# Patient Record
Sex: Male | Born: 1960 | Race: White | Hispanic: No | Marital: Married | Smoking: Never smoker
Health system: Southern US, Community
[De-identification: ages and names within clinical notes are randomized; demographics above are authoritative.]

## PROBLEM LIST (undated history)

## (undated) DIAGNOSIS — E7800 Pure hypercholesterolemia, unspecified: Secondary | ICD-10-CM

## (undated) DIAGNOSIS — E119 Type 2 diabetes mellitus without complications: Secondary | ICD-10-CM

## (undated) DIAGNOSIS — S3282XA Multiple fractures of pelvis without disruption of pelvic ring, initial encounter for closed fracture: Secondary | ICD-10-CM

## (undated) HISTORY — PX: HERNIA REPAIR: SHX51

---

## 2017-02-07 ENCOUNTER — Encounter (HOSPITAL_COMMUNITY)
Admission: EM | Disposition: A | Discharge status: Nursing Home (Includes ICF) | Payer: Self-pay | Source: Home / Self Care

## 2017-02-07 ENCOUNTER — Emergency Department (HOSPITAL_COMMUNITY): Payer: BLUE CROSS/BLUE SHIELD

## 2017-02-07 ENCOUNTER — Inpatient Hospital Stay (HOSPITAL_COMMUNITY): Payer: BLUE CROSS/BLUE SHIELD

## 2017-02-07 ENCOUNTER — Inpatient Hospital Stay (HOSPITAL_COMMUNITY): Payer: BLUE CROSS/BLUE SHIELD | Admitting: Critical Care Medicine

## 2017-02-07 ENCOUNTER — Inpatient Hospital Stay (HOSPITAL_COMMUNITY)
Admission: EM | Admit: 2017-02-07 | Discharge: 2017-03-23 | DRG: 003 | Disposition: A | Discharge status: Other Acute Inpatient Hospital | Payer: BLUE CROSS/BLUE SHIELD | Attending: General Surgery | Admitting: General Surgery

## 2017-02-07 ENCOUNTER — Inpatient Hospital Stay (HOSPITAL_COMMUNITY): Payer: BLUE CROSS/BLUE SHIELD | Admitting: Certified Registered"

## 2017-02-07 ENCOUNTER — Encounter (HOSPITAL_COMMUNITY): Payer: Self-pay

## 2017-02-07 DIAGNOSIS — G92 Toxic encephalopathy: Secondary | ICD-10-CM | POA: Diagnosis not present

## 2017-02-07 DIAGNOSIS — E119 Type 2 diabetes mellitus without complications: Secondary | ICD-10-CM

## 2017-02-07 DIAGNOSIS — R000 Tachycardia, unspecified: Secondary | ICD-10-CM | POA: Diagnosis present

## 2017-02-07 DIAGNOSIS — A499 Bacterial infection, unspecified: Secondary | ICD-10-CM | POA: Diagnosis not present

## 2017-02-07 DIAGNOSIS — L8990 Pressure ulcer of unspecified site, unspecified stage: Secondary | ICD-10-CM | POA: Insufficient documentation

## 2017-02-07 DIAGNOSIS — N179 Acute kidney failure, unspecified: Secondary | ICD-10-CM | POA: Diagnosis not present

## 2017-02-07 DIAGNOSIS — S299XXD Unspecified injury of thorax, subsequent encounter: Secondary | ICD-10-CM | POA: Diagnosis not present

## 2017-02-07 DIAGNOSIS — S37031A Laceration of right kidney, unspecified degree, initial encounter: Secondary | ICD-10-CM

## 2017-02-07 DIAGNOSIS — J9690 Respiratory failure, unspecified, unspecified whether with hypoxia or hypercapnia: Secondary | ICD-10-CM

## 2017-02-07 DIAGNOSIS — D62 Acute posthemorrhagic anemia: Secondary | ICD-10-CM | POA: Diagnosis present

## 2017-02-07 DIAGNOSIS — S32810S Multiple fractures of pelvis with stable disruption of pelvic ring, sequela: Secondary | ICD-10-CM | POA: Diagnosis not present

## 2017-02-07 DIAGNOSIS — R338 Other retention of urine: Secondary | ICD-10-CM

## 2017-02-07 DIAGNOSIS — S36031A Moderate laceration of spleen, initial encounter: Secondary | ICD-10-CM | POA: Diagnosis present

## 2017-02-07 DIAGNOSIS — R339 Retention of urine, unspecified: Secondary | ICD-10-CM | POA: Diagnosis not present

## 2017-02-07 DIAGNOSIS — T794XXA Traumatic shock, initial encounter: Secondary | ICD-10-CM

## 2017-02-07 DIAGNOSIS — S32119A Unspecified Zone I fracture of sacrum, initial encounter for closed fracture: Secondary | ICD-10-CM | POA: Diagnosis present

## 2017-02-07 DIAGNOSIS — R509 Fever, unspecified: Secondary | ICD-10-CM

## 2017-02-07 DIAGNOSIS — G8918 Other acute postprocedural pain: Secondary | ICD-10-CM

## 2017-02-07 DIAGNOSIS — I10 Essential (primary) hypertension: Secondary | ICD-10-CM | POA: Diagnosis present

## 2017-02-07 DIAGNOSIS — A419 Sepsis, unspecified organism: Secondary | ICD-10-CM | POA: Diagnosis not present

## 2017-02-07 DIAGNOSIS — J9601 Acute respiratory failure with hypoxia: Secondary | ICD-10-CM | POA: Diagnosis present

## 2017-02-07 DIAGNOSIS — S32049A Unspecified fracture of fourth lumbar vertebra, initial encounter for closed fracture: Secondary | ICD-10-CM | POA: Diagnosis present

## 2017-02-07 DIAGNOSIS — Z931 Gastrostomy status: Secondary | ICD-10-CM | POA: Diagnosis not present

## 2017-02-07 DIAGNOSIS — S2249XA Multiple fractures of ribs, unspecified side, initial encounter for closed fracture: Secondary | ICD-10-CM | POA: Diagnosis not present

## 2017-02-07 DIAGNOSIS — Z7984 Long term (current) use of oral hypoglycemic drugs: Secondary | ICD-10-CM

## 2017-02-07 DIAGNOSIS — S27322A Contusion of lung, bilateral, initial encounter: Secondary | ICD-10-CM | POA: Diagnosis not present

## 2017-02-07 DIAGNOSIS — E870 Hyperosmolality and hypernatremia: Secondary | ICD-10-CM | POA: Diagnosis not present

## 2017-02-07 DIAGNOSIS — J9503 Malfunction of tracheostomy stoma: Secondary | ICD-10-CM | POA: Diagnosis not present

## 2017-02-07 DIAGNOSIS — G8220 Paraplegia, unspecified: Secondary | ICD-10-CM | POA: Diagnosis not present

## 2017-02-07 DIAGNOSIS — Z9289 Personal history of other medical treatment: Secondary | ICD-10-CM

## 2017-02-07 DIAGNOSIS — S22009A Unspecified fracture of unspecified thoracic vertebra, initial encounter for closed fracture: Secondary | ICD-10-CM | POA: Diagnosis not present

## 2017-02-07 DIAGNOSIS — N170 Acute kidney failure with tubular necrosis: Secondary | ICD-10-CM | POA: Diagnosis not present

## 2017-02-07 DIAGNOSIS — Z9911 Dependence on respirator [ventilator] status: Secondary | ICD-10-CM | POA: Diagnosis not present

## 2017-02-07 DIAGNOSIS — E1165 Type 2 diabetes mellitus with hyperglycemia: Secondary | ICD-10-CM | POA: Diagnosis not present

## 2017-02-07 DIAGNOSIS — S069X9A Unspecified intracranial injury with loss of consciousness of unspecified duration, initial encounter: Secondary | ICD-10-CM | POA: Diagnosis not present

## 2017-02-07 DIAGNOSIS — E785 Hyperlipidemia, unspecified: Secondary | ICD-10-CM

## 2017-02-07 DIAGNOSIS — I159 Secondary hypertension, unspecified: Secondary | ICD-10-CM

## 2017-02-07 DIAGNOSIS — Z930 Tracheostomy status: Secondary | ICD-10-CM

## 2017-02-07 DIAGNOSIS — S32810A Multiple fractures of pelvis with stable disruption of pelvic ring, initial encounter for closed fracture: Secondary | ICD-10-CM

## 2017-02-07 DIAGNOSIS — E7800 Pure hypercholesterolemia, unspecified: Secondary | ICD-10-CM | POA: Diagnosis not present

## 2017-02-07 DIAGNOSIS — R571 Hypovolemic shock: Secondary | ICD-10-CM | POA: Diagnosis present

## 2017-02-07 DIAGNOSIS — E875 Hyperkalemia: Secondary | ICD-10-CM

## 2017-02-07 DIAGNOSIS — E1121 Type 2 diabetes mellitus with diabetic nephropathy: Secondary | ICD-10-CM

## 2017-02-07 DIAGNOSIS — S2243XA Multiple fractures of ribs, bilateral, initial encounter for closed fracture: Secondary | ICD-10-CM | POA: Diagnosis present

## 2017-02-07 DIAGNOSIS — R001 Bradycardia, unspecified: Secondary | ICD-10-CM | POA: Diagnosis not present

## 2017-02-07 DIAGNOSIS — J942 Hemothorax: Secondary | ICD-10-CM

## 2017-02-07 DIAGNOSIS — IMO0002 Reserved for concepts with insufficient information to code with codable children: Secondary | ICD-10-CM

## 2017-02-07 DIAGNOSIS — S32059A Unspecified fracture of fifth lumbar vertebra, initial encounter for closed fracture: Secondary | ICD-10-CM | POA: Diagnosis present

## 2017-02-07 DIAGNOSIS — Z9689 Presence of other specified functional implants: Secondary | ICD-10-CM

## 2017-02-07 DIAGNOSIS — T1490XS Injury, unspecified, sequela: Secondary | ICD-10-CM | POA: Diagnosis not present

## 2017-02-07 DIAGNOSIS — R17 Unspecified jaundice: Secondary | ICD-10-CM | POA: Diagnosis not present

## 2017-02-07 DIAGNOSIS — E8889 Other specified metabolic disorders: Secondary | ICD-10-CM | POA: Diagnosis present

## 2017-02-07 DIAGNOSIS — T07XXXA Unspecified multiple injuries, initial encounter: Secondary | ICD-10-CM | POA: Diagnosis not present

## 2017-02-07 DIAGNOSIS — S32009A Unspecified fracture of unspecified lumbar vertebra, initial encounter for closed fracture: Secondary | ICD-10-CM

## 2017-02-07 DIAGNOSIS — S37041A Minor laceration of right kidney, initial encounter: Secondary | ICD-10-CM | POA: Diagnosis present

## 2017-02-07 DIAGNOSIS — S36039A Unspecified laceration of spleen, initial encounter: Secondary | ICD-10-CM

## 2017-02-07 DIAGNOSIS — H518 Other specified disorders of binocular movement: Secondary | ICD-10-CM | POA: Diagnosis not present

## 2017-02-07 DIAGNOSIS — S272XXA Traumatic hemopneumothorax, initial encounter: Secondary | ICD-10-CM | POA: Diagnosis present

## 2017-02-07 DIAGNOSIS — H5500 Unspecified nystagmus: Secondary | ICD-10-CM | POA: Diagnosis not present

## 2017-02-07 DIAGNOSIS — E872 Acidosis: Secondary | ICD-10-CM | POA: Diagnosis not present

## 2017-02-07 DIAGNOSIS — S32029A Unspecified fracture of second lumbar vertebra, initial encounter for closed fracture: Secondary | ICD-10-CM | POA: Diagnosis present

## 2017-02-07 DIAGNOSIS — S334XXA Traumatic rupture of symphysis pubis, initial encounter: Secondary | ICD-10-CM | POA: Diagnosis present

## 2017-02-07 DIAGNOSIS — B961 Klebsiella pneumoniae [K. pneumoniae] as the cause of diseases classified elsewhere: Secondary | ICD-10-CM | POA: Diagnosis not present

## 2017-02-07 DIAGNOSIS — S3730XA Unspecified injury of urethra, initial encounter: Secondary | ICD-10-CM | POA: Diagnosis not present

## 2017-02-07 DIAGNOSIS — R7881 Bacteremia: Secondary | ICD-10-CM | POA: Diagnosis not present

## 2017-02-07 DIAGNOSIS — N501 Vascular disorders of male genital organs: Secondary | ICD-10-CM

## 2017-02-07 DIAGNOSIS — G7281 Critical illness myopathy: Secondary | ICD-10-CM | POA: Diagnosis not present

## 2017-02-07 DIAGNOSIS — L89619 Pressure ulcer of right heel, unspecified stage: Secondary | ICD-10-CM | POA: Diagnosis not present

## 2017-02-07 DIAGNOSIS — L89899 Pressure ulcer of other site, unspecified stage: Secondary | ICD-10-CM | POA: Diagnosis not present

## 2017-02-07 DIAGNOSIS — T148XXA Other injury of unspecified body region, initial encounter: Secondary | ICD-10-CM

## 2017-02-07 DIAGNOSIS — Y9279 Other farm location as the place of occurrence of the external cause: Secondary | ICD-10-CM | POA: Diagnosis not present

## 2017-02-07 DIAGNOSIS — E2749 Other adrenocortical insufficiency: Secondary | ICD-10-CM | POA: Diagnosis present

## 2017-02-07 DIAGNOSIS — R6521 Severe sepsis with septic shock: Secondary | ICD-10-CM | POA: Diagnosis not present

## 2017-02-07 DIAGNOSIS — Z79899 Other long term (current) drug therapy: Secondary | ICD-10-CM

## 2017-02-07 DIAGNOSIS — N360 Urethral fistula: Secondary | ICD-10-CM | POA: Diagnosis not present

## 2017-02-07 DIAGNOSIS — S3282XD Multiple fractures of pelvis without disruption of pelvic ring, subsequent encounter for fracture with routine healing: Secondary | ICD-10-CM | POA: Diagnosis not present

## 2017-02-07 DIAGNOSIS — E559 Vitamin D deficiency, unspecified: Secondary | ICD-10-CM | POA: Diagnosis present

## 2017-02-07 DIAGNOSIS — S32039A Unspecified fracture of third lumbar vertebra, initial encounter for closed fracture: Secondary | ICD-10-CM | POA: Diagnosis present

## 2017-02-07 DIAGNOSIS — J982 Interstitial emphysema: Secondary | ICD-10-CM

## 2017-02-07 DIAGNOSIS — S069X3S Unspecified intracranial injury with loss of consciousness of 1 hour to 5 hours 59 minutes, sequela: Secondary | ICD-10-CM | POA: Diagnosis not present

## 2017-02-07 DIAGNOSIS — B952 Enterococcus as the cause of diseases classified elsewhere: Secondary | ICD-10-CM | POA: Diagnosis not present

## 2017-02-07 DIAGNOSIS — R569 Unspecified convulsions: Secondary | ICD-10-CM | POA: Diagnosis not present

## 2017-02-07 DIAGNOSIS — Z9359 Other cystostomy status: Secondary | ICD-10-CM | POA: Diagnosis not present

## 2017-02-07 DIAGNOSIS — R4182 Altered mental status, unspecified: Secondary | ICD-10-CM | POA: Diagnosis not present

## 2017-02-07 DIAGNOSIS — N5089 Other specified disorders of the male genital organs: Secondary | ICD-10-CM | POA: Diagnosis not present

## 2017-02-07 DIAGNOSIS — N390 Urinary tract infection, site not specified: Secondary | ICD-10-CM | POA: Diagnosis not present

## 2017-02-07 DIAGNOSIS — S299XXA Unspecified injury of thorax, initial encounter: Secondary | ICD-10-CM

## 2017-02-07 DIAGNOSIS — E1169 Type 2 diabetes mellitus with other specified complication: Secondary | ICD-10-CM | POA: Diagnosis present

## 2017-02-07 DIAGNOSIS — Z0189 Encounter for other specified special examinations: Secondary | ICD-10-CM

## 2017-02-07 DIAGNOSIS — S270XXS Traumatic pneumothorax, sequela: Secondary | ICD-10-CM | POA: Diagnosis not present

## 2017-02-07 DIAGNOSIS — S3282XA Multiple fractures of pelvis without disruption of pelvic ring, initial encounter for closed fracture: Secondary | ICD-10-CM | POA: Diagnosis present

## 2017-02-07 DIAGNOSIS — S37091A Other injury of right kidney, initial encounter: Secondary | ICD-10-CM

## 2017-02-07 DIAGNOSIS — Z419 Encounter for procedure for purposes other than remedying health state, unspecified: Secondary | ICD-10-CM

## 2017-02-07 DIAGNOSIS — S332XXA Dislocation of sacroiliac and sacrococcygeal joint, initial encounter: Secondary | ICD-10-CM | POA: Diagnosis present

## 2017-02-07 DIAGNOSIS — J939 Pneumothorax, unspecified: Secondary | ICD-10-CM

## 2017-02-07 DIAGNOSIS — S06303S Unspecified focal traumatic brain injury with loss of consciousness of 1 hour to 5 hours 59 minutes, sequela: Secondary | ICD-10-CM | POA: Diagnosis not present

## 2017-02-07 DIAGNOSIS — R069 Unspecified abnormalities of breathing: Secondary | ICD-10-CM

## 2017-02-07 DIAGNOSIS — S32810D Multiple fractures of pelvis with stable disruption of pelvic ring, subsequent encounter for fracture with routine healing: Secondary | ICD-10-CM | POA: Diagnosis not present

## 2017-02-07 DIAGNOSIS — R0602 Shortness of breath: Secondary | ICD-10-CM

## 2017-02-07 DIAGNOSIS — E876 Hypokalemia: Secondary | ICD-10-CM | POA: Diagnosis not present

## 2017-02-07 DIAGNOSIS — D6489 Other specified anemias: Secondary | ICD-10-CM | POA: Diagnosis not present

## 2017-02-07 DIAGNOSIS — Z23 Encounter for immunization: Secondary | ICD-10-CM | POA: Diagnosis not present

## 2017-02-07 DIAGNOSIS — S22069S Unspecified fracture of T7-T8 vertebra, sequela: Secondary | ICD-10-CM | POA: Diagnosis not present

## 2017-02-07 DIAGNOSIS — Z9889 Other specified postprocedural states: Secondary | ICD-10-CM | POA: Diagnosis not present

## 2017-02-07 DIAGNOSIS — D6959 Other secondary thrombocytopenia: Secondary | ICD-10-CM | POA: Diagnosis not present

## 2017-02-07 DIAGNOSIS — G6281 Critical illness polyneuropathy: Secondary | ICD-10-CM | POA: Diagnosis not present

## 2017-02-07 DIAGNOSIS — J9811 Atelectasis: Secondary | ICD-10-CM | POA: Diagnosis present

## 2017-02-07 DIAGNOSIS — R0902 Hypoxemia: Secondary | ICD-10-CM

## 2017-02-07 DIAGNOSIS — R0682 Tachypnea, not elsewhere classified: Secondary | ICD-10-CM

## 2017-02-07 DIAGNOSIS — Z794 Long term (current) use of insulin: Secondary | ICD-10-CM

## 2017-02-07 DIAGNOSIS — Z938 Other artificial opening status: Secondary | ICD-10-CM

## 2017-02-07 HISTORY — DX: Multiple fractures of pelvis without disruption of pelvic ring, initial encounter for closed fracture: S32.82XA

## 2017-02-07 HISTORY — PX: EXTERNAL FIXATION PELVIS: SHX1551

## 2017-02-07 HISTORY — PX: IR ANGIOGRAM SELECTIVE EACH ADDITIONAL VESSEL: IMG667

## 2017-02-07 HISTORY — PX: IR ANGIOGRAM PELVIS SELECTIVE OR SUPRASELECTIVE: IMG661

## 2017-02-07 HISTORY — DX: Type 2 diabetes mellitus without complications: E11.9

## 2017-02-07 HISTORY — DX: Pure hypercholesterolemia, unspecified: E78.00

## 2017-02-07 HISTORY — PX: RADIOLOGY WITH ANESTHESIA: SHX6223

## 2017-02-07 HISTORY — PX: IR ANGIOGRAM VISCERAL SELECTIVE: IMG657

## 2017-02-07 HISTORY — PX: IR EMBO ART  VEN HEMORR LYMPH EXTRAV  INC GUIDE ROADMAPPING: IMG5450

## 2017-02-07 HISTORY — PX: SACROILIAC JOINT FUSION: SHX6088

## 2017-02-07 HISTORY — PX: IR US GUIDE VASC ACCESS RIGHT: IMG2390

## 2017-02-07 LAB — GLUCOSE, CAPILLARY
GLUCOSE-CAPILLARY: 370 mg/dL — AB (ref 65–99)
GLUCOSE-CAPILLARY: 196 mg/dL — AB (ref 65–99)
GLUCOSE-CAPILLARY: 151 mg/dL — AB (ref 65–99)
GLUCOSE-CAPILLARY: 102 mg/dL — AB (ref 65–99)
Glucose-Capillary: 280 mg/dL — ABNORMAL HIGH (ref 65–99)
Glucose-Capillary: 271 mg/dL — ABNORMAL HIGH (ref 65–99)
Glucose-Capillary: 226 mg/dL — ABNORMAL HIGH (ref 65–99)
Glucose-Capillary: 130 mg/dL — ABNORMAL HIGH (ref 65–99)

## 2017-02-07 LAB — POCT I-STAT 7, (LYTES, BLD GAS, ICA,H+H)
ACID-BASE DEFICIT: 7 mmol/L — AB (ref 0.0–2.0)
Bicarbonate: 19.5 mmol/L — ABNORMAL LOW (ref 20.0–28.0)
CALCIUM ION: 0.97 mmol/L — AB (ref 1.15–1.40)
HCT: 32 % — ABNORMAL LOW (ref 39.0–52.0)
HEMOGLOBIN: 10.9 g/dL — AB (ref 13.0–17.0)
O2 SAT: 94 %
PH ART: 7.276 — AB (ref 7.350–7.450)
PO2 ART: 79 mmHg — AB (ref 83.0–108.0)
Potassium: 3.3 mmol/L — ABNORMAL LOW (ref 3.5–5.1)
SODIUM: 140 mmol/L (ref 135–145)
TCO2: 21 mmol/L (ref 0–100)
pCO2 arterial: 41.9 mmHg (ref 32.0–48.0)

## 2017-02-07 LAB — COMPREHENSIVE METABOLIC PANEL
ALK PHOS: 54 U/L (ref 38–126)
ALT: 277 U/L — ABNORMAL HIGH (ref 17–63)
ANION GAP: 10 (ref 5–15)
AST: 326 U/L — ABNORMAL HIGH (ref 15–41)
Albumin: 3 g/dL — ABNORMAL LOW (ref 3.5–5.0)
BUN: 19 mg/dL (ref 6–20)
CALCIUM: 7.7 mg/dL — AB (ref 8.9–10.3)
CHLORIDE: 103 mmol/L (ref 101–111)
CO2: 22 mmol/L (ref 22–32)
Creatinine, Ser: 1.54 mg/dL — ABNORMAL HIGH (ref 0.61–1.24)
GFR calc non Af Amer: 49 mL/min — ABNORMAL LOW (ref >60)
GFR, EST AFRICAN AMERICAN: 57 mL/min — AB (ref >60)
Glucose, Bld: 496 mg/dL — ABNORMAL HIGH (ref 65–99)
Potassium: 3.3 mmol/L — ABNORMAL LOW (ref 3.5–5.1)
SODIUM: 135 mmol/L (ref 135–145)
Total Bilirubin: 0.8 mg/dL (ref 0.3–1.2)
Total Protein: 4.9 g/dL — ABNORMAL LOW (ref 6.5–8.1)

## 2017-02-07 LAB — BPAM PLATELET PHERESIS
Blood Product Expiration Date: 201808212359
Unit Type and Rh: 6200

## 2017-02-07 LAB — I-STAT CHEM 8, ED
BUN: 24 mg/dL — ABNORMAL HIGH (ref 6–20)
Calcium, Ion: 1.05 mmol/L — ABNORMAL LOW (ref 1.15–1.40)
Chloride: 99 mmol/L — ABNORMAL LOW (ref 101–111)
Creatinine, Ser: 1.3 mg/dL — ABNORMAL HIGH (ref 0.61–1.24)
Glucose, Bld: 501 mg/dL (ref 65–99)
HCT: 33 % — ABNORMAL LOW (ref 39.0–52.0)
Hemoglobin: 11.2 g/dL — ABNORMAL LOW (ref 13.0–17.0)
Potassium: 3.5 mmol/L (ref 3.5–5.1)
Sodium: 138 mmol/L (ref 135–145)
TCO2: 25 mmol/L (ref 0–100)

## 2017-02-07 LAB — URINALYSIS, ROUTINE W REFLEX MICROSCOPIC
BILIRUBIN URINE: NEGATIVE
KETONES UR: NEGATIVE mg/dL
LEUKOCYTES UA: NEGATIVE
NITRITE: NEGATIVE
PH: 6 (ref 5.0–8.0)
Protein, ur: 100 mg/dL — AB
SQUAMOUS EPITHELIAL / LPF: NONE SEEN
Specific Gravity, Urine: 1.014 (ref 1.005–1.030)
WBC, UA: NONE SEEN WBC/hpf (ref 0–5)

## 2017-02-07 LAB — I-STAT ARTERIAL BLOOD GAS, ED
Acid-base deficit: 4 mmol/L — ABNORMAL HIGH (ref 0.0–2.0)
BICARBONATE: 21.7 mmol/L (ref 20.0–28.0)
O2 SAT: 99 %
TCO2: 23 mmol/L (ref 0–100)
pCO2 arterial: 39 mmHg (ref 32.0–48.0)
pH, Arterial: 7.347 — ABNORMAL LOW (ref 7.350–7.450)
pO2, Arterial: 153 mmHg — ABNORMAL HIGH (ref 83.0–108.0)

## 2017-02-07 LAB — BLOOD GAS, ARTERIAL
ACID-BASE DEFICIT: 3.3 mmol/L — AB (ref 0.0–2.0)
BICARBONATE: 22.3 mmol/L (ref 20.0–28.0)
FIO2: 100
LHR: 20 {breaths}/min
O2 SAT: 99.3 %
PATIENT TEMPERATURE: 98.6
PCO2 ART: 48.1 mmHg — AB (ref 32.0–48.0)
PEEP/CPAP: 8 cmH2O
VT: 580 mL
pH, Arterial: 7.287 — ABNORMAL LOW (ref 7.350–7.450)
pO2, Arterial: 191 mmHg — ABNORMAL HIGH (ref 83.0–108.0)

## 2017-02-07 LAB — CBC
HCT: 34.8 % — ABNORMAL LOW (ref 39.0–52.0)
HCT: 27.7 % — ABNORMAL LOW (ref 39.0–52.0)
HCT: 27.2 % — ABNORMAL LOW (ref 39.0–52.0)
HEMOGLOBIN: 9.4 g/dL — AB (ref 13.0–17.0)
Hemoglobin: 11.6 g/dL — ABNORMAL LOW (ref 13.0–17.0)
Hemoglobin: 9.4 g/dL — ABNORMAL LOW (ref 13.0–17.0)
MCH: 28.8 pg (ref 26.0–34.0)
MCH: 29.1 pg (ref 26.0–34.0)
MCH: 29.4 pg (ref 26.0–34.0)
MCHC: 33.3 g/dL (ref 30.0–36.0)
MCHC: 33.9 g/dL (ref 30.0–36.0)
MCHC: 34.6 g/dL (ref 30.0–36.0)
MCV: 86.4 fL (ref 78.0–100.0)
MCV: 85.8 fL (ref 78.0–100.0)
MCV: 85 fL (ref 78.0–100.0)
PLATELETS: 120 10*3/uL — AB (ref 150–400)
PLATELETS: 72 10*3/uL — AB (ref 150–400)
Platelets: 251 10*3/uL (ref 150–400)
RBC: 4.03 MIL/uL — ABNORMAL LOW (ref 4.22–5.81)
RBC: 3.23 MIL/uL — ABNORMAL LOW (ref 4.22–5.81)
RBC: 3.2 MIL/uL — AB (ref 4.22–5.81)
RDW: 12.8 % (ref 11.5–15.5)
RDW: 13.8 % (ref 11.5–15.5)
RDW: 13.9 % (ref 11.5–15.5)
WBC: 19 10*3/uL — ABNORMAL HIGH (ref 4.0–10.5)
WBC: 10.3 10*3/uL (ref 4.0–10.5)
WBC: 5.5 10*3/uL (ref 4.0–10.5)

## 2017-02-07 LAB — PROTIME-INR
INR: 1.08
Prothrombin Time: 14.1 seconds (ref 11.4–15.2)

## 2017-02-07 LAB — DIC (DISSEMINATED INTRAVASCULAR COAGULATION) PANEL
APTT: 33 s (ref 24–36)
FIBRINOGEN: 168 mg/dL — AB (ref 210–475)
INR: 1.3
PLATELETS: 124 10*3/uL — AB (ref 150–400)

## 2017-02-07 LAB — POCT I-STAT 4, (NA,K, GLUC, HGB,HCT)
GLUCOSE: 420 mg/dL — AB (ref 65–99)
Glucose, Bld: 495 mg/dL — ABNORMAL HIGH (ref 65–99)
HCT: 29 % — ABNORMAL LOW (ref 39.0–52.0)
HEMATOCRIT: 32 % — AB (ref 39.0–52.0)
HEMOGLOBIN: 9.9 g/dL — AB (ref 13.0–17.0)
Hemoglobin: 10.9 g/dL — ABNORMAL LOW (ref 13.0–17.0)
POTASSIUM: 3.3 mmol/L — AB (ref 3.5–5.1)
Potassium: 3.1 mmol/L — ABNORMAL LOW (ref 3.5–5.1)
SODIUM: 141 mmol/L (ref 135–145)
Sodium: 142 mmol/L (ref 135–145)

## 2017-02-07 LAB — PREPARE PLATELET PHERESIS: UNIT DIVISION: 0

## 2017-02-07 LAB — CDS SEROLOGY

## 2017-02-07 LAB — HEMOGLOBIN A1C
HEMOGLOBIN A1C: 8.6 % — AB (ref 4.8–5.6)
MEAN PLASMA GLUCOSE: 200.12 mg/dL

## 2017-02-07 LAB — PREPARE RBC (CROSSMATCH)

## 2017-02-07 LAB — TRIGLYCERIDES: Triglycerides: 74 mg/dL (ref <150)

## 2017-02-07 LAB — DIC (DISSEMINATED INTRAVASCULAR COAGULATION)PANEL
Prothrombin Time: 16.3 seconds — ABNORMAL HIGH (ref 11.4–15.2)
Smear Review: NONE SEEN

## 2017-02-07 LAB — I-STAT CG4 LACTIC ACID, ED: Lactic Acid, Venous: 3.65 mmol/L (ref 0.5–1.9)

## 2017-02-07 LAB — CBG MONITORING, ED: GLUCOSE-CAPILLARY: 461 mg/dL — AB (ref 65–99)

## 2017-02-07 LAB — FIBRINOGEN: FIBRINOGEN: 154 mg/dL — AB (ref 210–475)

## 2017-02-07 LAB — LACTIC ACID, PLASMA
Lactic Acid, Venous: 5 mmol/L (ref 0.5–1.9)
Lactic Acid, Venous: 4.4 mmol/L (ref 0.5–1.9)
Lactic Acid, Venous: 2.8 mmol/L (ref 0.5–1.9)

## 2017-02-07 LAB — ETHANOL: Alcohol, Ethyl (B): <5 mg/dL

## 2017-02-07 LAB — MASSIVE TRANSFUSION PROTOCOL ORDER (BLOOD BANK NOTIFICATION)

## 2017-02-07 SURGERY — RADIOLOGY WITH ANESTHESIA
Anesthesia: General

## 2017-02-07 SURGERY — EXTERNAL FIXATION, PELVIS
Anesthesia: General

## 2017-02-07 MED ORDER — SODIUM CHLORIDE 0.9 % IV SOLN
INTRAVENOUS | Status: DC
  Administered 2017-02-07: 14.8 [IU]/h via INTRAVENOUS
  Filled 2017-02-07: qty 1

## 2017-02-07 MED ORDER — SODIUM CHLORIDE 0.9% FLUSH
10.0000 mL | Freq: Two times a day (BID) | INTRAVENOUS | Status: DC
  Administered 2017-02-07 – 2017-02-08 (×3): 10 mL
  Administered 2017-02-09: 20 mL
  Administered 2017-02-09 – 2017-02-10 (×2): 10 mL
  Administered 2017-02-10: 20 mL
  Administered 2017-02-11 – 2017-03-04 (×35): 10 mL

## 2017-02-07 MED ORDER — GELATIN ABSORBABLE 12-7 MM EX MISC
CUTANEOUS | Status: AC
  Filled 2017-02-07: qty 1

## 2017-02-07 MED ORDER — SODIUM BICARBONATE 8.4 % IV SOLN
INTRAVENOUS | Status: DC | PRN
  Administered 2017-02-07: 50 meq via INTRAVENOUS

## 2017-02-07 MED ORDER — VECURONIUM BROMIDE 10 MG IV SOLR
INTRAVENOUS | Status: AC
  Filled 2017-02-07: qty 10

## 2017-02-07 MED ORDER — PANTOPRAZOLE SODIUM 40 MG IV SOLR
40.0000 mg | Freq: Every day | INTRAVENOUS | Status: DC
  Administered 2017-02-08 – 2017-02-11 (×4): 40 mg via INTRAVENOUS
  Filled 2017-02-07 (×4): qty 40

## 2017-02-07 MED ORDER — FENTANYL 2500MCG IN NS 250ML (10MCG/ML) PREMIX INFUSION
25.0000 ug/h | INTRAVENOUS | Status: DC
  Administered 2017-02-07: 200 ug/h via INTRAVENOUS
  Administered 2017-02-08: 400 ug/h via INTRAVENOUS
  Administered 2017-02-08: 350 ug/h via INTRAVENOUS
  Administered 2017-02-08: 200 ug/h via INTRAVENOUS
  Administered 2017-02-09 (×4): 400 ug/h via INTRAVENOUS
  Administered 2017-02-10: 250 ug/h via INTRAVENOUS
  Administered 2017-02-10 (×2): 400 ug/h via INTRAVENOUS
  Administered 2017-02-11: 100 ug/h via INTRAVENOUS
  Administered 2017-02-11: 250 ug/h via INTRAVENOUS
  Administered 2017-02-12 (×2): 175 ug/h via INTRAVENOUS
  Administered 2017-02-13 (×2): 200 ug/h via INTRAVENOUS
  Administered 2017-02-14: 225 ug/h via INTRAVENOUS
  Administered 2017-02-14: 300 ug/h via INTRAVENOUS
  Administered 2017-02-14: 325 ug/h via INTRAVENOUS
  Administered 2017-02-14: 300 ug/h via INTRAVENOUS
  Administered 2017-02-15: 150 ug/h via INTRAVENOUS
  Administered 2017-02-15 – 2017-02-16 (×5): 300 ug/h via INTRAVENOUS
  Administered 2017-02-17: 200 ug/h via INTRAVENOUS
  Administered 2017-02-17: 300 ug/h via INTRAVENOUS
  Administered 2017-02-18 – 2017-02-19 (×3): 250 ug/h via INTRAVENOUS
  Administered 2017-02-19 (×2): 300 ug/h via INTRAVENOUS
  Administered 2017-02-20: 200 ug/h via INTRAVENOUS
  Administered 2017-02-20: 300 ug/h via INTRAVENOUS
  Administered 2017-02-21 – 2017-02-23 (×4): 250 ug/h via INTRAVENOUS
  Administered 2017-02-23: 300 ug/h via INTRAVENOUS
  Administered 2017-02-24 (×2): 200 ug/h via INTRAVENOUS
  Administered 2017-02-25: 225 ug/h via INTRAVENOUS
  Administered 2017-02-26 – 2017-03-01 (×3): 50 ug/h via INTRAVENOUS
  Filled 2017-02-07 (×53): qty 250

## 2017-02-07 MED ORDER — PROPOFOL 1000 MG/100ML IV EMUL
INTRAVENOUS | Status: AC | PRN
  Administered 2017-02-07: 50 ug/kg/min via INTRAVENOUS

## 2017-02-07 MED ORDER — CHLORHEXIDINE GLUCONATE 0.12% ORAL RINSE (MEDLINE KIT)
15.0000 mL | Freq: Two times a day (BID) | OROMUCOSAL | Status: DC
  Administered 2017-02-07 – 2017-03-05 (×52): 15 mL via OROMUCOSAL

## 2017-02-07 MED ORDER — SODIUM CHLORIDE 0.9 % IV SOLN
INTRAVENOUS | Status: DC | PRN
  Administered 2017-02-07: 9.3 [IU]/h via INTRAVENOUS

## 2017-02-07 MED ORDER — ROCURONIUM BROMIDE 10 MG/ML (PF) SYRINGE
PREFILLED_SYRINGE | INTRAVENOUS | Status: AC
  Filled 2017-02-07: qty 5

## 2017-02-07 MED ORDER — CEFAZOLIN SODIUM-DEXTROSE 1-4 GM/50ML-% IV SOLN
1.0000 g | Freq: Three times a day (TID) | INTRAVENOUS | Status: AC
  Administered 2017-02-07 – 2017-02-08 (×3): 1 g via INTRAVENOUS
  Filled 2017-02-07 (×3): qty 50

## 2017-02-07 MED ORDER — SODIUM CHLORIDE 0.9 % IV SOLN
INTRAVENOUS | Status: DC
  Filled 2017-02-07: qty 1

## 2017-02-07 MED ORDER — IOPAMIDOL (ISOVUE-300) INJECTION 61%
INTRAVENOUS | Status: AC
  Administered 2017-02-07: 75 mL
  Filled 2017-02-07: qty 100

## 2017-02-07 MED ORDER — CEFAZOLIN SODIUM 1 G IJ SOLR
INTRAMUSCULAR | Status: AC
  Filled 2017-02-07: qty 20

## 2017-02-07 MED ORDER — SODIUM CHLORIDE 0.9 % IV SOLN
INTRAVENOUS | Status: DC | PRN
  Administered 2017-02-07: 8.7 [IU]/h via INTRAVENOUS

## 2017-02-07 MED ORDER — CEFAZOLIN SODIUM-DEXTROSE 2-3 GM-% IV SOLR
INTRAVENOUS | Status: DC | PRN
  Administered 2017-02-07: 2 g via INTRAVENOUS

## 2017-02-07 MED ORDER — ONDANSETRON 4 MG PO TBDP: 4.0000 mg | ORAL_TABLET | Freq: Four times a day (QID) | ORAL | Status: DC | PRN

## 2017-02-07 MED ORDER — ETOMIDATE 2 MG/ML IV SOLN
INTRAVENOUS | Status: AC | PRN
  Administered 2017-02-07: 20 mg via INTRAVENOUS

## 2017-02-07 MED ORDER — ALBUMIN HUMAN 5 % IV SOLN
INTRAVENOUS | Status: DC | PRN
  Administered 2017-02-07: 15:00:00 via INTRAVENOUS

## 2017-02-07 MED ORDER — PANTOPRAZOLE SODIUM 40 MG PO TBEC: 40.0000 mg | DELAYED_RELEASE_TABLET | Freq: Every day | ORAL | Status: DC

## 2017-02-07 MED ORDER — ALBUMIN HUMAN 5 % IV SOLN
INTRAVENOUS | Status: DC | PRN
  Administered 2017-02-07: 13:00:00 via INTRAVENOUS

## 2017-02-07 MED ORDER — 0.9 % SODIUM CHLORIDE (POUR BTL) OPTIME
TOPICAL | Status: DC | PRN
  Administered 2017-02-07: 1000 mL

## 2017-02-07 MED ORDER — PROPOFOL 10 MG/ML IV BOLUS
INTRAVENOUS | Status: DC | PRN
  Administered 2017-02-07: 30 mg via INTRAVENOUS

## 2017-02-07 MED ORDER — FENTANYL CITRATE (PF) 100 MCG/2ML IJ SOLN
INTRAMUSCULAR | Status: AC | PRN
  Administered 2017-02-07: 50 ug via INTRAVENOUS
  Administered 2017-02-07: 25 ug via INTRAVENOUS

## 2017-02-07 MED ORDER — ROCURONIUM BROMIDE 10 MG/ML (PF) SYRINGE
PREFILLED_SYRINGE | INTRAVENOUS | Status: DC | PRN
  Administered 2017-02-07: 40 mg via INTRAVENOUS

## 2017-02-07 MED ORDER — INSULIN ASPART 100 UNIT/ML ~~LOC~~ SOLN: 0.0000 [IU] | SUBCUTANEOUS | Status: DC

## 2017-02-07 MED ORDER — HYDRALAZINE HCL 20 MG/ML IJ SOLN
10.0000 mg | INTRAMUSCULAR | Status: DC | PRN
  Administered 2017-02-18 – 2017-03-08 (×2): 10 mg via INTRAVENOUS
  Filled 2017-02-07 (×2): qty 1

## 2017-02-07 MED ORDER — PHENYLEPHRINE 40 MCG/ML (10ML) SYRINGE FOR IV PUSH (FOR BLOOD PRESSURE SUPPORT)
PREFILLED_SYRINGE | INTRAVENOUS | Status: DC | PRN
  Administered 2017-02-07: 160 ug via INTRAVENOUS
  Administered 2017-02-07: 80 ug via INTRAVENOUS

## 2017-02-07 MED ORDER — SODIUM BICARBONATE 8.4 % IV SOLN
50.0000 meq | Freq: Once | INTRAVENOUS | Status: AC
  Administered 2017-02-07: 50 meq via INTRAVENOUS
  Filled 2017-02-07: qty 50

## 2017-02-07 MED ORDER — SODIUM CHLORIDE 0.9 % IV SOLN
Freq: Once | INTRAVENOUS | Status: AC
  Administered 2017-02-07: 18:00:00 via INTRAVENOUS

## 2017-02-07 MED ORDER — STERILE WATER FOR INJECTION IJ SOLN
INTRAMUSCULAR | Status: AC
  Filled 2017-02-07: qty 10

## 2017-02-07 MED ORDER — POTASSIUM CHLORIDE 10 MEQ/50ML IV SOLN
10.0000 meq | INTRAVENOUS | Status: AC
  Administered 2017-02-07 (×4): 10 meq via INTRAVENOUS
  Filled 2017-02-07 (×4): qty 50

## 2017-02-07 MED ORDER — POTASSIUM CHLORIDE 10 MEQ/50ML IV SOLN
10.0000 meq | Freq: Once | INTRAVENOUS | Status: AC
  Administered 2017-02-07: 10 meq via INTRAVENOUS
  Filled 2017-02-07: qty 50

## 2017-02-07 MED ORDER — INSULIN ASPART 100 UNIT/ML ~~LOC~~ SOLN
8.0000 [IU] | Freq: Once | SUBCUTANEOUS | Status: AC
  Administered 2017-02-07: 8 [IU] via INTRAVENOUS
  Filled 2017-02-07: qty 1

## 2017-02-07 MED ORDER — VECURONIUM BROMIDE 10 MG IV SOLR
INTRAVENOUS | Status: AC | PRN
  Administered 2017-02-07 (×2): 10 mg via INTRAVENOUS

## 2017-02-07 MED ORDER — ORAL CARE MOUTH RINSE: 15.0000 mL | Freq: Four times a day (QID) | OROMUCOSAL | Status: DC

## 2017-02-07 MED ORDER — CHLORHEXIDINE GLUCONATE CLOTH 2 % EX PADS
6.0000 | MEDICATED_PAD | Freq: Every day | CUTANEOUS | Status: DC
  Administered 2017-02-08 – 2017-03-02 (×23): 6 via TOPICAL

## 2017-02-07 MED ORDER — SUCCINYLCHOLINE CHLORIDE 20 MG/ML IJ SOLN
INTRAMUSCULAR | Status: AC | PRN
  Administered 2017-02-07: 100 mg via INTRAVENOUS

## 2017-02-07 MED ORDER — ONDANSETRON HCL 4 MG/2ML IJ SOLN: 4.0000 mg | Freq: Four times a day (QID) | INTRAMUSCULAR | Status: DC | PRN

## 2017-02-07 MED ORDER — PHENYLEPHRINE 40 MCG/ML (10ML) SYRINGE FOR IV PUSH (FOR BLOOD PRESSURE SUPPORT)
PREFILLED_SYRINGE | INTRAVENOUS | Status: AC
  Filled 2017-02-07: qty 10

## 2017-02-07 MED ORDER — SODIUM CHLORIDE 0.45 % IV SOLN: INTRAVENOUS | Status: DC

## 2017-02-07 MED ORDER — ALBUMIN HUMAN 5 % IV SOLN
25.0000 g | Freq: Once | INTRAVENOUS | Status: AC
  Administered 2017-02-07: 25 g via INTRAVENOUS
  Filled 2017-02-07: qty 500

## 2017-02-07 MED ORDER — ORAL CARE MOUTH RINSE
15.0000 mL | OROMUCOSAL | Status: DC
  Administered 2017-02-07 – 2017-03-07 (×289): 15 mL via OROMUCOSAL

## 2017-02-07 MED ORDER — LACTATED RINGERS IV SOLN
INTRAVENOUS | Status: DC | PRN
  Administered 2017-02-07: 11:00:00 via INTRAVENOUS

## 2017-02-07 MED ORDER — PROPOFOL 1000 MG/100ML IV EMUL
INTRAVENOUS | Status: AC
  Filled 2017-02-07: qty 100

## 2017-02-07 MED ORDER — TRANEXAMIC ACID 1000 MG/10ML IV SOLN
1000.0000 mg | Freq: Once | INTRAVENOUS | Status: AC
  Filled 2017-02-07: qty 10

## 2017-02-07 MED ORDER — NOREPINEPHRINE BITARTRATE 1 MG/ML IV SOLN
0.0000 ug/min | INTRAVENOUS | Status: AC
  Filled 2017-02-07: qty 4

## 2017-02-07 MED ORDER — SODIUM CHLORIDE 0.9 % IV SOLN
INTRAVENOUS | Status: DC
  Filled 2017-02-07 (×2): qty 1000

## 2017-02-07 MED ORDER — SODIUM CHLORIDE 0.9 % IV SOLN
1.0000 g | Freq: Once | INTRAVENOUS | Status: AC
Start: 2017-02-07 — End: 2017-02-07
  Administered 2017-02-07: 1 g via INTRAVENOUS
  Filled 2017-02-07: qty 10

## 2017-02-07 MED ORDER — MIDAZOLAM HCL 2 MG/2ML IJ SOLN: 2.0000 mg | INTRAMUSCULAR | Status: DC | PRN

## 2017-02-07 MED ORDER — SODIUM CHLORIDE 0.9 % IV SOLN
Freq: Once | INTRAVENOUS | Status: AC
  Administered 2017-02-07: 23:00:00 via INTRAVENOUS

## 2017-02-07 MED ORDER — IOPAMIDOL (ISOVUE-300) INJECTION 61%
INTRAVENOUS | Status: AC
  Administered 2017-02-07: 20 mL
  Filled 2017-02-07: qty 150

## 2017-02-07 MED ORDER — MIDAZOLAM HCL 2 MG/2ML IJ SOLN
INTRAMUSCULAR | Status: AC
  Filled 2017-02-07: qty 2

## 2017-02-07 MED ORDER — SODIUM CHLORIDE 0.9 % IV SOLN
Freq: Once | INTRAVENOUS | Status: AC
  Administered 2017-02-07: 14:00:00 via INTRAVENOUS

## 2017-02-07 MED ORDER — ROCURONIUM BROMIDE 10 MG/ML (PF) SYRINGE
PREFILLED_SYRINGE | INTRAVENOUS | Status: DC | PRN
  Administered 2017-02-07: 60 mg via INTRAVENOUS
  Administered 2017-02-07: 40 mg via INTRAVENOUS

## 2017-02-07 MED ORDER — FENTANYL BOLUS VIA INFUSION
50.0000 ug | INTRAVENOUS | Status: DC | PRN
  Administered 2017-02-09 – 2017-02-28 (×16): 50 ug via INTRAVENOUS
  Filled 2017-02-07: qty 50

## 2017-02-07 MED ORDER — PROPOFOL 10 MG/ML IV BOLUS
INTRAVENOUS | Status: AC
  Filled 2017-02-07: qty 20

## 2017-02-07 MED ORDER — PROPOFOL 1000 MG/100ML IV EMUL
0.0000 ug/kg/min | INTRAVENOUS | Status: DC
  Administered 2017-02-07 (×2): 20 ug/kg/min via INTRAVENOUS
  Administered 2017-02-07 – 2017-02-08 (×2): 40 ug/kg/min via INTRAVENOUS
  Administered 2017-02-08: 25 ug/kg/min via INTRAVENOUS
  Administered 2017-02-08 (×2): 50 ug/kg/min via INTRAVENOUS
  Administered 2017-02-09: 30 ug/kg/min via INTRAVENOUS
  Administered 2017-02-09: 35 ug/kg/min via INTRAVENOUS
  Administered 2017-02-09: 40 ug/kg/min via INTRAVENOUS
  Administered 2017-02-09 – 2017-02-10 (×3): 45 ug/kg/min via INTRAVENOUS
  Filled 2017-02-07 (×12): qty 100

## 2017-02-07 MED ORDER — IOPAMIDOL (ISOVUE-M 300) INJECTION 61%: 15.0000 mL | Freq: Once | INTRAMUSCULAR | Status: DC | PRN

## 2017-02-07 MED ORDER — MIDAZOLAM HCL 5 MG/5ML IJ SOLN
INTRAMUSCULAR | Status: DC | PRN
  Administered 2017-02-07 (×2): 2 mg via INTRAVENOUS

## 2017-02-07 MED ORDER — IOPAMIDOL (ISOVUE-300) INJECTION 61%
INTRAVENOUS | Status: AC
  Administered 2017-02-07: 25 mL
  Filled 2017-02-07: qty 100

## 2017-02-07 MED ORDER — MIDAZOLAM HCL 2 MG/2ML IJ SOLN
INTRAMUSCULAR | Status: DC | PRN
  Administered 2017-02-07: 2 mg via INTRAVENOUS

## 2017-02-07 MED ORDER — POTASSIUM CHLORIDE IN NACL 20-0.9 MEQ/L-% IV SOLN
INTRAVENOUS | Status: DC
  Administered 2017-02-07 – 2017-02-10 (×4): via INTRAVENOUS
  Filled 2017-02-07 (×6): qty 1000

## 2017-02-07 MED ORDER — SODIUM CHLORIDE 0.9% FLUSH: 10.0000 mL | INTRAVENOUS | Status: DC | PRN

## 2017-02-07 MED ORDER — FENTANYL CITRATE (PF) 250 MCG/5ML IJ SOLN
INTRAMUSCULAR | Status: DC | PRN
  Administered 2017-02-07: 50 ug via INTRAVENOUS
  Administered 2017-02-07: 25 ug via INTRAVENOUS

## 2017-02-07 MED ORDER — TRANEXAMIC ACID 1000 MG/10ML IV SOLN
1000.0000 mg | Freq: Once | INTRAVENOUS | Status: AC
  Administered 2017-02-07: 1000 mg via INTRAVENOUS
  Filled 2017-02-07: qty 10

## 2017-02-07 MED ORDER — SODIUM CHLORIDE 0.9 % IV SOLN
INTRAVENOUS | Status: DC | PRN
  Administered 2017-02-07: 12:00:00 via INTRAVENOUS

## 2017-02-07 MED ORDER — SODIUM CHLORIDE 0.9 % IV SOLN
Freq: Once | INTRAVENOUS | Status: AC
  Administered 2017-02-09: 10:00:00 via INTRAVENOUS

## 2017-02-07 MED ORDER — PHENYLEPHRINE HCL 10 MG/ML IJ SOLN
INTRAMUSCULAR | Status: DC | PRN
  Administered 2017-02-07: 100 ug/min via INTRAVENOUS

## 2017-02-07 MED ORDER — PHENYLEPHRINE HCL 10 MG/ML IJ SOLN
INTRAMUSCULAR | Status: DC | PRN
  Administered 2017-02-07: 50 ug/min via INTRAVENOUS

## 2017-02-07 MED ORDER — IOPAMIDOL (ISOVUE-300) INJECTION 61%
100.0000 mL | Freq: Once | INTRAVENOUS | Status: AC | PRN
  Administered 2017-02-07: 100 mL via INTRAVENOUS

## 2017-02-07 MED ORDER — FENTANYL CITRATE (PF) 250 MCG/5ML IJ SOLN
INTRAMUSCULAR | Status: AC
  Filled 2017-02-07: qty 5

## 2017-02-07 SURGICAL SUPPLY — 73 items
6mmx250mm apex pin ×4 IMPLANT
BIT DRILL 4.9 CANNULATED (BIT) ×1
BIT DRILL 5.6 (BIT) ×1 IMPLANT
BIT DRILL CANN QC 4.9 LRG (BIT) ×1 IMPLANT
BIT DRILL CNTRSNK 6.5/8 AO CAN (DRILL) ×1 IMPLANT
BLADE CLIPPER SURG (BLADE) ×3 IMPLANT
BLADE SURG 15 STRL LF DISP TIS (BLADE) ×1 IMPLANT
BLADE SURG 15 STRL SS (BLADE) ×2
BNDG GAUZE ELAST 4 BULKY (GAUZE/BANDAGES/DRESSINGS) ×3 IMPLANT
BRUSH SCRUB SURG 4.25 DISP (MISCELLANEOUS) ×6 IMPLANT
COVER SURGICAL LIGHT HANDLE (MISCELLANEOUS) ×6 IMPLANT
DRAIN CHANNEL 15F RND FF W/TCR (WOUND CARE) IMPLANT
DRAPE C-ARM 42X72 X-RAY (DRAPES) ×3 IMPLANT
DRAPE C-ARMOR (DRAPES) ×3 IMPLANT
DRAPE INCISE IOBAN 66X45 STRL (DRAPES) ×3 IMPLANT
DRAPE LAPAROTOMY TRNSV 102X78 (DRAPE) ×3 IMPLANT
DRAPE SURG 17X23 STRL (DRAPES) ×3 IMPLANT
DRAPE U-SHAPE 47X51 STRL (DRAPES) ×3 IMPLANT
DRILL 5.6 (BIT) ×3
DRILL BIT CANNULATED 4.9 (BIT) ×2
DRILL COUNTERSINK 6.5/8 AO CAN (DRILL) ×3
DRSG ADAPTIC 3X8 NADH LF (GAUZE/BANDAGES/DRESSINGS) ×3 IMPLANT
DRSG PAD ABDOMINAL 8X10 ST (GAUZE/BANDAGES/DRESSINGS) ×6 IMPLANT
ELECT REM PT RETURN 9FT ADLT (ELECTROSURGICAL) ×3
ELECTRODE REM PT RTRN 9FT ADLT (ELECTROSURGICAL) ×1 IMPLANT
EVACUATOR SILICONE 100CC (DRAIN) IMPLANT
GAUZE SPONGE 4X4 12PLY STRL (GAUZE/BANDAGES/DRESSINGS) ×3 IMPLANT
GAUZE XEROFORM 5X9 LF (GAUZE/BANDAGES/DRESSINGS) ×3 IMPLANT
GLOVE BIO SURGEON STRL SZ7.5 (GLOVE) ×3 IMPLANT
GLOVE BIO SURGEON STRL SZ8 (GLOVE) ×3 IMPLANT
GLOVE BIOGEL PI IND STRL 7.5 (GLOVE) ×1 IMPLANT
GLOVE BIOGEL PI IND STRL 8 (GLOVE) ×1 IMPLANT
GLOVE BIOGEL PI INDICATOR 7.5 (GLOVE) ×2
GLOVE BIOGEL PI INDICATOR 8 (GLOVE) ×2
GOWN STRL REUS W/ TWL LRG LVL3 (GOWN DISPOSABLE) ×2 IMPLANT
GOWN STRL REUS W/ TWL XL LVL3 (GOWN DISPOSABLE) ×1 IMPLANT
GOWN STRL REUS W/TWL LRG LVL3 (GOWN DISPOSABLE) ×4
GOWN STRL REUS W/TWL XL LVL3 (GOWN DISPOSABLE) ×2
GUIDEWIRE ASNIS 3.2 NONCAL (WIRE) ×9 IMPLANT
KIT BASIN OR (CUSTOM PROCEDURE TRAY) ×3 IMPLANT
KIT ROOM TURNOVER OR (KITS) ×3 IMPLANT
MANIFOLD NEPTUNE II (INSTRUMENTS) ×3 IMPLANT
NS IRRIG 1000ML POUR BTL (IV SOLUTION) ×6 IMPLANT
PACK GENERAL/GYN (CUSTOM PROCEDURE TRAY) ×3 IMPLANT
PACK TOTAL JOINT (CUSTOM PROCEDURE TRAY) ×3 IMPLANT
PAD ARMBOARD 7.5X6 YLW CONV (MISCELLANEOUS) ×6 IMPLANT
PILLOW ABDUCTION HIP (SOFTGOODS) IMPLANT
PIN HALF EXT FIX 6X250MM C (EXFIX) ×6 IMPLANT
PIN TO ROD COUPLING INVERT (EXFIX) ×6 IMPLANT
ROD CARBON (EXFIX) ×2
ROD CNCT 400X11XNS LF HFMN (EXFIX) ×1 IMPLANT
SCREW BONE CANN 6.5X90MM (Screw) ×3 IMPLANT
SCREW BONE CANN 8X130MM (Screw) ×3 IMPLANT
SCREW CANN 8.0X180MM (Screw) ×3 IMPLANT
SCREW CANNULATED 8.0X145MM (Screw) ×3 IMPLANT
SPONGE LAP 18X18 X RAY DECT (DISPOSABLE) IMPLANT
STAPLER VISISTAT 35W (STAPLE) ×3 IMPLANT
SUCTION FRAZIER HANDLE 10FR (MISCELLANEOUS) ×2
SUCTION TUBE FRAZIER 10FR DISP (MISCELLANEOUS) ×1 IMPLANT
SUT ETHILON 3 0 PS 1 (SUTURE) ×3 IMPLANT
SUT VIC AB 0 CT1 27 (SUTURE) ×4
SUT VIC AB 0 CT1 27XBRD ANBCTR (SUTURE) ×2 IMPLANT
SUT VIC AB 1 CT1 18XCR BRD 8 (SUTURE) ×2 IMPLANT
SUT VIC AB 1 CT1 8-18 (SUTURE) ×4
SUT VIC AB 2-0 CT1 27 (SUTURE) ×4
SUT VIC AB 2-0 CT1 TAPERPNT 27 (SUTURE) ×2 IMPLANT
SUT VIC AB 2-0 FS1 27 (SUTURE) ×3 IMPLANT
TOWEL OR 17X24 6PK STRL BLUE (TOWEL DISPOSABLE) ×3 IMPLANT
TOWEL OR 17X26 10 PK STRL BLUE (TOWEL DISPOSABLE) ×6 IMPLANT
TRAY FOLEY W/METER SILVER 16FR (SET/KITS/TRAYS/PACK) IMPLANT
UNDERPAD 30X30 (UNDERPADS AND DIAPERS) ×3 IMPLANT
WASHER SCREW MATTA SS 13.0X1.5 (Washer) ×6 IMPLANT
WATER STERILE IRR 1000ML POUR (IV SOLUTION) ×12 IMPLANT
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
## 2017-02-07 NOTE — ED Notes (Signed)
IR and Anesthesiology made aware to be ready for patient

## 2017-02-07 NOTE — Progress Notes (Signed)
Patient ID: Peter [name], male   DOB: 02-28-1961, 56 y.o.   MRN: [id number] Back from IR. I D/W Dr. Annamaria Boots. Replace K Cryo 1 additional unit Continue TXA as antifibrinolytic Albumin ABG in 60min  Georganna Skeans, MD, MPH, FACS Trauma: [phone] General Surgery: [phone]
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
## 2017-02-07 NOTE — ED Provider Notes (Signed)
  Physical Exam  BP (!) 62/54   Pulse (!) 129   Temp (!) 96.1 F (35.6 C) (Temporal)   Resp 20   Ht 5\' 10"  (1.778 m)   Wt 75 kg (165 lb 5.5 oz)   SpO2 100%   BMI 23.72 kg/m   Physical Exam  ED Course  Procedure Name: Intubation Date/Time: 02/07/2017 8:41 AM Performed by: Monico Blitz Pre-anesthesia Checklist: Patient identified Oxygen Delivery Method: Ambu bag Preoxygenation: Pre-oxygenation with 100% oxygen Induction Type: Rapid sequence Ventilation: Mask ventilation without difficulty Laryngoscope Size: Glidescope Tube size: 7.5 mm Number of attempts: 1 Airway Equipment and Method: Stylet Placement Confirmation: ETT inserted through vocal cords under direct vision,  Positive ETCO2 and CO2 detector Secured at: 25 cm Tube secured with: ETT holder              Monico Blitz, PA-C 02/07/17 [place]    Isla Pence, MD 02/07/17 1107

## 2017-02-07 NOTE — Progress Notes (Signed)
RT note-Patient transported to CT and back to ED remains on current settings.

## 2017-02-07 NOTE — ED Notes (Signed)
Ortho arrived at the bedside in CT
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
## 2017-02-07 NOTE — Progress Notes (Signed)
Pt returned from IR. Pt is intubated and sedated. VSS.
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
## 2017-02-07 NOTE — ED Provider Notes (Signed)
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 lobe and basal right lower lobe, likely representing pulmonary contusions. Small right pneumothorax. Musculoskeletal: There is subcutaneous emphysema in the left chest wall and paraspinous muscles, extending into the left supraclavicular space. There are  multiple bilateral rib fractures. On the left, there are mildly displaced fractures of the posterior 1st and 8th-11th ribs near the costovertebral junction. There is a mildly displaced fracture of the anterolateral 2nd rib. There are displaced fractures of the lateral 3rd-5th ribs. There are nondisplaced fractures of the lateral 6th-9th ribs. Nondisplaced fracture of the posterior 11th rib. Mildly displaced fracture of the posterior 12th rib. On the right, there is a mildly displaced fracture of the posterior 1st rib. There are nondisplaced fractures of the posterior 3rd and 7th ribs, and lateral 3rd-8th ribs. Minimal anterior superior endplate deformity of the T4 vertebral body.  Spinal alignment is maintained. There is a fracture of the right T7 transverse process. CT ABDOMEN PELVIS FINDINGS Hepatobiliary: No hepatic injury or perihepatic hematoma. Cholelithiasis. Pancreas: Unremarkable. No pancreatic ductal dilatation or surrounding inflammatory changes. Spleen: Splenic laceration with small focus of of hyperdensity, suggestive of active extravasation. No perisplenic hematoma. Adrenals/Urinary Tract: Heterogeneous appearance of the right adrenal gland likely reflects adrenal hemorrhage. There is a 2.3 cm incompletely characterized lesion in the left adrenal gland. There is subtle irregularity of the posterior wall of the right kidney, likely reflecting a laceration. There is a small amount of right perinephric hematoma. No evidence of left renal injury. No hydronephrosis. The bladder is displaced to the right due to large left extraperitoneal pelvic hematoma. Stomach/Bowel: Enteric tube with tip in the gastric antrum. Stomach is mildly distended. The appendix is normal. No bowel wall thickening, distention, or surrounding inflammatory change. No pneumoperitoneum. Vascular/Lymphatic: No evidence of abdominal aorta or branch vessel injury. The IVC is decompressed. There is a large left pelvic side wall hematoma with multiple areas of hyperdensity, consistent with active extravasation. The hematoma extends across the spaces of Retzius into the right pelvic sidewall. There is a small amount of retroperitoneal hematoma along the left psoas and iliacus muscles, as well as at the aortic bifurcation. No abdominal or pelvic lymphadenopathy. Reproductive: Prostate is unremarkable. Other: No abdominal wall hernia or abnormality. No abdominopelvic ascites. Musculoskeletal: There is a nondisplaced fracture of the left L1 transverse process. There are mildly distracted fractures of the left L2 through L5 transverse processes. There is diastasis of the bilateral sacroiliac joints with a comminuted  intra-articular fractures of the right posterior iliac bone and the superior aspect of the right sacral ala. There are minimally displaced fractures of the bilateral puboacetabular junctions. Comminuted, displaced fracture of the left inferior pubic ramus. Probable nondisplaced fracture of the right inferior pubic ramus. Diastasis of the pubic symphysis. No hip dislocation or proximal femur fracture. No vertebral body fracture. Spinal alignment is maintained. IMPRESSION: Traumatic findings: 1. Multiple bilateral rib fractures as described above, including completely displaced fractures of the left lateral third through fifth ribs. There are associated moderate left and small right pneumothoraces with extensive left greater than right pulmonary contusions. 2. Trace pneumomediastinum in the posterior mediastinum without definite tracheal or esophageal injury. 3. Age-indeterminate anterior superior endplate deformity of the T4 vertebral body. Correlate with point tenderness. Nondisplaced fracture of the right T7 transverse process. 4. Multiple pelvic fractures, as described above, including diastasis of the bilateral sacroiliac joints and pubic symphysis. There is an associated large left pelvic side wall hematoma with evidence of active extravasation. The hematoma extends across the space of Retzius into the right pelvic side wall and superiorly along the left iliacus and psoas muscles. 5. Splenic laceration with small area of active extravasation. 6. Small right renal laceration with small right perinephric hematoma. 7. Small  right adrenal gland hemorrhage. 8. Fractures of the left L1 through L5 transverse processes. Non-traumatic findings: 1. Incompletely characterized 2.2 cm nodule in the left adrenal gland. 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 Consider further evaluation with nonemergent adrenal protocol CT. These results were called by telephone at the time of interpretation on  02/07/2017 at 10:15 am to Dr. Georganna Skeans, who verbally acknowledged these results. Electronically Signed   By: Titus Dubin M.D.   On: 02/07/2017 10:17   Ir Angiogram Visceral Selective  Result Date: 02/08/2017 INDICATION: Trauma, splenic laceration, pelvic fractures with acute left internal iliac active bleeding by CT secondary to pelvic fractures. EXAM: Ultrasound guidance for vascular access Left internal iliac catheterization, angiogram and Gel-Foam embolization of the anterior division Selective catheterization and angiogram of the SMA Selective catheterization and angiogram of the celiac artery Advancement of a microcatheter into the splenic artery distal to the pancreatic branches for proximal splenic artery coil embolization MEDICATIONS: Ancef 2 g. The antibiotic was administered within 1 hour of the procedure ANESTHESIA/SEDATION: General anesthesia The patient's level of consciousness and vital signs were monitored continuously by radiology nursing and anesthesia throughout the procedure under my direct supervision. CONTRAST:  120 cc Isovue 370 FLUOROSCOPY TIME:  Fluoroscopy Time: 15 minutes 54 seconds (1,126 mGy). COMPLICATIONS: None immediate. PROCEDURE: Informed consent was obtained from the patient's family following explanation of the procedure, risks, benefits and alternatives. The patient understands, agrees and consents for the procedure. All questions were addressed. A time out was performed prior to the initiation of the procedure. Maximal barrier sterile technique utilized including caps, mask, sterile gowns, sterile gloves, large sterile drape, hand hygiene, and Betadine prep. Under sterile conditions and general anesthesia, right common femoral artery micropuncture access performed with ultrasound. Images obtained for documentation. Initially, a 5 [place] sheath inserted. C2 catheter was advanced into the contralateral left internal iliac artery anterior division. Selective left  internal iliac angiogram performed. Left internal iliac artery: There is diffuse irregularity of the left internal iliac anterior division and its branches in the left pelvic side wall compatible with traumatic vascular injury and scattered areas of dissection. This correlates with the CT finding. Left internal iliac artery anterior division Gel-Foam embolization: Through the 5 French catheter, Gel-Foam slurry embolization performed of the left internal iliac anterior division to near complete stasis. Post embolization angiogram confirms stasis within the left internal iliac anterior division. Posterior division remains patent. Catheter was retracted and flushed. Catheter was advanced into the aorta. Initially SMA was selected with the catheter. SMA angiogram performed. SMA: Main SMA trunk is patent. Jejunal branches are visualized and patent. The catheter was exchanged for a Mickelson catheter which was reformed over the bifurcation. Mickelson catheter was utilized to select celiac origin. Selective celiac angiogram performed. Celiac artery: Celiac origin is patent. The hepatic, gastroduodenal, left gastric, and splenic arteries are all patent. Over a Glidewire, Mickelson catheter was advanced to select the splenic artery. Selective splenic angiogram performed. Splenic artery: Splenic artery is patent. Pancreatic branches are demonstrated. Parenchymal defects within the spleen noted related to the laceration. No active bleeding angiographically. On the venous phase splenic vein is patent. Renegade STC microcatheter advanced more peripherally within the splenic artery distal to the pancreatic branches. Peripheral splenic arteriogram performed. Although no active bleeding was demonstrated within the spleen there are parenchymal defects compatible with the known intra splenic laceration. Proximal splenic artery coil embolization: Through the microcatheter, 018 interlock coils were utilized for coil embolization  ranging  in size from 5 - 8 mm. Total of 10 micro coils were deployed into the splenic artery for proximal splenic artery embolization. Following embolization, repeat angiogram confirms stasis within the splenic artery. Microcatheter and 5 French catheter removed. Hemostasis obtained at the right common femoral artery with the Proclose device. No immediate complication.  Patient tolerated the procedure well. IMPRESSION: Successful left internal iliac anterior division Gel-Foam embolization Successful proximal splenic artery micro coil embolization Electronically Signed   By: Jerilynn Mages.  Shick M.D.   On: 02/08/2017 08:14   Ir Angiogram Visceral Selective  Result Date: 02/08/2017 INDICATION: Trauma, splenic laceration, pelvic fractures with acute left internal iliac active bleeding by CT secondary to pelvic fractures. EXAM: Ultrasound guidance for vascular access Left internal iliac catheterization, angiogram and Gel-Foam embolization of the anterior division Selective catheterization and angiogram of the SMA Selective catheterization and angiogram of the celiac artery Advancement of a microcatheter into the splenic artery distal to the pancreatic branches for proximal splenic artery coil embolization MEDICATIONS: Ancef 2 g. The antibiotic was administered within 1 hour of the procedure ANESTHESIA/SEDATION: General anesthesia The patient's level of consciousness and vital signs were monitored continuously by radiology nursing and anesthesia throughout the procedure under my direct supervision. CONTRAST:  120 cc Isovue 370 FLUOROSCOPY TIME:  Fluoroscopy Time: 15 minutes 54 seconds (1,126 mGy). COMPLICATIONS: None immediate. PROCEDURE: Informed consent was obtained from the patient's family following explanation of the procedure, risks, benefits and alternatives. The patient understands, agrees and consents for the procedure. All questions were addressed. A time out was performed prior to the initiation of the procedure.  Maximal barrier sterile technique utilized including caps, mask, sterile gowns, sterile gloves, large sterile drape, hand hygiene, and Betadine prep. Under sterile conditions and general anesthesia, right common femoral artery micropuncture access performed with ultrasound. Images obtained for documentation. Initially, a 5 [place] sheath inserted. C2 catheter was advanced into the contralateral left internal iliac artery anterior division. Selective left internal iliac angiogram performed. Left internal iliac artery: There is diffuse irregularity of the left internal iliac anterior division and its branches in the left pelvic side wall compatible with traumatic vascular injury and scattered areas of dissection. This correlates with the CT finding. Left internal iliac artery anterior division Gel-Foam embolization: Through the 5 French catheter, Gel-Foam slurry embolization performed of the left internal iliac anterior division to near complete stasis. Post embolization angiogram confirms stasis within the left internal iliac anterior division. Posterior division remains patent. Catheter was retracted and flushed. Catheter was advanced into the aorta. Initially SMA was selected with the catheter. SMA angiogram performed. SMA: Main SMA trunk is patent. Jejunal branches are visualized and patent. The catheter was exchanged for a Mickelson catheter which was reformed over the bifurcation. Mickelson catheter was utilized to select celiac origin. Selective celiac angiogram performed. Celiac artery: Celiac origin is patent. The hepatic, gastroduodenal, left gastric, and splenic arteries are all patent. Over a Glidewire, Mickelson catheter was advanced to select the splenic artery. Selective splenic angiogram performed. Splenic artery: Splenic artery is patent. Pancreatic branches are demonstrated. Parenchymal defects within the spleen noted related to the laceration. No active bleeding angiographically. On the venous phase  splenic vein is patent. Renegade STC microcatheter advanced more peripherally within the splenic artery distal to the pancreatic branches. Peripheral splenic arteriogram performed. Although no active bleeding was demonstrated within the spleen there are parenchymal defects compatible with the known intra splenic laceration. Proximal splenic artery coil embolization: Through the microcatheter, 018 interlock coils were utilized  for coil embolization ranging in size from 5 - 8 mm. Total of 10 micro coils were deployed into the splenic artery for proximal splenic artery embolization. Following embolization, repeat angiogram confirms stasis within the splenic artery. Microcatheter and 5 French catheter removed. Hemostasis obtained at the right common femoral artery with the Proclose device. No immediate complication.  Patient tolerated the procedure well. IMPRESSION: Successful left internal iliac anterior division Gel-Foam embolization Successful proximal splenic artery micro coil embolization Electronically Signed   By: Jerilynn Mages.  Shick M.D.   On: 02/08/2017 08:14   Ir Angiogram Pelvis Selective Or Supraselective  Result Date: 02/08/2017 INDICATION: Trauma, splenic laceration, pelvic fractures with acute left internal iliac active bleeding by CT secondary to pelvic fractures. EXAM: Ultrasound guidance for vascular access Left internal iliac catheterization, angiogram and Gel-Foam embolization of the anterior division Selective catheterization and angiogram of the SMA Selective catheterization and angiogram of the celiac artery Advancement of a microcatheter into the splenic artery distal to the pancreatic branches for proximal splenic artery coil embolization MEDICATIONS: Ancef 2 g. The antibiotic was administered within 1 hour of the procedure ANESTHESIA/SEDATION: General anesthesia The patient's level of consciousness and vital signs were monitored continuously by radiology nursing and anesthesia throughout the  procedure under my direct supervision. CONTRAST:  120 cc Isovue 370 FLUOROSCOPY TIME:  Fluoroscopy Time: 15 minutes 54 seconds (1,126 mGy). COMPLICATIONS: None immediate. PROCEDURE: Informed consent was obtained from the patient's family following explanation of the procedure, risks, benefits and alternatives. The patient understands, agrees and consents for the procedure. All questions were addressed. A time out was performed prior to the initiation of the procedure. Maximal barrier sterile technique utilized including caps, mask, sterile gowns, sterile gloves, large sterile drape, hand hygiene, and Betadine prep. Under sterile conditions and general anesthesia, right common femoral artery micropuncture access performed with ultrasound. Images obtained for documentation. Initially, a 5 [place] sheath inserted. C2 catheter was advanced into the contralateral left internal iliac artery anterior division. Selective left internal iliac angiogram performed. Left internal iliac artery: There is diffuse irregularity of the left internal iliac anterior division and its branches in the left pelvic side wall compatible with traumatic vascular injury and scattered areas of dissection. This correlates with the CT finding. Left internal iliac artery anterior division Gel-Foam embolization: Through the 5 French catheter, Gel-Foam slurry embolization performed of the left internal iliac anterior division to near complete stasis. Post embolization angiogram confirms stasis within the left internal iliac anterior division. Posterior division remains patent. Catheter was retracted and flushed. Catheter was advanced into the aorta. Initially SMA was selected with the catheter. SMA angiogram performed. SMA: Main SMA trunk is patent. Jejunal branches are visualized and patent. The catheter was exchanged for a Mickelson catheter which was reformed over the bifurcation. Mickelson catheter was utilized to select celiac origin. Selective  celiac angiogram performed. Celiac artery: Celiac origin is patent. The hepatic, gastroduodenal, left gastric, and splenic arteries are all patent. Over a Glidewire, Mickelson catheter was advanced to select the splenic artery. Selective splenic angiogram performed. Splenic artery: Splenic artery is patent. Pancreatic branches are demonstrated. Parenchymal defects within the spleen noted related to the laceration. No active bleeding angiographically. On the venous phase splenic vein is patent. Renegade STC microcatheter advanced more peripherally within the splenic artery distal to the pancreatic branches. Peripheral splenic arteriogram performed. Although no active bleeding was demonstrated within the spleen there are parenchymal defects compatible with the known intra splenic laceration. Proximal splenic artery coil embolization: Through the  microcatheter, 018 interlock coils were utilized for coil embolization ranging in size from 5 - 8 mm. Total of 10 micro coils were deployed into the splenic artery for proximal splenic artery embolization. Following embolization, repeat angiogram confirms stasis within the splenic artery. Microcatheter and 5 French catheter removed. Hemostasis obtained at the right common femoral artery with the Proclose device. No immediate complication.  Patient tolerated the procedure well. IMPRESSION: Successful left internal iliac anterior division Gel-Foam embolization Successful proximal splenic artery micro coil embolization Electronically Signed   By: Jerilynn Mages.  Shick M.D.   On: 02/08/2017 08:14   Ir Angiogram Selective Each Additional Vessel  Result Date: 02/08/2017 INDICATION: Trauma, splenic laceration, pelvic fractures with acute left internal iliac active bleeding by CT secondary to pelvic fractures. EXAM: Ultrasound guidance for vascular access Left internal iliac catheterization, angiogram and Gel-Foam embolization of the anterior division Selective catheterization and angiogram  of the SMA Selective catheterization and angiogram of the celiac artery Advancement of a microcatheter into the splenic artery distal to the pancreatic branches for proximal splenic artery coil embolization MEDICATIONS: Ancef 2 g. The antibiotic was administered within 1 hour of the procedure ANESTHESIA/SEDATION: General anesthesia The patient's level of consciousness and vital signs were monitored continuously by radiology nursing and anesthesia throughout the procedure under my direct supervision. CONTRAST:  120 cc Isovue 370 FLUOROSCOPY TIME:  Fluoroscopy Time: 15 minutes 54 seconds (1,126 mGy). COMPLICATIONS: None immediate. PROCEDURE: Informed consent was obtained from the patient's family following explanation of the procedure, risks, benefits and alternatives. The patient understands, agrees and consents for the procedure. All questions were addressed. A time out was performed prior to the initiation of the procedure. Maximal barrier sterile technique utilized including caps, mask, sterile gowns, sterile gloves, large sterile drape, hand hygiene, and Betadine prep. Under sterile conditions and general anesthesia, right common femoral artery micropuncture access performed with ultrasound. Images obtained for documentation. Initially, a 5 [place] sheath inserted. C2 catheter was advanced into the contralateral left internal iliac artery anterior division. Selective left internal iliac angiogram performed. Left internal iliac artery: There is diffuse irregularity of the left internal iliac anterior division and its branches in the left pelvic side wall compatible with traumatic vascular injury and scattered areas of dissection. This correlates with the CT finding. Left internal iliac artery anterior division Gel-Foam embolization: Through the 5 French catheter, Gel-Foam slurry embolization performed of the left internal iliac anterior division to near complete stasis. Post embolization angiogram confirms stasis  within the left internal iliac anterior division. Posterior division remains patent. Catheter was retracted and flushed. Catheter was advanced into the aorta. Initially SMA was selected with the catheter. SMA angiogram performed. SMA: Main SMA trunk is patent. Jejunal branches are visualized and patent. The catheter was exchanged for a Mickelson catheter which was reformed over the bifurcation. Mickelson catheter was utilized to select celiac origin. Selective celiac angiogram performed. Celiac artery: Celiac origin is patent. The hepatic, gastroduodenal, left gastric, and splenic arteries are all patent. Over a Glidewire, Mickelson catheter was advanced to select the splenic artery. Selective splenic angiogram performed. Splenic artery: Splenic artery is patent. Pancreatic branches are demonstrated. Parenchymal defects within the spleen noted related to the laceration. No active bleeding angiographically. On the venous phase splenic vein is patent. Renegade STC microcatheter advanced more peripherally within the splenic artery distal to the pancreatic branches. Peripheral splenic arteriogram performed. Although no active bleeding was demonstrated within the spleen there are parenchymal defects compatible with the known intra splenic laceration. Proximal  splenic artery coil embolization: Through the microcatheter, 018 interlock coils were utilized for coil embolization ranging in size from 5 - 8 mm. Total of 10 micro coils were deployed into the splenic artery for proximal splenic artery embolization. Following embolization, repeat angiogram confirms stasis within the splenic artery. Microcatheter and 5 French catheter removed. Hemostasis obtained at the right common femoral artery with the Proclose device. No immediate complication.  Patient tolerated the procedure well. IMPRESSION: Successful left internal iliac anterior division Gel-Foam embolization Successful proximal splenic artery micro coil embolization  Electronically Signed   By: Jerilynn Mages.  Shick M.D.   On: 02/08/2017 08:14   Ir Angiogram Selective Each Additional Vessel  Result Date: 02/08/2017 INDICATION: Trauma, splenic laceration, pelvic fractures with acute left internal iliac active bleeding by CT secondary to pelvic fractures. EXAM: Ultrasound guidance for vascular access Left internal iliac catheterization, angiogram and Gel-Foam embolization of the anterior division Selective catheterization and angiogram of the SMA Selective catheterization and angiogram of the celiac artery Advancement of a microcatheter into the splenic artery distal to the pancreatic branches for proximal splenic artery coil embolization MEDICATIONS: Ancef 2 g. The antibiotic was administered within 1 hour of the procedure ANESTHESIA/SEDATION: General anesthesia The patient's level of consciousness and vital signs were monitored continuously by radiology nursing and anesthesia throughout the procedure under my direct supervision. CONTRAST:  120 cc Isovue 370 FLUOROSCOPY TIME:  Fluoroscopy Time: 15 minutes 54 seconds (1,126 mGy). COMPLICATIONS: None immediate. PROCEDURE: Informed consent was obtained from the patient's family following explanation of the procedure, risks, benefits and alternatives. The patient understands, agrees and consents for the procedure. All questions were addressed. A time out was performed prior to the initiation of the procedure. Maximal barrier sterile technique utilized including caps, mask, sterile gowns, sterile gloves, large sterile drape, hand hygiene, and Betadine prep. Under sterile conditions and general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

## 2017-02-07 NOTE — ED Notes (Signed)
MD at the bedside placing chest tube.

## 2017-02-07 NOTE — Progress Notes (Signed)
Procedure has begun and pt continues to be sedated under the care of the anesthesia team.

## 2017-02-07 NOTE — Anesthesia Procedure Notes (Signed)
Arterial Line Insertion Start/End8/21/2018 11:30 AM, 02/07/2017 11:35 AM Performed by: Lynda Rainwater, anesthesiologist  Patient location: Pre-op. Preanesthetic checklist: patient identified, IV checked, site marked, risks and benefits discussed, surgical consent, monitors and equipment checked, pre-op evaluation, timeout performed and anesthesia consent radial was placed Catheter size: 20 Fr Hand hygiene performed  and maximum sterile barriers used   Attempts: 1 Procedure performed without using ultrasound guided technique. Following insertion, dressing applied. Post procedure assessment: normal and unchanged

## 2017-02-07 NOTE — Transfer of Care (Signed)
Immediate Anesthesia Transfer of Care Note  Patient: Javonni Macke  Procedure(s) Performed: Procedure(s): RADIOLOGY WITH ANESTHESIA (N/A)  Patient Location: SICU  Anesthesia Type:General  Level of Consciousness: sedated and unresponsive  Airway & Oxygen Therapy: Patient remains intubated per anesthesia plan and Patient placed on Ventilator (see vital sign flow sheet for setting)  Post-op Assessment: Report given to RN  Post vital signs: Reviewed and stable  Last Vitals:  Vitals:   02/07/17 1725 02/07/17 1730  BP: (!) 168/103 (!) 168/103  Pulse: (!) 102   Resp: 20   Temp:    SpO2: 96% 97%    Last Pain:  Vitals:   02/07/17 1411  TempSrc: Oral  PainSc:          Complications: No apparent anesthesia complications.  Report to RN as well as Dr. Grandville Silos who is at bedside.  He is aware of last I-stat results and low potassium (1 run given).  RNs to recheck CBG now.  +BBS, diminished.  Chest tube with minimal bloody output.
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
## 2017-02-07 NOTE — Procedures (Signed)
FAST  Pre-procedure diagnosis:PHBC Post-procedure diagnosis:No significant hemoperitoneum, no pericardial effusion Procedure: FAST Surgeon: Georganna Skeans, MD Procedure in detail: The patient's abdomen was imaged in 4 regions with the ultrasound. First, the right upper quadrant was imaged. No free fluid was seen between the right kidney and the liver in Morison's pouch. Next, the epigastrium was imaged. No significant pericardial effusion was seen. Next, the left upper quadrant was imaged. No free fluid was seen between the left kidney and the spleen. Finally, the bladder was imaged. No free fluid was seen next to the bladder in the pelvis. Impression: Negative  Georganna Skeans, MD, MPH, FACS Trauma: [phone] General Surgery: [phone]

## 2017-02-07 NOTE — ED Notes (Signed)
Pt being transported upstairs at this point

## 2017-02-07 NOTE — Procedures (Signed)
Chest Tube Insertion Procedure Note  Indications:  Left hemopneumothorax  Pre-operative Diagnosis: L hemopneumothorax  Post-operative Diagnosis: L hemopneumothorqax  Surgeon: Georganna Skeans, MD  Assist: Margie Billet, [facility]  Procedure Details  emergency consent was obtained   After sterile skin prep, using standard technique, a 14 French tube was placed in the L anterior axillary line above hte nipple level using Seldinger technique. It was attached to a Pleur-evac. Sterile dressing was applied.  Findings: 100cc blood         Specimens:  none              Complications:  None; patient tolerated the procedure well.         Disposition: trauma bay         Condition: unstable  Georganna Skeans, MD, MPH, FACS Trauma: [phone] General Surgery: [phone]

## 2017-02-07 NOTE — Progress Notes (Signed)
   02/07/17 1000  Clinical Encounter Type  Visited With Family  Visit Type Initial;Spiritual support;Psychological support;Trauma;ED  Referral From Care management  Spiritual Encounters  Spiritual Needs Emotional  Stress Factors  Patient Stress Factors None identified  Family Stress Factors Loss of control;Lack of knowledge    Chaplain offered and provided emotional and spiritual support to the loved one of the Pt. Chaplain stayed with family while they were updated and provided prayer. Please page if family needs more emotional or spiritual support.

## 2017-02-07 NOTE — Progress Notes (Signed)
Report given at bedside to Burley Saver, RN and CRNA. Pt transported to IR on monitor. CRNA disconnected pt from ventilator and used BVM for transport. VSS.

## 2017-02-07 NOTE — ED Notes (Signed)
Xray at the bedside for portable

## 2017-02-07 NOTE — Addendum Note (Signed)
Addendum  created 02/07/17 1500 by Freddie Breech, CRNA   Anesthesia Event edited

## 2017-02-07 NOTE — Anesthesia Postprocedure Evaluation (Signed)
Anesthesia Post Note  Patient: Peter [name]  Procedure(s) Performed: Procedure(s) (LRB): RADIOLOGY WITH ANESTHESIA (N/A)     Patient location during evaluation: SICU Anesthesia Type: General Level of consciousness: sedated Pain management: pain level controlled Vital Signs Assessment: post-procedure vital signs reviewed and stable Respiratory status: patient remains intubated per anesthesia plan Cardiovascular status: stable Anesthetic complications: no    Last Vitals:  Vitals:   02/07/17 1730 02/07/17 1800  BP: (!) 168/103   Pulse:  (!) 105  Resp:  20  Temp:  (!) 35.5 C  SpO2: 97% 100%    Last Pain:  Vitals:   02/07/17 1411  TempSrc: Oral  PainSc:                  Mylik Pro,W. EDMOND
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
## 2017-02-07 NOTE — Transfer of Care (Signed)
Immediate Anesthesia Transfer of Care Note  Patient: Peter [name]  Procedure(s) Performed: Procedure(s): EXTERNAL FIXATION PELVIS (N/A) SACROILIAC JOINT FUSION (N/A)  Patient Location: PACU and ICU  Anesthesia Type:General  Level of Consciousness: Patient remains intubated per anesthesia plan  Airway & Oxygen Therapy: Patient remains intubated per anesthesia plan and Patient placed on Ventilator (see vital sign flow sheet for setting)  Post-op Assessment: Report given to RN and Post -op Vital signs reviewed and stable  Post vital signs: Reviewed and stable  Last Vitals:  Vitals:   02/07/17 1045 02/07/17 1338  BP: 130/89   Pulse: (!) 143   Resp: 20   Temp: (!) 35.8 C 36.4 C  SpO2: 100%     Last Pain:  Vitals:   02/07/17 1036  TempSrc:   PainSc: Asleep         Complications: No apparent anesthesia complications

## 2017-02-07 NOTE — Progress Notes (Signed)
Pt in the IR room and under sedation. Pt will be given sedation under the care of the anesthesia team. CRNAs present at bedside.  Staff setting up for procedure.

## 2017-02-07 NOTE — Procedures (Signed)
Central Venous Catheter Insertion Procedure Note Audrick Lamoureaux [id number] [birth year]  Procedure: Insertion of Central Venous Catheter Indications: Drug and/or fluid administration   Surgeon: Georganna Skeans, MD Assist: Margie Billet, [facility]  Procedure Details Consent: Unable to obtain consent because of emergent medical necessity. Time Out: Verified patient identification, verified procedure, site/side was marked, verified correct patient position, special equipment/implants available, medications/allergies/relevent history reviewed, required imaging and test results available.  Performed  Maximum sterile technique was used including antiseptics, cap, gloves, gown, hand hygiene, mask and sheet. Skin prep: Chlorhexidine; local anesthetic administered A antimicrobial bonded/coated triple lumen catheter was placed in the left subclavian vein using the Seldinger technique.  Evaluation Blood flow good Complications: No apparent complications Patient did tolerate procedure well. Chest X-ray ordered to verify placement.  CXR: good placement, small PTX present prior to line insertion.  Rudolfo Brandow E 02/07/2017, 10:25 AM

## 2017-02-07 NOTE — Brief Op Note (Signed)
02/07/2017  2:31 PM  PATIENT:  Peter [name]  56 y.o. male  PRE-OPERATIVE DIAGNOSIS:  APC3 PELVIC RING DISRUPTION  POST-OPERATIVE DIAGNOSIS:  APC3 PELVIC RING DISRUPTION  PROCEDURE:  Procedure(s): 1. SACROILIAC SCREW FIXATION, LEFT AND RIGHT, S1 AND S2 2. EXTERNAL FIXATION PELVIS (N/A) ANTERIOR PELVIC RING 3. CLOSED REDUCTION OF ANTERIOR RING  SURGEON:  Surgeon(s) and Role:    Altamese Independence, MD - Primary  PHYSICIAN ASSISTANT: Ainsley Spinner, PA-C  ANESTHESIA:   general  EBL:  Total I/O In: 1815 [I.V.:1000; Blood:565; IV Piggyback:250] Out: 3818 [Urine:1100; Blood:75; Chest Tube:58]  BLOOD ADMINISTERED:none  DRAINS: none   LOCAL MEDICATIONS USED:  NONE  SPECIMEN:  No Specimen  DISPOSITION OF SPECIMEN:  N/A  COUNTS:  YES  TOURNIQUET:  * No tourniquets in log *  DICTATION: written  PLAN OF CARE: Admit to inpatient   PATIENT DISPOSITION:  ICU - intubated and hemodynamically stable.   Delay start of Pharmacological VTE agent (>24hrs) due to surgical blood loss or risk of bleeding: per Trauma Service

## 2017-02-07 NOTE — ED Notes (Signed)
Fentanyl and Propofol stopped per OR staff. Wasted in Sink  Fentanyl with RN Rod Holler as witnessed.

## 2017-02-07 NOTE — Transfer of Care (Deleted)
Immediate Anesthesia Transfer of Care Note  Patient: Kaj Vasil  Procedure(s) Performed: Procedure(s): OPEN REDUCTION INTERNAL FIXATION (ORIF) PELVIC FRACTURE (N/A) EXTERNAL FIXATION PELVIS (N/A) SACROILIAC JOINT FUSION (N/A)  Patient Location: PACU  Anesthesia Type:General  Level of Consciousness: drowsy and patient cooperative  Airway & Oxygen Therapy: Patient Spontanous Breathing and Patient connected to face mask oxygen  Post-op Assessment: Report given to RN and Post -op Vital signs reviewed and stable  Post vital signs: Reviewed and stable  Last Vitals:  Vitals:   02/07/17 1015 02/07/17 1030  BP: (!) 148/93 128/89  Pulse: (!) 151 (!) 150  Resp: 20 20  Temp: (!) 35.7 C (!) 35.8 C  SpO2: 100% 100%    Last Pain:  Vitals:   02/07/17 1036  TempSrc:   PainSc: Asleep         Complications: No apparent anesthesia complications

## 2017-02-07 NOTE — Anesthesia Postprocedure Evaluation (Signed)
Anesthesia Post Note  Patient: Peter [name]  Procedure(s) Performed: Procedure(s) (LRB): EXTERNAL FIXATION PELVIS (N/A) SACROILIAC JOINT FUSION (N/A)     Patient location during evaluation: SICU Anesthesia Type: General Level of consciousness: sedated Pain management: pain level controlled Vital Signs Assessment: post-procedure vital signs reviewed and stable Respiratory status: patient remains intubated per anesthesia plan Cardiovascular status: stable Anesthetic complications: no    Last Vitals:  Vitals:   02/07/17 1356 02/07/17 1411  BP: 123/77 114/64  Pulse: (!) 133 (!) 117  Resp: 20 20  Temp:  36.5 C  SpO2:  100%    Last Pain:  Vitals:   02/07/17 1411  TempSrc: Oral  PainSc:                  Marteze Vecchio,W. EDMOND

## 2017-02-07 NOTE — ED Notes (Signed)
MD Grandville Silos placing [employer] at this time
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
## 2017-02-08 ENCOUNTER — Inpatient Hospital Stay (HOSPITAL_COMMUNITY): Payer: BLUE CROSS/BLUE SHIELD

## 2017-02-08 ENCOUNTER — Encounter (HOSPITAL_COMMUNITY): Payer: Self-pay | Admitting: Interventional Radiology

## 2017-02-08 DIAGNOSIS — S3282XA Multiple fractures of pelvis without disruption of pelvic ring, initial encounter for closed fracture: Secondary | ICD-10-CM

## 2017-02-08 HISTORY — DX: Multiple fractures of pelvis without disruption of pelvic ring, initial encounter for closed fracture: S32.82XA

## 2017-02-08 LAB — BPAM CRYOPRECIPITATE
BLOOD PRODUCT EXPIRATION DATE: 201808212315
Blood Product Expiration Date: 201808212208
ISSUE DATE / TIME: 201808211614
ISSUE DATE / TIME: 201808211758
Unit Type and Rh: 6200
Unit Type and Rh: 5100

## 2017-02-08 LAB — GLUCOSE, CAPILLARY
GLUCOSE-CAPILLARY: 105 mg/dL — AB (ref 65–99)
GLUCOSE-CAPILLARY: 119 mg/dL — AB (ref 65–99)
GLUCOSE-CAPILLARY: 124 mg/dL — AB (ref 65–99)
GLUCOSE-CAPILLARY: 130 mg/dL — AB (ref 65–99)
GLUCOSE-CAPILLARY: 140 mg/dL — AB (ref 65–99)
GLUCOSE-CAPILLARY: 140 mg/dL — AB (ref 65–99)
GLUCOSE-CAPILLARY: 159 mg/dL — AB (ref 65–99)
GLUCOSE-CAPILLARY: 66 mg/dL (ref 65–99)
GLUCOSE-CAPILLARY: 99 mg/dL (ref 65–99)
Glucose-Capillary: 133 mg/dL — ABNORMAL HIGH (ref 65–99)
Glucose-Capillary: 138 mg/dL — ABNORMAL HIGH (ref 65–99)
Glucose-Capillary: 138 mg/dL — ABNORMAL HIGH (ref 65–99)
Glucose-Capillary: 145 mg/dL — ABNORMAL HIGH (ref 65–99)
Glucose-Capillary: 146 mg/dL — ABNORMAL HIGH (ref 65–99)
Glucose-Capillary: 327 mg/dL — ABNORMAL HIGH (ref 65–99)
Glucose-Capillary: 335 mg/dL — ABNORMAL HIGH (ref 65–99)

## 2017-02-08 LAB — POCT I-STAT 7, (LYTES, BLD GAS, ICA,H+H)
ACID-BASE DEFICIT: 7 mmol/L — AB (ref 0.0–2.0)
Acid-base deficit: 10 mmol/L — ABNORMAL HIGH (ref 0.0–2.0)
BICARBONATE: 20.1 mmol/L (ref 20.0–28.0)
Bicarbonate: 17.3 mmol/L — ABNORMAL LOW (ref 20.0–28.0)
Calcium, Ion: 1 mmol/L — ABNORMAL LOW (ref 1.15–1.40)
Calcium, Ion: 0.96 mmol/L — ABNORMAL LOW (ref 1.15–1.40)
HCT: 27 % — ABNORMAL LOW (ref 39.0–52.0)
HEMATOCRIT: 29 % — AB (ref 39.0–52.0)
HEMOGLOBIN: 9.2 g/dL — AB (ref 13.0–17.0)
HEMOGLOBIN: 9.9 g/dL — AB (ref 13.0–17.0)
O2 SAT: 97 %
O2 SAT: 94 %
PCO2 ART: 44.7 mmHg (ref 32.0–48.0)
PCO2 ART: 43 mmHg (ref 32.0–48.0)
PH ART: 7.262 — AB (ref 7.350–7.450)
PH ART: 7.214 — AB (ref 7.350–7.450)
PO2 ART: 110 mmHg — AB (ref 83.0–108.0)
POTASSIUM: 2.5 mmol/L — AB (ref 3.5–5.1)
Potassium: 2.9 mmol/L — ABNORMAL LOW (ref 3.5–5.1)
SODIUM: 143 mmol/L (ref 135–145)
Sodium: 144 mmol/L (ref 135–145)
TCO2: 21 mmol/L (ref 0–100)
TCO2: 19 mmol/L (ref 0–100)
pO2, Arterial: 86 mmHg (ref 83.0–108.0)

## 2017-02-08 LAB — PREPARE PLATELET PHERESIS
Unit division: 0
Unit division: 0

## 2017-02-08 LAB — COMPREHENSIVE METABOLIC PANEL
ALT: 79 U/L — AB (ref 17–63)
ANION GAP: 6 (ref 5–15)
AST: 131 U/L — AB (ref 15–41)
Albumin: 3.2 g/dL — ABNORMAL LOW (ref 3.5–5.0)
Alkaline Phosphatase: 38 U/L (ref 38–126)
BUN: 15 mg/dL (ref 6–20)
CO2: 25 mmol/L (ref 22–32)
Calcium: 7 mg/dL — ABNORMAL LOW (ref 8.9–10.3)
Chloride: 114 mmol/L — ABNORMAL HIGH (ref 101–111)
Creatinine, Ser: 1.07 mg/dL (ref 0.61–1.24)
GLUCOSE: 141 mg/dL — AB (ref 65–99)
POTASSIUM: 4.2 mmol/L (ref 3.5–5.1)
SODIUM: 145 mmol/L (ref 135–145)
TOTAL PROTEIN: 5 g/dL — AB (ref 6.5–8.1)
Total Bilirubin: 1.5 mg/dL — ABNORMAL HIGH (ref 0.3–1.2)

## 2017-02-08 LAB — POCT I-STAT 3, ART BLOOD GAS (G3+)
ACID-BASE EXCESS: 3 mmol/L — AB (ref 0.0–2.0)
BICARBONATE: 27.4 mmol/L (ref 20.0–28.0)
O2 Saturation: 98 %
PH ART: 7.418 (ref 7.350–7.450)
PO2 ART: 116 mmHg — AB (ref 83.0–108.0)
Patient temperature: 100.8
TCO2: 29 mmol/L (ref 0–100)
pCO2 arterial: 43 mmHg (ref 32.0–48.0)

## 2017-02-08 LAB — BPAM PLATELET PHERESIS
BLOOD PRODUCT EXPIRATION DATE: 201808232359
BLOOD PRODUCT EXPIRATION DATE: 201808232359
ISSUE DATE / TIME: 201808211815
ISSUE DATE / TIME: 201808211322
UNIT TYPE AND RH: 6200
Unit Type and Rh: 7300

## 2017-02-08 LAB — CBC
HCT: 30.5 % — ABNORMAL LOW (ref 39.0–52.0)
Hemoglobin: 10.4 g/dL — ABNORMAL LOW (ref 13.0–17.0)
MCH: 28.6 pg (ref 26.0–34.0)
MCHC: 34.1 g/dL (ref 30.0–36.0)
MCV: 83.8 fL (ref 78.0–100.0)
PLATELETS: 93 10*3/uL — AB (ref 150–400)
RBC: 3.64 MIL/uL — AB (ref 4.22–5.81)
RDW: 14.3 % (ref 11.5–15.5)
WBC: 8.6 10*3/uL (ref 4.0–10.5)

## 2017-02-08 LAB — PREPARE CRYOPRECIPITATE
Unit division: 0
Unit division: 0

## 2017-02-08 LAB — BLOOD PRODUCT ORDER (VERBAL) VERIFICATION

## 2017-02-08 LAB — ABO/RH: ABO/RH(D): AB POS

## 2017-02-08 LAB — LACTIC ACID, PLASMA: LACTIC ACID, VENOUS: 1.3 mmol/L (ref 0.5–1.9)

## 2017-02-08 LAB — MRSA PCR SCREENING: MRSA by PCR: NEGATIVE

## 2017-02-08 LAB — HIV ANTIBODY (ROUTINE TESTING W REFLEX): HIV SCREEN 4TH GENERATION: NONREACTIVE

## 2017-02-08 MED ORDER — INSULIN ASPART 100 UNIT/ML ~~LOC~~ SOLN
0.0000 [IU] | SUBCUTANEOUS | Status: DC
  Administered 2017-02-08: 3 [IU] via SUBCUTANEOUS
  Administered 2017-02-08: 4 [IU] via SUBCUTANEOUS
  Administered 2017-02-08: 3 [IU] via SUBCUTANEOUS
  Administered 2017-02-09: 4 [IU] via SUBCUTANEOUS
  Administered 2017-02-09: 7 [IU] via SUBCUTANEOUS
  Administered 2017-02-09 (×2): 4 [IU] via SUBCUTANEOUS
  Administered 2017-02-09: 3 [IU] via SUBCUTANEOUS
  Administered 2017-02-10 – 2017-02-11 (×5): 4 [IU] via SUBCUTANEOUS
  Administered 2017-02-11: 7 [IU] via SUBCUTANEOUS
  Administered 2017-02-11 – 2017-02-12 (×5): 4 [IU] via SUBCUTANEOUS
  Administered 2017-02-13: 3 [IU] via SUBCUTANEOUS
  Administered 2017-02-13: 4 [IU] via SUBCUTANEOUS
  Administered 2017-02-14: 3 [IU] via SUBCUTANEOUS
  Administered 2017-02-14 (×4): 4 [IU] via SUBCUTANEOUS
  Administered 2017-02-15: 7 [IU] via SUBCUTANEOUS
  Administered 2017-02-15 (×2): 4 [IU] via SUBCUTANEOUS
  Administered 2017-02-15 (×3): 7 [IU] via SUBCUTANEOUS
  Administered 2017-02-16 (×2): 4 [IU] via SUBCUTANEOUS
  Administered 2017-02-16: 7 [IU] via SUBCUTANEOUS
  Administered 2017-02-16: 4 [IU] via SUBCUTANEOUS
  Administered 2017-02-16: 7 [IU] via SUBCUTANEOUS
  Administered 2017-02-16 – 2017-02-17 (×3): 4 [IU] via SUBCUTANEOUS
  Administered 2017-02-17: 7 [IU] via SUBCUTANEOUS
  Administered 2017-02-17: 4 [IU] via SUBCUTANEOUS
  Administered 2017-02-17: 7 [IU] via SUBCUTANEOUS
  Administered 2017-02-17 – 2017-02-18 (×2): 4 [IU] via SUBCUTANEOUS
  Administered 2017-02-18: 7 [IU] via SUBCUTANEOUS
  Administered 2017-02-18: 4 [IU] via SUBCUTANEOUS
  Administered 2017-02-18 (×3): 7 [IU] via SUBCUTANEOUS
  Administered 2017-02-19 (×3): 4 [IU] via SUBCUTANEOUS
  Administered 2017-02-19: 7 [IU] via SUBCUTANEOUS
  Administered 2017-02-19: 3 [IU] via SUBCUTANEOUS
  Administered 2017-02-19 – 2017-02-20 (×5): 4 [IU] via SUBCUTANEOUS
  Administered 2017-02-20: 7 [IU] via SUBCUTANEOUS
  Administered 2017-02-20: 4 [IU] via SUBCUTANEOUS
  Administered 2017-02-21 (×3): 7 [IU] via SUBCUTANEOUS
  Administered 2017-02-21: 3 [IU] via SUBCUTANEOUS
  Administered 2017-02-21 – 2017-02-22 (×4): 4 [IU] via SUBCUTANEOUS
  Administered 2017-02-22 (×2): 7 [IU] via SUBCUTANEOUS
  Administered 2017-02-22: 4 [IU] via SUBCUTANEOUS
  Administered 2017-02-22 – 2017-02-23 (×5): 7 [IU] via SUBCUTANEOUS
  Administered 2017-02-23: 4 [IU] via SUBCUTANEOUS
  Administered 2017-02-23: 7 [IU] via SUBCUTANEOUS
  Administered 2017-02-24: 4 [IU] via SUBCUTANEOUS
  Administered 2017-02-24: 7 [IU] via SUBCUTANEOUS
  Administered 2017-02-24: 4 [IU] via SUBCUTANEOUS
  Administered 2017-02-24: 3 [IU] via SUBCUTANEOUS
  Administered 2017-02-24 – 2017-02-25 (×4): 4 [IU] via SUBCUTANEOUS
  Administered 2017-02-25: 3 [IU] via SUBCUTANEOUS
  Administered 2017-02-26: 4 [IU] via SUBCUTANEOUS
  Administered 2017-02-26: 3 [IU] via SUBCUTANEOUS
  Administered 2017-02-26 (×2): 4 [IU] via SUBCUTANEOUS
  Administered 2017-02-26: 3 [IU] via SUBCUTANEOUS
  Administered 2017-02-27: 4 [IU] via SUBCUTANEOUS
  Administered 2017-02-27 – 2017-02-28 (×5): 3 [IU] via SUBCUTANEOUS
  Administered 2017-02-28 (×2): 4 [IU] via SUBCUTANEOUS
  Administered 2017-02-28 (×3): 3 [IU] via SUBCUTANEOUS
  Administered 2017-03-01: 1 [IU] via SUBCUTANEOUS
  Administered 2017-03-01 (×2): 4 [IU] via SUBCUTANEOUS
  Administered 2017-03-02 (×3): 3 [IU] via SUBCUTANEOUS
  Administered 2017-03-03: 4 [IU] via SUBCUTANEOUS
  Administered 2017-03-03: 3 [IU] via SUBCUTANEOUS
  Administered 2017-03-03 (×3): 4 [IU] via SUBCUTANEOUS
  Administered 2017-03-04 (×4): 3 [IU] via SUBCUTANEOUS
  Administered 2017-03-04 – 2017-03-05 (×3): 4 [IU] via SUBCUTANEOUS
  Administered 2017-03-05: 3 [IU] via SUBCUTANEOUS
  Administered 2017-03-05 (×2): 4 [IU] via SUBCUTANEOUS
  Administered 2017-03-05 – 2017-03-06 (×2): 3 [IU] via SUBCUTANEOUS
  Administered 2017-03-06: 4 [IU] via SUBCUTANEOUS
  Administered 2017-03-06: 3 [IU] via SUBCUTANEOUS
  Administered 2017-03-06: 4 [IU] via SUBCUTANEOUS
  Administered 2017-03-06 – 2017-03-07 (×4): 3 [IU] via SUBCUTANEOUS
  Administered 2017-03-07 (×2): 4 [IU] via SUBCUTANEOUS
  Administered 2017-03-08: 3 [IU] via SUBCUTANEOUS
  Administered 2017-03-08: 4 [IU] via SUBCUTANEOUS
  Administered 2017-03-08 – 2017-03-09 (×7): 3 [IU] via SUBCUTANEOUS
  Administered 2017-03-10: 4 [IU] via SUBCUTANEOUS
  Administered 2017-03-10: 3 [IU] via SUBCUTANEOUS
  Administered 2017-03-10: 4 [IU] via SUBCUTANEOUS
  Administered 2017-03-10 – 2017-03-11 (×2): 3 [IU] via SUBCUTANEOUS
  Administered 2017-03-11 (×2): 4 [IU] via SUBCUTANEOUS
  Administered 2017-03-11: 3 [IU] via SUBCUTANEOUS
  Administered 2017-03-11: 4 [IU] via SUBCUTANEOUS
  Administered 2017-03-12 (×2): 3 [IU] via SUBCUTANEOUS
  Administered 2017-03-12: 4 [IU] via SUBCUTANEOUS
  Administered 2017-03-13: 3 [IU] via SUBCUTANEOUS
  Administered 2017-03-13: 4 [IU] via SUBCUTANEOUS
  Administered 2017-03-13 – 2017-03-14 (×6): 3 [IU] via SUBCUTANEOUS
  Administered 2017-03-15 (×5): 4 [IU] via SUBCUTANEOUS
  Administered 2017-03-16: 3 [IU] via SUBCUTANEOUS
  Administered 2017-03-16 (×2): 4 [IU] via SUBCUTANEOUS
  Administered 2017-03-16: 3 [IU] via SUBCUTANEOUS
  Administered 2017-03-16: 4 [IU] via SUBCUTANEOUS
  Administered 2017-03-16: 3 [IU] via SUBCUTANEOUS
  Administered 2017-03-17 (×2): 4 [IU] via SUBCUTANEOUS
  Administered 2017-03-17: 3 [IU] via SUBCUTANEOUS
  Administered 2017-03-17 (×2): 4 [IU] via SUBCUTANEOUS
  Administered 2017-03-17 – 2017-03-18 (×2): 3 [IU] via SUBCUTANEOUS
  Administered 2017-03-18: 4 [IU] via SUBCUTANEOUS
  Administered 2017-03-18 – 2017-03-19 (×3): 3 [IU] via SUBCUTANEOUS
  Administered 2017-03-19: 4 [IU] via SUBCUTANEOUS
  Administered 2017-03-19 (×2): 3 [IU] via SUBCUTANEOUS
  Administered 2017-03-19: 4 [IU] via SUBCUTANEOUS
  Administered 2017-03-19 – 2017-03-20 (×3): 3 [IU] via SUBCUTANEOUS
  Administered 2017-03-20 (×2): 4 [IU] via SUBCUTANEOUS
  Administered 2017-03-20: 3 [IU] via SUBCUTANEOUS
  Administered 2017-03-20 (×2): 4 [IU] via SUBCUTANEOUS
  Administered 2017-03-21 (×4): 3 [IU] via SUBCUTANEOUS
  Administered 2017-03-21: 4 [IU] via SUBCUTANEOUS
  Administered 2017-03-22 (×3): 3 [IU] via SUBCUTANEOUS
  Administered 2017-03-22: 4 [IU] via SUBCUTANEOUS
  Administered 2017-03-22: 3 [IU] via SUBCUTANEOUS
  Administered 2017-03-23: 4 [IU] via SUBCUTANEOUS

## 2017-02-08 MED ORDER — ACETAMINOPHEN 160 MG/5ML PO SOLN
650.0000 mg | Freq: Four times a day (QID) | ORAL | Status: DC | PRN
  Administered 2017-02-08 – 2017-03-21 (×16): 650 mg
  Filled 2017-02-08 (×18): qty 20.3

## 2017-02-08 MED ORDER — DEXTROSE 50 % IV SOLN
14.0000 mL | Freq: Once | INTRAVENOUS | Status: AC
  Administered 2017-02-08: 14 mL via INTRAVENOUS

## 2017-02-08 MED ORDER — INSULIN GLARGINE 100 UNIT/ML ~~LOC~~ SOLN
10.0000 [IU] | Freq: Every day | SUBCUTANEOUS | Status: DC
  Administered 2017-02-08 – 2017-03-22 (×42): 10 [IU] via SUBCUTANEOUS
  Filled 2017-02-08 (×44): qty 0.1

## 2017-02-08 MED ORDER — DEXTROSE 50 % IV SOLN
INTRAVENOUS | Status: AC
  Filled 2017-02-08: qty 50

## 2017-02-08 MED ORDER — INSULIN ASPART 100 UNIT/ML ~~LOC~~ SOLN
3.0000 [IU] | SUBCUTANEOUS | Status: DC
  Administered 2017-02-08: 3 [IU] via SUBCUTANEOUS

## 2017-02-08 MED ORDER — PIVOT 1.5 CAL PO LIQD
1000.0000 mL | ORAL | Status: DC
  Administered 2017-02-08 – 2017-02-09 (×2): 1000 mL
  Filled 2017-02-08 (×2): qty 1000
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
## 2017-02-08 NOTE — Progress Notes (Signed)
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 sterile drape, hand hygiene, and Betadine prep. Under sterile conditions and general anesthesia, right common femoral artery micropuncture access performed with ultrasound. Images obtained for documentation. Initially, a 5 [place] sheath inserted. C2 catheter was advanced into the contralateral left internal iliac artery anterior division. Selective left internal iliac angiogram performed. Left internal iliac artery: There is diffuse irregularity of the left internal iliac anterior division and its branches in the left pelvic side wall compatible with traumatic vascular injury and scattered areas of dissection. This correlates with the CT finding. Left internal iliac artery anterior division Gel-Foam embolization: Through the 5  French catheter, Gel-Foam slurry embolization performed of the left internal iliac anterior division to near complete stasis. Post embolization angiogram confirms stasis within the left internal iliac anterior division. Posterior division remains patent. Catheter was retracted and flushed. Catheter was advanced into the aorta. Initially SMA was selected with the catheter. SMA angiogram performed. SMA: Main SMA trunk is patent. Jejunal branches are visualized and patent. The catheter was exchanged for a Mickelson  catheter which was reformed over the bifurcation. Mickelson catheter was utilized to select celiac origin. Selective celiac angiogram performed. Celiac artery: Celiac origin is patent. The hepatic, gastroduodenal, left gastric, and splenic arteries are all patent. Over a Glidewire, Mickelson catheter was advanced to select the splenic artery. Selective splenic angiogram performed. Splenic artery: Splenic artery is patent. Pancreatic branches are demonstrated. Parenchymal defects within the spleen noted related to the laceration. No active bleeding angiographically. On the venous phase splenic vein is patent. Renegade STC microcatheter advanced more peripherally within the splenic artery distal to the pancreatic branches. Peripheral splenic arteriogram performed. Although no active bleeding was demonstrated within the spleen there are parenchymal defects compatible with the known intra splenic laceration. Proximal splenic artery coil embolization: Through the microcatheter, 018 interlock coils were utilized for coil embolization ranging in size from 5 - 8 mm. Total of 10 micro coils were deployed into the splenic artery for proximal splenic artery embolization. Following embolization, repeat angiogram confirms stasis within the splenic artery. Microcatheter and 5 French catheter removed. Hemostasis obtained at the right common femoral artery with the Proclose device. No immediate complication.  Patient tolerated the procedure well. IMPRESSION: Successful left internal iliac anterior division Gel-Foam embolization Successful proximal splenic artery micro coil embolization Electronically Signed   By: Jerilynn Mages.  Shick M.D.   On: 02/08/2017 08:14   Ir Angiogram Pelvis Selective Or Supraselective  Result Date: 02/08/2017 INDICATION: Trauma, splenic laceration, pelvic fractures with acute left internal iliac active bleeding by CT secondary to pelvic fractures. EXAM: Ultrasound guidance for vascular access Left internal iliac  catheterization, angiogram and Gel-Foam embolization of the anterior division Selective catheterization and angiogram of the SMA Selective catheterization and angiogram of the celiac artery Advancement of a microcatheter into the splenic artery distal to the pancreatic branches for proximal splenic artery coil embolization MEDICATIONS: Ancef 2 g. The antibiotic was administered within 1 hour of the procedure ANESTHESIA/SEDATION: General anesthesia The patient's level of consciousness and vital signs were monitored continuously by radiology nursing and anesthesia throughout the procedure under my direct supervision. CONTRAST:  120 cc Isovue 370 FLUOROSCOPY TIME:  Fluoroscopy Time: 15 minutes 54 seconds (1,126 mGy). COMPLICATIONS: None immediate. PROCEDURE: Informed consent was obtained from the patient's family following explanation of the procedure, risks, benefits and alternatives. The patient understands, agrees and consents for the procedure. All questions were addressed. A time out was performed prior to the initiation of the procedure. Maximal barrier sterile technique utilized including caps, mask, sterile gowns, sterile gloves, large sterile drape, hand hygiene, and Betadine prep. Under sterile conditions and general anesthesia, right common femoral artery micropuncture access performed with ultrasound. Images obtained for documentation. Initially, a 5 [place] sheath inserted. C2 catheter was advanced into the contralateral left internal iliac artery anterior division. Selective left internal iliac angiogram performed. Left internal iliac artery: There is diffuse irregularity of the left internal iliac anterior division and its branches in the left pelvic side wall compatible with traumatic vascular injury and scattered areas of dissection. This correlates with the CT finding. Left internal iliac artery anterior  division Gel-Foam embolization: Through the 5 French catheter, Gel-Foam slurry embolization  performed of the left internal iliac anterior division to near complete stasis. Post embolization angiogram confirms stasis within the left internal iliac anterior division. Posterior division remains patent. Catheter was retracted and flushed. Catheter was advanced into the aorta. Initially SMA was selected with the catheter. SMA angiogram performed. SMA: Main SMA trunk is patent. Jejunal branches are visualized and patent. The catheter was exchanged for a Mickelson catheter which was reformed over the bifurcation. Mickelson catheter was utilized to select celiac origin. Selective celiac angiogram performed. Celiac artery: Celiac origin is patent. The hepatic, gastroduodenal, left gastric, and splenic arteries are all patent. Over a Glidewire, Mickelson catheter was advanced to select the splenic artery. Selective splenic angiogram performed. Splenic artery: Splenic artery is patent. Pancreatic branches are demonstrated. Parenchymal defects within the spleen noted related to the laceration. No active bleeding angiographically. On the venous phase splenic vein is patent. Renegade STC microcatheter advanced more peripherally within the splenic artery distal to the pancreatic branches. Peripheral splenic arteriogram performed. Although no active bleeding was demonstrated within the spleen there are parenchymal defects compatible with the known intra splenic laceration. Proximal splenic artery coil embolization: Through the microcatheter, 018 interlock coils were utilized for coil embolization ranging in size from 5 - 8 mm. Total of 10 micro coils were deployed into the splenic artery for proximal splenic artery embolization. Following embolization, repeat angiogram confirms stasis within the splenic artery. Microcatheter and 5 French catheter removed. Hemostasis obtained at the right common femoral artery with the Proclose device. No immediate complication.  Patient tolerated the procedure well. IMPRESSION: Successful  left internal iliac anterior division Gel-Foam embolization Successful proximal splenic artery micro coil embolization Electronically Signed   By: Jerilynn Mages.  Shick M.D.   On: 02/08/2017 08:14   Ir Angiogram Selective Each Additional Vessel  Result Date: 02/08/2017 INDICATION: Trauma, splenic laceration, pelvic fractures with acute left internal iliac active bleeding by CT secondary to pelvic fractures. EXAM: Ultrasound guidance for vascular access Left internal iliac catheterization, angiogram and Gel-Foam embolization of the anterior division Selective catheterization and angiogram of the SMA Selective catheterization and angiogram of the celiac artery Advancement of a microcatheter into the splenic artery distal to the pancreatic branches for proximal splenic artery coil embolization MEDICATIONS: Ancef 2 g. The antibiotic was administered within 1 hour of the procedure ANESTHESIA/SEDATION: General anesthesia The patient's level of consciousness and vital signs were monitored continuously by radiology nursing and anesthesia throughout the procedure under my direct supervision. CONTRAST:  120 cc Isovue 370 FLUOROSCOPY TIME:  Fluoroscopy Time: 15 minutes 54 seconds (1,126 mGy). COMPLICATIONS: None immediate. PROCEDURE: Informed consent was obtained from the patient's family following explanation of the procedure, risks, benefits and alternatives. The patient understands, agrees and consents for the procedure. All questions were addressed. A time out was performed prior to the initiation of the procedure. Maximal barrier sterile technique utilized including caps, mask, sterile gowns, sterile gloves, large sterile drape, hand hygiene, and Betadine prep. Under sterile conditions and general anesthesia, right common femoral artery micropuncture access performed with ultrasound. Images obtained for documentation. Initially, a 5 [place] sheath inserted. C2 catheter was advanced into the contralateral left internal iliac artery  anterior division. Selective left internal iliac angiogram performed. Left internal iliac artery: There is diffuse irregularity of the left internal iliac anterior division and its branches in the left pelvic side wall compatible with traumatic vascular injury and scattered areas of dissection. This correlates with the  CT finding. Left internal iliac artery anterior division Gel-Foam embolization: Through the 5 French catheter, Gel-Foam slurry embolization performed of the left internal iliac anterior division to near complete stasis. Post embolization angiogram confirms stasis within the left internal iliac anterior division. Posterior division remains patent. Catheter was retracted and flushed. Catheter was advanced into the aorta. Initially SMA was selected with the catheter. SMA angiogram performed. SMA: Main SMA trunk is patent. Jejunal branches are visualized and patent. The catheter was exchanged for a Mickelson catheter which was reformed over the bifurcation. Mickelson catheter was utilized to select celiac origin. Selective celiac angiogram performed. Celiac artery: Celiac origin is patent. The hepatic, gastroduodenal, left gastric, and splenic arteries are all patent. Over a Glidewire, Mickelson catheter was advanced to select the splenic artery. Selective splenic angiogram performed. Splenic artery: Splenic artery is patent. Pancreatic branches are demonstrated. Parenchymal defects within the spleen noted related to the laceration. No active bleeding angiographically. On the venous phase splenic vein is patent. Renegade STC microcatheter advanced more peripherally within the splenic artery distal to the pancreatic branches. Peripheral splenic arteriogram performed. Although no active bleeding was demonstrated within the spleen there are parenchymal defects compatible with the known intra splenic laceration. Proximal splenic artery coil embolization: Through the microcatheter, 018 interlock coils were  utilized for coil embolization ranging in size from 5 - 8 mm. Total of 10 micro coils were deployed into the splenic artery for proximal splenic artery embolization. Following embolization, repeat angiogram confirms stasis within the splenic artery. Microcatheter and 5 French catheter removed. Hemostasis obtained at the right common femoral artery with the Proclose device. No immediate complication.  Patient tolerated the procedure well. IMPRESSION: Successful left internal iliac anterior division Gel-Foam embolization Successful proximal splenic artery micro coil embolization Electronically Signed   By: Jerilynn Mages.  Shick M.D.   On: 02/08/2017 08:14   Ir Angiogram Selective Each Additional Vessel  Result Date: 02/08/2017 INDICATION: Trauma, splenic laceration, pelvic fractures with acute left internal iliac active bleeding by CT secondary to pelvic fractures. EXAM: Ultrasound guidance for vascular access Left internal iliac catheterization, angiogram and Gel-Foam embolization of the anterior division Selective catheterization and angiogram of the SMA Selective catheterization and angiogram of the celiac artery Advancement of a microcatheter into the splenic artery distal to the pancreatic branches for proximal splenic artery coil embolization MEDICATIONS: Ancef 2 g. The antibiotic was administered within 1 hour of the procedure ANESTHESIA/SEDATION: General anesthesia The patient's level of consciousness and vital signs were monitored continuously by radiology nursing and anesthesia throughout the procedure under my direct supervision. CONTRAST:  120 cc Isovue 370 FLUOROSCOPY TIME:  Fluoroscopy Time: 15 minutes 54 seconds (1,126 mGy). COMPLICATIONS: None immediate. PROCEDURE: Informed consent was obtained from the patient's family following explanation of the procedure, risks, benefits and alternatives. The patient understands, agrees and consents for the procedure. All questions were addressed. A time out was performed  prior to the initiation of the procedure. Maximal barrier sterile technique utilized including caps, mask, sterile gowns, sterile gloves, large sterile drape, hand hygiene, and Betadine prep. Under sterile conditions and general anesthesia, right common femoral artery micropuncture access performed with ultrasound. Images obtained for documentation. Initially, a 5 [place] sheath inserted. C2 catheter was advanced into the contralateral left internal iliac artery anterior division. Selective left internal iliac angiogram performed. Left internal iliac artery: There is diffuse irregularity of the left internal iliac anterior division and its branches in the left pelvic side wall compatible with traumatic vascular injury and scattered areas  of dissection. This correlates with the CT finding. Left internal iliac artery anterior division Gel-Foam embolization: Through the 5 French catheter, Gel-Foam slurry embolization performed of the left internal iliac anterior division to near complete stasis. Post embolization angiogram confirms stasis within the left internal iliac anterior division. Posterior division remains patent. Catheter was retracted and flushed. Catheter was advanced into the aorta. Initially SMA was selected with the catheter. SMA angiogram performed. SMA: Main SMA trunk is patent. Jejunal branches are visualized and patent. The catheter was exchanged for a Mickelson catheter which was reformed over the bifurcation. Mickelson catheter was utilized to select celiac origin. Selective celiac angiogram performed. Celiac artery: Celiac origin is patent. The hepatic, gastroduodenal, left gastric, and splenic arteries are all patent. Over a Glidewire, Mickelson catheter was advanced to select the splenic artery. Selective splenic angiogram performed. Splenic artery: Splenic artery is patent. Pancreatic branches are demonstrated. Parenchymal defects within the spleen noted related to the laceration. No active  bleeding angiographically. On the venous phase splenic vein is patent. Renegade STC microcatheter advanced more peripherally within the splenic artery distal to the pancreatic branches. Peripheral splenic arteriogram performed. Although no active bleeding was demonstrated within the spleen there are parenchymal defects compatible with the known intra splenic laceration. Proximal splenic artery coil embolization: Through the microcatheter, 018 interlock coils were utilized for coil embolization ranging in size from 5 - 8 mm. Total of 10 micro coils were deployed into the splenic artery for proximal splenic artery embolization. Following embolization, repeat angiogram confirms stasis within the splenic artery. Microcatheter and 5 French catheter removed. Hemostasis obtained at the right common femoral artery with the Proclose device. No immediate complication.  Patient tolerated the procedure well. IMPRESSION: Successful left internal iliac anterior division Gel-Foam embolization Successful proximal splenic artery micro coil embolization Electronically Signed   By: Jerilynn Mages.  Shick M.D.   On: 02/08/2017 08:14   Dg Pelvis Portable  Result Date: 02/07/2017 CLINICAL DATA:  Trauma.  Initial encounter. EXAM: PORTABLE PELVIS 1-2 VIEWS COMPARISON:  None. FINDINGS: There is a comminuted fracture of the left inferior pubic ramus with 13 mm medial displacement of a fragment. There is also a mildly displaced fracture of the right acetabulum with disruption of the iliopectineal line suggesting anterior column involvement. There is widening of the pubic symphysis and left SI joint. A nondisplaced right inferior pubic ramus fracture is not excluded. The femoral heads are approximated with the acetabula on this single AP projection. IMPRESSION: 1. Fractures of the right acetabulum and left inferior pubic ramus. 2. Diastasis of the pubic symphysis and left SI joint. Electronically Signed   By: Logan Bores M.D.   On: 02/07/2017 08:39    Dg Pelvis Comp Min 3v  Result Date: 02/07/2017 CLINICAL DATA:  Pelvic ring fractures. EXAM: JUDET PELVIS - 3+ VIEW COMPARISON:  CT earlier this day. FINDINGS: Two screws traverses sacroiliac joints with slight decrease in sacroiliac joint widening. External fixator pins in the iliac wings. Reduced pubic symphysis diastasis. Fracture of left inferior pubic ramus is again seen. Fracture of bilateral puboacetabular junctions, visualized on the right, not as well seen on the left compared to prior CT. There is a Foley catheter in place. Excreted intravenous contrast in the distal left ureter. IMPRESSION: Post bilateral sacroiliac joint fixation with slight decrease widening from prior exam. External fixator placement with resolved pubic symphysis diastasis. Left inferior pubic ramus fracture unchanged alignment. Fractures of the superior puboacetabular junctions, unchanged alignment on the right, not as well visualized on  the left compared to prior CT. Electronically Signed   By: Jeb Levering M.D.   On: 02/07/2017 23:53   Ir US Guide Vasc Access Right  Result Date: 02/08/2017 INDICATION: Trauma, splenic laceration, pelvic fractures with acute left internal iliac active bleeding by CT secondary to pelvic fractures. EXAM: Ultrasound guidance for vascular access Left internal iliac catheterization, angiogram and Gel-Foam embolization of the anterior division Selective catheterization and angiogram of the SMA Selective catheterization and angiogram of the celiac artery Advancement of a microcatheter into the splenic artery distal to the pancreatic branches for proximal splenic artery coil embolization MEDICATIONS: Ancef 2 g. The antibiotic was administered within 1 hour of the procedure ANESTHESIA/SEDATION: General anesthesia The patient's level of consciousness and vital signs were monitored continuously by radiology nursing and anesthesia throughout the procedure under my direct supervision. CONTRAST:  120  cc Isovue 370 FLUOROSCOPY TIME:  Fluoroscopy Time: 15 minutes 54 seconds (1,126 mGy). COMPLICATIONS: None immediate. PROCEDURE: Informed consent was obtained from the patient's family following explanation of the procedure, risks, benefits and alternatives. The patient understands, agrees and consents for the procedure. All questions were addressed. A time out was performed prior to the initiation of the procedure. Maximal barrier sterile technique utilized including caps, mask, sterile gowns, sterile gloves, large sterile drape, hand hygiene, and Betadine prep. Under sterile conditions and general anesthesia, right common femoral artery micropuncture access performed with ultrasound. Images obtained for documentation. Initially, a 5 [place] sheath inserted. C2 catheter was advanced into the contralateral left internal iliac artery anterior division. Selective left internal iliac angiogram performed. Left internal iliac artery: There is diffuse irregularity of the left internal iliac anterior division and its branches in the left pelvic side wall compatible with traumatic vascular injury and scattered areas of dissection. This correlates with the CT finding. Left internal iliac artery anterior division Gel-Foam embolization: Through the 5 French catheter, Gel-Foam slurry embolization performed of the left internal iliac anterior division to near complete stasis. Post embolization angiogram confirms stasis within the left internal iliac anterior division. Posterior division remains patent. Catheter was retracted and flushed. Catheter was advanced into the aorta. Initially SMA was selected with the catheter. SMA angiogram performed. SMA: Main SMA trunk is patent. Jejunal branches are visualized and patent. The catheter was exchanged for a Mickelson catheter which was reformed over the bifurcation. Mickelson catheter was utilized to select celiac origin. Selective celiac angiogram performed. Celiac artery: Celiac origin  is patent. The hepatic, gastroduodenal, left gastric, and splenic arteries are all patent. Over a Glidewire, Mickelson catheter was advanced to select the splenic artery. Selective splenic angiogram performed. Splenic artery: Splenic artery is patent. Pancreatic branches are demonstrated. Parenchymal defects within the spleen noted related to the laceration. No active bleeding angiographically. On the venous phase splenic vein is patent. Renegade STC microcatheter advanced more peripherally within the splenic artery distal to the pancreatic branches. Peripheral splenic arteriogram performed. Although no active bleeding was demonstrated within the spleen there are parenchymal defects compatible with the known intra splenic laceration. Proximal splenic artery coil embolization: Through the microcatheter, 018 interlock coils were utilized for coil embolization ranging in size from 5 - 8 mm. Total of 10 micro coils were deployed into the splenic artery for proximal splenic artery embolization. Following embolization, repeat angiogram confirms stasis within the splenic artery. Microcatheter and 5 French catheter removed. Hemostasis obtained at the right common femoral artery with the Proclose device. No immediate complication.  Patient tolerated the procedure well. IMPRESSION: Successful left internal  iliac anterior division Gel-Foam embolization Successful proximal splenic artery micro coil embolization Electronically Signed   By: Jerilynn Mages.  Shick M.D.   On: 02/08/2017 08:14   Dg Chest Port 1 View  Result Date: 02/08/2017 CLINICAL DATA:  Status post chest trauma with multiple rib fractures treated with chest tubes. EXAM: PORTABLE CHEST 1 VIEW COMPARISON:  Chest x-ray of February 07, 2017 FINDINGS: The lungs are reasonably well inflated. No definite residual left-sided pneumothorax is observed. The pigtail catheter terminates over the left suprahilar region slightly more inferior than on yesterday's study. The interstitial  markings remain coarse in the upper lobes and in the left lower lobe. There is no significant pleural effusion. The heart is normal in size. There is a left internal jugular venous catheter whose tip projects over the proximal SVC. The endotracheal tube tip lies 2.5 cm above the carina. The esophagogastric tube tip projects below the inferior margin of the image. There remains subcutaneous emphysema in the left axillary region and base of the neck on the left. Known multiple left lateral rib fractures are not well demonstrated. IMPRESSION: No residual pneumothorax visible today. The small caliber left chest tube is slightly more inferiorly position today. Interval improvement in interstitial prominence in the upper lobes may reflect resolving edema or contusion. Persistent density at the left lung base is consistent with atelectasis. The support tubes are in reasonable position. Electronically Signed   By: David  Martinique M.D.   On: 02/08/2017 07:31   Dg Chest Portable 1 View  Result Date: 02/07/2017 CLINICAL DATA:  Status post chest tube insertion.  Trauma. EXAM: PORTABLE CHEST 1 VIEW COMPARISON:  Earlier today at 0800 hours. FINDINGS: 1008 hours. Endotracheal tube is felt to terminate 3.1 cm above the carina. An EKG lead/wire artifact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

## 2017-02-08 NOTE — Progress Notes (Signed)
Stiil need fentanyl. Pharmacy called. They will refill this ASAP

## 2017-02-08 NOTE — Progress Notes (Signed)
Inpatient Diabetes Program Recommendations  AACE/ADA: New Consensus Statement on Inpatient Glycemic Control (2015)  Target Ranges:  Prepandial:   less than 140 mg/dL      Peak postprandial:   less than 180 mg/dL (1-2 hours)      Critically ill patients:  140 - 180 mg/dL   Lab Results  Component Value Date   GLUCAP 140 (H) 02/08/2017   HGBA1C 8.6 (H) 02/07/2017    Review of Glycemic Control  Diabetes history: DM2 Outpatient Diabetes medications: metformin 1000 mg bid, amaryl 4 mg QD Current orders for Inpatient glycemic control: Lantus 10 units QHS, Novolog 0-20 units Q4 + 3 units Q4H  HgbA1C - 8.6% - sub-optimal control   Inpatient Diabetes Program Recommendations:    ICU Glycemic control Protocol  Follow.  Thank you. Lorenda Peck, RD, LDN, CDE Inpatient Diabetes Coordinator [phone]
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
## 2017-02-08 NOTE — Progress Notes (Signed)
No fentanyl gtts in pyxis. Notified pharmacy as high priority.
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
## 2017-02-09 LAB — PREPARE PLATELET PHERESIS: Unit division: 0

## 2017-02-09 LAB — GLUCOSE, CAPILLARY
GLUCOSE-CAPILLARY: 218 mg/dL — AB (ref 65–99)
GLUCOSE-CAPILLARY: 184 mg/dL — AB (ref 65–99)
GLUCOSE-CAPILLARY: 181 mg/dL — AB (ref 65–99)
GLUCOSE-CAPILLARY: 179 mg/dL — AB (ref 65–99)
Glucose-Capillary: 149 mg/dL — ABNORMAL HIGH (ref 65–99)
Glucose-Capillary: 151 mg/dL — ABNORMAL HIGH (ref 65–99)

## 2017-02-09 LAB — PREPARE FRESH FROZEN PLASMA
UNIT DIVISION: 0
UNIT DIVISION: 0
UNIT DIVISION: 0
UNIT DIVISION: 0
UNIT DIVISION: 0
UNIT DIVISION: 0
Unit division: 0
Unit division: 0
Unit division: 0
Unit division: 0
Unit division: 0
Unit division: 0
Unit division: 0
Unit division: 0

## 2017-02-09 LAB — BPAM FFP
BLOOD PRODUCT EXPIRATION DATE: 201808232359
BLOOD PRODUCT EXPIRATION DATE: 201808252359
BLOOD PRODUCT EXPIRATION DATE: 201808252359
BLOOD PRODUCT EXPIRATION DATE: 201808262359
BLOOD PRODUCT EXPIRATION DATE: 201808262359
BLOOD PRODUCT EXPIRATION DATE: 201808262359
BLOOD PRODUCT EXPIRATION DATE: 201808262359
Blood Product Expiration Date: 201808232359
Blood Product Expiration Date: 201808232359
Blood Product Expiration Date: 201808252359
Blood Product Expiration Date: 201808262359
Blood Product Expiration Date: 201808262359
Blood Product Expiration Date: 201808262359
Blood Product Expiration Date: 201808262359
ISSUE DATE / TIME: 201808210747
ISSUE DATE / TIME: 201808210843
ISSUE DATE / TIME: 201808211607
ISSUE DATE / TIME: 201808211940
ISSUE DATE / TIME: 201808220401
ISSUE DATE / TIME: 201808221143
ISSUE DATE / TIME: 201808221331
ISSUE DATE / TIME: 201808210747
ISSUE DATE / TIME: 201808210843
ISSUE DATE / TIME: 201808210849
ISSUE DATE / TIME: 201808211607
ISSUE DATE / TIME: 201808211940
ISSUE DATE / TIME: 201808230604
ISSUE DATE / TIME: 201808230604
UNIT TYPE AND RH: 6200
UNIT TYPE AND RH: 6200
UNIT TYPE AND RH: 6200
UNIT TYPE AND RH: 8400
UNIT TYPE AND RH: 8400
UNIT TYPE AND RH: 8400
UNIT TYPE AND RH: 8400
UNIT TYPE AND RH: 8400
UNIT TYPE AND RH: 8400
Unit Type and Rh: 6200
Unit Type and Rh: 6200
Unit Type and Rh: 6200
Unit Type and Rh: 8400
Unit Type and Rh: 8400

## 2017-02-09 LAB — BASIC METABOLIC PANEL WITH GFR
Anion gap: 7 (ref 5–15)
BUN: 19 mg/dL (ref 6–20)
CO2: 22 mmol/L (ref 22–32)
Calcium: 6.7 mg/dL — ABNORMAL LOW (ref 8.9–10.3)
Chloride: 111 mmol/L (ref 101–111)
Creatinine, Ser: 1.21 mg/dL (ref 0.61–1.24)
GFR calc Af Amer: >60 mL/min
GFR calc non Af Amer: >60 mL/min
Glucose, Bld: 222 mg/dL — ABNORMAL HIGH (ref 65–99)
Potassium: 4.4 mmol/L (ref 3.5–5.1)
Sodium: 140 mmol/L (ref 135–145)

## 2017-02-09 LAB — CBC
HEMATOCRIT: 28.5 % — AB (ref 39.0–52.0)
HEMOGLOBIN: 9.5 g/dL — AB (ref 13.0–17.0)
MCH: 28.7 pg (ref 26.0–34.0)
MCHC: 33.3 g/dL (ref 30.0–36.0)
MCV: 86.1 fL (ref 78.0–100.0)
Platelets: 97 10*3/uL — ABNORMAL LOW (ref 150–400)
RBC: 3.31 MIL/uL — ABNORMAL LOW (ref 4.22–5.81)
RDW: 15.3 % (ref 11.5–15.5)
WBC: 8.7 10*3/uL (ref 4.0–10.5)

## 2017-02-09 LAB — BLOOD GAS, ARTERIAL
ACID-BASE DEFICIT: 2.9 mmol/L — AB (ref 0.0–2.0)
Bicarbonate: 21.5 mmol/L (ref 20.0–28.0)
DRAWN BY: 418751
FIO2: 40
O2 Saturation: 94.8 %
PCO2 ART: 37.7 mmHg (ref 32.0–48.0)
PEEP: 8 cmH2O
PO2 ART: 72.9 mmHg — AB (ref 83.0–108.0)
Patient temperature: 98.6
VT: 580 mL
pH, Arterial: 7.374 (ref 7.350–7.450)

## 2017-02-09 LAB — BPAM PLATELET PHERESIS
BLOOD PRODUCT EXPIRATION DATE: 201808242359
ISSUE DATE / TIME: 201808220044
UNIT TYPE AND RH: 7300

## 2017-02-09 MED ORDER — FUROSEMIDE 10 MG/ML IJ SOLN
40.0000 mg | Freq: Once | INTRAMUSCULAR | Status: AC
  Administered 2017-02-09: 40 mg via INTRAVENOUS
  Filled 2017-02-09: qty 4

## 2017-02-09 MED ORDER — CALCIUM GLUCONATE 10 % IV SOLN
2.0000 g | Freq: Once | INTRAVENOUS | Status: AC
  Administered 2017-02-09: 2 g via INTRAVENOUS
  Filled 2017-02-09: qty 20
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
## 2017-02-10 ENCOUNTER — Inpatient Hospital Stay (HOSPITAL_COMMUNITY): Payer: BLUE CROSS/BLUE SHIELD

## 2017-02-10 DIAGNOSIS — S270XXS Traumatic pneumothorax, sequela: Secondary | ICD-10-CM

## 2017-02-10 DIAGNOSIS — J9601 Acute respiratory failure with hypoxia: Secondary | ICD-10-CM

## 2017-02-10 DIAGNOSIS — N179 Acute kidney failure, unspecified: Secondary | ICD-10-CM

## 2017-02-10 DIAGNOSIS — R509 Fever, unspecified: Secondary | ICD-10-CM

## 2017-02-10 LAB — TYPE AND SCREEN
ABO/RH(D): AB POS
Antibody Screen: NEGATIVE
UNIT DIVISION: 0
UNIT DIVISION: 0
UNIT DIVISION: 0
UNIT DIVISION: 0
UNIT DIVISION: 0
UNIT DIVISION: 0
UNIT DIVISION: 0
Unit division: 0
Unit division: 0
Unit division: 0
Unit division: 0
Unit division: 0
Unit division: 0
Unit division: 0
Unit division: 0

## 2017-02-10 LAB — BPAM RBC
BLOOD PRODUCT EXPIRATION DATE: 201809042359
BLOOD PRODUCT EXPIRATION DATE: 201809062359
BLOOD PRODUCT EXPIRATION DATE: 201809132359
BLOOD PRODUCT EXPIRATION DATE: 201809172359
BLOOD PRODUCT EXPIRATION DATE: 201809172359
BLOOD PRODUCT EXPIRATION DATE: 201809172359
BLOOD PRODUCT EXPIRATION DATE: 201809172359
BLOOD PRODUCT EXPIRATION DATE: 201809172359
Blood Product Expiration Date: 201809062359
Blood Product Expiration Date: 201809162359
Blood Product Expiration Date: 201809172359
Blood Product Expiration Date: 201809172359
Blood Product Expiration Date: 201809172359
Blood Product Expiration Date: 201809172359
Blood Product Expiration Date: 201809172359
ISSUE DATE / TIME: 201808210746
ISSUE DATE / TIME: 201808210746
ISSUE DATE / TIME: 201808210837
ISSUE DATE / TIME: 201808210837
ISSUE DATE / TIME: 201808210837
ISSUE DATE / TIME: 201808211445
ISSUE DATE / TIME: 201808211445
ISSUE DATE / TIME: 201808211445
ISSUE DATE / TIME: 201808211445
ISSUE DATE / TIME: 201808211635
ISSUE DATE / TIME: 201808212242
ISSUE DATE / TIME: 201808220533
ISSUE DATE / TIME: 201808231632
ISSUE DATE / TIME: 201808240149
UNIT TYPE AND RH: 6200
UNIT TYPE AND RH: 6200
UNIT TYPE AND RH: 6200
UNIT TYPE AND RH: 6200
UNIT TYPE AND RH: 6200
UNIT TYPE AND RH: 9500
Unit Type and Rh: 6200
Unit Type and Rh: 6200
Unit Type and Rh: 6200
Unit Type and Rh: 6200
Unit Type and Rh: 6200
Unit Type and Rh: 6200
Unit Type and Rh: 6200
Unit Type and Rh: 6200
Unit Type and Rh: 9500

## 2017-02-10 LAB — CBC
HEMATOCRIT: 29.1 % — AB (ref 39.0–52.0)
HEMOGLOBIN: 9.3 g/dL — AB (ref 13.0–17.0)
MCH: 28 pg (ref 26.0–34.0)
MCHC: 32 g/dL (ref 30.0–36.0)
MCV: 87.7 fL (ref 78.0–100.0)
Platelets: 97 10*3/uL — ABNORMAL LOW (ref 150–400)
RBC: 3.32 MIL/uL — ABNORMAL LOW (ref 4.22–5.81)
RDW: 15 % (ref 11.5–15.5)
WBC: 5.4 10*3/uL (ref 4.0–10.5)

## 2017-02-10 LAB — POCT I-STAT 3, ART BLOOD GAS (G3+)
Acid-base deficit: 1 mmol/L (ref 0.0–2.0)
BICARBONATE: 23.1 mmol/L (ref 20.0–28.0)
O2 Saturation: 97 %
PO2 ART: 98 mmHg (ref 83.0–108.0)
TCO2: 24 mmol/L (ref 22–32)
pCO2 arterial: 35.6 mmHg (ref 32.0–48.0)
pH, Arterial: 7.425 (ref 7.350–7.450)

## 2017-02-10 LAB — BASIC METABOLIC PANEL
Anion gap: 9 (ref 5–15)
BUN: 22 mg/dL — ABNORMAL HIGH (ref 6–20)
CHLORIDE: 114 mmol/L — AB (ref 101–111)
CO2: 21 mmol/L — AB (ref 22–32)
CREATININE: 1.31 mg/dL — AB (ref 0.61–1.24)
Calcium: 7.5 mg/dL — ABNORMAL LOW (ref 8.9–10.3)
GFR calc non Af Amer: 59 mL/min — ABNORMAL LOW (ref >60)
Glucose, Bld: 92 mg/dL (ref 65–99)
POTASSIUM: 4.5 mmol/L (ref 3.5–5.1)
Sodium: 144 mmol/L (ref 135–145)

## 2017-02-10 LAB — GLUCOSE, CAPILLARY
GLUCOSE-CAPILLARY: 113 mg/dL — AB (ref 65–99)
GLUCOSE-CAPILLARY: 117 mg/dL — AB (ref 65–99)
GLUCOSE-CAPILLARY: 172 mg/dL — AB (ref 65–99)
Glucose-Capillary: 192 mg/dL — ABNORMAL HIGH (ref 65–99)
Glucose-Capillary: 191 mg/dL — ABNORMAL HIGH (ref 65–99)
Glucose-Capillary: 165 mg/dL — ABNORMAL HIGH (ref 65–99)

## 2017-02-10 LAB — TRIGLYCERIDES: Triglycerides: 341 mg/dL — ABNORMAL HIGH (ref <150)

## 2017-02-10 LAB — PROCALCITONIN: PROCALCITONIN: 9 ng/mL

## 2017-02-10 MED ORDER — DOCUSATE SODIUM 50 MG/5ML PO LIQD
100.0000 mg | Freq: Two times a day (BID) | ORAL | Status: DC
  Administered 2017-02-10 – 2017-03-23 (×57): 100 mg
  Filled 2017-02-10 (×61): qty 10

## 2017-02-10 MED ORDER — SODIUM CHLORIDE 0.9 % IV SOLN
INTRAVENOUS | Status: DC
  Administered 2017-02-10 – 2017-02-11 (×2): via INTRAVENOUS

## 2017-02-10 MED ORDER — METOPROLOL TARTRATE 5 MG/5ML IV SOLN
5.0000 mg | Freq: Once | INTRAVENOUS | Status: AC
  Administered 2017-02-10: 5 mg via INTRAVENOUS

## 2017-02-10 MED ORDER — DEXMEDETOMIDINE HCL 200 MCG/2ML IV SOLN
0.0000 ug/kg/h | INTRAVENOUS | Status: DC
  Administered 2017-02-10: 0.8 ug/kg/h via INTRAVENOUS
  Administered 2017-02-10: 0.4 ug/kg/h via INTRAVENOUS
  Administered 2017-02-10: 0.8 ug/kg/h via INTRAVENOUS
  Filled 2017-02-10 (×5): qty 2

## 2017-02-10 MED ORDER — METOPROLOL TARTRATE 5 MG/5ML IV SOLN
INTRAVENOUS | Status: AC
  Filled 2017-02-10: qty 5

## 2017-02-10 MED ORDER — METOPROLOL TARTRATE 25 MG/10 ML ORAL SUSPENSION
25.0000 mg | Freq: Two times a day (BID) | ORAL | Status: DC
  Administered 2017-02-10 – 2017-02-11 (×3): 25 mg
  Filled 2017-02-10 (×3): qty 10

## 2017-02-10 MED ORDER — PIVOT 1.5 CAL PO LIQD
1000.0000 mL | ORAL | Status: DC
  Administered 2017-02-11: 1000 mL
  Filled 2017-02-10 (×2): qty 1000

## 2017-02-10 MED ORDER — METOPROLOL TARTRATE 5 MG/5ML IV SOLN: 5.0000 mg | Freq: Once | INTRAVENOUS | Status: DC

## 2017-02-10 MED ORDER — DEXMEDETOMIDINE HCL IN NACL 400 MCG/100ML IV SOLN
0.0000 ug/kg/h | INTRAVENOUS | Status: DC
  Administered 2017-02-10: 0.8 ug/kg/h via INTRAVENOUS
  Administered 2017-02-11: 0.7 ug/kg/h via INTRAVENOUS
  Administered 2017-02-11: 0.6 ug/kg/h via INTRAVENOUS
  Administered 2017-02-12: 0.4 ug/kg/h via INTRAVENOUS
  Administered 2017-02-12 – 2017-02-13 (×3): 0.8 ug/kg/h via INTRAVENOUS
  Filled 2017-02-10 (×9): qty 100

## 2017-02-10 MED ORDER — MIDAZOLAM HCL 2 MG/2ML IJ SOLN
2.0000 mg | INTRAMUSCULAR | Status: DC | PRN
  Administered 2017-02-13 – 2017-03-01 (×10): 2 mg via INTRAVENOUS
  Filled 2017-02-10 (×10): qty 2

## 2017-02-10 MED ORDER — MIDAZOLAM HCL 2 MG/2ML IJ SOLN
2.0000 mg | INTRAMUSCULAR | Status: DC | PRN
  Administered 2017-02-10: 2 mg via INTRAVENOUS

## 2017-02-10 NOTE — Plan of Care (Signed)
Problem: Nutrition: Goal: Adequate nutrition will be maintained Outcome: Progressing TF at goal and tolerated

## 2017-02-10 NOTE — Progress Notes (Signed)
Follow up - Trauma and Critical Care  Patient Details:    Peter [name] is an 56 y.o. male.  Lines/tubes : Airway 8 mm (Active)  Secured at (cm) 24 cm 02/10/2017  8:00 AM  Measured From Lips 02/10/2017  8:00 AM  Secured Location Right 02/10/2017  8:00 AM  Secured By [employer] 02/10/2017  8:00 AM  Tube Holder Repositioned Yes 02/10/2017  7:54 AM  Cuff Pressure (cm H2O) 25 cm H2O 02/09/2017 11:33 PM  Site Condition Dry 02/10/2017  8:00 AM     CVC Triple Lumen 02/07/17 Left External jugular 16 cm 0 cm (Active)  Indication for Insertion or Continuance of Line Vasoactive infusions 02/10/2017  8:00 AM  Exposed Catheter (cm) 0 cm 02/07/2017  8:50 AM  Site Assessment Clean;Dry;Intact 02/10/2017  8:00 AM  Proximal Lumen Status Infusing 02/10/2017  8:00 AM  Medial Lumen Status Infusing 02/10/2017  8:00 AM  Distal Lumen Status Infusing 02/10/2017  8:00 AM  Dressing Type Transparent;Occlusive 02/10/2017  8:00 AM  Dressing Status Clean;Dry;Intact;Antimicrobial disc in place 02/10/2017  8:00 AM  Line Care Connections checked and tightened 02/10/2017  8:00 AM  Dressing Change Due 02/14/17 02/10/2017  8:00 AM     Arterial Line 02/07/17 Right Radial (Active)  Site Assessment Clean;Dry;Intact 02/10/2017  8:00 AM  Line Status Pulsatile blood flow 02/10/2017  8:00 AM  Art Line Waveform Appropriate 02/10/2017  8:00 AM  Art Line Interventions Zeroed and calibrated 02/10/2017  8:00 AM  Color/Movement/Sensation Capillary refill less than 3 sec 02/10/2017  8:00 AM  Dressing Type Transparent;Occlusive 02/10/2017  8:00 AM  Dressing Status Clean;Dry;Intact 02/10/2017  8:00 AM     Chest Tube 1 Left Pleural 14 Fr. (Active)  Suction -40 cm H2O 02/10/2017  8:00 AM  Chest Tube Air Leak None 02/10/2017  8:00 AM  Patency Intervention Tip/tilt 02/08/2017  8:00 PM  Drainage Description Bright red 02/10/2017  8:00 AM  Dressing Status Clean;Dry;Intact 02/10/2017  8:00 AM  Dressing Intervention Dressing changed 02/09/2017   8:00 AM  Site Assessment Clean;Dry;Intact 02/10/2017  8:00 AM  Surrounding Skin Intact 02/10/2017  8:00 AM  Output (mL) 50 mL 02/10/2017  6:00 AM     NG/OG Tube Orogastric 16 Fr. Right mouth Xray (Active)  Site Assessment Clean;Dry;Intact 02/10/2017  8:00 AM  Ongoing Placement Verification No change in cm markings or external length of tube from initial placement;No change in respiratory status;No acute changes, not attributed to clinical condition 02/10/2017  8:00 AM  Status Infusing tube feed 02/10/2017  8:00 AM  Drainage Appearance Green 02/08/2017 10:00 AM  Intake (mL) 60 mL 02/08/2017  6:38 PM     Urethral Catheter [name], EMT Double-lumen 16 Fr. (Active)  Indication for Insertion or Continuance of Catheter Peri-operative use for selective surgical procedure 02/10/2017  8:00 AM  Site Assessment Clean;Intact 02/10/2017  8:00 AM  Catheter Maintenance Bag below level of bladder;Catheter secured;Drainage bag/tubing not touching floor;Insertion date on drainage bag;No dependent loops;Seal intact 02/10/2017  8:00 AM  Collection Container Standard drainage bag 02/10/2017  8:00 AM  Securement Method Securing device (Describe) 02/10/2017  8:00 AM  Urinary Catheter Interventions Unclamped 02/10/2017  8:00 AM  Output (mL) 125 mL 02/10/2017  8:00 AM    Microbiology/Sepsis markers: Results for orders placed or performed during the hospital encounter of 02/07/17  MRSA PCR Screening     Status: None   Collection Time: 02/08/17  4:08 AM  Result Value Ref Range Status   MRSA by PCR NEGATIVE NEGATIVE Final  Comment:        The GeneXpert MRSA Assay (FDA approved for NASAL specimens only), is one component of a comprehensive MRSA colonization surveillance program. It is not intended to diagnose MRSA infection nor to guide or monitor treatment for MRSA infections.     Anti-infectives:  Anti-infectives    Start     Dose/Rate Route Frequency Ordered Stop   02/07/17 2000  ceFAZolin (ANCEF) IVPB 1 g/50 mL  premix     1 g 100 mL/hr over 30 Minutes Intravenous Every 8 hours 02/07/17 1416 02/08/17 1543      Best Practice/Protocols:  VTE Prophylaxis: Mechanical GI Prophylaxis: Proton Pump Inhibitor Continous Sedation On propofol with high triglyceride level.  Consults: Treatment Team:  Altamese [place], MD    Events:  Subjective:    Overnight Issues: No new issues.  Will probably go to surgery for his pelvis early next week.  Objective:  Vital signs for last 24 hours: Temp:  [100.4 F (38 C)-102.4 F (39.1 C)] 100.8 F (38.2 C) (08/24 0900) Pulse Rate:  [57-146] 135 (08/24 0900) Resp:  [7-23] 21 (08/24 0900) BP: (99-161)/(58-90) 127/82 (08/24 0900) SpO2:  [95 %-100 %] 96 % (08/24 0900) Arterial Line BP: (84-155)/(53-90) 119/70 (08/24 0900) FiO2 (%):  [40 %] 40 % (08/24 0900)  Hemodynamic parameters for last 24 hours:    Intake/Output from previous day: 08/23 0701 - 08/24 0700 In: 3038.8 [I.V.:2578.8; NG/GT:460] Out: 2530 [Urine:2370; Chest Tube:160]  Intake/Output this shift: Total I/O In: 390.2 [I.V.:330.2; NG/GT:60] Out: 125 [Urine:125]  Vent settings for last 24 hours: Vent Mode: CPAP;PSV FiO2 (%):  [40 %] 40 % Set Rate:  [20 bmp] 20 bmp Vt Set:  [580 mL] 580 mL PEEP:  [8 cmH20] 8 cmH20 Pressure Support:  [15 cmH20] 15 cmH20 Plateau Pressure:  [23 [id number] cmH20] 26 cmH20  Physical Exam:  General: no respiratory distress and will awaken, but still seems delirious Neuro: nonfocal exam, RASS 0 and RASS -1 Resp: clear to auscultation bilaterally and CXR okay.  No PTX CVS: Sinus tachycardia. GI: soft, nontender, BS WNL, no r/g and Tolerating tube feedings. Extremities: edema 2+  Results for orders placed or performed during the hospital encounter of 02/07/17 (from the past 24 hour(s))  Glucose, capillary     Status: Abnormal   Collection Time: 02/09/17 11:56 AM  Result Value Ref Range   Glucose-Capillary 181 (H) 65 - 99 mg/dL  Glucose, capillary      Status: Abnormal   Collection Time: 02/09/17  3:45 PM  Result Value Ref Range   Glucose-Capillary 179 (H) 65 - 99 mg/dL  Glucose, capillary     Status: Abnormal   Collection Time: 02/09/17  8:04 PM  Result Value Ref Range   Glucose-Capillary 149 (H) 65 - 99 mg/dL  Glucose, capillary     Status: Abnormal   Collection Time: 02/09/17 11:55 PM  Result Value Ref Range   Glucose-Capillary 151 (H) 65 - 99 mg/dL   Comment 1 Notify RN    Comment 2 Document in Chart   Glucose, capillary     Status: Abnormal   Collection Time: 02/10/17  3:42 AM  Result Value Ref Range   Glucose-Capillary 113 (H) 65 - 99 mg/dL   Comment 1 Notify RN    Comment 2 Document in Chart   CBC     Status: Abnormal   Collection Time: 02/10/17  4:41 AM  Result Value Ref Range   WBC 5.4 4.0 - 10.5 K/uL   RBC  3.32 (L) 4.22 - 5.81 MIL/uL   Hemoglobin 9.3 (L) 13.0 - 17.0 g/dL   HCT 29.1 (L) 39.0 - 52.0 %   MCV 87.7 78.0 - 100.0 fL   MCH 28.0 26.0 - 34.0 pg   MCHC 32.0 30.0 - 36.0 g/dL   RDW 15.0 11.5 - 15.5 %   Platelets 97 (L) 150 - 400 K/uL  Basic metabolic panel     Status: Abnormal   Collection Time: 02/10/17  4:41 AM  Result Value Ref Range   Sodium 144 135 - 145 mmol/L   Potassium 4.5 3.5 - 5.1 mmol/L   Chloride 114 (H) 101 - 111 mmol/L   CO2 21 (L) 22 - 32 mmol/L   Glucose, Bld 92 65 - 99 mg/dL   BUN 22 (H) 6 - 20 mg/dL   Creatinine, Ser 1.31 (H) 0.61 - 1.24 mg/dL   Calcium 7.5 (L) 8.9 - 10.3 mg/dL   GFR calc non Af Amer 59 (L) >60 mL/min   GFR calc Af Amer >60 >60 mL/min   Anion gap 9 5 - 15  Glucose, capillary     Status: Abnormal   Collection Time: 02/10/17  7:25 AM  Result Value Ref Range   Glucose-Capillary 117 (H) 65 - 99 mg/dL  Triglycerides     Status: Abnormal   Collection Time: 02/10/17  8:18 AM  Result Value Ref Range   Triglycerides 341 (H) <150 mg/dL     Assessment/Plan:   NEURO  Altered Mental Status:  agitation, delirium and sedation   Plan: Adjust sedation appropriately and  stop the Propofol  PULM  Pneumothorax (traumatic)   Plan: CT in place, no air leak.  CARDIO  Sinus Tachycardia   Plan: Hypertensive and tachycardic.  May benefit from increase in beta-blocker  RENAL  Urine output and renal function are fine, but creatinine is 1.21, will need to watch this.   Plan: CPM  GI  No specific issues   Plan: CPM  ID  No known infectious source.   Plan: CPM  HEME  Anemia acute blood loss anemia)   Plan: No blood for now  ENDO No issues   Plan: CPM  Global Issues  Patient doing okay, but probably will not be able to extubate over the weekend.  To have surgery next week on pelvis.  Will increase tube feedings and start Precedex since his triglycerides are elevated.    LOS: 3 days   Additional comments:I reviewed the patient's new clinical lab test results. cbc/bmet/abg and I reviewed the patients new imaging test results. cxr  Critical Care Total Time*: 30 Minutes  Also spoke with the patient's wife.  Mackena Plummer 02/10/2017  *Care during the described time interval was provided by me and/or other providers on the critical care team.  I have reviewed this patient's available data, including medical history, events of note, physical examination and test results as part of my evaluation.
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
## 2017-02-10 NOTE — Progress Notes (Signed)
Pt HR steadily increased to mid 140s throughout shift after being in 110s previously.  Dr. Grandville Silos notified, one time order 5mg  metoprolol given.  Will continue to monitor.

## 2017-02-10 NOTE — Progress Notes (Signed)
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 the procedure. All questions were addressed. A time out was performed prior to the initiation of the procedure. Maximal barrier sterile technique utilized including caps, mask, sterile gowns, sterile gloves, large sterile drape, hand hygiene, and Betadine prep. Under sterile conditions and general anesthesia, right common femoral artery micropuncture access performed with ultrasound. Images obtained for documentation. Initially, a 5 [place] sheath inserted. C2 catheter was advanced into the contralateral left internal iliac artery anterior division. Selective left internal iliac angiogram performed. Left internal iliac artery: There is diffuse irregularity of the left internal iliac anterior division and its branches in the left pelvic side wall  compatible with traumatic vascular injury and scattered areas of dissection. This correlates with the CT finding. Left internal iliac artery anterior division Gel-Foam embolization: Through the 5 French catheter, Gel-Foam slurry embolization performed of the left internal iliac anterior division to near complete stasis. Post embolization angiogram confirms stasis within the left internal iliac anterior division. Posterior division remains patent.  Catheter was retracted and flushed. Catheter was advanced into the aorta. Initially SMA was selected with the catheter. SMA angiogram performed. SMA: Main SMA trunk is patent. Jejunal branches are visualized and patent. The catheter was exchanged for a Mickelson catheter which was reformed over the bifurcation. Mickelson catheter was utilized to select celiac origin. Selective celiac angiogram performed. Celiac artery: Celiac origin is patent. The hepatic, gastroduodenal, left gastric, and splenic arteries are all patent. Over a Glidewire, Mickelson catheter was advanced to select the splenic artery. Selective splenic angiogram performed. Splenic artery: Splenic artery is patent. Pancreatic branches are demonstrated. Parenchymal defects within the spleen noted related to the laceration. No active bleeding angiographically. On the venous phase splenic vein is patent. Renegade STC microcatheter advanced more peripherally within the splenic artery distal to the pancreatic branches. Peripheral splenic arteriogram performed. Although no active bleeding was demonstrated within the spleen there are parenchymal defects compatible with the known intra splenic laceration. Proximal splenic artery coil embolization: Through the microcatheter, 018 interlock coils were utilized for coil embolization ranging in size from 5 - 8 mm. Total of 10 micro coils were deployed into the splenic artery for proximal splenic artery embolization. Following embolization, repeat angiogram confirms stasis within the splenic artery. Microcatheter and 5 French catheter removed. Hemostasis obtained at the right common femoral artery with the Proclose device. No immediate complication.  Patient tolerated the procedure well. IMPRESSION: Successful left internal iliac anterior division Gel-Foam embolization Successful proximal splenic artery micro coil embolization Electronically Signed   By: Jerilynn Mages.  Shick M.D.   On: 02/08/2017 08:14   Ir Angiogram Pelvis  Selective Or Supraselective  Result Date: 02/08/2017 INDICATION: Trauma, splenic laceration, pelvic fractures with acute left internal iliac active bleeding by CT secondary to pelvic fractures. EXAM: Ultrasound guidance for vascular access Left internal iliac catheterization, angiogram and Gel-Foam embolization of the anterior division Selective catheterization and angiogram of the SMA Selective catheterization and angiogram of the celiac artery Advancement of a microcatheter into the splenic artery distal to the pancreatic branches for proximal splenic artery coil embolization MEDICATIONS: Ancef 2 g. The antibiotic was administered within 1 hour of the procedure ANESTHESIA/SEDATION: General anesthesia The patient's level of consciousness and vital signs were monitored continuously by radiology nursing and anesthesia throughout the procedure under my direct supervision. CONTRAST:  120 cc Isovue 370 FLUOROSCOPY TIME:  Fluoroscopy Time: 15 minutes 54 seconds (1,126 mGy). COMPLICATIONS: None immediate. PROCEDURE: Informed consent was obtained from the patient's family following explanation of the procedure, risks, benefits and alternatives. The patient understands, agrees and consents for the procedure. All questions were addressed. A time out was performed prior to the initiation of the procedure. Maximal barrier sterile technique utilized including caps, mask, sterile gowns, sterile gloves, large sterile drape, hand hygiene, and Betadine prep. Under sterile conditions and general anesthesia, right common femoral artery micropuncture access performed with ultrasound. Images obtained for documentation. Initially, a 5 [place] sheath inserted. C2 catheter was advanced into the contralateral left internal iliac artery anterior division. Selective left internal iliac angiogram performed. Left internal iliac artery: There is diffuse irregularity of the left internal iliac anterior division and its branches  in the left  pelvic side wall compatible with traumatic vascular injury and scattered areas of dissection. This correlates with the CT finding. Left internal iliac artery anterior division Gel-Foam embolization: Through the 5 French catheter, Gel-Foam slurry embolization performed of the left internal iliac anterior division to near complete stasis. Post embolization angiogram confirms stasis within the left internal iliac anterior division. Posterior division remains patent. Catheter was retracted and flushed. Catheter was advanced into the aorta. Initially SMA was selected with the catheter. SMA angiogram performed. SMA: Main SMA trunk is patent. Jejunal branches are visualized and patent. The catheter was exchanged for a Mickelson catheter which was reformed over the bifurcation. Mickelson catheter was utilized to select celiac origin. Selective celiac angiogram performed. Celiac artery: Celiac origin is patent. The hepatic, gastroduodenal, left gastric, and splenic arteries are all patent. Over a Glidewire, Mickelson catheter was advanced to select the splenic artery. Selective splenic angiogram performed. Splenic artery: Splenic artery is patent. Pancreatic branches are demonstrated. Parenchymal defects within the spleen noted related to the laceration. No active bleeding angiographically. On the venous phase splenic vein is patent. Renegade STC microcatheter advanced more peripherally within the splenic artery distal to the pancreatic branches. Peripheral splenic arteriogram performed. Although no active bleeding was demonstrated within the spleen there are parenchymal defects compatible with the known intra splenic laceration. Proximal splenic artery coil embolization: Through the microcatheter, 018 interlock coils were utilized for coil embolization ranging in size from 5 - 8 mm. Total of 10 micro coils were deployed into the splenic artery for proximal splenic artery embolization. Following embolization, repeat  angiogram confirms stasis within the splenic artery. Microcatheter and 5 French catheter removed. Hemostasis obtained at the right common femoral artery with the Proclose device. No immediate complication.  Patient tolerated the procedure well. IMPRESSION: Successful left internal iliac anterior division Gel-Foam embolization Successful proximal splenic artery micro coil embolization Electronically Signed   By: Jerilynn Mages.  Shick M.D.   On: 02/08/2017 08:14   Ir Angiogram Selective Each Additional Vessel  Result Date: 02/08/2017 INDICATION: Trauma, splenic laceration, pelvic fractures with acute left internal iliac active bleeding by CT secondary to pelvic fractures. EXAM: Ultrasound guidance for vascular access Left internal iliac catheterization, angiogram and Gel-Foam embolization of the anterior division Selective catheterization and angiogram of the SMA Selective catheterization and angiogram of the celiac artery Advancement of a microcatheter into the splenic artery distal to the pancreatic branches for proximal splenic artery coil embolization MEDICATIONS: Ancef 2 g. The antibiotic was administered within 1 hour of the procedure ANESTHESIA/SEDATION: General anesthesia The patient's level of consciousness and vital signs were monitored continuously by radiology nursing and anesthesia throughout the procedure under my direct supervision. CONTRAST:  120 cc Isovue 370 FLUOROSCOPY TIME:  Fluoroscopy Time: 15 minutes 54 seconds (1,126 mGy). COMPLICATIONS: None immediate. PROCEDURE: Informed consent was obtained from the patient's family following explanation of the procedure, risks, benefits and alternatives. The patient understands, agrees and consents for the procedure. All questions were addressed. A time out was performed prior to the initiation of the procedure. Maximal barrier sterile technique utilized including caps, mask, sterile gowns, sterile gloves, large sterile drape, hand hygiene, and Betadine prep. Under  sterile conditions and general anesthesia, right common femoral artery micropuncture access performed with ultrasound. Images obtained for documentation. Initially, a 5 [place] sheath inserted. C2 catheter was advanced into the contralateral left internal iliac artery anterior division. Selective left internal iliac angiogram performed. Left internal iliac artery: There is diffuse irregularity of the left  internal iliac anterior division and its branches in the left pelvic side wall compatible with traumatic vascular injury and scattered areas of dissection. This correlates with the CT finding. Left internal iliac artery anterior division Gel-Foam embolization: Through the 5 French catheter, Gel-Foam slurry embolization performed of the left internal iliac anterior division to near complete stasis. Post embolization angiogram confirms stasis within the left internal iliac anterior division. Posterior division remains patent. Catheter was retracted and flushed. Catheter was advanced into the aorta. Initially SMA was selected with the catheter. SMA angiogram performed. SMA: Main SMA trunk is patent. Jejunal branches are visualized and patent. The catheter was exchanged for a Mickelson catheter which was reformed over the bifurcation. Mickelson catheter was utilized to select celiac origin. Selective celiac angiogram performed. Celiac artery: Celiac origin is patent. The hepatic, gastroduodenal, left gastric, and splenic arteries are all patent. Over a Glidewire, Mickelson catheter was advanced to select the splenic artery. Selective splenic angiogram performed. Splenic artery: Splenic artery is patent. Pancreatic branches are demonstrated. Parenchymal defects within the spleen noted related to the laceration. No active bleeding angiographically. On the venous phase splenic vein is patent. Renegade STC microcatheter advanced more peripherally within the splenic artery distal to the pancreatic branches. Peripheral  splenic arteriogram performed. Although no active bleeding was demonstrated within the spleen there are parenchymal defects compatible with the known intra splenic laceration. Proximal splenic artery coil embolization: Through the microcatheter, 018 interlock coils were utilized for coil embolization ranging in size from 5 - 8 mm. Total of 10 micro coils were deployed into the splenic artery for proximal splenic artery embolization. Following embolization, repeat angiogram confirms stasis within the splenic artery. Microcatheter and 5 French catheter removed. Hemostasis obtained at the right common femoral artery with the Proclose device. No immediate complication.  Patient tolerated the procedure well. IMPRESSION: Successful left internal iliac anterior division Gel-Foam embolization Successful proximal splenic artery micro coil embolization Electronically Signed   By: Jerilynn Mages.  Shick M.D.   On: 02/08/2017 08:14   Ir Angiogram Selective Each Additional Vessel  Result Date: 02/08/2017 INDICATION: Trauma, splenic laceration, pelvic fractures with acute left internal iliac active bleeding by CT secondary to pelvic fractures. EXAM: Ultrasound guidance for vascular access Left internal iliac catheterization, angiogram and Gel-Foam embolization of the anterior division Selective catheterization and angiogram of the SMA Selective catheterization and angiogram of the celiac artery Advancement of a microcatheter into the splenic artery distal to the pancreatic branches for proximal splenic artery coil embolization MEDICATIONS: Ancef 2 g. The antibiotic was administered within 1 hour of the procedure ANESTHESIA/SEDATION: General anesthesia The patient's level of consciousness and vital signs were monitored continuously by radiology nursing and anesthesia throughout the procedure under my direct supervision. CONTRAST:  120 cc Isovue 370 FLUOROSCOPY TIME:  Fluoroscopy Time: 15 minutes 54 seconds (1,126 mGy). COMPLICATIONS: None  immediate. PROCEDURE: Informed consent was obtained from the patient's family following explanation of the procedure, risks, benefits and alternatives. The patient understands, agrees and consents for the procedure. All questions were addressed. A time out was performed prior to the initiation of the procedure. Maximal barrier sterile technique utilized including caps, mask, sterile gowns, sterile gloves, large sterile drape, hand hygiene, and Betadine prep. Under sterile conditions and general anesthesia, right common femoral artery micropuncture access performed with ultrasound. Images obtained for documentation. Initially, a 5 [place] sheath inserted. C2 catheter was advanced into the contralateral left internal iliac artery anterior division. Selective left internal iliac angiogram performed. Left internal iliac artery: There  is diffuse irregularity of the left internal iliac anterior division and its branches in the left pelvic side wall compatible with traumatic vascular injury and scattered areas of dissection. This correlates with the CT finding. Left internal iliac artery anterior division Gel-Foam embolization: Through the 5 French catheter, Gel-Foam slurry embolization performed of the left internal iliac anterior division to near complete stasis. Post embolization angiogram confirms stasis within the left internal iliac anterior division. Posterior division remains patent. Catheter was retracted and flushed. Catheter was advanced into the aorta. Initially SMA was selected with the catheter. SMA angiogram performed. SMA: Main SMA trunk is patent. Jejunal branches are visualized and patent. The catheter was exchanged for a Mickelson catheter which was reformed over the bifurcation. Mickelson catheter was utilized to select celiac origin. Selective celiac angiogram performed. Celiac artery: Celiac origin is patent. The hepatic, gastroduodenal, left gastric, and splenic arteries are all patent. Over a  Glidewire, Mickelson catheter was advanced to select the splenic artery. Selective splenic angiogram performed. Splenic artery: Splenic artery is patent. Pancreatic branches are demonstrated. Parenchymal defects within the spleen noted related to the laceration. No active bleeding angiographically. On the venous phase splenic vein is patent. Renegade STC microcatheter advanced more peripherally within the splenic artery distal to the pancreatic branches. Peripheral splenic arteriogram performed. Although no active bleeding was demonstrated within the spleen there are parenchymal defects compatible with the known intra splenic laceration. Proximal splenic artery coil embolization: Through the microcatheter, 018 interlock coils were utilized for coil embolization ranging in size from 5 - 8 mm. Total of 10 micro coils were deployed into the splenic artery for proximal splenic artery embolization. Following embolization, repeat angiogram confirms stasis within the splenic artery. Microcatheter and 5 French catheter removed. Hemostasis obtained at the right common femoral artery with the Proclose device. No immediate complication.  Patient tolerated the procedure well. IMPRESSION: Successful left internal iliac anterior division Gel-Foam embolization Successful proximal splenic artery micro coil embolization Electronically Signed   By: Jerilynn Mages.  Shick M.D.   On: 02/08/2017 08:14   Dg Pelvis Portable  Result Date: 02/07/2017 CLINICAL DATA:  Trauma.  Initial encounter. EXAM: PORTABLE PELVIS 1-2 VIEWS COMPARISON:  None. FINDINGS: There is a comminuted fracture of the left inferior pubic ramus with 13 mm medial displacement of a fragment. There is also a mildly displaced fracture of the right acetabulum with disruption of the iliopectineal line suggesting anterior column involvement. There is widening of the pubic symphysis and left SI joint. A nondisplaced right inferior pubic ramus fracture is not excluded. The femoral heads  are approximated with the acetabula on this single AP projection. IMPRESSION: 1. Fractures of the right acetabulum and left inferior pubic ramus. 2. Diastasis of the pubic symphysis and left SI joint. Electronically Signed   By: Logan Bores M.D.   On: 02/07/2017 08:39   Dg Pelvis Comp Min 3v  Result Date: 02/07/2017 CLINICAL DATA:  Pelvic ring fractures. EXAM: JUDET PELVIS - 3+ VIEW COMPARISON:  CT earlier this day. FINDINGS: Two screws traverses sacroiliac joints with slight decrease in sacroiliac joint widening. External fixator pins in the iliac wings. Reduced pubic symphysis diastasis. Fracture of left inferior pubic ramus is again seen. Fracture of bilateral puboacetabular junctions, visualized on the right, not as well seen on the left compared to prior CT. There is a Foley catheter in place. Excreted intravenous contrast in the distal left ureter. IMPRESSION: Post bilateral sacroiliac joint fixation with slight decrease widening from prior exam. External fixator placement with  resolved pubic symphysis diastasis. Left inferior pubic ramus fracture unchanged alignment. Fractures of the superior puboacetabular junctions, unchanged alignment on the right, not as well visualized on the left compared to prior CT. Electronically Signed   By: Jeb Levering M.D.   On: 02/07/2017 23:53   Ir US Guide Vasc Access Right  Result Date: 02/08/2017 INDICATION: Trauma, splenic laceration, pelvic fractures with acute left internal iliac active bleeding by CT secondary to pelvic fractures. EXAM: Ultrasound guidance for vascular access Left internal iliac catheterization, angiogram and Gel-Foam embolization of the anterior division Selective catheterization and angiogram of the SMA Selective catheterization and angiogram of the celiac artery Advancement of a microcatheter into the splenic artery distal to the pancreatic branches for proximal splenic artery coil embolization MEDICATIONS: Ancef 2 g. The antibiotic was  administered within 1 hour of the procedure ANESTHESIA/SEDATION: General anesthesia The patient's level of consciousness and vital signs were monitored continuously by radiology nursing and anesthesia throughout the procedure under my direct supervision. CONTRAST:  120 cc Isovue 370 FLUOROSCOPY TIME:  Fluoroscopy Time: 15 minutes 54 seconds (1,126 mGy). COMPLICATIONS: None immediate. PROCEDURE: Informed consent was obtained from the patient's family following explanation of the procedure, risks, benefits and alternatives. The patient understands, agrees and consents for the procedure. All questions were addressed. A time out was performed prior to the initiation of the procedure. Maximal barrier sterile technique utilized including caps, mask, sterile gowns, sterile gloves, large sterile drape, hand hygiene, and Betadine prep. Under sterile conditions and general anesthesia, right common femoral artery micropuncture access performed with ultrasound. Images obtained for documentation. Initially, a 5 [place] sheath inserted. C2 catheter was advanced into the contralateral left internal iliac artery anterior division. Selective left internal iliac angiogram performed. Left internal iliac artery: There is diffuse irregularity of the left internal iliac anterior division and its branches in the left pelvic side wall compatible with traumatic vascular injury and scattered areas of dissection. This correlates with the CT finding. Left internal iliac artery anterior division Gel-Foam embolization: Through the 5 French catheter, Gel-Foam slurry embolization performed of the left internal iliac anterior division to near complete stasis. Post embolization angiogram confirms stasis within the left internal iliac anterior division. Posterior division remains patent. Catheter was retracted and flushed. Catheter was advanced into the aorta. Initially SMA was selected with the catheter. SMA angiogram performed. SMA: Main SMA trunk  is patent. Jejunal branches are visualized and patent. The catheter was exchanged for a Mickelson catheter which was reformed over the bifurcation. Mickelson catheter was utilized to select celiac origin. Selective celiac angiogram performed. Celiac artery: Celiac origin is patent. The hepatic, gastroduodenal, left gastric, and splenic arteries are all patent. Over a Glidewire, Mickelson catheter was advanced to select the splenic artery. Selective splenic angiogram performed. Splenic artery: Splenic artery is patent. Pancreatic branches are demonstrated. Parenchymal defects within the spleen noted related to the laceration. No active bleeding angiographically. On the venous phase splenic vein is patent. Renegade STC microcatheter advanced more peripherally within the splenic artery distal to the pancreatic branches. Peripheral splenic arteriogram performed. Although no active bleeding was demonstrated within the spleen there are parenchymal defects compatible with the known intra splenic laceration. Proximal splenic artery coil embolization: Through the microcatheter, 018 interlock coils were utilized for coil embolization ranging in size from 5 - 8 mm. Total of 10 micro coils were deployed into the splenic artery for proximal splenic artery embolization. Following embolization, repeat angiogram confirms stasis within the splenic artery. Microcatheter and 5 [place]  catheter removed. Hemostasis obtained at the right common femoral artery with the Proclose device. No immediate complication.  Patient tolerated the procedure well. IMPRESSION: Successful left internal iliac anterior division Gel-Foam embolization Successful proximal splenic artery micro coil embolization Electronically Signed   By: Jerilynn Mages.  Shick M.D.   On: 02/08/2017 08:14   Dg Chest Port 1 View  Result Date: 02/10/2017 CLINICAL DATA:  Hypoxia EXAM: PORTABLE CHEST 1 VIEW COMPARISON:  February 08, 2017 FINDINGS: Endotracheal tube tip is 6.5 cm above the  carina. Central catheter tip is in the superior vena cava. Nasogastric tube tip and side port below the diaphragm. Chest catheter is noted on the left, unchanged in position. No evident pneumothorax. There is subcutaneous air on the left. There is a small left pleural effusion with patchy airspace opacity in the left lower lobe. Right lung is clear. Heart size and pulmonary vascularity are within normal limits. No adenopathy. Prior rib fractures on the left. IMPRESSION: No evident pneumothorax. Subcutaneous air is again noted on the left side, somewhat less than on recent prior study. There is a small left pleural effusion with atelectatic change in the left base. There may be superimposed contusion and/ or pneumonia in this area. Right lung is clear. Stable cardiac silhouette. Electronically Signed   By: Lowella Grip III M.D.   On: 02/10/2017 07:11   Dg Chest Port 1 View  Result Date: 02/08/2017 CLINICAL DATA:  Status post chest trauma with multiple rib fractures treated with chest tubes. EXAM: PORTABLE CHEST 1 VIEW COMPARISON:  Chest x-ray of February 07, 2017 FINDINGS: The lungs are reasonably well inflated. No definite residual left-sided pneumothorax is observed. The pigtail catheter terminates over the left suprahilar region slightly more inferior than on yesterday's study. The interstitial

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
## 2017-02-11 ENCOUNTER — Encounter (HOSPITAL_COMMUNITY): Payer: Self-pay | Admitting: *Deleted

## 2017-02-11 ENCOUNTER — Inpatient Hospital Stay (HOSPITAL_COMMUNITY): Payer: BLUE CROSS/BLUE SHIELD

## 2017-02-11 DIAGNOSIS — S27322A Contusion of lung, bilateral, initial encounter: Secondary | ICD-10-CM

## 2017-02-11 DIAGNOSIS — R4182 Altered mental status, unspecified: Secondary | ICD-10-CM

## 2017-02-11 DIAGNOSIS — S32009A Unspecified fracture of unspecified lumbar vertebra, initial encounter for closed fracture: Secondary | ICD-10-CM

## 2017-02-11 DIAGNOSIS — S22009A Unspecified fracture of unspecified thoracic vertebra, initial encounter for closed fracture: Secondary | ICD-10-CM

## 2017-02-11 LAB — CBC WITH DIFFERENTIAL/PLATELET
Basophils Absolute: 0 10*3/uL (ref 0.0–0.1)
Basophils Relative: 0 %
Eosinophils Absolute: 0 10*3/uL (ref 0.0–0.7)
Eosinophils Relative: 1 %
HEMATOCRIT: 25.7 % — AB (ref 39.0–52.0)
HEMOGLOBIN: 8.4 g/dL — AB (ref 13.0–17.0)
LYMPHS ABS: 0.3 10*3/uL — AB (ref 0.7–4.0)
LYMPHS PCT: 5 %
MCH: 28.9 pg (ref 26.0–34.0)
MCHC: 32.7 g/dL (ref 30.0–36.0)
MCV: 88.3 fL (ref 78.0–100.0)
MONOS PCT: 5 %
Monocytes Absolute: 0.2 10*3/uL (ref 0.1–1.0)
NEUTROS ABS: 4.6 10*3/uL (ref 1.7–7.7)
NEUTROS PCT: 90 %
Platelets: 82 10*3/uL — ABNORMAL LOW (ref 150–400)
RBC: 2.91 MIL/uL — AB (ref 4.22–5.81)
RDW: 15.1 % (ref 11.5–15.5)
WBC: 5.1 10*3/uL (ref 4.0–10.5)

## 2017-02-11 LAB — URINALYSIS, ROUTINE W REFLEX MICROSCOPIC
Glucose, UA: 50 mg/dL — AB
KETONES UR: NEGATIVE mg/dL
Leukocytes, UA: NEGATIVE
Nitrite: NEGATIVE
PROTEIN: 100 mg/dL — AB
Specific Gravity, Urine: >1.046 — ABNORMAL HIGH (ref 1.005–1.030)
pH: 5 (ref 5.0–8.0)

## 2017-02-11 LAB — COMPREHENSIVE METABOLIC PANEL
ALT: 45 U/L (ref 17–63)
ANION GAP: 7 (ref 5–15)
AST: 61 U/L — ABNORMAL HIGH (ref 15–41)
Albumin: 2.1 g/dL — ABNORMAL LOW (ref 3.5–5.0)
Alkaline Phosphatase: 190 U/L — ABNORMAL HIGH (ref 38–126)
BUN: 37 mg/dL — ABNORMAL HIGH (ref 6–20)
CHLORIDE: 118 mmol/L — AB (ref 101–111)
CO2: 19 mmol/L — AB (ref 22–32)
CREATININE: 1.45 mg/dL — AB (ref 0.61–1.24)
Calcium: 7.3 mg/dL — ABNORMAL LOW (ref 8.9–10.3)
GFR, EST NON AFRICAN AMERICAN: 52 mL/min — AB (ref >60)
Glucose, Bld: 201 mg/dL — ABNORMAL HIGH (ref 65–99)
POTASSIUM: 4.7 mmol/L (ref 3.5–5.1)
SODIUM: 144 mmol/L (ref 135–145)
Total Bilirubin: 3.8 mg/dL — ABNORMAL HIGH (ref 0.3–1.2)
Total Protein: 4.9 g/dL — ABNORMAL LOW (ref 6.5–8.1)

## 2017-02-11 LAB — BLOOD GAS, ARTERIAL
ACID-BASE DEFICIT: 6.6 mmol/L — AB (ref 0.0–2.0)
Bicarbonate: 18.4 mmol/L — ABNORMAL LOW (ref 20.0–28.0)
Drawn by: 437071
FIO2: 40
O2 SAT: 96.4 %
PEEP: 8 cmH2O
PH ART: 7.324 — AB (ref 7.350–7.450)
Patient temperature: 98.4
RATE: 20 resp/min
VT: 580 mL
pCO2 arterial: 36.4 mmHg (ref 32.0–48.0)
pO2, Arterial: 87.9 mmHg (ref 83.0–108.0)

## 2017-02-11 LAB — GLUCOSE, CAPILLARY
GLUCOSE-CAPILLARY: 56 mg/dL — AB (ref 65–99)
GLUCOSE-CAPILLARY: 99 mg/dL (ref 65–99)
Glucose-Capillary: 166 mg/dL — ABNORMAL HIGH (ref 65–99)
Glucose-Capillary: 182 mg/dL — ABNORMAL HIGH (ref 65–99)
Glucose-Capillary: 97 mg/dL (ref 65–99)

## 2017-02-11 LAB — PROCALCITONIN: PROCALCITONIN: 13.25 ng/mL

## 2017-02-11 LAB — FIBRINOGEN: Fibrinogen: 797 mg/dL — ABNORMAL HIGH (ref 210–475)

## 2017-02-11 LAB — PROTIME-INR
INR: 1.3
PROTHROMBIN TIME: 16.3 s — AB (ref 11.4–15.2)

## 2017-02-11 LAB — LACTIC ACID, PLASMA
LACTIC ACID, VENOUS: 2.5 mmol/L — AB (ref 0.5–1.9)
Lactic Acid, Venous: 1.9 mmol/L (ref 0.5–1.9)

## 2017-02-11 LAB — MAGNESIUM: MAGNESIUM: 1.6 mg/dL — AB (ref 1.7–2.4)

## 2017-02-11 LAB — TRIGLYCERIDES: TRIGLYCERIDES: 267 mg/dL — AB (ref <150)

## 2017-02-11 LAB — PHOSPHORUS: PHOSPHORUS: 2.4 mg/dL — AB (ref 2.5–4.6)

## 2017-02-11 LAB — PREPARE RBC (CROSSMATCH)

## 2017-02-11 LAB — HEMOGLOBIN AND HEMATOCRIT, BLOOD
HEMATOCRIT: 27.8 % — AB (ref 39.0–52.0)
HEMOGLOBIN: 9.2 g/dL — AB (ref 13.0–17.0)

## 2017-02-11 LAB — AMMONIA: AMMONIA: 40 umol/L — AB (ref 9–35)

## 2017-02-11 LAB — APTT: APTT: 25 s (ref 24–36)

## 2017-02-11 MED ORDER — MAGNESIUM SULFATE 2 GM/50ML IV SOLN
2.0000 g | Freq: Once | INTRAVENOUS | Status: AC
  Administered 2017-02-11: 2 g via INTRAVENOUS
  Filled 2017-02-11: qty 50

## 2017-02-11 MED ORDER — DEXTROSE 50 % IV SOLN
50.0000 mL | Freq: Once | INTRAVENOUS | Status: AC
  Administered 2017-02-11: 25 mL via INTRAVENOUS

## 2017-02-11 MED ORDER — SODIUM CHLORIDE 0.9 % IV SOLN
0.0000 ug/min | INTRAVENOUS | Status: DC
  Administered 2017-02-11: 110 ug/min via INTRAVENOUS
  Administered 2017-02-11: 20 ug/min via INTRAVENOUS
  Administered 2017-02-12: 30 ug/min via INTRAVENOUS
  Administered 2017-02-12 (×2): 90 ug/min via INTRAVENOUS
  Administered 2017-02-12: 110 ug/min via INTRAVENOUS
  Administered 2017-02-12: 20 ug/min via INTRAVENOUS
  Administered 2017-02-12: 110 ug/min via INTRAVENOUS
  Administered 2017-02-13: 15 ug/min via INTRAVENOUS
  Administered 2017-02-13: 30 ug/min via INTRAVENOUS
  Administered 2017-02-13: 25 ug/min via INTRAVENOUS
  Filled 2017-02-11 (×12): qty 1

## 2017-02-11 MED ORDER — NALOXONE HCL 0.4 MG/ML IJ SOLN
0.4000 mg | INTRAMUSCULAR | Status: DC | PRN
  Administered 2017-02-11: 0.4 mg via INTRAVENOUS

## 2017-02-11 MED ORDER — LORAZEPAM 2 MG/ML IJ SOLN
2.0000 mg | Freq: Once | INTRAMUSCULAR | Status: AC
  Administered 2017-02-11: 2 mg via INTRAVENOUS

## 2017-02-11 MED ORDER — PIPERACILLIN-TAZOBACTAM 3.375 G IVPB
3.3750 g | Freq: Three times a day (TID) | INTRAVENOUS | Status: DC
  Administered 2017-02-11 – 2017-02-14 (×9): 3.375 g via INTRAVENOUS
  Filled 2017-02-11 (×11): qty 50

## 2017-02-11 MED ORDER — LORAZEPAM 2 MG/ML IJ SOLN
INTRAMUSCULAR | Status: AC
  Filled 2017-02-11: qty 1

## 2017-02-11 MED ORDER — LACTATED RINGERS IV SOLN
INTRAVENOUS | Status: DC
  Administered 2017-02-11 – 2017-02-17 (×7): via INTRAVENOUS

## 2017-02-11 MED ORDER — LORAZEPAM 2 MG/ML IJ SOLN: 2.0000 mg | Freq: Once | INTRAMUSCULAR | Status: AC

## 2017-02-11 MED ORDER — SODIUM CHLORIDE 0.9 % IV SOLN
Freq: Once | INTRAVENOUS | Status: AC
  Administered 2017-02-11: 14:00:00 via INTRAVENOUS

## 2017-02-11 MED ORDER — DEXTROSE 50 % IV SOLN
INTRAVENOUS | Status: AC
  Filled 2017-02-11: qty 50

## 2017-02-11 MED ORDER — NALOXONE HCL 0.4 MG/ML IJ SOLN
INTRAMUSCULAR | Status: AC
  Filled 2017-02-11: qty 1

## 2017-02-11 MED ORDER — IOPAMIDOL (ISOVUE-370) INJECTION 76%
INTRAVENOUS | Status: AC
  Administered 2017-02-11: 50 mL
  Filled 2017-02-11: qty 50

## 2017-02-11 MED ORDER — PANTOPRAZOLE SODIUM 40 MG PO PACK
40.0000 mg | PACK | ORAL | Status: DC
  Administered 2017-02-12 – 2017-03-22 (×38): 40 mg
  Filled 2017-02-11 (×38): qty 20

## 2017-02-11 MED ORDER — SODIUM CHLORIDE 0.9 % IV SOLN
1000.0000 mg | Freq: Once | INTRAVENOUS | Status: AC
  Administered 2017-02-11: 1000 mg via INTRAVENOUS
  Filled 2017-02-11: qty 10

## 2017-02-11 MED ORDER — SODIUM CHLORIDE 0.9 % IV SOLN
500.0000 mg | Freq: Two times a day (BID) | INTRAVENOUS | Status: DC
  Administered 2017-02-11 – 2017-03-23 (×80): 500 mg via INTRAVENOUS
  Filled 2017-02-11 (×82): qty 5

## 2017-02-11 MED ORDER — SODIUM CHLORIDE 0.9 % IV BOLUS (SEPSIS)
1000.0000 mL | Freq: Once | INTRAVENOUS | Status: AC
  Administered 2017-02-11: 1000 mL via INTRAVENOUS

## 2017-02-11 MED ORDER — LORAZEPAM 2 MG/ML IJ SOLN
INTRAMUSCULAR | Status: AC
  Administered 2017-02-11: 2 mg
  Filled 2017-02-11: qty 1

## 2017-02-11 MED ORDER — VANCOMYCIN HCL IN DEXTROSE 750-5 MG/150ML-% IV SOLN
750.0000 mg | Freq: Two times a day (BID) | INTRAVENOUS | Status: DC
  Administered 2017-02-11 – 2017-02-13 (×6): 750 mg via INTRAVENOUS
  Filled 2017-02-11 (×7): qty 150

## 2017-02-11 NOTE — Progress Notes (Signed)
Alerted to pt HR in 40s.  Arrived in pt room to find him lethargic, low BP 50s.  Pt no longer following commands, not responsive to painful stimuli.  Upon assessment pt exhibiting nystagmus, with fixed gaze.  Dr. Brantley Stage trauma, and Dr. Cheral Marker on the unit notified.  Dr. Cheral Marker bedside to assess.  Code stoke initiated, fluid bolus given and ativan before pt transported to STAT CT.  CBG 239 prior to transport.

## 2017-02-11 NOTE — Progress Notes (Signed)
Dr. Redmond Pulling notified of pt's low bp. Pt returned from CT SBP 50s. Saline bolus started, fent and precedex off, and MD notified. SBP up to 70s. Neo and unit of blood ordered. Family at bedside updated. MD on way to see pt.

## 2017-02-11 NOTE — Progress Notes (Signed)
Bedside EEG completed, results pending. 

## 2017-02-11 NOTE — Progress Notes (Addendum)
Patient ID: Peter [name], male   DOB: [birth year], 56 y.o.   MRN: [id number] Follow up - Trauma Critical Care  Patient Details:    Peter [name] is an 56 y.o. male.  Lines/tubes : Airway 8 mm (Active)  Secured at (cm) 24 cm 02/11/2017  7:52 AM  Measured From Lips 02/11/2017  7:52 AM  Secured Location Right 02/11/2017  7:52 AM  Secured By [employer] 02/11/2017  7:52 AM  Tube Holder Repositioned Yes 02/11/2017  7:52 AM  Cuff Pressure (cm H2O) 25 cm H2O 02/11/2017  4:32 AM  Site Condition Dry 02/11/2017  7:52 AM     CVC Triple Lumen 02/07/17 Left External jugular 16 cm 0 cm (Active)  Indication for Insertion or Continuance of Line Vasoactive infusions 02/10/2017  8:00 PM  Exposed Catheter (cm) 0 cm 02/07/2017  8:50 AM  Site Assessment Clean;Dry;Intact 02/10/2017  8:00 PM  Proximal Lumen Status Infusing 02/10/2017  8:00 PM  Medial Lumen Status Infusing 02/10/2017  8:00 PM  Distal Lumen Status Infusing 02/10/2017  8:00 PM  Dressing Type Transparent;Occlusive 02/10/2017  8:00 PM  Dressing Status Clean;Dry;Intact;Antimicrobial disc in place 02/10/2017  8:00 PM  Line Care Connections checked and tightened 02/10/2017  8:00 PM  Dressing Intervention Dressing changed;Antimicrobial disc changed 02/11/2017  5:00 AM  Dressing Change Due 02/18/17 02/11/2017  5:00 AM     Arterial Line 02/07/17 Right Radial (Active)  Site Assessment Clean;Dry;Intact 02/10/2017  8:00 PM  Line Status Pulsatile blood flow 02/10/2017  8:00 PM  Art Line Waveform Appropriate 02/10/2017  8:00 PM  Art Line Interventions Zeroed and calibrated 02/10/2017  8:00 PM  Color/Movement/Sensation Capillary refill less than 3 sec 02/10/2017  8:00 PM  Dressing Type Transparent;Occlusive 02/10/2017  8:00 PM  Dressing Status Clean;Dry;Intact 02/10/2017  8:00 PM     Chest Tube 1 Left Pleural 14 Fr. (Active)  Suction -20 cm H2O 02/10/2017  8:00 PM  Chest Tube Air Leak None 02/10/2017  8:00 PM  Patency Intervention Tip/tilt 02/08/2017  8:00  PM  Drainage Description Bright red 02/10/2017  8:00 PM  Dressing Status Clean;Dry;Intact 02/10/2017  8:00 PM  Dressing Intervention Dressing changed 02/09/2017  8:00 AM  Site Assessment Clean;Dry;Intact 02/10/2017  8:00 PM  Surrounding Skin Intact 02/10/2017  8:00 PM  Output (mL) 150 mL 02/11/2017  4:00 AM     NG/OG Tube Orogastric 16 Fr. Right mouth Xray (Active)  Site Assessment Clean;Dry;Intact 02/10/2017  8:00 PM  Ongoing Placement Verification No change in cm markings or external length of tube from initial placement;No change in respiratory status;No acute changes, not attributed to clinical condition 02/10/2017  8:00 PM  Status Infusing tube feed 02/10/2017  8:00 PM  Drainage Appearance Green 02/08/2017 10:00 AM  Intake (mL) 60 mL 02/08/2017  6:38 PM     Urethral Catheter [name], EMT Double-lumen 16 Fr. (Active)  Indication for Insertion or Continuance of Catheter Unstable spinal/crush injuries 02/10/2017  8:00 PM  Site Assessment Clean;Intact 02/10/2017  8:00 PM  Catheter Maintenance Bag below level of bladder;Catheter secured;Drainage bag/tubing not touching floor;Insertion date on drainage bag;No dependent loops;Seal intact 02/10/2017  8:00 PM  Collection Container Standard drainage bag 02/10/2017  8:00 PM  Securement Method Securing device (Describe) 02/10/2017  8:00 PM  Urinary Catheter Interventions Unclamped 02/10/2017  8:00 PM  Output (mL) 225 mL 02/11/2017  4:00 AM    Microbiology/Sepsis markers: Results for orders placed or performed during the hospital encounter of 02/07/17  MRSA PCR Screening  Status: None   Collection Time: 02/08/17  4:08 AM  Result Value Ref Range Status   MRSA by PCR NEGATIVE NEGATIVE Final    Comment:        The GeneXpert MRSA Assay (FDA approved for NASAL specimens only), is one component of a comprehensive MRSA colonization surveillance program. It is not intended to diagnose MRSA infection nor to guide or monitor treatment for MRSA infections.    Culture, respiratory (NON-Expectorated)     Status: None (Preliminary result)   Collection Time: 02/10/17 10:59 AM  Result Value Ref Range Status   Specimen Description TRACHEAL ASPIRATE  Final   Special Requests Normal  Final   Gram Stain   Final    ABUNDANT WBC PRESENT, PREDOMINANTLY PMN RARE SQUAMOUS EPITHELIAL CELLS PRESENT ABUNDANT GRAM NEGATIVE COCCI IN PAIRS ABUNDANT GRAM NEGATIVE RODS MODERATE GRAM POSITIVE COCCI IN CHAINS    Culture PENDING  Incomplete   Report Status PENDING  Incomplete    Anti-infectives:  Anti-infectives    Start     Dose/Rate Route Frequency Ordered Stop   02/07/17 2000  ceFAZolin (ANCEF) IVPB 1 g/50 mL premix     1 g 100 mL/hr over 30 Minutes Intravenous Every 8 hours 02/07/17 1416 02/08/17 1543      Best Practice/Protocols:  VTE Prophylaxis: Mechanical Sepsis (date>>>8/25) Intermittent Sedation  Consults: Treatment Team:  Altamese [place], MD    Studies:    Events: Pt had AMS last evening; stopped following commands, "eyes rolled back", some nystagmus. Code stroke initiated. CT head negative. CT Head angio unremarkable. Code stroke cancelled this am; febrile last evening Subjective:    Overnight Issues:  Around shift change pt became acute hypotensive. Systolic 20U. Sedation stopped and bolus initiated but pt remained hypotensive.  Objective:  Vital signs for last 24 hours: Temp:  [98.1 F (36.7 C)-103.1 F (39.5 C)] 98.4 F (36.9 C) (08/25 0815) Pulse Rate:  [41-135] 70 (08/25 0815) Resp:  [0-30] 0 (08/25 0815) BP: (55-149)/(44-103) 62/49 (08/25 0815) SpO2:  [92 %-100 %] 98 % (08/25 0815) Arterial Line BP: (56-152)/(38-81) 64/43 (08/25 0815) FiO2 (%):  [40 %] 40 % (08/25 0752)  Hemodynamic parameters for last 24 hours:    Intake/Output from previous day: 08/24 0701 - 08/25 0700 In: 2858.5 [I.V.:2246; NG/GT:612.5] Out: 1330 [Urine:1020; Chest Tube:310]  Intake/Output this shift: No intake/output data recorded.  Vent  settings for last 24 hours: Vent Mode: PRVC FiO2 (%):  [40 %] 40 % Set Rate:  [20 bmp] 20 bmp Vt Set:  [580 mL] 580 mL PEEP:  [8 cmH20] 8 cmH20 Plateau Pressure:  [14 [id number] cmH20] 28 cmH20  Physical Exam:  Intubated, no sedated, no follow commands PERRL Coarse BS b/l; symmetric chest rise. L CT - suction, no air leak Reg Some distension, a little firm, +BS, not rigid Scrotal edema Pelvic ext fix +SCDs, cool LE, no LE edema  Results for orders placed or performed during the hospital encounter of 02/07/17 (from the past 24 hour(s))  I-STAT 3, arterial blood gas (G3+)     Status: None   Collection Time: 02/10/17 10:09 AM  Result Value Ref Range   pH, Arterial 7.425 7.350 - 7.450   pCO2 arterial 35.6 32.0 - 48.0 mmHg   pO2, Arterial 98.0 83.0 - 108.0 mmHg   Bicarbonate 23.1 20.0 - 28.0 mmol/L   TCO2 24 22 - 32 mmol/L   O2 Saturation 97.0 %   Acid-base deficit 1.0 0.0 - 2.0 mmol/L   Patient temperature 100.9 F  Collection site RADIAL, ALLEN'S TEST ACCEPTABLE    Drawn by Operator    Sample type ARTERIAL   Culture, respiratory (NON-Expectorated)     Status: None (Preliminary result)   Collection Time: 02/10/17 10:59 AM  Result Value Ref Range   Specimen Description TRACHEAL ASPIRATE    Special Requests Normal    Gram Stain      ABUNDANT WBC PRESENT, PREDOMINANTLY PMN RARE SQUAMOUS EPITHELIAL CELLS PRESENT ABUNDANT GRAM NEGATIVE COCCI IN PAIRS ABUNDANT GRAM NEGATIVE RODS MODERATE GRAM POSITIVE COCCI IN CHAINS    Culture PENDING    Report Status PENDING   Glucose, capillary     Status: Abnormal   Collection Time: 02/10/17 11:44 AM  Result Value Ref Range   Glucose-Capillary 192 (H) 65 - 99 mg/dL  Glucose, capillary     Status: Abnormal   Collection Time: 02/10/17  3:46 PM  Result Value Ref Range   Glucose-Capillary 172 (H) 65 - 99 mg/dL  Procalcitonin - Baseline     Status: None   Collection Time: 02/10/17  6:20 PM  Result Value Ref Range   Procalcitonin 9.00  ng/mL  Glucose, capillary     Status: Abnormal   Collection Time: 02/10/17  8:08 PM  Result Value Ref Range   Glucose-Capillary 191 (H) 65 - 99 mg/dL  Glucose, capillary     Status: Abnormal   Collection Time: 02/10/17 11:26 PM  Result Value Ref Range   Glucose-Capillary 165 (H) 65 - 99 mg/dL  Glucose, capillary     Status: Abnormal   Collection Time: 02/11/17  3:28 AM  Result Value Ref Range   Glucose-Capillary 166 (H) 65 - 99 mg/dL  Blood gas, arterial     Status: Abnormal   Collection Time: 02/11/17  4:30 AM  Result Value Ref Range   FIO2 40.00    Delivery systems VENTILATOR    Mode PRESSURE REGULATED VOLUME CONTROL    VT 580 mL   LHR 20 resp/min   Peep/cpap 8.0 cm H20   pH, Arterial 7.324 (L) 7.350 - 7.450   pCO2 arterial 36.4 32.0 - 48.0 mmHg   pO2, Arterial 87.9 83.0 - 108.0 mmHg   Bicarbonate 18.4 (L) 20.0 - 28.0 mmol/L   Acid-base deficit 6.6 (H) 0.0 - 2.0 mmol/L   O2 Saturation 96.4 %   Patient temperature 98.4    Collection site ARTERIAL LINE    Drawn by [id number]    Sample type ARTERIAL    Allens test (pass/fail) PASS PASS  Triglycerides     Status: Abnormal   Collection Time: 02/11/17  5:20 AM  Result Value Ref Range   Triglycerides 267 (H) <150 mg/dL  Comprehensive metabolic panel     Status: Abnormal   Collection Time: 02/11/17  5:20 AM  Result Value Ref Range   Sodium 144 135 - 145 mmol/L   Potassium 4.7 3.5 - 5.1 mmol/L   Chloride 118 (H) 101 - 111 mmol/L   CO2 19 (L) 22 - 32 mmol/L   Glucose, Bld 201 (H) 65 - 99 mg/dL   BUN 37 (H) 6 - 20 mg/dL   Creatinine, Ser 1.45 (H) 0.61 - 1.24 mg/dL   Calcium 7.3 (L) 8.9 - 10.3 mg/dL   Total Protein 4.9 (L) 6.5 - 8.1 g/dL   Albumin 2.1 (L) 3.5 - 5.0 g/dL   AST 61 (H) 15 - 41 U/L   ALT 45 17 - 63 U/L   Alkaline Phosphatase 190 (H) 38 - 126 U/L   Total Bilirubin  3.8 (H) 0.3 - 1.2 mg/dL   GFR calc non Af Amer 52 (L) >60 mL/min   GFR calc Af Amer >60 >60 mL/min   Anion gap 7 5 - 15  CBC with  Differential/Platelet     Status: Abnormal   Collection Time: 02/11/17  5:20 AM  Result Value Ref Range   WBC 5.1 4.0 - 10.5 K/uL   RBC 2.91 (L) 4.22 - 5.81 MIL/uL   Hemoglobin 8.4 (L) 13.0 - 17.0 g/dL   HCT 25.7 (L) 39.0 - 52.0 %   MCV 88.3 78.0 - 100.0 fL   MCH 28.9 26.0 - 34.0 pg   MCHC 32.7 30.0 - 36.0 g/dL   RDW 15.1 11.5 - 15.5 %   Platelets 82 (L) 150 - 400 K/uL   Neutrophils Relative % 90 %   Neutro Abs 4.6 1.7 - 7.7 K/uL   Lymphocytes Relative 5 %   Lymphs Abs 0.3 (L) 0.7 - 4.0 K/uL   Monocytes Relative 5 %   Monocytes Absolute 0.2 0.1 - 1.0 K/uL   Eosinophils Relative 1 %   Eosinophils Absolute 0.0 0.0 - 0.7 K/uL   Basophils Relative 0 %   Basophils Absolute 0.0 0.0 - 0.1 K/uL  Procalcitonin     Status: None   Collection Time: 02/11/17  5:20 AM  Result Value Ref Range   Procalcitonin 13.25 ng/mL  Magnesium     Status: Abnormal   Collection Time: 02/11/17  5:20 AM  Result Value Ref Range   Magnesium 1.6 (L) 1.7 - 2.4 mg/dL  Phosphorus     Status: Abnormal   Collection Time: 02/11/17  5:20 AM  Result Value Ref Range   Phosphorus 2.4 (L) 2.5 - 4.6 mg/dL    Assessment & Plan: Present on Admission: . Multiple fractures of ribs, bilateral, initial encounter for closed fracture . Multiple closed anterior-posterior compression fractures of pelvis (HCC)  PHB chicken semi L 2-9 and R 3-8 rib FX with occult R PTX and L HPTX - L CT decreased to 20 suction. No sign of PTX on cxr this am to explain hypoTN.  Cont suction today. Repeat CXR in am Grade 3 spleen lac with small extrav - S/P Angioembolization by Dr. Annamaria Boots 8/21 shock - recurrent 8/25 am,  S/P L internal iliac artery angioembolization by Dr. Annamaria Boots 8/21 and splenic angioembolization 8/21; hemorrhagic vs sepsis? procalcitonin elevated and febrile overnight. Will start empiric antibx. plt count down and hgb down slightly. Give 1 unit blood emergently; repeat type and screen since bld sample expired. Check stat coags and  fibrinogen since haven't been checked in a few days and pt has gotten product. Pt has gotten 2L bolus of NS this am with no improvement in hypotension, will start Neo gtt.  ABL anemia - transfuse 1u emergently now for acute hypotension; repeat coags/fibrinogen levels.  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 Marcelino Scot, for symphysis plating by Dr. Marcelino Scot in near future Vent dependent resp failure - cont vent support, prob not ready for wean given shock; defer to CCM FEN - hold TF until etiology of shock identified. Hypomagnesia - replace Mag, cont mIVF IDDM - lantus 10u QHS, ICU glycemic control protocol ID- febrile, hypotension, elevated procalcitonin, sepsis?; start empiric vanc/zosyn, check ua, blood cultures, lactate. Monitor renal function.  Renal- AKI Cr 1.45 today, uop ok. Follow daily. May  need to renal dose abx at some point - defer to pharm VTE - PAS, no lovenox until Hb stable Elevated LFTs - tbili is increasing. 3.8 today, AP slightly elevated as well. Shock? Follow trend for now.  Dispo - ICU, I spoke with his daughters and wife at the bedside Critical Care Total Time*: 45 Minutes  LOS: 4 days   Additional comments:I reviewed the patient's new clinical lab test results. I reviewed the patients new imaging test results.  and I discussed the patient's change with Dr. Nelda Marseille and stroke team.    Leighton Ruff. Redmond Pulling, MD, FACS General, Bariatric, & Minimally Invasive Surgery [facility], [place]   02/11/2017  *Care during the described time interval was provided by me. I have reviewed this patient's available data, including medical history, events of note, physical examination and test results as part of my evaluation.

## 2017-02-11 NOTE — Consult Note (Signed)
Referring Physician: Dr. Grandville Silos    Chief Complaint: Acute AMS with leftward gaze deviation and upbeating nystagmus  HPI: Peter [name] is an 56 y.o. male who initially presented as a level 1 trauma with multiple traumatic injuries including bilateral rib fractures, left HPTX, spleen laceration and complex open book pelvic fracture with associated hemorrhagic shock. He was stable until 7:30 AM this morning when he suddenly exhibited decreased responsiveness with leftward gaze deviation. Code Stroke was called.   LSN: 7:30 AM tPA Given: No: Recent major trauma including spleen laceration  Past Medical History:  Diagnosis Date  . Diabetes mellitus without complication ([place])   . High cholesterol   . Multiple closed anterior-posterior compression fractures of pelvis ([place]) 02/08/2017    Past Surgical History:  Procedure Laterality Date  . EXTERNAL FIXATION PELVIS N/A 02/07/2017   Procedure: EXTERNAL FIXATION PELVIS;  Surgeon: Altamese Brazos, MD;  Location: [facility];  Service: Orthopedics;  Laterality: N/A;  . HERNIA REPAIR    . IR ANGIOGRAM PELVIS SELECTIVE OR SUPRASELECTIVE  02/07/2017  . IR ANGIOGRAM SELECTIVE EACH ADDITIONAL VESSEL  02/07/2017  . IR ANGIOGRAM SELECTIVE EACH ADDITIONAL VESSEL  02/07/2017  . IR ANGIOGRAM VISCERAL SELECTIVE  02/07/2017  . IR ANGIOGRAM VISCERAL SELECTIVE  02/07/2017  . IR EMBO ART  VEN HEMORR LYMPH EXTRAV  INC GUIDE ROADMAPPING  02/07/2017  . IR EMBO ART  VEN HEMORR LYMPH EXTRAV  INC GUIDE ROADMAPPING  02/07/2017  . IR US GUIDE VASC ACCESS RIGHT  02/07/2017  . RADIOLOGY WITH ANESTHESIA N/A 02/07/2017   Procedure: RADIOLOGY WITH ANESTHESIA;  Surgeon: Greggory Keen, MD;  Location: [facility];  Service: Radiology;  Laterality: N/A;  . SACROILIAC JOINT FUSION N/A 02/07/2017   Procedure: SACROILIAC JOINT FUSION;  Surgeon: Altamese Petros, MD;  Location: [facility];  Service: Orthopedics;  Laterality: N/A;    History reviewed. No pertinent family history. Social History:  reports  that he has never smoked. He does not have any smokeless tobacco history on file. He reports that he does not drink alcohol or use drugs.  Allergies: No Known Allergies  Medications:  Scheduled: . chlorhexidine gluconate (MEDLINE KIT)  15 mL Mouth Rinse BID  . Chlorhexidine Gluconate Cloth  6 each Topical Daily  . docusate  100 mg Per Tube BID  . insulin aspart  0-20 Units Subcutaneous Q4H  . insulin glargine  10 Units Subcutaneous QHS  . iopamidol      . mouth rinse  15 mL Mouth Rinse Q2H  . metoprolol tartrate  25 mg Per Tube BID  . pantoprazole  40 mg Oral Daily   Or  . pantoprazole (PROTONIX) IV  40 mg Intravenous Daily  . sodium chloride flush  10-40 mL Intracatheter Q12H   Continuous: . sodium chloride 50 mL/hr at 02/11/17 0400  . 0.9 % NaCl with KCl 20 mEq / L 40 mL/hr at 02/11/17 0400  . dexmedetomidine (PRECEDEX) IV infusion 0.7 mcg/kg/hr (02/11/17 0423)  . feeding supplement (PIVOT 1.5 CAL) 1,000 mL (02/11/17 0400)  . fentaNYL infusion INTRAVENOUS 250 mcg/hr (02/11/17 0400)  . insulin (NOVOLIN-R) infusion Stopped (02/08/17 0025)  . levETIRAcetam    . levETIRAcetam    . propofol (DIPRIVAN) infusion Stopped (02/10/17 1202)    ROS: Unable to obtain due to AMS and intubation/sedation  Physical Examination: Blood pressure (!) 68/57, pulse 76, temperature 98.2 F (36.8 C), resp. rate 20, height _0  (1.778 m), weight 75 kg (165 lb 5.5 oz), SpO2 100 %.  HEENT: [place]/AT Lungs: Intubated  Ext: Diffuse edema of upper and lower extremities  Neurologic Examination: Mental Status: Confused with decreased reactivity to external stimuli. Opens eyes spontaneously and stares straight forward but does not appear to be fixating. Overbreathing the vent. Mentation improved slightly during exam with patient eventually able to twitch fingers of right hand and grimace to command Cranial Nerves:  II:  Does not blink to threat on left or right. Pupils equal and sluggishly reactive    III,IV, VI: Leftward eye deviation. Unable to cross midline to right to command or with noxious. Upbeating nystagmus is intermittently seen bilaterally V,VII: Weak grimace is symmetric. Decreased responsiveness to tactile stimulation on left and right. VIII: hearing intact to voice IX,X: Intubated XI: Unable to assess XII: Intubated Motor: RUE: Briefly lifts antigravity. Briefly weakly flexes digits of right hand to command.  LUE: No movement to noxious RLE and LLE: Flexion/extension of both feet intermittently and spontaneously, appearing most consistent with agitation. Does not flex at knee or at hip to command or noxious Sensory: No response to noxious stimuli LUE. No withdrawal of lower extremities to plantar stimulation Deep Tendon Reflexes:  Hypoactive reflexes all 4 extremities. Toes mute bilaterally Cerebellar: Does not lift arms for testing.   Gait: Unable to assess  Results for orders placed or performed during the hospital encounter of 02/07/17 (from the past 48 hour(s))  Glucose, capillary     Status: Abnormal   Collection Time: 02/09/17  9:15 AM  Result Value Ref Range   Glucose-Capillary 184 (H) 65 - 99 mg/dL  Glucose, capillary     Status: Abnormal   Collection Time: 02/09/17 11:56 AM  Result Value Ref Range   Glucose-Capillary 181 (H) 65 - 99 mg/dL  Glucose, capillary     Status: Abnormal   Collection Time: 02/09/17  3:45 PM  Result Value Ref Range   Glucose-Capillary 179 (H) 65 - 99 mg/dL  Glucose, capillary     Status: Abnormal   Collection Time: 02/09/17  8:04 PM  Result Value Ref Range   Glucose-Capillary 149 (H) 65 - 99 mg/dL  Glucose, capillary     Status: Abnormal   Collection Time: 02/09/17 11:55 PM  Result Value Ref Range   Glucose-Capillary 151 (H) 65 - 99 mg/dL   Comment 1 Notify RN    Comment 2 Document in Chart   Glucose, capillary     Status: Abnormal   Collection Time: 02/10/17  3:42 AM  Result Value Ref Range   Glucose-Capillary 113 (H) 65 -  99 mg/dL   Comment 1 Notify RN    Comment 2 Document in Chart   CBC     Status: Abnormal   Collection Time: 02/10/17  4:41 AM  Result Value Ref Range   WBC 5.4 4.0 - 10.5 K/uL   RBC 3.32 (L) 4.22 - 5.81 MIL/uL   Hemoglobin 9.3 (L) 13.0 - 17.0 g/dL   HCT 29.1 (L) 39.0 - 52.0 %   MCV 87.7 78.0 - 100.0 fL   MCH 28.0 26.0 - 34.0 pg   MCHC 32.0 30.0 - 36.0 g/dL   RDW 15.0 11.5 - 15.5 %   Platelets 97 (L) 150 - 400 K/uL    Comment: CONSISTENT WITH PREVIOUS RESULT  Basic metabolic panel     Status: Abnormal   Collection Time: 02/10/17  4:41 AM  Result Value Ref Range   Sodium 144 135 - 145 mmol/L   Potassium 4.5 3.5 - 5.1 mmol/L   Chloride 114 (H) 101 - 111 mmol/L  CO2 21 (L) 22 - 32 mmol/L   Glucose, Bld 92 65 - 99 mg/dL   BUN 22 (H) 6 - 20 mg/dL   Creatinine, Ser 1.31 (H) 0.61 - 1.24 mg/dL   Calcium 7.5 (L) 8.9 - 10.3 mg/dL   GFR calc non Af Amer 59 (L) >60 mL/min   GFR calc Af Amer >60 >60 mL/min    Comment: (NOTE) The eGFR has been calculated using the CKD EPI equation. This calculation has not been validated in all clinical situations. eGFR's persistently <60 mL/min signify possible Chronic Kidney Disease.    Anion gap 9 5 - 15  Glucose, capillary     Status: Abnormal   Collection Time: 02/10/17  7:25 AM  Result Value Ref Range   Glucose-Capillary 117 (H) 65 - 99 mg/dL  Triglycerides     Status: Abnormal   Collection Time: 02/10/17  8:18 AM  Result Value Ref Range   Triglycerides 341 (H) <150 mg/dL  I-STAT 3, arterial blood gas (G3+)     Status: None   Collection Time: 02/10/17 10:09 AM  Result Value Ref Range   pH, Arterial 7.425 7.350 - 7.450   pCO2 arterial 35.6 32.0 - 48.0 mmHg   pO2, Arterial 98.0 83.0 - 108.0 mmHg   Bicarbonate 23.1 20.0 - 28.0 mmol/L   TCO2 24 22 - 32 mmol/L   O2 Saturation 97.0 %   Acid-base deficit 1.0 0.0 - 2.0 mmol/L   Patient temperature 100.9 F    Collection site RADIAL, ALLEN'S TEST ACCEPTABLE    Drawn by Operator    Sample type  ARTERIAL   Culture, respiratory (NON-Expectorated)     Status: None (Preliminary result)   Collection Time: 02/10/17 10:59 AM  Result Value Ref Range   Specimen Description TRACHEAL ASPIRATE    Special Requests Normal    Gram Stain      ABUNDANT WBC PRESENT, PREDOMINANTLY PMN RARE SQUAMOUS EPITHELIAL CELLS PRESENT ABUNDANT GRAM NEGATIVE COCCI IN PAIRS ABUNDANT GRAM NEGATIVE RODS MODERATE GRAM POSITIVE COCCI IN CHAINS    Culture PENDING    Report Status PENDING   Glucose, capillary     Status: Abnormal   Collection Time: 02/10/17 11:44 AM  Result Value Ref Range   Glucose-Capillary 192 (H) 65 - 99 mg/dL  Glucose, capillary     Status: Abnormal   Collection Time: 02/10/17  3:46 PM  Result Value Ref Range   Glucose-Capillary 172 (H) 65 - 99 mg/dL  Procalcitonin - Baseline     Status: None   Collection Time: 02/10/17  6:20 PM  Result Value Ref Range   Procalcitonin 9.00 ng/mL    Comment:        Interpretation: PCT > 2 ng/mL: Systemic infection (sepsis) is likely, unless other causes are known. (NOTE)         ICU PCT Algorithm               Non ICU PCT Algorithm    ----------------------------     ------------------------------         PCT < 0.25 ng/mL                 PCT < 0.1 ng/mL     Stopping of antibiotics            Stopping of antibiotics       strongly encouraged.               strongly encouraged.    ----------------------------     ------------------------------  PCT level decrease by               PCT < 0.25 ng/mL       >= 80% from peak PCT       OR PCT 0.25 - 0.5 ng/mL          Stopping of antibiotics                                             encouraged.     Stopping of antibiotics           encouraged.    ----------------------------     ------------------------------       PCT level decrease by              PCT >= 0.25 ng/mL       < 80% from peak PCT        AND PCT >= 0.5 ng/mL            Continuing antibiotics                                                encouraged.       Continuing antibiotics            encouraged.    ----------------------------     ------------------------------     PCT level increase compared          PCT > 0.5 ng/mL         with peak PCT AND          PCT >= 0.5 ng/mL             Escalation of antibiotics                                          strongly encouraged.      Escalation of antibiotics        strongly encouraged.   Glucose, capillary     Status: Abnormal   Collection Time: 02/10/17  8:08 PM  Result Value Ref Range   Glucose-Capillary 191 (H) 65 - 99 mg/dL  Glucose, capillary     Status: Abnormal   Collection Time: 02/10/17 11:26 PM  Result Value Ref Range   Glucose-Capillary 165 (H) 65 - 99 mg/dL  Glucose, capillary     Status: Abnormal   Collection Time: 02/11/17  3:28 AM  Result Value Ref Range   Glucose-Capillary 166 (H) 65 - 99 mg/dL  Blood gas, arterial     Status: Abnormal   Collection Time: 02/11/17  4:30 AM  Result Value Ref Range   FIO2 40.00    Delivery systems VENTILATOR    Mode PRESSURE REGULATED VOLUME CONTROL    VT 580 mL   LHR 20 resp/min   Peep/cpap 8.0 cm H20   pH, Arterial 7.324 (L) 7.350 - 7.450   pCO2 arterial 36.4 32.0 - 48.0 mmHg   pO2, Arterial 87.9 83.0 - 108.0 mmHg   Bicarbonate 18.4 (L) 20.0 - 28.0 mmol/L   Acid-base deficit 6.6 (H) 0.0 - 2.0 mmol/L   O2 Saturation 96.4 %   Patient temperature 98.4  Collection site ARTERIAL LINE    Drawn by [phone]    Sample type ARTERIAL    Allens test (pass/fail) PASS PASS  Triglycerides     Status: Abnormal   Collection Time: 02/11/17  5:20 AM  Result Value Ref Range   Triglycerides 267 (H) <150 mg/dL  Comprehensive metabolic panel     Status: Abnormal   Collection Time: 02/11/17  5:20 AM  Result Value Ref Range   Sodium 144 135 - 145 mmol/L   Potassium 4.7 3.5 - 5.1 mmol/L   Chloride 118 (H) 101 - 111 mmol/L   CO2 19 (L) 22 - 32 mmol/L   Glucose, Bld 201 (H) 65 - 99 mg/dL   BUN 37 (H) 6 - 20 mg/dL    Creatinine, Ser 1.45 (H) 0.61 - 1.24 mg/dL   Calcium 7.3 (L) 8.9 - 10.3 mg/dL   Total Protein 4.9 (L) 6.5 - 8.1 g/dL   Albumin 2.1 (L) 3.5 - 5.0 g/dL   AST 61 (H) 15 - 41 U/L   ALT 45 17 - 63 U/L   Alkaline Phosphatase 190 (H) 38 - 126 U/L   Total Bilirubin 3.8 (H) 0.3 - 1.2 mg/dL   GFR calc non Af Amer 52 (L) >60 mL/min   GFR calc Af Amer >60 >60 mL/min    Comment: (NOTE) The eGFR has been calculated using the CKD EPI equation. This calculation has not been validated in all clinical situations. eGFR's persistently <60 mL/min signify possible Chronic Kidney Disease.    Anion gap 7 5 - 15  CBC with Differential/Platelet     Status: Abnormal   Collection Time: 02/11/17  5:20 AM  Result Value Ref Range   WBC 5.1 4.0 - 10.5 K/uL   RBC 2.91 (L) 4.22 - 5.81 MIL/uL   Hemoglobin 8.4 (L) 13.0 - 17.0 g/dL   HCT 25.7 (L) 39.0 - 52.0 %   MCV 88.3 78.0 - 100.0 fL   MCH 28.9 26.0 - 34.0 pg   MCHC 32.7 30.0 - 36.0 g/dL   RDW 15.1 11.5 - 15.5 %   Platelets 82 (L) 150 - 400 K/uL    Comment: CONSISTENT WITH PREVIOUS RESULT   Neutrophils Relative % 90 %   Neutro Abs 4.6 1.7 - 7.7 K/uL   Lymphocytes Relative 5 %   Lymphs Abs 0.3 (L) 0.7 - 4.0 K/uL   Monocytes Relative 5 %   Monocytes Absolute 0.2 0.1 - 1.0 K/uL   Eosinophils Relative 1 %   Eosinophils Absolute 0.0 0.0 - 0.7 K/uL   Basophils Relative 0 %   Basophils Absolute 0.0 0.0 - 0.1 K/uL  Magnesium     Status: Abnormal   Collection Time: 02/11/17  5:20 AM  Result Value Ref Range   Magnesium 1.6 (L) 1.7 - 2.4 mg/dL  Phosphorus     Status: Abnormal   Collection Time: 02/11/17  5:20 AM  Result Value Ref Range   Phosphorus 2.4 (L) 2.5 - 4.6 mg/dL   Dg Chest Port 1 View  Result Date: 02/10/2017 CLINICAL DATA:  Chest tube . EXAM: PORTABLE CHEST 1 VIEW COMPARISON:  02/10/2017 . FINDINGS: Endotracheal tube, NG tube, left subclavian line, left chest tube in stable position. No pneumothorax. Stable left chest wall subcutaneous emphysema.  Left rib fractures are again noted. Mild basilar atelectasis. Heart size stable . IMPRESSION: 1. Lines and tubes including left chest tube in stable position. No pneumothorax. Left chest wall subcutaneous emphysema again noted. Multiple left rib fractures again noted .  2. Basilar atelectasis.  Chest is unchanged from prior exam. Electronically Signed   By: Marcello Moores  Register   On: 02/10/2017 13:02   Dg Chest Port 1 View  Result Date: 02/10/2017 CLINICAL DATA:  Hypoxia EXAM: PORTABLE CHEST 1 VIEW COMPARISON:  February 08, 2017 FINDINGS: Endotracheal tube tip is 6.5 cm above the carina. Central catheter tip is in the superior vena cava. Nasogastric tube tip and side port below the diaphragm. Chest catheter is noted on the left, unchanged in position. No evident pneumothorax. There is subcutaneous air on the left. There is a small left pleural effusion with patchy airspace opacity in the left lower lobe. Right lung is clear. Heart size and pulmonary vascularity are within normal limits. No adenopathy. Prior rib fractures on the left. IMPRESSION: No evident pneumothorax. Subcutaneous air is again noted on the left side, somewhat less than on recent prior study. There is a small left pleural effusion with atelectatic change in the left base. There may be superimposed contusion and/ or pneumonia in this area. Right lung is clear. Stable cardiac silhouette. Electronically Signed   By: Lowella Grip III M.D.   On: 02/10/2017 07:11    Assessment: 56 y.o. male initially presenting as a level 1 trauma, now with acute onset of AMS with leftward eye deviation, upbeating nystagmus and decreased movement of all 4 extremties 1. DDx for acute presentation includes seizure, acute delirium, acute ischemic stroke and acute ICH 2. Multiple traumatic injuries including bilateral rib fractures, left HPTX, spleen laceration and complex open book pelvic fracture with associated hemorrhagic shock.  3. Stroke Risk Factors - DM,  hypercholesterolemia and recent major trauma with multiple fractures (hypercoagulability and risk for fat embolism) 4. On SCDs only for DVT prophylaxis  Plan: 1. Code Stroke has been called 2. STAT CT head 3. STAT CTA of head and neck if no hemorrhage on CT head 4. IV Keppra 1000 mg x 1 followed by scheduled dosing at 500 mg BID 5. STAT EEG 6. Frequent neuro checks 7. IV Ativan 2 mg x 1 8. Obtain ionized Ca level and correct as indicated 9. Correct hypomagnesemia and hypophosphatemia  75 minutes spent in the emergent Neurological evaluation and management of this critically ill patient  _0  signed: Dr. Kerney Elbe 02/11/2017, 6:51 AM
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
## 2017-02-11 NOTE — Progress Notes (Signed)
Pt experiencing low BP with cuff and A-line correlating well. Dr. Brantley Stage notified, order for NS Bolus received.

## 2017-02-11 NOTE — Progress Notes (Signed)
Pharmacy Antibiotic Note  Peter [name] is a 56 y.o. male admitted on 02/07/2017 with sepsis.   Plan: Zosyn 3.375 q8h Vanc 750 mg q12h Monitor renal fx, cx vt prn  Height: 5\' 10"  (177.8 cm) Weight: 165 lb 5.5 oz (75 kg) IBW/kg (Calculated) : 73  Temp (24hrs), Avg:99.7 F (37.6 C), Min:98.1 F (36.7 C), Max:103.1 F (39.5 C)   Recent Labs Lab 02/07/17 0834 02/07/17 0838 02/07/17 1019 02/07/17 1405  02/07/17 2030 02/08/17 0300 02/08/17 0302 02/09/17 0355 02/10/17 0441 02/11/17 0520  WBC  --   --   --   --   < > 5.5  --  8.6 8.7 5.4 5.1  CREATININE  --  1.30*  --   --   --   --   --  1.07 1.21 1.31* 1.45*  LATICACIDVEN 3.65*  --  5.0* 4.4*  --  2.8* 1.3  --   --   --   --   < > = values in this interval not displayed.  Estimated Creatinine Clearance: 58.7 mL/min (A) (by C-G formula based on SCr of 1.45 mg/dL (H)).    No Known Allergies  Levester Fresh, PharmD, BCPS, BCCCP Clinical Pharmacist Clinical phone for 02/11/2017 from 7a-3:30p: [phone] If after 3:30p, please call main pharmacy at: [phone] 02/11/2017 8:59 AM

## 2017-02-11 NOTE — Progress Notes (Signed)
Stat EEG held at this time per RN request due to pt condition, low BP's.
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
## 2017-02-12 ENCOUNTER — Inpatient Hospital Stay (HOSPITAL_COMMUNITY): Payer: BLUE CROSS/BLUE SHIELD

## 2017-02-12 DIAGNOSIS — R6521 Severe sepsis with septic shock: Secondary | ICD-10-CM

## 2017-02-12 DIAGNOSIS — A419 Sepsis, unspecified organism: Secondary | ICD-10-CM

## 2017-02-12 DIAGNOSIS — N170 Acute kidney failure with tubular necrosis: Secondary | ICD-10-CM

## 2017-02-12 LAB — PROCALCITONIN: Procalcitonin: 45.27 ng/mL

## 2017-02-12 LAB — PHOSPHORUS: PHOSPHORUS: 3.5 mg/dL (ref 2.5–4.6)

## 2017-02-12 LAB — CBC
HCT: 27.9 % — ABNORMAL LOW (ref 39.0–52.0)
Hemoglobin: 9.3 g/dL — ABNORMAL LOW (ref 13.0–17.0)
MCH: 29.1 pg (ref 26.0–34.0)
MCHC: 33.3 g/dL (ref 30.0–36.0)
MCV: 87.2 fL (ref 78.0–100.0)
Platelets: 91 10*3/uL — ABNORMAL LOW (ref 150–400)
RBC: 3.2 MIL/uL — ABNORMAL LOW (ref 4.22–5.81)
RDW: 15.4 % (ref 11.5–15.5)
WBC: 14.3 10*3/uL — ABNORMAL HIGH (ref 4.0–10.5)

## 2017-02-12 LAB — CULTURE, RESPIRATORY: CULTURE: NORMAL

## 2017-02-12 LAB — COMPREHENSIVE METABOLIC PANEL
ALT: 210 U/L — ABNORMAL HIGH (ref 17–63)
AST: 403 U/L — ABNORMAL HIGH (ref 15–41)
Albumin: 1.8 g/dL — ABNORMAL LOW (ref 3.5–5.0)
Alkaline Phosphatase: 226 U/L — ABNORMAL HIGH (ref 38–126)
Anion gap: 9 (ref 5–15)
BUN: 50 mg/dL — ABNORMAL HIGH (ref 6–20)
CO2: 17 mmol/L — ABNORMAL LOW (ref 22–32)
Calcium: 7.4 mg/dL — ABNORMAL LOW (ref 8.9–10.3)
Chloride: 120 mmol/L — ABNORMAL HIGH (ref 101–111)
Creatinine, Ser: 2.15 mg/dL — ABNORMAL HIGH (ref 0.61–1.24)
GFR calc Af Amer: 38 mL/min — ABNORMAL LOW (ref >60)
GFR calc non Af Amer: 33 mL/min — ABNORMAL LOW (ref >60)
Glucose, Bld: 126 mg/dL — ABNORMAL HIGH (ref 65–99)
Potassium: 4.9 mmol/L (ref 3.5–5.1)
Sodium: 146 mmol/L — ABNORMAL HIGH (ref 135–145)
Total Bilirubin: 10.6 mg/dL — ABNORMAL HIGH (ref 0.3–1.2)
Total Protein: 4.8 g/dL — ABNORMAL LOW (ref 6.5–8.1)

## 2017-02-12 LAB — GLUCOSE, CAPILLARY
GLUCOSE-CAPILLARY: 162 mg/dL — AB (ref 65–99)
GLUCOSE-CAPILLARY: 175 mg/dL — AB (ref 65–99)
GLUCOSE-CAPILLARY: 166 mg/dL — AB (ref 65–99)
Glucose-Capillary: 113 mg/dL — ABNORMAL HIGH (ref 65–99)
Glucose-Capillary: 116 mg/dL — ABNORMAL HIGH (ref 65–99)
Glucose-Capillary: 97 mg/dL (ref 65–99)

## 2017-02-12 LAB — CULTURE, RESPIRATORY W GRAM STAIN: Special Requests: NORMAL

## 2017-02-12 LAB — CREATININE, URINE, RANDOM: CREATININE, URINE: 118.47 mg/dL

## 2017-02-12 LAB — SODIUM, URINE, RANDOM: Sodium, Ur: 11 mmol/L

## 2017-02-12 LAB — MAGNESIUM: Magnesium: 1.8 mg/dL (ref 1.7–2.4)

## 2017-02-12 MED ORDER — PIVOT 1.5 CAL PO LIQD
1000.0000 mL | ORAL | Status: DC
  Filled 2017-02-12 (×3): qty 1000

## 2017-02-12 MED ORDER — SODIUM CHLORIDE 0.9 % IV BOLUS (SEPSIS)
500.0000 mL | Freq: Once | INTRAVENOUS | Status: AC
  Administered 2017-02-12: 500 mL via INTRAVENOUS

## 2017-02-12 MED ORDER — PIVOT 1.5 CAL PO LIQD
1000.0000 mL | ORAL | Status: DC
  Administered 2017-02-12: 1000 mL
  Filled 2017-02-12: qty 1000
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
## 2017-02-12 NOTE — Progress Notes (Signed)
This note also relates to the following rows which could not be included: SpO2 - Cannot attach notes to unvalidated device data  Pt placed back on full vent support due to RR in the 40's.
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
## 2017-02-13 ENCOUNTER — Inpatient Hospital Stay (HOSPITAL_COMMUNITY): Payer: BLUE CROSS/BLUE SHIELD

## 2017-02-13 ENCOUNTER — Encounter (HOSPITAL_COMMUNITY)
Admission: EM | Disposition: A | Discharge status: Nursing Home (Includes ICF) | Payer: Self-pay | Source: Home / Self Care

## 2017-02-13 ENCOUNTER — Encounter (HOSPITAL_COMMUNITY): Payer: Self-pay | Admitting: Certified Registered Nurse Anesthetist

## 2017-02-13 ENCOUNTER — Inpatient Hospital Stay (HOSPITAL_COMMUNITY): Payer: BLUE CROSS/BLUE SHIELD | Admitting: Certified Registered Nurse Anesthetist

## 2017-02-13 HISTORY — PX: ORIF PELVIC FRACTURE: SHX2128

## 2017-02-13 LAB — SURGICAL PCR SCREEN
MRSA, PCR: NEGATIVE
Staphylococcus aureus: NEGATIVE

## 2017-02-13 LAB — POCT I-STAT 4, (NA,K, GLUC, HGB,HCT)
Glucose, Bld: 102 mg/dL — ABNORMAL HIGH (ref 65–99)
Glucose, Bld: 54 mg/dL — ABNORMAL LOW (ref 65–99)
HCT: 20 % — ABNORMAL LOW (ref 39.0–52.0)
HCT: 24 % — ABNORMAL LOW (ref 39.0–52.0)
Hemoglobin: 6.8 g/dL — CL (ref 13.0–17.0)
Hemoglobin: 8.2 g/dL — ABNORMAL LOW (ref 13.0–17.0)
Potassium: 3.6 mmol/L (ref 3.5–5.1)
Potassium: 4.3 mmol/L (ref 3.5–5.1)
Sodium: 151 mmol/L — ABNORMAL HIGH (ref 135–145)
Sodium: 151 mmol/L — ABNORMAL HIGH (ref 135–145)

## 2017-02-13 LAB — BLOOD GAS, ARTERIAL
ACID-BASE DEFICIT: 7.1 mmol/L — AB (ref 0.0–2.0)
ACID-BASE EXCESS: 8.6 mmol/L — AB (ref 0.0–2.0)
BICARBONATE: 17.3 mmol/L — AB (ref 20.0–28.0)
Bicarbonate: 15.7 mmol/L — ABNORMAL LOW (ref 20.0–28.0)
Drawn by: 437071
FIO2: 0.4
FIO2: 40
LHR: 20 {breaths}/min
MECHVT: 580 mL
O2 SAT: 95.6 %
O2 SAT: 98.1 %
PATIENT TEMPERATURE: 100.9
PCO2 ART: 29.6 mmHg — AB (ref 32.0–48.0)
PEEP/CPAP: 8 cmH2O
PEEP/CPAP: 5 cmH2O
PH ART: 7.35 (ref 7.350–7.450)
PH ART: 7.364 (ref 7.350–7.450)
PO2 ART: 122 mmHg — AB (ref 83.0–108.0)
Patient temperature: 98.6
RATE: 20 resp/min
VT: 580 mL
pCO2 arterial: 31.1 mmHg — ABNORMAL LOW (ref 32.0–48.0)
pO2, Arterial: 85.7 mmHg (ref 83.0–108.0)

## 2017-02-13 LAB — CBC
HEMATOCRIT: 27.4 % — AB (ref 39.0–52.0)
HEMATOCRIT: 26.5 % — AB (ref 39.0–52.0)
HEMOGLOBIN: 8.8 g/dL — AB (ref 13.0–17.0)
Hemoglobin: 9.2 g/dL — ABNORMAL LOW (ref 13.0–17.0)
MCH: 29.5 pg (ref 26.0–34.0)
MCH: 29.2 pg (ref 26.0–34.0)
MCHC: 33.6 g/dL (ref 30.0–36.0)
MCHC: 33.2 g/dL (ref 30.0–36.0)
MCV: 87.8 fL (ref 78.0–100.0)
MCV: 88 fL (ref 78.0–100.0)
Platelets: 109 10*3/uL — ABNORMAL LOW (ref 150–400)
Platelets: 99 10*3/uL — ABNORMAL LOW (ref 150–400)
RBC: 3.12 MIL/uL — AB (ref 4.22–5.81)
RBC: 3.01 MIL/uL — AB (ref 4.22–5.81)
RDW: 16.1 % — AB (ref 11.5–15.5)
RDW: 16.1 % — ABNORMAL HIGH (ref 11.5–15.5)
WBC: 10.1 10*3/uL (ref 4.0–10.5)
WBC: 9 10*3/uL (ref 4.0–10.5)

## 2017-02-13 LAB — PREPARE RBC (CROSSMATCH)

## 2017-02-13 LAB — POCT I-STAT 7, (LYTES, BLD GAS, ICA,H+H)
Acid-base deficit: 8 mmol/L — ABNORMAL HIGH (ref 0.0–2.0)
Bicarbonate: 18.8 mmol/L — ABNORMAL LOW (ref 20.0–28.0)
Calcium, Ion: 1.17 mmol/L (ref 1.15–1.40)
HCT: 25 % — ABNORMAL LOW (ref 39.0–52.0)
Hemoglobin: 8.5 g/dL — ABNORMAL LOW (ref 13.0–17.0)
O2 Saturation: 93 %
Potassium: 4.2 mmol/L (ref 3.5–5.1)
Sodium: 151 mmol/L — ABNORMAL HIGH (ref 135–145)
TCO2: 20 mmol/L — ABNORMAL LOW (ref 22–32)
pCO2 arterial: 42.6 mmHg (ref 32.0–48.0)
pH, Arterial: 7.252 — ABNORMAL LOW (ref 7.350–7.450)
pO2, Arterial: 78 mmHg — ABNORMAL LOW (ref 83.0–108.0)

## 2017-02-13 LAB — BASIC METABOLIC PANEL
ANION GAP: 8 (ref 5–15)
BUN: 58 mg/dL — AB (ref 6–20)
CO2: 20 mmol/L — ABNORMAL LOW (ref 22–32)
Calcium: 7.8 mg/dL — ABNORMAL LOW (ref 8.9–10.3)
Chloride: 121 mmol/L — ABNORMAL HIGH (ref 101–111)
Creatinine, Ser: 1.29 mg/dL — ABNORMAL HIGH (ref 0.61–1.24)
GFR calc Af Amer: >60 mL/min (ref >60)
Glucose, Bld: 128 mg/dL — ABNORMAL HIGH (ref 65–99)
POTASSIUM: 4 mmol/L (ref 3.5–5.1)
Sodium: 149 mmol/L — ABNORMAL HIGH (ref 135–145)

## 2017-02-13 LAB — GLUCOSE, CAPILLARY
GLUCOSE-CAPILLARY: 231 mg/dL — AB (ref 65–99)
GLUCOSE-CAPILLARY: 86 mg/dL (ref 65–99)
GLUCOSE-CAPILLARY: 71 mg/dL (ref 65–99)
GLUCOSE-CAPILLARY: 72 mg/dL (ref 65–99)
Glucose-Capillary: 105 mg/dL — ABNORMAL HIGH (ref 65–99)
Glucose-Capillary: 132 mg/dL — ABNORMAL HIGH (ref 65–99)
Glucose-Capillary: 151 mg/dL — ABNORMAL HIGH (ref 65–99)
Glucose-Capillary: 177 mg/dL — ABNORMAL HIGH (ref 65–99)
Glucose-Capillary: 61 mg/dL — ABNORMAL LOW (ref 65–99)
Glucose-Capillary: 70 mg/dL (ref 65–99)

## 2017-02-13 LAB — VITAMIN D 25 HYDROXY (VIT D DEFICIENCY, FRACTURES): VIT D 25 HYDROXY: 17.5 ng/mL — AB (ref 30.0–100.0)

## 2017-02-13 LAB — PROTIME-INR
INR: 1.34
PROTHROMBIN TIME: 16.7 s — AB (ref 11.4–15.2)

## 2017-02-13 LAB — APTT: aPTT: 31 seconds (ref 24–36)

## 2017-02-13 SURGERY — OPEN REDUCTION INTERNAL FIXATION (ORIF) PELVIC FRACTURE
Anesthesia: General

## 2017-02-13 MED ORDER — SODIUM CHLORIDE 0.9 % IV SOLN
0.0000 ug/kg/h | INTRAVENOUS | Status: DC
  Filled 2017-02-13: qty 2

## 2017-02-13 MED ORDER — FENTANYL CITRATE (PF) 100 MCG/2ML IJ SOLN
INTRAMUSCULAR | Status: DC | PRN
  Administered 2017-02-13 (×2): 50 ug via INTRAVENOUS

## 2017-02-13 MED ORDER — SODIUM CHLORIDE 0.9 % IV SOLN: Freq: Once | INTRAVENOUS | Status: DC

## 2017-02-13 MED ORDER — ALBUMIN HUMAN 5 % IV SOLN
12.5000 g | Freq: Once | INTRAVENOUS | Status: AC
  Administered 2017-02-13: 12.5 g via INTRAVENOUS

## 2017-02-13 MED ORDER — PHENYLEPHRINE HCL 10 MG/ML IJ SOLN
INTRAVENOUS | Status: DC | PRN
  Administered 2017-02-13: 20 ug/min via INTRAVENOUS

## 2017-02-13 MED ORDER — SODIUM CHLORIDE 0.9 % IV SOLN
Freq: Once | INTRAVENOUS | Status: AC
  Administered 2017-02-13: 15:00:00 via INTRAVENOUS

## 2017-02-13 MED ORDER — MIDAZOLAM HCL 2 MG/2ML IJ SOLN
INTRAMUSCULAR | Status: AC
  Filled 2017-02-13: qty 2

## 2017-02-13 MED ORDER — DEXMEDETOMIDINE HCL 200 MCG/2ML IV SOLN
0.0000 ug/kg/h | INTRAVENOUS | Status: DC
  Administered 2017-02-13: 0.8 ug/kg/h via INTRAVENOUS
  Administered 2017-02-14: 1 ug/kg/h via INTRAVENOUS
  Administered 2017-02-14: 0.9 ug/kg/h via INTRAVENOUS
  Filled 2017-02-13 (×3): qty 2

## 2017-02-13 MED ORDER — VITAL HIGH PROTEIN PO LIQD: 1000.0000 mL | ORAL | Status: DC

## 2017-02-13 MED ORDER — LACTATED RINGERS IV SOLN
INTRAVENOUS | Status: DC | PRN
Start: 2017-02-13 — End: 2017-02-13
  Administered 2017-02-13: 13:00:00 via INTRAVENOUS

## 2017-02-13 MED ORDER — MIDAZOLAM HCL 5 MG/5ML IJ SOLN
INTRAMUSCULAR | Status: DC | PRN
  Administered 2017-02-13: 2 mg via INTRAVENOUS

## 2017-02-13 MED ORDER — ARTIFICIAL TEARS OPHTHALMIC OINT
TOPICAL_OINTMENT | OPHTHALMIC | Status: DC | PRN
  Administered 2017-02-13: 1 via OPHTHALMIC

## 2017-02-13 MED ORDER — FREE WATER
200.0000 mL | Freq: Three times a day (TID) | Status: DC
  Administered 2017-02-13 – 2017-02-15 (×5): 200 mL

## 2017-02-13 MED ORDER — ALBUMIN HUMAN 5 % IV SOLN
INTRAVENOUS | Status: AC
  Filled 2017-02-13: qty 250

## 2017-02-13 MED ORDER — PIVOT 1.5 CAL PO LIQD
1000.0000 mL | ORAL | Status: DC
  Administered 2017-02-13 – 2017-02-22 (×11): 1000 mL
  Filled 2017-02-13 (×16): qty 1000

## 2017-02-13 MED ORDER — ALBUMIN HUMAN 5 % IV SOLN
INTRAVENOUS | Status: DC | PRN
  Administered 2017-02-13: 15:00:00 via INTRAVENOUS

## 2017-02-13 MED ORDER — 0.9 % SODIUM CHLORIDE (POUR BTL) OPTIME
TOPICAL | Status: DC | PRN
  Administered 2017-02-13: 1000 mL

## 2017-02-13 MED ORDER — DEXTROSE 50 % IV SOLN
INTRAVENOUS | Status: DC | PRN
  Administered 2017-02-13: 25 mL via INTRAVENOUS

## 2017-02-13 MED ORDER — ROCURONIUM BROMIDE 10 MG/ML (PF) SYRINGE
PREFILLED_SYRINGE | INTRAVENOUS | Status: DC | PRN
  Administered 2017-02-13: 10 mg via INTRAVENOUS
  Administered 2017-02-13: 50 mg via INTRAVENOUS
  Administered 2017-02-13: 20 mg via INTRAVENOUS

## 2017-02-13 MED ORDER — FENTANYL CITRATE (PF) 250 MCG/5ML IJ SOLN
INTRAMUSCULAR | Status: AC
  Filled 2017-02-13: qty 5

## 2017-02-13 MED ORDER — ARTIFICIAL TEARS OPHTHALMIC OINT
TOPICAL_OINTMENT | OPHTHALMIC | Status: DC | PRN
  Administered 2017-02-14: 1 via OPHTHALMIC
  Administered 2017-02-17: 22:00:00 via OPHTHALMIC
  Filled 2017-02-13 (×2): qty 3.5

## 2017-02-13 MED ORDER — DEXTROSE 50 % IV SOLN
INTRAVENOUS | Status: AC
  Administered 2017-02-13: 25 mL
  Filled 2017-02-13: qty 50

## 2017-02-13 SURGICAL SUPPLY — 55 items
BLADE CLIPPER SURG (BLADE) IMPLANT
BRUSH SCRUB SURG 4.25 DISP (MISCELLANEOUS) ×6 IMPLANT
DRAIN CHANNEL 15F RND FF W/TCR (WOUND CARE) IMPLANT
DRAPE C-ARM 42X72 X-RAY (DRAPES) ×3 IMPLANT
DRAPE C-ARMOR (DRAPES) ×3 IMPLANT
DRAPE LAPAROTOMY TRNSV 102X78 (DRAPE) ×3 IMPLANT
DRAPE SURG 17X23 STRL (DRAPES) ×3 IMPLANT
DRAPE U-SHAPE 47X51 STRL (DRAPES) ×3 IMPLANT
DRSG VAC ATS MED SENSATRAC (GAUZE/BANDAGES/DRESSINGS) ×3 IMPLANT
ELECT REM PT RETURN 9FT ADLT (ELECTROSURGICAL) ×3
ELECTRODE REM PT RTRN 9FT ADLT (ELECTROSURGICAL) ×1 IMPLANT
EVACUATOR SILICONE 100CC (DRAIN) IMPLANT
GLOVE BIO SURGEON STRL SZ7.5 (GLOVE) ×3 IMPLANT
GLOVE BIO SURGEON STRL SZ8 (GLOVE) ×3 IMPLANT
GLOVE BIOGEL PI IND STRL 7.5 (GLOVE) ×1 IMPLANT
GLOVE BIOGEL PI IND STRL 8 (GLOVE) ×1 IMPLANT
GLOVE BIOGEL PI INDICATOR 7.5 (GLOVE) ×2
GLOVE BIOGEL PI INDICATOR 8 (GLOVE) ×2
GOWN STRL REUS W/ TWL LRG LVL3 (GOWN DISPOSABLE) ×2 IMPLANT
GOWN STRL REUS W/ TWL XL LVL3 (GOWN DISPOSABLE) ×1 IMPLANT
GOWN STRL REUS W/TWL LRG LVL3 (GOWN DISPOSABLE) ×4
GOWN STRL REUS W/TWL XL LVL3 (GOWN DISPOSABLE) ×2
KIT BASIN OR (CUSTOM PROCEDURE TRAY) ×3 IMPLANT
KIT DRSG PREVENA PLUS 7DAY 125 (MISCELLANEOUS) ×3 IMPLANT
KIT PREVENA INCISION MGT 13 (CANNISTER) ×3 IMPLANT
KIT ROOM TURNOVER OR (KITS) ×3 IMPLANT
MANIFOLD NEPTUNE II (INSTRUMENTS) ×3 IMPLANT
NS IRRIG 1000ML POUR BTL (IV SOLUTION) ×6 IMPLANT
PACK TOTAL JOINT (CUSTOM PROCEDURE TRAY) ×3 IMPLANT
PAD ARMBOARD 7.5X6 YLW CONV (MISCELLANEOUS) ×6 IMPLANT
PLATE PROXIMAL ACULOC 2 WD RT (Plate) ×3 IMPLANT
PLATE SYMPHYSIS 92.5M 6H (Plate) ×3 IMPLANT
SCREW CORT 2.5XFT 44X3.5XST (Screw) ×1 IMPLANT
SCREW CORTEX ST MATTA 3.5X22MM (Screw) ×3 IMPLANT
SCREW CORTEX ST MATTA 3.5X24 (Screw) ×3 IMPLANT
SCREW CORTEX ST MATTA 3.5X32MM (Screw) ×6 IMPLANT
SCREW CORTEX ST MATTA 3.5X50MM (Screw) ×6 IMPLANT
SCREW CORTEX ST MATTA 3.5X60MM (Screw) ×3 IMPLANT
SCREW CORTICAL 3.5X44MM (Screw) ×2 IMPLANT
SPONGE LAP 18X18 X RAY DECT (DISPOSABLE) IMPLANT
STAPLER VISISTAT 35W (STAPLE) ×3 IMPLANT
SUCTION FRAZIER HANDLE 10FR (MISCELLANEOUS) ×2
SUCTION TUBE FRAZIER 10FR DISP (MISCELLANEOUS) ×1 IMPLANT
SUT ETHILON 2 0 FS 18 (SUTURE) ×6 IMPLANT
SUT VIC AB 0 CT1 27 (SUTURE) ×4
SUT VIC AB 0 CT1 27XBRD ANBCTR (SUTURE) ×2 IMPLANT
SUT VIC AB 1 CT1 18XCR BRD 8 (SUTURE) ×2 IMPLANT
SUT VIC AB 1 CT1 8-18 (SUTURE) ×4
SUT VIC AB 2-0 CT1 27 (SUTURE) ×4
SUT VIC AB 2-0 CT1 TAPERPNT 27 (SUTURE) ×2 IMPLANT
TOWEL OR 17X24 6PK STRL BLUE (TOWEL DISPOSABLE) ×3 IMPLANT
TOWEL OR 17X26 10 PK STRL BLUE (TOWEL DISPOSABLE) ×6 IMPLANT
TRAY FOLEY W/METER SILVER 16FR (SET/KITS/TRAYS/PACK) IMPLANT
WATER STERILE IRR 1000ML POUR (IV SOLUTION) ×12 IMPLANT
WND VAC CANISTER 500ML (MISCELLANEOUS) ×3 IMPLANT
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
## 2017-02-13 NOTE — Anesthesia Procedure Notes (Addendum)
Arterial Line Insertion Start/End8/27/2018 1:50 PM, 02/13/2017 1:56 PM Performed by: Honor Junes, anesthesiologist  Patient location: OR. Preanesthetic checklist: patient identified, IV checked, site marked, risks and benefits discussed, surgical consent, monitors and equipment checked, pre-op evaluation, timeout performed and anesthesia consent Left, radial was placed Catheter size: 20 G Hand hygiene performed  and maximum sterile barriers used   Attempts: 1 Procedure performed without using ultrasound guided technique. Following insertion, dressing applied and Biopatch. Post procedure assessment: normal and unchanged  Patient tolerated the procedure well with no immediate complications.

## 2017-02-13 NOTE — Transfer of Care (Signed)
Immediate Anesthesia Transfer of Care Note  Patient: Peter [name]  Procedure(s) Performed: Procedure(s): OPEN REDUCTION INTERNAL FIXATION (ORIF) PELVIC FRACTURE (N/A)  Patient Location: ICU  Anesthesia Type:General  Level of Consciousness: sedated, unresponsive and Patient remains intubated per anesthesia plan  Airway & Oxygen Therapy: Patient remains intubated per anesthesia plan and Patient placed on Ventilator (see vital sign flow sheet for setting)  Post-op Assessment: Report given to RN and Post -op Vital signs reviewed and stable  Post vital signs: Reviewed and stable  Last Vitals:  Vitals:   02/13/17 1300 02/13/17 1601  BP: 132/88   Pulse: 87 81  Resp: 20 20  Temp: 37.6 C   SpO2: 100% 98%    Last Pain:  Vitals:   02/13/17 0730  TempSrc: Core (Comment)  PainSc:          Complications: No apparent anesthesia complications

## 2017-02-13 NOTE — Progress Notes (Signed)
Report/handoff given to [place] for pt to transport to surgery for ORIF. Pt transported on the bed with OR personnel.
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
## 2017-02-13 NOTE — Progress Notes (Signed)
Pt returned from OR. Blood bank ID is missing. MD notified. Another Type and Screen sent down stat. Orders to transfuse received.

## 2017-02-13 NOTE — Progress Notes (Signed)
Inpatient Diabetes Program Recommendations  AACE/ADA: New Consensus Statement on Inpatient Glycemic Control (2015)  Target Ranges:  Prepandial:   less than 140 mg/dL      Peak postprandial:   less than 180 mg/dL (1-2 hours)      Critically ill patients:  140 - 180 mg/dL   Lab Results  Component Value Date   GLUCAP 86 02/13/2017   HGBA1C 8.6 (H) 02/07/2017    Review of Glycemic Control  Had hypo this am. Blood sugars have been running < 100 mg/dL. May need to adjust correction insulin. To OR this afternoon.  Inpatient Diabetes Program Recommendations:   Decrease Novolog to 0-15 units Q4H.  Continue to follow.  Thank you. Lorenda Peck, RD, LDN, CDE Inpatient Diabetes Coordinator [phone]

## 2017-02-13 NOTE — Progress Notes (Signed)
Subjective: Patient is sedated with Precedex and about to go to surgery  Exam: Vitals:   02/13/17 1200 02/13/17 1300  BP: 92/63 132/88  Pulse: 77 87  Resp: (!) 0 20  Temp: 99.7 F (37.6 C) 99.7 F (37.6 C)  SpO2: 100% 100%   Gen: In bed, Intubated Resp: Ventilated Abd: soft, nt He appears jaundiced  He is on Precedex Neuro: MS: Awakens to noxious stimuli, follows commands to stick out tongue, wiggle toes CN: Pupils are reactive bilaterally, blinks to eyelid stimulation bilaterally, does not fixate or track Motor: He does not move his arms, but does wiggle toes bilaterally to command Sensory: Does not clearly grimace to noxious stimulation  Pertinent Labs: INR 1.3  Impression: 56 year old male with acute mental status change with left-sided gaze. Concern for possible seizure. He appears to be improving. EEG performed 8/25 with a dense ongoing seizure. He is going for surgery for his fracture today, once done he will be able to get an MRI.  Recommendations: 1) Keppra 500 mg twice a day 2) MRI once surgery is complete. 3) neurology will continue to follow   Roland Rack, MD [facility] [phone]  If 7pm- 7am, please page neurology on call as listed in [place].

## 2017-02-14 ENCOUNTER — Inpatient Hospital Stay (HOSPITAL_COMMUNITY): Payer: BLUE CROSS/BLUE SHIELD

## 2017-02-14 ENCOUNTER — Encounter (HOSPITAL_COMMUNITY): Payer: Self-pay | Admitting: Orthopedic Surgery

## 2017-02-14 DIAGNOSIS — R569 Unspecified convulsions: Secondary | ICD-10-CM

## 2017-02-14 LAB — CBC
HCT: 30.1 % — ABNORMAL LOW (ref 39.0–52.0)
HEMATOCRIT: 29.9 % — AB (ref 39.0–52.0)
Hemoglobin: 10.1 g/dL — ABNORMAL LOW (ref 13.0–17.0)
Hemoglobin: 10.2 g/dL — ABNORMAL LOW (ref 13.0–17.0)
MCH: 29.4 pg (ref 26.0–34.0)
MCH: 29.7 pg (ref 26.0–34.0)
MCHC: 33.8 g/dL (ref 30.0–36.0)
MCHC: 33.9 g/dL (ref 30.0–36.0)
MCV: 86.9 fL (ref 78.0–100.0)
MCV: 87.5 fL (ref 78.0–100.0)
PLATELETS: 92 10*3/uL — AB (ref 150–400)
Platelets: 90 10*3/uL — ABNORMAL LOW (ref 150–400)
RBC: 3.44 MIL/uL — AB (ref 4.22–5.81)
RBC: 3.44 MIL/uL — ABNORMAL LOW (ref 4.22–5.81)
RDW: 15.9 % — AB (ref 11.5–15.5)
RDW: 15.9 % — ABNORMAL HIGH (ref 11.5–15.5)
WBC: 10.1 10*3/uL (ref 4.0–10.5)
WBC: 9.8 10*3/uL (ref 4.0–10.5)

## 2017-02-14 LAB — BASIC METABOLIC PANEL
ANION GAP: 8 (ref 5–15)
BUN: 58 mg/dL — AB (ref 6–20)
CHLORIDE: 121 mmol/L — AB (ref 101–111)
CO2: 18 mmol/L — AB (ref 22–32)
Calcium: 7.7 mg/dL — ABNORMAL LOW (ref 8.9–10.3)
Creatinine, Ser: 1.52 mg/dL — ABNORMAL HIGH (ref 0.61–1.24)
GFR calc Af Amer: 57 mL/min — ABNORMAL LOW (ref >60)
GFR, EST NON AFRICAN AMERICAN: 50 mL/min — AB (ref >60)
GLUCOSE: 207 mg/dL — AB (ref 65–99)
POTASSIUM: 4.1 mmol/L (ref 3.5–5.1)
Sodium: 147 mmol/L — ABNORMAL HIGH (ref 135–145)

## 2017-02-14 LAB — TYPE AND SCREEN
ABO/RH(D): AB POS
Antibody Screen: NEGATIVE
UNIT DIVISION: 0
UNIT DIVISION: 0
UNIT DIVISION: 0
Unit division: 0
Unit division: 0

## 2017-02-14 LAB — BPAM RBC
BLOOD PRODUCT EXPIRATION DATE: 201809102359
BLOOD PRODUCT EXPIRATION DATE: 201809122359
Blood Product Expiration Date: 201809102359
Blood Product Expiration Date: 201809102359
Blood Product Expiration Date: 201809202359
ISSUE DATE / TIME: 201808251114
ISSUE DATE / TIME: 201808271355
ISSUE DATE / TIME: 201808271554
ISSUE DATE / TIME: 201808271731
UNIT TYPE AND RH: 8400
Unit Type and Rh: 8400
Unit Type and Rh: 8400
Unit Type and Rh: 8400
Unit Type and Rh: 8400

## 2017-02-14 LAB — GLUCOSE, CAPILLARY
GLUCOSE-CAPILLARY: 189 mg/dL — AB (ref 65–99)
Glucose-Capillary: 175 mg/dL — ABNORMAL HIGH (ref 65–99)
Glucose-Capillary: 189 mg/dL — ABNORMAL HIGH (ref 65–99)
Glucose-Capillary: 134 mg/dL — ABNORMAL HIGH (ref 65–99)

## 2017-02-14 MED ORDER — SODIUM CHLORIDE 0.9 % IV SOLN
1.0000 g | Freq: Once | INTRAVENOUS | Status: AC
  Administered 2017-02-14: 1 g via INTRAVENOUS
  Filled 2017-02-14: qty 10

## 2017-02-14 MED ORDER — QUETIAPINE FUMARATE 25 MG PO TABS
50.0000 mg | ORAL_TABLET | Freq: Two times a day (BID) | ORAL | Status: DC
  Administered 2017-02-14 – 2017-02-16 (×5): 50 mg
  Filled 2017-02-14 (×5): qty 2

## 2017-02-14 MED ORDER — CLONAZEPAM 0.1 MG/ML ORAL SUSPENSION
0.5000 mg | Freq: Two times a day (BID) | ORAL | Status: DC
Start: 2017-02-14 — End: 2017-02-14

## 2017-02-14 MED ORDER — DEXMEDETOMIDINE HCL IN NACL 400 MCG/100ML IV SOLN
0.0000 ug/kg/h | INTRAVENOUS | Status: DC
  Administered 2017-02-14 – 2017-02-15 (×9): 1.2 ug/kg/h via INTRAVENOUS
  Administered 2017-02-16: 1 ug/kg/h via INTRAVENOUS
  Administered 2017-02-16: 1.2 ug/kg/h via INTRAVENOUS
  Administered 2017-02-16: 1 ug/kg/h via INTRAVENOUS
  Administered 2017-02-16 (×2): 1.2 ug/kg/h via INTRAVENOUS
  Administered 2017-02-17: 0.4 ug/kg/h via INTRAVENOUS
  Administered 2017-02-17 (×2): 1 ug/kg/h via INTRAVENOUS
  Administered 2017-02-18 (×2): 0.5 ug/kg/h via INTRAVENOUS
  Administered 2017-02-19: 0.501 ug/kg/h via INTRAVENOUS
  Administered 2017-02-19 – 2017-02-20 (×4): 0.5 ug/kg/h via INTRAVENOUS
  Filled 2017-02-14 (×26): qty 100

## 2017-02-14 MED ORDER — ENOXAPARIN SODIUM 40 MG/0.4ML ~~LOC~~ SOLN
40.0000 mg | SUBCUTANEOUS | Status: DC
  Administered 2017-02-14 – 2017-02-27 (×14): 40 mg via SUBCUTANEOUS
  Filled 2017-02-14 (×14): qty 0.4

## 2017-02-14 MED ORDER — PIPERACILLIN-TAZOBACTAM 3.375 G IVPB
3.3750 g | Freq: Three times a day (TID) | INTRAVENOUS | Status: DC
  Administered 2017-02-14 – 2017-02-17 (×9): 3.375 g via INTRAVENOUS
  Filled 2017-02-14 (×10): qty 50

## 2017-02-14 MED ORDER — CLONAZEPAM 0.5 MG PO TABS
0.5000 mg | ORAL_TABLET | Freq: Two times a day (BID) | ORAL | Status: DC
  Administered 2017-02-14 – 2017-02-16 (×5): 0.5 mg
  Filled 2017-02-14 (×5): qty 1

## 2017-02-14 NOTE — Progress Notes (Signed)
Patient transported from room 4N 29 to MRI and back to room 4N 29 with no complications. Vital signs are srable. Sats are 100%. RT will continue to monitor.
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
## 2017-02-14 NOTE — Progress Notes (Signed)
Subjective: Patient is improved somewhat overnight  Exam: Vitals:   02/14/17 0900 02/14/17 1000  BP: 111/68 136/85  Pulse: 82 95  Resp: (!) 21 (!) 29  Temp: 99.9 F (37.7 C) 99.9 F (37.7 C)  SpO2: 98% 97%   Gen: In bed, Intubated Resp: Ventilated Abd: soft, nt He appears jaundiced  He is on Precedex Neuro: MS: Awakens to noxious stimuli, follows commands to stick out tongue, wiggle toes CN: Pupils are reactive bilaterally, blinks to eyelid stimulation bilaterally, does not fixate or track Motor: He does not move his arms, but does wiggle toes bilaterally to command Sensory: Does not clearly grimace to noxious stimulation  Impression: 56 year old male with acute mental status change with left-sided gaze. Concern for possible seizure. He appears to be improving. EEG performed 8/25 with No evidence of ongoing seizure. MRI is pending  Given that he is not moving his arms as well as his legs, I would favor getting an MRI of his cervical spine as well.  Recommendations: 1) Keppra 500 mg twice a day 2) MRI brain and cervical spine. 3) neurology will continue to follow   Roland Rack, MD [facility] [phone]  If 7pm- 7am, please page neurology on call as listed in [place].

## 2017-02-14 NOTE — Progress Notes (Signed)
Follow up - Trauma Critical Care  Patient Details:    Peter [name] is an 56 y.o. male.  Lines/tubes : Airway 8 mm (Active)  Secured at (cm) 24 cm 02/14/2017  7:34 AM  Measured From Lips 02/14/2017  7:34 AM  Secured Location Left 02/14/2017  7:34 AM  Secured By [employer] 02/14/2017  7:34 AM  Tube Holder Repositioned Yes 02/14/2017  7:34 AM  Cuff Pressure (cm H2O) 26 cm H2O 02/14/2017  7:34 AM  Site Condition Dry 02/14/2017  7:34 AM     Arterial Line 02/13/17 Radial (Active)  Site Assessment Clean;Dry;Intact 02/13/2017  8:00 PM  Line Status Pulsatile blood flow 02/13/2017  8:00 PM  Art Line Waveform Appropriate 02/13/2017  8:00 PM  Art Line Interventions Zeroed and calibrated;Leveled;Connections checked and tightened;Flushed per protocol 02/13/2017  8:00 PM  Color/Movement/Sensation Capillary refill less than 3 sec 02/13/2017  8:00 PM  Dressing Type Transparent;Occlusive 02/13/2017  8:00 PM  Dressing Status Clean;Dry;Intact;Antimicrobial disc in place 02/13/2017  8:00 PM     Chest Tube 1 Left Pleural 14 Fr. (Active)  Suction -20 cm H2O 02/13/2017  8:00 PM  Chest Tube Air Leak None 02/13/2017  8:00 PM  Patency Intervention Tip/tilt 02/13/2017  8:00 PM  Drainage Description Serosanguineous 02/13/2017  8:00 PM  Dressing Status Clean;Dry;Intact 02/13/2017  8:00 PM  Dressing Intervention Dressing changed 02/09/2017  8:00 AM  Site Assessment Clean;Dry;Intact 02/13/2017  8:00 PM  Surrounding Skin Unable to view 02/13/2017  8:00 PM  Output (mL) 110 mL 02/14/2017  6:00 AM     Negative Pressure Wound Therapy Abdomen Anterior (Active)  Site / Wound Assessment Clean;Dry 02/13/2017  8:00 PM  Peri-wound Assessment Intact 02/13/2017  8:00 PM  Wound filler - Black foam 1 02/13/2017  8:00 PM  Cycle Continuous;On 02/13/2017  8:00 PM  Target Pressure (mmHg) 125 02/13/2017  8:00 PM  Dressing Status Intact 02/13/2017  8:00 PM  Output (mL) 0 mL 02/13/2017  6:00 PM     NG/OG Tube Orogastric 16 Fr. Right  mouth Xray (Active)  Site Assessment Clean;Dry;Intact 02/13/2017  8:00 PM  Ongoing Placement Verification No change in cm markings or external length of tube from initial placement;No change in respiratory status;No acute changes, not attributed to clinical condition 02/13/2017  8:00 PM  Status Infusing tube feed 02/13/2017  8:00 PM  Drainage Appearance Green 02/08/2017 10:00 AM  Intake (mL) 30 mL 02/13/2017 10:00 PM  Output (mL) 150 mL 02/13/2017 10:00 PM     Urethral Catheter [name], EMT Double-lumen 16 Fr. (Active)  Indication for Insertion or Continuance of Catheter Peri-operative use for selective surgical procedure;Unstable spinal/crush injuries 02/13/2017  8:00 PM  Site Assessment Clean;Intact 02/13/2017  8:00 PM  Catheter Maintenance Bag below level of bladder;Catheter secured;Drainage bag/tubing not touching floor;No dependent loops;Seal intact 02/13/2017  8:00 PM  Collection Container Standard drainage bag 02/13/2017  8:00 PM  Securement Method Securing device (Describe) 02/13/2017  8:00 PM  Urinary Catheter Interventions Unclamped 02/13/2017  8:00 PM  Output (mL) 200 mL 02/14/2017  4:00 AM    Microbiology/Sepsis markers: Results for orders placed or performed during the hospital encounter of 02/07/17  MRSA PCR Screening     Status: None   Collection Time: 02/08/17  4:08 AM  Result Value Ref Range Status   MRSA by PCR NEGATIVE NEGATIVE Final    Comment:        The GeneXpert MRSA Assay (FDA approved for NASAL specimens only), is one component of a comprehensive MRSA  colonization surveillance program. It is not intended to diagnose MRSA infection nor to guide or monitor treatment for MRSA infections.   Culture, respiratory (NON-Expectorated)     Status: None   Collection Time: 02/10/17 10:59 AM  Result Value Ref Range Status   Specimen Description TRACHEAL ASPIRATE  Final   Special Requests Normal  Final   Gram Stain   Final    ABUNDANT WBC PRESENT, PREDOMINANTLY PMN RARE  SQUAMOUS EPITHELIAL CELLS PRESENT ABUNDANT GRAM NEGATIVE COCCI IN PAIRS ABUNDANT GRAM NEGATIVE RODS MODERATE GRAM POSITIVE COCCI IN CHAINS    Culture ABUNDANT Consistent with normal respiratory flora.  Final   Report Status 02/12/2017 FINAL  Final  Culture, blood (Routine X 2) w Reflex to ID Panel     Status: None (Preliminary result)   Collection Time: 02/11/17  3:25 PM  Result Value Ref Range Status   Specimen Description BLOOD RIGHT HAND  Final   Special Requests   Final    BOTTLES DRAWN AEROBIC ONLY Blood Culture adequate volume   Culture NO GROWTH 2 DAYS  Final   Report Status PENDING  Incomplete  Culture, blood (Routine X 2) w Reflex to ID Panel     Status: None (Preliminary result)   Collection Time: 02/11/17  3:25 PM  Result Value Ref Range Status   Specimen Description BLOOD RIGHT HAND  Final   Special Requests   Final    BOTTLES DRAWN AEROBIC ONLY Blood Culture adequate volume   Culture NO GROWTH 2 DAYS  Final   Report Status PENDING  Incomplete  Surgical PCR screen     Status: None   Collection Time: 02/13/17  6:30 AM  Result Value Ref Range Status   MRSA, PCR NEGATIVE NEGATIVE Final   Staphylococcus aureus NEGATIVE NEGATIVE Final    Comment:        The Xpert SA Assay (FDA approved for NASAL specimens in patients over 34 years of age), is one component of a comprehensive surveillance program.  Test performance has been validated by [facility] for patients greater than or equal to 21 year old. It is not intended to diagnose infection nor to guide or monitor treatment.     Anti-infectives:  Anti-infectives    Start     Dose/Rate Route Frequency Ordered Stop   02/11/17 1000  piperacillin-tazobactam (ZOSYN) IVPB 3.375 g     3.375 g 12.5 mL/hr over 240 Minutes Intravenous Every 8 hours 02/11/17 0858     02/11/17 1000  vancomycin (VANCOCIN) IVPB 750 mg/150 ml premix     750 mg 150 mL/hr over 60 Minutes Intravenous Every 12 hours 02/11/17 0858     02/07/17  2000  ceFAZolin (ANCEF) IVPB 1 g/50 mL premix     1 g 100 mL/hr over 30 Minutes Intravenous Every 8 hours 02/07/17 1416 02/08/17 1543      Best Practice/Protocols:  VTE Prophylaxis: Lovenox (prophylaxtic dose) Continous Sedation  Consults: Treatment Team:  Altamese Uvalda, MD    Subjective:    Overnight Issues:   Objective:  Vital signs for last 24 hours: Temp:  [97.9 F (36.6 C)-100.4 F (38 C)] 99.7 F (37.6 C) (08/28 0800) Pulse Rate:  [36-108] 52 (08/28 0800) Resp:  [0-31] 22 (08/28 0800) BP: (92-182)/(63-91) 147/75 (08/28 0800) SpO2:  [89 %-100 %] 98 % (08/28 0800) Arterial Line BP: (100-195)/(57-90) 134/71 (08/28 0700) FiO2 (%):  [40 %] 40 % (08/28 0734) Weight:  [104.3 kg (229 lb 15 oz)] 104.3 kg (229 lb 15 oz) (08/28  0600)  Hemodynamic parameters for last 24 hours:    Intake/Output from previous day: 08/27 0701 - 08/28 0700 In: 5410 [I.V.:3320; Blood:645; NG/GT:790; IV Piggyback:655] Out: 1965 [JJOAC:1660; Emesis/NG output:150; Blood:150; Chest Tube:280]  Intake/Output this shift: No intake/output data recorded.  Vent settings for last 24 hours: Vent Mode: PRVC FiO2 (%):  [40 %] 40 % Set Rate:  [20 bmp] 20 bmp Vt Set:  [580 mL] 580 mL PEEP:  [5 cmH20] 5 cmH20 Plateau Pressure:  [16 cmH20-29 cmH20] 18 cmH20  Physical Exam:  General: on vent Neuro: arouses and F/C at times HEENT/Neck: ETT and collar Resp: clear to auscultation bilaterally CVS: regular rate and rhythm, S1, S2 normal, no murmur, click, rub or gallop GI: soft, SP fullness, Prevena VAC Extremities: edema 3+  Results for orders placed or performed during the hospital encounter of 02/07/17 (from the past 24 hour(s))  Glucose, capillary     Status: None   Collection Time: 02/13/17  8:42 AM  Result Value Ref Range   Glucose-Capillary 70 65 - 99 mg/dL   Comment 1 Notify RN    Comment 2 Document in Chart   CBC     Status: Abnormal   Collection Time: 02/13/17 10:23 AM  Result Value Ref  Range   WBC 9.0 4.0 - 10.5 K/uL   RBC 3.01 (L) 4.22 - 5.81 MIL/uL   Hemoglobin 8.8 (L) 13.0 - 17.0 g/dL   HCT 26.5 (L) 39.0 - 52.0 %   MCV 88.0 78.0 - 100.0 fL   MCH 29.2 26.0 - 34.0 pg   MCHC 33.2 30.0 - 36.0 g/dL   RDW 16.1 (H) 11.5 - 15.5 %   Platelets 99 (L) 150 - 400 K/uL  Protime-INR     Status: Abnormal   Collection Time: 02/13/17 10:23 AM  Result Value Ref Range   Prothrombin Time 16.7 (H) 11.4 - 15.2 seconds   INR 1.34   APTT     Status: None   Collection Time: 02/13/17 10:23 AM  Result Value Ref Range   aPTT 31 24 - 36 seconds  Prepare RBC     Status: None   Collection Time: 02/13/17 10:30 AM  Result Value Ref Range   Order Confirmation ORDER PROCESSED BY BLOOD BANK   Glucose, capillary     Status: Abnormal   Collection Time: 02/13/17 12:07 PM  Result Value Ref Range   Glucose-Capillary 61 (L) 65 - 99 mg/dL   Comment 1 Notify RN    Comment 2 Document in Chart   Glucose, capillary     Status: None   Collection Time: 02/13/17 12:49 PM  Result Value Ref Range   Glucose-Capillary 86 65 - 99 mg/dL  Prepare RBC (crossmatch)     Status: None   Collection Time: 02/13/17  1:56 PM  Result Value Ref Range   Order Confirmation ORDER PROCESSED BY BLOOD BANK   I-STAT 7, (LYTES, BLD GAS, ICA, H+H)     Status: Abnormal   Collection Time: 02/13/17  2:35 PM  Result Value Ref Range   pH, Arterial 7.252 (L) 7.350 - 7.450   pCO2 arterial 42.6 32.0 - 48.0 mmHg   pO2, Arterial 78.0 (L) 83.0 - 108.0 mmHg   Bicarbonate 18.8 (L) 20.0 - 28.0 mmol/L   TCO2 20 (L) 22 - 32 mmol/L   O2 Saturation 93.0 %   Acid-base deficit 8.0 (H) 0.0 - 2.0 mmol/L   Sodium 151 (H) 135 - 145 mmol/L   Potassium 4.2 3.5 - 5.1  mmol/L   Calcium, Ion 1.17 1.15 - 1.40 mmol/L   HCT 25.0 (L) 39.0 - 52.0 %   Hemoglobin 8.5 (L) 13.0 - 17.0 g/dL   Patient temperature HIDE    Sample type ARTERIAL   I-STAT 4, (NA,K, GLUC, HGB,HCT)     Status: Abnormal   Collection Time: 02/13/17  2:57 PM  Result Value Ref Range    Sodium 151 (H) 135 - 145 mmol/L   Potassium 3.6 3.5 - 5.1 mmol/L   Glucose, Bld 54 (L) 65 - 99 mg/dL   HCT 20.0 (L) 39.0 - 52.0 %   Hemoglobin 6.8 (LL) 13.0 - 17.0 g/dL   Comment NOTIFIED PHYSICIAN   I-STAT 4, (NA,K, GLUC, HGB,HCT)     Status: Abnormal   Collection Time: 02/13/17  3:25 PM  Result Value Ref Range   Sodium 151 (H) 135 - 145 mmol/L   Potassium 4.3 3.5 - 5.1 mmol/L   Glucose, Bld 102 (H) 65 - 99 mg/dL   HCT 24.0 (L) 39.0 - 52.0 %   Hemoglobin 8.2 (L) 13.0 - 17.0 g/dL  Prepare RBC     Status: None   Collection Time: 02/13/17  3:53 PM  Result Value Ref Range   Order Confirmation ORDER PROCESSED BY BLOOD BANK   Type and screen [place]     Status: None (Preliminary result)   Collection Time: 02/13/17  4:30 PM  Result Value Ref Range   ABO/RH(D) AB POS    Antibody Screen NEG    Sample Expiration 02/16/2017    Unit Number [id number]    Blood Component Type RED CELLS,LR    Unit division 00    Status of Unit ISSUED,FINAL    Transfusion Status OK TO TRANSFUSE    Crossmatch Result Compatible    Unit Number [id number]    Blood Component Type RED CELLS,LR    Unit division 00    Status of Unit ALLOCATED    Transfusion Status OK TO TRANSFUSE    Crossmatch Result Compatible   Glucose, capillary     Status: None   Collection Time: 02/13/17  4:57 PM  Result Value Ref Range   Glucose-Capillary 71 65 - 99 mg/dL  Glucose, capillary     Status: None   Collection Time: 02/13/17  6:37 PM  Result Value Ref Range   Glucose-Capillary 72 65 - 99 mg/dL  Glucose, capillary     Status: Abnormal   Collection Time: 02/13/17  8:16 PM  Result Value Ref Range   Glucose-Capillary 105 (H) 65 - 99 mg/dL  Glucose, capillary     Status: Abnormal   Collection Time: 02/13/17 11:41 PM  Result Value Ref Range   Glucose-Capillary 151 (H) 65 - 99 mg/dL  CBC     Status: Abnormal   Collection Time: 02/14/17 12:04 AM  Result Value Ref Range   WBC 9.8 4.0 - 10.5 K/uL    RBC 3.44 (L) 4.22 - 5.81 MIL/uL   Hemoglobin 10.2 (L) 13.0 - 17.0 g/dL   HCT 30.1 (L) 39.0 - 52.0 %   MCV 87.5 78.0 - 100.0 fL   MCH 29.7 26.0 - 34.0 pg   MCHC 33.9 30.0 - 36.0 g/dL   RDW 15.9 (H) 11.5 - 15.5 %   Platelets 90 (L) 150 - 400 K/uL  Glucose, capillary     Status: Abnormal   Collection Time: 02/14/17  4:01 AM  Result Value Ref Range   Glucose-Capillary 189 (H) 65 - 99 mg/dL  CBC  Status: Abnormal   Collection Time: 02/14/17  4:52 AM  Result Value Ref Range   WBC 10.1 4.0 - 10.5 K/uL   RBC 3.44 (L) 4.22 - 5.81 MIL/uL   Hemoglobin 10.1 (L) 13.0 - 17.0 g/dL   HCT 29.9 (L) 39.0 - 52.0 %   MCV 86.9 78.0 - 100.0 fL   MCH 29.4 26.0 - 34.0 pg   MCHC 33.8 30.0 - 36.0 g/dL   RDW 15.9 (H) 11.5 - 15.5 %   Platelets 92 (L) 150 - 400 K/uL  Basic metabolic panel     Status: Abnormal   Collection Time: 02/14/17  4:52 AM  Result Value Ref Range   Sodium 147 (H) 135 - 145 mmol/L   Potassium 4.1 3.5 - 5.1 mmol/L   Chloride 121 (H) 101 - 111 mmol/L   CO2 18 (L) 22 - 32 mmol/L   Glucose, Bld 207 (H) 65 - 99 mg/dL   BUN 58 (H) 6 - 20 mg/dL   Creatinine, Ser 1.52 (H) 0.61 - 1.24 mg/dL   Calcium 7.7 (L) 8.9 - 10.3 mg/dL   GFR calc non Af Amer 50 (L) >60 mL/min   GFR calc Af Amer 57 (L) >60 mL/min   Anion gap 8 5 - 15  Hepatic function panel     Status: Abnormal   Collection Time: 02/14/17  4:52 AM  Result Value Ref Range   Total Protein 4.6 (L) 6.5 - 8.1 g/dL   Albumin 1.9 (L) 3.5 - 5.0 g/dL   AST 469 (H) 15 - 41 U/L   ALT 432 (H) 17 - 63 U/L   Alkaline Phosphatase 186 (H) 38 - 126 U/L   Total Bilirubin 17.1 (H) 0.3 - 1.2 mg/dL   Bilirubin, Direct 18.3 (H) 0.1 - 0.5 mg/dL   Indirect Bilirubin NEG 1.2 0.3 - 0.9 mg/dL    Assessment & Plan: Present on Admission: . Multiple fractures of ribs, bilateral, initial encounter for closed fracture . Multiple closed anterior-posterior compression fractures of pelvis (HCC)    LOS: 7 days   Additional comments:I reviewed the  patient's new clinical lab test results. and CXR  PHB chicken semi L 2-9 and R 3-8 rib FX with occult R PTX and L HPTX- L CT to H2O seal today Grade 3 spleen lac with small extrav- S/P Angioembolization by Dr. Annamaria Boots 8/21 shock- recurrent 8/25 am,  S/P L internal iliac artery angioembolization by Dr. Annamaria Boots 8/21 and splenic angioembolization 8/21; L gaze/SZ and neuro W/U - for MR head and C spine today, Appreciate Dr. 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 Marcelino Scot 8/21 , S/P symphysis plating by Dr. Marcelino Scot 8/27 Vent dependent acute hypoxic resp failure- cont vent support, PEEP 5, wean as tolerated FEN- free water for hypernatremia, replete hypocalcemia IDDM- lantus 10u QHS, ICU glycemic control protocol ID- see below, vanc/zosyn for puml sepsis, F/U CX, may be able to D/C Vanc as resp cx so far nl flora Renal - AKI improving VTE- PAS, Lovenox Elevated LFTs - direct hyperbilirubinemia from resorbtion of hematomas, follow up ammonia tomorrow and LFTs later this week Septic shock- likely pulmonary, improving with vanc/zosyn, follow CXs, off pressors Dispo- ICU, I spoke with family at the bedside.  Critical Care Total Time*: 45 Minutes  Georganna Skeans, MD, MPH, FACS Trauma: [phone] General Surgery: [phone]  02/14/2017  *Care during the described time interval was provided by me. I have reviewed this patient's available data, including medical history, events of note, physical examination and test results as part of my evaluation.  Patient ID: Peter [name], male   DOB: [birth year], 56 y.o.   MRN: [id number] Critical Care Total Time*: 40 Minutes  Georganna Skeans, MD, MPH, [facility] Trauma: [phone] General Surgery: [phone]  02/14/2017  *Care during the described time interval was provided by me. I have reviewed  this patient's available data, including medical history, events of note, physical examination and test results as part of my evaluation.  Patient ID: Peter [name], male   DOB: February 11, 1961, 56 y.o.   MRN: [id number]

## 2017-02-14 NOTE — Progress Notes (Signed)
Return to floor from MRI

## 2017-02-14 NOTE — Progress Notes (Signed)
MRI report called to me. I called Dr Leonel Ramsay and told him results were called to me, to please check MRI report in EPIC. No orders received.

## 2017-02-14 NOTE — Progress Notes (Signed)
Pharmacy Antibiotic Note  Peter [name] is a 56 y.o. male admitted on 02/07/2017 with sepsis. Vancomycin was discontinued today but he continues on zosyn. All cultures are negative to date. Tmax is 100.4 and WBC is WNL. Scr up slightly today at 1.52 but has been fluctuating throughout his hospitalization.   Plan: Continue zosyn 3.375gm IV Q8H (4 hr inf) F/u renal fxn, C&S, clinical status De-escalate as able *Pharmacy will sign off and only follow peripherally as no dose adjustments are anticipated. Thank you for the consult!  Height: 5\' 10"  (177.8 cm) Weight: 229 lb 15 oz (104.3 kg) IBW/kg (Calculated) : 73  Temp (24hrs), Avg:99.5 F (37.5 C), Min:97.9 F (36.6 C), Max:100.4 F (38 C)   Recent Labs Lab 02/07/17 1405  02/07/17 2030 02/08/17 0300  02/10/17 0441 02/11/17 0520 02/11/17 0916 02/11/17 1350 02/12/17 0348 02/13/17 0444 02/13/17 1023 02/14/17 0004 02/14/17 0452  WBC  --   < > 5.5  --   < > 5.4 5.1  --   --  14.3* 10.1 9.0 9.8 10.1  CREATININE  --   --   --   --   < > 1.31* 1.45*  --   --  2.15* 1.29*  --   --  1.52*  LATICACIDVEN 4.4*  --  2.8* 1.3  --   --   --  2.5* 1.9  --   --   --   --   --   < > = values in this interval not displayed.  Estimated Creatinine Clearance: 65.6 mL/min (A) (by C-G formula based on SCr of 1.52 mg/dL (H)).    No Known Allergies  Salome Arnt, PharmD, BCPS 02/14/2017 8:50 AM

## 2017-02-14 NOTE — Progress Notes (Signed)
Pt transported to MRI with RN and respiratory.

## 2017-02-14 NOTE — Progress Notes (Signed)
Went to check on patient this AM to do morning vent check.  After moving patient tube from right to left patient began coughing and heart rate began to elevate along with blood pressure.  Patient also biting down on ETT.  Placed bite block and RN gave medication to help ease patient.  Will continue to monitor.
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
## 2017-02-14 NOTE — Progress Notes (Signed)
Orthopedic Tech Progress Note Patient Details:  Peter [name] [birth year] [id number]  Ortho Devices Type of Ortho Device: Abduction pillow Ortho Device/Splint Location: applied hip abduction pillow to pt with the help of floor nurse.  pt tolerated application very well.  Floor nurse will handle car of device at this time.  Ortho Device/Splint Interventions: Application, Adjustment   Kristopher Oppenheim 02/14/2017, 5:49 PM

## 2017-02-15 ENCOUNTER — Encounter (HOSPITAL_COMMUNITY): Payer: Self-pay

## 2017-02-15 ENCOUNTER — Inpatient Hospital Stay (HOSPITAL_COMMUNITY): Payer: BLUE CROSS/BLUE SHIELD

## 2017-02-15 DIAGNOSIS — S069X9A Unspecified intracranial injury with loss of consciousness of unspecified duration, initial encounter: Secondary | ICD-10-CM

## 2017-02-15 LAB — GLUCOSE, CAPILLARY
GLUCOSE-CAPILLARY: 170 mg/dL — AB (ref 65–99)
GLUCOSE-CAPILLARY: 205 mg/dL — AB (ref 65–99)
GLUCOSE-CAPILLARY: 192 mg/dL — AB (ref 65–99)
GLUCOSE-CAPILLARY: 212 mg/dL — AB (ref 65–99)
Glucose-Capillary: 175 mg/dL — ABNORMAL HIGH (ref 65–99)
Glucose-Capillary: 182 mg/dL — ABNORMAL HIGH (ref 65–99)
Glucose-Capillary: 211 mg/dL — ABNORMAL HIGH (ref 65–99)
Glucose-Capillary: 222 mg/dL — ABNORMAL HIGH (ref 65–99)

## 2017-02-15 LAB — BLOOD CULTURE ID PANEL (REFLEXED)
ACINETOBACTER BAUMANNII: NOT DETECTED
CANDIDA ALBICANS: NOT DETECTED
CANDIDA GLABRATA: NOT DETECTED
CANDIDA KRUSEI: NOT DETECTED
CANDIDA PARAPSILOSIS: NOT DETECTED
CANDIDA TROPICALIS: NOT DETECTED
Carbapenem resistance: NOT DETECTED
ENTEROBACTER CLOACAE COMPLEX: NOT DETECTED
ESCHERICHIA COLI: NOT DETECTED
Enterobacteriaceae species: DETECTED — AB
Enterococcus species: NOT DETECTED
Haemophilus influenzae: NOT DETECTED
KLEBSIELLA OXYTOCA: DETECTED — AB
KLEBSIELLA PNEUMONIAE: NOT DETECTED
Listeria monocytogenes: NOT DETECTED
Neisseria meningitidis: NOT DETECTED
PROTEUS SPECIES: NOT DETECTED
Pseudomonas aeruginosa: NOT DETECTED
SERRATIA MARCESCENS: NOT DETECTED
STAPHYLOCOCCUS SPECIES: NOT DETECTED
STREPTOCOCCUS AGALACTIAE: NOT DETECTED
Staphylococcus aureus (BCID): NOT DETECTED
Streptococcus pneumoniae: NOT DETECTED
Streptococcus pyogenes: NOT DETECTED
Streptococcus species: NOT DETECTED

## 2017-02-15 LAB — BASIC METABOLIC PANEL
Anion gap: 6 (ref 5–15)
BUN: 62 mg/dL — AB (ref 6–20)
CALCIUM: 7.8 mg/dL — AB (ref 8.9–10.3)
CHLORIDE: 123 mmol/L — AB (ref 101–111)
CO2: 19 mmol/L — AB (ref 22–32)
CREATININE: 1.4 mg/dL — AB (ref 0.61–1.24)
GFR calc non Af Amer: 55 mL/min — ABNORMAL LOW (ref >60)
Glucose, Bld: 222 mg/dL — ABNORMAL HIGH (ref 65–99)
Potassium: 4 mmol/L (ref 3.5–5.1)
SODIUM: 148 mmol/L — AB (ref 135–145)

## 2017-02-15 LAB — CBC
HEMATOCRIT: 29.7 % — AB (ref 39.0–52.0)
HEMOGLOBIN: 10 g/dL — AB (ref 13.0–17.0)
MCH: 29.7 pg (ref 26.0–34.0)
MCHC: 33.7 g/dL (ref 30.0–36.0)
MCV: 88.1 fL (ref 78.0–100.0)
Platelets: 114 10*3/uL — ABNORMAL LOW (ref 150–400)
RBC: 3.37 MIL/uL — ABNORMAL LOW (ref 4.22–5.81)
RDW: 16.3 % — AB (ref 11.5–15.5)
WBC: 9.8 10*3/uL (ref 4.0–10.5)

## 2017-02-15 LAB — AMMONIA: Ammonia: 50 umol/L — ABNORMAL HIGH (ref 9–35)

## 2017-02-15 MED ORDER — FUROSEMIDE 10 MG/ML IJ SOLN
40.0000 mg | Freq: Once | INTRAMUSCULAR | Status: AC
  Administered 2017-02-15: 40 mg via INTRAVENOUS
  Filled 2017-02-15: qty 4

## 2017-02-15 MED ORDER — LACTULOSE 10 GM/15ML PO SOLN
20.0000 g | Freq: Once | ORAL | Status: AC
  Administered 2017-02-15: 20 g
  Filled 2017-02-15: qty 30

## 2017-02-15 MED ORDER — FREE WATER
200.0000 mL | Freq: Four times a day (QID) | Status: DC
  Administered 2017-02-15 – 2017-02-16 (×4): 200 mL

## 2017-02-15 NOTE — Progress Notes (Signed)
PHARMACY - PHYSICIAN COMMUNICATION CRITICAL VALUE ALERT - BLOOD CULTURE IDENTIFICATION (BCID)  Results for orders placed or performed during the hospital encounter of 02/07/17  Blood Culture ID Panel (Reflexed) (Collected: 02/11/2017  3:25 PM)  Result Value Ref Range   Enterococcus species NOT DETECTED NOT DETECTED   Listeria monocytogenes NOT DETECTED NOT DETECTED   Staphylococcus species NOT DETECTED NOT DETECTED   Staphylococcus aureus NOT DETECTED NOT DETECTED   Streptococcus species NOT DETECTED NOT DETECTED   Streptococcus agalactiae NOT DETECTED NOT DETECTED   Streptococcus pneumoniae NOT DETECTED NOT DETECTED   Streptococcus pyogenes NOT DETECTED NOT DETECTED   Acinetobacter baumannii NOT DETECTED NOT DETECTED   Enterobacteriaceae species DETECTED (A) NOT DETECTED   Enterobacter cloacae complex NOT DETECTED NOT DETECTED   Escherichia coli NOT DETECTED NOT DETECTED   Klebsiella oxytoca DETECTED (A) NOT DETECTED   Klebsiella pneumoniae NOT DETECTED NOT DETECTED   Proteus species NOT DETECTED NOT DETECTED   Serratia marcescens NOT DETECTED NOT DETECTED   Carbapenem resistance NOT DETECTED NOT DETECTED   Haemophilus influenzae NOT DETECTED NOT DETECTED   Neisseria meningitidis NOT DETECTED NOT DETECTED   Pseudomonas aeruginosa NOT DETECTED NOT DETECTED   Candida albicans NOT DETECTED NOT DETECTED   Candida glabrata NOT DETECTED NOT DETECTED   Candida krusei NOT DETECTED NOT DETECTED   Candida parapsilosis NOT DETECTED NOT DETECTED   Candida tropicalis NOT DETECTED NOT DETECTED    Name of physician (or Provider) Contacted: Dr. Rosendo Gros (Trauma)  Changes to prescribed antibiotics required: Cont Zosyn for now  Narda Bonds 02/15/2017  6:40 AM

## 2017-02-15 NOTE — Progress Notes (Signed)
Subjective: No significant changes  Exam: Vitals:   02/15/17 1500 02/15/17 1548  BP: (!) 152/62 (!) 144/72  Pulse: (!) 57 (!) 112  Resp: (!) 28 (!) 29  Temp: 99 F (37.2 C)   SpO2: 94% 92%   Gen: In bed, Intubated Resp: Ventilated Abd: soft, nt He appears jaundiced  He is on Precedex Neuro: MS: Awakens to noxious stimuli, follows commands to stick out tongue, wiggle toes CN: Pupils are reactive bilaterally, blinks to eyelid stimulation bilaterally, does not fixate or track Motor: He does grip his left hand to command today, does not move his right hand, but does wiggle toes bilaterally to command Sensory: He does seem to respond to noxious stimulation  Impression: 56 year old male with acute mental status change with left-sided gaze. Concern for possible seizure. He appears to be improving. EEG performed 8/25 with No evidence of ongoing seizure.   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 I would favor continued antiepileptic therapy given the episode that he had. It may not need to be lifelong therapy, but at least for the intermediate term until he is an outpatient I would favor continuing it.  Recommendations: 1) Keppra 500 mg twice a day 2) no further recommendations at this time, please call with further questions or concerns.   Roland Rack, MD [facility] [phone]  If 7pm- 7am, please page neurology on call as listed in [place].
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
## 2017-02-15 NOTE — Progress Notes (Addendum)
Patient ID: Peter [name], male   DOB: June 20, 1961, 56 y.o.   MRN: [id number] Follow up - Trauma Critical Care  Patient Details:    Peter [name] is an 56 y.o. male.  Lines/tubes : Airway 8 mm (Active)  Secured at (cm) 24 cm 02/15/2017  3:20 AM  Measured From Lips 02/15/2017  3:20 AM  Secured Location Center 02/15/2017  3:20 AM  Secured By [employer] 02/15/2017  3:20 AM  Tube Holder Repositioned Yes 02/15/2017  3:20 AM  Cuff Pressure (cm H2O) 26 cm H2O 02/14/2017  7:39 PM  Site Condition Dry 02/15/2017  3:20 AM     Arterial Line 02/13/17 Radial (Active)  Site Assessment Clean;Dry;Intact 02/14/2017  8:00 PM  Line Status Pulsatile blood flow 02/14/2017  8:00 PM  Art Line Waveform Appropriate 02/14/2017  8:00 PM  Art Line Interventions Zeroed and calibrated 02/14/2017  8:00 PM  Color/Movement/Sensation Capillary refill less than 3 sec 02/14/2017  8:00 PM  Dressing Type Transparent;Occlusive 02/14/2017  8:00 PM  Dressing Status Clean;Dry;Intact;Antimicrobial disc in place 02/14/2017  8:00 PM  Dressing Change Due 02/21/17 02/14/2017  8:00 PM     Chest Tube 1 Left Pleural 14 Fr. (Active)  Suction To water seal 02/15/2017  4:00 AM  Chest Tube Air Leak None 02/15/2017  4:00 AM  Patency Intervention Tip/tilt 02/14/2017  8:00 PM  Drainage Description Serosanguineous 02/15/2017  4:00 AM  Dressing Status Clean;Dry;Intact 02/15/2017  4:00 AM  Dressing Intervention Other (Comment) 02/14/2017  8:00 PM  Site Assessment Clean;Dry;Intact 02/15/2017  4:00 AM  Surrounding Skin Unable to view 02/15/2017  4:00 AM  Output (mL) 80 mL 02/15/2017  6:25 AM     Negative Pressure Wound Therapy Abdomen Anterior (Active)  Site / Wound Assessment Clean;Dry 02/15/2017  4:00 AM  Peri-wound Assessment Intact 02/15/2017  4:00 AM  Wound filler - Black foam 1 02/14/2017  1:00 PM  Cycle Continuous;On 02/15/2017  4:00 AM  Target Pressure (mmHg) 125 02/15/2017  4:00 AM  Dressing Status Intact 02/15/2017  4:00 AM  Output (mL)  0 mL 02/15/2017  6:25 AM     NG/OG Tube Orogastric 16 Fr. Right mouth Xray (Active)  Site Assessment Clean;Dry;Intact 02/15/2017  4:00 AM  Ongoing Placement Verification No change in cm markings or external length of tube from initial placement;No change in respiratory status;No acute changes, not attributed to clinical condition 02/15/2017  4:00 AM  Status Infusing tube feed 02/15/2017  4:00 AM  Drainage Appearance Green 02/08/2017 10:00 AM  Intake (mL) 30 mL 02/13/2017 10:00 PM  Output (mL) 150 mL 02/13/2017 10:00 PM     Urethral Catheter [name], EMT Double-lumen 16 Fr. (Active)  Indication for Insertion or Continuance of Catheter Unstable spinal/crush injuries 02/15/2017  4:00 AM  Site Assessment Clean;Intact 02/15/2017  4:00 AM  Catheter Maintenance Bag below level of bladder;Catheter secured;Drainage bag/tubing not touching floor;Insertion date on drainage bag;No dependent loops;Seal intact;Bag emptied prior to transport 02/15/2017  4:00 AM  Collection Container Standard drainage bag 02/15/2017  4:00 AM  Securement Method Securing device (Describe) 02/15/2017  4:00 AM  Urinary Catheter Interventions Unclamped 02/15/2017  4:00 AM  Output (mL) 100 mL 02/15/2017  6:25 AM    Microbiology/Sepsis markers: Results for orders placed or performed during the hospital encounter of 02/07/17  MRSA PCR Screening     Status: None   Collection Time: 02/08/17  4:08 AM  Result Value Ref Range Status   MRSA by PCR NEGATIVE NEGATIVE Final    Comment:  The GeneXpert MRSA Assay (FDA approved for NASAL specimens only), is one component of a comprehensive MRSA colonization surveillance program. It is not intended to diagnose MRSA infection nor to guide or monitor treatment for MRSA infections.   Culture, respiratory (NON-Expectorated)     Status: None   Collection Time: 02/10/17 10:59 AM  Result Value Ref Range Status   Specimen Description TRACHEAL ASPIRATE  Final   Special Requests Normal  Final    Gram Stain   Final    ABUNDANT WBC PRESENT, PREDOMINANTLY PMN RARE SQUAMOUS EPITHELIAL CELLS PRESENT ABUNDANT GRAM NEGATIVE COCCI IN PAIRS ABUNDANT GRAM NEGATIVE RODS MODERATE GRAM POSITIVE COCCI IN CHAINS    Culture ABUNDANT Consistent with normal respiratory flora.  Final   Report Status 02/12/2017 FINAL  Final  Culture, blood (Routine X 2) w Reflex to ID Panel     Status: None (Preliminary result)   Collection Time: 02/11/17  3:25 PM  Result Value Ref Range Status   Specimen Description BLOOD RIGHT HAND  Final   Special Requests   Final    BOTTLES DRAWN AEROBIC ONLY Blood Culture adequate volume   Culture  Setup Time   Final    GRAM NEGATIVE RODS AEROBIC BOTTLE ONLY CRITICAL VALUE NOTED.  VALUE IS CONSISTENT WITH PREVIOUSLY REPORTED AND CALLED VALUE.    Culture GRAM NEGATIVE RODS  Final   Report Status PENDING  Incomplete  Culture, blood (Routine X 2) w Reflex to ID Panel     Status: None (Preliminary result)   Collection Time: 02/11/17  3:25 PM  Result Value Ref Range Status   Specimen Description BLOOD RIGHT HAND  Final   Special Requests   Final    BOTTLES DRAWN AEROBIC ONLY Blood Culture adequate volume   Culture  Setup Time   Final    GRAM NEGATIVE RODS AEROBIC BOTTLE ONLY CRITICAL RESULT CALLED TO, READ BACK BY AND VERIFIED WITH: J.LEDFORD, PHARMD 02/15/17 0632 L.CHAMPION    Culture GRAM NEGATIVE RODS  Final   Report Status PENDING  Incomplete  Blood Culture ID Panel (Reflexed)     Status: Abnormal   Collection Time: 02/11/17  3:25 PM  Result Value Ref Range Status   Enterococcus species NOT DETECTED NOT DETECTED Final   Listeria monocytogenes NOT DETECTED NOT DETECTED Final   Staphylococcus species NOT DETECTED NOT DETECTED Final   Staphylococcus aureus NOT DETECTED NOT DETECTED Final   Streptococcus species NOT DETECTED NOT DETECTED Final   Streptococcus agalactiae NOT DETECTED NOT DETECTED Final   Streptococcus pneumoniae NOT DETECTED NOT DETECTED Final    Streptococcus pyogenes NOT DETECTED NOT DETECTED Final   Acinetobacter baumannii NOT DETECTED NOT DETECTED Final   Enterobacteriaceae species DETECTED (A) NOT DETECTED Final    Comment: Enterobacteriaceae represent a large family of gram-negative bacteria, not a single organism. CRITICAL RESULT CALLED TO, READ BACK BY AND VERIFIED WITH: J.LEDFORD, PHARMD 02/15/17 0632 L.CHAMPION    Enterobacter cloacae complex NOT DETECTED NOT DETECTED Final   Escherichia coli NOT DETECTED NOT DETECTED Final   Klebsiella oxytoca DETECTED (A) NOT DETECTED Final    Comment: CRITICAL RESULT CALLED TO, READ BACK BY AND VERIFIED WITH: J.LEDFORD, PHARMD 02/15/17 0632 L.CHAMPION    Klebsiella pneumoniae NOT DETECTED NOT DETECTED Final   Proteus species NOT DETECTED NOT DETECTED Final   Serratia marcescens NOT DETECTED NOT DETECTED Final   Carbapenem resistance NOT DETECTED NOT DETECTED Final   Haemophilus influenzae NOT DETECTED NOT DETECTED Final   Neisseria meningitidis NOT DETECTED NOT DETECTED Final  Pseudomonas aeruginosa NOT DETECTED NOT DETECTED Final   Candida albicans NOT DETECTED NOT DETECTED Final   Candida glabrata NOT DETECTED NOT DETECTED Final   Candida krusei NOT DETECTED NOT DETECTED Final   Candida parapsilosis NOT DETECTED NOT DETECTED Final   Candida tropicalis NOT DETECTED NOT DETECTED Final  Surgical PCR screen     Status: None   Collection Time: 02/13/17  6:30 AM  Result Value Ref Range Status   MRSA, PCR NEGATIVE NEGATIVE Final   Staphylococcus aureus NEGATIVE NEGATIVE Final    Comment:        The Xpert SA Assay (FDA approved for NASAL specimens in patients over 17 years of age), is one component of a comprehensive surveillance program.  Test performance has been validated by [facility] for patients greater than or equal to 82 year old. It is not intended to diagnose infection nor to guide or monitor treatment.     Anti-infectives:  Anti-infectives    Start      Dose/Rate Route Frequency Ordered Stop   02/14/17 1500  piperacillin-tazobactam (ZOSYN) IVPB 3.375 g     3.375 g 12.5 mL/hr over 240 Minutes Intravenous Every 8 hours 02/14/17 1351     02/11/17 1000  piperacillin-tazobactam (ZOSYN) IVPB 3.375 g  Status:  Discontinued     3.375 g 12.5 mL/hr over 240 Minutes Intravenous Every 8 hours 02/11/17 0858 02/14/17 1351   02/11/17 1000  vancomycin (VANCOCIN) IVPB 750 mg/150 ml premix  Status:  Discontinued     750 mg 150 mL/hr over 60 Minutes Intravenous Every 12 hours 02/11/17 0858 02/14/17 0844   02/07/17 2000  ceFAZolin (ANCEF) IVPB 1 g/50 mL premix     1 g 100 mL/hr over 30 Minutes Intravenous Every 8 hours 02/07/17 1416 02/08/17 1543      Best Practice/Protocols:  VTE Prophylaxis: Lovenox (prophylaxtic dose) Continous Sedation  Consults: Treatment Team:  Altamese Russell, MD    Studies:    Events:  Subjective:    Overnight Issues:   Objective:  Vital signs for last 24 hours: Temp:  [97.7 F (36.5 C)-99.9 F (37.7 C)] 99.7 F (37.6 C) (08/29 0700) Pulse Rate:  [42-114] 72 (08/29 0700) Resp:  [20-29] 20 (08/29 0700) BP: (91-164)/(55-90) 101/58 (08/29 0700) SpO2:  [94 %-100 %] 96 % (08/29 0700) Arterial Line BP: (82-132)/(53-110) 108/55 (08/29 0700) FiO2 (%):  [40 %-100 %] 40 % (08/29 0320) Weight:  [104.7 kg (230 lb 13.2 oz)] 104.7 kg (230 lb 13.2 oz) (08/29 0433)  Hemodynamic parameters for last 24 hours:    Intake/Output from previous day: 08/28 0701 - 08/29 0700 In: 4376.4 [I.V.:2866.4; NG/GT:1460; IV Piggyback:50] Out: 7035 [Urine:1635; Chest Tube:210]  Intake/Output this shift: No intake/output data recorded.  Vent settings for last 24 hours: Vent Mode: PRVC FiO2 (%):  [40 %-100 %] 40 % Set Rate:  [20 bmp] 20 bmp Vt Set:  [580 mL] 580 mL PEEP:  [5 cmH20] 5 cmH20 Plateau Pressure:  [18 [id number] cmH20] 25 cmH20  Physical Exam:  General: on vent Neuro: arouses and follows some commands HEENT/Neck: ETT and  collar Resp: clear to auscultation bilaterally CVS: RRR occ ectopy GI: soft, nontender, BS WNL, no r/g Skin: no rash Extremities: edema 3+ Skin - jaundice Results for orders placed or performed during the hospital encounter of 02/07/17 (from the past 24 hour(s))  Glucose, capillary     Status: Abnormal   Collection Time: 02/14/17  8:53 AM  Result Value Ref Range   Glucose-Capillary 175 (H)  65 - 99 mg/dL   Comment 1 Notify RN    Comment 2 Document in Chart   Glucose, capillary     Status: Abnormal   Collection Time: 02/14/17 11:40 AM  Result Value Ref Range   Glucose-Capillary 189 (H) 65 - 99 mg/dL  Glucose, capillary     Status: Abnormal   Collection Time: 02/14/17  3:43 PM  Result Value Ref Range   Glucose-Capillary 134 (H) 65 - 99 mg/dL   Comment 1 Notify RN    Comment 2 Document in Chart   Glucose, capillary     Status: Abnormal   Collection Time: 02/14/17  8:11 PM  Result Value Ref Range   Glucose-Capillary 170 (H) 65 - 99 mg/dL  Glucose, capillary     Status: Abnormal   Collection Time: 02/15/17 12:13 AM  Result Value Ref Range   Glucose-Capillary 182 (H) 65 - 99 mg/dL  Glucose, capillary     Status: Abnormal   Collection Time: 02/15/17  4:01 AM  Result Value Ref Range   Glucose-Capillary 205 (H) 65 - 99 mg/dL  CBC     Status: Abnormal   Collection Time: 02/15/17  4:30 AM  Result Value Ref Range   WBC 9.8 4.0 - 10.5 K/uL   RBC 3.37 (L) 4.22 - 5.81 MIL/uL   Hemoglobin 10.0 (L) 13.0 - 17.0 g/dL   HCT 29.7 (L) 39.0 - 52.0 %   MCV 88.1 78.0 - 100.0 fL   MCH 29.7 26.0 - 34.0 pg   MCHC 33.7 30.0 - 36.0 g/dL   RDW 16.3 (H) 11.5 - 15.5 %   Platelets 114 (L) 150 - 400 K/uL  Basic metabolic panel     Status: Abnormal   Collection Time: 02/15/17  4:30 AM  Result Value Ref Range   Sodium 148 (H) 135 - 145 mmol/L   Potassium 4.0 3.5 - 5.1 mmol/L   Chloride 123 (H) 101 - 111 mmol/L   CO2 19 (L) 22 - 32 mmol/L   Glucose, Bld 222 (H) 65 - 99 mg/dL   BUN 62 (H) 6 - 20  mg/dL   Creatinine, Ser 1.40 (H) 0.61 - 1.24 mg/dL   Calcium 7.8 (L) 8.9 - 10.3 mg/dL   GFR calc non Af Amer 55 (L) >60 mL/min   GFR calc Af Amer >60 >60 mL/min   Anion gap 6 5 - 15  Ammonia     Status: Abnormal   Collection Time: 02/15/17  4:30 AM  Result Value Ref Range   Ammonia 50 (H) 9 - 35 umol/L    Assessment & Plan: Present on Admission: . Multiple fractures of ribs, bilateral, initial encounter for closed fracture . Multiple closed anterior-posterior compression fractures of pelvis (HCC)    LOS: 8 days   Additional comments:I reviewed the patient's new clinical lab test results. Marland Kitchen PHB chicken semi L 2-9 and R 3-8 rib FX with occult R PTX and L HPTX- L CT to H2O seal, continue until output < 100cc/24h Grade 3 spleen lac with small extrav- S/P Angioembolization by Dr. Annamaria Boots 8/21 shock- recurrent 8/25 am,  S/P L internal iliac artery angioembolization by Dr. Annamaria Boots 8/21 and splenic angioembolization 8/21; L gaze/SZ and neuro W/U - for MR head and C spine today, Appreciate Dr. 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  Handy 8/21 , S/P symphysis plating by Dr. Marcelino Scot 8/27 Vent dependent acute hypoxic resp failure- cont vent support, PEEP 5, weaning as tolerated FEN- increase free water for hypernatremia, replete hypocalcemia, lasix X 1 IDDM- lantus 10u QHS, ICU glycemic control protocol ID- Zosyn, blood CX Klebsiella with sens P Renal - AKI improving VTE- PAS, Lovenox Elevated LFTs - direct hyperbilirubinemia from resorbtion of hematomas, follow up NH4 50, lactulose X 1 and labs tomorrow Septic shock- likely pulmonary, improving with vanc/zosyn, follow CXs, off pressors Dispo- ICU, I spoke with family at the bedside.  Critical Care Total Time*: 39 Minutes  Georganna Skeans, MD, MPH, FACS Trauma:  [phone] General Surgery: [phone]  02/15/2017  *Care during the described time interval was provided by me. I have reviewed this patient's available data, including medical history, events of note, physical examination and test results as part of my evaluation.

## 2017-02-16 ENCOUNTER — Encounter (HOSPITAL_COMMUNITY): Payer: Self-pay | Admitting: Orthopedic Surgery

## 2017-02-16 LAB — HEPATIC FUNCTION PANEL
ALK PHOS: 186 U/L — AB (ref 38–126)
ALT: 432 U/L — AB (ref 17–63)
ALT: 243 U/L — AB (ref 17–63)
AST: 469 U/L — AB (ref 15–41)
AST: 76 U/L — ABNORMAL HIGH (ref 15–41)
Albumin: 1.9 g/dL — ABNORMAL LOW (ref 3.5–5.0)
Albumin: 1.5 g/dL — ABNORMAL LOW (ref 3.5–5.0)
Alkaline Phosphatase: 201 U/L — ABNORMAL HIGH (ref 38–126)
BILIRUBIN DIRECT: 18.3 mg/dL — AB (ref 0.1–0.5)
BILIRUBIN DIRECT: 12.5 mg/dL — AB (ref 0.1–0.5)
BILIRUBIN INDIRECT: 4.7 mg/dL — AB (ref 0.3–0.9)
BILIRUBIN TOTAL: 17.1 mg/dL — AB (ref 0.3–1.2)
Total Bilirubin: 17.2 mg/dL — ABNORMAL HIGH (ref 0.3–1.2)
Total Protein: 4.6 g/dL — ABNORMAL LOW (ref 6.5–8.1)
Total Protein: 4.3 g/dL — ABNORMAL LOW (ref 6.5–8.1)

## 2017-02-16 LAB — CBC
HCT: 28.2 % — ABNORMAL LOW (ref 39.0–52.0)
HEMOGLOBIN: 9.6 g/dL — AB (ref 13.0–17.0)
MCH: 30.6 pg (ref 26.0–34.0)
MCHC: 34 g/dL (ref 30.0–36.0)
MCV: 89.8 fL (ref 78.0–100.0)
Platelets: 172 10*3/uL (ref 150–400)
RBC: 3.14 MIL/uL — AB (ref 4.22–5.81)
RDW: 16.1 % — ABNORMAL HIGH (ref 11.5–15.5)
WBC: 9.8 10*3/uL (ref 4.0–10.5)

## 2017-02-16 LAB — BASIC METABOLIC PANEL
Anion gap: 6 (ref 5–15)
BUN: 66 mg/dL — AB (ref 6–20)
CALCIUM: 7.7 mg/dL — AB (ref 8.9–10.3)
CO2: 21 mmol/L — ABNORMAL LOW (ref 22–32)
CREATININE: 1.44 mg/dL — AB (ref 0.61–1.24)
Chloride: 124 mmol/L — ABNORMAL HIGH (ref 101–111)
GFR, EST NON AFRICAN AMERICAN: 53 mL/min — AB (ref >60)
GLUCOSE: 218 mg/dL — AB (ref 65–99)
Potassium: 3.8 mmol/L (ref 3.5–5.1)
SODIUM: 151 mmol/L — AB (ref 135–145)

## 2017-02-16 LAB — GLUCOSE, CAPILLARY
GLUCOSE-CAPILLARY: 178 mg/dL — AB (ref 65–99)
GLUCOSE-CAPILLARY: 205 mg/dL — AB (ref 65–99)
GLUCOSE-CAPILLARY: 210 mg/dL — AB (ref 65–99)
Glucose-Capillary: 180 mg/dL — ABNORMAL HIGH (ref 65–99)
Glucose-Capillary: 204 mg/dL — ABNORMAL HIGH (ref 65–99)
Glucose-Capillary: 213 mg/dL — ABNORMAL HIGH (ref 65–99)

## 2017-02-16 LAB — AMMONIA: Ammonia: 48 umol/L — ABNORMAL HIGH (ref 9–35)

## 2017-02-16 MED ORDER — CLONAZEPAM 1 MG PO TABS
1.0000 mg | ORAL_TABLET | Freq: Two times a day (BID) | ORAL | Status: DC
  Administered 2017-02-16 – 2017-02-20 (×8): 1 mg
  Filled 2017-02-16 (×8): qty 1

## 2017-02-16 MED ORDER — FREE WATER
300.0000 mL | Freq: Four times a day (QID) | Status: DC
  Administered 2017-02-16 – 2017-02-24 (×32): 300 mL

## 2017-02-16 MED ORDER — QUETIAPINE FUMARATE 100 MG PO TABS
100.0000 mg | ORAL_TABLET | Freq: Two times a day (BID) | ORAL | Status: DC
  Administered 2017-02-16 – 2017-02-20 (×8): 100 mg
  Filled 2017-02-16 (×8): qty 1

## 2017-02-16 MED ORDER — LACTULOSE 10 GM/15ML PO SOLN
20.0000 g | Freq: Once | ORAL | Status: AC
  Administered 2017-02-16: 20 g
  Filled 2017-02-16: qty 30

## 2017-02-16 NOTE — Anesthesia Postprocedure Evaluation (Signed)
Anesthesia Post Note  Patient: Peter [name]  Procedure(s) Performed: Procedure(s) (LRB): OPEN REDUCTION INTERNAL FIXATION (ORIF) PELVIC FRACTURE (N/A)     Patient location during evaluation: SICU Anesthesia Type: General Level of consciousness: sedated Pain management: pain level controlled Vital Signs Assessment: post-procedure vital signs reviewed and stable Respiratory status: patient remains intubated per anesthesia plan Cardiovascular status: stable Anesthetic complications: no    Last Vitals:  Vitals:   02/16/17 0600 02/16/17 0700  BP: (!) 99/56 (!) 100/58  Pulse: 78 77  Resp: 20 20  Temp: 37.2 C 37.1 C  SpO2: 98% 97%    Last Pain:  Vitals:   02/16/17 0400  TempSrc: Core (Comment)  PainSc:                  [place]
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
## 2017-02-17 ENCOUNTER — Inpatient Hospital Stay (HOSPITAL_COMMUNITY): Payer: BLUE CROSS/BLUE SHIELD

## 2017-02-17 LAB — CBC
HCT: 29.8 % — ABNORMAL LOW (ref 39.0–52.0)
Hemoglobin: 9.8 g/dL — ABNORMAL LOW (ref 13.0–17.0)
MCH: 29.6 pg (ref 26.0–34.0)
MCHC: 32.9 g/dL (ref 30.0–36.0)
MCV: 90 fL (ref 78.0–100.0)
Platelets: 244 10*3/uL (ref 150–400)
RBC: 3.31 MIL/uL — ABNORMAL LOW (ref 4.22–5.81)
RDW: 16.3 % — ABNORMAL HIGH (ref 11.5–15.5)
WBC: 7.4 10*3/uL (ref 4.0–10.5)

## 2017-02-17 LAB — GLUCOSE, CAPILLARY
GLUCOSE-CAPILLARY: 197 mg/dL — AB (ref 65–99)
Glucose-Capillary: 175 mg/dL — ABNORMAL HIGH (ref 65–99)
Glucose-Capillary: 183 mg/dL — ABNORMAL HIGH (ref 65–99)
Glucose-Capillary: 174 mg/dL — ABNORMAL HIGH (ref 65–99)
Glucose-Capillary: 212 mg/dL — ABNORMAL HIGH (ref 65–99)

## 2017-02-17 LAB — CULTURE, BLOOD (ROUTINE X 2)
SPECIAL REQUESTS: ADEQUATE
SPECIAL REQUESTS: ADEQUATE

## 2017-02-17 LAB — BPAM RBC
Blood Product Expiration Date: 201809102359
Blood Product Expiration Date: 201809122359
ISSUE DATE / TIME: 201808271554
ISSUE DATE / TIME: 201808271731
UNIT TYPE AND RH: 8400
Unit Type and Rh: 8400

## 2017-02-17 LAB — COMPREHENSIVE METABOLIC PANEL WITH GFR
ALT: 162 U/L — ABNORMAL HIGH (ref 17–63)
AST: 36 U/L (ref 15–41)
Albumin: 1.4 g/dL — ABNORMAL LOW (ref 3.5–5.0)
Alkaline Phosphatase: 198 U/L — ABNORMAL HIGH (ref 38–126)
Anion gap: 6 (ref 5–15)
BUN: 54 mg/dL — ABNORMAL HIGH (ref 6–20)
CO2: 23 mmol/L (ref 22–32)
Calcium: 7.9 mg/dL — ABNORMAL LOW (ref 8.9–10.3)
Chloride: 123 mmol/L — ABNORMAL HIGH (ref 101–111)
Creatinine, Ser: 1.44 mg/dL — ABNORMAL HIGH (ref 0.61–1.24)
GFR calc Af Amer: >60 mL/min
GFR calc non Af Amer: 53 mL/min — ABNORMAL LOW
Glucose, Bld: 201 mg/dL — ABNORMAL HIGH (ref 65–99)
Potassium: 3.8 mmol/L (ref 3.5–5.1)
Sodium: 152 mmol/L — ABNORMAL HIGH (ref 135–145)
Total Bilirubin: 17.3 mg/dL — ABNORMAL HIGH (ref 0.3–1.2)
Total Protein: 4.6 g/dL — ABNORMAL LOW (ref 6.5–8.1)

## 2017-02-17 LAB — TYPE AND SCREEN
ABO/RH(D): AB POS
ANTIBODY SCREEN: NEGATIVE
UNIT DIVISION: 0
Unit division: 0

## 2017-02-17 MED ORDER — CHLORHEXIDINE GLUCONATE 0.12 % MT SOLN
OROMUCOSAL | Status: AC
  Filled 2017-02-17: qty 15

## 2017-02-17 MED ORDER — GUAIFENESIN 100 MG/5ML PO SOLN
15.0000 mL | Freq: Four times a day (QID) | ORAL | Status: DC
  Administered 2017-02-17 – 2017-03-23 (×136): 300 mg
  Filled 2017-02-17: qty 20
  Filled 2017-02-17: qty 10
  Filled 2017-02-17 (×3): qty 20
  Filled 2017-02-17 (×2): qty 10
  Filled 2017-02-17 (×4): qty 20
  Filled 2017-02-17: qty 30
  Filled 2017-02-17 (×2): qty 20
  Filled 2017-02-17: qty 30
  Filled 2017-02-17: qty 10
  Filled 2017-02-17: qty 20
  Filled 2017-02-17: qty 15
  Filled 2017-02-17: qty 10
  Filled 2017-02-17: qty 20
  Filled 2017-02-17: qty 15
  Filled 2017-02-17 (×4): qty 20
  Filled 2017-02-17: qty 10
  Filled 2017-02-17: qty 20
  Filled 2017-02-17: qty 30
  Filled 2017-02-17 (×3): qty 20
  Filled 2017-02-17: qty 10
  Filled 2017-02-17 (×2): qty 20
  Filled 2017-02-17: qty 30
  Filled 2017-02-17: qty 20
  Filled 2017-02-17: qty 10
  Filled 2017-02-17: qty 30
  Filled 2017-02-17: qty 15
  Filled 2017-02-17: qty 10
  Filled 2017-02-17 (×6): qty 20
  Filled 2017-02-17: qty 10
  Filled 2017-02-17 (×3): qty 20
  Filled 2017-02-17: qty 10
  Filled 2017-02-17 (×3): qty 20
  Filled 2017-02-17: qty 30
  Filled 2017-02-17: qty 15
  Filled 2017-02-17: qty 30
  Filled 2017-02-17 (×16): qty 20
  Filled 2017-02-17: qty 10
  Filled 2017-02-17: qty 30
  Filled 2017-02-17 (×9): qty 20
  Filled 2017-02-17: qty 30
  Filled 2017-02-17 (×2): qty 20
  Filled 2017-02-17: qty 30
  Filled 2017-02-17: qty 10
  Filled 2017-02-17 (×3): qty 20
  Filled 2017-02-17: qty 15
  Filled 2017-02-17: qty 5
  Filled 2017-02-17 (×4): qty 20
  Filled 2017-02-17: qty 15
  Filled 2017-02-17 (×2): qty 20
  Filled 2017-02-17: qty 30
  Filled 2017-02-17 (×8): qty 20
  Filled 2017-02-17: qty 10
  Filled 2017-02-17: qty 20
  Filled 2017-02-17: qty 10
  Filled 2017-02-17: qty 20
  Filled 2017-02-17: qty 5
  Filled 2017-02-17 (×2): qty 10
  Filled 2017-02-17 (×3): qty 20
  Filled 2017-02-17: qty 10
  Filled 2017-02-17 (×8): qty 20
  Filled 2017-02-17: qty 30
  Filled 2017-02-17 (×2): qty 20
  Filled 2017-02-17 (×2): qty 10
  Filled 2017-02-17 (×3): qty 30
  Filled 2017-02-17: qty 20

## 2017-02-17 MED ORDER — SODIUM CHLORIDE 0.45 % IV SOLN
INTRAVENOUS | Status: DC
  Administered 2017-02-17 – 2017-02-22 (×7): via INTRAVENOUS
  Filled 2017-02-17 (×11): qty 1000

## 2017-02-17 MED ORDER — DEXTROSE 5 % IV SOLN
2.0000 g | INTRAVENOUS | Status: DC
  Administered 2017-02-17 – 2017-02-18 (×2): 2 g via INTRAVENOUS
  Filled 2017-02-17 (×3): qty 2
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
## 2017-02-17 NOTE — Progress Notes (Signed)
Inpatient Diabetes Program Recommendations  AACE/ADA: New Consensus Statement on Inpatient Glycemic Control (2015)  Target Ranges:  Prepandial:   less than 140 mg/dL      Peak postprandial:   less than 180 mg/dL (1-2 hours)      Critically ill patients:  140 - 180 mg/dL   Results for Peter [name], Peter [name] (MRN [id number]) as of 02/17/2017 12:00  Ref. Range 02/15/2017 23:49 02/16/2017 04:06 02/16/2017 09:11 02/16/2017 11:40 02/16/2017 16:01 02/16/2017 20:08  Glucose-Capillary Latest Ref Range: 65 - 99 mg/dL 175 (H) 204 (H) 180 (H) 178 (H) 205 (H) 210 (H)   Results for Peter [name], Peter [name] (MRN [id number]) as of 02/17/2017 12:00  Ref. Range 02/16/2017 23:53 02/17/2017 04:15 02/17/2017 08:05 02/17/2017 11:37  Glucose-Capillary Latest Ref Range: 65 - 99 mg/dL 213 (H) 175 (H) 183 (H) 174 (H)    Home DM Meds: Metformin 1000 mg BID       Amaryl 4 mg daily  Current Insulin Orders: Lantus 10 units QHS      Novolog Resistant Correction Scale/ SSI (0-20 units) Q4 hours      MD- Please consider the following in-hospital insulin adjustments:  1. Increase Lantus slightly to 15 units QHS  2. Start low dose Novolog Tube Feed Coverage: Novolog 3 units Q4 hours (hold if tube feeds held for any reason)     --Will follow patient during hospitalization--  Wyn Quaker RN, MSN, CDE Diabetes Coordinator Inpatient Glycemic Control Team Team Pager: [phone] (8a-5p)
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
## 2017-02-17 NOTE — Progress Notes (Signed)
Orthopedic Tech Progress Note Patient Details:  Peter [name] 1960/09/19 [id number]  Ortho Devices Type of Ortho Device: Abduction pillow Ortho Device/Splint Location: heel boot Ortho Device/Splint Interventions: Application   [name] 02/17/2017, 4:49 PM

## 2017-02-18 ENCOUNTER — Inpatient Hospital Stay (HOSPITAL_COMMUNITY): Payer: BLUE CROSS/BLUE SHIELD

## 2017-02-18 LAB — BASIC METABOLIC PANEL
Anion gap: 8 (ref 5–15)
BUN: 52 mg/dL — AB (ref 6–20)
CHLORIDE: 122 mmol/L — AB (ref 101–111)
CO2: 22 mmol/L (ref 22–32)
CREATININE: 1.42 mg/dL — AB (ref 0.61–1.24)
Calcium: 8.1 mg/dL — ABNORMAL LOW (ref 8.9–10.3)
GFR calc Af Amer: >60 mL/min (ref >60)
GFR calc non Af Amer: 54 mL/min — ABNORMAL LOW (ref >60)
GLUCOSE: 252 mg/dL — AB (ref 65–99)
Potassium: 4.3 mmol/L (ref 3.5–5.1)
SODIUM: 152 mmol/L — AB (ref 135–145)

## 2017-02-18 LAB — CBC
HEMATOCRIT: 29.3 % — AB (ref 39.0–52.0)
HEMOGLOBIN: 9.6 g/dL — AB (ref 13.0–17.0)
MCH: 29.2 pg (ref 26.0–34.0)
MCHC: 32.8 g/dL (ref 30.0–36.0)
MCV: 89.1 fL (ref 78.0–100.0)
Platelets: 346 10*3/uL (ref 150–400)
RBC: 3.29 MIL/uL — AB (ref 4.22–5.81)
RDW: 16.6 % — ABNORMAL HIGH (ref 11.5–15.5)
WBC: 9.4 10*3/uL (ref 4.0–10.5)

## 2017-02-18 LAB — GLUCOSE, CAPILLARY
GLUCOSE-CAPILLARY: 235 mg/dL — AB (ref 65–99)
GLUCOSE-CAPILLARY: 213 mg/dL — AB (ref 65–99)
GLUCOSE-CAPILLARY: 178 mg/dL — AB (ref 65–99)
GLUCOSE-CAPILLARY: 224 mg/dL — AB (ref 65–99)
Glucose-Capillary: 193 mg/dL — ABNORMAL HIGH (ref 65–99)
Glucose-Capillary: 205 mg/dL — ABNORMAL HIGH (ref 65–99)

## 2017-02-18 LAB — AMMONIA: Ammonia: 37 umol/L — ABNORMAL HIGH (ref 9–35)

## 2017-02-18 MED ORDER — METOPROLOL TARTRATE 25 MG/10 ML ORAL SUSPENSION
25.0000 mg | Freq: Two times a day (BID) | ORAL | Status: DC
  Administered 2017-02-18 – 2017-02-24 (×13): 25 mg
  Filled 2017-02-18 (×14): qty 10
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
## 2017-02-18 NOTE — Progress Notes (Signed)
Follow up - Trauma Critical Care  Patient Details:    Peter [name] is an 56 y.o. male.  Lines/tubes : Airway 8 mm (Active)  Secured at (cm) 24 cm 02/18/2017 11:56 AM  Measured From Lips 02/18/2017 11:56 AM  Secured Location Right 02/18/2017 11:56 AM  Secured By [employer] 02/18/2017 11:56 AM  Tube Holder Repositioned Yes 02/18/2017 11:56 AM  Cuff Pressure (cm H2O) 28 cm H2O 02/18/2017  7:54 AM  Site Condition Dry 02/18/2017 11:56 AM     Chest Tube 1 Left Pleural 14 Fr. (Active)  Suction To water seal 02/18/2017  8:00 AM  Chest Tube Air Leak None 02/18/2017  8:00 AM  Patency Intervention Milked;Tip/tilt 02/16/2017  8:00 PM  Drainage Description Serous;Yellow 02/18/2017  8:00 AM  Dressing Status Clean;Dry;Intact 02/18/2017  8:00 AM  Dressing Intervention New dressing 02/17/2017  4:00 AM  Site Assessment Clean;Dry;Intact 02/18/2017  5:00 AM  Surrounding Skin Unable to view 02/18/2017  5:00 AM  Output (mL) 100 mL 02/18/2017  6:00 AM     NG/OG Tube Orogastric 16 Fr. Right mouth Xray (Active)  Cm Marking at Nare/Corner of Mouth (if applicable) 78 cm 02/23/7590  9:25 AM  Site Assessment Clean;Dry;Intact 02/18/2017  8:00 AM  Ongoing Placement Verification No change in respiratory status;Other (Comment) 02/18/2017  9:25 AM  Status Retaped 02/18/2017  9:25 AM  Drainage Appearance Green 02/18/2017  5:00 AM  Intake (mL) 60 mL 02/18/2017  6:00 AM  Output (mL) 150 mL 02/13/2017 10:00 PM     Urethral Catheter [name], EMT Double-lumen 16 Fr. (Active)  Indication for Insertion or Continuance of Catheter Acute urinary retention 02/18/2017  8:00 AM  Site Assessment Clean;Intact 02/18/2017  8:00 AM  Catheter Maintenance Bag below level of bladder;Catheter secured;Drainage bag/tubing not touching floor;Insertion date on drainage bag;No dependent loops;Seal intact;Bag emptied prior to transport 02/18/2017  8:00 AM  Collection Container Standard drainage bag 02/18/2017  8:00 AM  Securement Method Securing device (Describe) 02/18/2017   8:00 AM  Urinary Catheter Interventions Unclamped 02/18/2017  8:00 AM  Output (mL) 220 mL 02/18/2017 11:00 AM    Microbiology/Sepsis markers: Results for orders placed or performed during the hospital encounter of 02/07/17  MRSA PCR Screening     Status: None   Collection Time: 02/08/17  4:08 AM  Result Value Ref Range Status   MRSA by PCR NEGATIVE NEGATIVE Final    Comment:        The GeneXpert MRSA Assay (FDA approved for NASAL specimens only), is one component of a comprehensive MRSA colonization surveillance program. It is not intended to diagnose MRSA infection nor to guide or monitor treatment for MRSA infections.   Culture, respiratory (NON-Expectorated)     Status: None   Collection Time: 02/10/17 10:59 AM  Result Value Ref Range Status   Specimen Description TRACHEAL ASPIRATE  Final   Special Requests Normal  Final   Gram Stain   Final    ABUNDANT WBC PRESENT, PREDOMINANTLY PMN RARE SQUAMOUS EPITHELIAL CELLS PRESENT ABUNDANT GRAM NEGATIVE COCCI IN PAIRS ABUNDANT GRAM NEGATIVE RODS MODERATE GRAM POSITIVE COCCI IN CHAINS    Culture ABUNDANT Consistent with normal respiratory flora.  Final   Report Status 02/12/2017 FINAL  Final  Culture, blood (Routine X 2) w Reflex to ID Panel     Status: Abnormal   Collection Time: 02/11/17  3:25 PM  Result Value Ref Range Status   Specimen Description BLOOD RIGHT HAND  Final   Special Requests   Final  BOTTLES DRAWN AEROBIC ONLY Blood Culture adequate volume   Culture  Setup Time   Final    GRAM NEGATIVE RODS AEROBIC BOTTLE ONLY CRITICAL VALUE NOTED.  VALUE IS CONSISTENT WITH PREVIOUSLY REPORTED AND CALLED VALUE.    Culture (A)  Final    KLEBSIELLA OXYTOCA SUSCEPTIBILITIES PERFORMED ON PREVIOUS CULTURE WITHIN THE LAST 5 DAYS.    Report Status 02/17/2017 FINAL  Final  Culture, blood (Routine X 2) w Reflex to ID Panel     Status: Abnormal   Collection Time: 02/11/17  3:25 PM  Result Value Ref Range Status   Specimen  Description BLOOD RIGHT HAND  Final   Special Requests   Final    BOTTLES DRAWN AEROBIC ONLY Blood Culture adequate volume   Culture  Setup Time   Final    GRAM NEGATIVE RODS AEROBIC BOTTLE ONLY CRITICAL RESULT CALLED TO, READ BACK BY AND VERIFIED WITH: J.LEDFORD, PHARMD 02/15/17 0632 L.CHAMPION    Culture KLEBSIELLA OXYTOCA (A)  Final   Report Status 02/17/2017 FINAL  Final   Organism ID, Bacteria KLEBSIELLA OXYTOCA  Final      Susceptibility   Klebsiella oxytoca - MIC*    AMPICILLIN >=32 RESISTANT Resistant     CEFAZOLIN >=64 RESISTANT Resistant     CEFEPIME <=1 SENSITIVE Sensitive     CEFTAZIDIME <=1 SENSITIVE Sensitive     CEFTRIAXONE <=1 SENSITIVE Sensitive     CIPROFLOXACIN <=0.25 SENSITIVE Sensitive     GENTAMICIN <=1 SENSITIVE Sensitive     IMIPENEM <=0.25 SENSITIVE Sensitive     TRIMETH/SULFA <=20 SENSITIVE Sensitive     AMPICILLIN/SULBACTAM 8 SENSITIVE Sensitive     PIP/TAZO <=4 SENSITIVE Sensitive     Extended ESBL NEGATIVE Sensitive     * KLEBSIELLA OXYTOCA  Blood Culture ID Panel (Reflexed)     Status: Abnormal   Collection Time: 02/11/17  3:25 PM  Result Value Ref Range Status   Enterococcus species NOT DETECTED NOT DETECTED Final   Listeria monocytogenes NOT DETECTED NOT DETECTED Final   Staphylococcus species NOT DETECTED NOT DETECTED Final   Staphylococcus aureus NOT DETECTED NOT DETECTED Final   Streptococcus species NOT DETECTED NOT DETECTED Final   Streptococcus agalactiae NOT DETECTED NOT DETECTED Final   Streptococcus pneumoniae NOT DETECTED NOT DETECTED Final   Streptococcus pyogenes NOT DETECTED NOT DETECTED Final   Acinetobacter baumannii NOT DETECTED NOT DETECTED Final   Enterobacteriaceae species DETECTED (A) NOT DETECTED Final    Comment: Enterobacteriaceae represent a large family of gram-negative bacteria, not a single organism. CRITICAL RESULT CALLED TO, READ BACK BY AND VERIFIED WITH: J.LEDFORD, PHARMD 02/15/17 0632 L.CHAMPION    Enterobacter  cloacae complex NOT DETECTED NOT DETECTED Final   Escherichia coli NOT DETECTED NOT DETECTED Final   Klebsiella oxytoca DETECTED (A) NOT DETECTED Final    Comment: CRITICAL RESULT CALLED TO, READ BACK BY AND VERIFIED WITH: J.LEDFORD, PHARMD 02/15/17 0632 L.CHAMPION    Klebsiella pneumoniae NOT DETECTED NOT DETECTED Final   Proteus species NOT DETECTED NOT DETECTED Final   Serratia marcescens NOT DETECTED NOT DETECTED Final   Carbapenem resistance NOT DETECTED NOT DETECTED Final   Haemophilus influenzae NOT DETECTED NOT DETECTED Final   Neisseria meningitidis NOT DETECTED NOT DETECTED Final   Pseudomonas aeruginosa NOT DETECTED NOT DETECTED Final   Candida albicans NOT DETECTED NOT DETECTED Final   Candida glabrata NOT DETECTED NOT DETECTED Final   Candida krusei NOT DETECTED NOT DETECTED Final   Candida parapsilosis NOT DETECTED NOT DETECTED Final  Candida tropicalis NOT DETECTED NOT DETECTED Final  Surgical PCR screen     Status: None   Collection Time: 02/13/17  6:30 AM  Result Value Ref Range Status   MRSA, PCR NEGATIVE NEGATIVE Final   Staphylococcus aureus NEGATIVE NEGATIVE Final    Comment:        The Xpert SA Assay (FDA approved for NASAL specimens in patients over 81 years of age), is one component of a comprehensive surveillance program.  Test performance has been validated by [facility] for patients greater than or equal to 56 year old. It is not intended to diagnose infection nor to guide or monitor treatment.     Anti-infectives:  Anti-infectives    Start     Dose/Rate Route Frequency Ordered Stop   02/17/17 1100  cefTRIAXone (ROCEPHIN) 2 g in dextrose 5 % 50 mL IVPB     2 g 100 mL/hr over 30 Minutes Intravenous Every 24 hours 02/17/17 1041     02/14/17 1500  piperacillin-tazobactam (ZOSYN) IVPB 3.375 g  Status:  Discontinued     3.375 g 12.5 mL/hr over 240 Minutes Intravenous Every 8 hours 02/14/17 1351 02/17/17 1041   02/11/17 1000   piperacillin-tazobactam (ZOSYN) IVPB 3.375 g  Status:  Discontinued     3.375 g 12.5 mL/hr over 240 Minutes Intravenous Every 8 hours 02/11/17 0858 02/14/17 1351   02/11/17 1000  vancomycin (VANCOCIN) IVPB 750 mg/150 ml premix  Status:  Discontinued     750 mg 150 mL/hr over 60 Minutes Intravenous Every 12 hours 02/11/17 0858 02/14/17 0844   02/07/17 2000  ceFAZolin (ANCEF) IVPB 1 g/50 mL premix     1 g 100 mL/hr over 30 Minutes Intravenous Every 8 hours 02/07/17 1416 02/08/17 1543      Best Practice/Protocols:  VTE Prophylaxis: Lovenox (prophylaxtic dose) Continous Sedation  Consults: Treatment Team:  Altamese [place], MD    Studies:    Events:  Subjective:    Overnight Issues:   Objective:  Vital signs for last 24 hours: Temp:  [98.2 F (36.8 C)-100.8 F (38.2 C)] 100.4 F (38 C) (09/01 1213) Pulse Rate:  [51-126] 112 (09/01 1220) Resp:  [20-33] 32 (09/01 1213) BP: (141-183)/(63-113) 155/76 (09/01 1213) SpO2:  [95 %-100 %] 97 % (09/01 1213) FiO2 (%):  [40 %] 40 % (09/01 1213) Weight:  [102 kg (224 lb 13.9 oz)] 102 kg (224 lb 13.9 oz) (09/01 0500)  Hemodynamic parameters for last 24 hours:    Intake/Output from previous day: 08/31 0701 - 09/01 0700 In: 4856.9 [I.V.:1631.9; NG/GT:2860; IV Piggyback:365] Out: 3215 [Urine:3075; Chest Tube:140]  Intake/Output this shift: Total I/O In: 533.5 [I.V.:390.5; NG/GT:143] Out: 610 [Urine:610]  Vent settings for last 24 hours: Vent Mode: PRVC FiO2 (%):  [40 %] 40 % Set Rate:  [20 bmp] 20 bmp Vt Set:  [580 mL] 580 mL PEEP:  [5 cmH20] 5 cmH20 Pressure Support:  [10 cmH20-12 cmH20] 12 cmH20 Plateau Pressure:  [20 cmH20-30 cmH20] 30 cmH20  Physical Exam:  General: on vent Neuro: arouses HEENT/Neck: ETT Resp: clear to auscultation bilaterally CVS: frequent ectopy GI: soft, nontender, BS WNL, no r/g Extremities: edema 3+  Results for orders placed or performed during the hospital encounter of 02/07/17 (from the  past 24 hour(s))  Glucose, capillary     Status: Abnormal   Collection Time: 02/17/17  3:35 PM  Result Value Ref Range   Glucose-Capillary 197 (H) 65 - 99 mg/dL  Glucose, capillary     Status: Abnormal  Collection Time: 02/17/17  8:37 PM  Result Value Ref Range   Glucose-Capillary 212 (H) 65 - 99 mg/dL  Glucose, capillary     Status: Abnormal   Collection Time: 02/18/17 12:21 AM  Result Value Ref Range   Glucose-Capillary 205 (H) 65 - 99 mg/dL  Ammonia     Status: Abnormal   Collection Time: 02/18/17  4:48 AM  Result Value Ref Range   Ammonia 37 (H) 9 - 35 umol/L  CBC     Status: Abnormal   Collection Time: 02/18/17  4:49 AM  Result Value Ref Range   WBC 9.4 4.0 - 10.5 K/uL   RBC 3.29 (L) 4.22 - 5.81 MIL/uL   Hemoglobin 9.6 (L) 13.0 - 17.0 g/dL   HCT 29.3 (L) 39.0 - 52.0 %   MCV 89.1 78.0 - 100.0 fL   MCH 29.2 26.0 - 34.0 pg   MCHC 32.8 30.0 - 36.0 g/dL   RDW 16.6 (H) 11.5 - 15.5 %   Platelets 346 150 - 400 K/uL  Basic metabolic panel     Status: Abnormal   Collection Time: 02/18/17  4:49 AM  Result Value Ref Range   Sodium 152 (H) 135 - 145 mmol/L   Potassium 4.3 3.5 - 5.1 mmol/L   Chloride 122 (H) 101 - 111 mmol/L   CO2 22 22 - 32 mmol/L   Glucose, Bld 252 (H) 65 - 99 mg/dL   BUN 52 (H) 6 - 20 mg/dL   Creatinine, Ser 1.42 (H) 0.61 - 1.24 mg/dL   Calcium 8.1 (L) 8.9 - 10.3 mg/dL   GFR calc non Af Amer 54 (L) >60 mL/min   GFR calc Af Amer >60 >60 mL/min   Anion gap 8 5 - 15  Glucose, capillary     Status: Abnormal   Collection Time: 02/18/17  4:49 AM  Result Value Ref Range   Glucose-Capillary 235 (H) 65 - 99 mg/dL  Glucose, capillary     Status: Abnormal   Collection Time: 02/18/17  7:30 AM  Result Value Ref Range   Glucose-Capillary 213 (H) 65 - 99 mg/dL  Glucose, capillary     Status: Abnormal   Collection Time: 02/18/17 11:39 AM  Result Value Ref Range   Glucose-Capillary 178 (H) 65 - 99 mg/dL    Assessment & Plan: Present on Admission: . Multiple  fractures of ribs, bilateral, initial encounter for closed fracture . Multiple closed anterior-posterior compression fractures of pelvis (HCC)    LOS: 11 days   Additional comments:I reviewed the patient's new clinical lab test results. Marland Kitchen PHB chicken semi L 2-9 and R 3-8 rib FX with occult R PTX and L HPTX- L CT to H2O seal, continue until output < 100cc/24h Grade 3 spleen lac with small extrav- S/P Angioembolization by Dr. Annamaria Boots 8/21 shock- recurrent 8/25 am, S/P L internal iliac artery angioembolization by Dr. 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 Marcelino Scot 8/21 , S/P symphysis plating by Dr. Marcelino Scot 8/27,  - NWB BLE for 8 weeks, no ROM restrictions  Vent dependent acute hypoxic resp failure- cont vent support,  PEEP 5, weaning as tolerated FEN- add 0.45NS for worsening hypernatremia despite free water increase IDDM- lantus 10u QHS, ICU glycemic control protocol ID- change to ceftriaxone for Klebsiella bactermia, change TLC to PICC once blood cleared Renal - AKI improving VTE- PAS, Lovenox Elevated LFTs- direct hyperbilirubinemia from resorbtion of hematomas improving, follow up NH4 37 so no more lactulose Dispo- ICU, I spoke with family (daughter and sister) at the bedside Critical Care Total Time*: 35 Minutes  Georganna Skeans, MD, MPH, FACS Trauma: [phone] General Surgery: [phone]  02/18/2017  *Care during the described time interval was provided by me. I have reviewed this patient's available data, including medical history, events of note, physical examination and test results as part of my  evaluation.  Patient ID: Peter [name], male   DOB: [birth year], 56 y.o.   MRN: [id number]

## 2017-02-18 NOTE — Progress Notes (Signed)
Rehab Admissions Coordinator Note:  Patient was screened by Retta Diones for appropriateness for an Inpatient Acute Rehab Consult.  Once patient is off the vent and is more participatory, recommend inpatient rehab consult.  Jodell Cipro M 02/18/2017, 9:05 AM  I can be reached at [phone].

## 2017-02-19 ENCOUNTER — Inpatient Hospital Stay (HOSPITAL_COMMUNITY): Payer: BLUE CROSS/BLUE SHIELD

## 2017-02-19 DIAGNOSIS — E119 Type 2 diabetes mellitus without complications: Secondary | ICD-10-CM

## 2017-02-19 DIAGNOSIS — E7800 Pure hypercholesterolemia, unspecified: Secondary | ICD-10-CM

## 2017-02-19 DIAGNOSIS — B952 Enterococcus as the cause of diseases classified elsewhere: Secondary | ICD-10-CM

## 2017-02-19 DIAGNOSIS — R7881 Bacteremia: Secondary | ICD-10-CM

## 2017-02-19 LAB — BLOOD CULTURE ID PANEL (REFLEXED)
Acinetobacter baumannii: NOT DETECTED
CANDIDA PARAPSILOSIS: NOT DETECTED
CANDIDA TROPICALIS: NOT DETECTED
Candida albicans: NOT DETECTED
Candida glabrata: NOT DETECTED
Candida krusei: NOT DETECTED
Enterobacter cloacae complex: NOT DETECTED
Enterobacteriaceae species: NOT DETECTED
Enterococcus species: DETECTED — AB
Escherichia coli: NOT DETECTED
HAEMOPHILUS INFLUENZAE: NOT DETECTED
KLEBSIELLA OXYTOCA: NOT DETECTED
KLEBSIELLA PNEUMONIAE: NOT DETECTED
Listeria monocytogenes: NOT DETECTED
Neisseria meningitidis: NOT DETECTED
PROTEUS SPECIES: NOT DETECTED
Pseudomonas aeruginosa: NOT DETECTED
SERRATIA MARCESCENS: NOT DETECTED
STAPHYLOCOCCUS AUREUS BCID: NOT DETECTED
STAPHYLOCOCCUS SPECIES: NOT DETECTED
STREPTOCOCCUS PYOGENES: NOT DETECTED
Streptococcus agalactiae: NOT DETECTED
Streptococcus pneumoniae: NOT DETECTED
Streptococcus species: NOT DETECTED
Vancomycin resistance: NOT DETECTED

## 2017-02-19 LAB — CBC
HCT: 27 % — ABNORMAL LOW (ref 39.0–52.0)
HEMOGLOBIN: 9 g/dL — AB (ref 13.0–17.0)
MCH: 29.5 pg (ref 26.0–34.0)
MCHC: 33.3 g/dL (ref 30.0–36.0)
MCV: 88.5 fL (ref 78.0–100.0)
Platelets: 414 10*3/uL — ABNORMAL HIGH (ref 150–400)
RBC: 3.05 MIL/uL — AB (ref 4.22–5.81)
RDW: 16.5 % — ABNORMAL HIGH (ref 11.5–15.5)
WBC: 8.7 10*3/uL (ref 4.0–10.5)

## 2017-02-19 LAB — BASIC METABOLIC PANEL
ANION GAP: 6 (ref 5–15)
BUN: 51 mg/dL — AB (ref 6–20)
CHLORIDE: 122 mmol/L — AB (ref 101–111)
CO2: 23 mmol/L (ref 22–32)
Calcium: 8.1 mg/dL — ABNORMAL LOW (ref 8.9–10.3)
Creatinine, Ser: 1.39 mg/dL — ABNORMAL HIGH (ref 0.61–1.24)
GFR calc Af Amer: >60 mL/min (ref >60)
GFR calc non Af Amer: 55 mL/min — ABNORMAL LOW (ref >60)
Glucose, Bld: 180 mg/dL — ABNORMAL HIGH (ref 65–99)
POTASSIUM: 4.2 mmol/L (ref 3.5–5.1)
SODIUM: 151 mmol/L — AB (ref 135–145)

## 2017-02-19 LAB — GLUCOSE, CAPILLARY
GLUCOSE-CAPILLARY: 149 mg/dL — AB (ref 65–99)
GLUCOSE-CAPILLARY: 196 mg/dL — AB (ref 65–99)
GLUCOSE-CAPILLARY: 199 mg/dL — AB (ref 65–99)
GLUCOSE-CAPILLARY: 219 mg/dL — AB (ref 65–99)
Glucose-Capillary: 152 mg/dL — ABNORMAL HIGH (ref 65–99)
Glucose-Capillary: 179 mg/dL — ABNORMAL HIGH (ref 65–99)
Glucose-Capillary: 207 mg/dL — ABNORMAL HIGH (ref 65–99)

## 2017-02-19 MED ORDER — POLYETHYLENE GLYCOL 3350 17 G PO PACK
17.0000 g | PACK | Freq: Two times a day (BID) | ORAL | Status: DC
  Administered 2017-02-19 – 2017-03-22 (×33): 17 g
  Filled 2017-02-19 (×39): qty 1

## 2017-02-19 MED ORDER — VANCOMYCIN HCL IN DEXTROSE 750-5 MG/150ML-% IV SOLN
750.0000 mg | Freq: Two times a day (BID) | INTRAVENOUS | Status: DC
  Administered 2017-02-19 – 2017-02-20 (×2): 750 mg via INTRAVENOUS
  Filled 2017-02-19 (×2): qty 150

## 2017-02-19 MED ORDER — WHITE PETROLATUM GEL
Status: AC
  Administered 2017-02-19: 10:00:00
  Filled 2017-02-19: qty 1

## 2017-02-19 MED ORDER — VANCOMYCIN HCL 10 G IV SOLR
2000.0000 mg | Freq: Once | INTRAVENOUS | Status: AC
  Administered 2017-02-19: 2000 mg via INTRAVENOUS
  Filled 2017-02-19: qty 2000
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
## 2017-02-19 NOTE — Progress Notes (Signed)
Pharmacy Antibiotic Note  Peter [name] is a 56 y.o. male admitted on 02/07/2017 with sepsis.   Patient has been on ceftriaxone for klebsiella bacteremia since 8/25. Now with new cultures positive for GPR in blood. Will change to vancomycin until sensitivities result.  Plan: Vancomycin 2g IV now then 750mg  IV q12 hours Check trough at steady state  Height: 5\' 10"  (177.8 cm) Weight: 227 lb 11.8 oz (103.3 kg) IBW/kg (Calculated) : 73  Temp (24hrs), Avg:99.9 F (37.7 C), Min:98.4 F (36.9 C), Max:101.7 F (38.7 C)   Recent Labs Lab 02/15/17 0430 02/16/17 0410 02/17/17 0426 02/18/17 0449 02/19/17 0535  WBC 9.8 9.8 7.4 9.4 8.7  CREATININE 1.40* 1.44* 1.44* 1.42* 1.39*    Estimated Creatinine Clearance: 71.4 mL/min (A) (by C-G formula based on SCr of 1.39 mg/dL (H)).    No Known Allergies  Erin Hearing PharmD., BCPS Clinical Pharmacist Pager [phone] 02/19/2017 8:06 AM

## 2017-02-19 NOTE — Progress Notes (Signed)
EET holder changed with the help of another RT, without events.

## 2017-02-19 NOTE — Progress Notes (Signed)
Follow up - Trauma Critical Care  Patient Details:    Peter [name] is an 56 y.o. male.  Lines/tubes : Airway 8 mm (Active)  Secured at (cm) 24 cm 02/19/2017  8:00 AM  Measured From Lips 02/19/2017  8:00 AM  Secured Location Right 02/19/2017  7:50 AM  Secured By [employer] 02/19/2017  7:50 AM  Tube Holder Repositioned Yes 02/19/2017  7:50 AM  Cuff Pressure (cm H2O) 26 cm H2O 02/19/2017  7:50 AM  Site Condition Dry 02/19/2017  8:00 AM     Chest Tube 1 Left Pleural 14 Fr. (Active)  Suction To water seal 02/19/2017  8:00 AM  Chest Tube Air Leak None 02/19/2017  8:00 AM  Patency Intervention Milked 02/18/2017  8:00 PM  Drainage Description Serous;Yellow 02/19/2017  8:00 AM  Dressing Status Clean;Dry;Intact 02/19/2017  8:00 AM  Dressing Intervention New dressing 02/17/2017  4:00 AM  Site Assessment Clean;Dry;Intact 02/18/2017  8:00 PM  Surrounding Skin Unable to view 02/18/2017  8:00 PM  Output (mL) 30 mL 02/19/2017  6:00 AM     NG/OG Tube Orogastric 16 Fr. Right mouth Xray (Active)  Cm Marking at Nare/Corner of Mouth (if applicable) 60 cm 10/19/8839  8:00 AM  Site Assessment Clean;Dry;Intact 02/19/2017  8:00 AM  Ongoing Placement Verification No change in cm markings or external length of tube from initial placement;No change in respiratory status 02/19/2017  8:00 AM  Status Infusing tube feed 02/19/2017  8:00 AM  Drainage Appearance Green 02/18/2017  5:00 AM  Intake (mL) 60 mL 02/18/2017  6:00 AM  Output (mL) 150 mL 02/13/2017 10:00 PM     Urethral Catheter [name], EMT Double-lumen 16 Fr. (Active)  Indication for Insertion or Continuance of Catheter Bladder outlet obstruction / other urologic reason 02/19/2017  8:00 AM  Site Assessment Edema;Swelling 02/19/2017  8:00 AM  Catheter Maintenance Bag below level of bladder;Catheter secured;Drainage bag/tubing not touching floor;Insertion date on drainage bag;No dependent loops;Seal intact 02/18/2017  8:00 PM  Collection Container Standard drainage bag 02/18/2017  8:00  AM  Securement Method Leg strap 02/18/2017  8:00 PM  Urinary Catheter Interventions Unclamped 02/18/2017  8:00 PM  Output (mL) 165 mL 02/19/2017 10:00 AM    Microbiology/Sepsis markers: Results for orders placed or performed during the hospital encounter of 02/07/17  MRSA PCR Screening     Status: None   Collection Time: 02/08/17  4:08 AM  Result Value Ref Range Status   MRSA by PCR NEGATIVE NEGATIVE Final    Comment:        The GeneXpert MRSA Assay (FDA approved for NASAL specimens only), is one component of a comprehensive MRSA colonization surveillance program. It is not intended to diagnose MRSA infection nor to guide or monitor treatment for MRSA infections.   Culture, respiratory (NON-Expectorated)     Status: None   Collection Time: 02/10/17 10:59 AM  Result Value Ref Range Status   Specimen Description TRACHEAL ASPIRATE  Final   Special Requests Normal  Final   Gram Stain   Final    ABUNDANT WBC PRESENT, PREDOMINANTLY PMN RARE SQUAMOUS EPITHELIAL CELLS PRESENT ABUNDANT GRAM NEGATIVE COCCI IN PAIRS ABUNDANT GRAM NEGATIVE RODS MODERATE GRAM POSITIVE COCCI IN CHAINS    Culture ABUNDANT Consistent with normal respiratory flora.  Final   Report Status 02/12/2017 FINAL  Final  Culture, blood (Routine X 2) w Reflex to ID Panel     Status: Abnormal   Collection Time: 02/11/17  3:25 PM  Result Value Ref Range  Status   Specimen Description BLOOD RIGHT HAND  Final   Special Requests   Final    BOTTLES DRAWN AEROBIC ONLY Blood Culture adequate volume   Culture  Setup Time   Final    GRAM NEGATIVE RODS AEROBIC BOTTLE ONLY CRITICAL VALUE NOTED.  VALUE IS CONSISTENT WITH PREVIOUSLY REPORTED AND CALLED VALUE.    Culture (A)  Final    KLEBSIELLA OXYTOCA SUSCEPTIBILITIES PERFORMED ON PREVIOUS CULTURE WITHIN THE LAST 5 DAYS.    Report Status 02/17/2017 FINAL  Final  Culture, blood (Routine X 2) w Reflex to ID Panel     Status: Abnormal   Collection Time: 02/11/17  3:25 PM   Result Value Ref Range Status   Specimen Description BLOOD RIGHT HAND  Final   Special Requests   Final    BOTTLES DRAWN AEROBIC ONLY Blood Culture adequate volume   Culture  Setup Time   Final    GRAM NEGATIVE RODS AEROBIC BOTTLE ONLY CRITICAL RESULT CALLED TO, READ BACK BY AND VERIFIED WITH: J.LEDFORD, PHARMD 02/15/17 0632 L.CHAMPION    Culture KLEBSIELLA OXYTOCA (A)  Final   Report Status 02/17/2017 FINAL  Final   Organism ID, Bacteria KLEBSIELLA OXYTOCA  Final      Susceptibility   Klebsiella oxytoca - MIC*    AMPICILLIN >=32 RESISTANT Resistant     CEFAZOLIN >=64 RESISTANT Resistant     CEFEPIME <=1 SENSITIVE Sensitive     CEFTAZIDIME <=1 SENSITIVE Sensitive     CEFTRIAXONE <=1 SENSITIVE Sensitive     CIPROFLOXACIN <=0.25 SENSITIVE Sensitive     GENTAMICIN <=1 SENSITIVE Sensitive     IMIPENEM <=0.25 SENSITIVE Sensitive     TRIMETH/SULFA <=20 SENSITIVE Sensitive     AMPICILLIN/SULBACTAM 8 SENSITIVE Sensitive     PIP/TAZO <=4 SENSITIVE Sensitive     Extended ESBL NEGATIVE Sensitive     * KLEBSIELLA OXYTOCA  Blood Culture ID Panel (Reflexed)     Status: Abnormal   Collection Time: 02/11/17  3:25 PM  Result Value Ref Range Status   Enterococcus species NOT DETECTED NOT DETECTED Final   Listeria monocytogenes NOT DETECTED NOT DETECTED Final   Staphylococcus species NOT DETECTED NOT DETECTED Final   Staphylococcus aureus NOT DETECTED NOT DETECTED Final   Streptococcus species NOT DETECTED NOT DETECTED Final   Streptococcus agalactiae NOT DETECTED NOT DETECTED Final   Streptococcus pneumoniae NOT DETECTED NOT DETECTED Final   Streptococcus pyogenes NOT DETECTED NOT DETECTED Final   Acinetobacter baumannii NOT DETECTED NOT DETECTED Final   Enterobacteriaceae species DETECTED (A) NOT DETECTED Final    Comment: Enterobacteriaceae represent a large family of gram-negative bacteria, not a single organism. CRITICAL RESULT CALLED TO, READ BACK BY AND VERIFIED WITH: J.LEDFORD, PHARMD  02/15/17 0632 L.CHAMPION    Enterobacter cloacae complex NOT DETECTED NOT DETECTED Final   Escherichia coli NOT DETECTED NOT DETECTED Final   Klebsiella oxytoca DETECTED (A) NOT DETECTED Final    Comment: CRITICAL RESULT CALLED TO, READ BACK BY AND VERIFIED WITH: J.LEDFORD, PHARMD 02/15/17 0632 L.CHAMPION    Klebsiella pneumoniae NOT DETECTED NOT DETECTED Final   Proteus species NOT DETECTED NOT DETECTED Final   Serratia marcescens NOT DETECTED NOT DETECTED Final   Carbapenem resistance NOT DETECTED NOT DETECTED Final   Haemophilus influenzae NOT DETECTED NOT DETECTED Final   Neisseria meningitidis NOT DETECTED NOT DETECTED Final   Pseudomonas aeruginosa NOT DETECTED NOT DETECTED Final   Candida albicans NOT DETECTED NOT DETECTED Final   Candida glabrata NOT DETECTED NOT DETECTED Final  Candida krusei NOT DETECTED NOT DETECTED Final   Candida parapsilosis NOT DETECTED NOT DETECTED Final   Candida tropicalis NOT DETECTED NOT DETECTED Final  Surgical PCR screen     Status: None   Collection Time: 02/13/17  6:30 AM  Result Value Ref Range Status   MRSA, PCR NEGATIVE NEGATIVE Final   Staphylococcus aureus NEGATIVE NEGATIVE Final    Comment:        The Xpert SA Assay (FDA approved for NASAL specimens in patients over 29 years of age), is one component of a comprehensive surveillance program.  Test performance has been validated by [facility] for patients greater than or equal to 71 year old. It is not intended to diagnose infection nor to guide or monitor treatment.   Culture, blood (Routine X 2) w Reflex to ID Panel     Status: None (Preliminary result)   Collection Time: 02/18/17  2:55 PM  Result Value Ref Range Status   Specimen Description BLOOD RIGHT WRIST  Final   Special Requests IN PEDIATRIC BOTTLE Blood Culture adequate volume  Final   Culture  Setup Time   Final    IN PEDIATRIC BOTTLE GRAM POSITIVE RODS CRITICAL RESULT CALLED TO, READ BACK BY AND VERIFIED WITH: L  FOLTANSKI,PHARMD AT 0730 02/19/17 BY L BENFIELD    Culture GRAM POSITIVE RODS  Final   Report Status PENDING  Incomplete  Blood Culture ID Panel (Reflexed)     Status: Abnormal   Collection Time: 02/18/17  2:55 PM  Result Value Ref Range Status   Enterococcus species DETECTED (A) NOT DETECTED Final    Comment: CRITICAL RESULT CALLED TO, READ BACK BY AND VERIFIED WITH: L FOLTANSKI,PHARMD AT 0730 02/19/17 BY L BENFIELD    Vancomycin resistance NOT DETECTED NOT DETECTED Final   Listeria monocytogenes NOT DETECTED NOT DETECTED Final   Staphylococcus species NOT DETECTED NOT DETECTED Final   Staphylococcus aureus NOT DETECTED NOT DETECTED Final   Streptococcus species NOT DETECTED NOT DETECTED Final   Streptococcus agalactiae NOT DETECTED NOT DETECTED Final   Streptococcus pneumoniae NOT DETECTED NOT DETECTED Final   Streptococcus pyogenes NOT DETECTED NOT DETECTED Final   Acinetobacter baumannii NOT DETECTED NOT DETECTED Final   Enterobacteriaceae species NOT DETECTED NOT DETECTED Final   Enterobacter cloacae complex NOT DETECTED NOT DETECTED Final   Escherichia coli NOT DETECTED NOT DETECTED Final   Klebsiella oxytoca NOT DETECTED NOT DETECTED Final   Klebsiella pneumoniae NOT DETECTED NOT DETECTED Final   Proteus species NOT DETECTED NOT DETECTED Final   Serratia marcescens NOT DETECTED NOT DETECTED Final   Haemophilus influenzae NOT DETECTED NOT DETECTED Final   Neisseria meningitidis NOT DETECTED NOT DETECTED Final   Pseudomonas aeruginosa NOT DETECTED NOT DETECTED Final   Candida albicans NOT DETECTED NOT DETECTED Final   Candida glabrata NOT DETECTED NOT DETECTED Final   Candida krusei NOT DETECTED NOT DETECTED Final   Candida parapsilosis NOT DETECTED NOT DETECTED Final   Candida tropicalis NOT DETECTED NOT DETECTED Final    Anti-infectives:  Anti-infectives    Start     Dose/Rate Route Frequency Ordered Stop   02/19/17 2200  vancomycin (VANCOCIN) IVPB 750 mg/150 ml premix      750 mg 150 mL/hr over 60 Minutes Intravenous Every 12 hours 02/19/17 0807     02/19/17 0900  vancomycin (VANCOCIN) 2,000 mg in sodium chloride 0.9 % 500 mL IVPB     2,000 mg 250 mL/hr over 120 Minutes Intravenous  Once 02/19/17 0807  02/17/17 1100  cefTRIAXone (ROCEPHIN) 2 g in dextrose 5 % 50 mL IVPB  Status:  Discontinued     2 g 100 mL/hr over 30 Minutes Intravenous Every 24 hours 02/17/17 1041 02/19/17 0801   02/14/17 1500  piperacillin-tazobactam (ZOSYN) IVPB 3.375 g  Status:  Discontinued     3.375 g 12.5 mL/hr over 240 Minutes Intravenous Every 8 hours 02/14/17 1351 02/17/17 1041   02/11/17 1000  piperacillin-tazobactam (ZOSYN) IVPB 3.375 g  Status:  Discontinued     3.375 g 12.5 mL/hr over 240 Minutes Intravenous Every 8 hours 02/11/17 0858 02/14/17 1351   02/11/17 1000  vancomycin (VANCOCIN) IVPB 750 mg/150 ml premix  Status:  Discontinued     750 mg 150 mL/hr over 60 Minutes Intravenous Every 12 hours 02/11/17 0858 02/14/17 0844   02/07/17 2000  ceFAZolin (ANCEF) IVPB 1 g/50 mL premix     1 g 100 mL/hr over 30 Minutes Intravenous Every 8 hours 02/07/17 1416 02/08/17 1543      Consults: Treatment Team:  Altamese Heathrow, MD    Studies:    Events:   Subjective:    Overnight Issues:  No major events  Objective:  Vital signs for last 24 hours: Temp:  [98.1 F (36.7 C)-101.7 F (38.7 C)] 98.1 F (36.7 C) (09/02 1000) Pulse Rate:  [33-121] 43 (09/02 1000) Resp:  [20-33] 20 (09/02 1000) BP: (112-182)/(58-113) 133/61 (09/02 1000) SpO2:  [93 %-100 %] 100 % (09/02 1000) FiO2 (%):  [40 %] 40 % (09/02 1000) Weight:  [103.3 kg (227 lb 11.8 oz)] 103.3 kg (227 lb 11.8 oz) (09/02 0500)  Hemodynamic parameters for last 24 hours:    Intake/Output from previous day: 09/01 0701 - 09/02 0700 In: 4116.9 [I.V.:1943.9; NG/GT:1913; IV Piggyback:260] Out: 6606 [Urine:1740; Chest Tube:50]  Intake/Output this shift: Total I/O In: 474.2 [I.V.:294.2; NG/GT:180] Out: 325  [Urine:325]  Vent settings for last 24 hours: Vent Mode: PRVC FiO2 (%):  [40 %] 40 % Set Rate:  [20 bmp] 20 bmp Vt Set:  [580 mL] 580 mL PEEP:  [5 cmH20] 5 cmH20 Pressure Support:  [12 cmH20] 12 cmH20 Plateau Pressure:  [23 cmH20-30 cmH20] 24 cmH20  Physical Exam:  Gen: intubated, sedated, jaundiced Neuro: GCS 3t HEENT: ETT in place, jaundiced Resp: coarse b/l Card: RRR Abd: soft, NT, ND Ext: +edema all extremities  Results for orders placed or performed during the hospital encounter of 02/07/17 (from the past 24 hour(s))  Glucose, capillary     Status: Abnormal   Collection Time: 02/18/17 11:39 AM  Result Value Ref Range   Glucose-Capillary 178 (H) 65 - 99 mg/dL  Culture, blood (Routine X 2) w Reflex to ID Panel     Status: None (Preliminary result)   Collection Time: 02/18/17  2:55 PM  Result Value Ref Range   Specimen Description BLOOD RIGHT WRIST    Special Requests IN PEDIATRIC BOTTLE Blood Culture adequate volume    Culture  Setup Time      IN PEDIATRIC BOTTLE GRAM POSITIVE RODS CRITICAL RESULT CALLED TO, READ BACK BY AND VERIFIED WITH: L FOLTANSKI,PHARMD AT 0730 02/19/17 BY L BENFIELD    Culture GRAM POSITIVE RODS    Report Status PENDING   Blood Culture ID Panel (Reflexed)     Status: Abnormal   Collection Time: 02/18/17  2:55 PM  Result Value Ref Range   Enterococcus species DETECTED (A) NOT DETECTED   Vancomycin resistance NOT DETECTED NOT DETECTED   Listeria monocytogenes NOT DETECTED NOT DETECTED  Staphylococcus species NOT DETECTED NOT DETECTED   Staphylococcus aureus NOT DETECTED NOT DETECTED   Streptococcus species NOT DETECTED NOT DETECTED   Streptococcus agalactiae NOT DETECTED NOT DETECTED   Streptococcus pneumoniae NOT DETECTED NOT DETECTED   Streptococcus pyogenes NOT DETECTED NOT DETECTED   Acinetobacter baumannii NOT DETECTED NOT DETECTED   Enterobacteriaceae species NOT DETECTED NOT DETECTED   Enterobacter cloacae complex NOT DETECTED NOT  DETECTED   Escherichia coli NOT DETECTED NOT DETECTED   Klebsiella oxytoca NOT DETECTED NOT DETECTED   Klebsiella pneumoniae NOT DETECTED NOT DETECTED   Proteus species NOT DETECTED NOT DETECTED   Serratia marcescens NOT DETECTED NOT DETECTED   Haemophilus influenzae NOT DETECTED NOT DETECTED   Neisseria meningitidis NOT DETECTED NOT DETECTED   Pseudomonas aeruginosa NOT DETECTED NOT DETECTED   Candida albicans NOT DETECTED NOT DETECTED   Candida glabrata NOT DETECTED NOT DETECTED   Candida krusei NOT DETECTED NOT DETECTED   Candida parapsilosis NOT DETECTED NOT DETECTED   Candida tropicalis NOT DETECTED NOT DETECTED  Glucose, capillary     Status: Abnormal   Collection Time: 02/18/17  3:53 PM  Result Value Ref Range   Glucose-Capillary 193 (H) 65 - 99 mg/dL  Glucose, capillary     Status: Abnormal   Collection Time: 02/18/17  8:54 PM  Result Value Ref Range   Glucose-Capillary 224 (H) 65 - 99 mg/dL  Glucose, capillary     Status: Abnormal   Collection Time: 02/19/17 12:32 AM  Result Value Ref Range   Glucose-Capillary 219 (H) 65 - 99 mg/dL  Glucose, capillary     Status: Abnormal   Collection Time: 02/19/17  4:21 AM  Result Value Ref Range   Glucose-Capillary 199 (H) 65 - 99 mg/dL  CBC     Status: Abnormal   Collection Time: 02/19/17  5:35 AM  Result Value Ref Range   WBC 8.7 4.0 - 10.5 K/uL   RBC 3.05 (L) 4.22 - 5.81 MIL/uL   Hemoglobin 9.0 (L) 13.0 - 17.0 g/dL   HCT 27.0 (L) 39.0 - 52.0 %   MCV 88.5 78.0 - 100.0 fL   MCH 29.5 26.0 - 34.0 pg   MCHC 33.3 30.0 - 36.0 g/dL   RDW 16.5 (H) 11.5 - 15.5 %   Platelets 414 (H) 150 - 400 K/uL  Basic metabolic panel     Status: Abnormal   Collection Time: 02/19/17  5:35 AM  Result Value Ref Range   Sodium 151 (H) 135 - 145 mmol/L   Potassium 4.2 3.5 - 5.1 mmol/L   Chloride 122 (H) 101 - 111 mmol/L   CO2 23 22 - 32 mmol/L   Glucose, Bld 180 (H) 65 - 99 mg/dL   BUN 51 (H) 6 - 20 mg/dL   Creatinine, Ser 1.39 (H) 0.61 - 1.24  mg/dL   Calcium 8.1 (L) 8.9 - 10.3 mg/dL   GFR calc non Af Amer 55 (L) >60 mL/min   GFR calc Af Amer >60 >60 mL/min   Anion gap 6 5 - 15  Glucose, capillary     Status: Abnormal   Collection Time: 02/19/17  7:55 AM  Result Value Ref Range   Glucose-Capillary 152 (H) 65 - 99 mg/dL    Assessment & Plan: Present on Admission: . Multiple fractures of ribs, bilateral, initial encounter for closed fracture . Multiple closed anterior-posterior compression fractures of pelvis (HCC)    LOS: 12 days  PHB chicken semi   Neuro: Sedation weaning for vent dependent respiratory  failure  Cardiac: shock- recurrent 8/25 am, S/P L internal iliac artery angioembolization by Dr. Annamaria Boots 8/21 and splenic angioembolization 8/21; resolved CV - freq PACs, continue lopressor  Resp: L 2-9 and R 3-8 rib FX with occult R PTX and L HPTX- L CT to H2O seal, continue until output < 100cc/24h Vent dependent acute hypoxic resp failure- cont vent support, PEEP 5, weaningas tolerated, may require trach this week  GI: Elevated LFTs- direct hyperbilirubinemia from resorbtion of hematomas improving, follow up NH4 37 so no more lactulose  ID: ceftriaxone for Klebsiella bactermia, change TLC to PICC once blood cleared Enterococcus bactermia- start vanc, ID consult  Renal/FEN: R adrenal hemorrhage Grade 2 R renal lac add 0.45NS for worsening hypernatremia despite free water increase  Other Injuries: 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 Marcelino Scot 8/21 , S/P symphysis plating by Dr. Marcelino Scot 8/27,  - NWB BLE for 8 weeks, no ROM restrictions   Hematology: Grade 3 spleen lac with small extrav- S/P Angioembolization by Dr. Annamaria Boots 8/21 ABL anemia -stable VTE- PAS, Lovenox  Endocrine: IDDM- lantus 10u QHS, ICU glycemic control protocol  Global Issues: Dispo- ICU, I spoke with family (daughter and sister) at the bedside  L gaze/SZ and  neuro W/U- for MR head and C spine 08/28 showed foci of reduced diffusion within subcortial white matter, may represent an acute shear injury, Keppra BID  Critical Care Total Time*: 15 Minutes  Gurney Maxin, M.D. [facility] Pg: [phone]  02/19/2017  *Care during the described time interval was provided by me. I have reviewed this patient's available data, including medical history, events of note, physical examination and test results as part of my evaluation.

## 2017-02-19 NOTE — Progress Notes (Signed)
PHARMACY - PHYSICIAN COMMUNICATION CRITICAL VALUE ALERT - BLOOD CULTURE IDENTIFICATION (BCID)  Blood cultures 9/1: 1/2 gram positive rods (no gram positive cocci observed on gram stain)  Results for orders placed or performed during the hospital encounter of 02/07/17  Blood Culture ID Panel (Reflexed) (Collected: 02/18/2017  2:55 PM)  Result Value Ref Range   Enterococcus species DETECTED (A) NOT DETECTED   Vancomycin resistance NOT DETECTED NOT DETECTED   Listeria monocytogenes NOT DETECTED NOT DETECTED   Staphylococcus species NOT DETECTED NOT DETECTED   Staphylococcus aureus NOT DETECTED NOT DETECTED   Streptococcus species NOT DETECTED NOT DETECTED   Streptococcus agalactiae NOT DETECTED NOT DETECTED   Streptococcus pneumoniae NOT DETECTED NOT DETECTED   Streptococcus pyogenes NOT DETECTED NOT DETECTED   Acinetobacter baumannii NOT DETECTED NOT DETECTED   Enterobacteriaceae species NOT DETECTED NOT DETECTED   Enterobacter cloacae complex NOT DETECTED NOT DETECTED   Escherichia coli NOT DETECTED NOT DETECTED   Klebsiella oxytoca NOT DETECTED NOT DETECTED   Klebsiella pneumoniae NOT DETECTED NOT DETECTED   Proteus species NOT DETECTED NOT DETECTED   Serratia marcescens NOT DETECTED NOT DETECTED   Haemophilus influenzae NOT DETECTED NOT DETECTED   Neisseria meningitidis NOT DETECTED NOT DETECTED   Pseudomonas aeruginosa NOT DETECTED NOT DETECTED   Candida albicans NOT DETECTED NOT DETECTED   Candida glabrata NOT DETECTED NOT DETECTED   Candida krusei NOT DETECTED NOT DETECTED   Candida parapsilosis NOT DETECTED NOT DETECTED   Candida tropicalis NOT DETECTED NOT DETECTED    Name of physician (or Provider) Contacted: Georganna Skeans  Changes to prescribed antibiotics required: change ceftriaxone to vancomycin. Pharmacy to dose vancomycin  Charlene Brooke, PharmD PGY1 [facility] Resident Pager: [phone] After 3:00PM please call [place] [phone] 02/19/2017  7:58 AM

## 2017-02-20 ENCOUNTER — Inpatient Hospital Stay (HOSPITAL_COMMUNITY): Payer: BLUE CROSS/BLUE SHIELD

## 2017-02-20 LAB — GLUCOSE, CAPILLARY
Glucose-Capillary: 176 mg/dL — ABNORMAL HIGH (ref 65–99)
Glucose-Capillary: 188 mg/dL — ABNORMAL HIGH (ref 65–99)
Glucose-Capillary: 189 mg/dL — ABNORMAL HIGH (ref 65–99)
Glucose-Capillary: 191 mg/dL — ABNORMAL HIGH (ref 65–99)
Glucose-Capillary: 199 mg/dL — ABNORMAL HIGH (ref 65–99)
Glucose-Capillary: 221 mg/dL — ABNORMAL HIGH (ref 65–99)

## 2017-02-20 MED ORDER — QUETIAPINE FUMARATE 100 MG PO TABS
100.0000 mg | ORAL_TABLET | Freq: Three times a day (TID) | ORAL | Status: DC
  Administered 2017-02-20 – 2017-03-06 (×41): 100 mg
  Filled 2017-02-20 (×42): qty 1

## 2017-02-20 MED ORDER — CLONAZEPAM 1 MG PO TABS
1.0000 mg | ORAL_TABLET | Freq: Three times a day (TID) | ORAL | Status: DC
  Administered 2017-02-20 – 2017-02-25 (×15): 1 mg
  Filled 2017-02-20 (×15): qty 1

## 2017-02-20 MED ORDER — BISACODYL 10 MG RE SUPP
10.0000 mg | Freq: Every day | RECTAL | Status: DC | PRN
  Administered 2017-02-22: 10 mg via RECTAL
  Filled 2017-02-20: qty 1
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
## 2017-02-20 NOTE — Progress Notes (Addendum)
[facility]   Reason for visit: Follow up on positive blood culture  Interval History: blood culture grew Bacillus rather than Enterococcus from [facility]; no fever, wbc wnl.  Remains intubate  Physical Exam: Constitutional:  Vitals:   02/20/17 0900 02/20/17 1058  BP: 127/67 137/74  Pulse: 87 95  Resp: 20 (!) 21  Temp: 99.9 F (37.7 C)   SpO2: 97% 99%   patient appears in NAD, sedated Respiratory: intubated Cardiovascular: RRR GI: soft, nt, nd  Review of Systems: Unable to be assessed due to mental status  Lab Results  Component Value Date   WBC 8.7 02/19/2017   HGB 9.0 (L) 02/19/2017   HCT 27.0 (L) 02/19/2017   MCV 88.5 02/19/2017   PLT 414 (H) 02/19/2017    Lab Results  Component Value Date   CREATININE 1.39 (H) 02/19/2017   BUN 51 (H) 02/19/2017   NA 151 (H) 02/19/2017   K 4.2 02/19/2017   CL 122 (H) 02/19/2017   CO2 23 02/19/2017    Lab Results  Component Value Date   ALT 162 (H) 02/17/2017   AST 36 02/17/2017   ALKPHOS 198 (H) 02/17/2017     Microbiology: Recent Results (from the past 240 hour(s))  Culture, blood (Routine X 2) w Reflex to ID Panel     Status: Abnormal   Collection Time: 02/11/17  3:25 PM  Result Value Ref Range Status   Specimen Description BLOOD RIGHT HAND  Final   Special Requests   Final    BOTTLES DRAWN AEROBIC ONLY Blood Culture adequate volume   Culture  Setup Time   Final    GRAM NEGATIVE RODS AEROBIC BOTTLE ONLY CRITICAL VALUE NOTED.  VALUE IS CONSISTENT WITH PREVIOUSLY REPORTED AND CALLED VALUE.    Culture (A)  Final    KLEBSIELLA OXYTOCA SUSCEPTIBILITIES PERFORMED ON PREVIOUS CULTURE WITHIN THE LAST 5 DAYS.    Report Status 02/17/2017 FINAL  Final  Culture, blood (Routine X 2) w Reflex to ID Panel     Status: Abnormal   Collection Time: 02/11/17  3:25 PM  Result Value Ref Range Status   Specimen Description BLOOD RIGHT HAND  Final   Special Requests   Final    BOTTLES DRAWN AEROBIC ONLY  Blood Culture adequate volume   Culture  Setup Time   Final    GRAM NEGATIVE RODS AEROBIC BOTTLE ONLY CRITICAL RESULT CALLED TO, READ BACK BY AND VERIFIED WITH: J.LEDFORD, PHARMD 02/15/17 0632 L.CHAMPION    Culture KLEBSIELLA OXYTOCA (A)  Final   Report Status 02/17/2017 FINAL  Final   Organism ID, Bacteria KLEBSIELLA OXYTOCA  Final      Susceptibility   Klebsiella oxytoca - MIC*    AMPICILLIN >=32 RESISTANT Resistant     CEFAZOLIN >=64 RESISTANT Resistant     CEFEPIME <=1 SENSITIVE Sensitive     CEFTAZIDIME <=1 SENSITIVE Sensitive     CEFTRIAXONE <=1 SENSITIVE Sensitive     CIPROFLOXACIN <=0.25 SENSITIVE Sensitive     GENTAMICIN <=1 SENSITIVE Sensitive     IMIPENEM <=0.25 SENSITIVE Sensitive     TRIMETH/SULFA <=20 SENSITIVE Sensitive     AMPICILLIN/SULBACTAM 8 SENSITIVE Sensitive     PIP/TAZO <=4 SENSITIVE Sensitive     Extended ESBL NEGATIVE Sensitive     * KLEBSIELLA OXYTOCA  Blood Culture ID Panel (Reflexed)     Status: Abnormal   Collection Time: 02/11/17  3:25 PM  Result Value Ref Range Status   Enterococcus species NOT DETECTED  NOT DETECTED Final   Listeria monocytogenes NOT DETECTED NOT DETECTED Final   Staphylococcus species NOT DETECTED NOT DETECTED Final   Staphylococcus aureus NOT DETECTED NOT DETECTED Final   Streptococcus species NOT DETECTED NOT DETECTED Final   Streptococcus agalactiae NOT DETECTED NOT DETECTED Final   Streptococcus pneumoniae NOT DETECTED NOT DETECTED Final   Streptococcus pyogenes NOT DETECTED NOT DETECTED Final   Acinetobacter baumannii NOT DETECTED NOT DETECTED Final   Enterobacteriaceae species DETECTED (A) NOT DETECTED Final    Comment: Enterobacteriaceae represent a large family of gram-negative bacteria, not a single organism. CRITICAL RESULT CALLED TO, READ BACK BY AND VERIFIED WITH: J.LEDFORD, PHARMD 02/15/17 0632 L.CHAMPION    Enterobacter cloacae complex NOT DETECTED NOT DETECTED Final   Escherichia coli NOT DETECTED NOT DETECTED  Final   Klebsiella oxytoca DETECTED (A) NOT DETECTED Final    Comment: CRITICAL RESULT CALLED TO, READ BACK BY AND VERIFIED WITH: J.LEDFORD, PHARMD 02/15/17 0632 L.CHAMPION    Klebsiella pneumoniae NOT DETECTED NOT DETECTED Final   Proteus species NOT DETECTED NOT DETECTED Final   Serratia marcescens NOT DETECTED NOT DETECTED Final   Carbapenem resistance NOT DETECTED NOT DETECTED Final   Haemophilus influenzae NOT DETECTED NOT DETECTED Final   Neisseria meningitidis NOT DETECTED NOT DETECTED Final   Pseudomonas aeruginosa NOT DETECTED NOT DETECTED Final   Candida albicans NOT DETECTED NOT DETECTED Final   Candida glabrata NOT DETECTED NOT DETECTED Final   Candida krusei NOT DETECTED NOT DETECTED Final   Candida parapsilosis NOT DETECTED NOT DETECTED Final   Candida tropicalis NOT DETECTED NOT DETECTED Final  Surgical PCR screen     Status: None   Collection Time: 02/13/17  6:30 AM  Result Value Ref Range Status   MRSA, PCR NEGATIVE NEGATIVE Final   Staphylococcus aureus NEGATIVE NEGATIVE Final    Comment:        The Xpert SA Assay (FDA approved for NASAL specimens in patients over 75 years of age), is one component of a comprehensive surveillance program.  Test performance has been validated by [facility] for patients greater than or equal to 70 year old. It is not intended to diagnose infection nor to guide or monitor treatment.   Culture, blood (Routine X 2) w Reflex to ID Panel     Status: None (Preliminary result)   Collection Time: 02/18/17  2:52 PM  Result Value Ref Range Status   Specimen Description BLOOD RIGHT HAND  Final   Special Requests IN PEDIATRIC BOTTLE Blood Culture adequate volume  Final   Culture NO GROWTH 2 DAYS  Final   Report Status PENDING  Incomplete  Culture, blood (Routine X 2) w Reflex to ID Panel     Status: Abnormal (Preliminary result)   Collection Time: 02/18/17  2:55 PM  Result Value Ref Range Status   Specimen Description BLOOD RIGHT  WRIST  Final   Special Requests IN PEDIATRIC BOTTLE Blood Culture adequate volume  Final   Culture  Setup Time   Final    IN PEDIATRIC BOTTLE GRAM POSITIVE RODS CRITICAL RESULT CALLED TO, READ BACK BY AND VERIFIED WITH: L FOLTANSKI,PHARMD AT 0730 02/19/17 BY L BENFIELD    Culture (A)  Final    BACILLUS SPECIES Standardized susceptibility testing for this organism is not available. CULTURE REINCUBATED FOR BETTER GROWTH    Report Status PENDING  Incomplete  Blood Culture ID Panel (Reflexed)     Status: Abnormal   Collection Time: 02/18/17  2:55 PM  Result Value Ref  Range Status   Enterococcus species DETECTED (A) NOT DETECTED Final    Comment: CRITICAL RESULT CALLED TO, READ BACK BY AND VERIFIED WITH: L FOLTANSKI,PHARMD AT 0730 02/19/17 BY L BENFIELD    Vancomycin resistance NOT DETECTED NOT DETECTED Final   Listeria monocytogenes NOT DETECTED NOT DETECTED Final   Staphylococcus species NOT DETECTED NOT DETECTED Final   Staphylococcus aureus NOT DETECTED NOT DETECTED Final   Streptococcus species NOT DETECTED NOT DETECTED Final   Streptococcus agalactiae NOT DETECTED NOT DETECTED Final   Streptococcus pneumoniae NOT DETECTED NOT DETECTED Final   Streptococcus pyogenes NOT DETECTED NOT DETECTED Final   Acinetobacter baumannii NOT DETECTED NOT DETECTED Final   Enterobacteriaceae species NOT DETECTED NOT DETECTED Final   Enterobacter cloacae complex NOT DETECTED NOT DETECTED Final   Escherichia coli NOT DETECTED NOT DETECTED Final   Klebsiella oxytoca NOT DETECTED NOT DETECTED Final   Klebsiella pneumoniae NOT DETECTED NOT DETECTED Final   Proteus species NOT DETECTED NOT DETECTED Final   Serratia marcescens NOT DETECTED NOT DETECTED Final   Haemophilus influenzae NOT DETECTED NOT DETECTED Final   Neisseria meningitidis NOT DETECTED NOT DETECTED Final   Pseudomonas aeruginosa NOT DETECTED NOT DETECTED Final   Candida albicans NOT DETECTED NOT DETECTED Final   Candida glabrata NOT  DETECTED NOT DETECTED Final   Candida krusei NOT DETECTED NOT DETECTED Final   Candida parapsilosis NOT DETECTED NOT DETECTED Final   Candida tropicalis NOT DETECTED NOT DETECTED Final    Impression/Plan:  1. Bacillus blood culture - 1/2, normal wbc, no fever.  C/w a contaminate rather than true pathogen.  Enterococcus in BCID seems to be some cross-reactivity.   I will d/c vancomycin  2.access - repeat blood cultures sent and if it remains negative, ok for picc line.    I will sign off, thanks

## 2017-02-20 NOTE — Progress Notes (Addendum)
Follow up - Trauma Critical Care  Patient Details:    Peter [name] is an 56 y.o. male.  Lines/tubes : Airway 8 mm (Active)  Secured at (cm) 24 cm 02/20/2017  8:00 AM  Measured From Lips 02/20/2017  8:00 AM  Secured Location Right 02/20/2017  7:46 AM  Secured By [employer] 02/20/2017  7:46 AM  Tube Holder Repositioned Yes 02/20/2017  7:46 AM  Cuff Pressure (cm H2O) 26 cm H2O 02/20/2017  7:46 AM  Site Condition Dry 02/20/2017  8:00 AM     NG/OG Tube Orogastric 16 Fr. Right mouth Xray (Active)  Cm Marking at Nare/Corner of Mouth (if applicable) 61 cm 10/19/7780  8:00 AM  Site Assessment Clean;Dry;Intact 02/20/2017  8:00 AM  Ongoing Placement Verification No change in cm markings or external length of tube from initial placement;No change in respiratory status 02/20/2017  8:00 AM  Status Infusing tube feed 02/20/2017  8:00 AM  Drainage Appearance Green 02/18/2017  5:00 AM  Intake (mL) 60 mL 02/18/2017  6:00 AM  Output (mL) 150 mL 02/13/2017 10:00 PM     Urethral Catheter [name], EMT Double-lumen 16 Fr. (Active)  Indication for Insertion or Continuance of Catheter Bladder outlet obstruction / other urologic reason 02/20/2017  8:00 AM  Site Assessment Edema;Swelling 02/20/2017  8:00 AM  Catheter Maintenance Bag below level of bladder;Catheter secured;Drainage bag/tubing not touching floor;No dependent loops;Seal intact 02/20/2017  8:00 AM  Collection Container Standard drainage bag 02/20/2017  8:00 AM  Securement Method Leg strap 02/20/2017  8:00 AM  Urinary Catheter Interventions Unclamped 02/20/2017  8:00 AM  Output (mL) 140 mL 02/20/2017  8:00 AM    Microbiology/Sepsis markers: Results for orders placed or performed during the hospital encounter of 02/07/17  MRSA PCR Screening     Status: None   Collection Time: 02/08/17  4:08 AM  Result Value Ref Range Status   MRSA by PCR NEGATIVE NEGATIVE Final    Comment:        The GeneXpert MRSA Assay (FDA approved for NASAL specimens only), is one component  of a comprehensive MRSA colonization surveillance program. It is not intended to diagnose MRSA infection nor to guide or monitor treatment for MRSA infections.   Culture, respiratory (NON-Expectorated)     Status: None   Collection Time: 02/10/17 10:59 AM  Result Value Ref Range Status   Specimen Description TRACHEAL ASPIRATE  Final   Special Requests Normal  Final   Gram Stain   Final    ABUNDANT WBC PRESENT, PREDOMINANTLY PMN RARE SQUAMOUS EPITHELIAL CELLS PRESENT ABUNDANT GRAM NEGATIVE COCCI IN PAIRS ABUNDANT GRAM NEGATIVE RODS MODERATE GRAM POSITIVE COCCI IN CHAINS    Culture ABUNDANT Consistent with normal respiratory flora.  Final   Report Status 02/12/2017 FINAL  Final  Culture, blood (Routine X 2) w Reflex to ID Panel     Status: Abnormal   Collection Time: 02/11/17  3:25 PM  Result Value Ref Range Status   Specimen Description BLOOD RIGHT HAND  Final   Special Requests   Final    BOTTLES DRAWN AEROBIC ONLY Blood Culture adequate volume   Culture  Setup Time   Final    GRAM NEGATIVE RODS AEROBIC BOTTLE ONLY CRITICAL VALUE NOTED.  VALUE IS CONSISTENT WITH PREVIOUSLY REPORTED AND CALLED VALUE.    Culture (A)  Final    KLEBSIELLA OXYTOCA SUSCEPTIBILITIES PERFORMED ON PREVIOUS CULTURE WITHIN THE LAST 5 DAYS.    Report Status 02/17/2017 FINAL  Final  Culture, blood (Routine  X 2) w Reflex to ID Panel     Status: Abnormal   Collection Time: 02/11/17  3:25 PM  Result Value Ref Range Status   Specimen Description BLOOD RIGHT HAND  Final   Special Requests   Final    BOTTLES DRAWN AEROBIC ONLY Blood Culture adequate volume   Culture  Setup Time   Final    GRAM NEGATIVE RODS AEROBIC BOTTLE ONLY CRITICAL RESULT CALLED TO, READ BACK BY AND VERIFIED WITH: J.LEDFORD, PHARMD 02/15/17 0632 L.CHAMPION    Culture KLEBSIELLA OXYTOCA (A)  Final   Report Status 02/17/2017 FINAL  Final   Organism ID, Bacteria KLEBSIELLA OXYTOCA  Final      Susceptibility   Klebsiella oxytoca -  MIC*    AMPICILLIN >=32 RESISTANT Resistant     CEFAZOLIN >=64 RESISTANT Resistant     CEFEPIME <=1 SENSITIVE Sensitive     CEFTAZIDIME <=1 SENSITIVE Sensitive     CEFTRIAXONE <=1 SENSITIVE Sensitive     CIPROFLOXACIN <=0.25 SENSITIVE Sensitive     GENTAMICIN <=1 SENSITIVE Sensitive     IMIPENEM <=0.25 SENSITIVE Sensitive     TRIMETH/SULFA <=20 SENSITIVE Sensitive     AMPICILLIN/SULBACTAM 8 SENSITIVE Sensitive     PIP/TAZO <=4 SENSITIVE Sensitive     Extended ESBL NEGATIVE Sensitive     * KLEBSIELLA OXYTOCA  Blood Culture ID Panel (Reflexed)     Status: Abnormal   Collection Time: 02/11/17  3:25 PM  Result Value Ref Range Status   Enterococcus species NOT DETECTED NOT DETECTED Final   Listeria monocytogenes NOT DETECTED NOT DETECTED Final   Staphylococcus species NOT DETECTED NOT DETECTED Final   Staphylococcus aureus NOT DETECTED NOT DETECTED Final   Streptococcus species NOT DETECTED NOT DETECTED Final   Streptococcus agalactiae NOT DETECTED NOT DETECTED Final   Streptococcus pneumoniae NOT DETECTED NOT DETECTED Final   Streptococcus pyogenes NOT DETECTED NOT DETECTED Final   Acinetobacter baumannii NOT DETECTED NOT DETECTED Final   Enterobacteriaceae species DETECTED (A) NOT DETECTED Final    Comment: Enterobacteriaceae represent a large family of gram-negative bacteria, not a single organism. CRITICAL RESULT CALLED TO, READ BACK BY AND VERIFIED WITH: J.LEDFORD, PHARMD 02/15/17 0632 L.CHAMPION    Enterobacter cloacae complex NOT DETECTED NOT DETECTED Final   Escherichia coli NOT DETECTED NOT DETECTED Final   Klebsiella oxytoca DETECTED (A) NOT DETECTED Final    Comment: CRITICAL RESULT CALLED TO, READ BACK BY AND VERIFIED WITH: J.LEDFORD, PHARMD 02/15/17 0632 L.CHAMPION    Klebsiella pneumoniae NOT DETECTED NOT DETECTED Final   Proteus species NOT DETECTED NOT DETECTED Final   Serratia marcescens NOT DETECTED NOT DETECTED Final   Carbapenem resistance NOT DETECTED NOT  DETECTED Final   Haemophilus influenzae NOT DETECTED NOT DETECTED Final   Neisseria meningitidis NOT DETECTED NOT DETECTED Final   Pseudomonas aeruginosa NOT DETECTED NOT DETECTED Final   Candida albicans NOT DETECTED NOT DETECTED Final   Candida glabrata NOT DETECTED NOT DETECTED Final   Candida krusei NOT DETECTED NOT DETECTED Final   Candida parapsilosis NOT DETECTED NOT DETECTED Final   Candida tropicalis NOT DETECTED NOT DETECTED Final  Surgical PCR screen     Status: None   Collection Time: 02/13/17  6:30 AM  Result Value Ref Range Status   MRSA, PCR NEGATIVE NEGATIVE Final   Staphylococcus aureus NEGATIVE NEGATIVE Final    Comment:        The Xpert SA Assay (FDA approved for NASAL specimens in patients over 63 years of age), is one  component of a comprehensive surveillance program.  Test performance has been validated by [facility] for patients greater than or equal to 68 year old. It is not intended to diagnose infection nor to guide or monitor treatment.   Culture, blood (Routine X 2) w Reflex to ID Panel     Status: None (Preliminary result)   Collection Time: 02/18/17  2:52 PM  Result Value Ref Range Status   Specimen Description BLOOD RIGHT HAND  Final   Special Requests IN PEDIATRIC BOTTLE Blood Culture adequate volume  Final   Culture NO GROWTH < 24 HOURS  Final   Report Status PENDING  Incomplete  Culture, blood (Routine X 2) w Reflex to ID Panel     Status: None (Preliminary result)   Collection Time: 02/18/17  2:55 PM  Result Value Ref Range Status   Specimen Description BLOOD RIGHT WRIST  Final   Special Requests IN PEDIATRIC BOTTLE Blood Culture adequate volume  Final   Culture  Setup Time   Final    IN PEDIATRIC BOTTLE GRAM POSITIVE RODS CRITICAL RESULT CALLED TO, READ BACK BY AND VERIFIED WITH: L FOLTANSKI,PHARMD AT 0730 02/19/17 BY L BENFIELD    Culture GRAM POSITIVE RODS  Final   Report Status PENDING  Incomplete  Blood Culture ID Panel (Reflexed)      Status: Abnormal   Collection Time: 02/18/17  2:55 PM  Result Value Ref Range Status   Enterococcus species DETECTED (A) NOT DETECTED Final    Comment: CRITICAL RESULT CALLED TO, READ BACK BY AND VERIFIED WITH: L FOLTANSKI,PHARMD AT 0730 02/19/17 BY L BENFIELD    Vancomycin resistance NOT DETECTED NOT DETECTED Final   Listeria monocytogenes NOT DETECTED NOT DETECTED Final   Staphylococcus species NOT DETECTED NOT DETECTED Final   Staphylococcus aureus NOT DETECTED NOT DETECTED Final   Streptococcus species NOT DETECTED NOT DETECTED Final   Streptococcus agalactiae NOT DETECTED NOT DETECTED Final   Streptococcus pneumoniae NOT DETECTED NOT DETECTED Final   Streptococcus pyogenes NOT DETECTED NOT DETECTED Final   Acinetobacter baumannii NOT DETECTED NOT DETECTED Final   Enterobacteriaceae species NOT DETECTED NOT DETECTED Final   Enterobacter cloacae complex NOT DETECTED NOT DETECTED Final   Escherichia coli NOT DETECTED NOT DETECTED Final   Klebsiella oxytoca NOT DETECTED NOT DETECTED Final   Klebsiella pneumoniae NOT DETECTED NOT DETECTED Final   Proteus species NOT DETECTED NOT DETECTED Final   Serratia marcescens NOT DETECTED NOT DETECTED Final   Haemophilus influenzae NOT DETECTED NOT DETECTED Final   Neisseria meningitidis NOT DETECTED NOT DETECTED Final   Pseudomonas aeruginosa NOT DETECTED NOT DETECTED Final   Candida albicans NOT DETECTED NOT DETECTED Final   Candida glabrata NOT DETECTED NOT DETECTED Final   Candida krusei NOT DETECTED NOT DETECTED Final   Candida parapsilosis NOT DETECTED NOT DETECTED Final   Candida tropicalis NOT DETECTED NOT DETECTED Final    Anti-infectives:  Anti-infectives    Start     Dose/Rate Route Frequency Ordered Stop   02/19/17 2200  vancomycin (VANCOCIN) IVPB 750 mg/150 ml premix     750 mg 150 mL/hr over 60 Minutes Intravenous Every 12 hours 02/19/17 0807     02/19/17 0900  vancomycin (VANCOCIN) 2,000 mg in sodium chloride 0.9 % 500 mL  IVPB     2,000 mg 250 mL/hr over 120 Minutes Intravenous  Once 02/19/17 0807 02/19/17 1151   02/17/17 1100  cefTRIAXone (ROCEPHIN) 2 g in dextrose 5 % 50 mL IVPB  Status:  Discontinued  2 g 100 mL/hr over 30 Minutes Intravenous Every 24 hours 02/17/17 1041 02/19/17 0801   02/14/17 1500  piperacillin-tazobactam (ZOSYN) IVPB 3.375 g  Status:  Discontinued     3.375 g 12.5 mL/hr over 240 Minutes Intravenous Every 8 hours 02/14/17 1351 02/17/17 1041   02/11/17 1000  piperacillin-tazobactam (ZOSYN) IVPB 3.375 g  Status:  Discontinued     3.375 g 12.5 mL/hr over 240 Minutes Intravenous Every 8 hours 02/11/17 0858 02/14/17 1351   02/11/17 1000  vancomycin (VANCOCIN) IVPB 750 mg/150 ml premix  Status:  Discontinued     750 mg 150 mL/hr over 60 Minutes Intravenous Every 12 hours 02/11/17 0858 02/14/17 0844   02/07/17 2000  ceFAZolin (ANCEF) IVPB 1 g/50 mL premix     1 g 100 mL/hr over 30 Minutes Intravenous Every 8 hours 02/07/17 1416 02/08/17 1543      Best Practice/Protocols:  VTE Prophylaxis: Lovenox (prophylaxtic dose) Continous Sedation  Consults: Treatment Team:  Altamese Arthur, MD    Studies:    Events:  Subjective:    Overnight Issues:   Objective:  Vital signs for last 24 hours: Temp:  [97.5 F (36.4 C)-99.9 F (37.7 C)] 99.9 F (37.7 C) (09/03 0900) Pulse Rate:  [37-100] 87 (09/03 0900) Resp:  [20-27] 20 (09/03 0900) BP: (109-188)/(55-78) 127/67 (09/03 0900) SpO2:  [96 %-100 %] 97 % (09/03 0900) FiO2 (%):  [30 %-40 %] 30 % (09/03 0900) Weight:  [106.3 kg (234 lb 5.6 oz)] 106.3 kg (234 lb 5.6 oz) (09/03 0307)  Hemodynamic parameters for last 24 hours:    Intake/Output from previous day: 09/02 0701 - 09/03 0700 In: 5134.8 [I.V.:2099.8; NG/GT:2280; IV Piggyback:755] Out: 1610 [Urine:1625; Chest Tube:50]  Intake/Output this shift: Total I/O In: 283.8 [I.V.:163.8; NG/GT:120] Out: 140 [Urine:140]  Vent settings for last 24 hours: Vent Mode: PRVC FiO2  (%):  [30 %-40 %] 30 % Set Rate:  [20 bmp] 20 bmp Vt Set:  [580 mL] 580 mL PEEP:  [5 cmH20] 5 cmH20 Pressure Support:  [12 cmH20] 12 cmH20 Plateau Pressure:  [21 cmH20-25 cmH20] 25 cmH20  Physical Exam:  General: on vent Neuro: now sedated, RN reports F/C this AM HEENT/Neck: ETT and collar Resp: clear to auscultation bilaterally CVS: regular rate and rhythm, S1, S2 normal, no murmur, click, rub or gallop GI: soft, nontender, BS WNL, no r/g Extremities: edema 3+  Results for orders placed or performed during the hospital encounter of 02/07/17 (from the past 24 hour(s))  Glucose, capillary     Status: Abnormal   Collection Time: 02/19/17 12:15 PM  Result Value Ref Range   Glucose-Capillary 149 (H) 65 - 99 mg/dL  Glucose, capillary     Status: Abnormal   Collection Time: 02/19/17  4:20 PM  Result Value Ref Range   Glucose-Capillary 179 (H) 65 - 99 mg/dL  Glucose, capillary     Status: Abnormal   Collection Time: 02/19/17  8:08 PM  Result Value Ref Range   Glucose-Capillary 196 (H) 65 - 99 mg/dL  Glucose, capillary     Status: Abnormal   Collection Time: 02/20/17 12:01 AM  Result Value Ref Range   Glucose-Capillary 207 (H) 65 - 99 mg/dL  Glucose, capillary     Status: Abnormal   Collection Time: 02/20/17  3:24 AM  Result Value Ref Range   Glucose-Capillary 199 (H) 65 - 99 mg/dL  Glucose, capillary     Status: Abnormal   Collection Time: 02/20/17  8:33 AM  Result Value Ref  Range   Glucose-Capillary 176 (H) 65 - 99 mg/dL    Assessment & Plan: Present on Admission: . Multiple fractures of ribs, bilateral, initial encounter for closed fracture . Multiple closed anterior-posterior compression fractures of pelvis (HCC)    LOS: 13 days   Additional comments:I reviewed the patient's new clinical lab test results. 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 Marcelino Scot 8/21 , S/P symphysis plating by Dr. Marcelino Scot 8/27,  - NWB BLE for 8 weeks, no ROM restrictions  Vent dependent acute hypoxic resp failure- cont vent support, PEEP 5, weaning as tolerated, increase Klon/Sero to assist FEN- 0.45NS and free water for hypernatremia, Miralax, add dulc supp IDDM- lantus 10u QHS, ICU glycemic control protocol ID- ceftriaxone for Klebsiella bactermia, change TLC to PICC once blood Cx cleared Renal - AKI improving VTE- PAS, Lovenox Elevated LFTs- direct hyperbilirubinemia from resorbtion of hematomas improving Dispo- ICU, I spoke with family at the bedside Critical Care Total Time*: 33 Minutes  Georganna Skeans, MD, MPH, FACS Trauma: [phone] General Surgery: [phone]  02/20/2017  *Care during the described time interval was provided by me. I have reviewed this patient's available data, including medical history, events of note, physical examination and test results as part of my evaluation.  Patient ID: Peter [name], male   DOB: [birth year], 56 y.o.   MRN: [id number]
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
## 2017-02-21 ENCOUNTER — Inpatient Hospital Stay (HOSPITAL_COMMUNITY): Payer: BLUE CROSS/BLUE SHIELD

## 2017-02-21 LAB — CBC
HCT: 25.4 % — ABNORMAL LOW (ref 39.0–52.0)
Hemoglobin: 8.3 g/dL — ABNORMAL LOW (ref 13.0–17.0)
MCH: 29.1 pg (ref 26.0–34.0)
MCHC: 32.7 g/dL (ref 30.0–36.0)
MCV: 89.1 fL (ref 78.0–100.0)
PLATELETS: 514 10*3/uL — AB (ref 150–400)
RBC: 2.85 MIL/uL — AB (ref 4.22–5.81)
RDW: 15.9 % — ABNORMAL HIGH (ref 11.5–15.5)
WBC: 11.8 10*3/uL — AB (ref 4.0–10.5)

## 2017-02-21 LAB — BASIC METABOLIC PANEL
ANION GAP: 4 — AB (ref 5–15)
BUN: 56 mg/dL — AB (ref 6–20)
CO2: 23 mmol/L (ref 22–32)
Calcium: 8.2 mg/dL — ABNORMAL LOW (ref 8.9–10.3)
Chloride: 119 mmol/L — ABNORMAL HIGH (ref 101–111)
Creatinine, Ser: 1.46 mg/dL — ABNORMAL HIGH (ref 0.61–1.24)
GFR calc Af Amer: >60 mL/min (ref >60)
GFR, EST NON AFRICAN AMERICAN: 52 mL/min — AB (ref >60)
Glucose, Bld: 213 mg/dL — ABNORMAL HIGH (ref 65–99)
POTASSIUM: 5 mmol/L (ref 3.5–5.1)
SODIUM: 146 mmol/L — AB (ref 135–145)

## 2017-02-21 LAB — GLUCOSE, CAPILLARY
GLUCOSE-CAPILLARY: 232 mg/dL — AB (ref 65–99)
Glucose-Capillary: 124 mg/dL — ABNORMAL HIGH (ref 65–99)
Glucose-Capillary: 162 mg/dL — ABNORMAL HIGH (ref 65–99)
Glucose-Capillary: 168 mg/dL — ABNORMAL HIGH (ref 65–99)
Glucose-Capillary: 190 mg/dL — ABNORMAL HIGH (ref 65–99)
Glucose-Capillary: 216 mg/dL — ABNORMAL HIGH (ref 65–99)

## 2017-02-21 LAB — HEPATIC FUNCTION PANEL
ALT: 52 U/L (ref 17–63)
AST: 35 U/L (ref 15–41)
Albumin: 1.5 g/dL — ABNORMAL LOW (ref 3.5–5.0)
Alkaline Phosphatase: 394 U/L — ABNORMAL HIGH (ref 38–126)
BILIRUBIN DIRECT: 5.3 mg/dL — AB (ref 0.1–0.5)
BILIRUBIN INDIRECT: 3.3 mg/dL — AB (ref 0.3–0.9)
BILIRUBIN TOTAL: 8.6 mg/dL — AB (ref 0.3–1.2)
Total Protein: 5.2 g/dL — ABNORMAL LOW (ref 6.5–8.1)

## 2017-02-21 LAB — AMMONIA: Ammonia: 41 umol/L — ABNORMAL HIGH (ref 9–35)

## 2017-02-21 LAB — CULTURE, BLOOD (ROUTINE X 2): Special Requests: ADEQUATE

## 2017-02-21 MED ORDER — FUROSEMIDE 10 MG/ML IJ SOLN
20.0000 mg | Freq: Once | INTRAMUSCULAR | Status: AC
  Administered 2017-02-21: 20 mg via INTRAVENOUS
  Filled 2017-02-21: qty 2

## 2017-02-21 NOTE — Progress Notes (Signed)
Inpatient Diabetes Program Recommendations  AACE/ADA: New Consensus Statement on Inpatient Glycemic Control (2015)  Target Ranges:  Prepandial:   less than 140 mg/dL      Peak postprandial:   less than 180 mg/dL (1-2 hours)      Critically ill patients:  140 - 180 mg/dL   Results for Peter [name], Peter [name] (MRN [id number]) as of 02/21/2017 10:15  Ref. Range 02/20/2017 08:33 02/20/2017 12:20 02/20/2017 17:01 02/20/2017 20:23 02/20/2017 23:44 02/21/2017 03:58 02/21/2017 09:15  Glucose-Capillary Latest Ref Range: 65 - 99 mg/dL 176 (H) 188 (H) 191 (H) 189 (H) 221 (H) 232 (H) 216 (H)   Inpatient Diabetes Program Recommendations:    Glucose trends increased into the 200's. Consider Novolog 3 units Q4 hours tube feed coverage.  Thanks,  Tama Headings RN, MSN, [facility] Inpatient Diabetes Coordinator Team Pager [phone] (8a-5p)
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
## 2017-02-21 NOTE — Progress Notes (Signed)
Follow up - Trauma Critical Care  Patient Details:    Peter [name] is an 56 y.o. male.  Lines/tubes : Airway 8 mm (Active)  Secured at (cm) 24 cm 02/20/2017  8:00 AM  Measured From Lips 02/20/2017  8:00 AM  Secured Location Right 02/20/2017  7:46 AM  Secured By [employer] 02/20/2017  7:46 AM  Tube Holder Repositioned Yes 02/20/2017  7:46 AM  Cuff Pressure (cm H2O) 26 cm H2O 02/20/2017  7:46 AM  Site Condition Dry 02/20/2017  8:00 AM     NG/OG Tube Orogastric 16 Fr. Right mouth Xray (Active)  Cm Marking at Nare/Corner of Mouth (if applicable) 61 cm 12/23/6431  8:00 AM  Site Assessment Clean;Dry;Intact 02/20/2017  8:00 AM  Ongoing Placement Verification No change in cm markings or external length of tube from initial placement;No change in respiratory status 02/20/2017  8:00 AM  Status Infusing tube feed 02/20/2017  8:00 AM  Drainage Appearance Green 02/18/2017  5:00 AM  Intake (mL) 60 mL 02/18/2017  6:00 AM  Output (mL) 150 mL 02/13/2017 10:00 PM     Urethral Catheter [name], EMT Double-lumen 16 Fr. (Active)  Indication for Insertion or Continuance of Catheter Bladder outlet obstruction / other urologic reason 02/20/2017  8:00 AM  Site Assessment Edema;Swelling 02/20/2017  8:00 AM  Catheter Maintenance Bag below level of bladder;Catheter secured;Drainage bag/tubing not touching floor;No dependent loops;Seal intact 02/20/2017  8:00 AM  Collection Container Standard drainage bag 02/20/2017  8:00 AM  Securement Method Leg strap 02/20/2017  8:00 AM  Urinary Catheter Interventions Unclamped 02/20/2017  8:00 AM  Output (mL) 140 mL 02/20/2017  8:00 AM    Microbiology/Sepsis markers: Results for orders placed or performed during the hospital encounter of 02/07/17  MRSA PCR Screening     Status: None   Collection Time: 02/08/17  4:08 AM  Result Value Ref Range Status   MRSA by PCR NEGATIVE NEGATIVE Final    Comment:        The GeneXpert MRSA Assay (FDA approved for NASAL specimens only), is one component  of a comprehensive MRSA colonization surveillance program. It is not intended to diagnose MRSA infection nor to guide or monitor treatment for MRSA infections.   Culture, respiratory (NON-Expectorated)     Status: None   Collection Time: 02/10/17 10:59 AM  Result Value Ref Range Status   Specimen Description TRACHEAL ASPIRATE  Final   Special Requests Normal  Final   Gram Stain   Final    ABUNDANT WBC PRESENT, PREDOMINANTLY PMN RARE SQUAMOUS EPITHELIAL CELLS PRESENT ABUNDANT GRAM NEGATIVE COCCI IN PAIRS ABUNDANT GRAM NEGATIVE RODS MODERATE GRAM POSITIVE COCCI IN CHAINS    Culture ABUNDANT Consistent with normal respiratory flora.  Final   Report Status 02/12/2017 FINAL  Final  Culture, blood (Routine X 2) w Reflex to ID Panel     Status: Abnormal   Collection Time: 02/11/17  3:25 PM  Result Value Ref Range Status   Specimen Description BLOOD RIGHT HAND  Final   Special Requests   Final    BOTTLES DRAWN AEROBIC ONLY Blood Culture adequate volume   Culture  Setup Time   Final    GRAM NEGATIVE RODS AEROBIC BOTTLE ONLY CRITICAL VALUE NOTED.  VALUE IS CONSISTENT WITH PREVIOUSLY REPORTED AND CALLED VALUE.    Culture (A)  Final    KLEBSIELLA OXYTOCA SUSCEPTIBILITIES PERFORMED ON PREVIOUS CULTURE WITHIN THE LAST 5 DAYS.    Report Status 02/17/2017 FINAL  Final  Culture, blood (Routine  X 2) w Reflex to ID Panel     Status: Abnormal   Collection Time: 02/11/17  3:25 PM  Result Value Ref Range Status   Specimen Description BLOOD RIGHT HAND  Final   Special Requests   Final    BOTTLES DRAWN AEROBIC ONLY Blood Culture adequate volume   Culture  Setup Time   Final    GRAM NEGATIVE RODS AEROBIC BOTTLE ONLY CRITICAL RESULT CALLED TO, READ BACK BY AND VERIFIED WITH: J.LEDFORD, PHARMD 02/15/17 0632 L.CHAMPION    Culture KLEBSIELLA OXYTOCA (A)  Final   Report Status 02/17/2017 FINAL  Final   Organism ID, Bacteria KLEBSIELLA OXYTOCA  Final      Susceptibility   Klebsiella oxytoca -  MIC*    AMPICILLIN >=32 RESISTANT Resistant     CEFAZOLIN >=64 RESISTANT Resistant     CEFEPIME <=1 SENSITIVE Sensitive     CEFTAZIDIME <=1 SENSITIVE Sensitive     CEFTRIAXONE <=1 SENSITIVE Sensitive     CIPROFLOXACIN <=0.25 SENSITIVE Sensitive     GENTAMICIN <=1 SENSITIVE Sensitive     IMIPENEM <=0.25 SENSITIVE Sensitive     TRIMETH/SULFA <=20 SENSITIVE Sensitive     AMPICILLIN/SULBACTAM 8 SENSITIVE Sensitive     PIP/TAZO <=4 SENSITIVE Sensitive     Extended ESBL NEGATIVE Sensitive     * KLEBSIELLA OXYTOCA  Blood Culture ID Panel (Reflexed)     Status: Abnormal   Collection Time: 02/11/17  3:25 PM  Result Value Ref Range Status   Enterococcus species NOT DETECTED NOT DETECTED Final   Listeria monocytogenes NOT DETECTED NOT DETECTED Final   Staphylococcus species NOT DETECTED NOT DETECTED Final   Staphylococcus aureus NOT DETECTED NOT DETECTED Final   Streptococcus species NOT DETECTED NOT DETECTED Final   Streptococcus agalactiae NOT DETECTED NOT DETECTED Final   Streptococcus pneumoniae NOT DETECTED NOT DETECTED Final   Streptococcus pyogenes NOT DETECTED NOT DETECTED Final   Acinetobacter baumannii NOT DETECTED NOT DETECTED Final   Enterobacteriaceae species DETECTED (A) NOT DETECTED Final    Comment: Enterobacteriaceae represent a large family of gram-negative bacteria, not a single organism. CRITICAL RESULT CALLED TO, READ BACK BY AND VERIFIED WITH: J.LEDFORD, PHARMD 02/15/17 0632 L.CHAMPION    Enterobacter cloacae complex NOT DETECTED NOT DETECTED Final   Escherichia coli NOT DETECTED NOT DETECTED Final   Klebsiella oxytoca DETECTED (A) NOT DETECTED Final    Comment: CRITICAL RESULT CALLED TO, READ BACK BY AND VERIFIED WITH: J.LEDFORD, PHARMD 02/15/17 0632 L.CHAMPION    Klebsiella pneumoniae NOT DETECTED NOT DETECTED Final   Proteus species NOT DETECTED NOT DETECTED Final   Serratia marcescens NOT DETECTED NOT DETECTED Final   Carbapenem resistance NOT DETECTED NOT  DETECTED Final   Haemophilus influenzae NOT DETECTED NOT DETECTED Final   Neisseria meningitidis NOT DETECTED NOT DETECTED Final   Pseudomonas aeruginosa NOT DETECTED NOT DETECTED Final   Candida albicans NOT DETECTED NOT DETECTED Final   Candida glabrata NOT DETECTED NOT DETECTED Final   Candida krusei NOT DETECTED NOT DETECTED Final   Candida parapsilosis NOT DETECTED NOT DETECTED Final   Candida tropicalis NOT DETECTED NOT DETECTED Final  Surgical PCR screen     Status: None   Collection Time: 02/13/17  6:30 AM  Result Value Ref Range Status   MRSA, PCR NEGATIVE NEGATIVE Final   Staphylococcus aureus NEGATIVE NEGATIVE Final    Comment:        The Xpert SA Assay (FDA approved for NASAL specimens in patients over 87 years of age), is one  component of a comprehensive surveillance program.  Test performance has been validated by [facility] for patients greater than or equal to 67 year old. It is not intended to diagnose infection nor to guide or monitor treatment.   Culture, blood (Routine X 2) w Reflex to ID Panel     Status: None (Preliminary result)   Collection Time: 02/18/17  2:52 PM  Result Value Ref Range Status   Specimen Description BLOOD RIGHT HAND  Final   Special Requests IN PEDIATRIC BOTTLE Blood Culture adequate volume  Final   Culture NO GROWTH 2 DAYS  Final   Report Status PENDING  Incomplete  Culture, blood (Routine X 2) w Reflex to ID Panel     Status: Abnormal   Collection Time: 02/18/17  2:55 PM  Result Value Ref Range Status   Specimen Description BLOOD RIGHT WRIST  Final   Special Requests IN PEDIATRIC BOTTLE Blood Culture adequate volume  Final   Culture  Setup Time   Final    IN PEDIATRIC BOTTLE GRAM POSITIVE RODS CRITICAL RESULT CALLED TO, READ BACK BY AND VERIFIED WITH: L FOLTANSKI,PHARMD AT 0730 02/19/17 BY L BENFIELD    Culture (A)  Final    BACILLUS SPECIES Standardized susceptibility testing for this organism is not available. ENTEROCOCCUS  DETECTED ON BCID, NOT RECOVERED IN CULTURE    Report Status 02/21/2017 FINAL  Final  Blood Culture ID Panel (Reflexed)     Status: Abnormal   Collection Time: 02/18/17  2:55 PM  Result Value Ref Range Status   Enterococcus species DETECTED (A) NOT DETECTED Final    Comment: CRITICAL RESULT CALLED TO, READ BACK BY AND VERIFIED WITH: L FOLTANSKI,PHARMD AT 0730 02/19/17 BY L BENFIELD    Vancomycin resistance NOT DETECTED NOT DETECTED Final   Listeria monocytogenes NOT DETECTED NOT DETECTED Final   Staphylococcus species NOT DETECTED NOT DETECTED Final   Staphylococcus aureus NOT DETECTED NOT DETECTED Final   Streptococcus species NOT DETECTED NOT DETECTED Final   Streptococcus agalactiae NOT DETECTED NOT DETECTED Final   Streptococcus pneumoniae NOT DETECTED NOT DETECTED Final   Streptococcus pyogenes NOT DETECTED NOT DETECTED Final   Acinetobacter baumannii NOT DETECTED NOT DETECTED Final   Enterobacteriaceae species NOT DETECTED NOT DETECTED Final   Enterobacter cloacae complex NOT DETECTED NOT DETECTED Final   Escherichia coli NOT DETECTED NOT DETECTED Final   Klebsiella oxytoca NOT DETECTED NOT DETECTED Final   Klebsiella pneumoniae NOT DETECTED NOT DETECTED Final   Proteus species NOT DETECTED NOT DETECTED Final   Serratia marcescens NOT DETECTED NOT DETECTED Final   Haemophilus influenzae NOT DETECTED NOT DETECTED Final   Neisseria meningitidis NOT DETECTED NOT DETECTED Final   Pseudomonas aeruginosa NOT DETECTED NOT DETECTED Final   Candida albicans NOT DETECTED NOT DETECTED Final   Candida glabrata NOT DETECTED NOT DETECTED Final   Candida krusei NOT DETECTED NOT DETECTED Final   Candida parapsilosis NOT DETECTED NOT DETECTED Final   Candida tropicalis NOT DETECTED NOT DETECTED Final    Anti-infectives:  Anti-infectives    Start     Dose/Rate Route Frequency Ordered Stop   02/19/17 2200  vancomycin (VANCOCIN) IVPB 750 mg/150 ml premix  Status:  Discontinued     750 mg 150  mL/hr over 60 Minutes Intravenous Every 12 hours 02/19/17 0807 02/20/17 1150   02/19/17 0900  vancomycin (VANCOCIN) 2,000 mg in sodium chloride 0.9 % 500 mL IVPB     2,000 mg 250 mL/hr over 120 Minutes Intravenous  Once 02/19/17  6967 02/19/17 1151   02/17/17 1100  cefTRIAXone (ROCEPHIN) 2 g in dextrose 5 % 50 mL IVPB  Status:  Discontinued     2 g 100 mL/hr over 30 Minutes Intravenous Every 24 hours 02/17/17 1041 02/19/17 0801   02/14/17 1500  piperacillin-tazobactam (ZOSYN) IVPB 3.375 g  Status:  Discontinued     3.375 g 12.5 mL/hr over 240 Minutes Intravenous Every 8 hours 02/14/17 1351 02/17/17 1041   02/11/17 1000  piperacillin-tazobactam (ZOSYN) IVPB 3.375 g  Status:  Discontinued     3.375 g 12.5 mL/hr over 240 Minutes Intravenous Every 8 hours 02/11/17 0858 02/14/17 1351   02/11/17 1000  vancomycin (VANCOCIN) IVPB 750 mg/150 ml premix  Status:  Discontinued     750 mg 150 mL/hr over 60 Minutes Intravenous Every 12 hours 02/11/17 0858 02/14/17 0844   02/07/17 2000  ceFAZolin (ANCEF) IVPB 1 g/50 mL premix     1 g 100 mL/hr over 30 Minutes Intravenous Every 8 hours 02/07/17 1416 02/08/17 1543      Best Practice/Protocols:  VTE Prophylaxis: Lovenox (prophylaxtic dose) Continous Sedation  Consults: Treatment Team:  Altamese [place], MD    Studies:    Events:  Subjective:    Overnight Issues:   Objective:  Vital signs for last 24 hours: Temp:  [98.2 F (36.8 C)-100.8 F (38.2 C)] 99 F (37.2 C) (09/04 1200) Pulse Rate:  [43-111] 97 (09/04 1244) Resp:  [16-21] 16 (09/04 1244) BP: (100-155)/(51-107) 135/107 (09/04 1244) SpO2:  [96 %-100 %] 96 % (09/04 1244) FiO2 (%):  [30 %] 30 % (09/04 1244) Weight:  [104.9 kg (231 lb 4.2 oz)] 104.9 kg (231 lb 4.2 oz) (09/04 0500)  Hemodynamic parameters for last 24 hours:    Intake/Output from previous day: 09/03 0701 - 09/04 0700 In: 5021.3 [I.V.:1991.3; NG/GT:2670; IV Piggyback:360] Out: 2920 [Urine:2570; Emesis/NG  output:350]  Intake/Output this shift: Total I/O In: 103.2 [I.V.:103.2] Out: -   Vent settings for last 24 hours: Vent Mode: CPAP;PSV FiO2 (%):  [30 %] 30 % Set Rate:  [20 bmp] 20 bmp Vt Set:  [580 mL-690 mL] 690 mL PEEP:  [5 cmH20] 5 cmH20 Pressure Support:  [14 cmH20] 14 cmH20 Plateau Pressure:  [17 cmH20-25 cmH20] 24 cmH20  Physical Exam:  General: on vent Neuro: now sedated, RN reports F/C this AM HEENT/Neck: ETT and collar Resp: clear to auscultation bilaterally CVS: regular rate and rhythm, S1, S2 normal, no murmur, click, rub or gallop GI: soft, nontender, BS WNL, no r/g Extremities: edema 3+  Results for orders placed or performed during the hospital encounter of 02/07/17 (from the past 24 hour(s))  Glucose, capillary     Status: Abnormal   Collection Time: 02/20/17  5:01 PM  Result Value Ref Range   Glucose-Capillary 191 (H) 65 - 99 mg/dL  Glucose, capillary     Status: Abnormal   Collection Time: 02/20/17  8:23 PM  Result Value Ref Range   Glucose-Capillary 189 (H) 65 - 99 mg/dL  Glucose, capillary     Status: Abnormal   Collection Time: 02/20/17 11:44 PM  Result Value Ref Range   Glucose-Capillary 221 (H) 65 - 99 mg/dL  Glucose, capillary     Status: Abnormal   Collection Time: 02/21/17  3:58 AM  Result Value Ref Range   Glucose-Capillary 232 (H) 65 - 99 mg/dL  CBC     Status: Abnormal   Collection Time: 02/21/17  5:18 AM  Result Value Ref Range   WBC 11.8 (H)  4.0 - 10.5 K/uL   RBC 2.85 (L) 4.22 - 5.81 MIL/uL   Hemoglobin 8.3 (L) 13.0 - 17.0 g/dL   HCT 25.4 (L) 39.0 - 52.0 %   MCV 89.1 78.0 - 100.0 fL   MCH 29.1 26.0 - 34.0 pg   MCHC 32.7 30.0 - 36.0 g/dL   RDW 15.9 (H) 11.5 - 15.5 %   Platelets 514 (H) 150 - 400 K/uL  Basic metabolic panel     Status: Abnormal   Collection Time: 02/21/17  5:18 AM  Result Value Ref Range   Sodium 146 (H) 135 - 145 mmol/L   Potassium 5.0 3.5 - 5.1 mmol/L   Chloride 119 (H) 101 - 111 mmol/L   CO2 23 22 - 32 mmol/L    Glucose, Bld 213 (H) 65 - 99 mg/dL   BUN 56 (H) 6 - 20 mg/dL   Creatinine, Ser 1.46 (H) 0.61 - 1.24 mg/dL   Calcium 8.2 (L) 8.9 - 10.3 mg/dL   GFR calc non Af Amer 52 (L) >60 mL/min   GFR calc Af Amer >60 >60 mL/min   Anion gap 4 (L) 5 - 15  Glucose, capillary     Status: Abnormal   Collection Time: 02/21/17  9:15 AM  Result Value Ref Range   Glucose-Capillary 216 (H) 65 - 99 mg/dL   Comment 1 Notify RN    Comment 2 Document in Chart   Glucose, capillary     Status: Abnormal   Collection Time: 02/21/17 12:01 PM  Result Value Ref Range   Glucose-Capillary 168 (H) 65 - 99 mg/dL   Comment 1 Notify RN    Comment 2 Document in Chart     Assessment & Plan: Present on Admission: . Multiple fractures of ribs, bilateral, initial encounter for closed fracture . Multiple closed anterior-posterior compression fractures of pelvis (HCC)    LOS: 14 days   Additional comments:I reviewed the patient's new clinical lab test results. 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 Marcelino Scot 8/21 , S/P symphysis plating by Dr. Marcelino Scot 8/27,  - NWB BLE for 8 weeks, no ROM restrictions  Vent dependent acute hypoxic resp failure- PS this morning, PEEP 5, weaning as tolerated, continue Klon/Sero FEN- 0.45NS and free water for hypernatremia, Miralax, dulc supp; 20mg  IV lasix IDDM- lantus 10u QHS, ICU glycemic control protocol ID- ceftriaxone for Klebsiella bactermia, change TLC to PICC once blood Cx cleared Renal - AKI improving VTE- PAS, Lovenox Elevated LFTs- direct hyperbilirubinemia from resorbtion of hematomas - repeat LFTs today Dispo- ICU, I spoke with family at the bedside Critical Care Total Time*: [place] M. Dema Severin, M.D. [facility]  02/21/2017  *Care during the described time interval was provided by me. I have reviewed this patient's available data, including medical history, events of note, physical examination and test results as part of my evaluation.  Patient ID: Peter [name], male   DOB: Mar 27, 1961, 56 y.o.   MRN: [id number]

## 2017-02-21 NOTE — Progress Notes (Signed)
Orthopedic Tech Progress Note Patient Details:  Peter [name] [birth year] [id number]  Patient ID: Peter [name], male   DOB: Mar 24, 1961, 56 y.o.   MRN: [id number]   Peter [name] 02/21/2017, 2:33 PMCalled [employer] for Bilateral resting hand splint.

## 2017-02-22 LAB — CBC
HCT: 24.4 % — ABNORMAL LOW (ref 39.0–52.0)
HEMOGLOBIN: 8 g/dL — AB (ref 13.0–17.0)
MCH: 29.3 pg (ref 26.0–34.0)
MCHC: 32.8 g/dL (ref 30.0–36.0)
MCV: 89.4 fL (ref 78.0–100.0)
Platelets: 543 10*3/uL — ABNORMAL HIGH (ref 150–400)
RBC: 2.73 MIL/uL — AB (ref 4.22–5.81)
RDW: 15.8 % — ABNORMAL HIGH (ref 11.5–15.5)
WBC: 12.4 10*3/uL — AB (ref 4.0–10.5)

## 2017-02-22 LAB — BASIC METABOLIC PANEL
ANION GAP: 4 — AB (ref 5–15)
BUN: 52 mg/dL — AB (ref 6–20)
CHLORIDE: 117 mmol/L — AB (ref 101–111)
CO2: 23 mmol/L (ref 22–32)
Calcium: 8.1 mg/dL — ABNORMAL LOW (ref 8.9–10.3)
Creatinine, Ser: 1.39 mg/dL — ABNORMAL HIGH (ref 0.61–1.24)
GFR calc Af Amer: >60 mL/min (ref >60)
GFR calc non Af Amer: 55 mL/min — ABNORMAL LOW (ref >60)
Glucose, Bld: 205 mg/dL — ABNORMAL HIGH (ref 65–99)
POTASSIUM: 5 mmol/L (ref 3.5–5.1)
SODIUM: 144 mmol/L (ref 135–145)

## 2017-02-22 LAB — POCT I-STAT 3, ART BLOOD GAS (G3+)
ACID-BASE DEFICIT: 1 mmol/L (ref 0.0–2.0)
Bicarbonate: 23.8 mmol/L (ref 20.0–28.0)
O2 Saturation: 97 %
PH ART: 7.421 (ref 7.350–7.450)
PO2 ART: 87 mmHg (ref 83.0–108.0)
Patient temperature: 36.2
TCO2: 25 mmol/L (ref 22–32)
pCO2 arterial: 36.3 mmHg (ref 32.0–48.0)

## 2017-02-22 LAB — GLUCOSE, CAPILLARY
GLUCOSE-CAPILLARY: 199 mg/dL — AB (ref 65–99)
GLUCOSE-CAPILLARY: 212 mg/dL — AB (ref 65–99)
GLUCOSE-CAPILLARY: 214 mg/dL — AB (ref 65–99)
GLUCOSE-CAPILLARY: 253 mg/dL — AB (ref 65–99)
Glucose-Capillary: 184 mg/dL — ABNORMAL HIGH (ref 65–99)
Glucose-Capillary: 214 mg/dL — ABNORMAL HIGH (ref 65–99)
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
## 2017-02-22 NOTE — Progress Notes (Signed)
Follow up - Trauma and Critical Care  Patient Details:    Peter [name] is an 56 y.o. male.  Lines/tubes : Airway 8 mm (Active)  Secured at (cm) 24 cm 02/22/2017 11:32 AM  Measured From Lips 02/22/2017 11:32 AM  [place] 02/22/2017 11:32 AM  Secured By [employer] 02/22/2017 11:32 AM  Tube Holder Repositioned Yes 02/22/2017 11:32 AM  Cuff Pressure (cm H2O) 22 cm H2O 02/22/2017  8:05 AM  Site Condition Dry 02/22/2017 11:32 AM     NG/OG Tube Orogastric 16 Fr. Right mouth Xray (Active)  Cm Marking at Nare/Corner of Mouth (if applicable) 61 cm 12/19/5364  8:00 AM  Site Assessment Clean;Dry;Intact 02/22/2017  8:00 AM  Ongoing Placement Verification No change in cm markings or external length of tube from initial placement;No change in respiratory status;No acute changes, not attributed to clinical condition 02/22/2017  8:00 AM  Status Infusing tube feed 02/22/2017  8:00 AM  Drainage Appearance Green 02/18/2017  5:00 AM  Intake (mL) 30 mL 02/22/2017  4:00 AM  Output (mL) 350 mL 02/20/2017 10:00 AM     Urethral Catheter [name], EMT Double-lumen 16 Fr. (Active)  Indication for Insertion or Continuance of Catheter Bladder outlet obstruction / other urologic reason 02/22/2017  8:00 AM  Site Assessment Edema;Swelling 02/22/2017  8:00 AM  Catheter Maintenance Bag below level of bladder;Catheter secured;Drainage bag/tubing not touching floor;Insertion date on drainage bag;No dependent loops;Seal intact 02/22/2017  8:00 AM  Collection Container Standard drainage bag 02/22/2017  8:00 AM  Securement Method Leg strap 02/22/2017  8:00 AM  Urinary Catheter Interventions Unclamped 02/22/2017  8:00 AM  Input (mL) 600 mL 02/21/2017  6:00 PM  Output (mL) 450 mL 02/22/2017 12:00 PM    Microbiology/Sepsis markers: Results for orders placed or performed during the hospital encounter of 02/07/17  MRSA PCR Screening     Status: None   Collection Time: 02/08/17  4:08 AM  Result Value Ref Range Status   MRSA by PCR  NEGATIVE NEGATIVE Final    Comment:        The GeneXpert MRSA Assay (FDA approved for NASAL specimens only), is one component of a comprehensive MRSA colonization surveillance program. It is not intended to diagnose MRSA infection nor to guide or monitor treatment for MRSA infections.   Culture, respiratory (NON-Expectorated)     Status: None   Collection Time: 02/10/17 10:59 AM  Result Value Ref Range Status   Specimen Description TRACHEAL ASPIRATE  Final   Special Requests Normal  Final   Gram Stain   Final    ABUNDANT WBC PRESENT, PREDOMINANTLY PMN RARE SQUAMOUS EPITHELIAL CELLS PRESENT ABUNDANT GRAM NEGATIVE COCCI IN PAIRS ABUNDANT GRAM NEGATIVE RODS MODERATE GRAM POSITIVE COCCI IN CHAINS    Culture ABUNDANT Consistent with normal respiratory flora.  Final   Report Status 02/12/2017 FINAL  Final  Culture, blood (Routine X 2) w Reflex to ID Panel     Status: Abnormal   Collection Time: 02/11/17  3:25 PM  Result Value Ref Range Status   Specimen Description BLOOD RIGHT HAND  Final   Special Requests   Final    BOTTLES DRAWN AEROBIC ONLY Blood Culture adequate volume   Culture  Setup Time   Final    GRAM NEGATIVE RODS AEROBIC BOTTLE ONLY CRITICAL VALUE NOTED.  VALUE IS CONSISTENT WITH PREVIOUSLY REPORTED AND CALLED VALUE.    Culture (A)  Final    KLEBSIELLA OXYTOCA SUSCEPTIBILITIES PERFORMED ON PREVIOUS CULTURE WITHIN THE LAST 5  DAYS.    Report Status 02/17/2017 FINAL  Final  Culture, blood (Routine X 2) w Reflex to ID Panel     Status: Abnormal   Collection Time: 02/11/17  3:25 PM  Result Value Ref Range Status   Specimen Description BLOOD RIGHT HAND  Final   Special Requests   Final    BOTTLES DRAWN AEROBIC ONLY Blood Culture adequate volume   Culture  Setup Time   Final    GRAM NEGATIVE RODS AEROBIC BOTTLE ONLY CRITICAL RESULT CALLED TO, READ BACK BY AND VERIFIED WITH: J.LEDFORD, PHARMD 02/15/17 0632 L.CHAMPION    Culture KLEBSIELLA OXYTOCA (A)  Final   Report  Status 02/17/2017 FINAL  Final   Organism ID, Bacteria KLEBSIELLA OXYTOCA  Final      Susceptibility   Klebsiella oxytoca - MIC*    AMPICILLIN >=32 RESISTANT Resistant     CEFAZOLIN >=64 RESISTANT Resistant     CEFEPIME <=1 SENSITIVE Sensitive     CEFTAZIDIME <=1 SENSITIVE Sensitive     CEFTRIAXONE <=1 SENSITIVE Sensitive     CIPROFLOXACIN <=0.25 SENSITIVE Sensitive     GENTAMICIN <=1 SENSITIVE Sensitive     IMIPENEM <=0.25 SENSITIVE Sensitive     TRIMETH/SULFA <=20 SENSITIVE Sensitive     AMPICILLIN/SULBACTAM 8 SENSITIVE Sensitive     PIP/TAZO <=4 SENSITIVE Sensitive     Extended ESBL NEGATIVE Sensitive     * KLEBSIELLA OXYTOCA  Blood Culture ID Panel (Reflexed)     Status: Abnormal   Collection Time: 02/11/17  3:25 PM  Result Value Ref Range Status   Enterococcus species NOT DETECTED NOT DETECTED Final   Listeria monocytogenes NOT DETECTED NOT DETECTED Final   Staphylococcus species NOT DETECTED NOT DETECTED Final   Staphylococcus aureus NOT DETECTED NOT DETECTED Final   Streptococcus species NOT DETECTED NOT DETECTED Final   Streptococcus agalactiae NOT DETECTED NOT DETECTED Final   Streptococcus pneumoniae NOT DETECTED NOT DETECTED Final   Streptococcus pyogenes NOT DETECTED NOT DETECTED Final   Acinetobacter baumannii NOT DETECTED NOT DETECTED Final   Enterobacteriaceae species DETECTED (A) NOT DETECTED Final    Comment: Enterobacteriaceae represent a large family of gram-negative bacteria, not a single organism. CRITICAL RESULT CALLED TO, READ BACK BY AND VERIFIED WITH: J.LEDFORD, PHARMD 02/15/17 0632 L.CHAMPION    Enterobacter cloacae complex NOT DETECTED NOT DETECTED Final   Escherichia coli NOT DETECTED NOT DETECTED Final   Klebsiella oxytoca DETECTED (A) NOT DETECTED Final    Comment: CRITICAL RESULT CALLED TO, READ BACK BY AND VERIFIED WITH: J.LEDFORD, PHARMD 02/15/17 0632 L.CHAMPION    Klebsiella pneumoniae NOT DETECTED NOT DETECTED Final   Proteus species NOT  DETECTED NOT DETECTED Final   Serratia marcescens NOT DETECTED NOT DETECTED Final   Carbapenem resistance NOT DETECTED NOT DETECTED Final   Haemophilus influenzae NOT DETECTED NOT DETECTED Final   Neisseria meningitidis NOT DETECTED NOT DETECTED Final   Pseudomonas aeruginosa NOT DETECTED NOT DETECTED Final   Candida albicans NOT DETECTED NOT DETECTED Final   Candida glabrata NOT DETECTED NOT DETECTED Final   Candida krusei NOT DETECTED NOT DETECTED Final   Candida parapsilosis NOT DETECTED NOT DETECTED Final   Candida tropicalis NOT DETECTED NOT DETECTED Final  Surgical PCR screen     Status: None   Collection Time: 02/13/17  6:30 AM  Result Value Ref Range Status   MRSA, PCR NEGATIVE NEGATIVE Final   Staphylococcus aureus NEGATIVE NEGATIVE Final    Comment:        The Xpert SA Assay (  FDA approved for NASAL specimens in patients over 77 years of age), is one component of a comprehensive surveillance program.  Test performance has been validated by [facility] for patients greater than or equal to 37 year old. It is not intended to diagnose infection nor to guide or monitor treatment.   Culture, blood (Routine X 2) w Reflex to ID Panel     Status: None (Preliminary result)   Collection Time: 02/18/17  2:52 PM  Result Value Ref Range Status   Specimen Description BLOOD RIGHT HAND  Final   Special Requests IN PEDIATRIC BOTTLE Blood Culture adequate volume  Final   Culture NO GROWTH 4 DAYS  Final   Report Status PENDING  Incomplete  Culture, blood (Routine X 2) w Reflex to ID Panel     Status: Abnormal   Collection Time: 02/18/17  2:55 PM  Result Value Ref Range Status   Specimen Description BLOOD RIGHT WRIST  Final   Special Requests IN PEDIATRIC BOTTLE Blood Culture adequate volume  Final   Culture  Setup Time   Final    IN PEDIATRIC BOTTLE GRAM POSITIVE RODS CRITICAL RESULT CALLED TO, READ BACK BY AND VERIFIED WITH: L FOLTANSKI,PHARMD AT 0730 02/19/17 BY L BENFIELD     Culture (A)  Final    BACILLUS SPECIES Standardized susceptibility testing for this organism is not available. ENTEROCOCCUS DETECTED ON BCID, NOT RECOVERED IN CULTURE    Report Status 02/21/2017 FINAL  Final  Blood Culture ID Panel (Reflexed)     Status: Abnormal   Collection Time: 02/18/17  2:55 PM  Result Value Ref Range Status   Enterococcus species DETECTED (A) NOT DETECTED Final    Comment: CRITICAL RESULT CALLED TO, READ BACK BY AND VERIFIED WITH: L FOLTANSKI,PHARMD AT 0730 02/19/17 BY L BENFIELD    Vancomycin resistance NOT DETECTED NOT DETECTED Final   Listeria monocytogenes NOT DETECTED NOT DETECTED Final   Staphylococcus species NOT DETECTED NOT DETECTED Final   Staphylococcus aureus NOT DETECTED NOT DETECTED Final   Streptococcus species NOT DETECTED NOT DETECTED Final   Streptococcus agalactiae NOT DETECTED NOT DETECTED Final   Streptococcus pneumoniae NOT DETECTED NOT DETECTED Final   Streptococcus pyogenes NOT DETECTED NOT DETECTED Final   Acinetobacter baumannii NOT DETECTED NOT DETECTED Final   Enterobacteriaceae species NOT DETECTED NOT DETECTED Final   Enterobacter cloacae complex NOT DETECTED NOT DETECTED Final   Escherichia coli NOT DETECTED NOT DETECTED Final   Klebsiella oxytoca NOT DETECTED NOT DETECTED Final   Klebsiella pneumoniae NOT DETECTED NOT DETECTED Final   Proteus species NOT DETECTED NOT DETECTED Final   Serratia marcescens NOT DETECTED NOT DETECTED Final   Haemophilus influenzae NOT DETECTED NOT DETECTED Final   Neisseria meningitidis NOT DETECTED NOT DETECTED Final   Pseudomonas aeruginosa NOT DETECTED NOT DETECTED Final   Candida albicans NOT DETECTED NOT DETECTED Final   Candida glabrata NOT DETECTED NOT DETECTED Final   Candida krusei NOT DETECTED NOT DETECTED Final   Candida parapsilosis NOT DETECTED NOT DETECTED Final   Candida tropicalis NOT DETECTED NOT DETECTED Final  Culture, blood (routine x 2)     Status: None (Preliminary result)    Collection Time: 02/20/17  4:35 AM  Result Value Ref Range Status   Specimen Description BLOOD RIGHT HAND  Final   Special Requests   Final    BOTTLES DRAWN AEROBIC AND ANAEROBIC Blood Culture adequate volume   Culture NO GROWTH 2 DAYS  Final   Report Status PENDING  Incomplete  Culture, blood (routine x 2)     Status: None (Preliminary result)   Collection Time: 02/20/17  4:35 AM  Result Value Ref Range Status   Specimen Description BLOOD RIGHT HAND  Final   Special Requests   Final    BOTTLES DRAWN AEROBIC AND ANAEROBIC Blood Culture adequate volume   Culture NO GROWTH 2 DAYS  Final   Report Status PENDING  Incomplete    Anti-infectives:  Anti-infectives    Start     Dose/Rate Route Frequency Ordered Stop   02/19/17 2200  vancomycin (VANCOCIN) IVPB 750 mg/150 ml premix  Status:  Discontinued     750 mg 150 mL/hr over 60 Minutes Intravenous Every 12 hours 02/19/17 0807 02/20/17 1150   02/19/17 0900  vancomycin (VANCOCIN) 2,000 mg in sodium chloride 0.9 % 500 mL IVPB     2,000 mg 250 mL/hr over 120 Minutes Intravenous  Once 02/19/17 0807 02/19/17 1151   02/17/17 1100  cefTRIAXone (ROCEPHIN) 2 g in dextrose 5 % 50 mL IVPB  Status:  Discontinued     2 g 100 mL/hr over 30 Minutes Intravenous Every 24 hours 02/17/17 1041 02/19/17 0801   02/14/17 1500  piperacillin-tazobactam (ZOSYN) IVPB 3.375 g  Status:  Discontinued     3.375 g 12.5 mL/hr over 240 Minutes Intravenous Every 8 hours 02/14/17 1351 02/17/17 1041   02/11/17 1000  piperacillin-tazobactam (ZOSYN) IVPB 3.375 g  Status:  Discontinued     3.375 g 12.5 mL/hr over 240 Minutes Intravenous Every 8 hours 02/11/17 0858 02/14/17 1351   02/11/17 1000  vancomycin (VANCOCIN) IVPB 750 mg/150 ml premix  Status:  Discontinued     750 mg 150 mL/hr over 60 Minutes Intravenous Every 12 hours 02/11/17 0858 02/14/17 0844   02/07/17 2000  ceFAZolin (ANCEF) IVPB 1 g/50 mL premix     1 g 100 mL/hr over 30 Minutes Intravenous Every 8 hours  02/07/17 1416 02/08/17 1543      Best Practice/Protocols:  VTE Prophylaxis: Lovenox (prophylaxtic dose) and Mechanical GI Prophylaxis: Proton Pump Inhibitor Continous Sedation  Consults: Treatment Team:  [place], MD    Events:  Subjective:    Overnight Issues: No big issues overnight.  Objective:  Vital signs for last 24 hours: Temp:  [97.3 F (36.3 C)-98.8 F (37.1 C)] 97.3 F (36.3 C) (09/05 1132) Pulse Rate:  [39-105] 102 (09/05 1132) Resp:  [20-27] 27 (09/05 1132) BP: (102-159)/(55-92) 102/83 (09/05 1132) SpO2:  [95 %-99 %] 95 % (09/05 1132) FiO2 (%):  [30 %] 30 % (09/05 1132) Weight:  [105 kg (231 lb 7.7 oz)] 105 kg (231 lb 7.7 oz) (09/05 0500)  Hemodynamic parameters for last 24 hours:    Intake/Output from previous day: 09/04 0701 - 09/05 0700 In: 6705.3 [I.V.:1770.3; NG/GT:1830; IV Piggyback:105] Out: 2400 [Urine:2400]  Intake/Output this shift: Total I/O In: 20 [I.V.:20] Out: 725 [Urine:725]  Vent settings for last 24 hours: Vent Mode: PRVC FiO2 (%):  [30 %] 30 % Set Rate:  [20 bmp] 20 bmp Vt Set:  [580 mL] 580 mL PEEP:  [5 cmH20] 5 cmH20 Pressure Support:  [10 cmH20] 10 cmH20 Plateau Pressure:  [23 cmH20-25 cmH20] 25 cmH20  Physical Exam:  General: no respiratory distress and would not respond to me this AM Neuro: nonfocal exam and RASS -2 Resp: clear to auscultation bilaterally and CXR from yesterday is clear CVS: regular rate and rhythm, S1, S2 normal, no murmur, click, rub or gallop GI: soft, nontender, BS WNL, no r/g and  tolerating tube feedings.  Patient is jaundiced with elevated ammonia level. Extremities: edema 3+, edema 4+ and edematous all over  Results for orders placed or performed during the hospital encounter of 02/07/17 (from the past 24 hour(s))  Glucose, capillary     Status: Abnormal   Collection Time: 02/21/17  4:17 PM  Result Value Ref Range   Glucose-Capillary 124 (H) 65 - 99 mg/dL   Comment 1 Notify RN     Comment 2 Document in Chart   Glucose, capillary     Status: Abnormal   Collection Time: 02/21/17  8:06 PM  Result Value Ref Range   Glucose-Capillary 162 (H) 65 - 99 mg/dL  Glucose, capillary     Status: Abnormal   Collection Time: 02/21/17 11:51 PM  Result Value Ref Range   Glucose-Capillary 190 (H) 65 - 99 mg/dL  Glucose, capillary     Status: Abnormal   Collection Time: 02/22/17  4:18 AM  Result Value Ref Range   Glucose-Capillary 184 (H) 65 - 99 mg/dL  CBC     Status: Abnormal   Collection Time: 02/22/17  5:12 AM  Result Value Ref Range   WBC 12.4 (H) 4.0 - 10.5 K/uL   RBC 2.73 (L) 4.22 - 5.81 MIL/uL   Hemoglobin 8.0 (L) 13.0 - 17.0 g/dL   HCT 24.4 (L) 39.0 - 52.0 %   MCV 89.4 78.0 - 100.0 fL   MCH 29.3 26.0 - 34.0 pg   MCHC 32.8 30.0 - 36.0 g/dL   RDW 15.8 (H) 11.5 - 15.5 %   Platelets 543 (H) 150 - 400 K/uL  Basic metabolic panel     Status: Abnormal   Collection Time: 02/22/17  5:12 AM  Result Value Ref Range   Sodium 144 135 - 145 mmol/L   Potassium 5.0 3.5 - 5.1 mmol/L   Chloride 117 (H) 101 - 111 mmol/L   CO2 23 22 - 32 mmol/L   Glucose, Bld 205 (H) 65 - 99 mg/dL   BUN 52 (H) 6 - 20 mg/dL   Creatinine, Ser 1.39 (H) 0.61 - 1.24 mg/dL   Calcium 8.1 (L) 8.9 - 10.3 mg/dL   GFR calc non Af Amer 55 (L) >60 mL/min   GFR calc Af Amer >60 >60 mL/min   Anion gap 4 (L) 5 - 15  Glucose, capillary     Status: Abnormal   Collection Time: 02/22/17 11:59 AM  Result Value Ref Range   Glucose-Capillary 214 (H) 65 - 99 mg/dL     Assessment/Plan:   NEURO  Altered Mental Status:  obtundation and sedation   Plan: Not awake enough to consider weaning.  PULM  NO specific problems, but just not able to wean for extubation.   Plan: CPM.  May need trach.  Did not wean long today.  CARDIO  No specific issues   Plan: CPM  RENAL  Actue Renal Failure (etiology unknown) and Chronic Renal Insufficiency Hypervolemia and positive close to 40 liters since admission   Plan: CPM.   Current urine output is good.BUN>50  GI  Hyperbilirubinemia   Plan: Thhis alone should not promote liver failure  ID  Klebsiella bacteremia   Plan: CUrrently no on antibiotics  HEME  Anemia acute blood loss anemia and anemia of critical illness)   Plan: No blood for now.  ENDO No specific issues   Plan: CPM  Global Issues  Patient has been intubated since admission and it does not seems likely that he will be able  to be safely extubated.  He also has a left subclavian central line that has been in place for > 10 days.  Needs PICC and possible trach/PEG    LOS: 15 days   Additional comments:I reviewed the patient's new clinical lab test results. cbc/bmet and I reviewed the patients new imaging test results. cxt  Critical Care Total Time*: 30 Minutes  Maclain Cohron 02/22/2017  *Care during the described time interval was provided by me and/or other providers on the critical care team.  I have reviewed this patient's available data, including medical history, events of note, physical examination and test results as part of my evaluation.

## 2017-02-23 LAB — COMPREHENSIVE METABOLIC PANEL
ALK PHOS: 485 U/L — AB (ref 38–126)
ALT: 44 U/L (ref 17–63)
ANION GAP: 4 — AB (ref 5–15)
AST: 27 U/L (ref 15–41)
Albumin: 1.5 g/dL — ABNORMAL LOW (ref 3.5–5.0)
BILIRUBIN TOTAL: 7.3 mg/dL — AB (ref 0.3–1.2)
BUN: 52 mg/dL — ABNORMAL HIGH (ref 6–20)
CALCIUM: 8.2 mg/dL — AB (ref 8.9–10.3)
CO2: 24 mmol/L (ref 22–32)
Chloride: 116 mmol/L — ABNORMAL HIGH (ref 101–111)
Creatinine, Ser: 1.3 mg/dL — ABNORMAL HIGH (ref 0.61–1.24)
GFR, EST NON AFRICAN AMERICAN: 60 mL/min — AB (ref >60)
Glucose, Bld: 265 mg/dL — ABNORMAL HIGH (ref 65–99)
Potassium: 5.1 mmol/L (ref 3.5–5.1)
SODIUM: 144 mmol/L (ref 135–145)
TOTAL PROTEIN: 5.4 g/dL — AB (ref 6.5–8.1)

## 2017-02-23 LAB — CBC WITH DIFFERENTIAL/PLATELET
Basophils Absolute: 0 10*3/uL (ref 0.0–0.1)
Basophils Relative: 0 %
EOS ABS: 0.3 10*3/uL (ref 0.0–0.7)
EOS PCT: 2 %
HCT: 24.2 % — ABNORMAL LOW (ref 39.0–52.0)
HEMOGLOBIN: 7.8 g/dL — AB (ref 13.0–17.0)
LYMPHS ABS: 0.8 10*3/uL (ref 0.7–4.0)
Lymphocytes Relative: 7 %
MCH: 28.7 pg (ref 26.0–34.0)
MCHC: 32.2 g/dL (ref 30.0–36.0)
MCV: 89 fL (ref 78.0–100.0)
MONOS PCT: 12 %
Monocytes Absolute: 1.3 10*3/uL — ABNORMAL HIGH (ref 0.1–1.0)
NEUTROS PCT: 79 %
Neutro Abs: 8.8 10*3/uL — ABNORMAL HIGH (ref 1.7–7.7)
Platelets: 540 10*3/uL — ABNORMAL HIGH (ref 150–400)
RBC: 2.72 MIL/uL — ABNORMAL LOW (ref 4.22–5.81)
RDW: 15.6 % — ABNORMAL HIGH (ref 11.5–15.5)
WBC: 11.2 10*3/uL — ABNORMAL HIGH (ref 4.0–10.5)

## 2017-02-23 LAB — GLUCOSE, CAPILLARY
GLUCOSE-CAPILLARY: 210 mg/dL — AB (ref 65–99)
GLUCOSE-CAPILLARY: 243 mg/dL — AB (ref 65–99)
GLUCOSE-CAPILLARY: 247 mg/dL — AB (ref 65–99)
Glucose-Capillary: 253 mg/dL — ABNORMAL HIGH (ref 65–99)
Glucose-Capillary: 207 mg/dL — ABNORMAL HIGH (ref 65–99)
Glucose-Capillary: 196 mg/dL — ABNORMAL HIGH (ref 65–99)

## 2017-02-23 LAB — CULTURE, BLOOD (ROUTINE X 2)
CULTURE: NO GROWTH
Special Requests: ADEQUATE

## 2017-02-23 LAB — AMMONIA: AMMONIA: 29 umol/L (ref 9–35)

## 2017-02-23 MED ORDER — FUROSEMIDE 10 MG/ML IJ SOLN
40.0000 mg | Freq: Once | INTRAMUSCULAR | Status: AC
  Administered 2017-02-23: 40 mg via INTRAVENOUS
  Filled 2017-02-23: qty 4

## 2017-02-23 MED ORDER — SODIUM CHLORIDE 0.45 % IV SOLN
INTRAVENOUS | Status: DC
  Administered 2017-02-23 – 2017-03-05 (×7): via INTRAVENOUS
  Administered 2017-03-11 – 2017-03-13 (×3): 1000 mL via INTRAVENOUS
  Administered 2017-03-16 – 2017-03-20 (×2): via INTRAVENOUS

## 2017-02-23 MED ORDER — GLUCERNA 1.2 CAL PO LIQD
1000.0000 mL | ORAL | Status: DC
  Administered 2017-02-23 – 2017-03-23 (×36): 1000 mL
  Filled 2017-02-23 (×61): qty 1000

## 2017-02-23 MED ORDER — SODIUM CHLORIDE 0.9% FLUSH: 10.0000 mL | INTRAVENOUS | Status: DC | PRN

## 2017-02-23 MED ORDER — PRO-STAT SUGAR FREE PO LIQD
30.0000 mL | Freq: Two times a day (BID) | ORAL | Status: DC
  Administered 2017-02-23 – 2017-03-23 (×56): 30 mL
  Filled 2017-02-23 (×56): qty 30

## 2017-02-23 MED ORDER — SODIUM CHLORIDE 0.9% FLUSH
10.0000 mL | Freq: Two times a day (BID) | INTRAVENOUS | Status: DC
Start: 2017-02-23 — End: 2017-03-23
  Administered 2017-02-23 – 2017-03-02 (×10): 10 mL
  Administered 2017-03-03: 20 mL
  Administered 2017-03-03: 10 mL
  Administered 2017-03-04: 20 mL
  Administered 2017-03-04 – 2017-03-13 (×16): 10 mL
  Administered 2017-03-13 – 2017-03-15 (×4): 20 mL
  Administered 2017-03-15 – 2017-03-17 (×4): 10 mL
  Administered 2017-03-17: 20 mL
  Administered 2017-03-18: 10 mL
  Administered 2017-03-18: 20 mL
  Administered 2017-03-19 – 2017-03-21 (×5): 10 mL
  Administered 2017-03-22: 20 mL
  Administered 2017-03-22 – 2017-03-23 (×2): 10 mL

## 2017-02-23 MED ORDER — SODIUM CHLORIDE 0.45 % IV SOLN
INTRAVENOUS | Status: DC
  Filled 2017-02-23: qty 1000

## 2017-02-23 MED ORDER — CHLORHEXIDINE GLUCONATE CLOTH 2 % EX PADS
6.0000 | MEDICATED_PAD | Freq: Every day | CUTANEOUS | Status: DC
  Administered 2017-02-27 – 2017-03-23 (×24): 6 via TOPICAL
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
## 2017-02-23 NOTE — Plan of Care (Signed)
Problem: Skin Integrity: Goal: Risk for impaired skin integrity will decrease Outcome: Not Progressing Blisters on right arm, scrotum, chest, thigh, abdomen, heel.

## 2017-02-23 NOTE — Progress Notes (Signed)
Follow up - Trauma Critical Care  Patient Details:    Peter [name] is an 56 y.o. male.  Lines/tubes : Airway 8 mm (Active)  Secured at (cm) 24 cm 02/23/2017  8:08 AM  Measured From Lips 02/23/2017  8:08 AM  Secured Location Right 02/23/2017  8:08 AM  Secured By [employer] 02/23/2017  8:08 AM  Tube Holder Repositioned Yes 02/23/2017  8:08 AM  Cuff Pressure (cm H2O) 26 cm H2O 02/23/2017  8:08 AM  Site Condition Dry 02/23/2017  8:08 AM     NG/OG Tube Orogastric 16 Fr. Right mouth Xray (Active)  Cm Marking at Nare/Corner of Mouth (if applicable) 61 cm 01/22/2777  5:00 AM  Site Assessment Clean;Dry;Intact 02/22/2017  8:00 PM  Ongoing Placement Verification No change in cm markings or external length of tube from initial placement;No change in respiratory status;No acute changes, not attributed to clinical condition 02/22/2017  8:00 PM  Status Infusing tube feed 02/22/2017  8:00 PM  Drainage Appearance Green 02/18/2017  5:00 AM  Intake (mL) 30 mL 02/22/2017  4:00 AM  Output (mL) 350 mL 02/20/2017 10:00 AM     Urethral Catheter [name], EMT Double-lumen 16 Fr. (Active)  Indication for Insertion or Continuance of Catheter Bladder outlet obstruction / other urologic reason 02/22/2017  8:00 PM  Site Assessment Edema;Swelling 02/22/2017  8:00 PM  Catheter Maintenance Bag below level of bladder;Catheter secured;Drainage bag/tubing not touching floor;Insertion date on drainage bag;No dependent loops;Seal intact 02/22/2017  8:00 PM  Collection Container Standard drainage bag 02/22/2017  8:00 PM  Securement Method Leg strap 02/22/2017  8:00 PM  Urinary Catheter Interventions Unclamped 02/22/2017  8:00 PM  Input (mL) 600 mL 02/21/2017  6:00 PM  Output (mL) 100 mL 02/23/2017  5:42 AM    Microbiology/Sepsis markers: Results for orders placed or performed during the hospital encounter of 02/07/17  MRSA PCR Screening     Status: None   Collection Time: 02/08/17  4:08 AM  Result Value Ref Range Status   MRSA by PCR NEGATIVE  NEGATIVE Final    Comment:        The GeneXpert MRSA Assay (FDA approved for NASAL specimens only), is one component of a comprehensive MRSA colonization surveillance program. It is not intended to diagnose MRSA infection nor to guide or monitor treatment for MRSA infections.   Culture, respiratory (NON-Expectorated)     Status: None   Collection Time: 02/10/17 10:59 AM  Result Value Ref Range Status   Specimen Description TRACHEAL ASPIRATE  Final   Special Requests Normal  Final   Gram Stain   Final    ABUNDANT WBC PRESENT, PREDOMINANTLY PMN RARE SQUAMOUS EPITHELIAL CELLS PRESENT ABUNDANT GRAM NEGATIVE COCCI IN PAIRS ABUNDANT GRAM NEGATIVE RODS MODERATE GRAM POSITIVE COCCI IN CHAINS    Culture ABUNDANT Consistent with normal respiratory flora.  Final   Report Status 02/12/2017 FINAL  Final  Culture, blood (Routine X 2) w Reflex to ID Panel     Status: Abnormal   Collection Time: 02/11/17  3:25 PM  Result Value Ref Range Status   Specimen Description BLOOD RIGHT HAND  Final   Special Requests   Final    BOTTLES DRAWN AEROBIC ONLY Blood Culture adequate volume   Culture  Setup Time   Final    GRAM NEGATIVE RODS AEROBIC BOTTLE ONLY CRITICAL VALUE NOTED.  VALUE IS CONSISTENT WITH PREVIOUSLY REPORTED AND CALLED VALUE.    Culture (A)  Final    KLEBSIELLA OXYTOCA SUSCEPTIBILITIES PERFORMED ON  PREVIOUS CULTURE WITHIN THE LAST 5 DAYS.    Report Status 02/17/2017 FINAL  Final  Culture, blood (Routine X 2) w Reflex to ID Panel     Status: Abnormal   Collection Time: 02/11/17  3:25 PM  Result Value Ref Range Status   Specimen Description BLOOD RIGHT HAND  Final   Special Requests   Final    BOTTLES DRAWN AEROBIC ONLY Blood Culture adequate volume   Culture  Setup Time   Final    GRAM NEGATIVE RODS AEROBIC BOTTLE ONLY CRITICAL RESULT CALLED TO, READ BACK BY AND VERIFIED WITH: J.LEDFORD, PHARMD 02/15/17 0632 L.CHAMPION    Culture KLEBSIELLA OXYTOCA (A)  Final   Report Status  02/17/2017 FINAL  Final   Organism ID, Bacteria KLEBSIELLA OXYTOCA  Final      Susceptibility   Klebsiella oxytoca - MIC*    AMPICILLIN >=32 RESISTANT Resistant     CEFAZOLIN >=64 RESISTANT Resistant     CEFEPIME <=1 SENSITIVE Sensitive     CEFTAZIDIME <=1 SENSITIVE Sensitive     CEFTRIAXONE <=1 SENSITIVE Sensitive     CIPROFLOXACIN <=0.25 SENSITIVE Sensitive     GENTAMICIN <=1 SENSITIVE Sensitive     IMIPENEM <=0.25 SENSITIVE Sensitive     TRIMETH/SULFA <=20 SENSITIVE Sensitive     AMPICILLIN/SULBACTAM 8 SENSITIVE Sensitive     PIP/TAZO <=4 SENSITIVE Sensitive     Extended ESBL NEGATIVE Sensitive     * KLEBSIELLA OXYTOCA  Blood Culture ID Panel (Reflexed)     Status: Abnormal   Collection Time: 02/11/17  3:25 PM  Result Value Ref Range Status   Enterococcus species NOT DETECTED NOT DETECTED Final   Listeria monocytogenes NOT DETECTED NOT DETECTED Final   Staphylococcus species NOT DETECTED NOT DETECTED Final   Staphylococcus aureus NOT DETECTED NOT DETECTED Final   Streptococcus species NOT DETECTED NOT DETECTED Final   Streptococcus agalactiae NOT DETECTED NOT DETECTED Final   Streptococcus pneumoniae NOT DETECTED NOT DETECTED Final   Streptococcus pyogenes NOT DETECTED NOT DETECTED Final   Acinetobacter baumannii NOT DETECTED NOT DETECTED Final   Enterobacteriaceae species DETECTED (A) NOT DETECTED Final    Comment: Enterobacteriaceae represent a large family of gram-negative bacteria, not a single organism. CRITICAL RESULT CALLED TO, READ BACK BY AND VERIFIED WITH: J.LEDFORD, PHARMD 02/15/17 0632 L.CHAMPION    Enterobacter cloacae complex NOT DETECTED NOT DETECTED Final   Escherichia coli NOT DETECTED NOT DETECTED Final   Klebsiella oxytoca DETECTED (A) NOT DETECTED Final    Comment: CRITICAL RESULT CALLED TO, READ BACK BY AND VERIFIED WITH: J.LEDFORD, PHARMD 02/15/17 0632 L.CHAMPION    Klebsiella pneumoniae NOT DETECTED NOT DETECTED Final   Proteus species NOT DETECTED NOT  DETECTED Final   Serratia marcescens NOT DETECTED NOT DETECTED Final   Carbapenem resistance NOT DETECTED NOT DETECTED Final   Haemophilus influenzae NOT DETECTED NOT DETECTED Final   Neisseria meningitidis NOT DETECTED NOT DETECTED Final   Pseudomonas aeruginosa NOT DETECTED NOT DETECTED Final   Candida albicans NOT DETECTED NOT DETECTED Final   Candida glabrata NOT DETECTED NOT DETECTED Final   Candida krusei NOT DETECTED NOT DETECTED Final   Candida parapsilosis NOT DETECTED NOT DETECTED Final   Candida tropicalis NOT DETECTED NOT DETECTED Final  Surgical PCR screen     Status: None   Collection Time: 02/13/17  6:30 AM  Result Value Ref Range Status   MRSA, PCR NEGATIVE NEGATIVE Final   Staphylococcus aureus NEGATIVE NEGATIVE Final    Comment:  The Xpert SA Assay (FDA approved for NASAL specimens in patients over 46 years of age), is one component of a comprehensive surveillance program.  Test performance has been validated by [facility] for patients greater than or equal to 53 year old. It is not intended to diagnose infection nor to guide or monitor treatment.   Culture, blood (Routine X 2) w Reflex to ID Panel     Status: None   Collection Time: 02/18/17  2:52 PM  Result Value Ref Range Status   Specimen Description BLOOD RIGHT HAND  Final   Special Requests IN PEDIATRIC BOTTLE Blood Culture adequate volume  Final   Culture NO GROWTH 5 DAYS  Final   Report Status 02/23/2017 FINAL  Final  Culture, blood (Routine X 2) w Reflex to ID Panel     Status: Abnormal   Collection Time: 02/18/17  2:55 PM  Result Value Ref Range Status   Specimen Description BLOOD RIGHT WRIST  Final   Special Requests IN PEDIATRIC BOTTLE Blood Culture adequate volume  Final   Culture  Setup Time   Final    IN PEDIATRIC BOTTLE GRAM POSITIVE RODS CRITICAL RESULT CALLED TO, READ BACK BY AND VERIFIED WITH: L FOLTANSKI,PHARMD AT 0730 02/19/17 BY L BENFIELD    Culture (A)  Final    BACILLUS  SPECIES Standardized susceptibility testing for this organism is not available. ENTEROCOCCUS DETECTED ON BCID, NOT RECOVERED IN CULTURE    Report Status 02/21/2017 FINAL  Final  Blood Culture ID Panel (Reflexed)     Status: Abnormal   Collection Time: 02/18/17  2:55 PM  Result Value Ref Range Status   Enterococcus species DETECTED (A) NOT DETECTED Final    Comment: CRITICAL RESULT CALLED TO, READ BACK BY AND VERIFIED WITH: L FOLTANSKI,PHARMD AT 0730 02/19/17 BY L BENFIELD    Vancomycin resistance NOT DETECTED NOT DETECTED Final   Listeria monocytogenes NOT DETECTED NOT DETECTED Final   Staphylococcus species NOT DETECTED NOT DETECTED Final   Staphylococcus aureus NOT DETECTED NOT DETECTED Final   Streptococcus species NOT DETECTED NOT DETECTED Final   Streptococcus agalactiae NOT DETECTED NOT DETECTED Final   Streptococcus pneumoniae NOT DETECTED NOT DETECTED Final   Streptococcus pyogenes NOT DETECTED NOT DETECTED Final   Acinetobacter baumannii NOT DETECTED NOT DETECTED Final   Enterobacteriaceae species NOT DETECTED NOT DETECTED Final   Enterobacter cloacae complex NOT DETECTED NOT DETECTED Final   Escherichia coli NOT DETECTED NOT DETECTED Final   Klebsiella oxytoca NOT DETECTED NOT DETECTED Final   Klebsiella pneumoniae NOT DETECTED NOT DETECTED Final   Proteus species NOT DETECTED NOT DETECTED Final   Serratia marcescens NOT DETECTED NOT DETECTED Final   Haemophilus influenzae NOT DETECTED NOT DETECTED Final   Neisseria meningitidis NOT DETECTED NOT DETECTED Final   Pseudomonas aeruginosa NOT DETECTED NOT DETECTED Final   Candida albicans NOT DETECTED NOT DETECTED Final   Candida glabrata NOT DETECTED NOT DETECTED Final   Candida krusei NOT DETECTED NOT DETECTED Final   Candida parapsilosis NOT DETECTED NOT DETECTED Final   Candida tropicalis NOT DETECTED NOT DETECTED Final  Culture, blood (routine x 2)     Status: None (Preliminary result)   Collection Time: 02/20/17  4:35  AM  Result Value Ref Range Status   Specimen Description BLOOD RIGHT HAND  Final   Special Requests   Final    BOTTLES DRAWN AEROBIC AND ANAEROBIC Blood Culture adequate volume   Culture NO GROWTH 3 DAYS  Final   Report Status  PENDING  Incomplete  Culture, blood (routine x 2)     Status: None (Preliminary result)   Collection Time: 02/20/17  4:35 AM  Result Value Ref Range Status   Specimen Description BLOOD RIGHT HAND  Final   Special Requests   Final    BOTTLES DRAWN AEROBIC AND ANAEROBIC Blood Culture adequate volume   Culture NO GROWTH 3 DAYS  Final   Report Status PENDING  Incomplete    Anti-infectives:  Anti-infectives    Start     Dose/Rate Route Frequency Ordered Stop   02/19/17 2200  vancomycin (VANCOCIN) IVPB 750 mg/150 ml premix  Status:  Discontinued     750 mg 150 mL/hr over 60 Minutes Intravenous Every 12 hours 02/19/17 0807 02/20/17 1150   02/19/17 0900  vancomycin (VANCOCIN) 2,000 mg in sodium chloride 0.9 % 500 mL IVPB     2,000 mg 250 mL/hr over 120 Minutes Intravenous  Once 02/19/17 0807 02/19/17 1151   02/17/17 1100  cefTRIAXone (ROCEPHIN) 2 g in dextrose 5 % 50 mL IVPB  Status:  Discontinued     2 g 100 mL/hr over 30 Minutes Intravenous Every 24 hours 02/17/17 1041 02/19/17 0801   02/14/17 1500  piperacillin-tazobactam (ZOSYN) IVPB 3.375 g  Status:  Discontinued     3.375 g 12.5 mL/hr over 240 Minutes Intravenous Every 8 hours 02/14/17 1351 02/17/17 1041   02/11/17 1000  piperacillin-tazobactam (ZOSYN) IVPB 3.375 g  Status:  Discontinued     3.375 g 12.5 mL/hr over 240 Minutes Intravenous Every 8 hours 02/11/17 0858 02/14/17 1351   02/11/17 1000  vancomycin (VANCOCIN) IVPB 750 mg/150 ml premix  Status:  Discontinued     750 mg 150 mL/hr over 60 Minutes Intravenous Every 12 hours 02/11/17 0858 02/14/17 0844   02/07/17 2000  ceFAZolin (ANCEF) IVPB 1 g/50 mL premix     1 g 100 mL/hr over 30 Minutes Intravenous Every 8 hours 02/07/17 1416 02/08/17 1543       Best Practice/Protocols:  VTE Prophylaxis: Lovenox (prophylaxtic dose) Continous Sedation  Consults: Treatment Team:  Altamese Stockton, MD    Studies:    Events:  Subjective:    Overnight Issues:   Objective:  Vital signs for last 24 hours: Temp:  [96.8 F (36 C)-97.7 F (36.5 C)] 96.8 F (36 C) (09/06 0600) Pulse Rate:  [43-102] 97 (09/06 0808) Resp:  [20-27] 20 (09/06 0808) BP: (102-142)/(56-85) 135/76 (09/06 0808) SpO2:  [95 %-100 %] 97 % (09/06 0808) FiO2 (%):  [30 %] 30 % (09/06 0808) Weight:  [106.6 kg (235 lb 0.2 oz)] 106.6 kg (235 lb 0.2 oz) (09/06 0500)  Hemodynamic parameters for last 24 hours:    Intake/Output from previous day: 09/05 0701 - 09/06 0700 In: 4027 [I.V.:1732; NG/GT:1980; IV Piggyback:315] Out: 2925 [Urine:2925]  Intake/Output this shift: No intake/output data recorded.  Vent settings for last 24 hours: Vent Mode: PRVC FiO2 (%):  [30 %] 30 % Set Rate:  [20 bmp] 20 bmp Vt Set:  [580 mL] 580 mL PEEP:  [5 cmH20] 5 cmH20 Plateau Pressure:  [23 [id number] cmH20] 29 cmH20  Physical Exam:  General: on vent Neuro: F/C with LUE HEENT/Neck: ETT Resp: clear to auscultation bilaterally CVS: RRR with occ ectopy GI: soft, nontender, BS WNL, no r/g Extremities: edema 3+  Results for orders placed or performed during the hospital encounter of 02/07/17 (from the past 24 hour(s))  Glucose, capillary     Status: Abnormal   Collection Time: 02/22/17 11:59 AM  Result Value Ref Range   Glucose-Capillary 214 (H) 65 - 99 mg/dL  I-STAT 3, arterial blood gas (G3+)     Status: None   Collection Time: 02/22/17  2:17 PM  Result Value Ref Range   pH, Arterial 7.421 7.350 - 7.450   pCO2 arterial 36.3 32.0 - 48.0 mmHg   pO2, Arterial 87.0 83.0 - 108.0 mmHg   Bicarbonate 23.8 20.0 - 28.0 mmol/L   TCO2 25 22 - 32 mmol/L   O2 Saturation 97.0 %   Acid-base deficit 1.0 0.0 - 2.0 mmol/L   Patient temperature 36.2 C    Collection site RADIAL, ALLEN'S TEST  ACCEPTABLE    Drawn by RT    Sample type ARTERIAL   Glucose, capillary     Status: Abnormal   Collection Time: 02/22/17  4:37 PM  Result Value Ref Range   Glucose-Capillary 214 (H) 65 - 99 mg/dL  Glucose, capillary     Status: Abnormal   Collection Time: 02/22/17  8:19 PM  Result Value Ref Range   Glucose-Capillary 199 (H) 65 - 99 mg/dL  Glucose, capillary     Status: Abnormal   Collection Time: 02/22/17 11:46 PM  Result Value Ref Range   Glucose-Capillary 253 (H) 65 - 99 mg/dL  Glucose, capillary     Status: Abnormal   Collection Time: 02/23/17  4:00 AM  Result Value Ref Range   Glucose-Capillary 253 (H) 65 - 99 mg/dL  CBC with Differential/Platelet     Status: Abnormal   Collection Time: 02/23/17  4:14 AM  Result Value Ref Range   WBC 11.2 (H) 4.0 - 10.5 K/uL   RBC 2.72 (L) 4.22 - 5.81 MIL/uL   Hemoglobin 7.8 (L) 13.0 - 17.0 g/dL   HCT 24.2 (L) 39.0 - 52.0 %   MCV 89.0 78.0 - 100.0 fL   MCH 28.7 26.0 - 34.0 pg   MCHC 32.2 30.0 - 36.0 g/dL   RDW 15.6 (H) 11.5 - 15.5 %   Platelets 540 (H) 150 - 400 K/uL   Neutrophils Relative % 79 %   Neutro Abs 8.8 (H) 1.7 - 7.7 K/uL   Lymphocytes Relative 7 %   Lymphs Abs 0.8 0.7 - 4.0 K/uL   Monocytes Relative 12 %   Monocytes Absolute 1.3 (H) 0.1 - 1.0 K/uL   Eosinophils Relative 2 %   Eosinophils Absolute 0.3 0.0 - 0.7 K/uL   Basophils Relative 0 %   Basophils Absolute 0.0 0.0 - 0.1 K/uL  Comprehensive metabolic panel     Status: Abnormal   Collection Time: 02/23/17  4:14 AM  Result Value Ref Range   Sodium 144 135 - 145 mmol/L   Potassium 5.1 3.5 - 5.1 mmol/L   Chloride 116 (H) 101 - 111 mmol/L   CO2 24 22 - 32 mmol/L   Glucose, Bld 265 (H) 65 - 99 mg/dL   BUN 52 (H) 6 - 20 mg/dL   Creatinine, Ser 1.30 (H) 0.61 - 1.24 mg/dL   Calcium 8.2 (L) 8.9 - 10.3 mg/dL   Total Protein 5.4 (L) 6.5 - 8.1 g/dL   Albumin 1.5 (L) 3.5 - 5.0 g/dL   AST 27 15 - 41 U/L   ALT 44 17 - 63 U/L   Alkaline Phosphatase 485 (H) 38 - 126 U/L   Total  Bilirubin 7.3 (H) 0.3 - 1.2 mg/dL   GFR calc non Af Amer 60 (L) >60 mL/min   GFR calc Af Amer >60 >60 mL/min   Anion  gap 4 (L) 5 - 15  Ammonia     Status: None   Collection Time: 02/23/17  4:16 AM  Result Value Ref Range   Ammonia 29 9 - 35 umol/L  Glucose, capillary     Status: Abnormal   Collection Time: 02/23/17  8:11 AM  Result Value Ref Range   Glucose-Capillary 207 (H) 65 - 99 mg/dL    Assessment & Plan: Present on Admission: . Multiple fractures of ribs, bilateral, initial encounter for closed fracture . Multiple closed anterior-posterior compression fractures of pelvis (HCC)    LOS: 16 days   Additional comments:I reviewed the patients new imaging test results. 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 Marcelino Scot 8/21 , S/P symphysis plating by Dr. Marcelino Scot 8/27,  - NWB BLE for 8 weeks, no ROM restrictions  Vent dependent acute hypoxic resp failure- PS this morning, PEEP 5, weaning as tolerated, continue Klon/Sero FEN- remove K from IVF, lasix X 1 IDDM- lantus 10u QHS, ICU glycemic control protocol ID- completed ABX for Klebsiella bactermia, blood CX neg, afeb. PICC placed , D/C TLC Renal - AKI improving VTE- PAS, Lovenox Elevated LFTs- direct hyperbilirubinemia from resorbtion of hematomas - ammonia now WNL, bili 7.3 Dispo- ICU, likely trach next week Critical Care Total Time*: 30 Minutes  Georganna Skeans, MD, MPH, FACS Trauma: [phone] General Surgery: [phone]  02/23/2017  *Care during the described time interval was provided by me. I have reviewed this patient's available data, including medical history, events of note, physical examination and test results as part of my evaluation.  Patient ID: Peter [name], male   DOB:  Feb 20, 1961, 56 y.o.   MRN: [id number]

## 2017-02-23 NOTE — Progress Notes (Signed)
Inpatient Diabetes Program Recommendations  AACE/ADA: New Consensus Statement on Inpatient Glycemic Control (2015)  Target Ranges:  Prepandial:   less than 140 mg/dL      Peak postprandial:   less than 180 mg/dL (1-2 hours)      Critically ill patients:  140 - 180 mg/dL   Lab Results  Component Value Date   GLUCAP 243 (H) 02/23/2017   HGBA1C 8.6 (H) 02/07/2017    Review of Glycemic Control Results for Peter [name], Peter [name] (MRN [id number]) as of 02/23/2017 14:48  Ref. Range 02/22/2017 20:19 02/22/2017 23:46 02/23/2017 04:00 02/23/2017 08:11 02/23/2017 11:25  Glucose-Capillary Latest Ref Range: 65 - 99 mg/dL 199 (H) 253 (H) 253 (H) 207 (H) 243 (H)   Inpatient Diabetes Program Recommendations:   Glucose trends increased into the 200's. Consider Novolog 3 units Q4 hours tube feed coverage.  Thank you, Nani Gasser. Nesta Kimple, RN, MSN, CDE  Diabetes Coordinator Inpatient Glycemic Control Team Team Pager [phone] (8am-5pm) 02/23/2017 2:48 PM
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
## 2017-02-23 NOTE — Progress Notes (Signed)
Peripherally Inserted Central Catheter/Midline Placement  The IV Nurse has discussed with the patient and/or persons authorized to consent for the patient, the purpose of this procedure and the potential benefits and risks involved with this procedure.  The benefits include less needle sticks, lab draws from the catheter, and the patient may be discharged home with the catheter. Risks include, but not limited to, infection, bleeding, blood clot (thrombus formation), and puncture of an artery; nerve damage and irregular heartbeat and possibility to perform a PICC exchange if needed/ordered by physician.  Alternatives to this procedure were also discussed.  Bard Power PICC patient education guide, fact sheet on infection prevention and patient information card has been provided to patient /or left at bedside.    PICC/Midline Placement Documentation     Consent signed by wife at bedside   Synthia Innocent 02/23/2017, 8:59 AM

## 2017-02-24 LAB — BASIC METABOLIC PANEL
Anion gap: 8 (ref 5–15)
BUN: 60 mg/dL — ABNORMAL HIGH (ref 6–20)
CHLORIDE: 113 mmol/L — AB (ref 101–111)
CO2: 23 mmol/L (ref 22–32)
Calcium: 8 mg/dL — ABNORMAL LOW (ref 8.9–10.3)
Creatinine, Ser: 1.5 mg/dL — ABNORMAL HIGH (ref 0.61–1.24)
GFR calc Af Amer: 58 mL/min — ABNORMAL LOW (ref >60)
GFR calc non Af Amer: 50 mL/min — ABNORMAL LOW (ref >60)
Glucose, Bld: 165 mg/dL — ABNORMAL HIGH (ref 65–99)
POTASSIUM: 5.9 mmol/L — AB (ref 3.5–5.1)
Sodium: 144 mmol/L (ref 135–145)

## 2017-02-24 LAB — GLUCOSE, CAPILLARY
GLUCOSE-CAPILLARY: 185 mg/dL — AB (ref 65–99)
GLUCOSE-CAPILLARY: 154 mg/dL — AB (ref 65–99)
GLUCOSE-CAPILLARY: 185 mg/dL — AB (ref 65–99)
GLUCOSE-CAPILLARY: 142 mg/dL — AB (ref 65–99)
Glucose-Capillary: 110 mg/dL — ABNORMAL HIGH (ref 65–99)

## 2017-02-24 MED ORDER — FREE WATER
200.0000 mL | Freq: Four times a day (QID) | Status: DC
  Administered 2017-02-24 – 2017-03-23 (×104): 200 mL

## 2017-02-24 MED ORDER — FUROSEMIDE 10 MG/ML IJ SOLN
40.0000 mg | Freq: Once | INTRAMUSCULAR | Status: AC
Start: 2017-02-24 — End: 2017-02-24
  Administered 2017-02-24: 40 mg via INTRAVENOUS
  Filled 2017-02-24: qty 4

## 2017-02-24 MED ORDER — SODIUM CHLORIDE 0.9 % IV SOLN
1.0000 g | Freq: Once | INTRAVENOUS | Status: AC
  Administered 2017-02-24: 1 g via INTRAVENOUS
  Filled 2017-02-24: qty 10

## 2017-02-24 MED ORDER — METOPROLOL TARTRATE 5 MG/5ML IV SOLN
5.0000 mg | Freq: Three times a day (TID) | INTRAVENOUS | Status: DC
  Administered 2017-02-24 – 2017-03-23 (×79): 5 mg via INTRAVENOUS
  Filled 2017-02-24 (×81): qty 5

## 2017-02-24 MED ORDER — SODIUM POLYSTYRENE SULFONATE 15 GM/60ML PO SUSP
30.0000 g | Freq: Once | ORAL | Status: AC
  Administered 2017-02-24: 30 g
  Filled 2017-02-24: qty 120

## 2017-02-24 NOTE — Progress Notes (Signed)
RN called RT regarding ETT being out 2 cm. RT advanced tube to 24cm. RT will continue to monitor.
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
## 2017-02-24 NOTE — Progress Notes (Signed)
Follow up - Trauma Critical Care  Patient Details:    Peter [name] is an 56 y.o. male.  Lines/tubes : Airway 8 mm (Active)  Secured at (cm) 24 cm 02/24/2017  7:54 AM  Measured From Lips 02/24/2017  7:54 AM  Secured Location Right 02/24/2017  7:54 AM  Secured By [employer] 02/24/2017  7:54 AM  Tube Holder Repositioned Yes 02/24/2017  7:54 AM  Cuff Pressure (cm H2O) 28 cm H2O 02/24/2017  7:54 AM  Site Condition Dry 02/24/2017  7:54 AM     PICC Double Lumen 02/23/17 PICC Right Cephalic 42 cm 0 cm (Active)  Indication for Insertion or Continuance of Line Prolonged intravenous therapies 02/23/2017  8:00 PM  Exposed Catheter (cm) 0 cm 02/23/2017 11:00 AM  Site Assessment Clean;Dry;Intact;Edematous 02/23/2017  8:00 PM  Lumen #1 Status In-line blood sampling system in place 02/23/2017  8:00 PM  Lumen #2 Status Infusing 02/23/2017  8:00 PM  Dressing Type Transparent;Occlusive;Securing device 02/23/2017  8:00 PM  Dressing Status Clean;Dry;Intact;Antimicrobial disc in place 02/23/2017  8:00 PM  Line Care Connections checked and tightened;Zeroed and calibrated 02/23/2017  8:00 PM  Dressing Intervention Other (Comment) 02/23/2017  8:00 PM  Dressing Change Due 03/02/17 02/23/2017  8:00 PM     NG/OG Tube Orogastric 16 Fr. Right mouth Xray (Active)  Cm Marking at Nare/Corner of Mouth (if applicable) 61 cm 08/24/6281  8:00 PM  Site Assessment Clean;Dry;Intact 02/24/2017  8:00 AM  Ongoing Placement Verification No change in cm markings or external length of tube from initial placement;No change in respiratory status;No acute changes, not attributed to clinical condition 02/24/2017  8:00 AM  Status Infusing tube feed 02/24/2017  8:00 AM  Drainage Appearance Green 02/18/2017  5:00 AM  Intake (mL) 30 mL 02/22/2017  4:00 AM  Output (mL) 350 mL 02/20/2017 10:00 AM     Urethral Catheter [name], EMT Double-lumen 16 Fr. (Active)  Indication for Insertion or Continuance of Catheter Bladder outlet obstruction / other urologic reason  02/24/2017  8:00 AM  Site Assessment Edema;Swelling 02/24/2017  8:00 AM  Catheter Maintenance Bag below level of bladder;Catheter secured;Drainage bag/tubing not touching floor;Insertion date on drainage bag;No dependent loops;Seal intact 02/24/2017  8:00 AM  Collection Container Standard drainage bag 02/24/2017  8:00 AM  Securement Method Leg strap 02/23/2017  8:00 PM  Urinary Catheter Interventions Unclamped 02/24/2017  8:00 AM  Input (mL) 600 mL 02/21/2017  6:00 PM  Output (mL) 350 mL 02/24/2017  6:00 AM    Microbiology/Sepsis markers: Results for orders placed or performed during the hospital encounter of 02/07/17  MRSA PCR Screening     Status: None   Collection Time: 02/08/17  4:08 AM  Result Value Ref Range Status   MRSA by PCR NEGATIVE NEGATIVE Final    Comment:        The GeneXpert MRSA Assay (FDA approved for NASAL specimens only), is one component of a comprehensive MRSA colonization surveillance program. It is not intended to diagnose MRSA infection nor to guide or monitor treatment for MRSA infections.   Culture, respiratory (NON-Expectorated)     Status: None   Collection Time: 02/10/17 10:59 AM  Result Value Ref Range Status   Specimen Description TRACHEAL ASPIRATE  Final   Special Requests Normal  Final   Gram Stain   Final    ABUNDANT WBC PRESENT, PREDOMINANTLY PMN RARE SQUAMOUS EPITHELIAL CELLS PRESENT ABUNDANT GRAM NEGATIVE COCCI IN PAIRS ABUNDANT GRAM NEGATIVE RODS MODERATE GRAM POSITIVE COCCI IN CHAINS  Culture ABUNDANT Consistent with normal respiratory flora.  Final   Report Status 02/12/2017 FINAL  Final  Culture, blood (Routine X 2) w Reflex to ID Panel     Status: Abnormal   Collection Time: 02/11/17  3:25 PM  Result Value Ref Range Status   Specimen Description BLOOD RIGHT HAND  Final   Special Requests   Final    BOTTLES DRAWN AEROBIC ONLY Blood Culture adequate volume   Culture  Setup Time   Final    GRAM NEGATIVE RODS AEROBIC BOTTLE ONLY CRITICAL  VALUE NOTED.  VALUE IS CONSISTENT WITH PREVIOUSLY REPORTED AND CALLED VALUE.    Culture (A)  Final    KLEBSIELLA OXYTOCA SUSCEPTIBILITIES PERFORMED ON PREVIOUS CULTURE WITHIN THE LAST 5 DAYS.    Report Status 02/17/2017 FINAL  Final  Culture, blood (Routine X 2) w Reflex to ID Panel     Status: Abnormal   Collection Time: 02/11/17  3:25 PM  Result Value Ref Range Status   Specimen Description BLOOD RIGHT HAND  Final   Special Requests   Final    BOTTLES DRAWN AEROBIC ONLY Blood Culture adequate volume   Culture  Setup Time   Final    GRAM NEGATIVE RODS AEROBIC BOTTLE ONLY CRITICAL RESULT CALLED TO, READ BACK BY AND VERIFIED WITH: J.LEDFORD, PHARMD 02/15/17 0632 L.CHAMPION    Culture KLEBSIELLA OXYTOCA (A)  Final   Report Status 02/17/2017 FINAL  Final   Organism ID, Bacteria KLEBSIELLA OXYTOCA  Final      Susceptibility   Klebsiella oxytoca - MIC*    AMPICILLIN >=32 RESISTANT Resistant     CEFAZOLIN >=64 RESISTANT Resistant     CEFEPIME <=1 SENSITIVE Sensitive     CEFTAZIDIME <=1 SENSITIVE Sensitive     CEFTRIAXONE <=1 SENSITIVE Sensitive     CIPROFLOXACIN <=0.25 SENSITIVE Sensitive     GENTAMICIN <=1 SENSITIVE Sensitive     IMIPENEM <=0.25 SENSITIVE Sensitive     TRIMETH/SULFA <=20 SENSITIVE Sensitive     AMPICILLIN/SULBACTAM 8 SENSITIVE Sensitive     PIP/TAZO <=4 SENSITIVE Sensitive     Extended ESBL NEGATIVE Sensitive     * KLEBSIELLA OXYTOCA  Blood Culture ID Panel (Reflexed)     Status: Abnormal   Collection Time: 02/11/17  3:25 PM  Result Value Ref Range Status   Enterococcus species NOT DETECTED NOT DETECTED Final   Listeria monocytogenes NOT DETECTED NOT DETECTED Final   Staphylococcus species NOT DETECTED NOT DETECTED Final   Staphylococcus aureus NOT DETECTED NOT DETECTED Final   Streptococcus species NOT DETECTED NOT DETECTED Final   Streptococcus agalactiae NOT DETECTED NOT DETECTED Final   Streptococcus pneumoniae NOT DETECTED NOT DETECTED Final    Streptococcus pyogenes NOT DETECTED NOT DETECTED Final   Acinetobacter baumannii NOT DETECTED NOT DETECTED Final   Enterobacteriaceae species DETECTED (A) NOT DETECTED Final    Comment: Enterobacteriaceae represent a large family of gram-negative bacteria, not a single organism. CRITICAL RESULT CALLED TO, READ BACK BY AND VERIFIED WITH: J.LEDFORD, PHARMD 02/15/17 0632 L.CHAMPION    Enterobacter cloacae complex NOT DETECTED NOT DETECTED Final   Escherichia coli NOT DETECTED NOT DETECTED Final   Klebsiella oxytoca DETECTED (A) NOT DETECTED Final    Comment: CRITICAL RESULT CALLED TO, READ BACK BY AND VERIFIED WITH: J.LEDFORD, PHARMD 02/15/17 0632 L.CHAMPION    Klebsiella pneumoniae NOT DETECTED NOT DETECTED Final   Proteus species NOT DETECTED NOT DETECTED Final   Serratia marcescens NOT DETECTED NOT DETECTED Final   Carbapenem resistance NOT DETECTED NOT  DETECTED Final   Haemophilus influenzae NOT DETECTED NOT DETECTED Final   Neisseria meningitidis NOT DETECTED NOT DETECTED Final   Pseudomonas aeruginosa NOT DETECTED NOT DETECTED Final   Candida albicans NOT DETECTED NOT DETECTED Final   Candida glabrata NOT DETECTED NOT DETECTED Final   Candida krusei NOT DETECTED NOT DETECTED Final   Candida parapsilosis NOT DETECTED NOT DETECTED Final   Candida tropicalis NOT DETECTED NOT DETECTED Final  Surgical PCR screen     Status: None   Collection Time: 02/13/17  6:30 AM  Result Value Ref Range Status   MRSA, PCR NEGATIVE NEGATIVE Final   Staphylococcus aureus NEGATIVE NEGATIVE Final    Comment:        The Xpert SA Assay (FDA approved for NASAL specimens in patients over 69 years of age), is one component of a comprehensive surveillance program.  Test performance has been validated by [facility] for patients greater than or equal to 66 year old. It is not intended to diagnose infection nor to guide or monitor treatment.   Culture, blood (Routine X 2) w Reflex to ID Panel     Status:  None   Collection Time: 02/18/17  2:52 PM  Result Value Ref Range Status   Specimen Description BLOOD RIGHT HAND  Final   Special Requests IN PEDIATRIC BOTTLE Blood Culture adequate volume  Final   Culture NO GROWTH 5 DAYS  Final   Report Status 02/23/2017 FINAL  Final  Culture, blood (Routine X 2) w Reflex to ID Panel     Status: Abnormal   Collection Time: 02/18/17  2:55 PM  Result Value Ref Range Status   Specimen Description BLOOD RIGHT WRIST  Final   Special Requests IN PEDIATRIC BOTTLE Blood Culture adequate volume  Final   Culture  Setup Time   Final    IN PEDIATRIC BOTTLE GRAM POSITIVE RODS CRITICAL RESULT CALLED TO, READ BACK BY AND VERIFIED WITH: L FOLTANSKI,PHARMD AT 0730 02/19/17 BY L BENFIELD    Culture (A)  Final    BACILLUS SPECIES Standardized susceptibility testing for this organism is not available. ENTEROCOCCUS DETECTED ON BCID, NOT RECOVERED IN CULTURE    Report Status 02/21/2017 FINAL  Final  Blood Culture ID Panel (Reflexed)     Status: Abnormal   Collection Time: 02/18/17  2:55 PM  Result Value Ref Range Status   Enterococcus species DETECTED (A) NOT DETECTED Final    Comment: CRITICAL RESULT CALLED TO, READ BACK BY AND VERIFIED WITH: L FOLTANSKI,PHARMD AT 0730 02/19/17 BY L BENFIELD    Vancomycin resistance NOT DETECTED NOT DETECTED Final   Listeria monocytogenes NOT DETECTED NOT DETECTED Final   Staphylococcus species NOT DETECTED NOT DETECTED Final   Staphylococcus aureus NOT DETECTED NOT DETECTED Final   Streptococcus species NOT DETECTED NOT DETECTED Final   Streptococcus agalactiae NOT DETECTED NOT DETECTED Final   Streptococcus pneumoniae NOT DETECTED NOT DETECTED Final   Streptococcus pyogenes NOT DETECTED NOT DETECTED Final   Acinetobacter baumannii NOT DETECTED NOT DETECTED Final   Enterobacteriaceae species NOT DETECTED NOT DETECTED Final   Enterobacter cloacae complex NOT DETECTED NOT DETECTED Final   Escherichia coli NOT DETECTED NOT DETECTED  Final   Klebsiella oxytoca NOT DETECTED NOT DETECTED Final   Klebsiella pneumoniae NOT DETECTED NOT DETECTED Final   Proteus species NOT DETECTED NOT DETECTED Final   Serratia marcescens NOT DETECTED NOT DETECTED Final   Haemophilus influenzae NOT DETECTED NOT DETECTED Final   Neisseria meningitidis NOT DETECTED NOT DETECTED Final  Pseudomonas aeruginosa NOT DETECTED NOT DETECTED Final   Candida albicans NOT DETECTED NOT DETECTED Final   Candida glabrata NOT DETECTED NOT DETECTED Final   Candida krusei NOT DETECTED NOT DETECTED Final   Candida parapsilosis NOT DETECTED NOT DETECTED Final   Candida tropicalis NOT DETECTED NOT DETECTED Final  Culture, blood (routine x 2)     Status: None (Preliminary result)   Collection Time: 02/20/17  4:35 AM  Result Value Ref Range Status   Specimen Description BLOOD RIGHT HAND  Final   Special Requests   Final    BOTTLES DRAWN AEROBIC AND ANAEROBIC Blood Culture adequate volume   Culture NO GROWTH 3 DAYS  Final   Report Status PENDING  Incomplete  Culture, blood (routine x 2)     Status: None (Preliminary result)   Collection Time: 02/20/17  4:35 AM  Result Value Ref Range Status   Specimen Description BLOOD RIGHT HAND  Final   Special Requests   Final    BOTTLES DRAWN AEROBIC AND ANAEROBIC Blood Culture adequate volume   Culture NO GROWTH 3 DAYS  Final   Report Status PENDING  Incomplete    Anti-infectives:  Anti-infectives    Start     Dose/Rate Route Frequency Ordered Stop   02/19/17 2200  vancomycin (VANCOCIN) IVPB 750 mg/150 ml premix  Status:  Discontinued     750 mg 150 mL/hr over 60 Minutes Intravenous Every 12 hours 02/19/17 0807 02/20/17 1150   02/19/17 0900  vancomycin (VANCOCIN) 2,000 mg in sodium chloride 0.9 % 500 mL IVPB     2,000 mg 250 mL/hr over 120 Minutes Intravenous  Once 02/19/17 0807 02/19/17 1151   02/17/17 1100  cefTRIAXone (ROCEPHIN) 2 g in dextrose 5 % 50 mL IVPB  Status:  Discontinued     2 g 100 mL/hr over 30  Minutes Intravenous Every 24 hours 02/17/17 1041 02/19/17 0801   02/14/17 1500  piperacillin-tazobactam (ZOSYN) IVPB 3.375 g  Status:  Discontinued     3.375 g 12.5 mL/hr over 240 Minutes Intravenous Every 8 hours 02/14/17 1351 02/17/17 1041   02/11/17 1000  piperacillin-tazobactam (ZOSYN) IVPB 3.375 g  Status:  Discontinued     3.375 g 12.5 mL/hr over 240 Minutes Intravenous Every 8 hours 02/11/17 0858 02/14/17 1351   02/11/17 1000  vancomycin (VANCOCIN) IVPB 750 mg/150 ml premix  Status:  Discontinued     750 mg 150 mL/hr over 60 Minutes Intravenous Every 12 hours 02/11/17 0858 02/14/17 0844   02/07/17 2000  ceFAZolin (ANCEF) IVPB 1 g/50 mL premix     1 g 100 mL/hr over 30 Minutes Intravenous Every 8 hours 02/07/17 1416 02/08/17 1543      Best Practice/Protocols:  VTE Prophylaxis: Lovenox (prophylaxtic dose) Continous Sedation  Consults: Treatment Team:  Altamese Avon, MD    Studies:    Events:  Subjective:    Overnight Issues:   Objective:  Vital signs for last 24 hours: Temp:  [97.2 F (36.2 C)-100.9 F (38.3 C)] 100.4 F (38 C) (09/07 0800) Pulse Rate:  [46-112] 111 (09/07 0800) Resp:  [19-29] 28 (09/07 0800) BP: (109-163)/(56-84) 163/64 (09/07 0800) SpO2:  [93 %-100 %] 94 % (09/07 0800) FiO2 (%):  [30 %] 30 % (09/07 0754) Weight:  [104.3 kg (229 lb 15 oz)] 104.3 kg (229 lb 15 oz) (09/07 0400)  Hemodynamic parameters for last 24 hours:    Intake/Output from previous day: 09/06 0701 - 09/07 0700 In: 3758.4 [I.V.:1550.1; NG/GT:2103.3; IV Piggyback:105] Out: 4600 [Urine:4600]  Intake/Output this shift: Total I/O In: 105 [IV Piggyback:105] Out: -   Vent settings for last 24 hours: Vent Mode: CPAP;PSV FiO2 (%):  [30 %] 30 % Set Rate:  [20 bmp] 20 bmp Vt Set:  [580 mL] 580 mL PEEP:  [5 cmH20] 5 cmH20 Pressure Support:  [5 cmH20-8 cmH20] 8 cmH20 Plateau Pressure:  [9 cmH20-25 cmH20] 21 cmH20  Physical Exam:  General: on vent Neuro: arouses and  F/C HEENT/Neck: ETT Resp: clear to auscultation bilaterally CVS: RRR GI: soft, NT +BS Extremities: edema 3+  Results for orders placed or performed during the hospital encounter of 02/07/17 (from the past 24 hour(s))  Glucose, capillary     Status: Abnormal   Collection Time: 02/23/17 11:25 AM  Result Value Ref Range   Glucose-Capillary 243 (H) 65 - 99 mg/dL   Comment 1 Notify RN    Comment 2 Document in Chart   Glucose, capillary     Status: Abnormal   Collection Time: 02/23/17  3:55 PM  Result Value Ref Range   Glucose-Capillary 247 (H) 65 - 99 mg/dL  Glucose, capillary     Status: Abnormal   Collection Time: 02/23/17  7:56 PM  Result Value Ref Range   Glucose-Capillary 196 (H) 65 - 99 mg/dL   Comment 1 Notify RN    Comment 2 Document in Chart   Glucose, capillary     Status: Abnormal   Collection Time: 02/24/17 12:02 AM  Result Value Ref Range   Glucose-Capillary 210 (H) 65 - 99 mg/dL   Comment 1 Notify RN    Comment 2 Document in Chart   Glucose, capillary     Status: Abnormal   Collection Time: 02/24/17  4:29 AM  Result Value Ref Range   Glucose-Capillary 185 (H) 65 - 99 mg/dL   Comment 1 Notify RN    Comment 2 Document in Chart   Basic metabolic panel     Status: Abnormal   Collection Time: 02/24/17  6:01 AM  Result Value Ref Range   Sodium 144 135 - 145 mmol/L   Potassium 5.9 (H) 3.5 - 5.1 mmol/L   Chloride 113 (H) 101 - 111 mmol/L   CO2 23 22 - 32 mmol/L   Glucose, Bld 165 (H) 65 - 99 mg/dL   BUN 60 (H) 6 - 20 mg/dL   Creatinine, Ser 1.50 (H) 0.61 - 1.24 mg/dL   Calcium 8.0 (L) 8.9 - 10.3 mg/dL   GFR calc non Af Amer 50 (L) >60 mL/min   GFR calc Af Amer 58 (L) >60 mL/min   Anion gap 8 5 - 15  Glucose, capillary     Status: Abnormal   Collection Time: 02/24/17  7:57 AM  Result Value Ref Range   Glucose-Capillary 154 (H) 65 - 99 mg/dL    Assessment & Plan: Present on Admission: . Multiple fractures of ribs, bilateral, initial encounter for closed  fracture . Multiple closed anterior-posterior compression fractures of pelvis (HCC)    LOS: 17 days   Additional comments:I reviewed the patient's new clinical lab test results. 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 Marcelino Scot 8/21 , S/P symphysis plating by Dr. Marcelino Scot 8/27,  - NWB BLE for 8  weeks, no ROM restrictions  Vent dependent acute hypoxic resp failure- PS this morning, PEEP 5, weaning as tolerated, continue Klon/Sero. Trial of extubation soon vs Trach next week FEN- hyperkalemia - kayexalate/lasix/Ca gluc. Decrease free water. IDDM- lantus and SSI ID- observe off ABX Renal - AKI improving VTE- PAS, Lovenox Elevated LFTs- direct hyperbilirubinemia from resorbtion of hematomas - ammonia now WNL, bili 7.3 Dispo- ICU Critical Care Total Time*: 31 Minutes  Georganna Skeans, MD, MPH, FACS Trauma: [phone] General Surgery: [phone]  02/24/2017  *Care during the described time interval was provided by me. I have reviewed this patient's available data, including medical history, events of note, physical examination and test results as part of my evaluation.  Patient ID: Mikle Sternberg, male   DOB: 1960/07/31, 56 y.o.   MRN: [id number]

## 2017-02-24 NOTE — Progress Notes (Signed)
Patient ID: Peter [name], male   DOB: December 25, 1960, 56 y.o.   MRN: [id number] I spoke with his daughter at the bedside. Georganna Skeans, MD, MPH, FACS Trauma: [phone] General Surgery: [phone]

## 2017-02-25 ENCOUNTER — Encounter (HOSPITAL_COMMUNITY): Payer: Self-pay

## 2017-02-25 ENCOUNTER — Inpatient Hospital Stay (HOSPITAL_COMMUNITY): Payer: BLUE CROSS/BLUE SHIELD

## 2017-02-25 LAB — CULTURE, BLOOD (ROUTINE X 2)
CULTURE: NO GROWTH
CULTURE: NO GROWTH
Special Requests: ADEQUATE
Special Requests: ADEQUATE

## 2017-02-25 LAB — GLUCOSE, CAPILLARY
GLUCOSE-CAPILLARY: 110 mg/dL — AB (ref 65–99)
GLUCOSE-CAPILLARY: 123 mg/dL — AB (ref 65–99)
GLUCOSE-CAPILLARY: 164 mg/dL — AB (ref 65–99)
GLUCOSE-CAPILLARY: 168 mg/dL — AB (ref 65–99)
GLUCOSE-CAPILLARY: 175 mg/dL — AB (ref 65–99)
GLUCOSE-CAPILLARY: 95 mg/dL (ref 65–99)

## 2017-02-25 LAB — CBC
HCT: 21.4 % — ABNORMAL LOW (ref 39.0–52.0)
Hemoglobin: 7 g/dL — ABNORMAL LOW (ref 13.0–17.0)
MCH: 29.3 pg (ref 26.0–34.0)
MCHC: 32.7 g/dL (ref 30.0–36.0)
MCV: 89.5 fL (ref 78.0–100.0)
Platelets: 454 K/uL — ABNORMAL HIGH (ref 150–400)
RBC: 2.39 MIL/uL — ABNORMAL LOW (ref 4.22–5.81)
RDW: 15.1 % (ref 11.5–15.5)
WBC: 10.1 K/uL (ref 4.0–10.5)

## 2017-02-25 LAB — BASIC METABOLIC PANEL
Anion gap: 6 (ref 5–15)
BUN: 58 mg/dL — ABNORMAL HIGH (ref 6–20)
CO2: 26 mmol/L (ref 22–32)
CREATININE: 1.51 mg/dL — AB (ref 0.61–1.24)
Calcium: 7.7 mg/dL — ABNORMAL LOW (ref 8.9–10.3)
Chloride: 113 mmol/L — ABNORMAL HIGH (ref 101–111)
GFR, EST AFRICAN AMERICAN: 58 mL/min — AB (ref >60)
GFR, EST NON AFRICAN AMERICAN: 50 mL/min — AB (ref >60)
Glucose, Bld: 112 mg/dL — ABNORMAL HIGH (ref 65–99)
Potassium: 5.1 mmol/L (ref 3.5–5.1)
SODIUM: 145 mmol/L (ref 135–145)

## 2017-02-25 MED ORDER — CLONAZEPAM 1 MG PO TABS
1.0000 mg | ORAL_TABLET | Freq: Two times a day (BID) | ORAL | Status: DC
  Administered 2017-02-25 – 2017-03-06 (×18): 1 mg
  Filled 2017-02-25 (×18): qty 1

## 2017-02-25 NOTE — Progress Notes (Signed)
Follow up - Trauma Critical Care  Patient Details:    Peter [name] is an 56 y.o. male.  Lines/tubes : Airway 8 mm (Active)  Secured at (cm) 24 cm 02/24/2017  7:54 AM  Measured From Lips 02/24/2017  7:54 AM  Secured Location Right 02/24/2017  7:54 AM  Secured By [employer] 02/24/2017  7:54 AM  Tube Holder Repositioned Yes 02/24/2017  7:54 AM  Cuff Pressure (cm H2O) 28 cm H2O 02/24/2017  7:54 AM  Site Condition Dry 02/24/2017  7:54 AM     PICC Double Lumen 02/23/17 PICC Right Cephalic 42 cm 0 cm (Active)  Indication for Insertion or Continuance of Line Prolonged intravenous therapies 02/23/2017  8:00 PM  Exposed Catheter (cm) 0 cm 02/23/2017 11:00 AM  Site Assessment Clean;Dry;Intact;Edematous 02/23/2017  8:00 PM  Lumen #1 Status In-line blood sampling system in place 02/23/2017  8:00 PM  Lumen #2 Status Infusing 02/23/2017  8:00 PM  Dressing Type Transparent;Occlusive;Securing device 02/23/2017  8:00 PM  Dressing Status Clean;Dry;Intact;Antimicrobial disc in place 02/23/2017  8:00 PM  Line Care Connections checked and tightened;Zeroed and calibrated 02/23/2017  8:00 PM  Dressing Intervention Other (Comment) 02/23/2017  8:00 PM  Dressing Change Due 03/02/17 02/23/2017  8:00 PM     NG/OG Tube Orogastric 16 Fr. Right mouth Xray (Active)  Cm Marking at Nare/Corner of Mouth (if applicable) 61 cm 0/11/2374  8:00 PM  Site Assessment Clean;Dry;Intact 02/24/2017  8:00 AM  Ongoing Placement Verification No change in cm markings or external length of tube from initial placement;No change in respiratory status;No acute changes, not attributed to clinical condition 02/24/2017  8:00 AM  Status Infusing tube feed 02/24/2017  8:00 AM  Drainage Appearance Green 02/18/2017  5:00 AM  Intake (mL) 30 mL 02/22/2017  4:00 AM  Output (mL) 350 mL 02/20/2017 10:00 AM     Urethral Catheter [name], EMT Double-lumen 16 Fr. (Active)  Indication for Insertion or Continuance of Catheter Bladder outlet obstruction / other urologic reason  02/24/2017  8:00 AM  Site Assessment Edema;Swelling 02/24/2017  8:00 AM  Catheter Maintenance Bag below level of bladder;Catheter secured;Drainage bag/tubing not touching floor;Insertion date on drainage bag;No dependent loops;Seal intact 02/24/2017  8:00 AM  Collection Container Standard drainage bag 02/24/2017  8:00 AM  Securement Method Leg strap 02/23/2017  8:00 PM  Urinary Catheter Interventions Unclamped 02/24/2017  8:00 AM  Input (mL) 600 mL 02/21/2017  6:00 PM  Output (mL) 350 mL 02/24/2017  6:00 AM    Microbiology/Sepsis markers: Results for orders placed or performed during the hospital encounter of 02/07/17  MRSA PCR Screening     Status: None   Collection Time: 02/08/17  4:08 AM  Result Value Ref Range Status   MRSA by PCR NEGATIVE NEGATIVE Final    Comment:        The GeneXpert MRSA Assay (FDA approved for NASAL specimens only), is one component of a comprehensive MRSA colonization surveillance program. It is not intended to diagnose MRSA infection nor to guide or monitor treatment for MRSA infections.   Culture, respiratory (NON-Expectorated)     Status: None   Collection Time: 02/10/17 10:59 AM  Result Value Ref Range Status   Specimen Description TRACHEAL ASPIRATE  Final   Special Requests Normal  Final   Gram Stain   Final    ABUNDANT WBC PRESENT, PREDOMINANTLY PMN RARE SQUAMOUS EPITHELIAL CELLS PRESENT ABUNDANT GRAM NEGATIVE COCCI IN PAIRS ABUNDANT GRAM NEGATIVE RODS MODERATE GRAM POSITIVE COCCI IN CHAINS  Culture ABUNDANT Consistent with normal respiratory flora.  Final   Report Status 02/12/2017 FINAL  Final  Culture, blood (Routine X 2) w Reflex to ID Panel     Status: Abnormal   Collection Time: 02/11/17  3:25 PM  Result Value Ref Range Status   Specimen Description BLOOD RIGHT HAND  Final   Special Requests   Final    BOTTLES DRAWN AEROBIC ONLY Blood Culture adequate volume   Culture  Setup Time   Final    GRAM NEGATIVE RODS AEROBIC BOTTLE ONLY CRITICAL  VALUE NOTED.  VALUE IS CONSISTENT WITH PREVIOUSLY REPORTED AND CALLED VALUE.    Culture (A)  Final    KLEBSIELLA OXYTOCA SUSCEPTIBILITIES PERFORMED ON PREVIOUS CULTURE WITHIN THE LAST 5 DAYS.    Report Status 02/17/2017 FINAL  Final  Culture, blood (Routine X 2) w Reflex to ID Panel     Status: Abnormal   Collection Time: 02/11/17  3:25 PM  Result Value Ref Range Status   Specimen Description BLOOD RIGHT HAND  Final   Special Requests   Final    BOTTLES DRAWN AEROBIC ONLY Blood Culture adequate volume   Culture  Setup Time   Final    GRAM NEGATIVE RODS AEROBIC BOTTLE ONLY CRITICAL RESULT CALLED TO, READ BACK BY AND VERIFIED WITH: J.LEDFORD, PHARMD 02/15/17 0632 L.CHAMPION    Culture KLEBSIELLA OXYTOCA (A)  Final   Report Status 02/17/2017 FINAL  Final   Organism ID, Bacteria KLEBSIELLA OXYTOCA  Final      Susceptibility   Klebsiella oxytoca - MIC*    AMPICILLIN >=32 RESISTANT Resistant     CEFAZOLIN >=64 RESISTANT Resistant     CEFEPIME <=1 SENSITIVE Sensitive     CEFTAZIDIME <=1 SENSITIVE Sensitive     CEFTRIAXONE <=1 SENSITIVE Sensitive     CIPROFLOXACIN <=0.25 SENSITIVE Sensitive     GENTAMICIN <=1 SENSITIVE Sensitive     IMIPENEM <=0.25 SENSITIVE Sensitive     TRIMETH/SULFA <=20 SENSITIVE Sensitive     AMPICILLIN/SULBACTAM 8 SENSITIVE Sensitive     PIP/TAZO <=4 SENSITIVE Sensitive     Extended ESBL NEGATIVE Sensitive     * KLEBSIELLA OXYTOCA  Blood Culture ID Panel (Reflexed)     Status: Abnormal   Collection Time: 02/11/17  3:25 PM  Result Value Ref Range Status   Enterococcus species NOT DETECTED NOT DETECTED Final   Listeria monocytogenes NOT DETECTED NOT DETECTED Final   Staphylococcus species NOT DETECTED NOT DETECTED Final   Staphylococcus aureus NOT DETECTED NOT DETECTED Final   Streptococcus species NOT DETECTED NOT DETECTED Final   Streptococcus agalactiae NOT DETECTED NOT DETECTED Final   Streptococcus pneumoniae NOT DETECTED NOT DETECTED Final    Streptococcus pyogenes NOT DETECTED NOT DETECTED Final   Acinetobacter baumannii NOT DETECTED NOT DETECTED Final   Enterobacteriaceae species DETECTED (A) NOT DETECTED Final    Comment: Enterobacteriaceae represent a large family of gram-negative bacteria, not a single organism. CRITICAL RESULT CALLED TO, READ BACK BY AND VERIFIED WITH: J.LEDFORD, PHARMD 02/15/17 0632 L.CHAMPION    Enterobacter cloacae complex NOT DETECTED NOT DETECTED Final   Escherichia coli NOT DETECTED NOT DETECTED Final   Klebsiella oxytoca DETECTED (A) NOT DETECTED Final    Comment: CRITICAL RESULT CALLED TO, READ BACK BY AND VERIFIED WITH: J.LEDFORD, PHARMD 02/15/17 0632 L.CHAMPION    Klebsiella pneumoniae NOT DETECTED NOT DETECTED Final   Proteus species NOT DETECTED NOT DETECTED Final   Serratia marcescens NOT DETECTED NOT DETECTED Final   Carbapenem resistance NOT DETECTED NOT  DETECTED Final   Haemophilus influenzae NOT DETECTED NOT DETECTED Final   Neisseria meningitidis NOT DETECTED NOT DETECTED Final   Pseudomonas aeruginosa NOT DETECTED NOT DETECTED Final   Candida albicans NOT DETECTED NOT DETECTED Final   Candida glabrata NOT DETECTED NOT DETECTED Final   Candida krusei NOT DETECTED NOT DETECTED Final   Candida parapsilosis NOT DETECTED NOT DETECTED Final   Candida tropicalis NOT DETECTED NOT DETECTED Final  Surgical PCR screen     Status: None   Collection Time: 02/13/17  6:30 AM  Result Value Ref Range Status   MRSA, PCR NEGATIVE NEGATIVE Final   Staphylococcus aureus NEGATIVE NEGATIVE Final    Comment:        The Xpert SA Assay (FDA approved for NASAL specimens in patients over 50 years of age), is one component of a comprehensive surveillance program.  Test performance has been validated by [facility] for patients greater than or equal to 79 year old. It is not intended to diagnose infection nor to guide or monitor treatment.   Culture, blood (Routine X 2) w Reflex to ID Panel     Status:  None   Collection Time: 02/18/17  2:52 PM  Result Value Ref Range Status   Specimen Description BLOOD RIGHT HAND  Final   Special Requests IN PEDIATRIC BOTTLE Blood Culture adequate volume  Final   Culture NO GROWTH 5 DAYS  Final   Report Status 02/23/2017 FINAL  Final  Culture, blood (Routine X 2) w Reflex to ID Panel     Status: Abnormal   Collection Time: 02/18/17  2:55 PM  Result Value Ref Range Status   Specimen Description BLOOD RIGHT WRIST  Final   Special Requests IN PEDIATRIC BOTTLE Blood Culture adequate volume  Final   Culture  Setup Time   Final    IN PEDIATRIC BOTTLE GRAM POSITIVE RODS CRITICAL RESULT CALLED TO, READ BACK BY AND VERIFIED WITH: L FOLTANSKI,PHARMD AT 0730 02/19/17 BY L BENFIELD    Culture (A)  Final    BACILLUS SPECIES Standardized susceptibility testing for this organism is not available. ENTEROCOCCUS DETECTED ON BCID, NOT RECOVERED IN CULTURE    Report Status 02/21/2017 FINAL  Final  Blood Culture ID Panel (Reflexed)     Status: Abnormal   Collection Time: 02/18/17  2:55 PM  Result Value Ref Range Status   Enterococcus species DETECTED (A) NOT DETECTED Final    Comment: CRITICAL RESULT CALLED TO, READ BACK BY AND VERIFIED WITH: L FOLTANSKI,PHARMD AT 0730 02/19/17 BY L BENFIELD    Vancomycin resistance NOT DETECTED NOT DETECTED Final   Listeria monocytogenes NOT DETECTED NOT DETECTED Final   Staphylococcus species NOT DETECTED NOT DETECTED Final   Staphylococcus aureus NOT DETECTED NOT DETECTED Final   Streptococcus species NOT DETECTED NOT DETECTED Final   Streptococcus agalactiae NOT DETECTED NOT DETECTED Final   Streptococcus pneumoniae NOT DETECTED NOT DETECTED Final   Streptococcus pyogenes NOT DETECTED NOT DETECTED Final   Acinetobacter baumannii NOT DETECTED NOT DETECTED Final   Enterobacteriaceae species NOT DETECTED NOT DETECTED Final   Enterobacter cloacae complex NOT DETECTED NOT DETECTED Final   Escherichia coli NOT DETECTED NOT DETECTED  Final   Klebsiella oxytoca NOT DETECTED NOT DETECTED Final   Klebsiella pneumoniae NOT DETECTED NOT DETECTED Final   Proteus species NOT DETECTED NOT DETECTED Final   Serratia marcescens NOT DETECTED NOT DETECTED Final   Haemophilus influenzae NOT DETECTED NOT DETECTED Final   Neisseria meningitidis NOT DETECTED NOT DETECTED Final  Pseudomonas aeruginosa NOT DETECTED NOT DETECTED Final   Candida albicans NOT DETECTED NOT DETECTED Final   Candida glabrata NOT DETECTED NOT DETECTED Final   Candida krusei NOT DETECTED NOT DETECTED Final   Candida parapsilosis NOT DETECTED NOT DETECTED Final   Candida tropicalis NOT DETECTED NOT DETECTED Final  Culture, blood (routine x 2)     Status: None   Collection Time: 02/20/17  4:35 AM  Result Value Ref Range Status   Specimen Description BLOOD RIGHT HAND  Final   Special Requests   Final    BOTTLES DRAWN AEROBIC AND ANAEROBIC Blood Culture adequate volume   Culture NO GROWTH 5 DAYS  Final   Report Status 02/25/2017 FINAL  Final  Culture, blood (routine x 2)     Status: None   Collection Time: 02/20/17  4:35 AM  Result Value Ref Range Status   Specimen Description BLOOD RIGHT HAND  Final   Special Requests   Final    BOTTLES DRAWN AEROBIC AND ANAEROBIC Blood Culture adequate volume   Culture NO GROWTH 5 DAYS  Final   Report Status 02/25/2017 FINAL  Final    Anti-infectives:  Anti-infectives    Start     Dose/Rate Route Frequency Ordered Stop   02/19/17 2200  vancomycin (VANCOCIN) IVPB 750 mg/150 ml premix  Status:  Discontinued     750 mg 150 mL/hr over 60 Minutes Intravenous Every 12 hours 02/19/17 0807 02/20/17 1150   02/19/17 0900  vancomycin (VANCOCIN) 2,000 mg in sodium chloride 0.9 % 500 mL IVPB     2,000 mg 250 mL/hr over 120 Minutes Intravenous  Once 02/19/17 0807 02/19/17 1151   02/17/17 1100  cefTRIAXone (ROCEPHIN) 2 g in dextrose 5 % 50 mL IVPB  Status:  Discontinued     2 g 100 mL/hr over 30 Minutes Intravenous Every 24 hours  02/17/17 1041 02/19/17 0801   02/14/17 1500  piperacillin-tazobactam (ZOSYN) IVPB 3.375 g  Status:  Discontinued     3.375 g 12.5 mL/hr over 240 Minutes Intravenous Every 8 hours 02/14/17 1351 02/17/17 1041   02/11/17 1000  piperacillin-tazobactam (ZOSYN) IVPB 3.375 g  Status:  Discontinued     3.375 g 12.5 mL/hr over 240 Minutes Intravenous Every 8 hours 02/11/17 0858 02/14/17 1351   02/11/17 1000  vancomycin (VANCOCIN) IVPB 750 mg/150 ml premix  Status:  Discontinued     750 mg 150 mL/hr over 60 Minutes Intravenous Every 12 hours 02/11/17 0858 02/14/17 0844   02/07/17 2000  ceFAZolin (ANCEF) IVPB 1 g/50 mL premix     1 g 100 mL/hr over 30 Minutes Intravenous Every 8 hours 02/07/17 1416 02/08/17 1543      Best Practice/Protocols:  VTE Prophylaxis: Lovenox (prophylaxtic dose) Continous Sedation  Consults: Treatment Team:  Altamese Naches, MD    Studies:    Events:  Subjective:    Overnight Issues:  No change  Objective:  Vital signs for last 24 hours: Temp:  [97.7 F (36.5 C)-99.9 F (37.7 C)] 99 F (37.2 C) (09/08 1000) Pulse Rate:  [42-112] 47 (09/08 1000) Resp:  [16-24] 20 (09/08 1000) BP: (97-142)/(43-91) 120/62 (09/08 1000) SpO2:  [95 %-100 %] 99 % (09/08 1000) FiO2 (%):  [30 %] 30 % (09/08 0800) Weight:  [96.4 kg (212 lb 8.4 oz)] 96.4 kg (212 lb 8.4 oz) (09/08 0500)  Hemodynamic parameters for last 24 hours:    Intake/Output from previous day: 09/07 0701 - 09/08 0700 In: 3016.1 [I.V.:1628.2; NG/GT:1072.9; IV Piggyback:315] Out: 2505 [LZJQB:3419;  Emesis/NG output:100; Stool:4]  Intake/Output this shift: Total I/O In: -  Out: 350 [Urine:350]  Vent settings for last 24 hours: Vent Mode: PRVC FiO2 (%):  [30 %] 30 % Set Rate:  [20 bmp] 20 bmp Vt Set:  [580 mL] 580 mL PEEP:  [5 cmH20] 5 cmH20 Plateau Pressure:  [18 ONG29-52 cmH20] 18 cmH20  Physical Exam:  General: on vent Neuro: arouses and F/C HEENT/Neck: ETT Resp: clear to auscultation  bilaterally CVS: RRR GI: soft, NT +BS Extremities: edema 3+  Results for orders placed or performed during the hospital encounter of 02/07/17 (from the past 24 hour(s))  Glucose, capillary     Status: Abnormal   Collection Time: 02/24/17 12:11 PM  Result Value Ref Range   Glucose-Capillary 185 (H) 65 - 99 mg/dL  Glucose, capillary     Status: Abnormal   Collection Time: 02/24/17  3:51 PM  Result Value Ref Range   Glucose-Capillary 142 (H) 65 - 99 mg/dL  Glucose, capillary     Status: Abnormal   Collection Time: 02/24/17  8:20 PM  Result Value Ref Range   Glucose-Capillary 110 (H) 65 - 99 mg/dL   Comment 1 Notify RN    Comment 2 Document in Chart   Glucose, capillary     Status: Abnormal   Collection Time: 02/25/17 12:10 AM  Result Value Ref Range   Glucose-Capillary 123 (H) 65 - 99 mg/dL   Comment 1 Notify RN    Comment 2 Document in Chart   Glucose, capillary     Status: None   Collection Time: 02/25/17  3:34 AM  Result Value Ref Range   Glucose-Capillary 95 65 - 99 mg/dL   Comment 1 Notify RN    Comment 2 Document in Chart   CBC     Status: Abnormal   Collection Time: 02/25/17  5:08 AM  Result Value Ref Range   WBC 10.1 4.0 - 10.5 K/uL   RBC 2.39 (L) 4.22 - 5.81 MIL/uL   Hemoglobin 7.0 (L) 13.0 - 17.0 g/dL   HCT 21.4 (L) 39.0 - 52.0 %   MCV 89.5 78.0 - 100.0 fL   MCH 29.3 26.0 - 34.0 pg   MCHC 32.7 30.0 - 36.0 g/dL   RDW 15.1 11.5 - 15.5 %   Platelets 454 (H) 150 - 400 K/uL  Basic metabolic panel     Status: Abnormal   Collection Time: 02/25/17  5:08 AM  Result Value Ref Range   Sodium 145 135 - 145 mmol/L   Potassium 5.1 3.5 - 5.1 mmol/L   Chloride 113 (H) 101 - 111 mmol/L   CO2 26 22 - 32 mmol/L   Glucose, Bld 112 (H) 65 - 99 mg/dL   BUN 58 (H) 6 - 20 mg/dL   Creatinine, Ser 1.51 (H) 0.61 - 1.24 mg/dL   Calcium 7.7 (L) 8.9 - 10.3 mg/dL   GFR calc non Af Amer 50 (L) >60 mL/min   GFR calc Af Amer 58 (L) >60 mL/min   Anion gap 6 5 - 15  Glucose, capillary      Status: Abnormal   Collection Time: 02/25/17  7:58 AM  Result Value Ref Range   Glucose-Capillary 110 (H) 65 - 99 mg/dL    Assessment & Plan: Present on Admission: . Multiple fractures of ribs, bilateral, initial encounter for closed fracture . Multiple closed anterior-posterior compression fractures of pelvis (HCC)    LOS: 18 days   Additional comments:I reviewed the patient's new clinical lab  test results. 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 Marcelino Scot 8/21 , S/P symphysis plating by Dr. Marcelino Scot 8/27,  - NWB BLE for 8 weeks, no ROM restrictions  Vent dependent acute hypoxic resp failure- PS this morning, PEEP 5, weaning as tolerated, continue Klon/Sero. Trial of extubation soon vs Trach next week FEN- hyperkalemia - improved/stable s/p kayexalate/lasix/Ca gluc. Decrease free water. IDDM- lantus and SSI ID- observe off ABX Renal - AKI improving VTE- PAS, Lovenox Elevated LFTs- direct hyperbilirubinemia from resorbtion of hematomas - ammonia now WNL, bili 7.3, recheck tomorrow Dispo- ICU Critical Care Total Time*: [facility]  02/25/2017  *Care during the described time interval was provided by me. I have reviewed this patient's available data, including medical history, events of note, physical examination and test results as part of my evaluation.  Patient ID: Peter [name], male   DOB: Sep 16, 1960, 56 y.o.   MRN: [id number]

## 2017-02-26 ENCOUNTER — Encounter (HOSPITAL_COMMUNITY): Payer: Self-pay | Admitting: *Deleted

## 2017-02-26 LAB — GLUCOSE, CAPILLARY
GLUCOSE-CAPILLARY: 137 mg/dL — AB (ref 65–99)
GLUCOSE-CAPILLARY: 147 mg/dL — AB (ref 65–99)
GLUCOSE-CAPILLARY: 164 mg/dL — AB (ref 65–99)
GLUCOSE-CAPILLARY: 188 mg/dL — AB (ref 65–99)
Glucose-Capillary: 144 mg/dL — ABNORMAL HIGH (ref 65–99)
Glucose-Capillary: 158 mg/dL — ABNORMAL HIGH (ref 65–99)

## 2017-02-26 LAB — BASIC METABOLIC PANEL
Anion gap: 9 (ref 5–15)
Anion gap: 6 (ref 5–15)
BUN: 55 mg/dL — ABNORMAL HIGH (ref 6–20)
BUN: 53 mg/dL — AB (ref 6–20)
CHLORIDE: 114 mmol/L — AB (ref 101–111)
CHLORIDE: 112 mmol/L — AB (ref 101–111)
CO2: 22 mmol/L (ref 22–32)
CO2: 24 mmol/L (ref 22–32)
CREATININE: 1.45 mg/dL — AB (ref 0.61–1.24)
Calcium: 7.6 mg/dL — ABNORMAL LOW (ref 8.9–10.3)
Calcium: 7.6 mg/dL — ABNORMAL LOW (ref 8.9–10.3)
Creatinine, Ser: 1.55 mg/dL — ABNORMAL HIGH (ref 0.61–1.24)
GFR calc Af Amer: 56 mL/min — ABNORMAL LOW (ref >60)
GFR calc Af Amer: >60 mL/min (ref >60)
GFR calc non Af Amer: 48 mL/min — ABNORMAL LOW (ref >60)
GFR calc non Af Amer: 52 mL/min — ABNORMAL LOW (ref >60)
GLUCOSE: 156 mg/dL — AB (ref 65–99)
Glucose, Bld: 183 mg/dL — ABNORMAL HIGH (ref 65–99)
POTASSIUM: 5.4 mmol/L — AB (ref 3.5–5.1)
Potassium: 5.8 mmol/L — ABNORMAL HIGH (ref 3.5–5.1)
SODIUM: 142 mmol/L (ref 135–145)
Sodium: 145 mmol/L (ref 135–145)

## 2017-02-26 LAB — HEPATIC FUNCTION PANEL
ALBUMIN: 1.4 g/dL — AB (ref 3.5–5.0)
ALK PHOS: 755 U/L — AB (ref 38–126)
ALT: 50 U/L (ref 17–63)
AST: 52 U/L — AB (ref 15–41)
BILIRUBIN TOTAL: 6.2 mg/dL — AB (ref 0.3–1.2)
Bilirubin, Direct: 3.8 mg/dL — ABNORMAL HIGH (ref 0.1–0.5)
Indirect Bilirubin: 2.4 mg/dL — ABNORMAL HIGH (ref 0.3–0.9)
Total Protein: 5.5 g/dL — ABNORMAL LOW (ref 6.5–8.1)

## 2017-02-26 LAB — CBC
HCT: 20.1 % — ABNORMAL LOW (ref 39.0–52.0)
Hemoglobin: 6.6 g/dL — CL (ref 13.0–17.0)
MCH: 29.6 pg (ref 26.0–34.0)
MCHC: 32.8 g/dL (ref 30.0–36.0)
MCV: 90.1 fL (ref 78.0–100.0)
PLATELETS: 383 10*3/uL (ref 150–400)
RBC: 2.23 MIL/uL — ABNORMAL LOW (ref 4.22–5.81)
RDW: 15 % (ref 11.5–15.5)
WBC: 7.8 10*3/uL (ref 4.0–10.5)

## 2017-02-26 LAB — HEMOGLOBIN AND HEMATOCRIT, BLOOD
HCT: 24.3 % — ABNORMAL LOW (ref 39.0–52.0)
Hemoglobin: 7.8 g/dL — ABNORMAL LOW (ref 13.0–17.0)

## 2017-02-26 LAB — MAGNESIUM: MAGNESIUM: 2.7 mg/dL — AB (ref 1.7–2.4)

## 2017-02-26 LAB — PREPARE RBC (CROSSMATCH)

## 2017-02-26 MED ORDER — SODIUM CHLORIDE 0.9 % IV SOLN
1.0000 g | Freq: Once | INTRAVENOUS | Status: AC
  Administered 2017-02-26: 1 g via INTRAVENOUS
  Filled 2017-02-26: qty 10

## 2017-02-26 MED ORDER — PATIROMER SORBITEX CALCIUM 8.4 G PO PACK
8.4000 g | PACK | Freq: Once | ORAL | Status: AC
  Administered 2017-02-26: 8.4 g via ORAL
  Filled 2017-02-26: qty 4

## 2017-02-26 MED ORDER — INSULIN ASPART 100 UNIT/ML IV SOLN
10.0000 [IU] | Freq: Once | INTRAVENOUS | Status: AC
  Administered 2017-02-26: 10 [IU] via INTRAVENOUS

## 2017-02-26 MED ORDER — SODIUM CHLORIDE 0.9 % IV SOLN: Freq: Once | INTRAVENOUS | Status: DC

## 2017-02-26 MED ORDER — INSULIN REGULAR BOLUS VIA INFUSION
10.0000 [IU] | Freq: Once | INTRAVENOUS | Status: DC
  Filled 2017-02-26: qty 10

## 2017-02-26 MED ORDER — SODIUM POLYSTYRENE SULFONATE 15 GM/60ML PO SUSP
15.0000 g | Freq: Once | ORAL | Status: AC
  Administered 2017-02-26: 15 g
  Filled 2017-02-26: qty 60

## 2017-02-26 MED ORDER — BACITRACIN ZINC 500 UNIT/GM EX OINT
TOPICAL_OINTMENT | Freq: Two times a day (BID) | CUTANEOUS | Status: DC
  Administered 2017-02-26: 31.5556 via TOPICAL
  Administered 2017-02-26: 1 via TOPICAL
  Administered 2017-02-27 – 2017-03-02 (×8): 31.5556 via TOPICAL
  Administered 2017-03-03: 1 via TOPICAL
  Administered 2017-03-03: 31.5556 via TOPICAL
  Administered 2017-03-04: 23:00:00 via TOPICAL
  Administered 2017-03-04 – 2017-03-10 (×12): 31.5556 via TOPICAL
  Administered 2017-03-10: 1 via TOPICAL
  Administered 2017-03-11: 31.5556 via TOPICAL
  Administered 2017-03-11: 10:00:00 via TOPICAL
  Administered 2017-03-12 – 2017-03-13 (×3): 31.5556 via TOPICAL
  Administered 2017-03-14: 1 via TOPICAL
  Administered 2017-03-14 – 2017-03-17 (×7): 31.5556 via TOPICAL
  Administered 2017-03-17: 10:00:00 via TOPICAL
  Administered 2017-03-18 – 2017-03-21 (×7): 31.5556 via TOPICAL
  Administered 2017-03-21: 1 via TOPICAL
  Administered 2017-03-22 (×2): 31.5556 via TOPICAL
  Administered 2017-03-23: 1 via TOPICAL
  Filled 2017-02-26: qty 28.35

## 2017-02-26 MED ORDER — SODIUM POLYSTYRENE SULFONATE 15 GM/60ML PO SUSP: 15.0000 g | Freq: Once | ORAL | Status: DC

## 2017-02-26 MED ORDER — DEXTROSE 10 % IV BOLUS
50.0000 mL | Freq: Once | INTRAVENOUS | Status: AC
  Administered 2017-02-26: 50 mL via INTRAVENOUS
  Filled 2017-02-26: qty 500

## 2017-02-26 NOTE — Progress Notes (Signed)
13 Days Post-Op   Subjective/Chief Complaint: Opens eyes on vent   Objective: Vital signs in last 24 hours: Temp:  [97 F (36.1 C)-100.9 F (38.3 C)] 97 F (36.1 C) (09/09 1100) Pulse Rate:  [45-112] 45 (09/09 1100) Resp:  [18-35] 23 (09/09 1100) BP: (98-144)/(49-75) 137/75 (09/09 1100) SpO2:  [97 %-100 %] 99 % (09/09 1100) FiO2 (%):  [30 %] 30 % (09/09 1100) Weight:  [95 kg (209 lb 7 oz)] 95 kg (209 lb 7 oz) (09/09 0422) Last BM Date: 02/26/17  Intake/Output from previous day: 09/08 0701 - 09/09 0700 In: 3702.5 [I.V.:1467.5; NG/GT:2025; IV Piggyback:210] Out: 3400 [Urine:3400] Intake/Output this shift: Total I/O In: 392.5 [I.V.:167.5; Blood:30; NG/GT:195] Out: 465 [Urine:465]  General: on vent Neuro: arouses and follow commans HEENT/Neck: ETT Resp: coarse bilateral breath sounds CVS: RRR GI: soft, NT +BS Extremities: edema 3+  Lab Results:   Recent Labs  02/25/17 0508 02/26/17 0500  WBC 10.1 7.8  HGB 7.0* 6.6*  HCT 21.4* 20.1*  PLT 454* 383   BMET  Recent Labs  02/25/17 0508 02/26/17 0500  NA 145 145  K 5.1 5.4*  CL 113* 114*  CO2 26 22  GLUCOSE 112* 183*  BUN 58* 55*  CREATININE 1.51* 1.55*  CALCIUM 7.7* 7.6*   PT/INR No results for input(s): LABPROT, INR in the last 72 hours. ABG No results for input(s): PHART, HCO3 in the last 72 hours.  Invalid input(s): PCO2, PO2  Studies/Results: Dg Chest Port 1 View  Result Date: 02/25/2017 CLINICAL DATA:  Respiratory failure EXAM: PORTABLE CHEST 1 VIEW COMPARISON:  02/21/2017 FINDINGS: Endotracheal tube in good position. NG tube enters the stomach. Interval placement of right-sided central venous catheter with the tip in the mid right atrium. No pneumothorax Improved lung volume.  Improvement in bibasilar airspace disease. IMPRESSION: Interval placement of central venous catheter with the tip in the mid right atrium Improved lung volume with improvement in bibasilar airspace disease. Electronically  Signed   By: Franchot Gallo M.D.   On: 02/25/2017 07:20    Anti-infectives: Anti-infectives    Start     Dose/Rate Route Frequency Ordered Stop   02/19/17 2200  vancomycin (VANCOCIN) IVPB 750 mg/150 ml premix  Status:  Discontinued     750 mg 150 mL/hr over 60 Minutes Intravenous Every 12 hours 02/19/17 0807 02/20/17 1150   02/19/17 0900  vancomycin (VANCOCIN) 2,000 mg in sodium chloride 0.9 % 500 mL IVPB     2,000 mg 250 mL/hr over 120 Minutes Intravenous  Once 02/19/17 0807 02/19/17 1151   02/17/17 1100  cefTRIAXone (ROCEPHIN) 2 g in dextrose 5 % 50 mL IVPB  Status:  Discontinued     2 g 100 mL/hr over 30 Minutes Intravenous Every 24 hours 02/17/17 1041 02/19/17 0801   02/14/17 1500  piperacillin-tazobactam (ZOSYN) IVPB 3.375 g  Status:  Discontinued     3.375 g 12.5 mL/hr over 240 Minutes Intravenous Every 8 hours 02/14/17 1351 02/17/17 1041   02/11/17 1000  piperacillin-tazobactam (ZOSYN) IVPB 3.375 g  Status:  Discontinued     3.375 g 12.5 mL/hr over 240 Minutes Intravenous Every 8 hours 02/11/17 0858 02/14/17 1351   02/11/17 1000  vancomycin (VANCOCIN) IVPB 750 mg/150 ml premix  Status:  Discontinued     750 mg 150 mL/hr over 60 Minutes Intravenous Every 12 hours 02/11/17 0858 02/14/17 0844   02/07/17 2000  ceFAZolin (ANCEF) IVPB 1 g/50 mL premix     1 g 100 mL/hr over 30  Minutes Intravenous Every 8 hours 02/07/17 1416 02/08/17 1543      Assessment/Plan: [employer] ABL anemia -lower today, receiving one unit prbc R adrenal hemorrhage CV - freq PACs, continue lopressor Grade 2 R renal lac B rib FX with L HPTX - CT out 9/2, CXR OK 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 Marcelino Scot 8/21 , S/P symphysis plating by Dr. Marcelino Scot 8/27,  - NWB BLE for 8 weeks, no ROM restrictions  Vent dependent acute hypoxic resp failure-weaningas tolerated, continue Klon/Sero. Trial of extubation soon vs Trach next  week FEN- hyperkalemia, will recheck in am IDDM- lantus and SSI ID- observe off ABX Renal - AKI stable VTE- PAS, Lovenox Elevated LFTs- direct hyperbilirubinemia from resorbtion of hematomas - ammonia now WNL, bili 6.2 now Dispo- ICU  [place] 02/26/2017

## 2017-02-26 NOTE — Progress Notes (Signed)
CRITICAL VALUE ALERT  Critical Value:  6.6 Hgb  Date & Time Notied: 02/26/2017 0613H  Provider Notified: Elodia Florence Trauma MD  Orders Received/Actions taken: RN given orders to transfuse 1 unit blood.   Will continue to monitor.  Naomie Dean, RN

## 2017-02-27 LAB — GLUCOSE, CAPILLARY
GLUCOSE-CAPILLARY: 118 mg/dL — AB (ref 65–99)
GLUCOSE-CAPILLARY: 126 mg/dL — AB (ref 65–99)
GLUCOSE-CAPILLARY: 134 mg/dL — AB (ref 65–99)
GLUCOSE-CAPILLARY: 164 mg/dL — AB (ref 65–99)
GLUCOSE-CAPILLARY: 97 mg/dL (ref 65–99)
Glucose-Capillary: 151 mg/dL — ABNORMAL HIGH (ref 65–99)
Glucose-Capillary: 138 mg/dL — ABNORMAL HIGH (ref 65–99)

## 2017-02-27 LAB — BPAM RBC
Blood Product Expiration Date: 201810032359
ISSUE DATE / TIME: 201809091010
UNIT TYPE AND RH: 8400

## 2017-02-27 LAB — BASIC METABOLIC PANEL
ANION GAP: 6 (ref 5–15)
BUN: 50 mg/dL — ABNORMAL HIGH (ref 6–20)
CALCIUM: 7.9 mg/dL — AB (ref 8.9–10.3)
CO2: 25 mmol/L (ref 22–32)
Chloride: 113 mmol/L — ABNORMAL HIGH (ref 101–111)
Creatinine, Ser: 1.46 mg/dL — ABNORMAL HIGH (ref 0.61–1.24)
GFR calc non Af Amer: 52 mL/min — ABNORMAL LOW (ref >60)
Glucose, Bld: 137 mg/dL — ABNORMAL HIGH (ref 65–99)
POTASSIUM: 5 mmol/L (ref 3.5–5.1)
Sodium: 144 mmol/L (ref 135–145)

## 2017-02-27 LAB — CBC
HCT: 23.2 % — ABNORMAL LOW (ref 39.0–52.0)
HEMOGLOBIN: 7.6 g/dL — AB (ref 13.0–17.0)
MCH: 29.1 pg (ref 26.0–34.0)
MCHC: 32.8 g/dL (ref 30.0–36.0)
MCV: 88.9 fL (ref 78.0–100.0)
Platelets: 364 10*3/uL (ref 150–400)
RBC: 2.61 MIL/uL — AB (ref 4.22–5.81)
RDW: 15.5 % (ref 11.5–15.5)
WBC: 9.1 10*3/uL (ref 4.0–10.5)

## 2017-02-27 LAB — TYPE AND SCREEN
ABO/RH(D): AB POS
Antibody Screen: NEGATIVE
Unit division: 0

## 2017-02-27 NOTE — Plan of Care (Signed)
Problem: Respiratory: Goal: Ability to maintain a clear airway and adequate ventilation will improve Outcome: Progressing Weaned all day, possible extubation 9/11 vs trach 9/12.
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
## 2017-02-27 NOTE — Progress Notes (Signed)
Follow up - Trauma Critical Care  Patient Details:    Peter [name] is an 56 y.o. male.  Lines/tubes : Airway 8 mm (Active)  Secured at (cm) 23 cm 02/27/2017  8:11 AM  Measured From Lips 02/27/2017  8:11 AM  [place] 02/27/2017  8:11 AM  Secured By [employer] 02/27/2017  8:11 AM  Tube Holder Repositioned Yes 02/27/2017  8:11 AM  Cuff Pressure (cm H2O) 28 cm H2O 02/27/2017  8:11 AM  Site Condition Dry 02/27/2017  8:11 AM     PICC Double Lumen 02/23/17 PICC Right Cephalic 42 cm 0 cm (Active)  Indication for Insertion or Continuance of Line Prolonged intravenous therapies 02/26/2017  7:39 PM  Exposed Catheter (cm) 0 cm 02/23/2017 11:00 AM  Site Assessment Clean;Intact;Dry 02/26/2017  7:39 PM  Lumen #1 Status In-line blood sampling system in place 02/26/2017  7:39 PM  Lumen #2 Status Infusing 02/26/2017  7:39 PM  Dressing Type Transparent;Occlusive;Securing device 02/26/2017  7:39 PM  Dressing Status Clean;Dry;Intact;Antimicrobial disc in place 02/26/2017  7:39 PM  Line Care Connections checked and tightened 02/26/2017  7:39 PM  Dressing Intervention Other (Comment) 02/26/2017  7:39 PM  Dressing Change Due 03/02/17 02/26/2017  7:39 PM     NG/OG Tube Orogastric 16 Fr. Right mouth Xray (Active)  Cm Marking at Nare/Corner of Mouth (if applicable) 61 cm 09/21/100  8:00 PM  External Length of Tube (cm) - (if applicable) 65 cm 12/19/5364  8:00 PM  Site Assessment Clean;Dry;Intact 02/26/2017  8:00 PM  Ongoing Placement Verification No change in cm markings or external length of tube from initial placement;No change in respiratory status;No acute changes, not attributed to clinical condition 02/26/2017  8:00 PM  Status Infusing tube feed 02/26/2017  8:00 PM  Drainage Appearance Yellow 02/24/2017  8:30 PM  Intake (mL) 120 mL 02/25/2017  1:00 AM  Output (mL) 100 mL 02/24/2017  6:00 PM     Rectal Tube/Pouch (Active)     Urethral Catheter [name], EMT Double-lumen 16 Fr. (Active)  Indication for  Insertion or Continuance of Catheter Bladder outlet obstruction / other urologic reason 02/26/2017  8:00 PM  Site Assessment Edema;Swelling;Clean;Intact 02/26/2017  8:00 PM  Catheter Maintenance Bag below level of bladder;Catheter secured;Drainage bag/tubing not touching floor;Insertion date on drainage bag;No dependent loops;Seal intact 02/26/2017  8:00 PM  Collection Container Standard drainage bag 02/26/2017  8:00 PM  Securement Method Securing device (Describe) 02/26/2017  8:00 PM  Urinary Catheter Interventions Unclamped 02/26/2017  8:00 PM  Input (mL) 600 mL 02/21/2017  6:00 PM  Output (mL) 150 mL 02/27/2017  6:00 AM    Microbiology/Sepsis markers: Results for orders placed or performed during the hospital encounter of 02/07/17  MRSA PCR Screening     Status: None   Collection Time: 02/08/17  4:08 AM  Result Value Ref Range Status   MRSA by PCR NEGATIVE NEGATIVE Final    Comment:        The GeneXpert MRSA Assay (FDA approved for NASAL specimens only), is one component of a comprehensive MRSA colonization surveillance program. It is not intended to diagnose MRSA infection nor to guide or monitor treatment for MRSA infections.   Culture, respiratory (NON-Expectorated)     Status: None   Collection Time: 02/10/17 10:59 AM  Result Value Ref Range Status   Specimen Description TRACHEAL ASPIRATE  Final   Special Requests Normal  Final   Gram Stain   Final    ABUNDANT WBC PRESENT, PREDOMINANTLY PMN RARE  SQUAMOUS EPITHELIAL CELLS PRESENT ABUNDANT GRAM NEGATIVE COCCI IN PAIRS ABUNDANT GRAM NEGATIVE RODS MODERATE GRAM POSITIVE COCCI IN CHAINS    Culture ABUNDANT Consistent with normal respiratory flora.  Final   Report Status 02/12/2017 FINAL  Final  Culture, blood (Routine X 2) w Reflex to ID Panel     Status: Abnormal   Collection Time: 02/11/17  3:25 PM  Result Value Ref Range Status   Specimen Description BLOOD RIGHT HAND  Final   Special Requests   Final    BOTTLES DRAWN AEROBIC ONLY  Blood Culture adequate volume   Culture  Setup Time   Final    GRAM NEGATIVE RODS AEROBIC BOTTLE ONLY CRITICAL VALUE NOTED.  VALUE IS CONSISTENT WITH PREVIOUSLY REPORTED AND CALLED VALUE.    Culture (A)  Final    KLEBSIELLA OXYTOCA SUSCEPTIBILITIES PERFORMED ON PREVIOUS CULTURE WITHIN THE LAST 5 DAYS.    Report Status 02/17/2017 FINAL  Final  Culture, blood (Routine X 2) w Reflex to ID Panel     Status: Abnormal   Collection Time: 02/11/17  3:25 PM  Result Value Ref Range Status   Specimen Description BLOOD RIGHT HAND  Final   Special Requests   Final    BOTTLES DRAWN AEROBIC ONLY Blood Culture adequate volume   Culture  Setup Time   Final    GRAM NEGATIVE RODS AEROBIC BOTTLE ONLY CRITICAL RESULT CALLED TO, READ BACK BY AND VERIFIED WITH: J.LEDFORD, PHARMD 02/15/17 0632 L.CHAMPION    Culture KLEBSIELLA OXYTOCA (A)  Final   Report Status 02/17/2017 FINAL  Final   Organism ID, Bacteria KLEBSIELLA OXYTOCA  Final      Susceptibility   Klebsiella oxytoca - MIC*    AMPICILLIN >=32 RESISTANT Resistant     CEFAZOLIN >=64 RESISTANT Resistant     CEFEPIME <=1 SENSITIVE Sensitive     CEFTAZIDIME <=1 SENSITIVE Sensitive     CEFTRIAXONE <=1 SENSITIVE Sensitive     CIPROFLOXACIN <=0.25 SENSITIVE Sensitive     GENTAMICIN <=1 SENSITIVE Sensitive     IMIPENEM <=0.25 SENSITIVE Sensitive     TRIMETH/SULFA <=20 SENSITIVE Sensitive     AMPICILLIN/SULBACTAM 8 SENSITIVE Sensitive     PIP/TAZO <=4 SENSITIVE Sensitive     Extended ESBL NEGATIVE Sensitive     * KLEBSIELLA OXYTOCA  Blood Culture ID Panel (Reflexed)     Status: Abnormal   Collection Time: 02/11/17  3:25 PM  Result Value Ref Range Status   Enterococcus species NOT DETECTED NOT DETECTED Final   Listeria monocytogenes NOT DETECTED NOT DETECTED Final   Staphylococcus species NOT DETECTED NOT DETECTED Final   Staphylococcus aureus NOT DETECTED NOT DETECTED Final   Streptococcus species NOT DETECTED NOT DETECTED Final   Streptococcus  agalactiae NOT DETECTED NOT DETECTED Final   Streptococcus pneumoniae NOT DETECTED NOT DETECTED Final   Streptococcus pyogenes NOT DETECTED NOT DETECTED Final   Acinetobacter baumannii NOT DETECTED NOT DETECTED Final   Enterobacteriaceae species DETECTED (A) NOT DETECTED Final    Comment: Enterobacteriaceae represent a large family of gram-negative bacteria, not a single organism. CRITICAL RESULT CALLED TO, READ BACK BY AND VERIFIED WITH: J.LEDFORD, PHARMD 02/15/17 0632 L.CHAMPION    Enterobacter cloacae complex NOT DETECTED NOT DETECTED Final   Escherichia coli NOT DETECTED NOT DETECTED Final   Klebsiella oxytoca DETECTED (A) NOT DETECTED Final    Comment: CRITICAL RESULT CALLED TO, READ BACK BY AND VERIFIED WITH: J.LEDFORD, PHARMD 02/15/17 0632 L.CHAMPION    Klebsiella pneumoniae NOT DETECTED NOT DETECTED Final  Proteus species NOT DETECTED NOT DETECTED Final   Serratia marcescens NOT DETECTED NOT DETECTED Final   Carbapenem resistance NOT DETECTED NOT DETECTED Final   Haemophilus influenzae NOT DETECTED NOT DETECTED Final   Neisseria meningitidis NOT DETECTED NOT DETECTED Final   Pseudomonas aeruginosa NOT DETECTED NOT DETECTED Final   Candida albicans NOT DETECTED NOT DETECTED Final   Candida glabrata NOT DETECTED NOT DETECTED Final   Candida krusei NOT DETECTED NOT DETECTED Final   Candida parapsilosis NOT DETECTED NOT DETECTED Final   Candida tropicalis NOT DETECTED NOT DETECTED Final  Surgical PCR screen     Status: None   Collection Time: 02/13/17  6:30 AM  Result Value Ref Range Status   MRSA, PCR NEGATIVE NEGATIVE Final   Staphylococcus aureus NEGATIVE NEGATIVE Final    Comment:        The Xpert SA Assay (FDA approved for NASAL specimens in patients over 64 years of age), is one component of a comprehensive surveillance program.  Test performance has been validated by [facility] for patients greater than or equal to 38 year old. It is not intended to diagnose  infection nor to guide or monitor treatment.   Culture, blood (Routine X 2) w Reflex to ID Panel     Status: None   Collection Time: 02/18/17  2:52 PM  Result Value Ref Range Status   Specimen Description BLOOD RIGHT HAND  Final   Special Requests IN PEDIATRIC BOTTLE Blood Culture adequate volume  Final   Culture NO GROWTH 5 DAYS  Final   Report Status 02/23/2017 FINAL  Final  Culture, blood (Routine X 2) w Reflex to ID Panel     Status: Abnormal   Collection Time: 02/18/17  2:55 PM  Result Value Ref Range Status   Specimen Description BLOOD RIGHT WRIST  Final   Special Requests IN PEDIATRIC BOTTLE Blood Culture adequate volume  Final   Culture  Setup Time   Final    IN PEDIATRIC BOTTLE GRAM POSITIVE RODS CRITICAL RESULT CALLED TO, READ BACK BY AND VERIFIED WITH: L FOLTANSKI,PHARMD AT 0730 02/19/17 BY L BENFIELD    Culture (A)  Final    BACILLUS SPECIES Standardized susceptibility testing for this organism is not available. ENTEROCOCCUS DETECTED ON BCID, NOT RECOVERED IN CULTURE    Report Status 02/21/2017 FINAL  Final  Blood Culture ID Panel (Reflexed)     Status: Abnormal   Collection Time: 02/18/17  2:55 PM  Result Value Ref Range Status   Enterococcus species DETECTED (A) NOT DETECTED Final    Comment: CRITICAL RESULT CALLED TO, READ BACK BY AND VERIFIED WITH: L FOLTANSKI,PHARMD AT 0730 02/19/17 BY L BENFIELD    Vancomycin resistance NOT DETECTED NOT DETECTED Final   Listeria monocytogenes NOT DETECTED NOT DETECTED Final   Staphylococcus species NOT DETECTED NOT DETECTED Final   Staphylococcus aureus NOT DETECTED NOT DETECTED Final   Streptococcus species NOT DETECTED NOT DETECTED Final   Streptococcus agalactiae NOT DETECTED NOT DETECTED Final   Streptococcus pneumoniae NOT DETECTED NOT DETECTED Final   Streptococcus pyogenes NOT DETECTED NOT DETECTED Final   Acinetobacter baumannii NOT DETECTED NOT DETECTED Final   Enterobacteriaceae species NOT DETECTED NOT DETECTED Final    Enterobacter cloacae complex NOT DETECTED NOT DETECTED Final   Escherichia coli NOT DETECTED NOT DETECTED Final   Klebsiella oxytoca NOT DETECTED NOT DETECTED Final   Klebsiella pneumoniae NOT DETECTED NOT DETECTED Final   Proteus species NOT DETECTED NOT DETECTED Final   Serratia marcescens  NOT DETECTED NOT DETECTED Final   Haemophilus influenzae NOT DETECTED NOT DETECTED Final   Neisseria meningitidis NOT DETECTED NOT DETECTED Final   Pseudomonas aeruginosa NOT DETECTED NOT DETECTED Final   Candida albicans NOT DETECTED NOT DETECTED Final   Candida glabrata NOT DETECTED NOT DETECTED Final   Candida krusei NOT DETECTED NOT DETECTED Final   Candida parapsilosis NOT DETECTED NOT DETECTED Final   Candida tropicalis NOT DETECTED NOT DETECTED Final  Culture, blood (routine x 2)     Status: None   Collection Time: 02/20/17  4:35 AM  Result Value Ref Range Status   Specimen Description BLOOD RIGHT HAND  Final   Special Requests   Final    BOTTLES DRAWN AEROBIC AND ANAEROBIC Blood Culture adequate volume   Culture NO GROWTH 5 DAYS  Final   Report Status 02/25/2017 FINAL  Final  Culture, blood (routine x 2)     Status: None   Collection Time: 02/20/17  4:35 AM  Result Value Ref Range Status   Specimen Description BLOOD RIGHT HAND  Final   Special Requests   Final    BOTTLES DRAWN AEROBIC AND ANAEROBIC Blood Culture adequate volume   Culture NO GROWTH 5 DAYS  Final   Report Status 02/25/2017 FINAL  Final    Anti-infectives:  Anti-infectives    Start     Dose/Rate Route Frequency Ordered Stop   02/19/17 2200  vancomycin (VANCOCIN) IVPB 750 mg/150 ml premix  Status:  Discontinued     750 mg 150 mL/hr over 60 Minutes Intravenous Every 12 hours 02/19/17 0807 02/20/17 1150   02/19/17 0900  vancomycin (VANCOCIN) 2,000 mg in sodium chloride 0.9 % 500 mL IVPB     2,000 mg 250 mL/hr over 120 Minutes Intravenous  Once 02/19/17 0807 02/19/17 1151   02/17/17 1100  cefTRIAXone (ROCEPHIN) 2 g in  dextrose 5 % 50 mL IVPB  Status:  Discontinued     2 g 100 mL/hr over 30 Minutes Intravenous Every 24 hours 02/17/17 1041 02/19/17 0801   02/14/17 1500  piperacillin-tazobactam (ZOSYN) IVPB 3.375 g  Status:  Discontinued     3.375 g 12.5 mL/hr over 240 Minutes Intravenous Every 8 hours 02/14/17 1351 02/17/17 1041   02/11/17 1000  piperacillin-tazobactam (ZOSYN) IVPB 3.375 g  Status:  Discontinued     3.375 g 12.5 mL/hr over 240 Minutes Intravenous Every 8 hours 02/11/17 0858 02/14/17 1351   02/11/17 1000  vancomycin (VANCOCIN) IVPB 750 mg/150 ml premix  Status:  Discontinued     750 mg 150 mL/hr over 60 Minutes Intravenous Every 12 hours 02/11/17 0858 02/14/17 0844   02/07/17 2000  ceFAZolin (ANCEF) IVPB 1 g/50 mL premix     1 g 100 mL/hr over 30 Minutes Intravenous Every 8 hours 02/07/17 1416 02/08/17 1543      Best Practice/Protocols:  VTE Prophylaxis: Lovenox (prophylaxtic dose) and Mechanical GI Prophylaxis: Proton Pump Inhibitor Hyperglycemia (ICU)  Consults: Treatment Team:  Altamese Runnemede, MD     Subjective:    Overnight Issues: NAE   Objective:  Vital signs for last 24 hours: Temp:  [96.8 F (36 C)-99.9 F (37.7 C)] 97.9 F (36.6 C) (09/10 1000) Pulse Rate:  [39-98] 48 (09/10 1000) Resp:  [18-32] 25 (09/10 1000) BP: (105-144)/(62-90) 133/76 (09/10 1000) SpO2:  [98 %-100 %] 98 % (09/10 1031) FiO2 (%):  [30 %] 30 % (09/10 0811) Weight:  [99.6 kg (219 lb 9.3 oz)] 99.6 kg (219 lb 9.3 oz) (09/10 0400)  Hemodynamic  parameters for last 24 hours:  transfused 1 prbc yesterday for hgb <7.0, hgb is 7.6 this AM  Intake/Output from previous day: 09/09 0701 - 09/10 0700 In: 2859.2 [I.V.:1267.5; Blood:371.7; NG/GT:1010; IV [id number]:315] Out: 3085 [Urine:3085]  Intake/Output this shift: No intake/output data recorded.  Vent settings for last 24 hours: Vent Mode: CPAP;PSV FiO2 (%):  [30 %] 30 % Set Rate:  [20 bmp] 20 bmp Vt Set:  [580 mL] 580 mL PEEP:  [5 cmH20]  5 cmH20 Pressure Support:  [10 cmH20] 10 cmH20 Plateau Pressure:  [22 VVO16-07 cmH20] 22 cmH20  Physical Exam:  General: alert and no respiratory distress Neuro: alert and follows commands HEENT/Neck: ETT Resp: clear to auscultation bilaterally CVS: regular rate and rhythm, S1, S2 normal, no murmur, click, rub or gallop GI: soft, nontender, BS WNL, no r/g Skin: no rash Extremities: mild edema in bilateral lower extremities, DP pulses 2+ bilaterally  Results for orders placed or performed during the hospital encounter of 02/07/17 (from the past 24 hour(s))  Glucose, capillary     Status: Abnormal   Collection Time: 02/26/17 12:51 PM  Result Value Ref Range   Glucose-Capillary 144 (H) 65 - 99 mg/dL  Glucose, capillary     Status: Abnormal   Collection Time: 02/26/17  4:23 PM  Result Value Ref Range   Glucose-Capillary 158 (H) 65 - 99 mg/dL  Basic metabolic panel     Status: Abnormal   Collection Time: 02/26/17  6:09 PM  Result Value Ref Range   Sodium 142 135 - 145 mmol/L   Potassium 5.8 (H) 3.5 - 5.1 mmol/L   Chloride 112 (H) 101 - 111 mmol/L   CO2 24 22 - 32 mmol/L   Glucose, Bld 156 (H) 65 - 99 mg/dL   BUN 53 (H) 6 - 20 mg/dL   Creatinine, Ser 1.45 (H) 0.61 - 1.24 mg/dL   Calcium 7.6 (L) 8.9 - 10.3 mg/dL   GFR calc non Af Amer 52 (L) >60 mL/min   GFR calc Af Amer >60 >60 mL/min   Anion gap 6 5 - 15  Magnesium     Status: Abnormal   Collection Time: 02/26/17  6:09 PM  Result Value Ref Range   Magnesium 2.7 (H) 1.7 - 2.4 mg/dL  Hemoglobin and hematocrit, blood     Status: Abnormal   Collection Time: 02/26/17  6:09 PM  Result Value Ref Range   Hemoglobin 7.8 (L) 13.0 - 17.0 g/dL   HCT 24.3 (L) 39.0 - 52.0 %  Glucose, capillary     Status: Abnormal   Collection Time: 02/26/17  8:33 PM  Result Value Ref Range   Glucose-Capillary 147 (H) 65 - 99 mg/dL  Glucose, capillary     Status: None   Collection Time: 02/27/17 12:24 AM  Result Value Ref Range   Glucose-Capillary  97 65 - 99 mg/dL  Glucose, capillary     Status: Abnormal   Collection Time: 02/27/17  4:13 AM  Result Value Ref Range   Glucose-Capillary 126 (H) 65 - 99 mg/dL  Basic metabolic panel     Status: Abnormal   Collection Time: 02/27/17  4:26 AM  Result Value Ref Range   Sodium 144 135 - 145 mmol/L   Potassium 5.0 3.5 - 5.1 mmol/L   Chloride 113 (H) 101 - 111 mmol/L   CO2 25 22 - 32 mmol/L   Glucose, Bld 137 (H) 65 - 99 mg/dL   BUN 50 (H) 6 - 20 mg/dL   Creatinine,  Ser 1.46 (H) 0.61 - 1.24 mg/dL   Calcium 7.9 (L) 8.9 - 10.3 mg/dL   GFR calc non Af Amer 52 (L) >60 mL/min   GFR calc Af Amer >60 >60 mL/min   Anion gap 6 5 - 15  CBC     Status: Abnormal   Collection Time: 02/27/17  4:26 AM  Result Value Ref Range   WBC 9.1 4.0 - 10.5 K/uL   RBC 2.61 (L) 4.22 - 5.81 MIL/uL   Hemoglobin 7.6 (L) 13.0 - 17.0 g/dL   HCT 23.2 (L) 39.0 - 52.0 %   MCV 88.9 78.0 - 100.0 fL   MCH 29.1 26.0 - 34.0 pg   MCHC 32.8 30.0 - 36.0 g/dL   RDW 15.5 11.5 - 15.5 %   Platelets 364 150 - 400 K/uL  Glucose, capillary     Status: Abnormal   Collection Time: 02/27/17  9:06 AM  Result Value Ref Range   Glucose-Capillary 134 (H) 65 - 99 mg/dL   Comment 1 Notify RN    Comment 2 Document in Chart     Assessment & Plan: [employer] ABL anemia -transfused 1u PRBC yesterday, hgb 7.6 this AM - continue to monitor R adrenal hemorrhage CV- freq PACs, continue lopressor Grade 2 R renal lac B rib FX with L HPTX - CT out 9/2, CXR OK 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 Marcelino Scot 8/21 , S/P symphysis plating by Dr. Marcelino Scot 8/27,  - NWB BLE for 8 weeks, no ROM restrictions  Vent dependent acute hypoxic resp failure-weaningas tolerated, continue Klon/Sero. Trial of extubation soon vs Trach this week FEN- hyperkalemia, 5.0 today will continue to monitor IDDM- lantus and SSI ID- observe off ABX, WBC 9.1 today Renal - AKI stable VTE- PAS,  Lovenox Elevated LFTs- direct hyperbilirubinemia from resorbtion of hematomas - ammonia now WNL, bili 6.2 9/9 - can recheck LFTs tomorrow AM Dispo- ICU  LOS: 20 days   Additional comments:I have reviewed patient's new lab results and will discuss with MD  Critical Care Total Time*: [place] , [facility] Surgery 02/27/2017, 10:45 AM Pager: [phone] Mon-Fri 7:00 am-4:30 pm Sat-Sun 7:00 am-11:30 am  02/27/2017  *Care during the described time interval was provided by me. I have reviewed this patient's available data, including medical history, events of note, physical examination and test results as part of my evaluation.

## 2017-02-28 DIAGNOSIS — L8990 Pressure ulcer of unspecified site, unspecified stage: Secondary | ICD-10-CM | POA: Insufficient documentation

## 2017-02-28 LAB — CBC
HCT: 22.6 % — ABNORMAL LOW (ref 39.0–52.0)
Hemoglobin: 7.3 g/dL — ABNORMAL LOW (ref 13.0–17.0)
MCH: 29 pg (ref 26.0–34.0)
MCHC: 32.3 g/dL (ref 30.0–36.0)
MCV: 89.7 fL (ref 78.0–100.0)
Platelets: 370 10*3/uL (ref 150–400)
RBC: 2.52 MIL/uL — ABNORMAL LOW (ref 4.22–5.81)
RDW: 15.2 % (ref 11.5–15.5)
WBC: 7.7 10*3/uL (ref 4.0–10.5)

## 2017-02-28 LAB — COMPREHENSIVE METABOLIC PANEL
ALBUMIN: 1.5 g/dL — AB (ref 3.5–5.0)
ALK PHOS: 785 U/L — AB (ref 38–126)
ALT: 50 U/L (ref 17–63)
AST: 42 U/L — ABNORMAL HIGH (ref 15–41)
Anion gap: 4 — ABNORMAL LOW (ref 5–15)
BUN: 48 mg/dL — ABNORMAL HIGH (ref 6–20)
CALCIUM: 7.7 mg/dL — AB (ref 8.9–10.3)
CHLORIDE: 112 mmol/L — AB (ref 101–111)
CO2: 25 mmol/L (ref 22–32)
CREATININE: 1.46 mg/dL — AB (ref 0.61–1.24)
GFR calc non Af Amer: 52 mL/min — ABNORMAL LOW (ref >60)
GLUCOSE: 151 mg/dL — AB (ref 65–99)
Potassium: 4.8 mmol/L (ref 3.5–5.1)
SODIUM: 141 mmol/L (ref 135–145)
Total Bilirubin: 4.8 mg/dL — ABNORMAL HIGH (ref 0.3–1.2)
Total Protein: 5.7 g/dL — ABNORMAL LOW (ref 6.5–8.1)

## 2017-02-28 LAB — GLUCOSE, CAPILLARY
GLUCOSE-CAPILLARY: 143 mg/dL — AB (ref 65–99)
GLUCOSE-CAPILLARY: 149 mg/dL — AB (ref 65–99)
Glucose-Capillary: 153 mg/dL — ABNORMAL HIGH (ref 65–99)
Glucose-Capillary: 144 mg/dL — ABNORMAL HIGH (ref 65–99)
Glucose-Capillary: 142 mg/dL — ABNORMAL HIGH (ref 65–99)
Glucose-Capillary: 150 mg/dL — ABNORMAL HIGH (ref 65–99)
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
## 2017-02-28 NOTE — Consult Note (Addendum)
[place] Nurse wound consult note Reason for Consult: Consult requested for right heel wound; pt had surgery which lasted several hours on 9/5 and pressure injury was noted on 9/10 Wound type: Dark purple deep tissue injury  Pressure Injury POA: No Measurement: 2X3cm Dressing procedure/placement/frequency: Heels are already floated in heel lift boots to reduce pressure.  Family at the bedside to assess location and discuss plan of care and how deep tissue injuries may evolve into full thickness tissue loss. Please re-consult if further assistance is needed.  Thank-you,  Julien Girt MSN, [place], [place], [place], [place]
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
## 2017-03-01 ENCOUNTER — Inpatient Hospital Stay (HOSPITAL_COMMUNITY): Payer: BLUE CROSS/BLUE SHIELD

## 2017-03-01 ENCOUNTER — Encounter (HOSPITAL_COMMUNITY): Payer: Self-pay | Admitting: Certified Registered Nurse Anesthetist

## 2017-03-01 ENCOUNTER — Encounter (HOSPITAL_COMMUNITY)
Admission: EM | Disposition: A | Discharge status: Nursing Home (Includes ICF) | Payer: Self-pay | Source: Home / Self Care

## 2017-03-01 HISTORY — PX: ESOPHAGOGASTRODUODENOSCOPY: SHX5428

## 2017-03-01 HISTORY — PX: PEG PLACEMENT: SHX5437

## 2017-03-01 HISTORY — PX: PERCUTANEOUS TRACHEOSTOMY: SHX5288

## 2017-03-01 LAB — CBC
HEMATOCRIT: 21.3 % — AB (ref 39.0–52.0)
HEMOGLOBIN: 7 g/dL — AB (ref 13.0–17.0)
MCH: 29.3 pg (ref 26.0–34.0)
MCHC: 32.9 g/dL (ref 30.0–36.0)
MCV: 89.1 fL (ref 78.0–100.0)
Platelets: 368 10*3/uL (ref 150–400)
RBC: 2.39 MIL/uL — ABNORMAL LOW (ref 4.22–5.81)
RDW: 14.6 % (ref 11.5–15.5)
WBC: 9.6 10*3/uL (ref 4.0–10.5)

## 2017-03-01 LAB — GLUCOSE, CAPILLARY
GLUCOSE-CAPILLARY: 117 mg/dL — AB (ref 65–99)
GLUCOSE-CAPILLARY: 121 mg/dL — AB (ref 65–99)
GLUCOSE-CAPILLARY: 97 mg/dL (ref 65–99)
Glucose-Capillary: 103 mg/dL — ABNORMAL HIGH (ref 65–99)
Glucose-Capillary: 151 mg/dL — ABNORMAL HIGH (ref 65–99)
Glucose-Capillary: 181 mg/dL — ABNORMAL HIGH (ref 65–99)

## 2017-03-01 LAB — BASIC METABOLIC PANEL
ANION GAP: 3 — AB (ref 5–15)
BUN: 45 mg/dL — ABNORMAL HIGH (ref 6–20)
CALCIUM: 7.3 mg/dL — AB (ref 8.9–10.3)
CHLORIDE: 112 mmol/L — AB (ref 101–111)
CO2: 23 mmol/L (ref 22–32)
CREATININE: 1.39 mg/dL — AB (ref 0.61–1.24)
GFR calc non Af Amer: 55 mL/min — ABNORMAL LOW (ref >60)
Glucose, Bld: 122 mg/dL — ABNORMAL HIGH (ref 65–99)
Potassium: 4.6 mmol/L (ref 3.5–5.1)
Sodium: 138 mmol/L (ref 135–145)

## 2017-03-01 SURGERY — EGD (ESOPHAGOGASTRODUODENOSCOPY)
Anesthesia: Moderate Sedation

## 2017-03-01 SURGERY — CREATION, TRACHEOSTOMY, PERCUTANEOUS
Anesthesia: General

## 2017-03-01 MED ORDER — VECURONIUM BROMIDE 10 MG IV SOLR
INTRAVENOUS | Status: AC
  Administered 2017-03-01: 10 mg
  Filled 2017-03-01: qty 10

## 2017-03-01 MED ORDER — LIDOCAINE-EPINEPHRINE (PF) 1.5 %-1:200000 IJ SOLN
INTRAMUSCULAR | Status: DC | PRN
  Administered 2017-03-01: 5 mL

## 2017-03-01 MED ORDER — MIDAZOLAM HCL 2 MG/2ML IJ SOLN
INTRAMUSCULAR | Status: AC
  Administered 2017-03-01: 2 mg
  Filled 2017-03-01: qty 8

## 2017-03-01 MED ORDER — SODIUM CHLORIDE 0.9 % IJ SOLN
INTRAMUSCULAR | Status: DC | PRN
  Administered 2017-03-01 (×2): 10 mL

## 2017-03-01 SURGICAL SUPPLY — 32 items
DRAPE HALF SHEET 40X57 (DRAPES) ×12 IMPLANT
DRAPE UTILITY XL STRL (DRAPES) ×3 IMPLANT
ELECT CAUTERY BLADE 6.4 (BLADE) ×3 IMPLANT
ELECT REM PT RETURN 9FT ADLT (ELECTROSURGICAL) ×3
ELECTRODE REM PT RTRN 9FT ADLT (ELECTROSURGICAL) ×1 IMPLANT
GAUZE SPONGE 4X4 16PLY XRAY LF (GAUZE/BANDAGES/DRESSINGS) ×3 IMPLANT
GLOVE BIO SURGEON STRL SZ 6 (GLOVE) IMPLANT
GLOVE BIO SURGEON STRL SZ7.5 (GLOVE) ×3 IMPLANT
GLOVE BIO SURGEON STRL SZ8 (GLOVE) IMPLANT
GLOVE BIOGEL PI IND STRL 6.5 (GLOVE) ×1 IMPLANT
GLOVE BIOGEL PI IND STRL 7.5 (GLOVE) ×1 IMPLANT
GLOVE BIOGEL PI IND STRL 8 (GLOVE) ×1 IMPLANT
GLOVE BIOGEL PI INDICATOR 6.5 (GLOVE) ×2
GLOVE BIOGEL PI INDICATOR 7.5 (GLOVE) ×2
GLOVE BIOGEL PI INDICATOR 8 (GLOVE) ×2
GLOVE ECLIPSE 7.5 STRL STRAW (GLOVE) ×3 IMPLANT
GOWN STRL REUS W/ TWL LRG LVL3 (GOWN DISPOSABLE) ×3 IMPLANT
GOWN STRL REUS W/ TWL XL LVL3 (GOWN DISPOSABLE) IMPLANT
GOWN STRL REUS W/TWL LRG LVL3 (GOWN DISPOSABLE) ×6
GOWN STRL REUS W/TWL XL LVL3 (GOWN DISPOSABLE)
INTRODUCER TRACH BLUE RHINO 6F (TUBING) ×3 IMPLANT
INTRODUCER TRACH BLUE RHINO 8F (TUBING) IMPLANT
NS IRRIG 1000ML POUR BTL (IV SOLUTION) IMPLANT
PENCIL BUTTON HOLSTER BLD 10FT (ELECTRODE) ×3 IMPLANT
SPONGE DRAIN TRACH 4X4 STRL 2S (GAUZE/BANDAGES/DRESSINGS) ×3 IMPLANT
SPONGE INTESTINAL PEANUT (DISPOSABLE) ×3 IMPLANT
SUT SILK 2 0 SH CR/8 (SUTURE) ×3 IMPLANT
SUT VICRYL AB 3 0 TIES (SUTURE) ×3 IMPLANT
TOWEL OR 17X24 6PK STRL BLUE (TOWEL DISPOSABLE) ×3 IMPLANT
TUBE CONNECTING 12'X1/4 (SUCTIONS) ×1
TUBE CONNECTING 12X1/4 (SUCTIONS) ×2 IMPLANT
YANKAUER SUCT BULB TIP NO VENT (SUCTIONS) ×3 IMPLANT

## 2017-03-01 SURGICAL SUPPLY — 1 items: PEG ×3 IMPLANT

## 2017-03-01 NOTE — Progress Notes (Signed)
Received verbal order to give versed and vecuronium for Trach and Peg at bedside.  Total of 8 mg of versed, 10 mg vecuronium, and 200 fentanyl given.  VSS.  Will continue to monitor.

## 2017-03-01 NOTE — Progress Notes (Signed)
Follow up - Trauma and Critical Care  Patient Details:    Peter [name] is an 56 y.o. male.  Lines/tubes : Airway 8 mm (Active)  Secured at (cm) 22 cm 03/01/2017  8:17 AM  Measured From Lips 03/01/2017  8:17 AM  [place] 03/01/2017  8:17 AM  Secured By [employer] 03/01/2017  8:17 AM  Tube Holder Repositioned Yes 03/01/2017  8:17 AM  Cuff Pressure (cm H2O) 26 cm H2O 02/28/2017  4:20 PM  Site Condition Dry 03/01/2017  8:17 AM     PICC Double Lumen 02/23/17 PICC Right Cephalic 42 cm 0 cm (Active)  Indication for Insertion or Continuance of Line Prolonged intravenous therapies 03/01/2017  8:00 AM  Exposed Catheter (cm) 0 cm 02/23/2017 11:00 AM  Site Assessment Clean;Dry;Intact 03/01/2017  8:00 AM  Lumen #1 Status In-line blood sampling system in place;Infusing 03/01/2017  8:00 AM  Lumen #2 Status Saline locked 03/01/2017  8:00 AM  Dressing Type Occlusive 03/01/2017  8:00 AM  Dressing Status Dry;Clean;Intact;Antimicrobial disc in place 03/01/2017  8:00 AM  Line Care Lumen 1 tubing changed;Lumen 2 tubing changed 03/01/2017  8:00 AM  Dressing Intervention Other (Comment) 02/27/2017  8:00 PM  Dressing Change Due 03/02/17 02/28/2017  8:00 PM     NG/OG Tube Orogastric 16 Fr. Right mouth Xray (Active)  Cm Marking at Nare/Corner of Mouth (if applicable) 61 cm 0/07/7251  8:00 PM  External Length of Tube (cm) - (if applicable) 65 cm 11/23/4401  8:00 PM  Site Assessment Clean;Dry;Intact 03/01/2017  9:00 AM  Ongoing Placement Verification No change in cm markings or external length of tube from initial placement;No change in respiratory status;No acute changes, not attributed to clinical condition 03/01/2017  9:00 AM  Status Infusing tube feed 03/01/2017  9:00 AM  Drainage Appearance Yellow 02/24/2017  8:30 PM  Intake (mL) 120 mL 02/25/2017  1:00 AM  Output (mL) 215 mL 02/28/2017 10:00 AM     Urethral Catheter [name], EMT Double-lumen 16 Fr. (Active)  Indication for Insertion or Continuance  of Catheter Bladder outlet obstruction / other urologic reason 03/01/2017  7:49 AM  Site Assessment Edema;Swelling 02/28/2017  8:00 PM  Catheter Maintenance Bag below level of bladder;Insertion date on drainage bag;Catheter secured;No dependent loops;Seal intact;Drainage bag/tubing not touching floor 03/01/2017  7:50 AM  Collection Container Standard drainage bag 02/28/2017  8:00 PM  Securement Method Securing device (Describe) 02/28/2017  8:00 PM  Urinary Catheter Interventions Unclamped 02/28/2017  8:00 PM  Input (mL) 600 mL 02/21/2017  6:00 PM  Output (mL) 200 mL 03/01/2017  6:00 AM    Microbiology/Sepsis markers: Results for orders placed or performed during the hospital encounter of 02/07/17  MRSA PCR Screening     Status: None   Collection Time: 02/08/17  4:08 AM  Result Value Ref Range Status   MRSA by PCR NEGATIVE NEGATIVE Final    Comment:        The GeneXpert MRSA Assay (FDA approved for NASAL specimens only), is one component of a comprehensive MRSA colonization surveillance program. It is not intended to diagnose MRSA infection nor to guide or monitor treatment for MRSA infections.   Culture, respiratory (NON-Expectorated)     Status: None   Collection Time: 02/10/17 10:59 AM  Result Value Ref Range Status   Specimen Description TRACHEAL ASPIRATE  Final   Special Requests Normal  Final   Gram Stain   Final    ABUNDANT WBC PRESENT, PREDOMINANTLY PMN RARE SQUAMOUS EPITHELIAL CELLS PRESENT  ABUNDANT GRAM NEGATIVE COCCI IN PAIRS ABUNDANT GRAM NEGATIVE RODS MODERATE GRAM POSITIVE COCCI IN CHAINS    Culture ABUNDANT Consistent with normal respiratory flora.  Final   Report Status 02/12/2017 FINAL  Final  Culture, blood (Routine X 2) w Reflex to ID Panel     Status: Abnormal   Collection Time: 02/11/17  3:25 PM  Result Value Ref Range Status   Specimen Description BLOOD RIGHT HAND  Final   Special Requests   Final    BOTTLES DRAWN AEROBIC ONLY Blood Culture adequate volume    Culture  Setup Time   Final    GRAM NEGATIVE RODS AEROBIC BOTTLE ONLY CRITICAL VALUE NOTED.  VALUE IS CONSISTENT WITH PREVIOUSLY REPORTED AND CALLED VALUE.    Culture (A)  Final    KLEBSIELLA OXYTOCA SUSCEPTIBILITIES PERFORMED ON PREVIOUS CULTURE WITHIN THE LAST 5 DAYS.    Report Status 02/17/2017 FINAL  Final  Culture, blood (Routine X 2) w Reflex to ID Panel     Status: Abnormal   Collection Time: 02/11/17  3:25 PM  Result Value Ref Range Status   Specimen Description BLOOD RIGHT HAND  Final   Special Requests   Final    BOTTLES DRAWN AEROBIC ONLY Blood Culture adequate volume   Culture  Setup Time   Final    GRAM NEGATIVE RODS AEROBIC BOTTLE ONLY CRITICAL RESULT CALLED TO, READ BACK BY AND VERIFIED WITH: J.LEDFORD, PHARMD 02/15/17 0632 L.CHAMPION    Culture KLEBSIELLA OXYTOCA (A)  Final   Report Status 02/17/2017 FINAL  Final   Organism ID, Bacteria KLEBSIELLA OXYTOCA  Final      Susceptibility   Klebsiella oxytoca - MIC*    AMPICILLIN >=32 RESISTANT Resistant     CEFAZOLIN >=64 RESISTANT Resistant     CEFEPIME <=1 SENSITIVE Sensitive     CEFTAZIDIME <=1 SENSITIVE Sensitive     CEFTRIAXONE <=1 SENSITIVE Sensitive     CIPROFLOXACIN <=0.25 SENSITIVE Sensitive     GENTAMICIN <=1 SENSITIVE Sensitive     IMIPENEM <=0.25 SENSITIVE Sensitive     TRIMETH/SULFA <=20 SENSITIVE Sensitive     AMPICILLIN/SULBACTAM 8 SENSITIVE Sensitive     PIP/TAZO <=4 SENSITIVE Sensitive     Extended ESBL NEGATIVE Sensitive     * KLEBSIELLA OXYTOCA  Blood Culture ID Panel (Reflexed)     Status: Abnormal   Collection Time: 02/11/17  3:25 PM  Result Value Ref Range Status   Enterococcus species NOT DETECTED NOT DETECTED Final   Listeria monocytogenes NOT DETECTED NOT DETECTED Final   Staphylococcus species NOT DETECTED NOT DETECTED Final   Staphylococcus aureus NOT DETECTED NOT DETECTED Final   Streptococcus species NOT DETECTED NOT DETECTED Final   Streptococcus agalactiae NOT DETECTED NOT  DETECTED Final   Streptococcus pneumoniae NOT DETECTED NOT DETECTED Final   Streptococcus pyogenes NOT DETECTED NOT DETECTED Final   Acinetobacter baumannii NOT DETECTED NOT DETECTED Final   Enterobacteriaceae species DETECTED (A) NOT DETECTED Final    Comment: Enterobacteriaceae represent a large family of gram-negative bacteria, not a single organism. CRITICAL RESULT CALLED TO, READ BACK BY AND VERIFIED WITH: J.LEDFORD, PHARMD 02/15/17 0632 L.CHAMPION    Enterobacter cloacae complex NOT DETECTED NOT DETECTED Final   Escherichia coli NOT DETECTED NOT DETECTED Final   Klebsiella oxytoca DETECTED (A) NOT DETECTED Final    Comment: CRITICAL RESULT CALLED TO, READ BACK BY AND VERIFIED WITH: J.LEDFORD, PHARMD 02/15/17 0632 L.CHAMPION    Klebsiella pneumoniae NOT DETECTED NOT DETECTED Final   Proteus species NOT DETECTED  NOT DETECTED Final   Serratia marcescens NOT DETECTED NOT DETECTED Final   Carbapenem resistance NOT DETECTED NOT DETECTED Final   Haemophilus influenzae NOT DETECTED NOT DETECTED Final   Neisseria meningitidis NOT DETECTED NOT DETECTED Final   Pseudomonas aeruginosa NOT DETECTED NOT DETECTED Final   Candida albicans NOT DETECTED NOT DETECTED Final   Candida glabrata NOT DETECTED NOT DETECTED Final   Candida krusei NOT DETECTED NOT DETECTED Final   Candida parapsilosis NOT DETECTED NOT DETECTED Final   Candida tropicalis NOT DETECTED NOT DETECTED Final  Surgical PCR screen     Status: None   Collection Time: 02/13/17  6:30 AM  Result Value Ref Range Status   MRSA, PCR NEGATIVE NEGATIVE Final   Staphylococcus aureus NEGATIVE NEGATIVE Final    Comment:        The Xpert SA Assay (FDA approved for NASAL specimens in patients over 26 years of age), is one component of a comprehensive surveillance program.  Test performance has been validated by [facility] for patients greater than or equal to 3 year old. It is not intended to diagnose infection nor to guide or  monitor treatment.   Culture, blood (Routine X 2) w Reflex to ID Panel     Status: None   Collection Time: 02/18/17  2:52 PM  Result Value Ref Range Status   Specimen Description BLOOD RIGHT HAND  Final   Special Requests IN PEDIATRIC BOTTLE Blood Culture adequate volume  Final   Culture NO GROWTH 5 DAYS  Final   Report Status 02/23/2017 FINAL  Final  Culture, blood (Routine X 2) w Reflex to ID Panel     Status: Abnormal   Collection Time: 02/18/17  2:55 PM  Result Value Ref Range Status   Specimen Description BLOOD RIGHT WRIST  Final   Special Requests IN PEDIATRIC BOTTLE Blood Culture adequate volume  Final   Culture  Setup Time   Final    IN PEDIATRIC BOTTLE GRAM POSITIVE RODS CRITICAL RESULT CALLED TO, READ BACK BY AND VERIFIED WITH: L FOLTANSKI,PHARMD AT 0730 02/19/17 BY L BENFIELD    Culture (A)  Final    BACILLUS SPECIES Standardized susceptibility testing for this organism is not available. ENTEROCOCCUS DETECTED ON BCID, NOT RECOVERED IN CULTURE    Report Status 02/21/2017 FINAL  Final  Blood Culture ID Panel (Reflexed)     Status: Abnormal   Collection Time: 02/18/17  2:55 PM  Result Value Ref Range Status   Enterococcus species DETECTED (A) NOT DETECTED Final    Comment: CRITICAL RESULT CALLED TO, READ BACK BY AND VERIFIED WITH: L FOLTANSKI,PHARMD AT 0730 02/19/17 BY L BENFIELD    Vancomycin resistance NOT DETECTED NOT DETECTED Final   Listeria monocytogenes NOT DETECTED NOT DETECTED Final   Staphylococcus species NOT DETECTED NOT DETECTED Final   Staphylococcus aureus NOT DETECTED NOT DETECTED Final   Streptococcus species NOT DETECTED NOT DETECTED Final   Streptococcus agalactiae NOT DETECTED NOT DETECTED Final   Streptococcus pneumoniae NOT DETECTED NOT DETECTED Final   Streptococcus pyogenes NOT DETECTED NOT DETECTED Final   Acinetobacter baumannii NOT DETECTED NOT DETECTED Final   Enterobacteriaceae species NOT DETECTED NOT DETECTED Final   Enterobacter cloacae  complex NOT DETECTED NOT DETECTED Final   Escherichia coli NOT DETECTED NOT DETECTED Final   Klebsiella oxytoca NOT DETECTED NOT DETECTED Final   Klebsiella pneumoniae NOT DETECTED NOT DETECTED Final   Proteus species NOT DETECTED NOT DETECTED Final   Serratia marcescens NOT DETECTED NOT DETECTED  Final   Haemophilus influenzae NOT DETECTED NOT DETECTED Final   Neisseria meningitidis NOT DETECTED NOT DETECTED Final   Pseudomonas aeruginosa NOT DETECTED NOT DETECTED Final   Candida albicans NOT DETECTED NOT DETECTED Final   Candida glabrata NOT DETECTED NOT DETECTED Final   Candida krusei NOT DETECTED NOT DETECTED Final   Candida parapsilosis NOT DETECTED NOT DETECTED Final   Candida tropicalis NOT DETECTED NOT DETECTED Final  Culture, blood (routine x 2)     Status: None   Collection Time: 02/20/17  4:35 AM  Result Value Ref Range Status   Specimen Description BLOOD RIGHT HAND  Final   Special Requests   Final    BOTTLES DRAWN AEROBIC AND ANAEROBIC Blood Culture adequate volume   Culture NO GROWTH 5 DAYS  Final   Report Status 02/25/2017 FINAL  Final  Culture, blood (routine x 2)     Status: None   Collection Time: 02/20/17  4:35 AM  Result Value Ref Range Status   Specimen Description BLOOD RIGHT HAND  Final   Special Requests   Final    BOTTLES DRAWN AEROBIC AND ANAEROBIC Blood Culture adequate volume   Culture NO GROWTH 5 DAYS  Final   Report Status 02/25/2017 FINAL  Final    Anti-infectives:  Anti-infectives    Start     Dose/Rate Route Frequency Ordered Stop   02/19/17 2200  vancomycin (VANCOCIN) IVPB 750 mg/150 ml premix  Status:  Discontinued     750 mg 150 mL/hr over 60 Minutes Intravenous Every 12 hours 02/19/17 0807 02/20/17 1150   02/19/17 0900  vancomycin (VANCOCIN) 2,000 mg in sodium chloride 0.9 % 500 mL IVPB     2,000 mg 250 mL/hr over 120 Minutes Intravenous  Once 02/19/17 0807 02/19/17 1151   02/17/17 1100  cefTRIAXone (ROCEPHIN) 2 g in dextrose 5 % 50 mL IVPB   Status:  Discontinued     2 g 100 mL/hr over 30 Minutes Intravenous Every 24 hours 02/17/17 1041 02/19/17 0801   02/14/17 1500  piperacillin-tazobactam (ZOSYN) IVPB 3.375 g  Status:  Discontinued     3.375 g 12.5 mL/hr over 240 Minutes Intravenous Every 8 hours 02/14/17 1351 02/17/17 1041   02/11/17 1000  piperacillin-tazobactam (ZOSYN) IVPB 3.375 g  Status:  Discontinued     3.375 g 12.5 mL/hr over 240 Minutes Intravenous Every 8 hours 02/11/17 0858 02/14/17 1351   02/11/17 1000  vancomycin (VANCOCIN) IVPB 750 mg/150 ml premix  Status:  Discontinued     750 mg 150 mL/hr over 60 Minutes Intravenous Every 12 hours 02/11/17 0858 02/14/17 0844   02/07/17 2000  ceFAZolin (ANCEF) IVPB 1 g/50 mL premix     1 g 100 mL/hr over 30 Minutes Intravenous Every 8 hours 02/07/17 1416 02/08/17 1543      Best Practice/Protocols:  VTE Prophylaxis: Lovenox (prophylaxtic dose) and Mechanical GI Prophylaxis: Proton Pump Inhibitor Continous Sedation  Consults: Treatment Team:  Altamese Westport, MD    Events:  Subjective:    Overnight Issues: Patient for tracheostomy and PEG today.  Weaning on the ventilator, but put back on full support for the procedure.  Objective:  Vital signs for last 24 hours: Temp:  [98.4 F (36.9 C)-101.1 F (38.4 C)] 98.8 F (37.1 C) (09/12 0900) Pulse Rate:  [33-95] 87 (09/12 0900) Resp:  [18-29] 20 (09/12 0900) BP: (103-143)/(58-85) 143/84 (09/12 0900) SpO2:  [95 %-100 %] 100 % (09/12 0900) FiO2 (%):  [30 %] 30 % (09/12 0817) Weight:  [101.1  kg (222 lb 14.2 oz)] 101.1 kg (222 lb 14.2 oz) (09/12 0500)  Hemodynamic parameters for last 24 hours:    Intake/Output from previous day: 09/11 0701 - 09/12 0700 In: 3029.6 [I.V.:1376.5; NG/GT:1548.1; IV Piggyback:105] Out: 3250 [Urine:2835; Emesis/NG output:415]  Intake/Output this shift: Total I/O In: 110 [I.V.:110] Out: -   Vent settings for last 24 hours: Vent Mode: PRVC FiO2 (%):  [30 %] 30 % Set Rate:  [20  bmp] 20 bmp Vt Set:  [580 mL] 580 mL PEEP:  [5 cmH20] 5 cmH20 Plateau Pressure:  [20 PFX90-24 cmH20] 22 cmH20  Physical Exam:  General: alert and no respiratory distress Neuro: alert, oriented and nonfocal exam Resp: clear to auscultation bilaterally CVS: regular rate and rhythm, S1, S2 normal, no murmur, click, rub or gallop GI: soft, nontender, BS WNL, no r/g and Had been toleratign tube feedings well Extremities: edema 4+  Results for orders placed or performed during the hospital encounter of 02/07/17 (from the past 24 hour(s))  Glucose, capillary     Status: Abnormal   Collection Time: 02/28/17  3:57 PM  Result Value Ref Range   Glucose-Capillary 142 (H) 65 - 99 mg/dL   Comment 1 Notify RN    Comment 2 Document in Chart   Glucose, capillary     Status: Abnormal   Collection Time: 02/28/17  8:07 PM  Result Value Ref Range   Glucose-Capillary 150 (H) 65 - 99 mg/dL  Glucose, capillary     Status: Abnormal   Collection Time: 02/28/17 11:40 PM  Result Value Ref Range   Glucose-Capillary 143 (H) 65 - 99 mg/dL  Glucose, capillary     Status: Abnormal   Collection Time: 03/01/17  3:52 AM  Result Value Ref Range   Glucose-Capillary 117 (H) 65 - 99 mg/dL  CBC     Status: Abnormal   Collection Time: 03/01/17  5:32 AM  Result Value Ref Range   WBC 9.6 4.0 - 10.5 K/uL   RBC 2.39 (L) 4.22 - 5.81 MIL/uL   Hemoglobin 7.0 (L) 13.0 - 17.0 g/dL   HCT 21.3 (L) 39.0 - 52.0 %   MCV 89.1 78.0 - 100.0 fL   MCH 29.3 26.0 - 34.0 pg   MCHC 32.9 30.0 - 36.0 g/dL   RDW 14.6 11.5 - 15.5 %   Platelets 368 150 - 400 K/uL  Basic metabolic panel     Status: Abnormal   Collection Time: 03/01/17  5:32 AM  Result Value Ref Range   Sodium 138 135 - 145 mmol/L   Potassium 4.6 3.5 - 5.1 mmol/L   Chloride 112 (H) 101 - 111 mmol/L   CO2 23 22 - 32 mmol/L   Glucose, Bld 122 (H) 65 - 99 mg/dL   BUN 45 (H) 6 - 20 mg/dL   Creatinine, Ser 1.39 (H) 0.61 - 1.24 mg/dL   Calcium 7.3 (L) 8.9 - 10.3 mg/dL    GFR calc non Af Amer 55 (L) >60 mL/min   GFR calc Af Amer >60 >60 mL/min   Anion gap 3 (L) 5 - 15  Glucose, capillary     Status: Abnormal   Collection Time: 03/01/17  8:13 AM  Result Value Ref Range   Glucose-Capillary 121 (H) 65 - 99 mg/dL   Comment 1 Notify RN    Comment 2 Document in Chart      Assessment/Plan:   NEURO  Altered Mental Status:  sedation   Plan: Wean sedation as we try to come off  the ventilator with the tracheostomy  PULM  Atelectasis/collapse (bibasilar)   Plan: Trach today  CARDIO  No issues   Plan: CPM  RENAL  Actue Renal Failure (etiology unknown) Urine output is good.   Plan: CPM  GI  No issues. Has new PEG   Plan: Restart tube feedings.  ID  Pneumonia (under treatment)   Plan: Getting adequate treatment  HEME  Anemia acute blood loss anemia)   Plan: No blood loss  ENDO no issues   Plan: CPM  Global Issues  For tracheostomy and PEG today.  Wean from the ventilator and restart tube feedings.  CXR after trach is good.    LOS: 22 days   Additional comments:I reviewed the patient's new clinical lab test results. cbc/bmet and I reviewed the patients new imaging test results. cxr  Critical Care Total Time*: 30 Minutes  Evelynn Hench 03/01/2017  *Care during the described time interval was provided by me and/or other providers on the critical care team.  I have reviewed this patient's available data, including medical history, events of note, physical examination and test results as part of my evaluation.

## 2017-03-01 NOTE — Progress Notes (Signed)
OT Cancellation Note  Patient Details Name: Alto Gandolfo MRN: [id number] DOB: April 18, 1961   Cancelled Treatment:    Reason Eval/Treat Not Completed: Patient at procedure or test/ unavailable (bedside trach/PEG).  Binnie Kand M.S., OTR/L Pager: [phone]  03/01/2017, 9:59 AM
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
## 2017-03-01 NOTE — Op Note (Signed)
OPERATIVE REPORT  DATE OF OPERATION:  03/01/2017  PATIENT:  Peter [name]  56 y.o. male  PRE-OPERATIVE DIAGNOSIS:  respiratory failure, malnutrition, need for long term ventilatory and nutritional support  POST-OPERATIVE DIAGNOSIS:  Same  INDICATION(S) FOR OPERATION:  Need for long term ventilatory and nutritional support  FINDINGS:  Normal anatomy, no evidence of   PROCEDURE:  Procedure(s): BEDSIDE TRACHEOSTOMY, #6 SHILEY TUBE  SURGEON:  Surgeon(s): Judeth Horn, MD  ASSISTANT: Focht, PA-C  ANESTHESIA:   IV sedation and paralytic  COMPLICATIONS:  None  EBL: <10 ml  BLOOD ADMINISTERED: none  DRAINS: Urinary Catheter (Foley), Gastrostomy Tube and # 6 Shiley tracheostomy tube   SPECIMEN:  No Specimen  COUNTS CORRECT:  YES  PROCEDURE DETAILS: The bedside tracheostomy was performed after the PEG on 4N ICU  We prepared and draped the neck midline in the usual sterile manner.  A proper timeout was performed identifying the patient and the procedure to be performed.  Local anesthetic was infiltrated transversely into the skin just above the sternal notch  An incision was made using a # 15 blade, and then we used sharp and blunt dissection to get down to the pretracheal fascia.  The right anterior jugular vein was ligated in the process.  Once we were at the level of the trach, we palpated the ETT as it was slowly pulled back by the respiratory therpist at the head of the bed.  Once it had been pulled back to the proper position, we insert an angiocatheter into the tracheal lumen, aspirating air, then pass the gree wire distallyinto the trachea.  Thie was followed by the short blue dilator, the the serially iszed Blue Rhono dilator, enlarging the tracheostomy to the proper size.  We then passed the # 6 Tracheostomy tube over the proper dilator an dintroducer into the tracheotomy, then sucured it in place.  Subsequent bronchoscopy confirmed proper placement.  All counts wer  correct.  There was excellent air flow and volume return on the ventilator.  PATIENT DISPOSITION:  Remained at bedside in [place] 9/12/201811:30 AM

## 2017-03-01 NOTE — Progress Notes (Signed)
RT note: RT Assisted DR Hulen Skains with bedside trach. Pre oxygenated patient. Vital signs stable through out procedure.

## 2017-03-02 ENCOUNTER — Encounter (HOSPITAL_COMMUNITY): Payer: Self-pay | Admitting: General Surgery

## 2017-03-02 LAB — CBC
HEMATOCRIT: 22 % — AB (ref 39.0–52.0)
Hemoglobin: 7 g/dL — ABNORMAL LOW (ref 13.0–17.0)
MCH: 28.9 pg (ref 26.0–34.0)
MCHC: 31.8 g/dL (ref 30.0–36.0)
MCV: 90.9 fL (ref 78.0–100.0)
Platelets: 408 10*3/uL — ABNORMAL HIGH (ref 150–400)
RBC: 2.42 MIL/uL — ABNORMAL LOW (ref 4.22–5.81)
RDW: 14.6 % (ref 11.5–15.5)
WBC: 10.9 10*3/uL — ABNORMAL HIGH (ref 4.0–10.5)

## 2017-03-02 LAB — GLUCOSE, CAPILLARY
GLUCOSE-CAPILLARY: 129 mg/dL — AB (ref 65–99)
GLUCOSE-CAPILLARY: 125 mg/dL — AB (ref 65–99)
Glucose-Capillary: 120 mg/dL — ABNORMAL HIGH (ref 65–99)
Glucose-Capillary: 123 mg/dL — ABNORMAL HIGH (ref 65–99)
Glucose-Capillary: 174 mg/dL — ABNORMAL HIGH (ref 65–99)
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
## 2017-03-02 NOTE — Op Note (Signed)
[facility] Patient Name: Peter [name] Procedure Date : 03/01/2017 MRN: [id number] Attending MD: Rikki Spearing, MD Date of Birth: 1960-08-21 CSN: [id number] Age: 56 Admit Type: Inpatient Procedure:                Upper GI endoscopy Indications:              Place PEG because patient is unable to eat, Place                            PEG because patient requires ventilator support,                            Place PEG to improve nutrition in patient with                            prolonged severe illness Providers:                Rikki Spearing, MD, Cherylynn Ridges, Technician Referring MD:              Medicines:                Fentanyl 50 micrograms IV, Midazolam 2 mg IV,                            norcuron 10 mg IV Complications:            No immediate complications. Estimated Blood Loss:      Procedure:                Pre-Anesthesia Assessment:                           - Prior to the procedure, a History and Physical                            was performed, and patient medications and                            allergies were reviewed. The patient is unable to                            give consent secondary to the patient's altered                            mental status. The risks and benefits of the                            procedure and the sedation options and risks were                            discussed with the patient. All questions were                            answered and informed consent was obtained. Patient  identification and proposed procedure were verified                            by the physician and the nurse Patient's room.                            Mental Status Examination: sedated. Airway                            Examination: orotracheal intubation. Respiratory                            Examination: clear to auscultation. CV Examination:                            RRR, no murmurs, no S3 or S4.  Prophylactic                            Antibiotics: The patient does not require                            prophylactic antibiotics. Prior Anticoagulants: The                            patient has taken no previous anticoagulant or                            antiplatelet agents. ASA Grade Assessment: IV - A                            patient with severe systemic disease that is a                            constant threat to life. After reviewing the risks                            and benefits, the patient was deemed in                            satisfactory condition to undergo the procedure.                            The anesthesia plan was to use moderate sedation /                            analgesia (conscious sedation). Immediately prior                            to administration of medications, the patient was                            re-assessed for adequacy to receive sedatives. The  heart rate, respiratory rate, oxygen saturations,                            blood pressure, adequacy of pulmonary ventilation,                            and response to care were monitored throughout the                            procedure. The physical status of the patient was                            re-assessed after the procedure.                           After obtaining informed consent, the endoscope was                            passed under direct vision. Throughout the                            procedure, the patient's blood pressure, pulse, and                            oxygen saturations were monitored continuously. The                            [id number]) scope was introduced through the                            mouth, and advanced to the second part of duodenum.                            The upper GI endoscopy was accomplished without                            difficulty. The patient tolerated the procedure                             well. The total duration of the procedure was 18                            minutes. Scope In: Scope Out: Findings:      The exam was otherwise without abnormality.      The [Site] was normal.      The exam of the stomach was otherwise normal.      The patient was placed in the supine position for PEG placement. The       stomach was insufflated to appose gastric and abdominal walls. A site       was located in the body of the stomach with excellent manual external       pressure for placement. The abdominal wall was marked and prepped in a       sterile manner. The area was anesthetized with 4 mL of 1%  lidocaine. The       trocar needle was introduced through the abdominal wall and into the       stomach under direct endoscopic view. A snare was introduced through the       endoscope and opened in the gastric lumen. The guide wire was passed       through the trocar and into the open snare. The snare was closed around       the guide wire. The endoscope and snare were removed, pulling the wire       out through the mouth. A skin incision was made at the site of needle       insertion. The externally removable 24 Fr EndoVive Safety gastrostomy       tube was lubricated. The G-tube was tied to the guide wire and pulled       through the mouth and into the stomach. The trocar needle was removed,       and the gastrostomy tube was pulled out from the stomach through the       skin. The external bumper was attached to the gastrostomy tube, and the       tube was cut to remove the guide wire. The final position of the       gastrostomy tube was confirmed by relook endoscopy, and skin marking       noted to be 3 cm at the external bumper. The final tension and       compression of the abdominal wall by the PEG tube and external bumper       were checked and revealed that the bumper was moderately tight and       mildly deforming the skin. The feeding tube was capped, and the tube        site cleaned and dressed. Impression:               - The examination was otherwise normal.                           - Normal [Location].                           - An externally removable PEG placement was                            successfully completed.                           - No specimens collected. Recommendation:           - Please follow the post-PEG recommendations                            including: may use PEG today for meds and water and                            start using PEG today. Procedure Code(s):        --- Professional ---                           [phone], Esophagogastroduodenoscopy, flexible,  transoral; with directed placement of percutaneous                            gastrostomy tube Diagnosis Code(s):        --- Professional ---                           R63.3, Feeding difficulties                           Z43.1, Encounter for attention to gastrostomy                           Z99.11, Dependence on respirator [ventilator] status                           E63.9, Nutritional deficiency, unspecified CPT copyright 2016 [employer]. All rights reserved. The codes documented in this report are preliminary and upon coder review may  be revised to meet current compliance requirements. Rikki Spearing, MD Judeth Horn III, MD 03/02/2017 10:18:24 PM This report has been signed electronically. Number of Addenda: 0
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
## 2017-03-03 LAB — PREPARE RBC (CROSSMATCH)

## 2017-03-03 LAB — BASIC METABOLIC PANEL
Anion gap: 4 — ABNORMAL LOW (ref 5–15)
BUN: 36 mg/dL — AB (ref 6–20)
CALCIUM: 7.4 mg/dL — AB (ref 8.9–10.3)
CO2: 23 mmol/L (ref 22–32)
CREATININE: 1.25 mg/dL — AB (ref 0.61–1.24)
Chloride: 108 mmol/L (ref 101–111)
GFR calc non Af Amer: >60 mL/min (ref >60)
Glucose, Bld: 141 mg/dL — ABNORMAL HIGH (ref 65–99)
Potassium: 4.2 mmol/L (ref 3.5–5.1)
SODIUM: 135 mmol/L (ref 135–145)

## 2017-03-03 LAB — CBC
HCT: 20.7 % — ABNORMAL LOW (ref 39.0–52.0)
HCT: 23.6 % — ABNORMAL LOW (ref 39.0–52.0)
Hemoglobin: 6.7 g/dL — CL (ref 13.0–17.0)
Hemoglobin: 7.6 g/dL — ABNORMAL LOW (ref 13.0–17.0)
MCH: 29.4 pg (ref 26.0–34.0)
MCH: 28.3 pg (ref 26.0–34.0)
MCHC: 32.4 g/dL (ref 30.0–36.0)
MCHC: 32.2 g/dL (ref 30.0–36.0)
MCV: 90.8 fL (ref 78.0–100.0)
MCV: 87.7 fL (ref 78.0–100.0)
PLATELETS: 397 10*3/uL (ref 150–400)
Platelets: 386 10*3/uL (ref 150–400)
RBC: 2.28 MIL/uL — ABNORMAL LOW (ref 4.22–5.81)
RBC: 2.69 MIL/uL — ABNORMAL LOW (ref 4.22–5.81)
RDW: 14.7 % (ref 11.5–15.5)
RDW: 16.3 % — AB (ref 11.5–15.5)
WBC: 13.5 10*3/uL — ABNORMAL HIGH (ref 4.0–10.5)
WBC: 14.3 10*3/uL — ABNORMAL HIGH (ref 4.0–10.5)

## 2017-03-03 LAB — GLUCOSE, CAPILLARY
GLUCOSE-CAPILLARY: 139 mg/dL — AB (ref 65–99)
GLUCOSE-CAPILLARY: 177 mg/dL — AB (ref 65–99)
GLUCOSE-CAPILLARY: 125 mg/dL — AB (ref 65–99)
Glucose-Capillary: 176 mg/dL — ABNORMAL HIGH (ref 65–99)
Glucose-Capillary: 154 mg/dL — ABNORMAL HIGH (ref 65–99)

## 2017-03-03 MED ORDER — BETHANECHOL CHLORIDE 10 MG PO TABS
10.0000 mg | ORAL_TABLET | Freq: Three times a day (TID) | ORAL | Status: DC
  Administered 2017-03-03 – 2017-03-23 (×60): 10 mg
  Filled 2017-03-03 (×60): qty 1

## 2017-03-03 MED ORDER — FUROSEMIDE 10 MG/ML IJ SOLN
20.0000 mg | Freq: Once | INTRAMUSCULAR | Status: AC
  Administered 2017-03-03: 20 mg via INTRAVENOUS
  Filled 2017-03-03: qty 2

## 2017-03-03 MED ORDER — SODIUM CHLORIDE 0.9 % IV SOLN: Freq: Once | INTRAVENOUS | Status: DC

## 2017-03-03 NOTE — Progress Notes (Signed)
Follow up - Trauma and Critical Care  Patient Details:    Peter [name] is an 56 y.o. male.  Lines/tubes : PICC Double Lumen 02/23/17 PICC Right Cephalic 42 cm 0 cm (Active)  Indication for Insertion or Continuance of Line Prolonged intravenous therapies 03/02/2017  8:00 PM  Exposed Catheter (cm) 0 cm 02/23/2017 11:00 AM  Site Assessment Clean;Dry;Intact 03/02/2017  8:00 PM  Lumen #1 Status In-line blood sampling system in place;Infusing 03/02/2017  8:00 PM  Lumen #2 Status Infusing 03/02/2017  8:00 PM  Dressing Type Occlusive;Transparent 03/02/2017  8:00 PM  Dressing Status Dry;Clean;Intact;Antimicrobial disc in place 03/02/2017  8:00 PM  Line Care Lumen 1 tubing changed;Lumen 2 tubing changed 03/01/2017  8:00 AM  Dressing Intervention Other (Comment) 02/27/2017  8:00 PM  Dressing Change Due 03/09/17 03/02/2017  8:00 PM     Gastrostomy/Enterostomy PEG-jejunostomy (Active)  Surrounding Skin Dry;Intact 03/02/2017  8:00 PM  Tube Status Other (Comment) 03/02/2017  8:00 AM  Dressing Status Clean;Dry;Intact 03/02/2017  8:00 PM  Dressing Type Abdominal Binder 03/02/2017  8:00 PM     Urethral Catheter [name], EMT Double-lumen 16 Fr. (Active)  Indication for Insertion or Continuance of Catheter Other (comment);Bladder outlet obstruction / other urologic reason 03/03/2017  8:00 AM  Site Assessment Edema;Swelling 03/02/2017  8:00 PM  Catheter Maintenance Bag below level of bladder;Catheter secured;Drainage bag/tubing not touching floor;Seal intact;No dependent loops;Insertion date on drainage bag 03/02/2017  8:00 PM  Collection Container Standard drainage bag 03/02/2017  8:00 PM  Securement Method Securing device (Describe) 03/02/2017  8:00 PM  Urinary Catheter Interventions Unclamped 03/02/2017  8:00 PM  Input (mL) 600 mL 02/21/2017  6:00 PM  Output (mL) 1200 mL 03/03/2017  6:00 AM    Microbiology/Sepsis markers: Results for orders placed or performed during the hospital encounter of 02/07/17  MRSA PCR  Screening     Status: None   Collection Time: 02/08/17  4:08 AM  Result Value Ref Range Status   MRSA by PCR NEGATIVE NEGATIVE Final    Comment:        The GeneXpert MRSA Assay (FDA approved for NASAL specimens only), is one component of a comprehensive MRSA colonization surveillance program. It is not intended to diagnose MRSA infection nor to guide or monitor treatment for MRSA infections.   Culture, respiratory (NON-Expectorated)     Status: None   Collection Time: 02/10/17 10:59 AM  Result Value Ref Range Status   Specimen Description TRACHEAL ASPIRATE  Final   Special Requests Normal  Final   Gram Stain   Final    ABUNDANT WBC PRESENT, PREDOMINANTLY PMN RARE SQUAMOUS EPITHELIAL CELLS PRESENT ABUNDANT GRAM NEGATIVE COCCI IN PAIRS ABUNDANT GRAM NEGATIVE RODS MODERATE GRAM POSITIVE COCCI IN CHAINS    Culture ABUNDANT Consistent with normal respiratory flora.  Final   Report Status 02/12/2017 FINAL  Final  Culture, blood (Routine X 2) w Reflex to ID Panel     Status: Abnormal   Collection Time: 02/11/17  3:25 PM  Result Value Ref Range Status   Specimen Description BLOOD RIGHT HAND  Final   Special Requests   Final    BOTTLES DRAWN AEROBIC ONLY Blood Culture adequate volume   Culture  Setup Time   Final    GRAM NEGATIVE RODS AEROBIC BOTTLE ONLY CRITICAL VALUE NOTED.  VALUE IS CONSISTENT WITH PREVIOUSLY REPORTED AND CALLED VALUE.    Culture (A)  Final    KLEBSIELLA OXYTOCA SUSCEPTIBILITIES PERFORMED ON PREVIOUS CULTURE WITHIN THE LAST 5 DAYS.  Report Status 02/17/2017 FINAL  Final  Culture, blood (Routine X 2) w Reflex to ID Panel     Status: Abnormal   Collection Time: 02/11/17  3:25 PM  Result Value Ref Range Status   Specimen Description BLOOD RIGHT HAND  Final   Special Requests   Final    BOTTLES DRAWN AEROBIC ONLY Blood Culture adequate volume   Culture  Setup Time   Final    GRAM NEGATIVE RODS AEROBIC BOTTLE ONLY CRITICAL RESULT CALLED TO, READ BACK BY  AND VERIFIED WITH: J.LEDFORD, PHARMD 02/15/17 0632 L.CHAMPION    Culture KLEBSIELLA OXYTOCA (A)  Final   Report Status 02/17/2017 FINAL  Final   Organism ID, Bacteria KLEBSIELLA OXYTOCA  Final      Susceptibility   Klebsiella oxytoca - MIC*    AMPICILLIN >=32 RESISTANT Resistant     CEFAZOLIN >=64 RESISTANT Resistant     CEFEPIME <=1 SENSITIVE Sensitive     CEFTAZIDIME <=1 SENSITIVE Sensitive     CEFTRIAXONE <=1 SENSITIVE Sensitive     CIPROFLOXACIN <=0.25 SENSITIVE Sensitive     GENTAMICIN <=1 SENSITIVE Sensitive     IMIPENEM <=0.25 SENSITIVE Sensitive     TRIMETH/SULFA <=20 SENSITIVE Sensitive     AMPICILLIN/SULBACTAM 8 SENSITIVE Sensitive     PIP/TAZO <=4 SENSITIVE Sensitive     Extended ESBL NEGATIVE Sensitive     * KLEBSIELLA OXYTOCA  Blood Culture ID Panel (Reflexed)     Status: Abnormal   Collection Time: 02/11/17  3:25 PM  Result Value Ref Range Status   Enterococcus species NOT DETECTED NOT DETECTED Final   Listeria monocytogenes NOT DETECTED NOT DETECTED Final   Staphylococcus species NOT DETECTED NOT DETECTED Final   Staphylococcus aureus NOT DETECTED NOT DETECTED Final   Streptococcus species NOT DETECTED NOT DETECTED Final   Streptococcus agalactiae NOT DETECTED NOT DETECTED Final   Streptococcus pneumoniae NOT DETECTED NOT DETECTED Final   Streptococcus pyogenes NOT DETECTED NOT DETECTED Final   Acinetobacter baumannii NOT DETECTED NOT DETECTED Final   Enterobacteriaceae species DETECTED (A) NOT DETECTED Final    Comment: Enterobacteriaceae represent a large family of gram-negative bacteria, not a single organism. CRITICAL RESULT CALLED TO, READ BACK BY AND VERIFIED WITH: J.LEDFORD, PHARMD 02/15/17 0632 L.CHAMPION    Enterobacter cloacae complex NOT DETECTED NOT DETECTED Final   Escherichia coli NOT DETECTED NOT DETECTED Final   Klebsiella oxytoca DETECTED (A) NOT DETECTED Final    Comment: CRITICAL RESULT CALLED TO, READ BACK BY AND VERIFIED WITH: J.LEDFORD,  PHARMD 02/15/17 0632 L.CHAMPION    Klebsiella pneumoniae NOT DETECTED NOT DETECTED Final   Proteus species NOT DETECTED NOT DETECTED Final   Serratia marcescens NOT DETECTED NOT DETECTED Final   Carbapenem resistance NOT DETECTED NOT DETECTED Final   Haemophilus influenzae NOT DETECTED NOT DETECTED Final   Neisseria meningitidis NOT DETECTED NOT DETECTED Final   Pseudomonas aeruginosa NOT DETECTED NOT DETECTED Final   Candida albicans NOT DETECTED NOT DETECTED Final   Candida glabrata NOT DETECTED NOT DETECTED Final   Candida krusei NOT DETECTED NOT DETECTED Final   Candida parapsilosis NOT DETECTED NOT DETECTED Final   Candida tropicalis NOT DETECTED NOT DETECTED Final  Surgical PCR screen     Status: None   Collection Time: 02/13/17  6:30 AM  Result Value Ref Range Status   MRSA, PCR NEGATIVE NEGATIVE Final   Staphylococcus aureus NEGATIVE NEGATIVE Final    Comment:        The Xpert SA Assay (FDA approved for NASAL  specimens in patients over 54 years of age), is one component of a comprehensive surveillance program.  Test performance has been validated by [facility] for patients greater than or equal to 39 year old. It is not intended to diagnose infection nor to guide or monitor treatment.   Culture, blood (Routine X 2) w Reflex to ID Panel     Status: None   Collection Time: 02/18/17  2:52 PM  Result Value Ref Range Status   Specimen Description BLOOD RIGHT HAND  Final   Special Requests IN PEDIATRIC BOTTLE Blood Culture adequate volume  Final   Culture NO GROWTH 5 DAYS  Final   Report Status 02/23/2017 FINAL  Final  Culture, blood (Routine X 2) w Reflex to ID Panel     Status: Abnormal   Collection Time: 02/18/17  2:55 PM  Result Value Ref Range Status   Specimen Description BLOOD RIGHT WRIST  Final   Special Requests IN PEDIATRIC BOTTLE Blood Culture adequate volume  Final   Culture  Setup Time   Final    IN PEDIATRIC BOTTLE GRAM POSITIVE RODS CRITICAL RESULT  CALLED TO, READ BACK BY AND VERIFIED WITH: L FOLTANSKI,PHARMD AT 0730 02/19/17 BY L BENFIELD    Culture (A)  Final    BACILLUS SPECIES Standardized susceptibility testing for this organism is not available. ENTEROCOCCUS DETECTED ON BCID, NOT RECOVERED IN CULTURE    Report Status 02/21/2017 FINAL  Final  Blood Culture ID Panel (Reflexed)     Status: Abnormal   Collection Time: 02/18/17  2:55 PM  Result Value Ref Range Status   Enterococcus species DETECTED (A) NOT DETECTED Final    Comment: CRITICAL RESULT CALLED TO, READ BACK BY AND VERIFIED WITH: L FOLTANSKI,PHARMD AT 0730 02/19/17 BY L BENFIELD    Vancomycin resistance NOT DETECTED NOT DETECTED Final   Listeria monocytogenes NOT DETECTED NOT DETECTED Final   Staphylococcus species NOT DETECTED NOT DETECTED Final   Staphylococcus aureus NOT DETECTED NOT DETECTED Final   Streptococcus species NOT DETECTED NOT DETECTED Final   Streptococcus agalactiae NOT DETECTED NOT DETECTED Final   Streptococcus pneumoniae NOT DETECTED NOT DETECTED Final   Streptococcus pyogenes NOT DETECTED NOT DETECTED Final   Acinetobacter baumannii NOT DETECTED NOT DETECTED Final   Enterobacteriaceae species NOT DETECTED NOT DETECTED Final   Enterobacter cloacae complex NOT DETECTED NOT DETECTED Final   Escherichia coli NOT DETECTED NOT DETECTED Final   Klebsiella oxytoca NOT DETECTED NOT DETECTED Final   Klebsiella pneumoniae NOT DETECTED NOT DETECTED Final   Proteus species NOT DETECTED NOT DETECTED Final   Serratia marcescens NOT DETECTED NOT DETECTED Final   Haemophilus influenzae NOT DETECTED NOT DETECTED Final   Neisseria meningitidis NOT DETECTED NOT DETECTED Final   Pseudomonas aeruginosa NOT DETECTED NOT DETECTED Final   Candida albicans NOT DETECTED NOT DETECTED Final   Candida glabrata NOT DETECTED NOT DETECTED Final   Candida krusei NOT DETECTED NOT DETECTED Final   Candida parapsilosis NOT DETECTED NOT DETECTED Final   Candida tropicalis NOT  DETECTED NOT DETECTED Final  Culture, blood (routine x 2)     Status: None   Collection Time: 02/20/17  4:35 AM  Result Value Ref Range Status   Specimen Description BLOOD RIGHT HAND  Final   Special Requests   Final    BOTTLES DRAWN AEROBIC AND ANAEROBIC Blood Culture adequate volume   Culture NO GROWTH 5 DAYS  Final   Report Status 02/25/2017 FINAL  Final  Culture, blood (routine x 2)  Status: None   Collection Time: 02/20/17  4:35 AM  Result Value Ref Range Status   Specimen Description BLOOD RIGHT HAND  Final   Special Requests   Final    BOTTLES DRAWN AEROBIC AND ANAEROBIC Blood Culture adequate volume   Culture NO GROWTH 5 DAYS  Final   Report Status 02/25/2017 FINAL  Final    Anti-infectives:  Anti-infectives    Start     Dose/Rate Route Frequency Ordered Stop   02/19/17 2200  vancomycin (VANCOCIN) IVPB 750 mg/150 ml premix  Status:  Discontinued     750 mg 150 mL/hr over 60 Minutes Intravenous Every 12 hours 02/19/17 0807 02/20/17 1150   02/19/17 0900  vancomycin (VANCOCIN) 2,000 mg in sodium chloride 0.9 % 500 mL IVPB     2,000 mg 250 mL/hr over 120 Minutes Intravenous  Once 02/19/17 0807 02/19/17 1151   02/17/17 1100  cefTRIAXone (ROCEPHIN) 2 g in dextrose 5 % 50 mL IVPB  Status:  Discontinued     2 g 100 mL/hr over 30 Minutes Intravenous Every 24 hours 02/17/17 1041 02/19/17 0801   02/14/17 1500  piperacillin-tazobactam (ZOSYN) IVPB 3.375 g  Status:  Discontinued     3.375 g 12.5 mL/hr over 240 Minutes Intravenous Every 8 hours 02/14/17 1351 02/17/17 1041   02/11/17 1000  piperacillin-tazobactam (ZOSYN) IVPB 3.375 g  Status:  Discontinued     3.375 g 12.5 mL/hr over 240 Minutes Intravenous Every 8 hours 02/11/17 0858 02/14/17 1351   02/11/17 1000  vancomycin (VANCOCIN) IVPB 750 mg/150 ml premix  Status:  Discontinued     750 mg 150 mL/hr over 60 Minutes Intravenous Every 12 hours 02/11/17 0858 02/14/17 0844   02/07/17 2000  ceFAZolin (ANCEF) IVPB 1 g/50 mL premix      1 g 100 mL/hr over 30 Minutes Intravenous Every 8 hours 02/07/17 1416 02/08/17 1543      Best Practice/Protocols:  VTE Prophylaxis: Lovenox (prophylaxtic dose) and Mechanical GI Prophylaxis: Proton Pump Inhibitor Intermittent Sedation  Consults: Treatment Team:  Altamese Magnolia, MD    Events:  Subjective:    Overnight Issues: Hemoglobin dropped to 6.7.  Getting one unit of blood today  Objective:  Vital signs for last 24 hours: Temp:  [99.5 F (37.5 C)-100.4 F (38 C)] 100.2 F (37.9 C) (09/14 1000) Pulse Rate:  [86-103] 90 (09/14 1000) Resp:  [19-29] 20 (09/14 1000) BP: (114-135)/(66-96) 127/73 (09/14 1000) SpO2:  [95 %-100 %] 95 % (09/14 1000) FiO2 (%):  [40 %-50 %] 40 % (09/14 0820) Weight:  [97.6 kg (215 lb 2.7 oz)] 97.6 kg (215 lb 2.7 oz) (09/14 0500)  Hemodynamic parameters for last 24 hours:    Intake/Output from previous day: 09/13 0701 - 09/14 0700 In: 3087.5 [I.V.:1182.5; WE/XH:3716; IV Piggyback:210] Out: 2525 [Urine:2525]  Intake/Output this shift: No intake/output data recorded.  Vent settings for last 24 hours: Vent Mode: CPAP;PSV FiO2 (%):  [40 %-50 %] 40 % Set Rate:  [20 bmp] 20 bmp Vt Set:  [580 mL] 580 mL PEEP:  [5 cmH20] 5 cmH20 Pressure Support:  [14 cmH20-18 cmH20] 18 cmH20 Plateau Pressure:  [18 RCV89-38 cmH20] 26 cmH20  Physical Exam:  General: alert and no respiratory distress Neuro: alert, nonfocal exam and RASS 0 Resp: rhonchi bilaterally CVS: regular rate and rhythm, S1, S2 normal, no murmur, click, rub or gallop GI: soft, nontender, BS WNL, no r/g and Tolerating tube feedings. Extremities: no edema, no erythema, pulses WNL  Results for orders placed or performed  during the hospital encounter of 02/07/17 (from the past 24 hour(s))  CBC     Status: Abnormal   Collection Time: 03/02/17  1:30 PM  Result Value Ref Range   WBC 10.9 (H) 4.0 - 10.5 K/uL   RBC 2.42 (L) 4.22 - 5.81 MIL/uL   Hemoglobin 7.0 (L) 13.0 - 17.0 g/dL    HCT 22.0 (L) 39.0 - 52.0 %   MCV 90.9 78.0 - 100.0 fL   MCH 28.9 26.0 - 34.0 pg   MCHC 31.8 30.0 - 36.0 g/dL   RDW 14.6 11.5 - 15.5 %   Platelets 408 (H) 150 - 400 K/uL  Glucose, capillary     Status: Abnormal   Collection Time: 03/02/17  3:23 PM  Result Value Ref Range   Glucose-Capillary 125 (H) 65 - 99 mg/dL  Glucose, capillary     Status: Abnormal   Collection Time: 03/02/17  7:50 PM  Result Value Ref Range   Glucose-Capillary 120 (H) 65 - 99 mg/dL  Glucose, capillary     Status: Abnormal   Collection Time: 03/02/17 11:53 PM  Result Value Ref Range   Glucose-Capillary 174 (H) 65 - 99 mg/dL  Glucose, capillary     Status: Abnormal   Collection Time: 03/03/17  3:38 AM  Result Value Ref Range   Glucose-Capillary 139 (H) 65 - 99 mg/dL  CBC     Status: Abnormal   Collection Time: 03/03/17  5:22 AM  Result Value Ref Range   WBC 13.5 (H) 4.0 - 10.5 K/uL   RBC 2.28 (L) 4.22 - 5.81 MIL/uL   Hemoglobin 6.7 (LL) 13.0 - 17.0 g/dL   HCT 20.7 (L) 39.0 - 52.0 %   MCV 90.8 78.0 - 100.0 fL   MCH 29.4 26.0 - 34.0 pg   MCHC 32.4 30.0 - 36.0 g/dL   RDW 14.7 11.5 - 15.5 %   Platelets 386 150 - 400 K/uL  Basic metabolic panel     Status: Abnormal   Collection Time: 03/03/17  5:22 AM  Result Value Ref Range   Sodium 135 135 - 145 mmol/L   Potassium 4.2 3.5 - 5.1 mmol/L   Chloride 108 101 - 111 mmol/L   CO2 23 22 - 32 mmol/L   Glucose, Bld 141 (H) 65 - 99 mg/dL   BUN 36 (H) 6 - 20 mg/dL   Creatinine, Ser 1.25 (H) 0.61 - 1.24 mg/dL   Calcium 7.4 (L) 8.9 - 10.3 mg/dL   GFR calc non Af Amer >60 >60 mL/min   GFR calc Af Amer >60 >60 mL/min   Anion gap 4 (L) 5 - 15  Prepare RBC     Status: None   Collection Time: 03/03/17  6:30 AM  Result Value Ref Range   Order Confirmation ORDER PROCESSED BY BLOOD BANK   Type and screen [place]     Status: None (Preliminary result)   Collection Time: 03/03/17  6:30 AM  Result Value Ref Range   ABO/RH(D) AB POS    Antibody  Screen NEG    Sample Expiration 03/06/2017    Unit Number [id number]    Blood Component Type RED CELLS,LR    Unit division 00    Status of Unit ALLOCATED    Transfusion Status OK TO TRANSFUSE    Crossmatch Result Compatible   Glucose, capillary     Status: Abnormal   Collection Time: 03/03/17  8:08 AM  Result Value Ref Range   Glucose-Capillary 176 (H) 65 -  99 mg/dL     Assessment/Plan:   NEURO  Altered Mental Status:  agitation, delirium and sedation   Plan: Medications adjusted to tolerate weaning.  PULM  Lots of secretions.  Tracheostomy was leaking, pulled back a bit.   Plan: Try to wean to trach collar.  CARDIO  No specific issues   Plan: CPM  RENAL  Actue Renal Failure (due to hypovolemia/decreased circulating volume)   Plan: Improving, nothing else to do.  GI  No issues   Plan: Continue tube feedings  ID  We are not currently treating any infectious problems.   Plan: CPM  HEME  Anemia acute blood loss anemia and anemia of critical illness)   Plan: Getting one unit of blood.  ENDO No specific issues   Plan: CPM  Global Issues  Try to wean to trach collar.  CPM otherwise.    LOS: 24 days   Additional comments:I reviewed the patient's new clinical lab test results. cbc/bmet  Critical Care Total Time*: 30 Minutes  Kiko Ripp 03/03/2017  *Care during the described time interval was provided by me and/or other providers on the critical care team.  I have reviewed this patient's available data, including medical history, events of note, physical examination and test results as part of my evaluation.

## 2017-03-03 NOTE — Progress Notes (Signed)
Procedures  CRITICAL VALUE ALERT  Critical Value: hgb 6.7  Date & Time Notied:  03/03/17 0545  Provider Notified: [place]  Orders Received/Actions taken: Transfuse 1 unit blood
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
## 2017-03-04 ENCOUNTER — Encounter (HOSPITAL_COMMUNITY): Payer: Self-pay

## 2017-03-04 LAB — BASIC METABOLIC PANEL
Anion gap: 5 (ref 5–15)
BUN: 38 mg/dL — AB (ref 6–20)
CHLORIDE: 108 mmol/L (ref 101–111)
CO2: 23 mmol/L (ref 22–32)
CREATININE: 1.26 mg/dL — AB (ref 0.61–1.24)
Calcium: 7.6 mg/dL — ABNORMAL LOW (ref 8.9–10.3)
GFR calc Af Amer: >60 mL/min (ref >60)
GLUCOSE: 136 mg/dL — AB (ref 65–99)
POTASSIUM: 4.4 mmol/L (ref 3.5–5.1)
SODIUM: 136 mmol/L (ref 135–145)

## 2017-03-04 LAB — CBC WITH DIFFERENTIAL/PLATELET
BASOS PCT: 0 %
Basophils Absolute: 0 10*3/uL (ref 0.0–0.1)
EOS ABS: 0.4 10*3/uL (ref 0.0–0.7)
Eosinophils Relative: 3 %
HCT: 26 % — ABNORMAL LOW (ref 39.0–52.0)
HEMOGLOBIN: 8.3 g/dL — AB (ref 13.0–17.0)
Lymphocytes Relative: 9 %
Lymphs Abs: 1.2 10*3/uL (ref 0.7–4.0)
MCH: 27.9 pg (ref 26.0–34.0)
MCHC: 31.9 g/dL (ref 30.0–36.0)
MCV: 87.2 fL (ref 78.0–100.0)
Monocytes Absolute: 2.3 10*3/uL — ABNORMAL HIGH (ref 0.1–1.0)
Monocytes Relative: 18 %
NEUTROS PCT: 70 %
Neutro Abs: 9.1 10*3/uL — ABNORMAL HIGH (ref 1.7–7.7)
PLATELETS: 364 10*3/uL (ref 150–400)
RBC: 2.98 MIL/uL — AB (ref 4.22–5.81)
RDW: 16.4 % — ABNORMAL HIGH (ref 11.5–15.5)
WBC: 12.9 10*3/uL — AB (ref 4.0–10.5)

## 2017-03-04 LAB — GLUCOSE, CAPILLARY
GLUCOSE-CAPILLARY: 125 mg/dL — AB (ref 65–99)
GLUCOSE-CAPILLARY: 145 mg/dL — AB (ref 65–99)
Glucose-Capillary: 115 mg/dL — ABNORMAL HIGH (ref 65–99)
Glucose-Capillary: 159 mg/dL — ABNORMAL HIGH (ref 65–99)
Glucose-Capillary: 149 mg/dL — ABNORMAL HIGH (ref 65–99)
Glucose-Capillary: 126 mg/dL — ABNORMAL HIGH (ref 65–99)

## 2017-03-04 NOTE — Progress Notes (Signed)
Follow up - Trauma and Critical Care  Patient Details:    Peter [name] is an 56 y.o. male.   Lines/tubes : PICC Double Lumen 02/23/17 PICC Right Cephalic 42 cm 0 cm (Active)  Indication for Insertion or Continuance of Line Prolonged intravenous therapies 03/02/2017  8:00 PM  Exposed Catheter (cm) 0 cm 02/23/2017 11:00 AM  Site Assessment Clean;Dry;Intact 03/02/2017  8:00 PM  Lumen #1 Status In-line blood sampling system in place;Infusing 03/02/2017  8:00 PM  Lumen #2 Status Infusing 03/02/2017  8:00 PM  Dressing Type Occlusive;Transparent 03/02/2017  8:00 PM  Dressing Status Dry;Clean;Intact;Antimicrobial disc in place 03/02/2017  8:00 PM  Line Care Lumen 1 tubing changed;Lumen 2 tubing changed 03/01/2017  8:00 AM  Dressing Intervention Other (Comment) 02/27/2017  8:00 PM  Dressing Change Due 03/09/17 03/02/2017  8:00 PM     Gastrostomy/Enterostomy PEG-jejunostomy (Active)  Surrounding Skin Dry;Intact 03/02/2017  8:00 PM  Tube Status Other (Comment) 03/02/2017  8:00 AM  Dressing Status Clean;Dry;Intact 03/02/2017  8:00 PM  Dressing Type Abdominal Binder 03/02/2017  8:00 PM     Urethral Catheter [name], EMT Double-lumen 16 Fr. (Active)  Indication for Insertion or Continuance of Catheter Other (comment);Bladder outlet obstruction / other urologic reason 03/03/2017  8:00 AM  Site Assessment Edema;Swelling 03/02/2017  8:00 PM  Catheter Maintenance Bag below level of bladder;Catheter secured;Drainage bag/tubing not touching floor;Seal intact;No dependent loops;Insertion date on drainage bag 03/02/2017  8:00 PM  Collection Container Standard drainage bag 03/02/2017  8:00 PM  Securement Method Securing device (Describe) 03/02/2017  8:00 PM  Urinary Catheter Interventions Unclamped 03/02/2017  8:00 PM  Input (mL) 600 mL 02/21/2017  6:00 PM  Output (mL) 1200 mL 03/03/2017  6:00 AM    Microbiology/Sepsis markers: Results for orders placed or performed during the hospital encounter of 02/07/17  MRSA PCR  Screening     Status: None   Collection Time: 02/08/17  4:08 AM  Result Value Ref Range Status   MRSA by PCR NEGATIVE NEGATIVE Final    Comment:        The GeneXpert MRSA Assay (FDA approved for NASAL specimens only), is one component of a comprehensive MRSA colonization surveillance program. It is not intended to diagnose MRSA infection nor to guide or monitor treatment for MRSA infections.   Culture, respiratory (NON-Expectorated)     Status: None   Collection Time: 02/10/17 10:59 AM  Result Value Ref Range Status   Specimen Description TRACHEAL ASPIRATE  Final   Special Requests Normal  Final   Gram Stain   Final    ABUNDANT WBC PRESENT, PREDOMINANTLY PMN RARE SQUAMOUS EPITHELIAL CELLS PRESENT ABUNDANT GRAM NEGATIVE COCCI IN PAIRS ABUNDANT GRAM NEGATIVE RODS MODERATE GRAM POSITIVE COCCI IN CHAINS    Culture ABUNDANT Consistent with normal respiratory flora.  Final   Report Status 02/12/2017 FINAL  Final  Culture, blood (Routine X 2) w Reflex to ID Panel     Status: Abnormal   Collection Time: 02/11/17  3:25 PM  Result Value Ref Range Status   Specimen Description BLOOD RIGHT HAND  Final   Special Requests   Final    BOTTLES DRAWN AEROBIC ONLY Blood Culture adequate volume   Culture  Setup Time   Final    GRAM NEGATIVE RODS AEROBIC BOTTLE ONLY CRITICAL VALUE NOTED.  VALUE IS CONSISTENT WITH PREVIOUSLY REPORTED AND CALLED VALUE.    Culture (A)  Final    KLEBSIELLA OXYTOCA SUSCEPTIBILITIES PERFORMED ON PREVIOUS CULTURE WITHIN THE LAST 5 DAYS.  Report Status 02/17/2017 FINAL  Final  Culture, blood (Routine X 2) w Reflex to ID Panel     Status: Abnormal   Collection Time: 02/11/17  3:25 PM  Result Value Ref Range Status   Specimen Description BLOOD RIGHT HAND  Final   Special Requests   Final    BOTTLES DRAWN AEROBIC ONLY Blood Culture adequate volume   Culture  Setup Time   Final    GRAM NEGATIVE RODS AEROBIC BOTTLE ONLY CRITICAL RESULT CALLED TO, READ BACK BY  AND VERIFIED WITH: J.LEDFORD, PHARMD 02/15/17 0632 L.CHAMPION    Culture KLEBSIELLA OXYTOCA (A)  Final   Report Status 02/17/2017 FINAL  Final   Organism ID, Bacteria KLEBSIELLA OXYTOCA  Final      Susceptibility   Klebsiella oxytoca - MIC*    AMPICILLIN >=32 RESISTANT Resistant     CEFAZOLIN >=64 RESISTANT Resistant     CEFEPIME <=1 SENSITIVE Sensitive     CEFTAZIDIME <=1 SENSITIVE Sensitive     CEFTRIAXONE <=1 SENSITIVE Sensitive     CIPROFLOXACIN <=0.25 SENSITIVE Sensitive     GENTAMICIN <=1 SENSITIVE Sensitive     IMIPENEM <=0.25 SENSITIVE Sensitive     TRIMETH/SULFA <=20 SENSITIVE Sensitive     AMPICILLIN/SULBACTAM 8 SENSITIVE Sensitive     PIP/TAZO <=4 SENSITIVE Sensitive     Extended ESBL NEGATIVE Sensitive     * KLEBSIELLA OXYTOCA  Blood Culture ID Panel (Reflexed)     Status: Abnormal   Collection Time: 02/11/17  3:25 PM  Result Value Ref Range Status   Enterococcus species NOT DETECTED NOT DETECTED Final   Listeria monocytogenes NOT DETECTED NOT DETECTED Final   Staphylococcus species NOT DETECTED NOT DETECTED Final   Staphylococcus aureus NOT DETECTED NOT DETECTED Final   Streptococcus species NOT DETECTED NOT DETECTED Final   Streptococcus agalactiae NOT DETECTED NOT DETECTED Final   Streptococcus pneumoniae NOT DETECTED NOT DETECTED Final   Streptococcus pyogenes NOT DETECTED NOT DETECTED Final   Acinetobacter baumannii NOT DETECTED NOT DETECTED Final   Enterobacteriaceae species DETECTED (A) NOT DETECTED Final    Comment: Enterobacteriaceae represent a large family of gram-negative bacteria, not a single organism. CRITICAL RESULT CALLED TO, READ BACK BY AND VERIFIED WITH: J.LEDFORD, PHARMD 02/15/17 0632 L.CHAMPION    Enterobacter cloacae complex NOT DETECTED NOT DETECTED Final   Escherichia coli NOT DETECTED NOT DETECTED Final   Klebsiella oxytoca DETECTED (A) NOT DETECTED Final    Comment: CRITICAL RESULT CALLED TO, READ BACK BY AND VERIFIED WITH: J.LEDFORD,  PHARMD 02/15/17 0632 L.CHAMPION    Klebsiella pneumoniae NOT DETECTED NOT DETECTED Final   Proteus species NOT DETECTED NOT DETECTED Final   Serratia marcescens NOT DETECTED NOT DETECTED Final   Carbapenem resistance NOT DETECTED NOT DETECTED Final   Haemophilus influenzae NOT DETECTED NOT DETECTED Final   Neisseria meningitidis NOT DETECTED NOT DETECTED Final   Pseudomonas aeruginosa NOT DETECTED NOT DETECTED Final   Candida albicans NOT DETECTED NOT DETECTED Final   Candida glabrata NOT DETECTED NOT DETECTED Final   Candida krusei NOT DETECTED NOT DETECTED Final   Candida parapsilosis NOT DETECTED NOT DETECTED Final   Candida tropicalis NOT DETECTED NOT DETECTED Final  Surgical PCR screen     Status: None   Collection Time: 02/13/17  6:30 AM  Result Value Ref Range Status   MRSA, PCR NEGATIVE NEGATIVE Final   Staphylococcus aureus NEGATIVE NEGATIVE Final    Comment:        The Xpert SA Assay (FDA approved for NASAL  specimens in patients over 60 years of age), is one component of a comprehensive surveillance program.  Test performance has been validated by [facility] for patients greater than or equal to 45 year old. It is not intended to diagnose infection nor to guide or monitor treatment.   Culture, blood (Routine X 2) w Reflex to ID Panel     Status: None   Collection Time: 02/18/17  2:52 PM  Result Value Ref Range Status   Specimen Description BLOOD RIGHT HAND  Final   Special Requests IN PEDIATRIC BOTTLE Blood Culture adequate volume  Final   Culture NO GROWTH 5 DAYS  Final   Report Status 02/23/2017 FINAL  Final  Culture, blood (Routine X 2) w Reflex to ID Panel     Status: Abnormal   Collection Time: 02/18/17  2:55 PM  Result Value Ref Range Status   Specimen Description BLOOD RIGHT WRIST  Final   Special Requests IN PEDIATRIC BOTTLE Blood Culture adequate volume  Final   Culture  Setup Time   Final    IN PEDIATRIC BOTTLE GRAM POSITIVE RODS CRITICAL RESULT  CALLED TO, READ BACK BY AND VERIFIED WITH: L FOLTANSKI,PHARMD AT 0730 02/19/17 BY L BENFIELD    Culture (A)  Final    BACILLUS SPECIES Standardized susceptibility testing for this organism is not available. ENTEROCOCCUS DETECTED ON BCID, NOT RECOVERED IN CULTURE    Report Status 02/21/2017 FINAL  Final  Blood Culture ID Panel (Reflexed)     Status: Abnormal   Collection Time: 02/18/17  2:55 PM  Result Value Ref Range Status   Enterococcus species DETECTED (A) NOT DETECTED Final    Comment: CRITICAL RESULT CALLED TO, READ BACK BY AND VERIFIED WITH: L FOLTANSKI,PHARMD AT 0730 02/19/17 BY L BENFIELD    Vancomycin resistance NOT DETECTED NOT DETECTED Final   Listeria monocytogenes NOT DETECTED NOT DETECTED Final   Staphylococcus species NOT DETECTED NOT DETECTED Final   Staphylococcus aureus NOT DETECTED NOT DETECTED Final   Streptococcus species NOT DETECTED NOT DETECTED Final   Streptococcus agalactiae NOT DETECTED NOT DETECTED Final   Streptococcus pneumoniae NOT DETECTED NOT DETECTED Final   Streptococcus pyogenes NOT DETECTED NOT DETECTED Final   Acinetobacter baumannii NOT DETECTED NOT DETECTED Final   Enterobacteriaceae species NOT DETECTED NOT DETECTED Final   Enterobacter cloacae complex NOT DETECTED NOT DETECTED Final   Escherichia coli NOT DETECTED NOT DETECTED Final   Klebsiella oxytoca NOT DETECTED NOT DETECTED Final   Klebsiella pneumoniae NOT DETECTED NOT DETECTED Final   Proteus species NOT DETECTED NOT DETECTED Final   Serratia marcescens NOT DETECTED NOT DETECTED Final   Haemophilus influenzae NOT DETECTED NOT DETECTED Final   Neisseria meningitidis NOT DETECTED NOT DETECTED Final   Pseudomonas aeruginosa NOT DETECTED NOT DETECTED Final   Candida albicans NOT DETECTED NOT DETECTED Final   Candida glabrata NOT DETECTED NOT DETECTED Final   Candida krusei NOT DETECTED NOT DETECTED Final   Candida parapsilosis NOT DETECTED NOT DETECTED Final   Candida tropicalis NOT  DETECTED NOT DETECTED Final  Culture, blood (routine x 2)     Status: None   Collection Time: 02/20/17  4:35 AM  Result Value Ref Range Status   Specimen Description BLOOD RIGHT HAND  Final   Special Requests   Final    BOTTLES DRAWN AEROBIC AND ANAEROBIC Blood Culture adequate volume   Culture NO GROWTH 5 DAYS  Final   Report Status 02/25/2017 FINAL  Final  Culture, blood (routine x 2)  Status: None   Collection Time: 02/20/17  4:35 AM  Result Value Ref Range Status   Specimen Description BLOOD RIGHT HAND  Final   Special Requests   Final    BOTTLES DRAWN AEROBIC AND ANAEROBIC Blood Culture adequate volume   Culture NO GROWTH 5 DAYS  Final   Report Status 02/25/2017 FINAL  Final    Anti-infectives:  Anti-infectives    Start     Dose/Rate Route Frequency Ordered Stop   02/19/17 2200  vancomycin (VANCOCIN) IVPB 750 mg/150 ml premix  Status:  Discontinued     750 mg 150 mL/hr over 60 Minutes Intravenous Every 12 hours 02/19/17 0807 02/20/17 1150   02/19/17 0900  vancomycin (VANCOCIN) 2,000 mg in sodium chloride 0.9 % 500 mL IVPB     2,000 mg 250 mL/hr over 120 Minutes Intravenous  Once 02/19/17 0807 02/19/17 1151   02/17/17 1100  cefTRIAXone (ROCEPHIN) 2 g in dextrose 5 % 50 mL IVPB  Status:  Discontinued     2 g 100 mL/hr over 30 Minutes Intravenous Every 24 hours 02/17/17 1041 02/19/17 0801   02/14/17 1500  piperacillin-tazobactam (ZOSYN) IVPB 3.375 g  Status:  Discontinued     3.375 g 12.5 mL/hr over 240 Minutes Intravenous Every 8 hours 02/14/17 1351 02/17/17 1041   02/11/17 1000  piperacillin-tazobactam (ZOSYN) IVPB 3.375 g  Status:  Discontinued     3.375 g 12.5 mL/hr over 240 Minutes Intravenous Every 8 hours 02/11/17 0858 02/14/17 1351   02/11/17 1000  vancomycin (VANCOCIN) IVPB 750 mg/150 ml premix  Status:  Discontinued     750 mg 150 mL/hr over 60 Minutes Intravenous Every 12 hours 02/11/17 0858 02/14/17 0844   02/07/17 2000  ceFAZolin (ANCEF) IVPB 1 g/50 mL premix      1 g 100 mL/hr over 30 Minutes Intravenous Every 8 hours 02/07/17 1416 02/08/17 1543      Best Practice/Protocols:  VTE Prophylaxis: Lovenox (prophylaxtic dose) and Mechanical GI Prophylaxis: Proton Pump Inhibitor Intermittent Sedation  Consults: Treatment Team:  Altamese Freedom, MD    Events:  Subjective:    Overnight Issues: Hemoglobin dropped to 6.7.  Getting one unit of blood today  Objective:  Vital signs for last 24 hours: Temp:  [99 F (37.2 C)-100.2 F (37.9 C)] 100.2 F (37.9 C) (09/15 0800) Pulse Rate:  [61-97] 93 (09/15 0900) Resp:  [18-29] 29 (09/15 0900) BP: (115-139)/(62-85) 126/69 (09/15 0900) SpO2:  [91 %-100 %] 97 % (09/15 0900) FiO2 (%):  [40 %-50 %] 40 % (09/15 0825) Weight:  [95.2 kg (209 lb 14.1 oz)] 95.2 kg (209 lb 14.1 oz) (09/15 0400)  Hemodynamic parameters for last 24 hours:    Intake/Output from previous day: 09/14 0701 - 09/15 0700 In: 3180 [I.V.:1200; Blood:315; NG/GT:1560; IV Piggyback:105] Out: 3235 [Urine:3235]  Intake/Output this shift: Total I/O In: 20 [I.V.:20] Out: -   Vent settings for last 24 hours: Vent Mode: CPAP;PSV FiO2 (%):  [40 %-50 %] 40 % Set Rate:  [20 bmp] 20 bmp Vt Set:  [580 mL] 580 mL PEEP:  [5 cmH20] 5 cmH20 Pressure Support:  [12 cmH20] 12 cmH20 Plateau Pressure:  [19 cmH20-23 cmH20] 19 cmH20  Physical Exam:  General: alert and no respiratory distress Neuro: alert, nonfocal exam and RASS 0 Resp: rhonchi bilaterally CVS: regular rate and rhythm, S1, S2 normal, no murmur, click, rub or gallop GI: soft, nontender, BS WNL, no r/g and Tolerating tube feedings. Extremities: no edema, no erythema, pulses WNL  Results for  orders placed or performed during the hospital encounter of 02/07/17 (from the past 24 hour(s))  Glucose, capillary     Status: Abnormal   Collection Time: 03/03/17 11:43 AM  Result Value Ref Range   Glucose-Capillary 177 (H) 65 - 99 mg/dL  CBC     Status: Abnormal   Collection  Time: 03/03/17  2:08 PM  Result Value Ref Range   WBC 14.3 (H) 4.0 - 10.5 K/uL   RBC 2.69 (L) 4.22 - 5.81 MIL/uL   Hemoglobin 7.6 (L) 13.0 - 17.0 g/dL   HCT 23.6 (L) 39.0 - 52.0 %   MCV 87.7 78.0 - 100.0 fL   MCH 28.3 26.0 - 34.0 pg   MCHC 32.2 30.0 - 36.0 g/dL   RDW 16.3 (H) 11.5 - 15.5 %   Platelets 397 150 - 400 K/uL  Glucose, capillary     Status: Abnormal   Collection Time: 03/03/17  4:12 PM  Result Value Ref Range   Glucose-Capillary 154 (H) 65 - 99 mg/dL  Glucose, capillary     Status: Abnormal   Collection Time: 03/03/17  7:32 PM  Result Value Ref Range   Glucose-Capillary 125 (H) 65 - 99 mg/dL  Glucose, capillary     Status: Abnormal   Collection Time: 03/04/17 12:16 AM  Result Value Ref Range   Glucose-Capillary 125 (H) 65 - 99 mg/dL  Glucose, capillary     Status: Abnormal   Collection Time: 03/04/17  3:22 AM  Result Value Ref Range   Glucose-Capillary 115 (H) 65 - 99 mg/dL  CBC with Differential/Platelet     Status: Abnormal   Collection Time: 03/04/17  4:26 AM  Result Value Ref Range   WBC 12.9 (H) 4.0 - 10.5 K/uL   RBC 2.98 (L) 4.22 - 5.81 MIL/uL   Hemoglobin 8.3 (L) 13.0 - 17.0 g/dL   HCT 26.0 (L) 39.0 - 52.0 %   MCV 87.2 78.0 - 100.0 fL   MCH 27.9 26.0 - 34.0 pg   MCHC 31.9 30.0 - 36.0 g/dL   RDW 16.4 (H) 11.5 - 15.5 %   Platelets 364 150 - 400 K/uL   Neutrophils Relative % 70 %   Neutro Abs 9.1 (H) 1.7 - 7.7 K/uL   Lymphocytes Relative 9 %   Lymphs Abs 1.2 0.7 - 4.0 K/uL   Monocytes Relative 18 %   Monocytes Absolute 2.3 (H) 0.1 - 1.0 K/uL   Eosinophils Relative 3 %   Eosinophils Absolute 0.4 0.0 - 0.7 K/uL   Basophils Relative 0 %   Basophils Absolute 0.0 0.0 - 0.1 K/uL  Basic metabolic panel     Status: Abnormal   Collection Time: 03/04/17  4:26 AM  Result Value Ref Range   Sodium 136 135 - 145 mmol/L   Potassium 4.4 3.5 - 5.1 mmol/L   Chloride 108 101 - 111 mmol/L   CO2 23 22 - 32 mmol/L   Glucose, Bld 136 (H) 65 - 99 mg/dL   BUN 38 (H) 6 -  20 mg/dL   Creatinine, Ser 1.26 (H) 0.61 - 1.24 mg/dL   Calcium 7.6 (L) 8.9 - 10.3 mg/dL   GFR calc non Af Amer >60 >60 mL/min   GFR calc Af Amer >60 >60 mL/min   Anion gap 5 5 - 15  Glucose, capillary     Status: Abnormal   Collection Time: 03/04/17  8:02 AM  Result Value Ref Range   Glucose-Capillary 145 (H) 65 - 99 mg/dL  Assessment/Plan:   NEURO  Altered Mental Status:  agitation, delirium and sedation   Plan: Medications adjusted to tolerate weaning.  PULM  Lots of secretions continue today; no leak from tach.   Plan: Tolerating trach collar.  CARDIO  No specific issues   Plan: CPM  RENAL  Actue Kidney Injury (due to hypovolemia/decreased circulating volume) - resolving; Cr 1.26 today; adequate uop although with 3 I&O caths overnight for high bladder volumes   Plan: Improving, continue to trend labs; replace Foley today  GI  No issues   Plan: Continue tube feedings  ID  We are not currently treating any infectious problems.   Plan: CPM  HEME  Anemia acute blood loss anemia and anemia of critical illness)   Plan: Getting one unit of blood.  ENDO No specific issues   Plan: CPM  Global Issues  Continue trach collar; replace foley catheter    LOS: 25 days   Additional comments:I reviewed the patient's new clinical lab test results. cbc/bmet  Critical Care Total Time*: 9 Minutes  Ileana Roup 03/04/2017  *Care during the described time interval was provided by me and/or other providers on the critical care team.  I have reviewed this patient's available data, including medical history, events of note, physical examination and test results as part of my evaluation.

## 2017-03-05 LAB — GLUCOSE, CAPILLARY
GLUCOSE-CAPILLARY: 142 mg/dL — AB (ref 65–99)
GLUCOSE-CAPILLARY: 157 mg/dL — AB (ref 65–99)
GLUCOSE-CAPILLARY: 152 mg/dL — AB (ref 65–99)
GLUCOSE-CAPILLARY: 155 mg/dL — AB (ref 65–99)
GLUCOSE-CAPILLARY: 132 mg/dL — AB (ref 65–99)
GLUCOSE-CAPILLARY: 149 mg/dL — AB (ref 65–99)
Glucose-Capillary: 152 mg/dL — ABNORMAL HIGH (ref 65–99)

## 2017-03-05 LAB — CBC
HCT: 22.8 % — ABNORMAL LOW (ref 39.0–52.0)
HEMOGLOBIN: 7.2 g/dL — AB (ref 13.0–17.0)
MCH: 27.9 pg (ref 26.0–34.0)
MCHC: 31.6 g/dL (ref 30.0–36.0)
MCV: 88.4 fL (ref 78.0–100.0)
Platelets: 380 10*3/uL (ref 150–400)
RBC: 2.58 MIL/uL — AB (ref 4.22–5.81)
RDW: 15.8 % — ABNORMAL HIGH (ref 11.5–15.5)
WBC: 11.9 10*3/uL — AB (ref 4.0–10.5)

## 2017-03-05 LAB — BASIC METABOLIC PANEL
ANION GAP: 5 (ref 5–15)
BUN: 39 mg/dL — ABNORMAL HIGH (ref 6–20)
CHLORIDE: 106 mmol/L (ref 101–111)
CO2: 25 mmol/L (ref 22–32)
CREATININE: 1.07 mg/dL (ref 0.61–1.24)
Calcium: 7.8 mg/dL — ABNORMAL LOW (ref 8.9–10.3)
GFR calc Af Amer: >60 mL/min (ref >60)
GFR calc non Af Amer: >60 mL/min (ref >60)
Glucose, Bld: 170 mg/dL — ABNORMAL HIGH (ref 65–99)
POTASSIUM: 4.8 mmol/L (ref 3.5–5.1)
SODIUM: 136 mmol/L (ref 135–145)

## 2017-03-05 NOTE — Progress Notes (Signed)
Patient ID: Peter [name], male   DOB: February 28, 1961, 56 y.o.   MRN: [id number] Follow up - Trauma Critical Care  Patient Details:    Peter [name] is an 56 y.o. male.  Lines/tubes : PICC Double Lumen 02/23/17 PICC Right Cephalic 42 cm 0 cm (Active)  Indication for Insertion or Continuance of Line Prolonged intravenous therapies 03/05/2017  8:00 AM  Exposed Catheter (cm) 0 cm 02/23/2017 11:00 AM  Site Assessment Clean;Dry;Intact 03/05/2017  8:00 AM  Lumen #1 Status In-line blood sampling system in place 03/05/2017  8:00 AM  Lumen #2 Status Infusing 03/05/2017  8:00 AM  Dressing Type Transparent;Occlusive 03/05/2017  8:00 AM  Dressing Status Clean;Dry;Intact;Antimicrobial disc in place 03/05/2017  8:00 AM  Line Care Connections checked and tightened 03/05/2017  8:00 AM  Dressing Intervention Dressing changed 03/03/2017  8:00 AM  Dressing Change Due 03/10/17 03/05/2017  8:00 AM     Gastrostomy/Enterostomy PEG-jejunostomy (Active)  Surrounding Skin Intact;Dry 03/04/2017  8:00 PM  Tube Status Patent 03/05/2017  8:00 AM  Dressing Status Clean;Dry;Intact 03/05/2017  8:00 AM  Dressing Intervention Dressing changed 03/03/2017  8:00 AM  Dressing Type Split gauze;Abdominal Binder 03/03/2017  8:00 AM  G Port Intake (mL) 200 ml 03/04/2017 10:00 PM     Urethral Catheter [name] Non-latex 16 Fr. (Active)  Indication for Insertion or Continuance of Catheter Acute urinary retention 03/05/2017  8:00 AM  Site Assessment Clean;Intact 03/04/2017  8:00 PM  Catheter Maintenance Bag below level of bladder;Drainage bag/tubing not touching floor;Catheter secured;Insertion date on drainage bag;No dependent loops;Seal intact 03/05/2017  8:00 AM  Collection Container Standard drainage bag 03/05/2017  8:00 AM  Securement Method Securing device (Describe) 03/05/2017  8:00 AM  Output (mL) 160 mL 03/05/2017 10:00 AM    Microbiology/Sepsis markers: Results for orders placed or performed during the hospital encounter of  02/07/17  MRSA PCR Screening     Status: None   Collection Time: 02/08/17  4:08 AM  Result Value Ref Range Status   MRSA by PCR NEGATIVE NEGATIVE Final    Comment:        The GeneXpert MRSA Assay (FDA approved for NASAL specimens only), is one component of a comprehensive MRSA colonization surveillance program. It is not intended to diagnose MRSA infection nor to guide or monitor treatment for MRSA infections.   Culture, respiratory (NON-Expectorated)     Status: None   Collection Time: 02/10/17 10:59 AM  Result Value Ref Range Status   Specimen Description TRACHEAL ASPIRATE  Final   Special Requests Normal  Final   Gram Stain   Final    ABUNDANT WBC PRESENT, PREDOMINANTLY PMN RARE SQUAMOUS EPITHELIAL CELLS PRESENT ABUNDANT GRAM NEGATIVE COCCI IN PAIRS ABUNDANT GRAM NEGATIVE RODS MODERATE GRAM POSITIVE COCCI IN CHAINS    Culture ABUNDANT Consistent with normal respiratory flora.  Final   Report Status 02/12/2017 FINAL  Final  Culture, blood (Routine X 2) w Reflex to ID Panel     Status: Abnormal   Collection Time: 02/11/17  3:25 PM  Result Value Ref Range Status   Specimen Description BLOOD RIGHT HAND  Final   Special Requests   Final    BOTTLES DRAWN AEROBIC ONLY Blood Culture adequate volume   Culture  Setup Time   Final    GRAM NEGATIVE RODS AEROBIC BOTTLE ONLY CRITICAL VALUE NOTED.  VALUE IS CONSISTENT WITH PREVIOUSLY REPORTED AND CALLED VALUE.    Culture (A)  Final    KLEBSIELLA OXYTOCA SUSCEPTIBILITIES PERFORMED ON PREVIOUS CULTURE  WITHIN THE LAST 5 DAYS.    Report Status 02/17/2017 FINAL  Final  Culture, blood (Routine X 2) w Reflex to ID Panel     Status: Abnormal   Collection Time: 02/11/17  3:25 PM  Result Value Ref Range Status   Specimen Description BLOOD RIGHT HAND  Final   Special Requests   Final    BOTTLES DRAWN AEROBIC ONLY Blood Culture adequate volume   Culture  Setup Time   Final    GRAM NEGATIVE RODS AEROBIC BOTTLE ONLY CRITICAL RESULT  CALLED TO, READ BACK BY AND VERIFIED WITH: J.LEDFORD, PHARMD 02/15/17 0632 L.CHAMPION    Culture KLEBSIELLA OXYTOCA (A)  Final   Report Status 02/17/2017 FINAL  Final   Organism ID, Bacteria KLEBSIELLA OXYTOCA  Final      Susceptibility   Klebsiella oxytoca - MIC*    AMPICILLIN >=32 RESISTANT Resistant     CEFAZOLIN >=64 RESISTANT Resistant     CEFEPIME <=1 SENSITIVE Sensitive     CEFTAZIDIME <=1 SENSITIVE Sensitive     CEFTRIAXONE <=1 SENSITIVE Sensitive     CIPROFLOXACIN <=0.25 SENSITIVE Sensitive     GENTAMICIN <=1 SENSITIVE Sensitive     IMIPENEM <=0.25 SENSITIVE Sensitive     TRIMETH/SULFA <=20 SENSITIVE Sensitive     AMPICILLIN/SULBACTAM 8 SENSITIVE Sensitive     PIP/TAZO <=4 SENSITIVE Sensitive     Extended ESBL NEGATIVE Sensitive     * KLEBSIELLA OXYTOCA  Blood Culture ID Panel (Reflexed)     Status: Abnormal   Collection Time: 02/11/17  3:25 PM  Result Value Ref Range Status   Enterococcus species NOT DETECTED NOT DETECTED Final   Listeria monocytogenes NOT DETECTED NOT DETECTED Final   Staphylococcus species NOT DETECTED NOT DETECTED Final   Staphylococcus aureus NOT DETECTED NOT DETECTED Final   Streptococcus species NOT DETECTED NOT DETECTED Final   Streptococcus agalactiae NOT DETECTED NOT DETECTED Final   Streptococcus pneumoniae NOT DETECTED NOT DETECTED Final   Streptococcus pyogenes NOT DETECTED NOT DETECTED Final   Acinetobacter baumannii NOT DETECTED NOT DETECTED Final   Enterobacteriaceae species DETECTED (A) NOT DETECTED Final    Comment: Enterobacteriaceae represent a large family of gram-negative bacteria, not a single organism. CRITICAL RESULT CALLED TO, READ BACK BY AND VERIFIED WITH: J.LEDFORD, PHARMD 02/15/17 0632 L.CHAMPION    Enterobacter cloacae complex NOT DETECTED NOT DETECTED Final   Escherichia coli NOT DETECTED NOT DETECTED Final   Klebsiella oxytoca DETECTED (A) NOT DETECTED Final    Comment: CRITICAL RESULT CALLED TO, READ BACK BY AND  VERIFIED WITH: J.LEDFORD, PHARMD 02/15/17 0632 L.CHAMPION    Klebsiella pneumoniae NOT DETECTED NOT DETECTED Final   Proteus species NOT DETECTED NOT DETECTED Final   Serratia marcescens NOT DETECTED NOT DETECTED Final   Carbapenem resistance NOT DETECTED NOT DETECTED Final   Haemophilus influenzae NOT DETECTED NOT DETECTED Final   Neisseria meningitidis NOT DETECTED NOT DETECTED Final   Pseudomonas aeruginosa NOT DETECTED NOT DETECTED Final   Candida albicans NOT DETECTED NOT DETECTED Final   Candida glabrata NOT DETECTED NOT DETECTED Final   Candida krusei NOT DETECTED NOT DETECTED Final   Candida parapsilosis NOT DETECTED NOT DETECTED Final   Candida tropicalis NOT DETECTED NOT DETECTED Final  Surgical PCR screen     Status: None   Collection Time: 02/13/17  6:30 AM  Result Value Ref Range Status   MRSA, PCR NEGATIVE NEGATIVE Final   Staphylococcus aureus NEGATIVE NEGATIVE Final    Comment:  The Xpert SA Assay (FDA approved for NASAL specimens in patients over 43 years of age), is one component of a comprehensive surveillance program.  Test performance has been validated by [facility] for patients greater than or equal to 21 year old. It is not intended to diagnose infection nor to guide or monitor treatment.   Culture, blood (Routine X 2) w Reflex to ID Panel     Status: None   Collection Time: 02/18/17  2:52 PM  Result Value Ref Range Status   Specimen Description BLOOD RIGHT HAND  Final   Special Requests IN PEDIATRIC BOTTLE Blood Culture adequate volume  Final   Culture NO GROWTH 5 DAYS  Final   Report Status 02/23/2017 FINAL  Final  Culture, blood (Routine X 2) w Reflex to ID Panel     Status: Abnormal   Collection Time: 02/18/17  2:55 PM  Result Value Ref Range Status   Specimen Description BLOOD RIGHT WRIST  Final   Special Requests IN PEDIATRIC BOTTLE Blood Culture adequate volume  Final   Culture  Setup Time   Final    IN PEDIATRIC BOTTLE GRAM POSITIVE  RODS CRITICAL RESULT CALLED TO, READ BACK BY AND VERIFIED WITH: L FOLTANSKI,PHARMD AT 0730 02/19/17 BY L BENFIELD    Culture (A)  Final    BACILLUS SPECIES Standardized susceptibility testing for this organism is not available. ENTEROCOCCUS DETECTED ON BCID, NOT RECOVERED IN CULTURE    Report Status 02/21/2017 FINAL  Final  Blood Culture ID Panel (Reflexed)     Status: Abnormal   Collection Time: 02/18/17  2:55 PM  Result Value Ref Range Status   Enterococcus species DETECTED (A) NOT DETECTED Final    Comment: CRITICAL RESULT CALLED TO, READ BACK BY AND VERIFIED WITH: L FOLTANSKI,PHARMD AT 0730 02/19/17 BY L BENFIELD    Vancomycin resistance NOT DETECTED NOT DETECTED Final   Listeria monocytogenes NOT DETECTED NOT DETECTED Final   Staphylococcus species NOT DETECTED NOT DETECTED Final   Staphylococcus aureus NOT DETECTED NOT DETECTED Final   Streptococcus species NOT DETECTED NOT DETECTED Final   Streptococcus agalactiae NOT DETECTED NOT DETECTED Final   Streptococcus pneumoniae NOT DETECTED NOT DETECTED Final   Streptococcus pyogenes NOT DETECTED NOT DETECTED Final   Acinetobacter baumannii NOT DETECTED NOT DETECTED Final   Enterobacteriaceae species NOT DETECTED NOT DETECTED Final   Enterobacter cloacae complex NOT DETECTED NOT DETECTED Final   Escherichia coli NOT DETECTED NOT DETECTED Final   Klebsiella oxytoca NOT DETECTED NOT DETECTED Final   Klebsiella pneumoniae NOT DETECTED NOT DETECTED Final   Proteus species NOT DETECTED NOT DETECTED Final   Serratia marcescens NOT DETECTED NOT DETECTED Final   Haemophilus influenzae NOT DETECTED NOT DETECTED Final   Neisseria meningitidis NOT DETECTED NOT DETECTED Final   Pseudomonas aeruginosa NOT DETECTED NOT DETECTED Final   Candida albicans NOT DETECTED NOT DETECTED Final   Candida glabrata NOT DETECTED NOT DETECTED Final   Candida krusei NOT DETECTED NOT DETECTED Final   Candida parapsilosis NOT DETECTED NOT DETECTED Final    Candida tropicalis NOT DETECTED NOT DETECTED Final  Culture, blood (routine x 2)     Status: None   Collection Time: 02/20/17  4:35 AM  Result Value Ref Range Status   Specimen Description BLOOD RIGHT HAND  Final   Special Requests   Final    BOTTLES DRAWN AEROBIC AND ANAEROBIC Blood Culture adequate volume   Culture NO GROWTH 5 DAYS  Final   Report Status 02/25/2017 FINAL  Final  Culture, blood (routine x 2)     Status: None   Collection Time: 02/20/17  4:35 AM  Result Value Ref Range Status   Specimen Description BLOOD RIGHT HAND  Final   Special Requests   Final    BOTTLES DRAWN AEROBIC AND ANAEROBIC Blood Culture adequate volume   Culture NO GROWTH 5 DAYS  Final   Report Status 02/25/2017 FINAL  Final    Anti-infectives:  Anti-infectives    Start     Dose/Rate Route Frequency Ordered Stop   02/19/17 2200  vancomycin (VANCOCIN) IVPB 750 mg/150 ml premix  Status:  Discontinued     750 mg 150 mL/hr over 60 Minutes Intravenous Every 12 hours 02/19/17 0807 02/20/17 1150   02/19/17 0900  vancomycin (VANCOCIN) 2,000 mg in sodium chloride 0.9 % 500 mL IVPB     2,000 mg 250 mL/hr over 120 Minutes Intravenous  Once 02/19/17 0807 02/19/17 1151   02/17/17 1100  cefTRIAXone (ROCEPHIN) 2 g in dextrose 5 % 50 mL IVPB  Status:  Discontinued     2 g 100 mL/hr over 30 Minutes Intravenous Every 24 hours 02/17/17 1041 02/19/17 0801   02/14/17 1500  piperacillin-tazobactam (ZOSYN) IVPB 3.375 g  Status:  Discontinued     3.375 g 12.5 mL/hr over 240 Minutes Intravenous Every 8 hours 02/14/17 1351 02/17/17 1041   02/11/17 1000  piperacillin-tazobactam (ZOSYN) IVPB 3.375 g  Status:  Discontinued     3.375 g 12.5 mL/hr over 240 Minutes Intravenous Every 8 hours 02/11/17 0858 02/14/17 1351   02/11/17 1000  vancomycin (VANCOCIN) IVPB 750 mg/150 ml premix  Status:  Discontinued     750 mg 150 mL/hr over 60 Minutes Intravenous Every 12 hours 02/11/17 0858 02/14/17 0844   02/07/17 2000  ceFAZolin  (ANCEF) IVPB 1 g/50 mL premix     1 g 100 mL/hr over 30 Minutes Intravenous Every 8 hours 02/07/17 1416 02/08/17 1543      Best Practice/Protocols:  VTE Prophylaxis: Mechanical GI Prophylaxis: Proton Pump Inhibitor Intermittent Sedation  Consults: Treatment Team:  Altamese Minto, MD    Studies:    Events:  Subjective:    Overnight Issues:   Objective:  Vital signs for last 24 hours: Temp:  [97.9 F (36.6 C)-99.5 F (37.5 C)] 97.9 F (36.6 C) (09/16 0750) Pulse Rate:  [74-103] 75 (09/16 1000) Resp:  [16-36] 21 (09/16 1000) BP: (99-142)/(66-83) 125/72 (09/16 1000) SpO2:  [98 %-100 %] 100 % (09/16 1000) FiO2 (%):  [40 %] 40 % (09/16 1000) Weight:  [97.9 kg (215 lb 13.3 oz)] 97.9 kg (215 lb 13.3 oz) (09/16 0500)  Hemodynamic parameters for last 24 hours:    Intake/Output from previous day: 09/15 0701 - 09/16 0700 In: 3780 [I.V.:1220; NG/GT:2360] Out: 2830 [Urine:2830]  Intake/Output this shift: Total I/O In: 345 [I.V.:150; NG/GT:195] Out: 320 [Urine:320]  Vent settings for last 24 hours: Vent Mode: PRVC FiO2 (%):  [40 %] 40 % Set Rate:  [20 bmp] 20 bmp Vt Set:  [580 mL] 580 mL PEEP:  [5 cmH20] 5 cmH20 Pressure Support:  [8 cmH20] 8 cmH20 Plateau Pressure:  [14 RCV89-38 cmH20] 18 cmH20  Physical Exam:  General: on vent Neuro: awake and F/C some HEENT/Neck: trach-clean, intact Resp: clear to auscultation bilaterally CVS: RRR GI: soft, NT, PEG in place Extremities: no signif edema except RUE, no erythema, pulses WNL   Results for orders placed or performed during the hospital encounter of 02/07/17 (from the past 24 hour(s))  Glucose, capillary     Status: Abnormal   Collection Time: 03/04/17 11:51 AM  Result Value Ref Range   Glucose-Capillary 159 (H) 65 - 99 mg/dL  Glucose, capillary     Status: Abnormal   Collection Time: 03/04/17  3:58 PM  Result Value Ref Range   Glucose-Capillary 149 (H) 65 - 99 mg/dL  Glucose, capillary     Status: Abnormal    Collection Time: 03/04/17  7:36 PM  Result Value Ref Range   Glucose-Capillary 126 (H) 65 - 99 mg/dL  Glucose, capillary     Status: Abnormal   Collection Time: 03/05/17 12:03 AM  Result Value Ref Range   Glucose-Capillary 152 (H) 65 - 99 mg/dL  Glucose, capillary     Status: Abnormal   Collection Time: 03/05/17  3:55 AM  Result Value Ref Range   Glucose-Capillary 142 (H) 65 - 99 mg/dL  CBC     Status: Abnormal   Collection Time: 03/05/17  6:13 AM  Result Value Ref Range   WBC 11.9 (H) 4.0 - 10.5 K/uL   RBC 2.58 (L) 4.22 - 5.81 MIL/uL   Hemoglobin 7.2 (L) 13.0 - 17.0 g/dL   HCT 22.8 (L) 39.0 - 52.0 %   MCV 88.4 78.0 - 100.0 fL   MCH 27.9 26.0 - 34.0 pg   MCHC 31.6 30.0 - 36.0 g/dL   RDW 15.8 (H) 11.5 - 15.5 %   Platelets 380 150 - 400 K/uL  Basic metabolic panel     Status: Abnormal   Collection Time: 03/05/17  6:13 AM  Result Value Ref Range   Sodium 136 135 - 145 mmol/L   Potassium 4.8 3.5 - 5.1 mmol/L   Chloride 106 101 - 111 mmol/L   CO2 25 22 - 32 mmol/L   Glucose, Bld 170 (H) 65 - 99 mg/dL   BUN 39 (H) 6 - 20 mg/dL   Creatinine, Ser 1.07 0.61 - 1.24 mg/dL   Calcium 7.8 (L) 8.9 - 10.3 mg/dL   GFR calc non Af Amer >60 >60 mL/min   GFR calc Af Amer >60 >60 mL/min   Anion gap 5 5 - 15  Glucose, capillary     Status: Abnormal   Collection Time: 03/05/17  7:08 AM  Result Value Ref Range   Glucose-Capillary 157 (H) 65 - 99 mg/dL    Assessment & Plan: Present on Admission: . Multiple fractures of ribs, bilateral, initial encounter for closed fracture . 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 Marcelino Scot  8/21 , S/P symphysis plating by Dr. Marcelino Scot 8/27,  - NWB BLE for 8 weeks, no ROM restrictions  Vent dependent acute hypoxic resp failure- weaningas tolerated-on PS , continue Klon/Sero. Hopefully can get to trach collar soon. FEN- hyperkalemia resolved; tolerating TF, Last BM 9/16 IDDM- lantus and SSI R heel deep tissue injury pressure sore - per WOC Renal - AKI stable, Cr now normal VTE- PAS, Lovenox held as above - possible restart Monday Elevated LFTs- direct hyperbilirubinemia from resorbtion of hematomas; repeat CMET on monday Dispo- ICU; monitor RUE edema - may need duplex  LOS: 26 days   Additional comments:I reviewed the patient's new clinical lab test results.  and I have discussed and reviewed with family members patient's sister  Critical Care Total Time*: [place]. Redmond Pulling, MD, FACS General, Bariatric, & Minimally Invasive Surgery [facility], [place]   03/05/2017  *Care during the described time interval was provided by me. I have reviewed this patient's available data, including medical history, events of note, physical examination and test results as part of my evaluation.

## 2017-03-05 NOTE — Progress Notes (Signed)
RT note-Patient placed back to wean on cpap/ps, tolerating well at this time. Continues to leak around the trach, volumes are good. MD is aware.

## 2017-03-05 NOTE — Progress Notes (Signed)
RT note- Placed to ATC 40%, continue to monitor.

## 2017-03-06 ENCOUNTER — Inpatient Hospital Stay (HOSPITAL_COMMUNITY): Payer: BLUE CROSS/BLUE SHIELD

## 2017-03-06 ENCOUNTER — Encounter (HOSPITAL_COMMUNITY): Payer: Self-pay | Admitting: Radiology

## 2017-03-06 LAB — GLUCOSE, CAPILLARY
GLUCOSE-CAPILLARY: 152 mg/dL — AB (ref 65–99)
GLUCOSE-CAPILLARY: 162 mg/dL — AB (ref 65–99)
Glucose-Capillary: 132 mg/dL — ABNORMAL HIGH (ref 65–99)
Glucose-Capillary: 107 mg/dL — ABNORMAL HIGH (ref 65–99)
Glucose-Capillary: 130 mg/dL — ABNORMAL HIGH (ref 65–99)
Glucose-Capillary: 126 mg/dL — ABNORMAL HIGH (ref 65–99)

## 2017-03-06 LAB — PREPARE RBC (CROSSMATCH)

## 2017-03-06 LAB — CBC
HCT: 21.6 % — ABNORMAL LOW (ref 39.0–52.0)
HEMOGLOBIN: 7 g/dL — AB (ref 13.0–17.0)
MCH: 28.8 pg (ref 26.0–34.0)
MCHC: 32.4 g/dL (ref 30.0–36.0)
MCV: 88.9 fL (ref 78.0–100.0)
PLATELETS: 426 10*3/uL — AB (ref 150–400)
RBC: 2.43 MIL/uL — AB (ref 4.22–5.81)
RDW: 15.4 % (ref 11.5–15.5)
WBC: 12.8 10*3/uL — AB (ref 4.0–10.5)

## 2017-03-06 LAB — COMPREHENSIVE METABOLIC PANEL
ALT: 61 U/L (ref 17–63)
ANION GAP: 4 — AB (ref 5–15)
AST: 38 U/L (ref 15–41)
Albumin: 1.4 g/dL — ABNORMAL LOW (ref 3.5–5.0)
Alkaline Phosphatase: 1009 U/L — ABNORMAL HIGH (ref 38–126)
BUN: 37 mg/dL — ABNORMAL HIGH (ref 6–20)
CHLORIDE: 107 mmol/L (ref 101–111)
CO2: 25 mmol/L (ref 22–32)
CREATININE: 0.95 mg/dL (ref 0.61–1.24)
Calcium: 7.8 mg/dL — ABNORMAL LOW (ref 8.9–10.3)
Glucose, Bld: 133 mg/dL — ABNORMAL HIGH (ref 65–99)
POTASSIUM: 4.7 mmol/L (ref 3.5–5.1)
SODIUM: 136 mmol/L (ref 135–145)
Total Bilirubin: 3.7 mg/dL — ABNORMAL HIGH (ref 0.3–1.2)
Total Protein: 5.7 g/dL — ABNORMAL LOW (ref 6.5–8.1)

## 2017-03-06 LAB — PREALBUMIN: Prealbumin: 9.9 mg/dL — ABNORMAL LOW (ref 18–38)

## 2017-03-06 MED ORDER — SODIUM CHLORIDE 0.9 % IV SOLN
Freq: Once | INTRAVENOUS | Status: AC
  Administered 2017-03-06: 12:00:00 via INTRAVENOUS

## 2017-03-06 MED ORDER — CEFAZOLIN SODIUM-DEXTROSE 1-4 GM/50ML-% IV SOLN
1.0000 g | Freq: Three times a day (TID) | INTRAVENOUS | Status: AC
  Administered 2017-03-06 – 2017-03-09 (×9): 1 g via INTRAVENOUS
  Filled 2017-03-06 (×9): qty 50

## 2017-03-06 MED ORDER — IOPAMIDOL (ISOVUE-300) INJECTION 61%
INTRAVENOUS | Status: AC
  Administered 2017-03-06: 100 mL
  Filled 2017-03-06: qty 100

## 2017-03-06 MED ORDER — HYDROCODONE-ACETAMINOPHEN 7.5-325 MG/15ML PO SOLN
10.0000 mL | ORAL | Status: DC | PRN
  Administered 2017-03-07 – 2017-03-17 (×2): 10 mL via ORAL
  Filled 2017-03-06 (×2): qty 15

## 2017-03-06 NOTE — Progress Notes (Signed)
SLP Cancellation Note  Patient Details Name: Peter [name] MRN: [id number] DOB: 1961/05/23   Cancelled treatment:        Pt having issues with his PEG and trach. Will continue efforts   [place] Siren 03/06/2017, 1:23 PM  Orbie Pyo Colvin Caroli.Ed [employer] [phone]

## 2017-03-06 NOTE — Progress Notes (Signed)
OT Cancellation Note  Patient Details Name: Peter [name] MRN: [id number] DOB: 1960/12/28   Cancelled Treatment:    Reason Eval/Treat Not Completed: Medical issues which prohibited therapy. Will follow up as time allows.  Binnie Kand M.S., OTR/L Pager: [phone]  03/06/2017, 10:38 AM

## 2017-03-06 NOTE — Progress Notes (Signed)
Follow up - Trauma and Critical Care  Patient Details:    Peter [name] is an 56 y.o. male.  Lines/tubes : PICC Double Lumen 02/23/17 PICC Right Cephalic 42 cm 0 cm (Active)  Indication for Insertion or Continuance of Line Prolonged intravenous therapies 03/05/2017  8:00 PM  Exposed Catheter (cm) 0 cm 02/23/2017 11:00 AM  Site Assessment Clean;Dry;Intact 03/05/2017  8:00 PM  Lumen #1 Status In-line blood sampling system in place;Infusing 03/05/2017  8:00 PM  Lumen #2 Status Infusing 03/05/2017  8:00 PM  Dressing Type Transparent;Occlusive 03/05/2017  8:00 PM  Dressing Status Clean;Dry;Intact;Antimicrobial disc in place 03/05/2017  8:00 PM  Line Care Connections checked and tightened 03/05/2017  8:00 PM  Dressing Intervention Dressing changed 03/03/2017  8:00 AM  Dressing Change Due 03/10/17 03/05/2017  8:00 AM     Gastrostomy/Enterostomy PEG-jejunostomy (Active)  Surrounding Skin Dry;Intact 03/05/2017  8:00 PM  Tube Status Patent 03/05/2017  8:00 PM  Drainage Appearance Purulent 03/05/2017  4:00 PM  Dressing Status Dry;Intact 03/05/2017  8:00 PM  Dressing Intervention Dressing changed 03/05/2017  4:00 PM  Dressing Type Abdominal Binder;Split gauze 03/05/2017  4:00 PM  G Port Intake (mL) 200 ml 03/04/2017 10:00 PM     Urethral Catheter Peter [name] Non-latex 16 Fr. (Active)  Indication for Insertion or Continuance of Catheter Acute urinary retention 03/05/2017  8:00 PM  Site Assessment Clean;Intact 03/05/2017  8:00 PM  Catheter Maintenance Bag below level of bladder;Catheter secured;Drainage bag/tubing not touching floor;Insertion date on drainage bag;No dependent loops;Seal intact 03/05/2017  8:00 PM  Collection Container Standard drainage bag 03/05/2017  8:00 PM  Securement Method Leg strap 03/05/2017  8:00 PM  Output (mL) 575 mL 03/06/2017  5:41 AM    Microbiology/Sepsis markers: Results for orders placed or performed during the hospital encounter of 02/07/17  MRSA PCR Screening     Status:  None   Collection Time: 02/08/17  4:08 AM  Result Value Ref Range Status   MRSA by PCR NEGATIVE NEGATIVE Final    Comment:        The GeneXpert MRSA Assay (FDA approved for NASAL specimens only), is one component of a comprehensive MRSA colonization surveillance program. It is not intended to diagnose MRSA infection nor to guide or monitor treatment for MRSA infections.   Culture, respiratory (NON-Expectorated)     Status: None   Collection Time: 02/10/17 10:59 AM  Result Value Ref Range Status   Specimen Description TRACHEAL ASPIRATE  Final   Special Requests Normal  Final   Gram Stain   Final    ABUNDANT WBC PRESENT, PREDOMINANTLY PMN RARE SQUAMOUS EPITHELIAL CELLS PRESENT ABUNDANT GRAM NEGATIVE COCCI IN PAIRS ABUNDANT GRAM NEGATIVE RODS MODERATE GRAM POSITIVE COCCI IN CHAINS    Culture ABUNDANT Consistent with normal respiratory flora.  Final   Report Status 02/12/2017 FINAL  Final  Culture, blood (Routine X 2) w Reflex to ID Panel     Status: Abnormal   Collection Time: 02/11/17  3:25 PM  Result Value Ref Range Status   Specimen Description BLOOD RIGHT HAND  Final   Special Requests   Final    BOTTLES DRAWN AEROBIC ONLY Blood Culture adequate volume   Culture  Setup Time   Final    GRAM NEGATIVE RODS AEROBIC BOTTLE ONLY CRITICAL VALUE NOTED.  VALUE IS CONSISTENT WITH PREVIOUSLY REPORTED AND CALLED VALUE.    Culture (A)  Final    KLEBSIELLA OXYTOCA SUSCEPTIBILITIES PERFORMED ON PREVIOUS CULTURE WITHIN THE LAST 5 DAYS.  Report Status 02/17/2017 FINAL  Final  Culture, blood (Routine X 2) w Reflex to ID Panel     Status: Abnormal   Collection Time: 02/11/17  3:25 PM  Result Value Ref Range Status   Specimen Description BLOOD RIGHT HAND  Final   Special Requests   Final    BOTTLES DRAWN AEROBIC ONLY Blood Culture adequate volume   Culture  Setup Time   Final    GRAM NEGATIVE RODS AEROBIC BOTTLE ONLY CRITICAL RESULT CALLED TO, READ BACK BY AND VERIFIED WITH:  J.LEDFORD, PHARMD 02/15/17 0632 L.CHAMPION    Culture KLEBSIELLA OXYTOCA (A)  Final   Report Status 02/17/2017 FINAL  Final   Organism ID, Bacteria KLEBSIELLA OXYTOCA  Final      Susceptibility   Klebsiella oxytoca - MIC*    AMPICILLIN >=32 RESISTANT Resistant     CEFAZOLIN >=64 RESISTANT Resistant     CEFEPIME <=1 SENSITIVE Sensitive     CEFTAZIDIME <=1 SENSITIVE Sensitive     CEFTRIAXONE <=1 SENSITIVE Sensitive     CIPROFLOXACIN <=0.25 SENSITIVE Sensitive     GENTAMICIN <=1 SENSITIVE Sensitive     IMIPENEM <=0.25 SENSITIVE Sensitive     TRIMETH/SULFA <=20 SENSITIVE Sensitive     AMPICILLIN/SULBACTAM 8 SENSITIVE Sensitive     PIP/TAZO <=4 SENSITIVE Sensitive     Extended ESBL NEGATIVE Sensitive     * KLEBSIELLA OXYTOCA  Blood Culture ID Panel (Reflexed)     Status: Abnormal   Collection Time: 02/11/17  3:25 PM  Result Value Ref Range Status   Enterococcus species NOT DETECTED NOT DETECTED Final   Listeria monocytogenes NOT DETECTED NOT DETECTED Final   Staphylococcus species NOT DETECTED NOT DETECTED Final   Staphylococcus aureus NOT DETECTED NOT DETECTED Final   Streptococcus species NOT DETECTED NOT DETECTED Final   Streptococcus agalactiae NOT DETECTED NOT DETECTED Final   Streptococcus pneumoniae NOT DETECTED NOT DETECTED Final   Streptococcus pyogenes NOT DETECTED NOT DETECTED Final   Acinetobacter baumannii NOT DETECTED NOT DETECTED Final   Enterobacteriaceae species DETECTED (A) NOT DETECTED Final    Comment: Enterobacteriaceae represent a large family of gram-negative bacteria, not a single organism. CRITICAL RESULT CALLED TO, READ BACK BY AND VERIFIED WITH: J.LEDFORD, PHARMD 02/15/17 0632 L.CHAMPION    Enterobacter cloacae complex NOT DETECTED NOT DETECTED Final   Escherichia coli NOT DETECTED NOT DETECTED Final   Klebsiella oxytoca DETECTED (A) NOT DETECTED Final    Comment: CRITICAL RESULT CALLED TO, READ BACK BY AND VERIFIED WITH: J.LEDFORD, PHARMD 02/15/17 0632  L.CHAMPION    Klebsiella pneumoniae NOT DETECTED NOT DETECTED Final   Proteus species NOT DETECTED NOT DETECTED Final   Serratia marcescens NOT DETECTED NOT DETECTED Final   Carbapenem resistance NOT DETECTED NOT DETECTED Final   Haemophilus influenzae NOT DETECTED NOT DETECTED Final   Neisseria meningitidis NOT DETECTED NOT DETECTED Final   Pseudomonas aeruginosa NOT DETECTED NOT DETECTED Final   Candida albicans NOT DETECTED NOT DETECTED Final   Candida glabrata NOT DETECTED NOT DETECTED Final   Candida krusei NOT DETECTED NOT DETECTED Final   Candida parapsilosis NOT DETECTED NOT DETECTED Final   Candida tropicalis NOT DETECTED NOT DETECTED Final  Surgical PCR screen     Status: None   Collection Time: 02/13/17  6:30 AM  Result Value Ref Range Status   MRSA, PCR NEGATIVE NEGATIVE Final   Staphylococcus aureus NEGATIVE NEGATIVE Final    Comment:        The Xpert SA Assay (FDA approved for NASAL  specimens in patients over 103 years of age), is one component of a comprehensive surveillance program.  Test performance has been validated by [facility] for patients greater than or equal to 54 year old. It is not intended to diagnose infection nor to guide or monitor treatment.   Culture, blood (Routine X 2) w Reflex to ID Panel     Status: None   Collection Time: 02/18/17  2:52 PM  Result Value Ref Range Status   Specimen Description BLOOD RIGHT HAND  Final   Special Requests IN PEDIATRIC BOTTLE Blood Culture adequate volume  Final   Culture NO GROWTH 5 DAYS  Final   Report Status 02/23/2017 FINAL  Final  Culture, blood (Routine X 2) w Reflex to ID Panel     Status: Abnormal   Collection Time: 02/18/17  2:55 PM  Result Value Ref Range Status   Specimen Description BLOOD RIGHT WRIST  Final   Special Requests IN PEDIATRIC BOTTLE Blood Culture adequate volume  Final   Culture  Setup Time   Final    IN PEDIATRIC BOTTLE GRAM POSITIVE RODS CRITICAL RESULT CALLED TO, READ BACK BY  AND VERIFIED WITH: L FOLTANSKI,PHARMD AT 0730 02/19/17 BY L BENFIELD    Culture (A)  Final    BACILLUS SPECIES Standardized susceptibility testing for this organism is not available. ENTEROCOCCUS DETECTED ON BCID, NOT RECOVERED IN CULTURE    Report Status 02/21/2017 FINAL  Final  Blood Culture ID Panel (Reflexed)     Status: Abnormal   Collection Time: 02/18/17  2:55 PM  Result Value Ref Range Status   Enterococcus species DETECTED (A) NOT DETECTED Final    Comment: CRITICAL RESULT CALLED TO, READ BACK BY AND VERIFIED WITH: L FOLTANSKI,PHARMD AT 0730 02/19/17 BY L BENFIELD    Vancomycin resistance NOT DETECTED NOT DETECTED Final   Listeria monocytogenes NOT DETECTED NOT DETECTED Final   Staphylococcus species NOT DETECTED NOT DETECTED Final   Staphylococcus aureus NOT DETECTED NOT DETECTED Final   Streptococcus species NOT DETECTED NOT DETECTED Final   Streptococcus agalactiae NOT DETECTED NOT DETECTED Final   Streptococcus pneumoniae NOT DETECTED NOT DETECTED Final   Streptococcus pyogenes NOT DETECTED NOT DETECTED Final   Acinetobacter baumannii NOT DETECTED NOT DETECTED Final   Enterobacteriaceae species NOT DETECTED NOT DETECTED Final   Enterobacter cloacae complex NOT DETECTED NOT DETECTED Final   Escherichia coli NOT DETECTED NOT DETECTED Final   Klebsiella oxytoca NOT DETECTED NOT DETECTED Final   Klebsiella pneumoniae NOT DETECTED NOT DETECTED Final   Proteus species NOT DETECTED NOT DETECTED Final   Serratia marcescens NOT DETECTED NOT DETECTED Final   Haemophilus influenzae NOT DETECTED NOT DETECTED Final   Neisseria meningitidis NOT DETECTED NOT DETECTED Final   Pseudomonas aeruginosa NOT DETECTED NOT DETECTED Final   Candida albicans NOT DETECTED NOT DETECTED Final   Candida glabrata NOT DETECTED NOT DETECTED Final   Candida krusei NOT DETECTED NOT DETECTED Final   Candida parapsilosis NOT DETECTED NOT DETECTED Final   Candida tropicalis NOT DETECTED NOT DETECTED Final   Culture, blood (routine x 2)     Status: None   Collection Time: 02/20/17  4:35 AM  Result Value Ref Range Status   Specimen Description BLOOD RIGHT HAND  Final   Special Requests   Final    BOTTLES DRAWN AEROBIC AND ANAEROBIC Blood Culture adequate volume   Culture NO GROWTH 5 DAYS  Final   Report Status 02/25/2017 FINAL  Final  Culture, blood (routine x 2)  Status: None   Collection Time: 02/20/17  4:35 AM  Result Value Ref Range Status   Specimen Description BLOOD RIGHT HAND  Final   Special Requests   Final    BOTTLES DRAWN AEROBIC AND ANAEROBIC Blood Culture adequate volume   Culture NO GROWTH 5 DAYS  Final   Report Status 02/25/2017 FINAL  Final    Anti-infectives:  Anti-infectives    Start     Dose/Rate Route Frequency Ordered Stop   02/19/17 2200  vancomycin (VANCOCIN) IVPB 750 mg/150 ml premix  Status:  Discontinued     750 mg 150 mL/hr over 60 Minutes Intravenous Every 12 hours 02/19/17 0807 02/20/17 1150   02/19/17 0900  vancomycin (VANCOCIN) 2,000 mg in sodium chloride 0.9 % 500 mL IVPB     2,000 mg 250 mL/hr over 120 Minutes Intravenous  Once 02/19/17 0807 02/19/17 1151   02/17/17 1100  cefTRIAXone (ROCEPHIN) 2 g in dextrose 5 % 50 mL IVPB  Status:  Discontinued     2 g 100 mL/hr over 30 Minutes Intravenous Every 24 hours 02/17/17 1041 02/19/17 0801   02/14/17 1500  piperacillin-tazobactam (ZOSYN) IVPB 3.375 g  Status:  Discontinued     3.375 g 12.5 mL/hr over 240 Minutes Intravenous Every 8 hours 02/14/17 1351 02/17/17 1041   02/11/17 1000  piperacillin-tazobactam (ZOSYN) IVPB 3.375 g  Status:  Discontinued     3.375 g 12.5 mL/hr over 240 Minutes Intravenous Every 8 hours 02/11/17 0858 02/14/17 1351   02/11/17 1000  vancomycin (VANCOCIN) IVPB 750 mg/150 ml premix  Status:  Discontinued     750 mg 150 mL/hr over 60 Minutes Intravenous Every 12 hours 02/11/17 0858 02/14/17 0844   02/07/17 2000  ceFAZolin (ANCEF) IVPB 1 g/50 mL premix     1 g 100 mL/hr over 30  Minutes Intravenous Every 8 hours 02/07/17 1416 02/08/17 1543      Best Practice/Protocols:  VTE Prophylaxis: Lovenox (prophylaxtic dose) and Mechanical GI Prophylaxis: Proton Pump Inhibitor Only getting enteral sedatives  Consults: Treatment Team:  Altamese Tanacross, MD    Events:  Subjective:    Overnight Issues: No specific issues except redness around G-tube site and leaking around trach and not getting full tidal volumes  Objective:  Vital signs for last 24 hours: Temp:  [98.2 F (36.8 C)-98.9 F (37.2 C)] 98.9 F (37.2 C) (09/17 0800) Pulse Rate:  [75-95] 91 (09/17 0842) Resp:  [19-37] 26 (09/17 0842) BP: (107-143)/(66-120) 142/120 (09/17 0842) SpO2:  [98 %-100 %] 99 % (09/17 0842) FiO2 (%):  [30 %-40 %] 40 % (09/17 0842) Weight:  [95.9 kg (211 lb 6.7 oz)] 95.9 kg (211 lb 6.7 oz) (09/17 0400)  Hemodynamic parameters for last 24 hours:    Intake/Output from previous day: 09/16 0701 - 09/17 0700 In: 3770 [I.V.:1210; NG/GT:2560] Out: 1925 [Urine:1925]  Intake/Output this shift: No intake/output data recorded.  Vent settings for last 24 hours: Vent Mode: PRVC FiO2 (%):  [30 %-40 %] 40 % Set Rate:  [20 bmp] 20 bmp Vt Set:  [580 mL] 580 mL PEEP:  [5 cmH20] 5 cmH20 Pressure Support:  [12 cmH20] 12 cmH20 Plateau Pressure:  [21 cmH20-22 cmH20] 21 cmH20  Physical Exam:  General: alert and no respiratory distress Neuro: alert, oriented and nonfocal exam HEENT/Neck: Trach site is red, leaking 60% of tidal volum with #6 tracheostomy tube in place. Resp: rhonchi bilaterally CVS: regular rate and rhythm, S1, S2 normal, no murmur, click, rub or gallop GI: 6-7 cm area  of erythema around G-tube site, no TF leakage.  Able to aspirate Extremities: No changes  Results for orders placed or performed during the hospital encounter of 02/07/17 (from the past 24 hour(s))  Glucose, capillary     Status: Abnormal   Collection Time: 03/05/17 11:49 AM  Result Value Ref Range    Glucose-Capillary 152 (H) 65 - 99 mg/dL  Glucose, capillary     Status: Abnormal   Collection Time: 03/05/17  4:22 PM  Result Value Ref Range   Glucose-Capillary 155 (H) 65 - 99 mg/dL  Glucose, capillary     Status: Abnormal   Collection Time: 03/05/17  7:37 PM  Result Value Ref Range   Glucose-Capillary 132 (H) 65 - 99 mg/dL  Glucose, capillary     Status: Abnormal   Collection Time: 03/05/17 11:07 PM  Result Value Ref Range   Glucose-Capillary 149 (H) 65 - 99 mg/dL  Glucose, capillary     Status: Abnormal   Collection Time: 03/06/17  3:42 AM  Result Value Ref Range   Glucose-Capillary 152 (H) 65 - 99 mg/dL  Comprehensive metabolic panel     Status: Abnormal   Collection Time: 03/06/17  4:48 AM  Result Value Ref Range   Sodium 136 135 - 145 mmol/L   Potassium 4.7 3.5 - 5.1 mmol/L   Chloride 107 101 - 111 mmol/L   CO2 25 22 - 32 mmol/L   Glucose, Bld 133 (H) 65 - 99 mg/dL   BUN 37 (H) 6 - 20 mg/dL   Creatinine, Ser 0.95 0.61 - 1.24 mg/dL   Calcium 7.8 (L) 8.9 - 10.3 mg/dL   Total Protein 5.7 (L) 6.5 - 8.1 g/dL   Albumin 1.4 (L) 3.5 - 5.0 g/dL   AST 38 15 - 41 U/L   ALT 61 17 - 63 U/L   Alkaline Phosphatase 1,009 (H) 38 - 126 U/L   Total Bilirubin 3.7 (H) 0.3 - 1.2 mg/dL   GFR calc non Af Amer >60 >60 mL/min   GFR calc Af Amer >60 >60 mL/min   Anion gap 4 (L) 5 - 15  CBC     Status: Abnormal   Collection Time: 03/06/17  4:48 AM  Result Value Ref Range   WBC 12.8 (H) 4.0 - 10.5 K/uL   RBC 2.43 (L) 4.22 - 5.81 MIL/uL   Hemoglobin 7.0 (L) 13.0 - 17.0 g/dL   HCT 21.6 (L) 39.0 - 52.0 %   MCV 88.9 78.0 - 100.0 fL   MCH 28.8 26.0 - 34.0 pg   MCHC 32.4 30.0 - 36.0 g/dL   RDW 15.4 11.5 - 15.5 %   Platelets 426 (H) 150 - 400 K/uL  Prealbumin     Status: Abnormal   Collection Time: 03/06/17  6:01 AM  Result Value Ref Range   Prealbumin 9.9 (L) 18 - 38 mg/dL  Glucose, capillary     Status: Abnormal   Collection Time: 03/06/17  8:49 AM  Result Value Ref Range    Glucose-Capillary 132 (H) 65 - 99 mg/dL   Comment 1 Notify RN    Comment 2 Document in Chart      Assessment/Plan:   NEURO  Altered Mental Status:  sedation and Not awakening very briskly   Plan: Look at all sedation so that we have a chace to wean him to trach collar.  PULM  Leak from tracheostomy.  CXR not done today or over the weekend   Plan: I changed the trach to #8, and  the leakage improved dramatically although still intermittently would hear leakage.  He was no long losing tiday volume.  Will get long distal trach to bedside.   CARDIO  No specific issues   Plan: CPM  RENAL  Hypervolemia and but BUN 37, creatinine .95   Plan: No specific action to take at this time  GI  s/p Gastrostomy and has redness at G-tube site.  Flushed the tube and aspirated without difficulty, but did get more air on aspiration than expected.  Tube feedings also aspirated.  G-tube may have been disloadged.  Able to flush easily and may be able to get tube replace in IR if it happens to be displaced.   Plan: CT scan of the abdomen to assess G-tube placement.  ID  No current infectious problems.  Will see if he needs to go back on antibiotics for G-tube site infection.   Plan: Treat known infecitous problems  HEME  Anemia acute blood loss anemia and anemia of critical illness)   Plan: Will give the patient one unit of PRBC  ENDO No new issues   Plan: No changes  Global Issues  Trach site seems to be better and patient is not longer losing tidal volume.  Will need CXR.  G-tube site looks inflamed, Bolster may be partially in the stomach.  Will gett CT scan of the abdomen to assess.    LOS: 27 days   Additional comments:I reviewed the patient's new clinical lab test results. cbc/bmet and I reviewed the patients new imaging test results. cxr/ct to see  Critical Care Total Time*: 45 Minutes  Peter [name] 03/06/2017  *Care during the described time interval was provided by me and/or other providers  on the critical care team.  I have reviewed this patient's available data, including medical history, events of note, physical examination and test results as part of my evaluation.

## 2017-03-06 NOTE — Consult Note (Addendum)
[facility] Nurse wound follow-up consult note Reason for Consult: Re-assessed right heel wound, refer to previous consult note on 9/11. Wound type: Dark purple deep tissue injury  Pressure Injury POA: No Measurement: Slowly decreasing in size and less dark-purple in color; 2X2.5cm Dressing procedure/placement/frequency: Heels are already floated in heel lift boots to reduce pressure.  Family at the bedside to assess location. Please re-consult if further assistance is needed.  Thank-you,  Julien Girt MSN, [place], [place], [place], [place]

## 2017-03-06 NOTE — Progress Notes (Signed)
PT Cancellation Note  Patient Details Name: Sidney Kann MRN: [id number] DOB: 02/01/1961   Cancelled Treatment:    Reason Eval/Treat Not Completed: Patient at procedure or test/unavailable (pt having issues with PEG and OOR for CT) Will attempt later as time allows   Annelisa [name] B Oshua Mcconaha 03/06/2017, 11:21 AM  [name], [place]

## 2017-03-07 ENCOUNTER — Inpatient Hospital Stay (HOSPITAL_COMMUNITY): Payer: BLUE CROSS/BLUE SHIELD

## 2017-03-07 ENCOUNTER — Encounter (HOSPITAL_COMMUNITY): Payer: Self-pay

## 2017-03-07 LAB — TYPE AND SCREEN
ABO/RH(D): AB POS
ANTIBODY SCREEN: NEGATIVE
UNIT DIVISION: 0
UNIT DIVISION: 0

## 2017-03-07 LAB — CBC WITH DIFFERENTIAL/PLATELET
BASOS PCT: 0 %
Basophils Absolute: 0 10*3/uL (ref 0.0–0.1)
EOS ABS: 0.4 10*3/uL (ref 0.0–0.7)
EOS PCT: 3 %
HEMATOCRIT: 23.8 % — AB (ref 39.0–52.0)
Hemoglobin: 7.7 g/dL — ABNORMAL LOW (ref 13.0–17.0)
Lymphocytes Relative: 9 %
Lymphs Abs: 1.2 10*3/uL (ref 0.7–4.0)
MCH: 28.9 pg (ref 26.0–34.0)
MCHC: 32.4 g/dL (ref 30.0–36.0)
MCV: 89.5 fL (ref 78.0–100.0)
MONO ABS: 1.7 10*3/uL — AB (ref 0.1–1.0)
MONOS PCT: 13 %
Neutro Abs: 9.7 10*3/uL — ABNORMAL HIGH (ref 1.7–7.7)
Neutrophils Relative %: 75 %
PLATELETS: 476 10*3/uL — AB (ref 150–400)
RBC: 2.66 MIL/uL — ABNORMAL LOW (ref 4.22–5.81)
RDW: 15.2 % (ref 11.5–15.5)
WBC: 13 10*3/uL — ABNORMAL HIGH (ref 4.0–10.5)

## 2017-03-07 LAB — BPAM RBC
Blood Product Expiration Date: 201810032359
Blood Product Expiration Date: 201810042359
ISSUE DATE / TIME: 201809141024
ISSUE DATE / TIME: 201809171143
UNIT TYPE AND RH: 8400
UNIT TYPE AND RH: 8400

## 2017-03-07 LAB — GLUCOSE, CAPILLARY
GLUCOSE-CAPILLARY: 116 mg/dL — AB (ref 65–99)
GLUCOSE-CAPILLARY: 136 mg/dL — AB (ref 65–99)
GLUCOSE-CAPILLARY: 128 mg/dL — AB (ref 65–99)
Glucose-Capillary: 164 mg/dL — ABNORMAL HIGH (ref 65–99)
Glucose-Capillary: 171 mg/dL — ABNORMAL HIGH (ref 65–99)

## 2017-03-07 LAB — BASIC METABOLIC PANEL
Anion gap: 3 — ABNORMAL LOW (ref 5–15)
BUN: 35 mg/dL — ABNORMAL HIGH (ref 6–20)
CO2: 26 mmol/L (ref 22–32)
CREATININE: 0.95 mg/dL (ref 0.61–1.24)
Calcium: 7.8 mg/dL — ABNORMAL LOW (ref 8.9–10.3)
Chloride: 107 mmol/L (ref 101–111)
Glucose, Bld: 139 mg/dL — ABNORMAL HIGH (ref 65–99)
Potassium: 5 mmol/L (ref 3.5–5.1)
SODIUM: 136 mmol/L (ref 135–145)

## 2017-03-07 MED ORDER — ENOXAPARIN SODIUM 40 MG/0.4ML ~~LOC~~ SOLN
40.0000 mg | Freq: Every day | SUBCUTANEOUS | Status: DC
  Administered 2017-03-07 – 2017-03-14 (×8): 40 mg via SUBCUTANEOUS
  Filled 2017-03-07 (×8): qty 0.4

## 2017-03-07 MED ORDER — CHLORHEXIDINE GLUCONATE 0.12% ORAL RINSE (MEDLINE KIT)
15.0000 mL | Freq: Two times a day (BID) | OROMUCOSAL | Status: DC
  Administered 2017-03-07 – 2017-03-12 (×11): 15 mL via OROMUCOSAL

## 2017-03-07 MED ORDER — ORAL CARE MOUTH RINSE
15.0000 mL | OROMUCOSAL | Status: DC
  Administered 2017-03-07 – 2017-03-12 (×48): 15 mL via OROMUCOSAL

## 2017-03-07 NOTE — Progress Notes (Signed)
Follow up - Trauma and Critical Care  Patient Details:    Peter [name] is an 56 y.o. male.  Lines/tubes : PICC Double Lumen 02/23/17 PICC Right Cephalic 42 cm 0 cm (Active)  Indication for Insertion or Continuance of Line Prolonged intravenous therapies 03/07/2017  8:00 AM  Exposed Catheter (cm) 0 cm 03/07/2017  8:00 AM  Site Assessment Clean;Dry;Intact 03/07/2017  8:00 AM  Lumen #1 Status In-line blood sampling system in place;Infusing 03/07/2017  8:00 AM  Lumen #2 Status Infusing 03/07/2017  8:00 AM  Dressing Type Transparent;Occlusive 03/07/2017  8:00 AM  Dressing Status Clean;Dry;Intact;Antimicrobial disc in place 03/07/2017  8:00 AM  Line Care Connections checked and tightened 03/07/2017  8:00 AM  Dressing Intervention Dressing changed 03/03/2017  8:00 AM  Dressing Change Due 03/10/17 03/07/2017  8:00 AM     Gastrostomy/Enterostomy PEG-jejunostomy (Active)  Surrounding Skin Erythema 03/07/2017  8:00 AM  Tube Status Patent 03/07/2017  8:00 AM  Drainage Appearance Purulent 03/07/2017  4:00 AM  Dressing Status Old drainage 03/07/2017  8:00 AM  Dressing Intervention Dressing changed 03/06/2017  8:00 AM  Dressing Type Abdominal Binder;Split gauze 03/07/2017  8:00 AM  G Port Intake (mL) 200 ml 03/04/2017 10:00 PM     Rectal Tube/Pouch (Active)  Output (mL) 100 mL 03/07/2017  8:00 AM     Urethral Catheter [name] Non-latex 16 Fr. (Active)  Indication for Insertion or Continuance of Catheter Acute urinary retention;Bladder outlet obstruction / other urologic reason 03/07/2017  8:00 AM  Site Assessment Clean;Intact 03/07/2017  8:00 AM  Catheter Maintenance Bag below level of bladder;Catheter secured;Drainage bag/tubing not touching floor;Insertion date on drainage bag;No dependent loops;Seal intact;Bag emptied prior to transport 03/07/2017  8:00 AM  Collection Container Standard drainage bag 03/07/2017  8:00 AM  Securement Method Leg strap 03/07/2017  8:00 AM  Urinary Catheter Interventions  Unclamped 03/07/2017  8:00 AM  Output (mL) 325 mL 03/07/2017  8:00 AM    Microbiology/Sepsis markers: Results for orders placed or performed during the hospital encounter of 02/07/17  MRSA PCR Screening     Status: None   Collection Time: 02/08/17  4:08 AM  Result Value Ref Range Status   MRSA by PCR NEGATIVE NEGATIVE Final    Comment:        The GeneXpert MRSA Assay (FDA approved for NASAL specimens only), is one component of a comprehensive MRSA colonization surveillance program. It is not intended to diagnose MRSA infection nor to guide or monitor treatment for MRSA infections.   Culture, respiratory (NON-Expectorated)     Status: None   Collection Time: 02/10/17 10:59 AM  Result Value Ref Range Status   Specimen Description TRACHEAL ASPIRATE  Final   Special Requests Normal  Final   Gram Stain   Final    ABUNDANT WBC PRESENT, PREDOMINANTLY PMN RARE SQUAMOUS EPITHELIAL CELLS PRESENT ABUNDANT GRAM NEGATIVE COCCI IN PAIRS ABUNDANT GRAM NEGATIVE RODS MODERATE GRAM POSITIVE COCCI IN CHAINS    Culture ABUNDANT Consistent with normal respiratory flora.  Final   Report Status 02/12/2017 FINAL  Final  Culture, blood (Routine X 2) w Reflex to ID Panel     Status: Abnormal   Collection Time: 02/11/17  3:25 PM  Result Value Ref Range Status   Specimen Description BLOOD RIGHT HAND  Final   Special Requests   Final    BOTTLES DRAWN AEROBIC ONLY Blood Culture adequate volume   Culture  Setup Time   Final    GRAM NEGATIVE RODS AEROBIC BOTTLE ONLY  CRITICAL VALUE NOTED.  VALUE IS CONSISTENT WITH PREVIOUSLY REPORTED AND CALLED VALUE.    Culture (A)  Final    KLEBSIELLA OXYTOCA SUSCEPTIBILITIES PERFORMED ON PREVIOUS CULTURE WITHIN THE LAST 5 DAYS.    Report Status 02/17/2017 FINAL  Final  Culture, blood (Routine X 2) w Reflex to ID Panel     Status: Abnormal   Collection Time: 02/11/17  3:25 PM  Result Value Ref Range Status   Specimen Description BLOOD RIGHT HAND  Final    Special Requests   Final    BOTTLES DRAWN AEROBIC ONLY Blood Culture adequate volume   Culture  Setup Time   Final    GRAM NEGATIVE RODS AEROBIC BOTTLE ONLY CRITICAL RESULT CALLED TO, READ BACK BY AND VERIFIED WITH: J.LEDFORD, PHARMD 02/15/17 0632 L.CHAMPION    Culture KLEBSIELLA OXYTOCA (A)  Final   Report Status 02/17/2017 FINAL  Final   Organism ID, Bacteria KLEBSIELLA OXYTOCA  Final      Susceptibility   Klebsiella oxytoca - MIC*    AMPICILLIN >=32 RESISTANT Resistant     CEFAZOLIN >=64 RESISTANT Resistant     CEFEPIME <=1 SENSITIVE Sensitive     CEFTAZIDIME <=1 SENSITIVE Sensitive     CEFTRIAXONE <=1 SENSITIVE Sensitive     CIPROFLOXACIN <=0.25 SENSITIVE Sensitive     GENTAMICIN <=1 SENSITIVE Sensitive     IMIPENEM <=0.25 SENSITIVE Sensitive     TRIMETH/SULFA <=20 SENSITIVE Sensitive     AMPICILLIN/SULBACTAM 8 SENSITIVE Sensitive     PIP/TAZO <=4 SENSITIVE Sensitive     Extended ESBL NEGATIVE Sensitive     * KLEBSIELLA OXYTOCA  Blood Culture ID Panel (Reflexed)     Status: Abnormal   Collection Time: 02/11/17  3:25 PM  Result Value Ref Range Status   Enterococcus species NOT DETECTED NOT DETECTED Final   Listeria monocytogenes NOT DETECTED NOT DETECTED Final   Staphylococcus species NOT DETECTED NOT DETECTED Final   Staphylococcus aureus NOT DETECTED NOT DETECTED Final   Streptococcus species NOT DETECTED NOT DETECTED Final   Streptococcus agalactiae NOT DETECTED NOT DETECTED Final   Streptococcus pneumoniae NOT DETECTED NOT DETECTED Final   Streptococcus pyogenes NOT DETECTED NOT DETECTED Final   Acinetobacter baumannii NOT DETECTED NOT DETECTED Final   Enterobacteriaceae species DETECTED (A) NOT DETECTED Final    Comment: Enterobacteriaceae represent a large family of gram-negative bacteria, not a single organism. CRITICAL RESULT CALLED TO, READ BACK BY AND VERIFIED WITH: J.LEDFORD, PHARMD 02/15/17 0632 L.CHAMPION    Enterobacter cloacae complex NOT DETECTED NOT  DETECTED Final   Escherichia coli NOT DETECTED NOT DETECTED Final   Klebsiella oxytoca DETECTED (A) NOT DETECTED Final    Comment: CRITICAL RESULT CALLED TO, READ BACK BY AND VERIFIED WITH: J.LEDFORD, PHARMD 02/15/17 0632 L.CHAMPION    Klebsiella pneumoniae NOT DETECTED NOT DETECTED Final   Proteus species NOT DETECTED NOT DETECTED Final   Serratia marcescens NOT DETECTED NOT DETECTED Final   Carbapenem resistance NOT DETECTED NOT DETECTED Final   Haemophilus influenzae NOT DETECTED NOT DETECTED Final   Neisseria meningitidis NOT DETECTED NOT DETECTED Final   Pseudomonas aeruginosa NOT DETECTED NOT DETECTED Final   Candida albicans NOT DETECTED NOT DETECTED Final   Candida glabrata NOT DETECTED NOT DETECTED Final   Candida krusei NOT DETECTED NOT DETECTED Final   Candida parapsilosis NOT DETECTED NOT DETECTED Final   Candida tropicalis NOT DETECTED NOT DETECTED Final  Surgical PCR screen     Status: None   Collection Time: 02/13/17  6:30 AM  Result Value Ref Range Status   MRSA, PCR NEGATIVE NEGATIVE Final   Staphylococcus aureus NEGATIVE NEGATIVE Final    Comment:        The Xpert SA Assay (FDA approved for NASAL specimens in patients over 7 years of age), is one component of a comprehensive surveillance program.  Test performance has been validated by [facility] for patients greater than or equal to 64 year old. It is not intended to diagnose infection nor to guide or monitor treatment.   Culture, blood (Routine X 2) w Reflex to ID Panel     Status: None   Collection Time: 02/18/17  2:52 PM  Result Value Ref Range Status   Specimen Description BLOOD RIGHT HAND  Final   Special Requests IN PEDIATRIC BOTTLE Blood Culture adequate volume  Final   Culture NO GROWTH 5 DAYS  Final   Report Status 02/23/2017 FINAL  Final  Culture, blood (Routine X 2) w Reflex to ID Panel     Status: Abnormal   Collection Time: 02/18/17  2:55 PM  Result Value Ref Range Status   Specimen  Description BLOOD RIGHT WRIST  Final   Special Requests IN PEDIATRIC BOTTLE Blood Culture adequate volume  Final   Culture  Setup Time   Final    IN PEDIATRIC BOTTLE GRAM POSITIVE RODS CRITICAL RESULT CALLED TO, READ BACK BY AND VERIFIED WITH: L FOLTANSKI,PHARMD AT 0730 02/19/17 BY L BENFIELD    Culture (A)  Final    BACILLUS SPECIES Standardized susceptibility testing for this organism is not available. ENTEROCOCCUS DETECTED ON BCID, NOT RECOVERED IN CULTURE    Report Status 02/21/2017 FINAL  Final  Blood Culture ID Panel (Reflexed)     Status: Abnormal   Collection Time: 02/18/17  2:55 PM  Result Value Ref Range Status   Enterococcus species DETECTED (A) NOT DETECTED Final    Comment: CRITICAL RESULT CALLED TO, READ BACK BY AND VERIFIED WITH: L FOLTANSKI,PHARMD AT 0730 02/19/17 BY L BENFIELD    Vancomycin resistance NOT DETECTED NOT DETECTED Final   Listeria monocytogenes NOT DETECTED NOT DETECTED Final   Staphylococcus species NOT DETECTED NOT DETECTED Final   Staphylococcus aureus NOT DETECTED NOT DETECTED Final   Streptococcus species NOT DETECTED NOT DETECTED Final   Streptococcus agalactiae NOT DETECTED NOT DETECTED Final   Streptococcus pneumoniae NOT DETECTED NOT DETECTED Final   Streptococcus pyogenes NOT DETECTED NOT DETECTED Final   Acinetobacter baumannii NOT DETECTED NOT DETECTED Final   Enterobacteriaceae species NOT DETECTED NOT DETECTED Final   Enterobacter cloacae complex NOT DETECTED NOT DETECTED Final   Escherichia coli NOT DETECTED NOT DETECTED Final   Klebsiella oxytoca NOT DETECTED NOT DETECTED Final   Klebsiella pneumoniae NOT DETECTED NOT DETECTED Final   Proteus species NOT DETECTED NOT DETECTED Final   Serratia marcescens NOT DETECTED NOT DETECTED Final   Haemophilus influenzae NOT DETECTED NOT DETECTED Final   Neisseria meningitidis NOT DETECTED NOT DETECTED Final   Pseudomonas aeruginosa NOT DETECTED NOT DETECTED Final   Candida albicans NOT DETECTED  NOT DETECTED Final   Candida glabrata NOT DETECTED NOT DETECTED Final   Candida krusei NOT DETECTED NOT DETECTED Final   Candida parapsilosis NOT DETECTED NOT DETECTED Final   Candida tropicalis NOT DETECTED NOT DETECTED Final  Culture, blood (routine x 2)     Status: None   Collection Time: 02/20/17  4:35 AM  Result Value Ref Range Status   Specimen Description BLOOD RIGHT HAND  Final   Special Requests  Final    BOTTLES DRAWN AEROBIC AND ANAEROBIC Blood Culture adequate volume   Culture NO GROWTH 5 DAYS  Final   Report Status 02/25/2017 FINAL  Final  Culture, blood (routine x 2)     Status: None   Collection Time: 02/20/17  4:35 AM  Result Value Ref Range Status   Specimen Description BLOOD RIGHT HAND  Final   Special Requests   Final    BOTTLES DRAWN AEROBIC AND ANAEROBIC Blood Culture adequate volume   Culture NO GROWTH 5 DAYS  Final   Report Status 02/25/2017 FINAL  Final    Anti-infectives:  Anti-infectives    Start     Dose/Rate Route Frequency Ordered Stop   03/06/17 1600  ceFAZolin (ANCEF) IVPB 1 g/50 mL premix     1 g 100 mL/hr over 30 Minutes Intravenous Every 8 hours 03/06/17 1511 03/09/17 1359   02/19/17 2200  vancomycin (VANCOCIN) IVPB 750 mg/150 ml premix  Status:  Discontinued     750 mg 150 mL/hr over 60 Minutes Intravenous Every 12 hours 02/19/17 0807 02/20/17 1150   02/19/17 0900  vancomycin (VANCOCIN) 2,000 mg in sodium chloride 0.9 % 500 mL IVPB     2,000 mg 250 mL/hr over 120 Minutes Intravenous  Once 02/19/17 0807 02/19/17 1151   02/17/17 1100  cefTRIAXone (ROCEPHIN) 2 g in dextrose 5 % 50 mL IVPB  Status:  Discontinued     2 g 100 mL/hr over 30 Minutes Intravenous Every 24 hours 02/17/17 1041 02/19/17 0801   02/14/17 1500  piperacillin-tazobactam (ZOSYN) IVPB 3.375 g  Status:  Discontinued     3.375 g 12.5 mL/hr over 240 Minutes Intravenous Every 8 hours 02/14/17 1351 02/17/17 1041   02/11/17 1000  piperacillin-tazobactam (ZOSYN) IVPB 3.375 g  Status:   Discontinued     3.375 g 12.5 mL/hr over 240 Minutes Intravenous Every 8 hours 02/11/17 0858 02/14/17 1351   02/11/17 1000  vancomycin (VANCOCIN) IVPB 750 mg/150 ml premix  Status:  Discontinued     750 mg 150 mL/hr over 60 Minutes Intravenous Every 12 hours 02/11/17 0858 02/14/17 0844   02/07/17 2000  ceFAZolin (ANCEF) IVPB 1 g/50 mL premix     1 g 100 mL/hr over 30 Minutes Intravenous Every 8 hours 02/07/17 1416 02/08/17 1543      Best Practice/Protocols:  VTE Prophylaxis: Mechanical GI Prophylaxis: Proton Pump Inhibitor Continous Sedation  Consults: Treatment Team:  Altamese Sutherland, MD    Events:  Subjective:    Overnight Issues: May be oversedated.  Will consider holding some of his sedatives  Objective:  Vital signs for last 24 hours: Temp:  [98.6 F (37 C)-99.6 F (37.6 C)] 99.2 F (37.3 C) (09/18 0800) Pulse Rate:  [77-106] 102 (09/18 0900) Resp:  [18-29] 29 (09/18 0900) BP: (102-154)/(59-88) 154/78 (09/18 0900) SpO2:  [83 %-100 %] 99 % (09/18 0900) FiO2 (%):  [30 %-40 %] 30 % (09/18 0305) Weight:  [96.8 kg (213 lb 6.5 oz)] 96.8 kg (213 lb 6.5 oz) (09/18 0400)  Hemodynamic parameters for last 24 hours:    Intake/Output from previous day: 09/17 0701 - 09/18 0700 In: 3205 [I.V.:1200; Blood:445; NG/GT:1560] Out: 1845 [Urine:1845]  Intake/Output this shift: Total I/O In: -  Out: 425 [Urine:325; Stool:100]  Vent settings for last 24 hours: Vent Mode: PRVC FiO2 (%):  [30 %-40 %] 30 % Set Rate:  [20 bmp] 20 bmp Vt Set:  [580 mL] 580 mL PEEP:  [5 cmH20] 5 cmH20 Pressure Support:  [38  [place] Pressure:  [10 YTK16-01 cmH20] 10 cmH20  Physical Exam:  General: alert and no respiratory distress Neuro: alert, oriented and nonfocal exam HEENT/Neck: trach-clean, intact and changed to long proximal trach with success today. Resp: rhonchi bilaterally CVS: regular rate and rhythm, S1, S2 normal, no murmur, click, rub or gallop GI: soft,  nontender, BS WNL, no r/g and tolerating tube feedings.   Extremities: no edema, no erythema, pulses WNL and No changes  Results for orders placed or performed during the hospital encounter of 02/07/17 (from the past 24 hour(s))  Glucose, capillary     Status: Abnormal   Collection Time: 03/06/17 11:42 AM  Result Value Ref Range   Glucose-Capillary 107 (H) 65 - 99 mg/dL   Comment 1 Notify RN    Comment 2 Document in Chart   Glucose, capillary     Status: Abnormal   Collection Time: 03/06/17  3:53 PM  Result Value Ref Range   Glucose-Capillary 130 (H) 65 - 99 mg/dL  Glucose, capillary     Status: Abnormal   Collection Time: 03/06/17  7:58 PM  Result Value Ref Range   Glucose-Capillary 126 (H) 65 - 99 mg/dL  Glucose, capillary     Status: Abnormal   Collection Time: 03/06/17 11:19 PM  Result Value Ref Range   Glucose-Capillary 162 (H) 65 - 99 mg/dL  Glucose, capillary     Status: Abnormal   Collection Time: 03/07/17  3:10 AM  Result Value Ref Range   Glucose-Capillary 116 (H) 65 - 99 mg/dL  CBC with Differential/Platelet     Status: Abnormal   Collection Time: 03/07/17  5:10 AM  Result Value Ref Range   WBC 13.0 (H) 4.0 - 10.5 K/uL   RBC 2.66 (L) 4.22 - 5.81 MIL/uL   Hemoglobin 7.7 (L) 13.0 - 17.0 g/dL   HCT 23.8 (L) 39.0 - 52.0 %   MCV 89.5 78.0 - 100.0 fL   MCH 28.9 26.0 - 34.0 pg   MCHC 32.4 30.0 - 36.0 g/dL   RDW 15.2 11.5 - 15.5 %   Platelets 476 (H) 150 - 400 K/uL   Neutrophils Relative % 75 %   Neutro Abs 9.7 (H) 1.7 - 7.7 K/uL   Lymphocytes Relative 9 %   Lymphs Abs 1.2 0.7 - 4.0 K/uL   Monocytes Relative 13 %   Monocytes Absolute 1.7 (H) 0.1 - 1.0 K/uL   Eosinophils Relative 3 %   Eosinophils Absolute 0.4 0.0 - 0.7 K/uL   Basophils Relative 0 %   Basophils Absolute 0.0 0.0 - 0.1 K/uL  Basic metabolic panel     Status: Abnormal   Collection Time: 03/07/17  5:10 AM  Result Value Ref Range   Sodium 136 135 - 145 mmol/L   Potassium 5.0 3.5 - 5.1 mmol/L    Chloride 107 101 - 111 mmol/L   CO2 26 22 - 32 mmol/L   Glucose, Bld 139 (H) 65 - 99 mg/dL   BUN 35 (H) 6 - 20 mg/dL   Creatinine, Ser 0.95 0.61 - 1.24 mg/dL   Calcium 7.8 (L) 8.9 - 10.3 mg/dL   GFR calc non Af Amer >60 >60 mL/min   GFR calc Af Amer >60 >60 mL/min   Anion gap 3 (L) 5 - 15  Glucose, capillary     Status: Abnormal   Collection Time: 03/07/17  8:27 AM  Result Value Ref Range   Glucose-Capillary 164 (H) 65 - 99 mg/dL   Comment 1  Notify RN    Comment 2 Document in Chart      Assessment/Plan:   NEURO  Mental status is good and seems to be more awake.   Plan: Adjust sedation so patient acan be more interactive.  PULM  Atelectasis/collapse (bibasilar)   Plan: Still try to wean on the ventilator   CARDIO  No issues   Plan: CPM  RENAL  Urine output is good and renal functio is good.   Plan: CPM  GI  Ara around PEG still red, but not leaking and functioning well.   Plan: CPM  ID  No known in fectious source   Plan: CPM  HEME  Anemia acute blood loss anemia)   Plan: Hemoglobin up to 7.7 after transfusion yesterday.  ENDO No specific nproblems   Plan: CPM  Global Issues  Patient doing okay.  Long distal trach placed without event.  Had to replace because other #8 trach had pulled out with thepatient in the sitting position.    LOS: 28 days   Additional comments:I reviewed the patient's new clinical lab test results. cbc/bmet  Critical Care Total Time*: 45 Minutes  Vicky Schleich 03/07/2017  *Care during the described time interval was provided by me and/or other providers on the critical care team.  I have reviewed this patient's available data, including medical history, events of note, physical examination and test results as part of my evaluation.
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
## 2017-03-07 NOTE — Plan of Care (Signed)
Problem: Activity: Goal: Ability to tolerate increased activity will improve Outcome: Progressing Pt tolerated sitting in chair x 2 hours. Goal: Mobility will improve Outcome: Progressing Pt generalized weakness but able to move all extremities.

## 2017-03-08 ENCOUNTER — Encounter (HOSPITAL_COMMUNITY): Payer: Self-pay

## 2017-03-08 LAB — GLUCOSE, CAPILLARY
GLUCOSE-CAPILLARY: 137 mg/dL — AB (ref 65–99)
Glucose-Capillary: 130 mg/dL — ABNORMAL HIGH (ref 65–99)
Glucose-Capillary: 132 mg/dL — ABNORMAL HIGH (ref 65–99)
Glucose-Capillary: 133 mg/dL — ABNORMAL HIGH (ref 65–99)
Glucose-Capillary: 135 mg/dL — ABNORMAL HIGH (ref 65–99)
Glucose-Capillary: 152 mg/dL — ABNORMAL HIGH (ref 65–99)
Glucose-Capillary: 87 mg/dL (ref 65–99)

## 2017-03-08 LAB — CBC WITH DIFFERENTIAL/PLATELET
Basophils Absolute: 0 10*3/uL (ref 0.0–0.1)
Basophils Relative: 0 %
EOS PCT: 5 %
Eosinophils Absolute: 0.5 10*3/uL (ref 0.0–0.7)
HEMATOCRIT: 22.9 % — AB (ref 39.0–52.0)
Hemoglobin: 7.7 g/dL — ABNORMAL LOW (ref 13.0–17.0)
LYMPHS ABS: 1.3 10*3/uL (ref 0.7–4.0)
LYMPHS PCT: 12 %
MCH: 30.4 pg (ref 26.0–34.0)
MCHC: 33.6 g/dL (ref 30.0–36.0)
MCV: 90.5 fL (ref 78.0–100.0)
MONO ABS: 1.9 10*3/uL — AB (ref 0.1–1.0)
MONOS PCT: 17 %
NEUTROS ABS: 7.5 10*3/uL (ref 1.7–7.7)
Neutrophils Relative %: 66 %
PLATELETS: 525 10*3/uL — AB (ref 150–400)
RBC: 2.53 MIL/uL — ABNORMAL LOW (ref 4.22–5.81)
RDW: 15.2 % (ref 11.5–15.5)
WBC: 11.3 10*3/uL — ABNORMAL HIGH (ref 4.0–10.5)

## 2017-03-08 MED ORDER — QUETIAPINE FUMARATE 100 MG PO TABS
100.0000 mg | ORAL_TABLET | Freq: Every day | ORAL | Status: DC
  Administered 2017-03-09 – 2017-03-10 (×2): 100 mg via ORAL
  Filled 2017-03-08 (×2): qty 1

## 2017-03-08 MED ORDER — FERROUS SULFATE 220 (44 FE) MG/5ML PO ELIX
220.0000 mg | ORAL_SOLUTION | Freq: Two times a day (BID) | ORAL | Status: DC
  Administered 2017-03-08 – 2017-03-23 (×30): 220 mg
  Filled 2017-03-08 (×33): qty 5

## 2017-03-08 MED ORDER — MIDAZOLAM HCL 2 MG/2ML IJ SOLN: 2.0000 mg | INTRAMUSCULAR | Status: DC | PRN

## 2017-03-08 MED ORDER — QUETIAPINE FUMARATE 200 MG PO TABS
200.0000 mg | ORAL_TABLET | Freq: Every day | ORAL | Status: DC
  Administered 2017-03-08 – 2017-03-10 (×3): 200 mg
  Filled 2017-03-08 (×3): qty 1

## 2017-03-08 MED ORDER — CLONAZEPAM 1 MG PO TABS
2.0000 mg | ORAL_TABLET | Freq: Every day | ORAL | Status: DC
  Administered 2017-03-08 – 2017-03-10 (×3): 2 mg
  Filled 2017-03-08 (×3): qty 2
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
## 2017-03-08 NOTE — Progress Notes (Signed)
Follow up - Trauma and Critical Care  Patient Details:    Peter [name] is an 56 y.o. male.  Lines/tubes : PICC Double Lumen 02/23/17 PICC Right Cephalic 42 cm 0 cm (Active)  Indication for Insertion or Continuance of Line Prolonged intravenous therapies 03/08/2017  8:00 AM  Exposed Catheter (cm) 0 cm 03/07/2017  8:00 AM  Site Assessment Clean;Dry;Intact 03/08/2017  8:00 AM  Lumen #1 Status In-line blood sampling system in place;Infusing 03/08/2017  8:00 AM  Lumen #2 Status Infusing 03/08/2017  8:00 AM  Dressing Type Transparent;Occlusive 03/08/2017  8:00 AM  Dressing Status Clean;Dry;Intact;Antimicrobial disc in place 03/08/2017  8:00 AM  Line Care Connections checked and tightened 03/08/2017  8:00 AM  Dressing Intervention Dressing changed 03/03/2017  8:00 AM  Dressing Change Due 03/10/17 03/08/2017  8:00 AM     Gastrostomy/Enterostomy PEG-jejunostomy (Active)  Surrounding Skin Erythema 03/08/2017  8:00 AM  Tube Status Patent 03/08/2017  8:00 AM  Drainage Appearance Purulent 03/08/2017  8:00 AM  Dressing Status Old drainage 03/08/2017  8:00 AM  Dressing Intervention New dressing 03/08/2017  8:00 AM  Dressing Type Abdominal Binder;Split gauze 03/08/2017  8:00 AM  G Port Intake (mL) 200 ml 03/04/2017 10:00 PM     Rectal Tube/Pouch (Active)  Output (mL) 100 mL 03/07/2017  2:00 PM     Urethral Catheter [name] Non-latex 16 Fr. (Active)  Indication for Insertion or Continuance of Catheter Acute urinary retention 03/08/2017  8:00 AM  Site Assessment Clean;Intact 03/08/2017  8:00 AM  Catheter Maintenance Bag below level of bladder;Catheter secured;Drainage bag/tubing not touching floor;Insertion date on drainage bag;Seal intact;No dependent loops;Bag emptied prior to transport 03/08/2017  8:00 AM  Collection Container Standard drainage bag 03/08/2017  8:00 AM  Securement Method Leg strap 03/08/2017  8:00 AM  Urinary Catheter Interventions Unclamped 03/08/2017  8:00 AM  Output (mL) 240 mL 03/08/2017  10:00 AM    Microbiology/Sepsis markers: Results for orders placed or performed during the hospital encounter of 02/07/17  MRSA PCR Screening     Status: None   Collection Time: 02/08/17  4:08 AM  Result Value Ref Range Status   MRSA by PCR NEGATIVE NEGATIVE Final    Comment:        The GeneXpert MRSA Assay (FDA approved for NASAL specimens only), is one component of a comprehensive MRSA colonization surveillance program. It is not intended to diagnose MRSA infection nor to guide or monitor treatment for MRSA infections.   Culture, respiratory (NON-Expectorated)     Status: None   Collection Time: 02/10/17 10:59 AM  Result Value Ref Range Status   Specimen Description TRACHEAL ASPIRATE  Final   Special Requests Normal  Final   Gram Stain   Final    ABUNDANT WBC PRESENT, PREDOMINANTLY PMN RARE SQUAMOUS EPITHELIAL CELLS PRESENT ABUNDANT GRAM NEGATIVE COCCI IN PAIRS ABUNDANT GRAM NEGATIVE RODS MODERATE GRAM POSITIVE COCCI IN CHAINS    Culture ABUNDANT Consistent with normal respiratory flora.  Final   Report Status 02/12/2017 FINAL  Final  Culture, blood (Routine X 2) w Reflex to ID Panel     Status: Abnormal   Collection Time: 02/11/17  3:25 PM  Result Value Ref Range Status   Specimen Description BLOOD RIGHT HAND  Final   Special Requests   Final    BOTTLES DRAWN AEROBIC ONLY Blood Culture adequate volume   Culture  Setup Time   Final    GRAM NEGATIVE RODS AEROBIC BOTTLE ONLY CRITICAL VALUE NOTED.  VALUE IS CONSISTENT  WITH PREVIOUSLY REPORTED AND CALLED VALUE.    Culture (A)  Final    KLEBSIELLA OXYTOCA SUSCEPTIBILITIES PERFORMED ON PREVIOUS CULTURE WITHIN THE LAST 5 DAYS.    Report Status 02/17/2017 FINAL  Final  Culture, blood (Routine X 2) w Reflex to ID Panel     Status: Abnormal   Collection Time: 02/11/17  3:25 PM  Result Value Ref Range Status   Specimen Description BLOOD RIGHT HAND  Final   Special Requests   Final    BOTTLES DRAWN AEROBIC ONLY Blood  Culture adequate volume   Culture  Setup Time   Final    GRAM NEGATIVE RODS AEROBIC BOTTLE ONLY CRITICAL RESULT CALLED TO, READ BACK BY AND VERIFIED WITH: J.LEDFORD, PHARMD 02/15/17 0632 L.CHAMPION    Culture KLEBSIELLA OXYTOCA (A)  Final   Report Status 02/17/2017 FINAL  Final   Organism ID, Bacteria KLEBSIELLA OXYTOCA  Final      Susceptibility   Klebsiella oxytoca - MIC*    AMPICILLIN >=32 RESISTANT Resistant     CEFAZOLIN >=64 RESISTANT Resistant     CEFEPIME <=1 SENSITIVE Sensitive     CEFTAZIDIME <=1 SENSITIVE Sensitive     CEFTRIAXONE <=1 SENSITIVE Sensitive     CIPROFLOXACIN <=0.25 SENSITIVE Sensitive     GENTAMICIN <=1 SENSITIVE Sensitive     IMIPENEM <=0.25 SENSITIVE Sensitive     TRIMETH/SULFA <=20 SENSITIVE Sensitive     AMPICILLIN/SULBACTAM 8 SENSITIVE Sensitive     PIP/TAZO <=4 SENSITIVE Sensitive     Extended ESBL NEGATIVE Sensitive     * KLEBSIELLA OXYTOCA  Blood Culture ID Panel (Reflexed)     Status: Abnormal   Collection Time: 02/11/17  3:25 PM  Result Value Ref Range Status   Enterococcus species NOT DETECTED NOT DETECTED Final   Listeria monocytogenes NOT DETECTED NOT DETECTED Final   Staphylococcus species NOT DETECTED NOT DETECTED Final   Staphylococcus aureus NOT DETECTED NOT DETECTED Final   Streptococcus species NOT DETECTED NOT DETECTED Final   Streptococcus agalactiae NOT DETECTED NOT DETECTED Final   Streptococcus pneumoniae NOT DETECTED NOT DETECTED Final   Streptococcus pyogenes NOT DETECTED NOT DETECTED Final   Acinetobacter baumannii NOT DETECTED NOT DETECTED Final   Enterobacteriaceae species DETECTED (A) NOT DETECTED Final    Comment: Enterobacteriaceae represent a large family of gram-negative bacteria, not a single organism. CRITICAL RESULT CALLED TO, READ BACK BY AND VERIFIED WITH: J.LEDFORD, PHARMD 02/15/17 0632 L.CHAMPION    Enterobacter cloacae complex NOT DETECTED NOT DETECTED Final   Escherichia coli NOT DETECTED NOT DETECTED Final    Klebsiella oxytoca DETECTED (A) NOT DETECTED Final    Comment: CRITICAL RESULT CALLED TO, READ BACK BY AND VERIFIED WITH: J.LEDFORD, PHARMD 02/15/17 0632 L.CHAMPION    Klebsiella pneumoniae NOT DETECTED NOT DETECTED Final   Proteus species NOT DETECTED NOT DETECTED Final   Serratia marcescens NOT DETECTED NOT DETECTED Final   Carbapenem resistance NOT DETECTED NOT DETECTED Final   Haemophilus influenzae NOT DETECTED NOT DETECTED Final   Neisseria meningitidis NOT DETECTED NOT DETECTED Final   Pseudomonas aeruginosa NOT DETECTED NOT DETECTED Final   Candida albicans NOT DETECTED NOT DETECTED Final   Candida glabrata NOT DETECTED NOT DETECTED Final   Candida krusei NOT DETECTED NOT DETECTED Final   Candida parapsilosis NOT DETECTED NOT DETECTED Final   Candida tropicalis NOT DETECTED NOT DETECTED Final  Surgical PCR screen     Status: None   Collection Time: 02/13/17  6:30 AM  Result Value Ref Range Status  MRSA, PCR NEGATIVE NEGATIVE Final   Staphylococcus aureus NEGATIVE NEGATIVE Final    Comment:        The Xpert SA Assay (FDA approved for NASAL specimens in patients over 43 years of age), is one component of a comprehensive surveillance program.  Test performance has been validated by [facility] for patients greater than or equal to 22 year old. It is not intended to diagnose infection nor to guide or monitor treatment.   Culture, blood (Routine X 2) w Reflex to ID Panel     Status: None   Collection Time: 02/18/17  2:52 PM  Result Value Ref Range Status   Specimen Description BLOOD RIGHT HAND  Final   Special Requests IN PEDIATRIC BOTTLE Blood Culture adequate volume  Final   Culture NO GROWTH 5 DAYS  Final   Report Status 02/23/2017 FINAL  Final  Culture, blood (Routine X 2) w Reflex to ID Panel     Status: Abnormal   Collection Time: 02/18/17  2:55 PM  Result Value Ref Range Status   Specimen Description BLOOD RIGHT WRIST  Final   Special Requests IN PEDIATRIC  BOTTLE Blood Culture adequate volume  Final   Culture  Setup Time   Final    IN PEDIATRIC BOTTLE GRAM POSITIVE RODS CRITICAL RESULT CALLED TO, READ BACK BY AND VERIFIED WITH: L FOLTANSKI,PHARMD AT 0730 02/19/17 BY L BENFIELD    Culture (A)  Final    BACILLUS SPECIES Standardized susceptibility testing for this organism is not available. ENTEROCOCCUS DETECTED ON BCID, NOT RECOVERED IN CULTURE    Report Status 02/21/2017 FINAL  Final  Blood Culture ID Panel (Reflexed)     Status: Abnormal   Collection Time: 02/18/17  2:55 PM  Result Value Ref Range Status   Enterococcus species DETECTED (A) NOT DETECTED Final    Comment: CRITICAL RESULT CALLED TO, READ BACK BY AND VERIFIED WITH: L FOLTANSKI,PHARMD AT 0730 02/19/17 BY L BENFIELD    Vancomycin resistance NOT DETECTED NOT DETECTED Final   Listeria monocytogenes NOT DETECTED NOT DETECTED Final   Staphylococcus species NOT DETECTED NOT DETECTED Final   Staphylococcus aureus NOT DETECTED NOT DETECTED Final   Streptococcus species NOT DETECTED NOT DETECTED Final   Streptococcus agalactiae NOT DETECTED NOT DETECTED Final   Streptococcus pneumoniae NOT DETECTED NOT DETECTED Final   Streptococcus pyogenes NOT DETECTED NOT DETECTED Final   Acinetobacter baumannii NOT DETECTED NOT DETECTED Final   Enterobacteriaceae species NOT DETECTED NOT DETECTED Final   Enterobacter cloacae complex NOT DETECTED NOT DETECTED Final   Escherichia coli NOT DETECTED NOT DETECTED Final   Klebsiella oxytoca NOT DETECTED NOT DETECTED Final   Klebsiella pneumoniae NOT DETECTED NOT DETECTED Final   Proteus species NOT DETECTED NOT DETECTED Final   Serratia marcescens NOT DETECTED NOT DETECTED Final   Haemophilus influenzae NOT DETECTED NOT DETECTED Final   Neisseria meningitidis NOT DETECTED NOT DETECTED Final   Pseudomonas aeruginosa NOT DETECTED NOT DETECTED Final   Candida albicans NOT DETECTED NOT DETECTED Final   Candida glabrata NOT DETECTED NOT DETECTED Final    Candida krusei NOT DETECTED NOT DETECTED Final   Candida parapsilosis NOT DETECTED NOT DETECTED Final   Candida tropicalis NOT DETECTED NOT DETECTED Final  Culture, blood (routine x 2)     Status: None   Collection Time: 02/20/17  4:35 AM  Result Value Ref Range Status   Specimen Description BLOOD RIGHT HAND  Final   Special Requests   Final    BOTTLES  DRAWN AEROBIC AND ANAEROBIC Blood Culture adequate volume   Culture NO GROWTH 5 DAYS  Final   Report Status 02/25/2017 FINAL  Final  Culture, blood (routine x 2)     Status: None   Collection Time: 02/20/17  4:35 AM  Result Value Ref Range Status   Specimen Description BLOOD RIGHT HAND  Final   Special Requests   Final    BOTTLES DRAWN AEROBIC AND ANAEROBIC Blood Culture adequate volume   Culture NO GROWTH 5 DAYS  Final   Report Status 02/25/2017 FINAL  Final    Anti-infectives:  Anti-infectives    Start     Dose/Rate Route Frequency Ordered Stop   03/06/17 1600  ceFAZolin (ANCEF) IVPB 1 g/50 mL premix     1 g 100 mL/hr over 30 Minutes Intravenous Every 8 hours 03/06/17 1511 03/09/17 1359   02/19/17 2200  vancomycin (VANCOCIN) IVPB 750 mg/150 ml premix  Status:  Discontinued     750 mg 150 mL/hr over 60 Minutes Intravenous Every 12 hours 02/19/17 0807 02/20/17 1150   02/19/17 0900  vancomycin (VANCOCIN) 2,000 mg in sodium chloride 0.9 % 500 mL IVPB     2,000 mg 250 mL/hr over 120 Minutes Intravenous  Once 02/19/17 0807 02/19/17 1151   02/17/17 1100  cefTRIAXone (ROCEPHIN) 2 g in dextrose 5 % 50 mL IVPB  Status:  Discontinued     2 g 100 mL/hr over 30 Minutes Intravenous Every 24 hours 02/17/17 1041 02/19/17 0801   02/14/17 1500  piperacillin-tazobactam (ZOSYN) IVPB 3.375 g  Status:  Discontinued     3.375 g 12.5 mL/hr over 240 Minutes Intravenous Every 8 hours 02/14/17 1351 02/17/17 1041   02/11/17 1000  piperacillin-tazobactam (ZOSYN) IVPB 3.375 g  Status:  Discontinued     3.375 g 12.5 mL/hr over 240 Minutes Intravenous  Every 8 hours 02/11/17 0858 02/14/17 1351   02/11/17 1000  vancomycin (VANCOCIN) IVPB 750 mg/150 ml premix  Status:  Discontinued     750 mg 150 mL/hr over 60 Minutes Intravenous Every 12 hours 02/11/17 0858 02/14/17 0844   02/07/17 2000  ceFAZolin (ANCEF) IVPB 1 g/50 mL premix     1 g 100 mL/hr over 30 Minutes Intravenous Every 8 hours 02/07/17 1416 02/08/17 1543      Best Practice/Protocols:  VTE Prophylaxis: Lovenox (prophylaxtic dose) and Mechanical GI Prophylaxis: Proton Pump Inhibitor Continous Sedation  Consults: Treatment Team:  Altamese Baltic, MD    Events:  Subjective:    Overnight Issues: Patient without issues overnight.  No distress  Objective:  Vital signs for last 24 hours: Temp:  [99 F (37.2 C)-100 F (37.8 C)] 99.6 F (37.6 C) (09/19 0800) Pulse Rate:  [80-96] 90 (09/19 1141) Resp:  [17-26] 22 (09/19 1141) BP: (133-157)/(71-95) 150/79 (09/19 1141) SpO2:  [98 %-100 %] 100 % (09/19 1141) FiO2 (%):  [30 %] 30 % (09/19 1141) Weight:  [98 kg (216 lb 0.8 oz)] 98 kg (216 lb 0.8 oz) (09/19 0400)  Hemodynamic parameters for last 24 hours:    Intake/Output from previous day: 09/18 0701 - 09/19 0700 In: 2810 [I.V.:1200; NG/GT:1560; IV Piggyback:50] Out: 2580 [Urine:2380; Stool:200]  Intake/Output this shift: Total I/O In: 345 [I.V.:150; NG/GT:195] Out: 500 [Urine:500]  Vent settings for last 24 hours: Vent Mode: CPAP;PSV FiO2 (%):  [30 %] 30 % Set Rate:  [20 bmp] 20 bmp Vt Set:  [580 mL] 580 mL PEEP:  [5 cmH20] 5 cmH20 Pressure Support:  [12 cmH20-15 cmH20] [place]  Pressure:  [22 cmH20-25 cmH20] 24 cmH20  Physical Exam:  General: alert and no respiratory distress Neuro: alert, oriented and nonfocal exam Resp: rhonchi bilaterally and left side greater than the right. CVS: regular rate and rhythm, S1, S2 normal, no murmur, click, rub or gallop GI: soft, nontender, BS WNL, no r/g and toleratign tube feedings. well.  Redness around the  G-tube site is better, but the nurse said that there was more drainage around the G-tube.  [place] fine to me and was not draining when I put pressure around it. Extremities: edema 2+ and Has a lot of penile edema also.  Results for orders placed or performed during the hospital encounter of 02/07/17 (from the past 24 hour(s))  Glucose, capillary     Status: Abnormal   Collection Time: 03/07/17  3:42 PM  Result Value Ref Range   Glucose-Capillary 136 (H) 65 - 99 mg/dL   Comment 1 Notify RN    Comment 2 Document in Chart   Glucose, capillary     Status: Abnormal   Collection Time: 03/07/17  8:10 PM  Result Value Ref Range   Glucose-Capillary 128 (H) 65 - 99 mg/dL  Glucose, capillary     Status: Abnormal   Collection Time: 03/08/17 12:00 AM  Result Value Ref Range   Glucose-Capillary 133 (H) 65 - 99 mg/dL  Glucose, capillary     Status: None   Collection Time: 03/08/17  4:26 AM  Result Value Ref Range   Glucose-Capillary 87 65 - 99 mg/dL  CBC with Differential/Platelet     Status: Abnormal   Collection Time: 03/08/17  5:23 AM  Result Value Ref Range   WBC 11.3 (H) 4.0 - 10.5 K/uL   RBC 2.53 (L) 4.22 - 5.81 MIL/uL   Hemoglobin 7.7 (L) 13.0 - 17.0 g/dL   HCT 22.9 (L) 39.0 - 52.0 %   MCV 90.5 78.0 - 100.0 fL   MCH 30.4 26.0 - 34.0 pg   MCHC 33.6 30.0 - 36.0 g/dL   RDW 15.2 11.5 - 15.5 %   Platelets 525 (H) 150 - 400 K/uL   Neutrophils Relative % 66 %   Neutro Abs 7.5 1.7 - 7.7 K/uL   Lymphocytes Relative 12 %   Lymphs Abs 1.3 0.7 - 4.0 K/uL   Monocytes Relative 17 %   Monocytes Absolute 1.9 (H) 0.1 - 1.0 K/uL   Eosinophils Relative 5 %   Eosinophils Absolute 0.5 0.0 - 0.7 K/uL   Basophils Relative 0 %   Basophils Absolute 0.0 0.0 - 0.1 K/uL  Glucose, capillary     Status: Abnormal   Collection Time: 03/08/17  8:29 AM  Result Value Ref Range   Glucose-Capillary 137 (H) 65 - 99 mg/dL   Comment 1 Notify RN    Comment 2 Document in Chart   Glucose, capillary     Status:  Abnormal   Collection Time: 03/08/17 11:48 AM  Result Value Ref Range   Glucose-Capillary 135 (H) 65 - 99 mg/dL   Comment 1 Notify RN    Comment 2 Document in Chart      Assessment/Plan:   NEURO  Altered Mental Status:  alert and occasionally disoriented.   Plan: CPM  PULM  Improving and starting to wean a bit.   Plan: Continue weaning until we can get to trach collar.  No longe having problems with tracheostomy leakage.  CARDIO  No specific issues   Plan: CPM  RENAL  Urine output and renal lfunctiion are  good.   Plan: CPM  GI  No specific problems.  Tolerating tube feedings.   Plan: CPM.  G-tube site okay.  ID  G-tube site superficial infection.   Plan: Continue antibiotics  HEME  No labs today.     Plan: Will recheck labs tomorrow.  ENDO No specific issues   Plan: CPM  Global Issues  Attempting to wean to trach collar.  Looks much morre comfortable today.     LOS: 29 days   Additional comments:I have discussed and reviewed with family members patient's sister  Critical Care Total Time*: 31 minutes  Guida Asman 03/08/2017  *Care during the described time interval was provided by me and/or other providers on the critical care team.  I have reviewed this patient's available data, including medical history, events of note, physical examination and test results as part of my evaluation.

## 2017-03-08 NOTE — Clinical Social Work Note (Signed)
Clinical Social Worker received a phone call from an attorney Public affairs consultant [phone]) in regards to patient care.  Oneta Rack is requesting documentation from hospital that states patient is "incompetent."  CSW kindly explained that a formal psychiatry evaluation would need to be done in order to establish this.  Attorney states that this is needed due to current pursuit of guardianship on patient behalf.  CSW once again, relayed that without a release no information could be provided over the phone but once a release is completed we would be happy to further discuss and potentially send medical records.  Attorney Manger verbalized understanding and appreciation for CSW engagement in conversation.  CSW remains available for support as needed.  Barbette Or, [place]
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
## 2017-03-09 ENCOUNTER — Encounter (HOSPITAL_COMMUNITY): Payer: Self-pay

## 2017-03-09 LAB — BASIC METABOLIC PANEL
ANION GAP: 5 (ref 5–15)
BUN: 28 mg/dL — AB (ref 6–20)
CALCIUM: 7.8 mg/dL — AB (ref 8.9–10.3)
CO2: 25 mmol/L (ref 22–32)
Chloride: 104 mmol/L (ref 101–111)
Creatinine, Ser: 0.79 mg/dL (ref 0.61–1.24)
GFR calc Af Amer: >60 mL/min (ref >60)
Glucose, Bld: 129 mg/dL — ABNORMAL HIGH (ref 65–99)
POTASSIUM: 4.9 mmol/L (ref 3.5–5.1)
SODIUM: 134 mmol/L — AB (ref 135–145)

## 2017-03-09 LAB — CBC WITH DIFFERENTIAL/PLATELET
BASOS ABS: 0 10*3/uL (ref 0.0–0.1)
BASOS PCT: 0 %
EOS ABS: 0.5 10*3/uL (ref 0.0–0.7)
EOS PCT: 5 %
HCT: 23.4 % — ABNORMAL LOW (ref 39.0–52.0)
Hemoglobin: 7.5 g/dL — ABNORMAL LOW (ref 13.0–17.0)
Lymphocytes Relative: 14 %
Lymphs Abs: 1.4 10*3/uL (ref 0.7–4.0)
MCH: 29.4 pg (ref 26.0–34.0)
MCHC: 32.1 g/dL (ref 30.0–36.0)
MCV: 91.8 fL (ref 78.0–100.0)
Monocytes Absolute: 1.5 10*3/uL — ABNORMAL HIGH (ref 0.1–1.0)
Monocytes Relative: 15 %
Neutro Abs: 6.5 10*3/uL (ref 1.7–7.7)
Neutrophils Relative %: 66 %
PLATELETS: 510 10*3/uL — AB (ref 150–400)
RBC: 2.55 MIL/uL — AB (ref 4.22–5.81)
RDW: 15.1 % (ref 11.5–15.5)
WBC: 9.9 10*3/uL (ref 4.0–10.5)

## 2017-03-09 LAB — GLUCOSE, CAPILLARY
GLUCOSE-CAPILLARY: 112 mg/dL — AB (ref 65–99)
GLUCOSE-CAPILLARY: 126 mg/dL — AB (ref 65–99)
GLUCOSE-CAPILLARY: 131 mg/dL — AB (ref 65–99)
GLUCOSE-CAPILLARY: 143 mg/dL — AB (ref 65–99)
Glucose-Capillary: 148 mg/dL — ABNORMAL HIGH (ref 65–99)
Glucose-Capillary: 117 mg/dL — ABNORMAL HIGH (ref 65–99)

## 2017-03-09 NOTE — Progress Notes (Signed)
Walked by patients room and patient Spo2 83% and very labored breathing. RT placed patient on vent on full support.
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
## 2017-03-10 ENCOUNTER — Inpatient Hospital Stay (HOSPITAL_COMMUNITY): Payer: BLUE CROSS/BLUE SHIELD

## 2017-03-10 LAB — GLUCOSE, CAPILLARY
GLUCOSE-CAPILLARY: 141 mg/dL — AB (ref 65–99)
GLUCOSE-CAPILLARY: 109 mg/dL — AB (ref 65–99)
GLUCOSE-CAPILLARY: 171 mg/dL — AB (ref 65–99)
Glucose-Capillary: 178 mg/dL — ABNORMAL HIGH (ref 65–99)
Glucose-Capillary: 158 mg/dL — ABNORMAL HIGH (ref 65–99)

## 2017-03-10 LAB — BASIC METABOLIC PANEL
Anion gap: 4 — ABNORMAL LOW (ref 5–15)
BUN: 26 mg/dL — AB (ref 6–20)
CO2: 26 mmol/L (ref 22–32)
CREATININE: 0.78 mg/dL (ref 0.61–1.24)
Calcium: 8.2 mg/dL — ABNORMAL LOW (ref 8.9–10.3)
Chloride: 107 mmol/L (ref 101–111)
Glucose, Bld: 127 mg/dL — ABNORMAL HIGH (ref 65–99)
POTASSIUM: 5.2 mmol/L — AB (ref 3.5–5.1)
SODIUM: 137 mmol/L (ref 135–145)

## 2017-03-10 LAB — CBC
HCT: 24.3 % — ABNORMAL LOW (ref 39.0–52.0)
Hemoglobin: 7.7 g/dL — ABNORMAL LOW (ref 13.0–17.0)
MCH: 29.5 pg (ref 26.0–34.0)
MCHC: 31.7 g/dL (ref 30.0–36.0)
MCV: 93.1 fL (ref 78.0–100.0)
PLATELETS: 598 10*3/uL — AB (ref 150–400)
RBC: 2.61 MIL/uL — AB (ref 4.22–5.81)
RDW: 14.8 % (ref 11.5–15.5)
WBC: 11.5 10*3/uL — ABNORMAL HIGH (ref 4.0–10.5)

## 2017-03-10 MED ORDER — ATROPINE SULFATE 1 MG/10ML IJ SOSY
PREFILLED_SYRINGE | INTRAMUSCULAR | Status: AC
  Administered 2017-03-10: 1 mg
  Filled 2017-03-10: qty 10

## 2017-03-10 NOTE — Code Documentation (Addendum)
  Patient Name: Peter [name]   MRN: [id number]   Date of Birth/ Sex: 05-14-1961 , male      Admission Date: 02/07/2017  Attending Provider: Md, Trauma, MD  Primary Diagnosis: <principal problem not specified>   Indication: Pt was in his usual state of health until this PM, when he was noted to be bradycardic. Code blue was subsequently called. At the time of arrival on scene, ACLS protocol was underway.   Technical Description:  - CPR performance duration:  4 minutes  - Was defibrillation or cardioversion used? No   - Was external pacer placed? No  - Was patient intubated pre/post CPR? No   Medications Administered: Y = Yes; Blank = No Amiodarone    Atropine    Calcium    Epinephrine    Lidocaine    Magnesium    Norepinephrine    Phenylephrine    Sodium bicarbonate    Vasopressin     Post CPR evaluation:  - Final Status - Was patient successfully resuscitated ? Yes - What is current rhythm? Sinus  - What is current hemodynamic status? 193/88, pulse=128 at 22:48  Miscellaneous Information:  - Labs sent, including: EKG, ABG, Troponin, Chest x-ray  - Primary team notified?  Yes  - Family Notified? Yes  - Additional notes/ transfer status: Patient's sister was concerned about concomitant administration of klonopin, metoprolol, and seroquel prior to the event      Lars Mage, MD  03/10/2017, 11:56 PM
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
## 2017-03-10 NOTE — Progress Notes (Signed)
Trauma Service Note  Subjective: Patient on trach collar this AM.  RR 34.  Oxygenation good.  Objective: Vital signs in last 24 hours: Temp:  [98.5 F (36.9 C)-98.8 F (37.1 C)] 98.7 F (37.1 C) (09/21 0800) Pulse Rate:  [79-116] 116 (09/21 0900) Resp:  [16-38] 38 (09/21 0900) BP: (115-152)/(66-89) 149/75 (09/21 0900) SpO2:  [87 %-100 %] 96 % (09/21 0900) FiO2 (%):  [30 %-40 %] 40 % (09/21 0800) Weight:  [98.4 kg (216 lb 14.9 oz)] 98.4 kg (216 lb 14.9 oz) (09/21 0500) Last BM Date: 03/09/17  Intake/Output from previous day: 09/20 0701 - 09/21 0700 In: 3055 [I.V.:1160; NG/GT:1895] Out: 3100 [Urine:3000; Stool:100] Intake/Output this shift: Total I/O In: 230 [I.V.:100; NG/GT:130] Out: 450 [Urine:350; Stool:100]  General: No distress.  Looks completley tired  Lungs: Clear.  Abd: Soft, good bowel sounds.  Site is okay.  Extremities: No changes  Neuro: Intac.  Lab Results: CBC   Recent Labs  03/08/17 0523 03/09/17 0500  WBC 11.3* 9.9  HGB 7.7* 7.5*  HCT 22.9* 23.4*  PLT 525* 510*   BMET  Recent Labs  03/09/17 0500  NA 134*  K 4.9  CL 104  CO2 25  GLUCOSE 129*  BUN 28*  CREATININE 0.79  CALCIUM 7.8*   PT/INR No results for input(s): LABPROT, INR in the last 72 hours. ABG No results for input(s): PHART, HCO3 in the last 72 hours.  Invalid input(s): PCO2, PO2  Studies/Results: No results found.  Anti-infectives: Anti-infectives    Start     Dose/Rate Route Frequency Ordered Stop   03/06/17 1600  ceFAZolin (ANCEF) IVPB 1 g/50 mL premix     1 g 100 mL/hr over 30 Minutes Intravenous Every 8 hours 03/06/17 1511 03/09/17 0629   02/19/17 2200  vancomycin (VANCOCIN) IVPB 750 mg/150 ml premix  Status:  Discontinued     750 mg 150 mL/hr over 60 Minutes Intravenous Every 12 hours 02/19/17 0807 02/20/17 1150   02/19/17 0900  vancomycin (VANCOCIN) 2,000 mg in sodium chloride 0.9 % 500 mL IVPB     2,000 mg 250 mL/hr over 120 Minutes Intravenous  Once  02/19/17 0807 02/19/17 1151   02/17/17 1100  cefTRIAXone (ROCEPHIN) 2 g in dextrose 5 % 50 mL IVPB  Status:  Discontinued     2 g 100 mL/hr over 30 Minutes Intravenous Every 24 hours 02/17/17 1041 02/19/17 0801   02/14/17 1500  piperacillin-tazobactam (ZOSYN) IVPB 3.375 g  Status:  Discontinued     3.375 g 12.5 mL/hr over 240 Minutes Intravenous Every 8 hours 02/14/17 1351 02/17/17 1041   02/11/17 1000  piperacillin-tazobactam (ZOSYN) IVPB 3.375 g  Status:  Discontinued     3.375 g 12.5 mL/hr over 240 Minutes Intravenous Every 8 hours 02/11/17 0858 02/14/17 1351   02/11/17 1000  vancomycin (VANCOCIN) IVPB 750 mg/150 ml premix  Status:  Discontinued     750 mg 150 mL/hr over 60 Minutes Intravenous Every 12 hours 02/11/17 0858 02/14/17 0844   02/07/17 2000  ceFAZolin (ANCEF) IVPB 1 g/50 mL premix     1 g 100 mL/hr over 30 Minutes Intravenous Every 8 hours 02/07/17 1416 02/08/17 1543      Assessment/Plan: s/p Procedure(s): PERCUTANEOUS TRACHEOSTOMY AT BEDSIDE Try to keep on trach collar as long as possible  Recheck labs tomorrow  LOS: 31 days   Kathryne Eriksson. Dahlia Bailiff, MD, FACS [phone] Trauma Surgeon 03/10/2017

## 2017-03-10 NOTE — Progress Notes (Signed)
RT called to come to patients room for O2 desaturation. On way, code blue activated. RN bagging patient when RT arrived, RT took over bagging. Pulse back when RT arrived. Patient hard to bag. RT inline suctioned back and removed small tan thick secretions. Patient placed back on ventilator full support. Patient lavaged and suctioned to remove small tan secretions again. RT will continue to monitor.

## 2017-03-10 NOTE — Progress Notes (Signed)
Attempted to remove pelvic sutures, skin too taught at this time to safely remove. Will reevaluate and continue to monitor.   Prescilla Sours, Therapist, sports

## 2017-03-10 NOTE — Progress Notes (Addendum)
[facility] Progress Note Patient Name: Peter [name] DOB: 1961-04-25 MRN: [id number]   Date of Service  03/10/2017  HPI/Events of Note  Code Blue Note: Patient found pulseless by nursing staff. Had been on [employer] all day. Short duration CPR with EPI --> ROSC. BP = 194/88. Patient isn't on PCCM service. Patient is a Trauma service patient.   eICU Interventions  Will order:  1. Place back on mechanical ventilation. 30%/PRVC 20/TV 580/P 5.  2. ABG at 12 midnight. 3. 12 Lead EKG now. 4. Cycle Troponin. 5. Further management per Trauma service.      Intervention Category Major Interventions: Code management / supervision  Lysle Dingwall 03/10/2017, 10:47 PM

## 2017-03-11 ENCOUNTER — Inpatient Hospital Stay (HOSPITAL_COMMUNITY): Payer: BLUE CROSS/BLUE SHIELD

## 2017-03-11 LAB — TROPONIN I
Troponin I: <0.03 ng/mL (ref <0.03)
Troponin I: <0.03 ng/mL (ref <0.03)

## 2017-03-11 LAB — BLOOD GAS, ARTERIAL
ACID-BASE EXCESS: 1.6 mmol/L (ref 0.0–2.0)
BICARBONATE: 25.6 mmol/L (ref 20.0–28.0)
Drawn by: 252031
FIO2: 100
O2 Saturation: 99.7 %
PEEP/CPAP: 5 cmH2O
PH ART: 7.419 (ref 7.350–7.450)
Patient temperature: 98.6
RATE: 20 resp/min
VT: 580 mL
pCO2 arterial: 40.4 mmHg (ref 32.0–48.0)
pO2, Arterial: 318 mmHg — ABNORMAL HIGH (ref 83.0–108.0)

## 2017-03-11 LAB — BASIC METABOLIC PANEL
Anion gap: 6 (ref 5–15)
BUN: 29 mg/dL — ABNORMAL HIGH (ref 6–20)
CHLORIDE: 105 mmol/L (ref 101–111)
CO2: 25 mmol/L (ref 22–32)
CREATININE: 1.84 mg/dL — AB (ref 0.61–1.24)
Calcium: 8.1 mg/dL — ABNORMAL LOW (ref 8.9–10.3)
GFR calc non Af Amer: 39 mL/min — ABNORMAL LOW (ref >60)
GFR, EST AFRICAN AMERICAN: 46 mL/min — AB (ref >60)
GLUCOSE: 97 mg/dL (ref 65–99)
Potassium: 5 mmol/L (ref 3.5–5.1)
Sodium: 136 mmol/L (ref 135–145)

## 2017-03-11 LAB — GLUCOSE, CAPILLARY
GLUCOSE-CAPILLARY: 102 mg/dL — AB (ref 65–99)
GLUCOSE-CAPILLARY: 137 mg/dL — AB (ref 65–99)
GLUCOSE-CAPILLARY: 141 mg/dL — AB (ref 65–99)
Glucose-Capillary: 115 mg/dL — ABNORMAL HIGH (ref 65–99)
Glucose-Capillary: 151 mg/dL — ABNORMAL HIGH (ref 65–99)
Glucose-Capillary: 151 mg/dL — ABNORMAL HIGH (ref 65–99)
Glucose-Capillary: 164 mg/dL — ABNORMAL HIGH (ref 65–99)

## 2017-03-11 LAB — CBC WITH DIFFERENTIAL/PLATELET
BASOS ABS: 0.1 10*3/uL (ref 0.0–0.1)
Basophils Relative: 1 %
EOS ABS: 0.5 10*3/uL (ref 0.0–0.7)
EOS PCT: 5 %
HCT: 22.6 % — ABNORMAL LOW (ref 39.0–52.0)
Hemoglobin: 7.1 g/dL — ABNORMAL LOW (ref 13.0–17.0)
LYMPHS ABS: 1.5 10*3/uL (ref 0.7–4.0)
LYMPHS PCT: 14 %
MCH: 29.2 pg (ref 26.0–34.0)
MCHC: 31.4 g/dL (ref 30.0–36.0)
MCV: 93 fL (ref 78.0–100.0)
MONO ABS: 1.6 10*3/uL — AB (ref 0.1–1.0)
Monocytes Relative: 14 %
Neutro Abs: 7.4 10*3/uL (ref 1.7–7.7)
Neutrophils Relative %: 66 %
PLATELETS: 538 10*3/uL — AB (ref 150–400)
RBC: 2.43 MIL/uL — ABNORMAL LOW (ref 4.22–5.81)
RDW: 14.6 % (ref 11.5–15.5)
WBC: 11 10*3/uL — ABNORMAL HIGH (ref 4.0–10.5)

## 2017-03-11 MED ORDER — QUETIAPINE FUMARATE 100 MG PO TABS
100.0000 mg | ORAL_TABLET | Freq: Every day | ORAL | Status: DC
  Administered 2017-03-11 – 2017-03-22 (×12): 100 mg
  Filled 2017-03-11 (×12): qty 1

## 2017-03-11 MED ORDER — EPINEPHRINE PF 1 MG/ML IJ SOLN
0.5000 ug/min | INTRAVENOUS | Status: DC
  Filled 2017-03-11: qty 4

## 2017-03-11 MED ORDER — CLONAZEPAM 1 MG PO TABS
1.0000 mg | ORAL_TABLET | Freq: Every day | ORAL | Status: DC
  Administered 2017-03-11 – 2017-03-12 (×2): 1 mg
  Filled 2017-03-11 (×2): qty 1

## 2017-03-11 NOTE — Progress Notes (Signed)
Follow up - Trauma Critical Care  Patient Details:    Peter [name] is an 56 y.o. male.  Lines/tubes : PICC Double Lumen 02/23/17 PICC Right Cephalic 42 cm 0 cm (Active)  Indication for Insertion or Continuance of Line Prolonged intravenous therapies 03/11/2017  8:00 AM  Exposed Catheter (cm) 0 cm 03/07/2017  8:00 AM  Site Assessment Clean;Dry;Intact 03/11/2017  8:00 AM  Lumen #1 Status In-line blood sampling system in place;Infusing 03/11/2017  8:00 AM  Lumen #2 Status Flushed;Infusing 03/11/2017  8:00 AM  Dressing Type Transparent;Occlusive 03/11/2017  8:00 AM  Dressing Status Clean;Dry;Intact;Antimicrobial disc in place 03/11/2017  8:00 AM  Line Care Connections checked and tightened 03/11/2017  8:00 AM  Dressing Intervention Dressing changed;Antimicrobial disc changed;Securement device changed 03/09/2017  5:07 PM  Dressing Change Due 03/16/17 03/11/2017  8:00 AM     Gastrostomy/Enterostomy PEG-jejunostomy (Active)  Surrounding Skin Erythema 03/11/2017  8:00 AM  Tube Status Patent 03/11/2017  8:00 AM  Drainage Appearance Purulent 03/09/2017  4:00 AM  Dressing Status Clean;Dry;Intact 03/11/2017  8:00 AM  Dressing Intervention New dressing 03/10/2017  8:00 PM  Dressing Type Abdominal Binder;Split gauze 03/11/2017  8:00 AM  G Port Intake (mL) 200 ml 03/04/2017 10:00 PM  Output (mL) 50 mL 03/08/2017  6:00 PM     Rectal Tube/Pouch (Active)  Output (mL) 50 mL 03/10/2017 12:00 PM     Urethral Catheter [name] Non-latex 16 Fr. (Active)  Indication for Insertion or Continuance of Catheter Acute urinary retention 03/11/2017  8:00 AM  Site Assessment Clean;Intact;Dry 03/11/2017  8:00 AM  Catheter Maintenance Bag below level of bladder;Catheter secured;Drainage bag/tubing not touching floor;Insertion date on drainage bag;No dependent loops;Seal intact;Bag emptied prior to transport 03/11/2017  8:00 AM  Collection Container Standard drainage bag 03/11/2017  8:00 AM  Securement Method Securing device  (Describe) 03/11/2017  8:00 AM  Urinary Catheter Interventions Unclamped 03/10/2017  8:00 PM  Output (mL) 375 mL 03/11/2017  5:00 AM    Microbiology/Sepsis markers: Results for orders placed or performed during the hospital encounter of 02/07/17  MRSA PCR Screening     Status: None   Collection Time: 02/08/17  4:08 AM  Result Value Ref Range Status   MRSA by PCR NEGATIVE NEGATIVE Final    Comment:        The GeneXpert MRSA Assay (FDA approved for NASAL specimens only), is one component of a comprehensive MRSA colonization surveillance program. It is not intended to diagnose MRSA infection nor to guide or monitor treatment for MRSA infections.   Culture, respiratory (NON-Expectorated)     Status: None   Collection Time: 02/10/17 10:59 AM  Result Value Ref Range Status   Specimen Description TRACHEAL ASPIRATE  Final   Special Requests Normal  Final   Gram Stain   Final    ABUNDANT WBC PRESENT, PREDOMINANTLY PMN RARE SQUAMOUS EPITHELIAL CELLS PRESENT ABUNDANT GRAM NEGATIVE COCCI IN PAIRS ABUNDANT GRAM NEGATIVE RODS MODERATE GRAM POSITIVE COCCI IN CHAINS    Culture ABUNDANT Consistent with normal respiratory flora.  Final   Report Status 02/12/2017 FINAL  Final  Culture, blood (Routine X 2) w Reflex to ID Panel     Status: Abnormal   Collection Time: 02/11/17  3:25 PM  Result Value Ref Range Status   Specimen Description BLOOD RIGHT HAND  Final   Special Requests   Final    BOTTLES DRAWN AEROBIC ONLY Blood Culture adequate volume   Culture  Setup Time   Final    GRAM  NEGATIVE RODS AEROBIC BOTTLE ONLY CRITICAL VALUE NOTED.  VALUE IS CONSISTENT WITH PREVIOUSLY REPORTED AND CALLED VALUE.    Culture (A)  Final    KLEBSIELLA OXYTOCA SUSCEPTIBILITIES PERFORMED ON PREVIOUS CULTURE WITHIN THE LAST 5 DAYS.    Report Status 02/17/2017 FINAL  Final  Culture, blood (Routine X 2) w Reflex to ID Panel     Status: Abnormal   Collection Time: 02/11/17  3:25 PM  Result Value Ref Range  Status   Specimen Description BLOOD RIGHT HAND  Final   Special Requests   Final    BOTTLES DRAWN AEROBIC ONLY Blood Culture adequate volume   Culture  Setup Time   Final    GRAM NEGATIVE RODS AEROBIC BOTTLE ONLY CRITICAL RESULT CALLED TO, READ BACK BY AND VERIFIED WITH: J.LEDFORD, PHARMD 02/15/17 0632 L.CHAMPION    Culture KLEBSIELLA OXYTOCA (A)  Final   Report Status 02/17/2017 FINAL  Final   Organism ID, Bacteria KLEBSIELLA OXYTOCA  Final      Susceptibility   Klebsiella oxytoca - MIC*    AMPICILLIN >=32 RESISTANT Resistant     CEFAZOLIN >=64 RESISTANT Resistant     CEFEPIME <=1 SENSITIVE Sensitive     CEFTAZIDIME <=1 SENSITIVE Sensitive     CEFTRIAXONE <=1 SENSITIVE Sensitive     CIPROFLOXACIN <=0.25 SENSITIVE Sensitive     GENTAMICIN <=1 SENSITIVE Sensitive     IMIPENEM <=0.25 SENSITIVE Sensitive     TRIMETH/SULFA <=20 SENSITIVE Sensitive     AMPICILLIN/SULBACTAM 8 SENSITIVE Sensitive     PIP/TAZO <=4 SENSITIVE Sensitive     Extended ESBL NEGATIVE Sensitive     * KLEBSIELLA OXYTOCA  Blood Culture ID Panel (Reflexed)     Status: Abnormal   Collection Time: 02/11/17  3:25 PM  Result Value Ref Range Status   Enterococcus species NOT DETECTED NOT DETECTED Final   Listeria monocytogenes NOT DETECTED NOT DETECTED Final   Staphylococcus species NOT DETECTED NOT DETECTED Final   Staphylococcus aureus NOT DETECTED NOT DETECTED Final   Streptococcus species NOT DETECTED NOT DETECTED Final   Streptococcus agalactiae NOT DETECTED NOT DETECTED Final   Streptococcus pneumoniae NOT DETECTED NOT DETECTED Final   Streptococcus pyogenes NOT DETECTED NOT DETECTED Final   Acinetobacter baumannii NOT DETECTED NOT DETECTED Final   Enterobacteriaceae species DETECTED (A) NOT DETECTED Final    Comment: Enterobacteriaceae represent a large family of gram-negative bacteria, not a single organism. CRITICAL RESULT CALLED TO, READ BACK BY AND VERIFIED WITH: J.LEDFORD, PHARMD 02/15/17 0632  L.CHAMPION    Enterobacter cloacae complex NOT DETECTED NOT DETECTED Final   Escherichia coli NOT DETECTED NOT DETECTED Final   Klebsiella oxytoca DETECTED (A) NOT DETECTED Final    Comment: CRITICAL RESULT CALLED TO, READ BACK BY AND VERIFIED WITH: J.LEDFORD, PHARMD 02/15/17 0632 L.CHAMPION    Klebsiella pneumoniae NOT DETECTED NOT DETECTED Final   Proteus species NOT DETECTED NOT DETECTED Final   Serratia marcescens NOT DETECTED NOT DETECTED Final   Carbapenem resistance NOT DETECTED NOT DETECTED Final   Haemophilus influenzae NOT DETECTED NOT DETECTED Final   Neisseria meningitidis NOT DETECTED NOT DETECTED Final   Pseudomonas aeruginosa NOT DETECTED NOT DETECTED Final   Candida albicans NOT DETECTED NOT DETECTED Final   Candida glabrata NOT DETECTED NOT DETECTED Final   Candida krusei NOT DETECTED NOT DETECTED Final   Candida parapsilosis NOT DETECTED NOT DETECTED Final   Candida tropicalis NOT DETECTED NOT DETECTED Final  Surgical PCR screen     Status: None   Collection Time:  02/13/17  6:30 AM  Result Value Ref Range Status   MRSA, PCR NEGATIVE NEGATIVE Final   Staphylococcus aureus NEGATIVE NEGATIVE Final    Comment:        The Xpert SA Assay (FDA approved for NASAL specimens in patients over 69 years of age), is one component of a comprehensive surveillance program.  Test performance has been validated by [facility] for patients greater than or equal to 97 year old. It is not intended to diagnose infection nor to guide or monitor treatment.   Culture, blood (Routine X 2) w Reflex to ID Panel     Status: None   Collection Time: 02/18/17  2:52 PM  Result Value Ref Range Status   Specimen Description BLOOD RIGHT HAND  Final   Special Requests IN PEDIATRIC BOTTLE Blood Culture adequate volume  Final   Culture NO GROWTH 5 DAYS  Final   Report Status 02/23/2017 FINAL  Final  Culture, blood (Routine X 2) w Reflex to ID Panel     Status: Abnormal   Collection Time:  02/18/17  2:55 PM  Result Value Ref Range Status   Specimen Description BLOOD RIGHT WRIST  Final   Special Requests IN PEDIATRIC BOTTLE Blood Culture adequate volume  Final   Culture  Setup Time   Final    IN PEDIATRIC BOTTLE GRAM POSITIVE RODS CRITICAL RESULT CALLED TO, READ BACK BY AND VERIFIED WITH: L FOLTANSKI,PHARMD AT 0730 02/19/17 BY L BENFIELD    Culture (A)  Final    BACILLUS SPECIES Standardized susceptibility testing for this organism is not available. ENTEROCOCCUS DETECTED ON BCID, NOT RECOVERED IN CULTURE    Report Status 02/21/2017 FINAL  Final  Blood Culture ID Panel (Reflexed)     Status: Abnormal   Collection Time: 02/18/17  2:55 PM  Result Value Ref Range Status   Enterococcus species DETECTED (A) NOT DETECTED Final    Comment: CRITICAL RESULT CALLED TO, READ BACK BY AND VERIFIED WITH: L FOLTANSKI,PHARMD AT 0730 02/19/17 BY L BENFIELD    Vancomycin resistance NOT DETECTED NOT DETECTED Final   Listeria monocytogenes NOT DETECTED NOT DETECTED Final   Staphylococcus species NOT DETECTED NOT DETECTED Final   Staphylococcus aureus NOT DETECTED NOT DETECTED Final   Streptococcus species NOT DETECTED NOT DETECTED Final   Streptococcus agalactiae NOT DETECTED NOT DETECTED Final   Streptococcus pneumoniae NOT DETECTED NOT DETECTED Final   Streptococcus pyogenes NOT DETECTED NOT DETECTED Final   Acinetobacter baumannii NOT DETECTED NOT DETECTED Final   Enterobacteriaceae species NOT DETECTED NOT DETECTED Final   Enterobacter cloacae complex NOT DETECTED NOT DETECTED Final   Escherichia coli NOT DETECTED NOT DETECTED Final   Klebsiella oxytoca NOT DETECTED NOT DETECTED Final   Klebsiella pneumoniae NOT DETECTED NOT DETECTED Final   Proteus species NOT DETECTED NOT DETECTED Final   Serratia marcescens NOT DETECTED NOT DETECTED Final   Haemophilus influenzae NOT DETECTED NOT DETECTED Final   Neisseria meningitidis NOT DETECTED NOT DETECTED Final   Pseudomonas aeruginosa NOT  DETECTED NOT DETECTED Final   Candida albicans NOT DETECTED NOT DETECTED Final   Candida glabrata NOT DETECTED NOT DETECTED Final   Candida krusei NOT DETECTED NOT DETECTED Final   Candida parapsilosis NOT DETECTED NOT DETECTED Final   Candida tropicalis NOT DETECTED NOT DETECTED Final  Culture, blood (routine x 2)     Status: None   Collection Time: 02/20/17  4:35 AM  Result Value Ref Range Status   Specimen Description BLOOD RIGHT HAND  Final   Special Requests   Final    BOTTLES DRAWN AEROBIC AND ANAEROBIC Blood Culture adequate volume   Culture NO GROWTH 5 DAYS  Final   Report Status 02/25/2017 FINAL  Final  Culture, blood (routine x 2)     Status: None   Collection Time: 02/20/17  4:35 AM  Result Value Ref Range Status   Specimen Description BLOOD RIGHT HAND  Final   Special Requests   Final    BOTTLES DRAWN AEROBIC AND ANAEROBIC Blood Culture adequate volume   Culture NO GROWTH 5 DAYS  Final   Report Status 02/25/2017 FINAL  Final    Anti-infectives:  Anti-infectives    Start     Dose/Rate Route Frequency Ordered Stop   03/06/17 1600  ceFAZolin (ANCEF) IVPB 1 g/50 mL premix     1 g 100 mL/hr over 30 Minutes Intravenous Every 8 hours 03/06/17 1511 03/09/17 0629   02/19/17 2200  vancomycin (VANCOCIN) IVPB 750 mg/150 ml premix  Status:  Discontinued     750 mg 150 mL/hr over 60 Minutes Intravenous Every 12 hours 02/19/17 0807 02/20/17 1150   02/19/17 0900  vancomycin (VANCOCIN) 2,000 mg in sodium chloride 0.9 % 500 mL IVPB     2,000 mg 250 mL/hr over 120 Minutes Intravenous  Once 02/19/17 0807 02/19/17 1151   02/17/17 1100  cefTRIAXone (ROCEPHIN) 2 g in dextrose 5 % 50 mL IVPB  Status:  Discontinued     2 g 100 mL/hr over 30 Minutes Intravenous Every 24 hours 02/17/17 1041 02/19/17 0801   02/14/17 1500  piperacillin-tazobactam (ZOSYN) IVPB 3.375 g  Status:  Discontinued     3.375 g 12.5 mL/hr over 240 Minutes Intravenous Every 8 hours 02/14/17 1351 02/17/17 1041    02/11/17 1000  piperacillin-tazobactam (ZOSYN) IVPB 3.375 g  Status:  Discontinued     3.375 g 12.5 mL/hr over 240 Minutes Intravenous Every 8 hours 02/11/17 0858 02/14/17 1351   02/11/17 1000  vancomycin (VANCOCIN) IVPB 750 mg/150 ml premix  Status:  Discontinued     750 mg 150 mL/hr over 60 Minutes Intravenous Every 12 hours 02/11/17 0858 02/14/17 0844   02/07/17 2000  ceFAZolin (ANCEF) IVPB 1 g/50 mL premix     1 g 100 mL/hr over 30 Minutes Intravenous Every 8 hours 02/07/17 1416 02/08/17 1543      Consults: Treatment Team:  Altamese , MD    Studies:    Events:  Subjective:    Overnight Issues:   Objective:  Vital signs for last 24 hours: Temp:  [96.5 F (35.8 C)-99 F (37.2 C)] 97.5 F (36.4 C) (09/22 0800) Pulse Rate:  [80-126] 94 (09/22 1000) Resp:  [18-34] 23 (09/22 1000) BP: (106-149)/(68-98) 138/82 (09/22 1000) SpO2:  [91 %-100 %] 100 % (09/22 1000) FiO2 (%):  [35 %-100 %] 40 % (09/22 0747) Weight:  [92.5 kg (203 lb 14.8 oz)] 92.5 kg (203 lb 14.8 oz) (09/22 0400)  Hemodynamic parameters for last 24 hours:    Intake/Output from previous day: 09/21 0701 - 09/22 0700 In: 3780 [I.V.:1250; WY/OV:7858; IV Piggyback:105] Out: 2750 [Urine:2550; Stool:200]  Intake/Output this shift: Total I/O In: 345 [I.V.:150; NG/GT:195] Out: -   Vent settings for last 24 hours: Vent Mode: PSV;CPAP FiO2 (%):  [35 %-100 %] 40 % Set Rate:  [20 bmp] 20 bmp Vt Set:  [580 mL] 580 mL PEEP:  [5 cmH20] 5 cmH20 Pressure Support:  [15 cmH20] 15 cmH20 Plateau Pressure:  [6 cmH20-29 cmH20] 29 cmH20  Physical Exam:  General: on vent Neuro: arouses but sedated HEENT/Neck: trach-clean, intact Resp: rhonchi bilaterally CVS: regular rate and rhythm, S1, S2 normal, no murmur, click, rub or gallop GI: soft, NT  Results for orders placed or performed during the hospital encounter of 02/07/17 (from the past 24 hour(s))  Glucose, capillary     Status: Abnormal   Collection Time:  03/10/17 11:36 AM  Result Value Ref Range   Glucose-Capillary 109 (H) 65 - 99 mg/dL  CBC     Status: Abnormal   Collection Time: 03/10/17 12:21 PM  Result Value Ref Range   WBC 11.5 (H) 4.0 - 10.5 K/uL   RBC 2.61 (L) 4.22 - 5.81 MIL/uL   Hemoglobin 7.7 (L) 13.0 - 17.0 g/dL   HCT 24.3 (L) 39.0 - 52.0 %   MCV 93.1 78.0 - 100.0 fL   MCH 29.5 26.0 - 34.0 pg   MCHC 31.7 30.0 - 36.0 g/dL   RDW 14.8 11.5 - 15.5 %   Platelets 598 (H) 150 - 400 K/uL  Basic metabolic panel     Status: Abnormal   Collection Time: 03/10/17 12:21 PM  Result Value Ref Range   Sodium 137 135 - 145 mmol/L   Potassium 5.2 (H) 3.5 - 5.1 mmol/L   Chloride 107 101 - 111 mmol/L   CO2 26 22 - 32 mmol/L   Glucose, Bld 127 (H) 65 - 99 mg/dL   BUN 26 (H) 6 - 20 mg/dL   Creatinine, Ser 0.78 0.61 - 1.24 mg/dL   Calcium 8.2 (L) 8.9 - 10.3 mg/dL   GFR calc non Af Amer >60 >60 mL/min   GFR calc Af Amer >60 >60 mL/min   Anion gap 4 (L) 5 - 15  Glucose, capillary     Status: Abnormal   Collection Time: 03/10/17  3:53 PM  Result Value Ref Range   Glucose-Capillary 171 (H) 65 - 99 mg/dL  Glucose, capillary     Status: Abnormal   Collection Time: 03/10/17  8:33 PM  Result Value Ref Range   Glucose-Capillary 158 (H) 65 - 99 mg/dL  Troponin I     Status: None   Collection Time: 03/10/17 11:24 PM  Result Value Ref Range   Troponin I <0.03 <0.03 ng/mL  Glucose, capillary     Status: Abnormal   Collection Time: 03/11/17 12:07 AM  Result Value Ref Range   Glucose-Capillary 151 (H) 65 - 99 mg/dL  Blood gas, arterial     Status: Abnormal   Collection Time: 03/11/17 12:10 AM  Result Value Ref Range   FIO2 100.00    Delivery systems VENTILATOR    Mode PRESSURE REGULATED VOLUME CONTROL    VT 580 mL   LHR 20 resp/min   Peep/cpap 5.0 cm H20   pH, Arterial 7.419 7.350 - 7.450   pCO2 arterial 40.4 32.0 - 48.0 mmHg   pO2, Arterial 318 (H) 83.0 - 108.0 mmHg   Bicarbonate 25.6 20.0 - 28.0 mmol/L   Acid-Base Excess 1.6 0.0 - 2.0  mmol/L   O2 Saturation 99.7 %   Patient temperature 98.6    Collection site LEFT RADIAL    Drawn by [id number]    Sample type ARTERIAL DRAW   Glucose, capillary     Status: Abnormal   Collection Time: 03/11/17  4:26 AM  Result Value Ref Range   Glucose-Capillary 102 (H) 65 - 99 mg/dL  CBC with Differential/Platelet     Status: Abnormal   Collection Time: 03/11/17  4:52 AM  Result Value Ref Range   WBC 11.0 (H) 4.0 - 10.5 K/uL   RBC 2.43 (L) 4.22 - 5.81 MIL/uL   Hemoglobin 7.1 (L) 13.0 - 17.0 g/dL   HCT 22.6 (L) 39.0 - 52.0 %   MCV 93.0 78.0 - 100.0 fL   MCH 29.2 26.0 - 34.0 pg   MCHC 31.4 30.0 - 36.0 g/dL   RDW 14.6 11.5 - 15.5 %   Platelets 538 (H) 150 - 400 K/uL   Neutrophils Relative % 66 %   Neutro Abs 7.4 1.7 - 7.7 K/uL   Lymphocytes Relative 14 %   Lymphs Abs 1.5 0.7 - 4.0 K/uL   Monocytes Relative 14 %   Monocytes Absolute 1.6 (H) 0.1 - 1.0 K/uL   Eosinophils Relative 5 %   Eosinophils Absolute 0.5 0.0 - 0.7 K/uL   Basophils Relative 1 %   Basophils Absolute 0.1 0.0 - 0.1 K/uL  Basic metabolic panel     Status: Abnormal   Collection Time: 03/11/17  4:52 AM  Result Value Ref Range   Sodium 136 135 - 145 mmol/L   Potassium 5.0 3.5 - 5.1 mmol/L   Chloride 105 101 - 111 mmol/L   CO2 25 22 - 32 mmol/L   Glucose, Bld 97 65 - 99 mg/dL   BUN 29 (H) 6 - 20 mg/dL   Creatinine, Ser 1.84 (H) 0.61 - 1.24 mg/dL   Calcium 8.1 (L) 8.9 - 10.3 mg/dL   GFR calc non Af Amer 39 (L) >60 mL/min   GFR calc Af Amer 46 (L) >60 mL/min   Anion gap 6 5 - 15  Troponin I     Status: None   Collection Time: 03/11/17  4:52 AM  Result Value Ref Range   Troponin I <0.03 <0.03 ng/mL  Glucose, capillary     Status: Abnormal   Collection Time: 03/11/17  7:52 AM  Result Value Ref Range   Glucose-Capillary 141 (H) 65 - 99 mg/dL    Assessment & Plan: Present on Admission: . Multiple fractures of ribs, bilateral, initial encounter for closed fracture . Multiple closed anterior-posterior  compression fractures of pelvis (HCC)    LOS: 32 days   Additional comments:I reviewed the patient's new clinical lab test results. 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 Marcelino Scot 8/21 , S/P symphysis plating by Dr. Marcelino Scot 8/27,  - NWB BLE for 8 weeks, no ROM restrictions  Vent dependent acute hypoxic resp failure-weaningas tolerated, had plug overnight requiring CPR briefly FEN- TF IDDM- lantus and SSI R heel deep tissue injury pressure sore - per WOC VTE- Lovenox Elevated LFTs- direct hyperbilirubinemia from resorbtion of hematomas Dispo- ICU I spoke with his sister and answered her questions Critical Care Total Time*: 18 Minutes  Georganna Skeans, MD, MPH, FACS Trauma: [phone] General Surgery: [phone]  03/11/2017  *Care during the described time interval was provided by me. I have reviewed this patient's available data, including medical history, events of note, physical examination and test results as part of my evaluation.  Patient ID: Peter [name], male   DOB: [birth year], 56 y.o.   MRN: [id number]

## 2017-03-11 NOTE — Progress Notes (Signed)
Patient weaned 15/5 for for 7 1/2 hours. Patient placed back on full support for rest.

## 2017-03-12 ENCOUNTER — Inpatient Hospital Stay (HOSPITAL_COMMUNITY): Payer: BLUE CROSS/BLUE SHIELD

## 2017-03-12 LAB — GLUCOSE, CAPILLARY
GLUCOSE-CAPILLARY: 114 mg/dL — AB (ref 65–99)
GLUCOSE-CAPILLARY: 133 mg/dL — AB (ref 65–99)
Glucose-Capillary: 100 mg/dL — ABNORMAL HIGH (ref 65–99)
Glucose-Capillary: 119 mg/dL — ABNORMAL HIGH (ref 65–99)
Glucose-Capillary: 160 mg/dL — ABNORMAL HIGH (ref 65–99)
Glucose-Capillary: 133 mg/dL — ABNORMAL HIGH (ref 65–99)

## 2017-03-12 LAB — BASIC METABOLIC PANEL
Anion gap: 5 (ref 5–15)
BUN: 27 mg/dL — ABNORMAL HIGH (ref 6–20)
CALCIUM: 8 mg/dL — AB (ref 8.9–10.3)
CO2: 26 mmol/L (ref 22–32)
Chloride: 107 mmol/L (ref 101–111)
Creatinine, Ser: 0.82 mg/dL (ref 0.61–1.24)
GFR calc Af Amer: >60 mL/min (ref >60)
GFR calc non Af Amer: >60 mL/min (ref >60)
GLUCOSE: 107 mg/dL — AB (ref 65–99)
Potassium: 5.2 mmol/L — ABNORMAL HIGH (ref 3.5–5.1)
Sodium: 138 mmol/L (ref 135–145)

## 2017-03-12 LAB — CBC
HEMATOCRIT: 22.3 % — AB (ref 39.0–52.0)
Hemoglobin: 7.2 g/dL — ABNORMAL LOW (ref 13.0–17.0)
MCH: 29.9 pg (ref 26.0–34.0)
MCHC: 32.3 g/dL (ref 30.0–36.0)
MCV: 92.5 fL (ref 78.0–100.0)
Platelets: 570 10*3/uL — ABNORMAL HIGH (ref 150–400)
RBC: 2.41 MIL/uL — ABNORMAL LOW (ref 4.22–5.81)
RDW: 14.4 % (ref 11.5–15.5)
WBC: 12.3 10*3/uL — ABNORMAL HIGH (ref 4.0–10.5)

## 2017-03-12 MED ORDER — CHLORHEXIDINE GLUCONATE 0.12% ORAL RINSE (MEDLINE KIT)
15.0000 mL | Freq: Two times a day (BID) | OROMUCOSAL | Status: DC
  Administered 2017-03-12 – 2017-03-23 (×22): 15 mL via OROMUCOSAL

## 2017-03-12 MED ORDER — ORAL CARE MOUTH RINSE
15.0000 mL | Freq: Four times a day (QID) | OROMUCOSAL | Status: DC
  Administered 2017-03-12 – 2017-03-23 (×43): 15 mL via OROMUCOSAL

## 2017-03-12 NOTE — Progress Notes (Signed)
Patient placed on ATC at 35%02 and 8L flow. Sats are 97% and vitals are stable. RT will continue to monitor.

## 2017-03-12 NOTE — Progress Notes (Signed)
11 Days Post-Op   Subjective/Chief Complaint: No acute events overnight.   Objective: Vital signs in last 24 hours: Temp:  [98.5 F (36.9 C)-100 F (37.8 C)] 99.2 F (37.3 C) (09/23 0400) Pulse Rate:  [86-103] 96 (09/23 0757) Resp:  [18-27] 20 (09/23 0757) BP: (129-165)/(71-116) 161/87 (09/23 0757) SpO2:  [93 %-100 %] 100 % (09/23 0758) FiO2 (%):  [40 %] 40 % (09/23 0758) Weight:  [93.4 kg (205 lb 14.6 oz)] 93.4 kg (205 lb 14.6 oz) (09/23 0300) Last BM Date: 03/10/17  Intake/Output from previous day: 09/22 0701 - 09/23 0700 In: 3560 [I.V.:1200; NG/GT:2360] Out: 5550 [Urine:5550] Intake/Output this shift: No intake/output data recorded.  General: on vent Neuro: arouses but sedated HEENT/Neck: trach-clean, intact Resp: rhonchi bilaterally CVS: regular rate and rhythm, S1, S2 normal, no murmur, click, rub or gallop GI: soft, NT  Lab Results:   Recent Labs  03/11/17 0452 03/12/17 0427  WBC 11.0* 12.3*  HGB 7.1* 7.2*  HCT 22.6* 22.3*  PLT 538* 570*   BMET  Recent Labs  03/11/17 0452 03/12/17 0427  NA 136 138  K 5.0 5.2*  CL 105 107  CO2 25 26  GLUCOSE 97 107*  BUN 29* 27*  CREATININE 1.84* 0.82  CALCIUM 8.1* 8.0*   PT/INR No results for input(s): LABPROT, INR in the last 72 hours. ABG  Recent Labs  03/11/17 0010  PHART 7.419  HCO3 25.6    Studies/Results: Dg Chest Port 1 View  Result Date: 03/12/2017 CLINICAL DATA:  Respiratory failure EXAM: PORTABLE CHEST 1 VIEW COMPARISON:  March 10, 2017 FINDINGS: The tracheostomy tube and right PICC line are stable. No pneumothorax. The cardiomediastinal silhouette is stable. Suggested pulmonary venous congestion/ mild edema is mildly improved in the interval. More focal opacity in the left retrocardiac region is stable, possibly atelectasis. No other interval changes. IMPRESSION: 1. Support apparatus as above. 2. Mild edema persists but is improved in the interval. 3. A left retrocardiac opacity is stable  as well. Electronically Signed   By: Dorise Bullion III M.D   On: 03/12/2017 07:42   Dg Chest Port 1 View  Result Date: 03/10/2017 CLINICAL DATA:  Shortness of breath EXAM: PORTABLE CHEST 1 VIEW COMPARISON:  03/01/2017 FINDINGS: Tracheostomy tube with tip measuring 3.2 cm above the carina. Right PICC line with tip over the low cavoatrial junction region. No pneumothorax. Vascular coils in the left upper quadrant abdomen. Shallow inspiration. Cardiac enlargement with mild vascular congestion. Possible developing perihilar edema. No definite blunting of costophrenic angles. Acute appearing left rib fractures without change since previous studies. IMPRESSION: Cardiac enlargement with pulmonary vascular congestion and suggestion of early edema. No definite effusions. Appliances appear in satisfactory position. Left rib fractures as previously seen. Electronically Signed   By: Lucienne Capers M.D.   On: 03/10/2017 23:26    Anti-infectives: Anti-infectives    Start     Dose/Rate Route Frequency Ordered Stop   03/06/17 1600  ceFAZolin (ANCEF) IVPB 1 g/50 mL premix     1 g 100 mL/hr over 30 Minutes Intravenous Every 8 hours 03/06/17 1511 03/09/17 0629   02/19/17 2200  vancomycin (VANCOCIN) IVPB 750 mg/150 ml premix  Status:  Discontinued     750 mg 150 mL/hr over 60 Minutes Intravenous Every 12 hours 02/19/17 0807 02/20/17 1150   02/19/17 0900  vancomycin (VANCOCIN) 2,000 mg in sodium chloride 0.9 % 500 mL IVPB     2,000 mg 250 mL/hr over 120 Minutes Intravenous  Once 02/19/17 0807  02/19/17 1151   02/17/17 1100  cefTRIAXone (ROCEPHIN) 2 g in dextrose 5 % 50 mL IVPB  Status:  Discontinued     2 g 100 mL/hr over 30 Minutes Intravenous Every 24 hours 02/17/17 1041 02/19/17 0801   02/14/17 1500  piperacillin-tazobactam (ZOSYN) IVPB 3.375 g  Status:  Discontinued     3.375 g 12.5 mL/hr over 240 Minutes Intravenous Every 8 hours 02/14/17 1351 02/17/17 1041   02/11/17 1000  piperacillin-tazobactam  (ZOSYN) IVPB 3.375 g  Status:  Discontinued     3.375 g 12.5 mL/hr over 240 Minutes Intravenous Every 8 hours 02/11/17 0858 02/14/17 1351   02/11/17 1000  vancomycin (VANCOCIN) IVPB 750 mg/150 ml premix  Status:  Discontinued     750 mg 150 mL/hr over 60 Minutes Intravenous Every 12 hours 02/11/17 0858 02/14/17 0844   02/07/17 2000  ceFAZolin (ANCEF) IVPB 1 g/50 mL premix     1 g 100 mL/hr over 30 Minutes Intravenous Every 8 hours 02/07/17 1416 02/08/17 1543      Assessment/Plan: Present on Admission: . Multiple fractures of ribs, bilateral, initial encounter for closed fracture . Multiple closed anterior-posterior compression fractures of pelvis ([place])     Additional comments:I reviewed the patient's new clinical lab test results. 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 Marcelino Scot 8/21 , S/P symphysis plating by Dr. Marcelino Scot 8/27,  - NWB BLE for 8 weeks, no ROM restrictions  Vent dependent acute hypoxic resp failure-weaningas tolerated, had plug two nights ago requiring CPR briefly FEN- TF IDDM- lantus and SSI R heel deep tissue injury pressure sore - per [facility] VTE- Lovenox Elevated LFTs- direct hyperbilirubinemia from resorption of hematoma Dispo- ICU   LOS: 33 days    Tyashia Morrisette K. 03/12/2017

## 2017-03-13 LAB — GLUCOSE, CAPILLARY
GLUCOSE-CAPILLARY: 115 mg/dL — AB (ref 65–99)
GLUCOSE-CAPILLARY: 126 mg/dL — AB (ref 65–99)
GLUCOSE-CAPILLARY: 90 mg/dL (ref 65–99)
Glucose-Capillary: 156 mg/dL — ABNORMAL HIGH (ref 65–99)
Glucose-Capillary: 135 mg/dL — ABNORMAL HIGH (ref 65–99)
Glucose-Capillary: 132 mg/dL — ABNORMAL HIGH (ref 65–99)

## 2017-03-13 MED ORDER — CLONAZEPAM 0.5 MG PO TABS
0.5000 mg | ORAL_TABLET | Freq: Every day | ORAL | Status: DC
  Administered 2017-03-13 – 2017-03-22 (×10): 0.5 mg
  Filled 2017-03-13 (×10): qty 1

## 2017-03-13 MED ORDER — FUROSEMIDE 10 MG/ML IJ SOLN
40.0000 mg | Freq: Once | INTRAMUSCULAR | Status: AC
  Administered 2017-03-13: 40 mg via INTRAVENOUS
  Filled 2017-03-13: qty 4

## 2017-03-13 NOTE — Progress Notes (Signed)
I have spoken with pt's daughter and son in law at bedside regarding possible LTAC admission.  They are interested, and state they would prefer [facility], but defer to pt's wife for decision making.  Left pt's wife, Peter [name], message on voicemail.    Reinaldo Raddle, RN, BSN  Trauma/Neuro ICU Case Manager [phone]

## 2017-03-13 NOTE — Progress Notes (Signed)
SLP Cancellation Note  Patient Details Name: Peter [name] MRN: [id number] DOB: Jul 13, 1960   Cancelled treatment:       Reason Eval/Treat Not Completed: Medical issues which prohibited therapy. PMV orders received, on trach collar this am however back on vent. Will f/u in am 9/25.   [place], CCC-SLP [phone]    [name] 03/13/2017, 2:30 PM
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
## 2017-03-14 LAB — CBC
HCT: 26.4 % — ABNORMAL LOW (ref 39.0–52.0)
HEMATOCRIT: 21.2 % — AB (ref 39.0–52.0)
HEMOGLOBIN: 6.9 g/dL — AB (ref 13.0–17.0)
Hemoglobin: 8.6 g/dL — ABNORMAL LOW (ref 13.0–17.0)
MCH: 29.6 pg (ref 26.0–34.0)
MCH: 29.9 pg (ref 26.0–34.0)
MCHC: 32.5 g/dL (ref 30.0–36.0)
MCHC: 32.6 g/dL (ref 30.0–36.0)
MCV: 91 fL (ref 78.0–100.0)
MCV: 91.7 fL (ref 78.0–100.0)
PLATELETS: 490 10*3/uL — AB (ref 150–400)
Platelets: 530 10*3/uL — ABNORMAL HIGH (ref 150–400)
RBC: 2.33 MIL/uL — ABNORMAL LOW (ref 4.22–5.81)
RBC: 2.88 MIL/uL — ABNORMAL LOW (ref 4.22–5.81)
RDW: 14.5 % (ref 11.5–15.5)
RDW: 14.3 % (ref 11.5–15.5)
WBC: 11.6 10*3/uL — AB (ref 4.0–10.5)
WBC: 12.7 10*3/uL — ABNORMAL HIGH (ref 4.0–10.5)

## 2017-03-14 LAB — BASIC METABOLIC PANEL
ANION GAP: 5 (ref 5–15)
BUN: 32 mg/dL — ABNORMAL HIGH (ref 6–20)
CALCIUM: 8 mg/dL — AB (ref 8.9–10.3)
CO2: 26 mmol/L (ref 22–32)
CREATININE: 0.92 mg/dL (ref 0.61–1.24)
Chloride: 102 mmol/L (ref 101–111)
GFR calc non Af Amer: >60 mL/min (ref >60)
Glucose, Bld: 139 mg/dL — ABNORMAL HIGH (ref 65–99)
Potassium: 5 mmol/L (ref 3.5–5.1)
SODIUM: 133 mmol/L — AB (ref 135–145)

## 2017-03-14 LAB — GLUCOSE, CAPILLARY
GLUCOSE-CAPILLARY: 119 mg/dL — AB (ref 65–99)
GLUCOSE-CAPILLARY: 124 mg/dL — AB (ref 65–99)
GLUCOSE-CAPILLARY: 194 mg/dL — AB (ref 65–99)
Glucose-Capillary: 132 mg/dL — ABNORMAL HIGH (ref 65–99)
Glucose-Capillary: 131 mg/dL — ABNORMAL HIGH (ref 65–99)
Glucose-Capillary: 131 mg/dL — ABNORMAL HIGH (ref 65–99)

## 2017-03-14 MED ORDER — FUROSEMIDE 10 MG/ML IJ SOLN
40.0000 mg | Freq: Once | INTRAMUSCULAR | Status: AC
  Administered 2017-03-14: 40 mg via INTRAVENOUS
  Filled 2017-03-14: qty 4

## 2017-03-14 MED ORDER — SODIUM CHLORIDE 0.9 % IV SOLN
Freq: Once | INTRAVENOUS | Status: AC
  Administered 2017-03-14: 14:00:00 via INTRAVENOUS

## 2017-03-14 NOTE — Progress Notes (Signed)
PT has weaned on ATC all day. PT will continue to stay on ATC throughout the night with the vent in the room on standby. RT will monitor PT throughout the night. PT has a a HR of 99 and a SAT of 98%.

## 2017-03-14 NOTE — Progress Notes (Signed)
CRITICAL VALUE ALERT  Critical Value:  Hgb 6.9  Date & Time Notied:  9/25  0600  Provider Notified: Trauma MD  Orders Received/Actions taken: No new orders

## 2017-03-14 NOTE — Consult Note (Signed)
[place] Nurse wound follow-up consult note Reason for Consult: Re-assessed right heel wound, refer to previous consult notes Wound type: Dark purple deep tissue injury  Pressure Injury POA: No Measurement: Remains unchanged since previous assessment; 2X2.5cm Dressing procedure/placement/frequency: Heels are already floated in heel lift boots to reduce pressure. Family at the bedside to assess location. Please re-consult if further assistance is needed. Thank-you,  Julien Girt MSN, [place], [address], [place], [place]

## 2017-03-14 NOTE — Plan of Care (Signed)
Problem: Skin Integrity: Goal: Risk for impaired skin integrity will decrease Outcome: Progressing Wounds healing, several no longer needing foams.   Problem: Bowel/Gastric: Goal: Will not experience complications related to bowel motility Outcome: Progressing Regular bowel movements.   Problem: Education: Goal: Will regain or maintain usual level of consciousness Outcome: Progressing Nodding/gesturing appropriately to communicate with staff.   Problem: Respiratory: Goal: Ability to maintain adequate ventilation will improve Outcome: Progressing Patient tolerating trach collar wean.
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
## 2017-03-14 NOTE — Progress Notes (Addendum)
Spoke with pt's wife, [name], regarding LTAC choice.  She is agreeable to considering LTAC for pt, and chooses [place].  Notified admissions liaison for [facility], who will begin authorization process with pt's insurance.  Will follow with updates as they are available.   Reinaldo Raddle, RN, BSN  Trauma/Neuro ICU Case Manager [phone]

## 2017-03-15 LAB — GLUCOSE, CAPILLARY
GLUCOSE-CAPILLARY: 144 mg/dL — AB (ref 65–99)
GLUCOSE-CAPILLARY: 109 mg/dL — AB (ref 65–99)
Glucose-Capillary: 154 mg/dL — ABNORMAL HIGH (ref 65–99)
Glucose-Capillary: 157 mg/dL — ABNORMAL HIGH (ref 65–99)
Glucose-Capillary: 186 mg/dL — ABNORMAL HIGH (ref 65–99)

## 2017-03-15 MED ORDER — FUROSEMIDE 10 MG/ML IJ SOLN
40.0000 mg | Freq: Once | INTRAMUSCULAR | Status: AC
  Administered 2017-03-15: 40 mg via INTRAVENOUS
  Filled 2017-03-15: qty 4
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
## 2017-03-15 NOTE — Progress Notes (Signed)
Dr. Grandville Silos notified of foley leaking. Verbal order to break seal and replace bag. No leaking noted at this time. Lianne Bushy RN BSN.
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
## 2017-03-16 LAB — BASIC METABOLIC PANEL
Anion gap: 6 (ref 5–15)
BUN: 38 mg/dL — AB (ref 6–20)
CALCIUM: 8.5 mg/dL — AB (ref 8.9–10.3)
CHLORIDE: 98 mmol/L — AB (ref 101–111)
CO2: 30 mmol/L (ref 22–32)
CREATININE: 0.91 mg/dL (ref 0.61–1.24)
GFR calc Af Amer: >60 mL/min (ref >60)
GFR calc non Af Amer: >60 mL/min (ref >60)
Glucose, Bld: 130 mg/dL — ABNORMAL HIGH (ref 65–99)
Potassium: 5.3 mmol/L — ABNORMAL HIGH (ref 3.5–5.1)
SODIUM: 134 mmol/L — AB (ref 135–145)

## 2017-03-16 LAB — GLUCOSE, CAPILLARY
GLUCOSE-CAPILLARY: 138 mg/dL — AB (ref 65–99)
GLUCOSE-CAPILLARY: 158 mg/dL — AB (ref 65–99)
GLUCOSE-CAPILLARY: 171 mg/dL — AB (ref 65–99)
GLUCOSE-CAPILLARY: 181 mg/dL — AB (ref 65–99)
GLUCOSE-CAPILLARY: 189 mg/dL — AB (ref 65–99)
Glucose-Capillary: 136 mg/dL — ABNORMAL HIGH (ref 65–99)
Glucose-Capillary: 146 mg/dL — ABNORMAL HIGH (ref 65–99)

## 2017-03-16 MED ORDER — FUROSEMIDE 10 MG/ML IJ SOLN
80.0000 mg | Freq: Once | INTRAMUSCULAR | Status: AC
  Administered 2017-03-16: 80 mg via INTRAVENOUS
  Filled 2017-03-16: qty 8

## 2017-03-16 MED ORDER — SODIUM POLYSTYRENE SULFONATE 15 GM/60ML PO SUSP
30.0000 g | Freq: Once | ORAL | Status: AC
  Administered 2017-03-16: 30 g
  Filled 2017-03-16: qty 120

## 2017-03-16 MED ORDER — SODIUM CHLORIDE 0.9 % IV SOLN
1.0000 g | Freq: Once | INTRAVENOUS | Status: AC
  Administered 2017-03-16: 1 g via INTRAVENOUS
  Filled 2017-03-16: qty 10
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
## 2017-03-17 LAB — GLUCOSE, CAPILLARY
GLUCOSE-CAPILLARY: 131 mg/dL — AB (ref 65–99)
GLUCOSE-CAPILLARY: 111 mg/dL — AB (ref 65–99)
Glucose-Capillary: 156 mg/dL — ABNORMAL HIGH (ref 65–99)
Glucose-Capillary: 143 mg/dL — ABNORMAL HIGH (ref 65–99)
Glucose-Capillary: 167 mg/dL — ABNORMAL HIGH (ref 65–99)
Glucose-Capillary: 178 mg/dL — ABNORMAL HIGH (ref 65–99)

## 2017-03-17 LAB — CBC
HCT: 16.4 % — ABNORMAL LOW (ref 39.0–52.0)
HCT: 32.6 % — ABNORMAL LOW (ref 39.0–52.0)
Hemoglobin: 5.2 g/dL — CL (ref 13.0–17.0)
Hemoglobin: 10.7 g/dL — ABNORMAL LOW (ref 13.0–17.0)
MCH: 29.2 pg (ref 26.0–34.0)
MCH: 29.3 pg (ref 26.0–34.0)
MCHC: 31.7 g/dL (ref 30.0–36.0)
MCHC: 32.8 g/dL (ref 30.0–36.0)
MCV: 92.1 fL (ref 78.0–100.0)
MCV: 89.3 fL (ref 78.0–100.0)
PLATELETS: 263 10*3/uL (ref 150–400)
PLATELETS: 400 10*3/uL (ref 150–400)
RBC: 1.78 MIL/uL — AB (ref 4.22–5.81)
RBC: 3.65 MIL/uL — ABNORMAL LOW (ref 4.22–5.81)
RDW: 14.3 % (ref 11.5–15.5)
RDW: 15.7 % — AB (ref 11.5–15.5)
WBC: 5.8 10*3/uL (ref 4.0–10.5)
WBC: 11.3 10*3/uL — AB (ref 4.0–10.5)

## 2017-03-17 LAB — BASIC METABOLIC PANEL
ANION GAP: 7 (ref 5–15)
BUN: 42 mg/dL — AB (ref 6–20)
CALCIUM: 8.3 mg/dL — AB (ref 8.9–10.3)
CO2: 32 mmol/L (ref 22–32)
CREATININE: 0.97 mg/dL (ref 0.61–1.24)
Chloride: 97 mmol/L — ABNORMAL LOW (ref 101–111)
GFR calc Af Amer: >60 mL/min (ref >60)
GLUCOSE: 167 mg/dL — AB (ref 65–99)
Potassium: 4.2 mmol/L (ref 3.5–5.1)
Sodium: 136 mmol/L (ref 135–145)

## 2017-03-17 LAB — PREPARE RBC (CROSSMATCH)

## 2017-03-17 MED ORDER — FUROSEMIDE 10 MG/ML PO SOLN: 20.0000 mg | Freq: Once | ORAL | Status: DC

## 2017-03-17 MED ORDER — FUROSEMIDE 10 MG/ML IJ SOLN
20.0000 mg | Freq: Once | INTRAMUSCULAR | Status: AC
  Administered 2017-03-17: 20 mg via INTRAVENOUS
  Filled 2017-03-17: qty 2

## 2017-03-17 MED ORDER — SODIUM CHLORIDE 0.9 % IV SOLN
Freq: Once | INTRAVENOUS | Status: AC
  Administered 2017-03-17: 08:00:00 via INTRAVENOUS

## 2017-03-17 MED ORDER — SODIUM CHLORIDE 0.9 % IV SOLN: Freq: Once | INTRAVENOUS | Status: AC
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
## 2017-03-17 NOTE — Progress Notes (Signed)
MD paged regarding patients Hemoglobin of 5.2. New orders received and carried out. Will continue to monitor. Lianne Bushy RN BSN.
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
## 2017-03-17 NOTE — Progress Notes (Signed)
Patient ID: Delos Klich, male   DOB: 24-Aug-1960, 56 y.o.   MRN: [id number] Follow up - Trauma Critical Care  Patient Details:    Holger Sokolowski is an 56 y.o. male.  Lines/tubes : PICC Double Lumen 02/23/17 PICC Right Cephalic 42 cm 0 cm (Active)  Indication for Insertion or Continuance of Line Prolonged intravenous therapies;Limited venous access - need for IV therapy >5 days (PICC only) 03/17/2017  8:00 AM  Exposed Catheter (cm) 0 cm 03/07/2017  8:00 AM  Site Assessment Clean;Dry;Intact 03/16/2017  8:00 PM  Lumen #1 Status In-line blood sampling system in place;Infusing;Flushed 03/16/2017  8:00 PM  Lumen #2 Status Infusing 03/16/2017  8:00 PM  Dressing Type Transparent 03/16/2017  8:00 PM  Dressing Status Clean;Dry;Intact;Antimicrobial disc in place 03/16/2017  8:00 PM  Line Care Connections checked and tightened 03/16/2017  8:00 PM  Dressing Intervention New dressing;Dressing changed;Antimicrobial disc changed;Securement device changed 03/15/2017  2:00 PM  Dressing Change Due 03/22/17 03/16/2017  8:00 PM     Gastrostomy/Enterostomy PEG-jejunostomy (Active)  Surrounding Skin Intact 03/16/2017  8:00 PM  Tube Status Patent 03/16/2017  8:00 PM  Drainage Appearance Yellow 03/16/2017  8:00 PM  Dressing Status Clean;Dry;Intact 03/16/2017  8:00 PM  Dressing Intervention New dressing 03/16/2017  8:00 AM  Dressing Type Abdominal Binder;Split gauze 03/16/2017  8:00 PM  G Port Intake (mL) 200 ml 03/15/2017 10:00 PM  Output (mL) 50 mL 03/08/2017  6:00 PM     Urethral Catheter [name] Non-latex 16 Fr. (Active)  Indication for Insertion or Continuance of Catheter Acute urinary retention;Other (comment) 03/17/2017  8:00 AM  Site Assessment Clean;Intact;Dry 03/16/2017  8:00 PM  Catheter Maintenance Bag below level of bladder;Catheter secured;Drainage bag/tubing not touching floor;Insertion date on drainage bag;No dependent loops;Seal intact;Bag emptied prior to transport 03/17/2017  8:00 AM  Collection  Container Standard drainage bag 03/16/2017  8:00 PM  Securement Method Securing device (Describe) 03/16/2017  8:00 PM  Urinary Catheter Interventions Unclamped 03/16/2017  8:00 PM  Output (mL) 200 mL 03/17/2017  8:00 AM    Microbiology/Sepsis markers: Results for orders placed or performed during the hospital encounter of 02/07/17  MRSA PCR Screening     Status: None   Collection Time: 02/08/17  4:08 AM  Result Value Ref Range Status   MRSA by PCR NEGATIVE NEGATIVE Final    Comment:        The GeneXpert MRSA Assay (FDA approved for NASAL specimens only), is one component of a comprehensive MRSA colonization surveillance program. It is not intended to diagnose MRSA infection nor to guide or monitor treatment for MRSA infections.   Culture, respiratory (NON-Expectorated)     Status: None   Collection Time: 02/10/17 10:59 AM  Result Value Ref Range Status   Specimen Description TRACHEAL ASPIRATE  Final   Special Requests Normal  Final   Gram Stain   Final    ABUNDANT WBC PRESENT, PREDOMINANTLY PMN RARE SQUAMOUS EPITHELIAL CELLS PRESENT ABUNDANT GRAM NEGATIVE COCCI IN PAIRS ABUNDANT GRAM NEGATIVE RODS MODERATE GRAM POSITIVE COCCI IN CHAINS    Culture ABUNDANT Consistent with normal respiratory flora.  Final   Report Status 02/12/2017 FINAL  Final  Culture, blood (Routine X 2) w Reflex to ID Panel     Status: Abnormal   Collection Time: 02/11/17  3:25 PM  Result Value Ref Range Status   Specimen Description BLOOD RIGHT HAND  Final   Special Requests   Final    BOTTLES DRAWN AEROBIC ONLY Blood Culture adequate  volume   Culture  Setup Time   Final    GRAM NEGATIVE RODS AEROBIC BOTTLE ONLY CRITICAL VALUE NOTED.  VALUE IS CONSISTENT WITH PREVIOUSLY REPORTED AND CALLED VALUE.    Culture (A)  Final    KLEBSIELLA OXYTOCA SUSCEPTIBILITIES PERFORMED ON PREVIOUS CULTURE WITHIN THE LAST 5 DAYS.    Report Status 02/17/2017 FINAL  Final  Culture, blood (Routine X 2) w Reflex to ID  Panel     Status: Abnormal   Collection Time: 02/11/17  3:25 PM  Result Value Ref Range Status   Specimen Description BLOOD RIGHT HAND  Final   Special Requests   Final    BOTTLES DRAWN AEROBIC ONLY Blood Culture adequate volume   Culture  Setup Time   Final    GRAM NEGATIVE RODS AEROBIC BOTTLE ONLY CRITICAL RESULT CALLED TO, READ BACK BY AND VERIFIED WITH: J.LEDFORD, PHARMD 02/15/17 0632 L.CHAMPION    Culture KLEBSIELLA OXYTOCA (A)  Final   Report Status 02/17/2017 FINAL  Final   Organism ID, Bacteria KLEBSIELLA OXYTOCA  Final      Susceptibility   Klebsiella oxytoca - MIC*    AMPICILLIN >=32 RESISTANT Resistant     CEFAZOLIN >=64 RESISTANT Resistant     CEFEPIME <=1 SENSITIVE Sensitive     CEFTAZIDIME <=1 SENSITIVE Sensitive     CEFTRIAXONE <=1 SENSITIVE Sensitive     CIPROFLOXACIN <=0.25 SENSITIVE Sensitive     GENTAMICIN <=1 SENSITIVE Sensitive     IMIPENEM <=0.25 SENSITIVE Sensitive     TRIMETH/SULFA <=20 SENSITIVE Sensitive     AMPICILLIN/SULBACTAM 8 SENSITIVE Sensitive     PIP/TAZO <=4 SENSITIVE Sensitive     Extended ESBL NEGATIVE Sensitive     * KLEBSIELLA OXYTOCA  Blood Culture ID Panel (Reflexed)     Status: Abnormal   Collection Time: 02/11/17  3:25 PM  Result Value Ref Range Status   Enterococcus species NOT DETECTED NOT DETECTED Final   Listeria monocytogenes NOT DETECTED NOT DETECTED Final   Staphylococcus species NOT DETECTED NOT DETECTED Final   Staphylococcus aureus NOT DETECTED NOT DETECTED Final   Streptococcus species NOT DETECTED NOT DETECTED Final   Streptococcus agalactiae NOT DETECTED NOT DETECTED Final   Streptococcus pneumoniae NOT DETECTED NOT DETECTED Final   Streptococcus pyogenes NOT DETECTED NOT DETECTED Final   Acinetobacter baumannii NOT DETECTED NOT DETECTED Final   Enterobacteriaceae species DETECTED (A) NOT DETECTED Final    Comment: Enterobacteriaceae represent a large family of gram-negative bacteria, not a single organism. CRITICAL  RESULT CALLED TO, READ BACK BY AND VERIFIED WITH: J.LEDFORD, PHARMD 02/15/17 0632 L.CHAMPION    Enterobacter cloacae complex NOT DETECTED NOT DETECTED Final   Escherichia coli NOT DETECTED NOT DETECTED Final   Klebsiella oxytoca DETECTED (A) NOT DETECTED Final    Comment: CRITICAL RESULT CALLED TO, READ BACK BY AND VERIFIED WITH: J.LEDFORD, PHARMD 02/15/17 0632 L.CHAMPION    Klebsiella pneumoniae NOT DETECTED NOT DETECTED Final   Proteus species NOT DETECTED NOT DETECTED Final   Serratia marcescens NOT DETECTED NOT DETECTED Final   Carbapenem resistance NOT DETECTED NOT DETECTED Final   Haemophilus influenzae NOT DETECTED NOT DETECTED Final   Neisseria meningitidis NOT DETECTED NOT DETECTED Final   Pseudomonas aeruginosa NOT DETECTED NOT DETECTED Final   Candida albicans NOT DETECTED NOT DETECTED Final   Candida glabrata NOT DETECTED NOT DETECTED Final   Candida krusei NOT DETECTED NOT DETECTED Final   Candida parapsilosis NOT DETECTED NOT DETECTED Final   Candida tropicalis NOT DETECTED NOT DETECTED Final  Surgical PCR screen     Status: None   Collection Time: 02/13/17  6:30 AM  Result Value Ref Range Status   MRSA, PCR NEGATIVE NEGATIVE Final   Staphylococcus aureus NEGATIVE NEGATIVE Final    Comment:        The Xpert SA Assay (FDA approved for NASAL specimens in patients over 59 years of age), is one component of a comprehensive surveillance program.  Test performance has been validated by [facility] for patients greater than or equal to 67 year old. It is not intended to diagnose infection nor to guide or monitor treatment.   Culture, blood (Routine X 2) w Reflex to ID Panel     Status: None   Collection Time: 02/18/17  2:52 PM  Result Value Ref Range Status   Specimen Description BLOOD RIGHT HAND  Final   Special Requests IN PEDIATRIC BOTTLE Blood Culture adequate volume  Final   Culture NO GROWTH 5 DAYS  Final   Report Status 02/23/2017 FINAL  Final  Culture, blood  (Routine X 2) w Reflex to ID Panel     Status: Abnormal   Collection Time: 02/18/17  2:55 PM  Result Value Ref Range Status   Specimen Description BLOOD RIGHT WRIST  Final   Special Requests IN PEDIATRIC BOTTLE Blood Culture adequate volume  Final   Culture  Setup Time   Final    IN PEDIATRIC BOTTLE GRAM POSITIVE RODS CRITICAL RESULT CALLED TO, READ BACK BY AND VERIFIED WITH: L FOLTANSKI,PHARMD AT 0730 02/19/17 BY L BENFIELD    Culture (A)  Final    BACILLUS SPECIES Standardized susceptibility testing for this organism is not available. ENTEROCOCCUS DETECTED ON BCID, NOT RECOVERED IN CULTURE    Report Status 02/21/2017 FINAL  Final  Blood Culture ID Panel (Reflexed)     Status: Abnormal   Collection Time: 02/18/17  2:55 PM  Result Value Ref Range Status   Enterococcus species DETECTED (A) NOT DETECTED Final    Comment: CRITICAL RESULT CALLED TO, READ BACK BY AND VERIFIED WITH: L FOLTANSKI,PHARMD AT 0730 02/19/17 BY L BENFIELD    Vancomycin resistance NOT DETECTED NOT DETECTED Final   Listeria monocytogenes NOT DETECTED NOT DETECTED Final   Staphylococcus species NOT DETECTED NOT DETECTED Final   Staphylococcus aureus NOT DETECTED NOT DETECTED Final   Streptococcus species NOT DETECTED NOT DETECTED Final   Streptococcus agalactiae NOT DETECTED NOT DETECTED Final   Streptococcus pneumoniae NOT DETECTED NOT DETECTED Final   Streptococcus pyogenes NOT DETECTED NOT DETECTED Final   Acinetobacter baumannii NOT DETECTED NOT DETECTED Final   Enterobacteriaceae species NOT DETECTED NOT DETECTED Final   Enterobacter cloacae complex NOT DETECTED NOT DETECTED Final   Escherichia coli NOT DETECTED NOT DETECTED Final   Klebsiella oxytoca NOT DETECTED NOT DETECTED Final   Klebsiella pneumoniae NOT DETECTED NOT DETECTED Final   Proteus species NOT DETECTED NOT DETECTED Final   Serratia marcescens NOT DETECTED NOT DETECTED Final   Haemophilus influenzae NOT DETECTED NOT DETECTED Final   Neisseria  meningitidis NOT DETECTED NOT DETECTED Final   Pseudomonas aeruginosa NOT DETECTED NOT DETECTED Final   Candida albicans NOT DETECTED NOT DETECTED Final   Candida glabrata NOT DETECTED NOT DETECTED Final   Candida krusei NOT DETECTED NOT DETECTED Final   Candida parapsilosis NOT DETECTED NOT DETECTED Final   Candida tropicalis NOT DETECTED NOT DETECTED Final  Culture, blood (routine x 2)     Status: None   Collection Time: 02/20/17  4:35 AM  Result Value Ref Range Status   Specimen Description BLOOD RIGHT HAND  Final   Special Requests   Final    BOTTLES DRAWN AEROBIC AND ANAEROBIC Blood Culture adequate volume   Culture NO GROWTH 5 DAYS  Final   Report Status 02/25/2017 FINAL  Final  Culture, blood (routine x 2)     Status: None   Collection Time: 02/20/17  4:35 AM  Result Value Ref Range Status   Specimen Description BLOOD RIGHT HAND  Final   Special Requests   Final    BOTTLES DRAWN AEROBIC AND ANAEROBIC Blood Culture adequate volume   Culture NO GROWTH 5 DAYS  Final   Report Status 02/25/2017 FINAL  Final    Anti-infectives:  Anti-infectives    Start     Dose/Rate Route Frequency Ordered Stop   03/06/17 1600  ceFAZolin (ANCEF) IVPB 1 g/50 mL premix     1 g 100 mL/hr over 30 Minutes Intravenous Every 8 hours 03/06/17 1511 03/09/17 0629   02/19/17 2200  vancomycin (VANCOCIN) IVPB 750 mg/150 ml premix  Status:  Discontinued     750 mg 150 mL/hr over 60 Minutes Intravenous Every 12 hours 02/19/17 0807 02/20/17 1150   02/19/17 0900  vancomycin (VANCOCIN) 2,000 mg in sodium chloride 0.9 % 500 mL IVPB     2,000 mg 250 mL/hr over 120 Minutes Intravenous  Once 02/19/17 0807 02/19/17 1151   02/17/17 1100  cefTRIAXone (ROCEPHIN) 2 g in dextrose 5 % 50 mL IVPB  Status:  Discontinued     2 g 100 mL/hr over 30 Minutes Intravenous Every 24 hours 02/17/17 1041 02/19/17 0801   02/14/17 1500  piperacillin-tazobactam (ZOSYN) IVPB 3.375 g  Status:  Discontinued     3.375 g 12.5 mL/hr over  240 Minutes Intravenous Every 8 hours 02/14/17 1351 02/17/17 1041   02/11/17 1000  piperacillin-tazobactam (ZOSYN) IVPB 3.375 g  Status:  Discontinued     3.375 g 12.5 mL/hr over 240 Minutes Intravenous Every 8 hours 02/11/17 0858 02/14/17 1351   02/11/17 1000  vancomycin (VANCOCIN) IVPB 750 mg/150 ml premix  Status:  Discontinued     750 mg 150 mL/hr over 60 Minutes Intravenous Every 12 hours 02/11/17 0858 02/14/17 0844   02/07/17 2000  ceFAZolin (ANCEF) IVPB 1 g/50 mL premix     1 g 100 mL/hr over 30 Minutes Intravenous Every 8 hours 02/07/17 1416 02/08/17 1543      Best Practice/Protocols:  VTE Prophylaxis: Mechanical GI Prophylaxis: Proton Pump Inhibitor   Consults: Treatment Team:  Altamese Goochland, MD    Studies:    Events:  Subjective:    Overnight Issues:  Got 1u prbc yesterday and responded but hgb dropped again this am; nurse reports no sign of GI bleed - green stools. O/w no issues Objective:  Vital signs for last 24 hours: Temp:  [98.5 F (36.9 C)-99.6 F (37.6 C)] 98.5 F (36.9 C) (09/28 0927) Pulse Rate:  [64-106] 64 (09/28 0927) Resp:  [10-28] 25 (09/28 0927) BP: (118-155)/(65-80) 155/79 (09/28 0927) SpO2:  [91 %-100 %] 100 % (09/28 0927) FiO2 (%):  [28 %-35 %] 28 % (09/28 0400) Weight:  [81.9 kg (180 lb 8.9 oz)] 81.9 kg (180 lb 8.9 oz) (09/28 0500)  Hemodynamic parameters for last 24 hours:    Intake/Output from previous day: 09/27 0701 - 09/28 0700 In: 2510 [I.V.:240; NG/GT:2160; IV Piggyback:110] Out: 3900 [Urine:3900]  Intake/Output this shift: Total I/O In: 150 [I.V.:20; NG/GT:130] Out: 200 [Urine:200]  Vent settings for  last 24 hours: FiO2 (%):  [28 %-35 %] 28 %  Physical Exam:  General: no distress; sitting in chair Neuro: F/C HEENT/Neck: trach-clean, intact Resp: clear to auscultation bilaterally; some coarse BS at bases CVS: RRR GI: soft, NT, PEG, binder Extremities: edema trace   Results for orders placed or performed during  the hospital encounter of 02/07/17 (from the past 24 hour(s))  Glucose, capillary     Status: Abnormal   Collection Time: 03/16/17 11:35 AM  Result Value Ref Range   Glucose-Capillary 138 (H) 65 - 99 mg/dL   Comment 1 Notify RN    Comment 2 Document in Chart   Glucose, capillary     Status: Abnormal   Collection Time: 03/16/17  4:11 PM  Result Value Ref Range   Glucose-Capillary 181 (H) 65 - 99 mg/dL  Glucose, capillary     Status: Abnormal   Collection Time: 03/16/17  7:55 PM  Result Value Ref Range   Glucose-Capillary 189 (H) 65 - 99 mg/dL  Glucose, capillary     Status: Abnormal   Collection Time: 03/16/17 11:22 PM  Result Value Ref Range   Glucose-Capillary 171 (H) 65 - 99 mg/dL  Glucose, capillary     Status: Abnormal   Collection Time: 03/17/17  3:43 AM  Result Value Ref Range   Glucose-Capillary 156 (H) 65 - 99 mg/dL  Basic metabolic panel     Status: Abnormal   Collection Time: 03/17/17  5:00 AM  Result Value Ref Range   Sodium 136 135 - 145 mmol/L   Potassium 4.2 3.5 - 5.1 mmol/L   Chloride 97 (L) 101 - 111 mmol/L   CO2 32 22 - 32 mmol/L   Glucose, Bld 167 (H) 65 - 99 mg/dL   BUN 42 (H) 6 - 20 mg/dL   Creatinine, Ser 0.97 0.61 - 1.24 mg/dL   Calcium 8.3 (L) 8.9 - 10.3 mg/dL   GFR calc non Af Amer >60 >60 mL/min   GFR calc Af Amer >60 >60 mL/min   Anion gap 7 5 - 15  CBC     Status: Abnormal   Collection Time: 03/17/17  5:00 AM  Result Value Ref Range   WBC 5.8 4.0 - 10.5 K/uL   RBC 1.78 (L) 4.22 - 5.81 MIL/uL   Hemoglobin 5.2 (LL) 13.0 - 17.0 g/dL   HCT 16.4 (L) 39.0 - 52.0 %   MCV 92.1 78.0 - 100.0 fL   MCH 29.2 26.0 - 34.0 pg   MCHC 31.7 30.0 - 36.0 g/dL   RDW 14.3 11.5 - 15.5 %   Platelets 263 150 - 400 K/uL  Prepare RBC     Status: None   Collection Time: 03/17/17  7:24 AM  Result Value Ref Range   Order Confirmation ORDER PROCESSED BY BLOOD BANK   Glucose, capillary     Status: Abnormal   Collection Time: 03/17/17  7:50 AM  Result Value Ref Range    Glucose-Capillary 143 (H) 65 - 99 mg/dL    Assessment & Plan: Present on Admission: . Multiple fractures of ribs, bilateral, initial encounter for closed fracture . 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 Marcelino Scot 8/21 , S/P symphysis plating by Dr. Marcelino Scot 8/27,  -  NWB BLE for 8 weeks total, no ROM restrictions  Vent dependent acute hypoxic resp failure- on HTC, was back on vent overnight, try HTC as long as able, more lasix today since transfusing IDDM- lantus and SSI ABL anemia - TF 1u PRBC 9/27; hgb 6.9-->1u-->8.6-->5.2 this am; transfuse 2u today. Check post transfusion hgb; nurse reports no sign of GI bleed; if cont to drop hgb may need to repeat CT R heel deep tissue injury pressure sore- per [facility]OC VTE- d/c Lovenox with Hb drop FEN - lasix 20mg  after blood today; lytes ok today; repeat bmet in am; cont TF Dispo- ICU,   LOS: 38 days   Additional comments:I reviewed the patient's new clinical lab test results.   Critical Care Total Time*: Seymour M. Redmond Pulling, MD, FACS General, Bariatric, & Minimally Invasive Surgery [facility], [place]   03/17/2017  *Care during the described time interval was provided by me. I have reviewed this patient's available data, including medical history, events of note, physical examination and test results as part of my evaluation.
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
## 2017-03-18 ENCOUNTER — Encounter (HOSPITAL_COMMUNITY): Payer: Self-pay

## 2017-03-18 LAB — TYPE AND SCREEN
ABO/RH(D): AB POS
ABO/RH(D): AB POS
ANTIBODY SCREEN: NEGATIVE
Antibody Screen: NEGATIVE
DAT, IgG: NEGATIVE
UNIT DIVISION: 0
UNIT DIVISION: 0
Unit division: 0

## 2017-03-18 LAB — BASIC METABOLIC PANEL
ANION GAP: 7 (ref 5–15)
BUN: 44 mg/dL — ABNORMAL HIGH (ref 6–20)
CHLORIDE: 98 mmol/L — AB (ref 101–111)
CO2: 33 mmol/L — AB (ref 22–32)
Calcium: 8.5 mg/dL — ABNORMAL LOW (ref 8.9–10.3)
Creatinine, Ser: 0.95 mg/dL (ref 0.61–1.24)
GFR calc Af Amer: >60 mL/min (ref >60)
GLUCOSE: 135 mg/dL — AB (ref 65–99)
POTASSIUM: 4.7 mmol/L (ref 3.5–5.1)
Sodium: 138 mmol/L (ref 135–145)

## 2017-03-18 LAB — GLUCOSE, CAPILLARY
GLUCOSE-CAPILLARY: 126 mg/dL — AB (ref 65–99)
GLUCOSE-CAPILLARY: 170 mg/dL — AB (ref 65–99)
Glucose-Capillary: 100 mg/dL — ABNORMAL HIGH (ref 65–99)
Glucose-Capillary: 123 mg/dL — ABNORMAL HIGH (ref 65–99)
Glucose-Capillary: 142 mg/dL — ABNORMAL HIGH (ref 65–99)
Glucose-Capillary: 144 mg/dL — ABNORMAL HIGH (ref 65–99)

## 2017-03-18 LAB — BPAM RBC
BLOOD PRODUCT EXPIRATION DATE: 201810092359
BLOOD PRODUCT EXPIRATION DATE: 201810102359
Blood Product Expiration Date: 201810092359
ISSUE DATE / TIME: 201809251333
ISSUE DATE / TIME: 201809280742
ISSUE DATE / TIME: 201809281021
UNIT TYPE AND RH: 6200
UNIT TYPE AND RH: 7300
Unit Type and Rh: 7300

## 2017-03-18 LAB — CBC
HEMATOCRIT: 30.3 % — AB (ref 39.0–52.0)
HEMOGLOBIN: 10 g/dL — AB (ref 13.0–17.0)
MCH: 29.9 pg (ref 26.0–34.0)
MCHC: 33 g/dL (ref 30.0–36.0)
MCV: 90.7 fL (ref 78.0–100.0)
Platelets: 388 10*3/uL (ref 150–400)
RBC: 3.34 MIL/uL — AB (ref 4.22–5.81)
RDW: 16.2 % — ABNORMAL HIGH (ref 11.5–15.5)
WBC: 9 10*3/uL (ref 4.0–10.5)

## 2017-03-18 LAB — MAGNESIUM: Magnesium: 2 mg/dL (ref 1.7–2.4)
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
## 2017-03-19 LAB — GLUCOSE, CAPILLARY
GLUCOSE-CAPILLARY: 130 mg/dL — AB (ref 65–99)
GLUCOSE-CAPILLARY: 143 mg/dL — AB (ref 65–99)
GLUCOSE-CAPILLARY: 150 mg/dL — AB (ref 65–99)
GLUCOSE-CAPILLARY: 159 mg/dL — AB (ref 65–99)
GLUCOSE-CAPILLARY: 180 mg/dL — AB (ref 65–99)
Glucose-Capillary: 150 mg/dL — ABNORMAL HIGH (ref 65–99)

## 2017-03-19 MED ORDER — ENOXAPARIN SODIUM 40 MG/0.4ML ~~LOC~~ SOLN
40.0000 mg | SUBCUTANEOUS | Status: DC
  Administered 2017-03-19 – 2017-03-21 (×3): 40 mg via SUBCUTANEOUS
  Filled 2017-03-19 (×3): qty 0.4
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
## 2017-03-19 NOTE — Plan of Care (Signed)
Problem: Skin Integrity: Goal: Risk for impaired skin integrity will decrease Outcome: Progressing Wounds healing  Problem: Activity: Goal: Ability to tolerate increased activity will improve Outcome: Progressing Pt tolerating sitting in chair

## 2017-03-20 LAB — GLUCOSE, CAPILLARY
GLUCOSE-CAPILLARY: 139 mg/dL — AB (ref 65–99)
GLUCOSE-CAPILLARY: 145 mg/dL — AB (ref 65–99)
GLUCOSE-CAPILLARY: 154 mg/dL — AB (ref 65–99)
GLUCOSE-CAPILLARY: 178 mg/dL — AB (ref 65–99)
GLUCOSE-CAPILLARY: 180 mg/dL — AB (ref 65–99)
Glucose-Capillary: 166 mg/dL — ABNORMAL HIGH (ref 65–99)
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
## 2017-03-20 NOTE — Progress Notes (Signed)
Patient ID: Peter [name], male   DOB: Jul 14, 1960, 56 y.o.   MRN: [id number] I did a peer to peer phone call with the [facility] physician. [employer] is denying LTACH placement at this time.  Georganna Skeans, MD, MPH, FACS Trauma: [phone] General Surgery: [phone]

## 2017-03-20 NOTE — Consult Note (Addendum)
WOC consult requested for a full thickness wound to penis, bedside nurse reports that foley is visible through the site.  This complex medical condition is is beyond Thomas scope of practice. Please consult urology for further plan of care. Please re-consult if further assistance is needed.  Thank-you,  Julien Girt MSN, [place], [place], [place], [place]

## 2017-03-20 NOTE — Progress Notes (Signed)
Attending MD completed peer to peer review with medical director of insurance company.  Unfortunately, pt has been denied for LTAC admission.  Pt now has been weaned to 28% TC for over 48 hours.  He may be able to tolerate inpatient rehab.  After discussing with Trauma team, we will order a CIR consult.  I have updated pt's wife, [name], who is in agreement with this plan.  Will follow.    Reinaldo Raddle, RN, BSN  Trauma/Neuro ICU Case Manager [phone]
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
## 2017-03-21 DIAGNOSIS — D62 Acute posthemorrhagic anemia: Secondary | ICD-10-CM

## 2017-03-21 DIAGNOSIS — Z930 Tracheostomy status: Secondary | ICD-10-CM

## 2017-03-21 DIAGNOSIS — S32810S Multiple fractures of pelvis with stable disruption of pelvic ring, sequela: Secondary | ICD-10-CM

## 2017-03-21 DIAGNOSIS — S299XXA Unspecified injury of thorax, initial encounter: Secondary | ICD-10-CM

## 2017-03-21 DIAGNOSIS — E875 Hyperkalemia: Secondary | ICD-10-CM

## 2017-03-21 DIAGNOSIS — S2249XA Multiple fractures of ribs, unspecified side, initial encounter for closed fracture: Secondary | ICD-10-CM

## 2017-03-21 DIAGNOSIS — T148XXA Other injury of unspecified body region, initial encounter: Secondary | ICD-10-CM

## 2017-03-21 DIAGNOSIS — G8918 Other acute postprocedural pain: Secondary | ICD-10-CM

## 2017-03-21 DIAGNOSIS — S299XXD Unspecified injury of thorax, subsequent encounter: Secondary | ICD-10-CM

## 2017-03-21 DIAGNOSIS — E2749 Other adrenocortical insufficiency: Secondary | ICD-10-CM

## 2017-03-21 DIAGNOSIS — R0682 Tachypnea, not elsewhere classified: Secondary | ICD-10-CM

## 2017-03-21 DIAGNOSIS — S3282XD Multiple fractures of pelvis without disruption of pelvic ring, subsequent encounter for fracture with routine healing: Secondary | ICD-10-CM

## 2017-03-21 DIAGNOSIS — S32810D Multiple fractures of pelvis with stable disruption of pelvic ring, subsequent encounter for fracture with routine healing: Secondary | ICD-10-CM

## 2017-03-21 DIAGNOSIS — E785 Hyperlipidemia, unspecified: Secondary | ICD-10-CM

## 2017-03-21 DIAGNOSIS — E1121 Type 2 diabetes mellitus with diabetic nephropathy: Secondary | ICD-10-CM

## 2017-03-21 DIAGNOSIS — E119 Type 2 diabetes mellitus without complications: Secondary | ICD-10-CM

## 2017-03-21 DIAGNOSIS — I159 Secondary hypertension, unspecified: Secondary | ICD-10-CM

## 2017-03-21 DIAGNOSIS — IMO0002 Reserved for concepts with insufficient information to code with codable children: Secondary | ICD-10-CM

## 2017-03-21 DIAGNOSIS — J9690 Respiratory failure, unspecified, unspecified whether with hypoxia or hypercapnia: Secondary | ICD-10-CM

## 2017-03-21 LAB — CBC
HEMATOCRIT: 31.6 % — AB (ref 39.0–52.0)
HEMOGLOBIN: 10.2 g/dL — AB (ref 13.0–17.0)
MCH: 29.4 pg (ref 26.0–34.0)
MCHC: 32.3 g/dL (ref 30.0–36.0)
MCV: 91.1 fL (ref 78.0–100.0)
Platelets: 373 10*3/uL (ref 150–400)
RBC: 3.47 MIL/uL — ABNORMAL LOW (ref 4.22–5.81)
RDW: 15 % (ref 11.5–15.5)
WBC: 8.5 10*3/uL (ref 4.0–10.5)

## 2017-03-21 LAB — GLUCOSE, CAPILLARY
GLUCOSE-CAPILLARY: 132 mg/dL — AB (ref 65–99)
GLUCOSE-CAPILLARY: 136 mg/dL — AB (ref 65–99)
GLUCOSE-CAPILLARY: 139 mg/dL — AB (ref 65–99)
GLUCOSE-CAPILLARY: 186 mg/dL — AB (ref 65–99)
Glucose-Capillary: 137 mg/dL — ABNORMAL HIGH (ref 65–99)
Glucose-Capillary: 123 mg/dL — ABNORMAL HIGH (ref 65–99)

## 2017-03-21 LAB — BASIC METABOLIC PANEL
ANION GAP: 5 (ref 5–15)
BUN: 43 mg/dL — AB (ref 6–20)
CALCIUM: 8.5 mg/dL — AB (ref 8.9–10.3)
CO2: 29 mmol/L (ref 22–32)
Chloride: 102 mmol/L (ref 101–111)
Creatinine, Ser: 0.94 mg/dL (ref 0.61–1.24)
GFR calc Af Amer: >60 mL/min (ref >60)
Glucose, Bld: 133 mg/dL — ABNORMAL HIGH (ref 65–99)
POTASSIUM: 5.2 mmol/L — AB (ref 3.5–5.1)
SODIUM: 136 mmol/L (ref 135–145)

## 2017-03-21 MED ORDER — ENOXAPARIN SODIUM 40 MG/0.4ML ~~LOC~~ SOLN
40.0000 mg | SUBCUTANEOUS | Status: DC
  Administered 2017-03-23: 40 mg via SUBCUTANEOUS
  Filled 2017-03-21: qty 0.4

## 2017-03-21 NOTE — Consult Note (Signed)
[place] Nurse wound follow-up consult note Reason for Consult: Re-assessedright heel wound, refer to previous consult notes Wound type: Previous Dark purple deep tissue injury is dry and peels off easily, revealing pink dry scar tissue.  Protect with foam dressing and continue to float to reduce pressure. No family at bedside to discuss plan of care. Please re-consult if further assistance is needed. Thank-you,  Julien Girt MSN, [place], [place], [place], [place]

## 2017-03-21 NOTE — Progress Notes (Signed)
Rehab admissions - Please see rehab consult done by Dr. Posey Pronto recommending SNF placement.  Cannot tolerate 3 hours of therapy a day.  Ideally, patient needs LTACH.  Call me for questions.  #[phone]

## 2017-03-21 NOTE — Plan of Care (Signed)
Problem: Activity: Goal: Risk for activity intolerance will decrease Outcome: Progressing Passive ROM performed multiple times throughout shift.  Problem: Bowel/Gastric: Goal: Will not experience complications related to bowel motility Outcome: Completed/Met Date Met: 03/21/17 Patient having adequate stools.  Problem: Activity: Goal: Mobility will improve Outcome: Progressing Patient up to the chair daily with lift.
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
## 2017-03-21 NOTE — Progress Notes (Signed)
Foley d/c [id number]. At 1230 pt having urge to void but unable to do so. Dr. Louis Meckel contacted. Stated would send someone to reinsert foley until IR is able to insert suprapubic catheter. Will continue to monitor
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
## 2017-03-21 NOTE — Progress Notes (Signed)
Patient ID: Peter [name], male   DOB: September 17, 1960, 56 y.o.   MRN: [id number] I spoke with his wife and updated her on the current plan of care. Georganna Skeans, MD, MPH, FACS Trauma: [phone] General Surgery: [phone]
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
## 2017-03-22 ENCOUNTER — Inpatient Hospital Stay (HOSPITAL_COMMUNITY): Payer: BLUE CROSS/BLUE SHIELD

## 2017-03-22 ENCOUNTER — Encounter (HOSPITAL_COMMUNITY): Payer: Self-pay | Admitting: Interventional Radiology

## 2017-03-22 HISTORY — PX: IR FLUORO GUIDED NEEDLE PLC ASPIRATION/INJECTION LOC: IMG2395

## 2017-03-22 LAB — GLUCOSE, CAPILLARY
GLUCOSE-CAPILLARY: 123 mg/dL — AB (ref 65–99)
GLUCOSE-CAPILLARY: 147 mg/dL — AB (ref 65–99)
GLUCOSE-CAPILLARY: 155 mg/dL — AB (ref 65–99)
GLUCOSE-CAPILLARY: 93 mg/dL (ref 65–99)
Glucose-Capillary: 86 mg/dL (ref 65–99)
Glucose-Capillary: 128 mg/dL — ABNORMAL HIGH (ref 65–99)

## 2017-03-22 LAB — APTT: APTT: 31 s (ref 24–36)

## 2017-03-22 LAB — PROTIME-INR
INR: 1.08
Prothrombin Time: 13.9 seconds (ref 11.4–15.2)

## 2017-03-22 MED ORDER — LIDOCAINE HCL (PF) 1 % IJ SOLN
INTRAMUSCULAR | Status: AC | PRN
  Administered 2017-03-22: 5 mL

## 2017-03-22 MED ORDER — LIDOCAINE HCL 1 % IJ SOLN
INTRAMUSCULAR | Status: AC
  Filled 2017-03-22: qty 20

## 2017-03-22 MED ORDER — MIDAZOLAM HCL 2 MG/2ML IJ SOLN
INTRAMUSCULAR | Status: AC | PRN
  Administered 2017-03-22: 1 mg via INTRAVENOUS

## 2017-03-22 MED ORDER — FENTANYL CITRATE (PF) 100 MCG/2ML IJ SOLN
INTRAMUSCULAR | Status: AC | PRN
  Administered 2017-03-22: 50 ug via INTRAVENOUS

## 2017-03-22 MED ORDER — FENTANYL CITRATE (PF) 100 MCG/2ML IJ SOLN
INTRAMUSCULAR | Status: AC
  Filled 2017-03-22: qty 2

## 2017-03-22 MED ORDER — CEFAZOLIN SODIUM-DEXTROSE 2-4 GM/100ML-% IV SOLN
2.0000 g | Freq: Once | INTRAVENOUS | Status: AC
  Administered 2017-03-22: 2 g via INTRAVENOUS
  Filled 2017-03-22: qty 100

## 2017-03-22 MED ORDER — IOPAMIDOL (ISOVUE-300) INJECTION 61%
INTRAVENOUS | Status: AC
  Filled 2017-03-22: qty 50

## 2017-03-22 MED ORDER — MIDAZOLAM HCL 2 MG/2ML IJ SOLN
INTRAMUSCULAR | Status: AC
  Filled 2017-03-22: qty 2

## 2017-03-22 MED ORDER — CEFAZOLIN SODIUM-DEXTROSE 2-4 GM/100ML-% IV SOLN
INTRAVENOUS | Status: AC
  Administered 2017-03-22: 2 g via INTRAVENOUS
  Filled 2017-03-22: qty 100

## 2017-03-22 NOTE — Progress Notes (Signed)
Pt to IR at this time via bed.  Accompanied by myself and transport.  No s/s of any acute distress.

## 2017-03-22 NOTE — Sedation Documentation (Signed)
Patient is resting comfortably. 
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
## 2017-03-22 NOTE — Sedation Documentation (Signed)
Patient is resting comfortably.Denies pain move head left to right when asked if he has pain.

## 2017-03-22 NOTE — Progress Notes (Signed)
Trauma Service Note  Subjective: Patient just back from IR getting SP tube.  Still has Foley in place.  Objective: Vital signs in last 24 hours: Temp:  [98.3 F (36.8 C)-100.3 F (37.9 C)] 98.8 F (37.1 C) (10/03 0800) Pulse Rate:  [83-102] 94 (10/03 1111) Resp:  [11-31] 20 (10/03 1111) BP: (87-156)/(53-107) 142/82 (10/03 1034) SpO2:  [94 %-100 %] 100 % (10/03 1111) FiO2 (%):  [28 %-35 %] 28 % (10/03 1111) Weight:  [73.6 kg (162 lb 4.1 oz)] 73.6 kg (162 lb 4.1 oz) (10/03 0444) Last BM Date: 03/22/17  Intake/Output from previous day: 10/02 0701 - 10/03 0700 In: 1745 [I.V.:240; NG/GT:1505] Out: 3920 [Urine:3920] Intake/Output this shift: Total I/O In: 190 [I.V.:30; Other:60; IV Piggyback:100] Out: 350 [Urine:350]  General: No distress.  Seems tired and sleepy  Lungs: Coarse bilaterally, but seems to clear womewhat with productive coughing.  Abd: Benign and tolerating tube feedings   Extremities: No changes  Neuro: Intact  Urologic:  Foley still in place but to be removed today.  Has SP tube in place.  Lab Results: CBC   Recent Labs  03/21/17 0540  WBC 8.5  HGB 10.2*  HCT 31.6*  PLT 373   BMET  Recent Labs  03/21/17 0540  NA 136  K 5.2*  CL 102  CO2 29  GLUCOSE 133*  BUN 43*  CREATININE 0.94  CALCIUM 8.5*   PT/INR  Recent Labs  03/22/17 0856  LABPROT 13.9  INR 1.08   ABG No results for input(s): PHART, HCO3 in the last 72 hours.  Invalid input(s): PCO2, PO2  Studies/Results: No results found.  Anti-infectives: Anti-infectives    Start     Dose/Rate Route Frequency Ordered Stop   03/22/17 1000  ceFAZolin (ANCEF) IVPB 2g/100 mL premix     2 g 200 mL/hr over 30 Minutes Intravenous  Once 03/22/17 0933 03/22/17 1020   03/06/17 1600  ceFAZolin (ANCEF) IVPB 1 g/50 mL premix     1 g 100 mL/hr over 30 Minutes Intravenous Every 8 hours 03/06/17 1511 03/09/17 0629   02/19/17 2200  vancomycin (VANCOCIN) IVPB 750 mg/150 ml premix  Status:   Discontinued     750 mg 150 mL/hr over 60 Minutes Intravenous Every 12 hours 02/19/17 0807 02/20/17 1150   02/19/17 0900  vancomycin (VANCOCIN) 2,000 mg in sodium chloride 0.9 % 500 mL IVPB     2,000 mg 250 mL/hr over 120 Minutes Intravenous  Once 02/19/17 0807 02/19/17 1151   02/17/17 1100  cefTRIAXone (ROCEPHIN) 2 g in dextrose 5 % 50 mL IVPB  Status:  Discontinued     2 g 100 mL/hr over 30 Minutes Intravenous Every 24 hours 02/17/17 1041 02/19/17 0801   02/14/17 1500  piperacillin-tazobactam (ZOSYN) IVPB 3.375 g  Status:  Discontinued     3.375 g 12.5 mL/hr over 240 Minutes Intravenous Every 8 hours 02/14/17 1351 02/17/17 1041   02/11/17 1000  piperacillin-tazobactam (ZOSYN) IVPB 3.375 g  Status:  Discontinued     3.375 g 12.5 mL/hr over 240 Minutes Intravenous Every 8 hours 02/11/17 0858 02/14/17 1351   02/11/17 1000  vancomycin (VANCOCIN) IVPB 750 mg/150 ml premix  Status:  Discontinued     750 mg 150 mL/hr over 60 Minutes Intravenous Every 12 hours 02/11/17 0858 02/14/17 0844   02/07/17 2000  ceFAZolin (ANCEF) IVPB 1 g/50 mL premix     1 g 100 mL/hr over 30 Minutes Intravenous Every 8 hours 02/07/17 1416 02/08/17 1543  Assessment/Plan: s/p Procedure(s): PERCUTANEOUS TRACHEOSTOMY AT BEDSIDE CPM  DC Foley and Leave SP tube.in place.  LOS: 43 days   Kathryne Eriksson. Dahlia Bailiff, MD, FACS [phone] Trauma Surgeon 03/22/2017

## 2017-03-22 NOTE — Procedures (Signed)
Interventional Radiology Procedure Note  Procedure: Suprapubic bladder catheter placement  Complications: None  Estimated Blood Loss: None  12 Fr pigtail drain placed into bladder from anterior suprapubic approach.  Connected to gravity drainage bag.  Venetia Night. Kathlene Cote, M.D Pager:  [phone]

## 2017-03-22 NOTE — Progress Notes (Signed)
Received call from [facility] admissions liaison that insurance company has received appeal letter from facility, and is overturning their denial of [facility] has been approved by insurance for admission to [facility] on 03/23/17, pending medical readiness by MD.   I have called pt's and wife, and she is very happy with this news.  She is agreeable to transfer to [facility]    Reinaldo Raddle, RN, BSN  Trauma/Neuro ICU Case Manager [phone]
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
## 2017-03-23 ENCOUNTER — Inpatient Hospital Stay
Admission: RE | Admit: 2017-03-23 | Discharge: 2017-04-15 | Disposition: A | Discharge status: Rehabilitation Facility | Payer: Self-pay | Source: Ambulatory Visit | Attending: Internal Medicine | Admitting: Internal Medicine

## 2017-03-23 ENCOUNTER — Encounter (HOSPITAL_COMMUNITY): Payer: Self-pay

## 2017-03-23 ENCOUNTER — Inpatient Hospital Stay (HOSPITAL_COMMUNITY): Payer: BLUE CROSS/BLUE SHIELD

## 2017-03-23 ENCOUNTER — Other Ambulatory Visit (HOSPITAL_COMMUNITY): Payer: Self-pay

## 2017-03-23 DIAGNOSIS — J9690 Respiratory failure, unspecified, unspecified whether with hypoxia or hypercapnia: Secondary | ICD-10-CM

## 2017-03-23 DIAGNOSIS — A419 Sepsis, unspecified organism: Secondary | ICD-10-CM

## 2017-03-23 DIAGNOSIS — Z931 Gastrostomy status: Secondary | ICD-10-CM

## 2017-03-23 LAB — GLUCOSE, CAPILLARY
GLUCOSE-CAPILLARY: 113 mg/dL — AB (ref 65–99)
Glucose-Capillary: 112 mg/dL — ABNORMAL HIGH (ref 65–99)
Glucose-Capillary: 192 mg/dL — ABNORMAL HIGH (ref 65–99)

## 2017-03-23 MED ORDER — SODIUM CHLORIDE 0.9 % IV SOLN: 500.0000 mg | Freq: Two times a day (BID) | INTRAVENOUS | Status: DC

## 2017-03-23 MED ORDER — FREE WATER: 200.0000 mL | Freq: Four times a day (QID) | Status: DC

## 2017-03-23 MED ORDER — PNEUMOCOCCAL 13-VAL CONJ VACC IM SUSP
0.5000 mL | Freq: Once | INTRAMUSCULAR | Status: AC
  Administered 2017-03-23: 0.5 mL via INTRAMUSCULAR
  Filled 2017-03-23: qty 0.5

## 2017-03-23 MED ORDER — FERROUS SULFATE 220 (44 FE) MG/5ML PO ELIX: 220.0000 mg | ORAL_SOLUTION | Freq: Two times a day (BID) | ORAL | 3 refills | Status: DC

## 2017-03-23 MED ORDER — IOPAMIDOL (ISOVUE-300) INJECTION 61%
INTRAVENOUS | Status: AC
  Filled 2017-03-23: qty 50

## 2017-03-23 MED ORDER — BACITRACIN ZINC 500 UNIT/GM EX OINT: TOPICAL_OINTMENT | Freq: Two times a day (BID) | CUTANEOUS | 0 refills | Status: DC

## 2017-03-23 MED ORDER — PRO-STAT SUGAR FREE PO LIQD: 30.0000 mL | Freq: Two times a day (BID) | ORAL | 0 refills | Status: DC

## 2017-03-23 MED ORDER — HAEMOPHILUS B POLYSAC CONJ VAC IM SOLR
0.5000 mL | Freq: Once | INTRAMUSCULAR | Status: AC
  Administered 2017-03-23: 0.5 mL via INTRAMUSCULAR
  Filled 2017-03-23: qty 0.5

## 2017-03-23 MED ORDER — POLYETHYLENE GLYCOL 3350 17 G PO PACK: 17.0000 g | PACK | Freq: Two times a day (BID) | ORAL | 0 refills | Status: DC

## 2017-03-23 MED ORDER — CLONAZEPAM 0.5 MG PO TABS: 0.5000 mg | ORAL_TABLET | Freq: Every day | ORAL | 0 refills | Status: DC

## 2017-03-23 MED ORDER — MENINGOCOCCAL A C Y&W-135 OLIG IM SOLR
0.5000 mL | Freq: Once | INTRAMUSCULAR | Status: DC
  Filled 2017-03-23 (×2): qty 0.5

## 2017-03-23 MED ORDER — INSULIN ASPART 100 UNIT/ML ~~LOC~~ SOLN: 0.0000 [IU] | SUBCUTANEOUS | 11 refills | Status: DC

## 2017-03-23 MED ORDER — BISACODYL 10 MG RE SUPP: 10.0000 mg | Freq: Every day | RECTAL | 0 refills | Status: DC | PRN

## 2017-03-23 MED ORDER — GLUCERNA 1.2 CAL PO LIQD: 1000.0000 mL | ORAL | Status: DC

## 2017-03-23 MED ORDER — QUETIAPINE FUMARATE 100 MG PO TABS: 100.0000 mg | ORAL_TABLET | Freq: Every day | ORAL | Status: DC

## 2017-03-23 MED ORDER — DOCUSATE SODIUM 50 MG/5ML PO LIQD: 100.0000 mg | Freq: Two times a day (BID) | ORAL | 0 refills | Status: DC

## 2017-03-23 MED ORDER — HYDRALAZINE HCL 20 MG/ML IJ SOLN: 10.0000 mg | INTRAMUSCULAR | Status: DC | PRN

## 2017-03-23 MED ORDER — GUAIFENESIN 100 MG/5ML PO SOLN: 15.0000 mL | Freq: Four times a day (QID) | ORAL | 0 refills | Status: DC

## 2017-03-23 MED ORDER — PANTOPRAZOLE SODIUM 40 MG PO PACK: 40.0000 mg | PACK | ORAL | Status: DC

## 2017-03-23 MED ORDER — ENOXAPARIN SODIUM 40 MG/0.4ML ~~LOC~~ SOLN: 40.0000 mg | SUBCUTANEOUS | Status: DC

## 2017-03-23 MED ORDER — INSULIN GLARGINE 100 UNIT/ML ~~LOC~~ SOLN: 10.0000 [IU] | Freq: Every day | SUBCUTANEOUS | 11 refills | Status: DC

## 2017-03-23 MED ORDER — BETHANECHOL CHLORIDE 10 MG PO TABS: 10.0000 mg | ORAL_TABLET | Freq: Three times a day (TID) | ORAL | Status: DC

## 2017-03-23 MED ORDER — METOPROLOL TARTRATE 5 MG/5ML IV SOLN: 5.0000 mg | Freq: Three times a day (TID) | INTRAVENOUS | Status: DC
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
## 2017-03-23 NOTE — Progress Notes (Signed)
Report called to [facility] at [facility] and [facility] transported patient for pelvic xray prior to transporting to [facility]. Belongings sent with patient and wife at bedside. Arm splints and orthopedic shoes also sent with patient. VSS and no complaints from patient after transportation. Leanne Chang, RN
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
## 2017-03-23 NOTE — Clinical Social Work Note (Signed)
Clinical Social Worker following for possible SNF placement.  Patient insurance has overturned denial and has now approved LTAC hospital [facility]).  Patient wife aware and plans to discharge today.    CSW received order from [facility], requesting MD documentation of patient current mental status for guardianship hearing March 28, 2017.  CSW and MD completed requested letter and faxed back to guardian at litem.  CSW placed copy of guardianship order in patient shadow chart.  Clinical Social Worker will sign off for now as social work intervention is no longer needed. Please consult [place] again if new need arises.  Barbette Or, [place]
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
## 2017-03-24 LAB — CBC WITH DIFFERENTIAL/PLATELET
BASOS ABS: 0.1 10*3/uL (ref 0.0–0.1)
Basophils Relative: 1 %
EOS PCT: 5 %
Eosinophils Absolute: 0.5 10*3/uL (ref 0.0–0.7)
HEMATOCRIT: 33.9 % — AB (ref 39.0–52.0)
HEMOGLOBIN: 10.4 g/dL — AB (ref 13.0–17.0)
LYMPHS ABS: 2.2 10*3/uL (ref 0.7–4.0)
LYMPHS PCT: 21 %
MCH: 28.3 pg (ref 26.0–34.0)
MCHC: 30.7 g/dL (ref 30.0–36.0)
MCV: 92.1 fL (ref 78.0–100.0)
MONOS PCT: 10 %
Monocytes Absolute: 1 10*3/uL (ref 0.1–1.0)
Neutro Abs: 6.5 10*3/uL (ref 1.7–7.7)
Neutrophils Relative %: 63 %
Platelets: 412 10*3/uL — ABNORMAL HIGH (ref 150–400)
RBC: 3.68 MIL/uL — AB (ref 4.22–5.81)
RDW: 14.7 % (ref 11.5–15.5)
WBC: 10.3 10*3/uL (ref 4.0–10.5)

## 2017-03-24 LAB — COMPREHENSIVE METABOLIC PANEL
ALT: 48 U/L (ref 17–63)
ANION GAP: 5 (ref 5–15)
AST: 40 U/L (ref 15–41)
Albumin: 2.3 g/dL — ABNORMAL LOW (ref 3.5–5.0)
Alkaline Phosphatase: 398 U/L — ABNORMAL HIGH (ref 38–126)
BUN: 36 mg/dL — AB (ref 6–20)
CHLORIDE: 102 mmol/L (ref 101–111)
CO2: 27 mmol/L (ref 22–32)
Calcium: 8.5 mg/dL — ABNORMAL LOW (ref 8.9–10.3)
Creatinine, Ser: 0.97 mg/dL (ref 0.61–1.24)
GFR calc Af Amer: >60 mL/min (ref >60)
GFR calc non Af Amer: >60 mL/min (ref >60)
GLUCOSE: 191 mg/dL — AB (ref 65–99)
POTASSIUM: 4.9 mmol/L (ref 3.5–5.1)
Sodium: 134 mmol/L — ABNORMAL LOW (ref 135–145)
Total Bilirubin: 2 mg/dL — ABNORMAL HIGH (ref 0.3–1.2)
Total Protein: 7 g/dL (ref 6.5–8.1)

## 2017-03-31 ENCOUNTER — Other Ambulatory Visit (HOSPITAL_COMMUNITY): Payer: Self-pay

## 2017-03-31 LAB — BASIC METABOLIC PANEL
ANION GAP: 7 (ref 5–15)
BUN: 47 mg/dL — ABNORMAL HIGH (ref 6–20)
CHLORIDE: 106 mmol/L (ref 101–111)
CO2: 32 mmol/L (ref 22–32)
Calcium: 9.1 mg/dL (ref 8.9–10.3)
Creatinine, Ser: 0.92 mg/dL (ref 0.61–1.24)
GFR calc non Af Amer: >60 mL/min (ref >60)
Glucose, Bld: 241 mg/dL — ABNORMAL HIGH (ref 65–99)
POTASSIUM: 5 mmol/L (ref 3.5–5.1)
SODIUM: 145 mmol/L (ref 135–145)

## 2017-03-31 LAB — CBC
HEMATOCRIT: 38.2 % — AB (ref 39.0–52.0)
HEMOGLOBIN: 11.3 g/dL — AB (ref 13.0–17.0)
MCH: 28.5 pg (ref 26.0–34.0)
MCHC: 29.6 g/dL — AB (ref 30.0–36.0)
MCV: 96.2 fL (ref 78.0–100.0)
Platelets: 467 10*3/uL — ABNORMAL HIGH (ref 150–400)
RBC: 3.97 MIL/uL — AB (ref 4.22–5.81)
RDW: 14.8 % (ref 11.5–15.5)
WBC: 9.7 10*3/uL (ref 4.0–10.5)

## 2017-04-01 ENCOUNTER — Other Ambulatory Visit (HOSPITAL_COMMUNITY): Payer: Self-pay

## 2017-04-01 LAB — URINALYSIS, ROUTINE W REFLEX MICROSCOPIC
Bilirubin Urine: NEGATIVE
GLUCOSE, UA: NEGATIVE mg/dL
KETONES UR: NEGATIVE mg/dL
NITRITE: POSITIVE — AB
PH: 8 (ref 5.0–8.0)
PROTEIN: 100 mg/dL — AB
Specific Gravity, Urine: 1.014 (ref 1.005–1.030)
Squamous Epithelial / LPF: NONE SEEN

## 2017-04-02 LAB — URINE CULTURE

## 2017-04-02 LAB — MAGNESIUM: Magnesium: 2.3 mg/dL (ref 1.7–2.4)

## 2017-04-03 LAB — BASIC METABOLIC PANEL
Anion gap: 9 (ref 5–15)
BUN: 40 mg/dL — AB (ref 6–20)
CALCIUM: 8.5 mg/dL — AB (ref 8.9–10.3)
CO2: 29 mmol/L (ref 22–32)
CREATININE: 1.18 mg/dL (ref 0.61–1.24)
Chloride: 105 mmol/L (ref 101–111)
GFR calc Af Amer: >60 mL/min (ref >60)
GFR calc non Af Amer: >60 mL/min (ref >60)
GLUCOSE: 240 mg/dL — AB (ref 65–99)
Potassium: 3.7 mmol/L (ref 3.5–5.1)
Sodium: 143 mmol/L (ref 135–145)

## 2017-04-03 LAB — CULTURE, RESPIRATORY W GRAM STAIN

## 2017-04-03 LAB — CBC
HEMATOCRIT: 33.3 % — AB (ref 39.0–52.0)
Hemoglobin: 9.9 g/dL — ABNORMAL LOW (ref 13.0–17.0)
MCH: 28.2 pg (ref 26.0–34.0)
MCHC: 29.7 g/dL — AB (ref 30.0–36.0)
MCV: 94.9 fL (ref 78.0–100.0)
PLATELETS: 315 10*3/uL (ref 150–400)
RBC: 3.51 MIL/uL — ABNORMAL LOW (ref 4.22–5.81)
RDW: 15.1 % (ref 11.5–15.5)
WBC: 9.7 10*3/uL (ref 4.0–10.5)

## 2017-04-03 LAB — CULTURE, RESPIRATORY

## 2017-04-04 LAB — HEPARIN LEVEL (UNFRACTIONATED): Heparin Unfractionated: <0.1 IU/mL — ABNORMAL LOW (ref 0.30–0.70)
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
## 2017-04-11 LAB — BASIC METABOLIC PANEL
ANION GAP: 9 (ref 5–15)
BUN: 16 mg/dL (ref 6–20)
CHLORIDE: 98 mmol/L — AB (ref 101–111)
CO2: 28 mmol/L (ref 22–32)
Calcium: 8.7 mg/dL — ABNORMAL LOW (ref 8.9–10.3)
Creatinine, Ser: 0.88 mg/dL (ref 0.61–1.24)
GFR calc non Af Amer: >60 mL/min (ref >60)
Glucose, Bld: 146 mg/dL — ABNORMAL HIGH (ref 65–99)
POTASSIUM: 3.9 mmol/L (ref 3.5–5.1)
Sodium: 135 mmol/L (ref 135–145)

## 2017-04-11 LAB — CBC
HEMATOCRIT: 32.6 % — AB (ref 39.0–52.0)
HEMOGLOBIN: 10.5 g/dL — AB (ref 13.0–17.0)
MCH: 29.2 pg (ref 26.0–34.0)
MCHC: 32.2 g/dL (ref 30.0–36.0)
MCV: 90.6 fL (ref 78.0–100.0)
Platelets: 374 10*3/uL (ref 150–400)
RBC: 3.6 MIL/uL — AB (ref 4.22–5.81)
RDW: 15.1 % (ref 11.5–15.5)
WBC: 8.1 10*3/uL (ref 4.0–10.5)

## 2017-04-15 ENCOUNTER — Encounter (HOSPITAL_COMMUNITY): Payer: Self-pay

## 2017-04-15 ENCOUNTER — Inpatient Hospital Stay (HOSPITAL_COMMUNITY)
Admission: AC | Admit: 2017-04-15 | Discharge: 2017-05-19 | DRG: 092 | Disposition: A | Discharge status: Home Health | Payer: BLUE CROSS/BLUE SHIELD | Source: Intra-hospital | Attending: Physical Medicine & Rehabilitation | Admitting: Physical Medicine & Rehabilitation

## 2017-04-15 ENCOUNTER — Inpatient Hospital Stay (HOSPITAL_COMMUNITY): Payer: BLUE CROSS/BLUE SHIELD

## 2017-04-15 DIAGNOSIS — G6281 Critical illness polyneuropathy: Secondary | ICD-10-CM

## 2017-04-15 DIAGNOSIS — Z9359 Other cystostomy status: Secondary | ICD-10-CM

## 2017-04-15 DIAGNOSIS — G8220 Paraplegia, unspecified: Secondary | ICD-10-CM | POA: Diagnosis not present

## 2017-04-15 DIAGNOSIS — R2689 Other abnormalities of gait and mobility: Secondary | ICD-10-CM | POA: Diagnosis present

## 2017-04-15 DIAGNOSIS — R030 Elevated blood-pressure reading, without diagnosis of hypertension: Secondary | ICD-10-CM | POA: Diagnosis not present

## 2017-04-15 DIAGNOSIS — B964 Proteus (mirabilis) (morganii) as the cause of diseases classified elsewhere: Secondary | ICD-10-CM | POA: Diagnosis not present

## 2017-04-15 DIAGNOSIS — S3730XD Unspecified injury of urethra, subsequent encounter: Secondary | ICD-10-CM

## 2017-04-15 DIAGNOSIS — Z9889 Other specified postprocedural states: Secondary | ICD-10-CM | POA: Diagnosis not present

## 2017-04-15 DIAGNOSIS — D62 Acute posthemorrhagic anemia: Secondary | ICD-10-CM | POA: Diagnosis present

## 2017-04-15 DIAGNOSIS — E1165 Type 2 diabetes mellitus with hyperglycemia: Secondary | ICD-10-CM | POA: Diagnosis not present

## 2017-04-15 DIAGNOSIS — N390 Urinary tract infection, site not specified: Secondary | ICD-10-CM | POA: Diagnosis not present

## 2017-04-15 DIAGNOSIS — M7989 Other specified soft tissue disorders: Secondary | ICD-10-CM | POA: Diagnosis not present

## 2017-04-15 DIAGNOSIS — G7281 Critical illness myopathy: Secondary | ICD-10-CM | POA: Diagnosis present

## 2017-04-15 DIAGNOSIS — S06303S Unspecified focal traumatic brain injury with loss of consciousness of 1 hour to 5 hours 59 minutes, sequela: Secondary | ICD-10-CM

## 2017-04-15 DIAGNOSIS — K219 Gastro-esophageal reflux disease without esophagitis: Secondary | ICD-10-CM | POA: Diagnosis not present

## 2017-04-15 DIAGNOSIS — R140 Abdominal distension (gaseous): Secondary | ICD-10-CM

## 2017-04-15 DIAGNOSIS — T07XXXA Unspecified multiple injuries, initial encounter: Secondary | ICD-10-CM | POA: Diagnosis not present

## 2017-04-15 DIAGNOSIS — E1121 Type 2 diabetes mellitus with diabetic nephropathy: Secondary | ICD-10-CM

## 2017-04-15 DIAGNOSIS — E119 Type 2 diabetes mellitus without complications: Secondary | ICD-10-CM

## 2017-04-15 DIAGNOSIS — R339 Retention of urine, unspecified: Secondary | ICD-10-CM | POA: Diagnosis present

## 2017-04-15 DIAGNOSIS — Z87820 Personal history of traumatic brain injury: Secondary | ICD-10-CM

## 2017-04-15 DIAGNOSIS — R5381 Other malaise: Secondary | ICD-10-CM | POA: Diagnosis present

## 2017-04-15 DIAGNOSIS — K567 Ileus, unspecified: Secondary | ICD-10-CM | POA: Diagnosis not present

## 2017-04-15 DIAGNOSIS — S069X3S Unspecified intracranial injury with loss of consciousness of 1 hour to 5 hours 59 minutes, sequela: Secondary | ICD-10-CM

## 2017-04-15 DIAGNOSIS — S2243XD Multiple fractures of ribs, bilateral, subsequent encounter for fracture with routine healing: Secondary | ICD-10-CM | POA: Diagnosis not present

## 2017-04-15 DIAGNOSIS — S32810S Multiple fractures of pelvis with stable disruption of pelvic ring, sequela: Secondary | ICD-10-CM | POA: Diagnosis not present

## 2017-04-15 DIAGNOSIS — S22069A Unspecified fracture of T7-T8 vertebra, initial encounter for closed fracture: Secondary | ICD-10-CM

## 2017-04-15 DIAGNOSIS — Z931 Gastrostomy status: Secondary | ICD-10-CM

## 2017-04-15 DIAGNOSIS — S22069D Unspecified fracture of T7-T8 vertebra, subsequent encounter for fracture with routine healing: Secondary | ICD-10-CM | POA: Diagnosis not present

## 2017-04-15 DIAGNOSIS — IMO0002 Reserved for concepts with insufficient information to code with codable children: Secondary | ICD-10-CM

## 2017-04-15 DIAGNOSIS — T1490XS Injury, unspecified, sequela: Secondary | ICD-10-CM | POA: Diagnosis not present

## 2017-04-15 DIAGNOSIS — S329XXD Fracture of unspecified parts of lumbosacral spine and pelvis, subsequent encounter for fracture with routine healing: Secondary | ICD-10-CM | POA: Diagnosis not present

## 2017-04-15 DIAGNOSIS — E11649 Type 2 diabetes mellitus with hypoglycemia without coma: Secondary | ICD-10-CM | POA: Diagnosis not present

## 2017-04-15 DIAGNOSIS — O161 Unspecified maternal hypertension, first trimester: Secondary | ICD-10-CM

## 2017-04-15 DIAGNOSIS — R4189 Other symptoms and signs involving cognitive functions and awareness: Secondary | ICD-10-CM | POA: Diagnosis present

## 2017-04-15 DIAGNOSIS — S22069S Unspecified fracture of T7-T8 vertebra, sequela: Secondary | ICD-10-CM | POA: Diagnosis not present

## 2017-04-15 DIAGNOSIS — R000 Tachycardia, unspecified: Secondary | ICD-10-CM

## 2017-04-15 LAB — COMPREHENSIVE METABOLIC PANEL
ALBUMIN: 2.5 g/dL — AB (ref 3.5–5.0)
ALT: 23 U/L (ref 17–63)
AST: 18 U/L (ref 15–41)
Alkaline Phosphatase: 213 U/L — ABNORMAL HIGH (ref 38–126)
Anion gap: 8 (ref 5–15)
BILIRUBIN TOTAL: 0.9 mg/dL (ref 0.3–1.2)
BUN: 36 mg/dL — AB (ref 6–20)
CO2: 27 mmol/L (ref 22–32)
Calcium: 8.8 mg/dL — ABNORMAL LOW (ref 8.9–10.3)
Chloride: 99 mmol/L — ABNORMAL LOW (ref 101–111)
Creatinine, Ser: 0.88 mg/dL (ref 0.61–1.24)
GFR calc Af Amer: >60 mL/min (ref >60)
GFR calc non Af Amer: >60 mL/min (ref >60)
GLUCOSE: 180 mg/dL — AB (ref 65–99)
POTASSIUM: 4.8 mmol/L (ref 3.5–5.1)
Sodium: 134 mmol/L — ABNORMAL LOW (ref 135–145)
TOTAL PROTEIN: 7.1 g/dL (ref 6.5–8.1)

## 2017-04-15 LAB — CBC WITH DIFFERENTIAL/PLATELET
BASOS ABS: 0 10*3/uL (ref 0.0–0.1)
BASOS PCT: 0 %
Eosinophils Absolute: 0.3 10*3/uL (ref 0.0–0.7)
Eosinophils Relative: 3 %
HEMATOCRIT: 34 % — AB (ref 39.0–52.0)
HEMOGLOBIN: 10.9 g/dL — AB (ref 13.0–17.0)
Lymphocytes Relative: 22 %
Lymphs Abs: 2.2 10*3/uL (ref 0.7–4.0)
MCH: 29 pg (ref 26.0–34.0)
MCHC: 32.1 g/dL (ref 30.0–36.0)
MCV: 90.4 fL (ref 78.0–100.0)
Monocytes Absolute: 0.9 10*3/uL (ref 0.1–1.0)
Monocytes Relative: 9 %
NEUTROS ABS: 6.3 10*3/uL (ref 1.7–7.7)
NEUTROS PCT: 66 %
Platelets: 411 10*3/uL — ABNORMAL HIGH (ref 150–400)
RBC: 3.76 MIL/uL — ABNORMAL LOW (ref 4.22–5.81)
RDW: 14.9 % (ref 11.5–15.5)
WBC: 9.6 10*3/uL (ref 4.0–10.5)

## 2017-04-15 LAB — GLUCOSE, CAPILLARY
Glucose-Capillary: 186 mg/dL — ABNORMAL HIGH (ref 65–99)
Glucose-Capillary: 201 mg/dL — ABNORMAL HIGH (ref 65–99)

## 2017-04-15 MED ORDER — ENOXAPARIN SODIUM 40 MG/0.4ML ~~LOC~~ SOLN: 40.0000 mg | SUBCUTANEOUS | Status: DC

## 2017-04-15 MED ORDER — FAMOTIDINE 20 MG PO TABS
20.0000 mg | ORAL_TABLET | Freq: Two times a day (BID) | ORAL | Status: DC
  Administered 2017-04-15 – 2017-05-19 (×68): 20 mg via ORAL
  Filled 2017-04-15 (×68): qty 1

## 2017-04-15 MED ORDER — OXYCODONE HCL 5 MG PO TABS
5.0000 mg | ORAL_TABLET | Freq: Four times a day (QID) | ORAL | Status: DC | PRN
  Administered 2017-04-20 – 2017-04-26 (×3): 5 mg via ORAL
  Filled 2017-04-15 (×5): qty 1

## 2017-04-15 MED ORDER — TRAZODONE HCL 50 MG PO TABS
50.0000 mg | ORAL_TABLET | Freq: Every day | ORAL | Status: DC
  Administered 2017-04-15 – 2017-04-19 (×5): 50 mg via ORAL
  Filled 2017-04-15 (×5): qty 1

## 2017-04-15 MED ORDER — QUETIAPINE FUMARATE 100 MG PO TABS
100.0000 mg | ORAL_TABLET | Freq: Every day | ORAL | Status: DC
  Administered 2017-04-15 – 2017-04-24 (×10): 100 mg via ORAL
  Filled 2017-04-15 (×10): qty 1

## 2017-04-15 MED ORDER — METOPROLOL TARTRATE 12.5 MG HALF TABLET
12.5000 mg | ORAL_TABLET | Freq: Two times a day (BID) | ORAL | Status: DC
  Administered 2017-04-15 – 2017-04-21 (×12): 12.5 mg via ORAL
  Filled 2017-04-15 (×12): qty 1

## 2017-04-15 MED ORDER — ENOXAPARIN SODIUM 40 MG/0.4ML ~~LOC~~ SOLN
40.0000 mg | SUBCUTANEOUS | Status: DC
  Administered 2017-04-15 – 2017-05-18 (×33): 40 mg via SUBCUTANEOUS
  Filled 2017-04-15 (×34): qty 0.4

## 2017-04-15 MED ORDER — MODAFINIL 100 MG PO TABS
100.0000 mg | ORAL_TABLET | Freq: Every day | ORAL | Status: DC
Start: 2017-04-16 — End: 2017-05-15
  Administered 2017-04-16 – 2017-05-15 (×30): 100 mg via ORAL
  Filled 2017-04-15 (×30): qty 1

## 2017-04-15 MED ORDER — CLONAZEPAM 0.5 MG PO TABS
0.5000 mg | ORAL_TABLET | Freq: Every day | ORAL | Status: DC
  Administered 2017-04-15 – 2017-04-24 (×10): 0.5 mg via ORAL
  Filled 2017-04-15 (×10): qty 1

## 2017-04-15 MED ORDER — FERROUS SULFATE 325 (65 FE) MG PO TABS
325.0000 mg | ORAL_TABLET | Freq: Two times a day (BID) | ORAL | Status: DC
Start: 2017-04-15 — End: 2017-05-19
  Administered 2017-04-15 – 2017-05-19 (×66): 325 mg via ORAL
  Filled 2017-04-15 (×67): qty 1

## 2017-04-15 MED ORDER — LEVETIRACETAM 500 MG PO TABS
500.0000 mg | ORAL_TABLET | Freq: Two times a day (BID) | ORAL | Status: DC
  Administered 2017-04-15 – 2017-05-19 (×68): 500 mg via ORAL
  Filled 2017-04-15 (×68): qty 1

## 2017-04-15 MED ORDER — SERTRALINE HCL 50 MG PO TABS
75.0000 mg | ORAL_TABLET | Freq: Every day | ORAL | Status: DC
  Administered 2017-04-16 – 2017-05-19 (×34): 75 mg via ORAL
  Filled 2017-04-15 (×35): qty 2

## 2017-04-15 MED ORDER — INSULIN ASPART 100 UNIT/ML ~~LOC~~ SOLN
0.0000 [IU] | Freq: Three times a day (TID) | SUBCUTANEOUS | Status: DC
  Administered 2017-04-16 – 2017-04-17 (×3): 3 [IU] via SUBCUTANEOUS
  Administered 2017-04-17: 2 [IU] via SUBCUTANEOUS
  Administered 2017-04-17 – 2017-04-19 (×4): 3 [IU] via SUBCUTANEOUS
  Administered 2017-04-19 – 2017-04-20 (×2): 5 [IU] via SUBCUTANEOUS
  Administered 2017-04-20 – 2017-04-22 (×5): 3 [IU] via SUBCUTANEOUS
  Administered 2017-04-23: 2 [IU] via SUBCUTANEOUS
  Administered 2017-04-24: 3 [IU] via SUBCUTANEOUS
  Administered 2017-04-25: 2 [IU] via SUBCUTANEOUS

## 2017-04-15 MED ORDER — AMANTADINE HCL 100 MG PO CAPS
100.0000 mg | ORAL_CAPSULE | Freq: Every day | ORAL | Status: DC
Start: 2017-04-15 — End: 2017-05-18
  Administered 2017-04-16 – 2017-05-17 (×32): 100 mg via ORAL
  Filled 2017-04-15 (×33): qty 1

## 2017-04-15 MED ORDER — DOCUSATE SODIUM 100 MG PO CAPS
100.0000 mg | ORAL_CAPSULE | Freq: Two times a day (BID) | ORAL | Status: DC
  Administered 2017-04-15 – 2017-04-16 (×3): 100 mg via ORAL
  Filled 2017-04-15 (×4): qty 1

## 2017-04-15 MED ORDER — BETHANECHOL CHLORIDE 10 MG PO TABS
10.0000 mg | ORAL_TABLET | Freq: Three times a day (TID) | ORAL | Status: DC
  Administered 2017-04-15 – 2017-04-25 (×29): 10 mg via ORAL
  Filled 2017-04-15 (×28): qty 1

## 2017-04-15 MED ORDER — INSULIN ASPART 100 UNIT/ML ~~LOC~~ SOLN
0.0000 [IU] | Freq: Every day | SUBCUTANEOUS | Status: DC
  Administered 2017-04-15 – 2017-04-16 (×2): 2 [IU] via SUBCUTANEOUS

## 2017-04-15 NOTE — Progress Notes (Signed)
Patient and family noted CBGs were taken for patient on [facility], history of diabetes. CBG of 186 collected. MD called and informed. Verbal order to add [facility] glucose monitoring. No other orders given, will be addressed per MD.
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
## 2017-04-15 NOTE — Progress Notes (Signed)
Patient unsure if given flu shot while at [facility]. Per pharmacy, pt given PNA vaccine while at [facility]. Will follow up on flu shot 10/28 with paper chart.

## 2017-04-15 NOTE — Progress Notes (Signed)
Patient arrived with family members to unit. Oriented to unit. Questions answered. No pain. Resting comfortably will call bell in place

## 2017-04-16 ENCOUNTER — Inpatient Hospital Stay (HOSPITAL_COMMUNITY): Payer: BLUE CROSS/BLUE SHIELD

## 2017-04-16 ENCOUNTER — Inpatient Hospital Stay (HOSPITAL_COMMUNITY): Payer: BLUE CROSS/BLUE SHIELD | Admitting: Occupational Therapy

## 2017-04-16 DIAGNOSIS — S32810S Multiple fractures of pelvis with stable disruption of pelvic ring, sequela: Secondary | ICD-10-CM

## 2017-04-16 DIAGNOSIS — Z931 Gastrostomy status: Secondary | ICD-10-CM

## 2017-04-16 DIAGNOSIS — M7989 Other specified soft tissue disorders: Secondary | ICD-10-CM

## 2017-04-16 LAB — GLUCOSE, CAPILLARY
GLUCOSE-CAPILLARY: 108 mg/dL — AB (ref 65–99)
GLUCOSE-CAPILLARY: 182 mg/dL — AB (ref 65–99)
GLUCOSE-CAPILLARY: 187 mg/dL — AB (ref 65–99)
GLUCOSE-CAPILLARY: 208 mg/dL — AB (ref 65–99)
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
## 2017-04-16 NOTE — Progress Notes (Signed)
Catheter bag leaking. Seal on bag was already broken. Replaced foley bag with new bag.
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
## 2017-04-17 ENCOUNTER — Inpatient Hospital Stay (HOSPITAL_COMMUNITY): Payer: BLUE CROSS/BLUE SHIELD

## 2017-04-17 ENCOUNTER — Inpatient Hospital Stay (HOSPITAL_COMMUNITY): Payer: BLUE CROSS/BLUE SHIELD | Admitting: Speech Pathology

## 2017-04-17 ENCOUNTER — Inpatient Hospital Stay (HOSPITAL_COMMUNITY): Payer: BLUE CROSS/BLUE SHIELD | Admitting: Occupational Therapy

## 2017-04-17 LAB — GLUCOSE, CAPILLARY
GLUCOSE-CAPILLARY: 156 mg/dL — AB (ref 65–99)
GLUCOSE-CAPILLARY: 165 mg/dL — AB (ref 65–99)
GLUCOSE-CAPILLARY: 135 mg/dL — AB (ref 65–99)
Glucose-Capillary: 129 mg/dL — ABNORMAL HIGH (ref 65–99)

## 2017-04-17 MED ORDER — PRO-STAT SUGAR FREE PO LIQD
30.0000 mL | Freq: Two times a day (BID) | ORAL | Status: DC
  Administered 2017-04-17 – 2017-05-19 (×60): 30 mL via ORAL
  Filled 2017-04-17 (×63): qty 30

## 2017-04-17 MED ORDER — GLIMEPIRIDE 2 MG PO TABS
1.0000 mg | ORAL_TABLET | Freq: Every day | ORAL | Status: DC
  Administered 2017-04-17 – 2017-04-18 (×2): 1 mg via ORAL
  Filled 2017-04-17 (×3): qty 1

## 2017-04-17 MED ORDER — SORBITOL 70 % SOLN
30.0000 mL | Freq: Every day | Status: DC | PRN
  Administered 2017-04-17 – 2017-04-26 (×5): 30 mL via ORAL
  Filled 2017-04-17 (×6): qty 30

## 2017-04-17 MED ORDER — BISACODYL 10 MG RE SUPP
10.0000 mg | Freq: Every day | RECTAL | Status: DC | PRN
  Administered 2017-04-18 – 2017-04-20 (×2): 10 mg via RECTAL
  Filled 2017-04-17 (×2): qty 1

## 2017-04-17 MED ORDER — SENNOSIDES-DOCUSATE SODIUM 8.6-50 MG PO TABS
2.0000 | ORAL_TABLET | Freq: Every day | ORAL | Status: DC
  Administered 2017-04-17 – 2017-04-27 (×11): 2 via ORAL
  Filled 2017-04-17 (×11): qty 2
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
## 2017-04-17 NOTE — Progress Notes (Signed)
Occupational Therapy Session Note  Patient Details  Name: Peter [name] MRN: [id number] Date of Birth: [birth year]  Today's Date: 04/17/2017 OT Individual Time: 4665-9935 and 1111-1209 OT Individual Time Calculation (min): 71 min and 58 min  Short Term Goals: Week 1:  OT Short Term Goal 1 (Week 1): Pt will complete self feeding with Mod A and AE PRN OT Short Term Goal 2 (Week 1): Pt will complete LB dressing at sit<stand level with LRAD OT Short Term Goal 3 (Week 1): Pt will BSC transfer with 1 helper and LRAD  Skilled Therapeutic Interventions/Progress Updates:    Tx focus on UE NMR during self feeding.   Pt greeted supine in bed, requesting to eat breakfast. Pt positioned in bed to obtain neutral alignment with HOB elevated (bed vs EOB to decrease combined trunk control demands of task). Pt using built up utensils, HOH for integrating R UE when cutting pancakes and scrambled eggs. HOH for bilaterally integrating UEs to bring beverages to mouth. Pt requiring mod cues for attending to task due to tangential tendencies. Afterwards worked on UE strengthening/ROM by having pt lift arms with clasped hands and tolerating applied graded resistance in gravity maximized and minimized planes. At end of tx pt was repositioned for comfort and left with soft call bell in lap.   2nd Session 1:1 tx (58 min) Tx focus on functional transfers, balance, standing tolerance, and ADL retraining.   Pt greeted supine in bed with daughter Claiborne Billings present. Agreeable to bathing/dressing. Pt achieving supine<sit with Max A (bed deflated due to pt slipping forward with firmest setting on air mattress). Pt able to maintain static sitting balance with unilateral support during UB self care. Often lost balance posteriorly without UE support and required manual/verbal cues for anterior weight shifting. Max A for donning overhead shirt due to pt c/o neck pain while sitting unsupported (addressed this with RN). Sit<stand for  pericare using Sara+. Pt exhibiting neutral alignment in standing for approx 2 minutes before jutting Rt hip out laterally. Had 2 assist for safety. Pt reported feeling very fatigued post standing, and that his neck was still bothering him. Completed brief change/LB dressing in supine while rolling Rt>Lt with Mod A (neck pain subsided per report). Pt able to boost himself up in bed when it was placed in trendeleberg and LEs were stabilized. He then transferred to recliner with use of Sara+ and 2 helpers. Pt was repositioned for comfort in recliner, reported neck pain still no longer present. He was left with daughter at session exit.   Therapy Documentation Precautions:  Precautions Precautions: Fall Precaution Comments: trach, PEG, catheter  Restrictions Weight Bearing Restrictions: Yes RLE Weight Bearing: Weight bearing as tolerated LLE Weight Bearing: Weight bearing as tolerated Other Position/Activity Restrictions: no ROM restrictions bil LE Vital Signs: Therapy Vitals Pulse Rate: (!) 108 BP: (!) 138/99 Pain: Explained/addressed above  Pain Assessment Pain Assessment: No/denies pain ADL: ADL ADL Comments: Please see functional navigator for ADL status     See Function Navigator for Current Functional Status.   Therapy/Group: Individual Therapy  Carnelia Oscar A Samanthan Dugo 04/17/2017, 12:25 PM
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
## 2017-04-17 NOTE — Progress Notes (Signed)
[place] PHYSICAL MEDICINE & REHABILITATION     PROGRESS NOTE    Subjective/Complaints: Appreciated air mattress. Slept ok. Eating breakfast awaiting therapy  ROS: pt denies nausea, vomiting, diarrhea, cough, shortness of breath or chest pain   Objective: Vital Signs: Blood pressure (!) 143/89, pulse 100, temperature 98.3 F (36.8 C), temperature source Oral, resp. rate 20, height 5\' 10"  (1.778 m), weight 68.3 kg (150 lb 9.2 oz), SpO2 99 %. No results found.  Recent Labs  04/15/17 1613  WBC 9.6  HGB 10.9*  HCT 34.0*  PLT 411*    Recent Labs  04/15/17 1613  NA 134*  K 4.8  CL 99*  GLUCOSE 180*  BUN 36*  CREATININE 0.88  CALCIUM 8.8*   CBG (last 3)   Recent Labs  04/16/17 1615 04/16/17 2101 04/17/17 0627  GLUCAP 187* 208* 156*    Wt Readings from Last 3 Encounters:  04/15/17 68.3 kg (150 lb 9.2 oz)  03/23/17 74.4 kg (164 lb 0.4 oz)    Physical Exam:  Constitutional: frail appearing.  HENT:  Head: Normocephalicand atraumatic.  Eyes: Pupils are equal, round, and reactive to light. EOMare normal.  Neck: Normal range of motion. Neck supple. No thyromegalypresent.  Trach stoma closed,granulated Cardiovascular: RRR without murmur. No JVD  Respiratory: CTA Bilaterally without wheezes or rales. Normal effort   GI: Soft. Bowel sounds are normal. He exhibits no distension. There is no tenderness. There is no rebound.  Gastrostomy tube in place Genitourinary:  Genitourinary Comments: Alice place and draining Skin. Warm and dry, thin Musculoskeletal. Patient with +1 edema to the right hand. Palpable pedal pulses lower extremities Genitourinary. Suprapubic tube in place Neurological. Awake and alert. Decreased insight and awareness. Oriented to place, person, month, reason he's here. UE 3+/5 Deltoid, biceps, triceps and 4- HI. LE: 2/5 HF, KEand 3+ ADF/PF. No sensory deficits.stable motor exanm.  Psych: cooperative and appropriate  Assessment/Plan: 1.  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 Physiatrist is providing close team supervision and 24 hour management of active medical problems listed below. Physiatrist and rehab team continue to assess barriers to discharge/monitor patient progress toward functional and medical goals.  Function:  Bathing Bathing position   Position: Other (comment) (EOB UB bedlevel LB)  Bathing parts Body parts bathed by patient: Chest, Abdomen Body parts bathed by helper: Right arm, Left arm, Front perineal area, Buttocks, Right upper leg, Left upper leg, Right lower leg, Left lower leg, Back  Bathing assist Assist Level: 2 helpers      Upper Body Dressing/Undressing Upper body dressing   What is the patient wearing?: Pull over shirt/dress     Pull over shirt/dress - Perfomed by patient: Thread/unthread left sleeve Pull over shirt/dress - Perfomed by helper: Thread/unthread right sleeve, Put head through opening, Pull shirt over trunk        Upper body assist        Lower Body Dressing/Undressing Lower body dressing   What is the patient wearing?: Pants, Non-skid slipper socks       Pants- Performed by helper: Thread/unthread right pants leg, Thread/unthread left pants leg, Pull pants up/down Non-skid slipper socks- Performed by patient: Don/doff right sock, Don/doff left sock                    Lower body assist Assist for lower body dressing: 2 Medical sales representative  Toileting steps completed by helper: Adjust clothing prior to toileting, Performs perineal hygiene, Adjust clothing after toileting    Toileting assist Assist level: Two helpers   Transfers Chair/bed transfer   Chair/bed transfer method: Lateral scoot Chair/bed transfer assist level: Maximal assist (Pt 25 - 49%/lift and lower) Chair/bed transfer assistive device:  Sliding board     Locomotion Ambulation Ambulation activity did not occur: Safety/medical concerns         Wheelchair   Type: Manual Max wheelchair distance: 55 ft Assist Level: Touching or steadying assistance (Pt > 75%)  Cognition Comprehension Comprehension assist level: Understands basic 90% of the time/cues < 10% of the time  Expression Expression assist level: Expresses basic 90% of the time/requires cueing < 10% of the time.  Social Interaction Social Interaction assist level: Interacts appropriately 90% of the time - Needs monitoring or encouragement for participation or interaction.  Problem Solving Problem solving assist level: Solves basic 90% of the time/requires cueing < 10% of the time  Memory Memory assist level: Recognizes or recalls 90% of the time/requires cueing < 10% of the time   Medical Problem List and Plan: 1. Decreased functional mobility with cognitive deficitssecondary to TBI/polytrauma 02/07/2017, additional deconditioning due to prolonged hospital stay -continue therapies 2. DVT Prophylaxis/Anticoagulation: Subcutaneous Lovenox. Check vascular study pending 3. Pain Management: Oxycodone 5 mg every 6 hours as needed -consider pre-treatment prior to therapies but limit meds given cognition when possible 4. Mood: Clonazepam 0.5 mg daily at bedtime, trazodone 50 mg daily at bedtime, Seroquel 100 mg daily at bedtime, Zoloft 50 mg daily, amantadine 100 mg daily, Provigil 100 mg daily 5. Neuropsych: This patient iscapable of making decisions on hisown behalf. 6. Skin/Wound Care: Routine skin checks  -  air mattress due to sensitive skin 7. Fluids/Electrolytes/Nutrition: Routine I&O with follow-up chemistries 8.Nondisplaced fractures of right T7 transverse process. Conservative care 9.Multiple pelvic fractures/bilateral sacroiliac joints and suprapubic symphysis as well as left pelvic sidewall hematoma. Status post sacroiliac screw  fixation with external fixation pelvis anterior pelvic ring closed reduction 02/07/2017. Presently weightbearing as tolerated bilateral lower extremities not ambulate more than 10 feet at a time and as of 04/18/2017 Ambulate extended distances weightbearing as tolerated without restrictions. 10.Seizure prophylaxis. Keppra 500 mg every 12 hours. EEG negative 11.Tracheostomy tube 03/01/2017 per Dr. Hulen Skains. Decannulated. Continue occlusive dressing--wound closing 12.Gastrostomy tube 03/01/2017 per Dr. Hulen Skains. Presently not receiving tube feeds. Diet advanced to mechanical soft and liquids. 13.Urinary retention/urethral injury. Status post suprapubic catheter placement 03/21/2017 per interventional radiology. Follow-up urology services Dr. Louis Meckel 14.Acute blood loss anemia. Follow-up CBC. Continue iron supplement 15. DM 2: SSI, CBG's TID and HS  -on CM diet  -add amaryl   LOS (Days) 2 A FACE TO FACE EVALUATION WAS PERFORMED  [name], MD 04/17/2017 8:49 AM

## 2017-04-17 NOTE — Progress Notes (Addendum)
Physical Therapy Note  Patient Details  Name: Peter [name] MRN: [id number] Date of Birth: 11-Mar-1961 Today's Date: 04/17/2017  1300-1345, 45 min individual tx Pain: none per pt  neuromuscular re-education via multimodal cues, demo for trunk and LEs to facilitate transfers and standing.   Seated trunk flex/ext, resisted hip abd L/R, bil hip abd, sustained stretch bil hip abd, long arc quad knee ext R/L, eccentric cotnrol when lowering, active assistive bil shoulder adduction after manual therapy to increase thoracic extension.   See function navigator for current status.  Pt left with Happi, SLP for next tx.   Josanna Hefel 04/17/2017, 10:27 AM
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
## 2017-04-17 NOTE — Care Management (Signed)
[facility] Individual Statement of Services  Patient Name:  Peter [name]  Date:  04/17/2017  Welcome to the [facility].  Our goal is to provide you with an individualized program based on your diagnosis and situation, designed to meet your specific needs.  With this comprehensive rehabilitation program, you will be expected to participate in at least 3 hours of rehabilitation therapies Monday-Friday, with modified therapy programming on the weekends.  Your rehabilitation program will include the following services:  Physical Therapy (PT), Occupational Therapy (OT), 24 hour per day rehabilitation nursing, Therapeutic Recreaction (TR), Neuropsychology, Case Management (Social Worker), Rehabilitation Medicine, Nutrition Services and Pharmacy Services  Weekly team conferences will be held on Tuesdays to discuss your progress.  Your Social Worker will talk with you frequently to get your input and to update you on team discussions.  Team conferences with you and your family in attendance may also be held.  Expected length of stay: 3-4 weeks  Overall anticipated outcome: minimal assistance  Depending on your progress and recovery, your program may change. Your Social Worker will coordinate services and will keep you informed of any changes. Your Social Worker's name and contact numbers are listed  below.  The following services may also be recommended but are not provided by the [facility] will be made to provide these services after discharge if needed.  Arrangements include referral to agencies that provide these services.  Your insurance has been verified to be:  [place] Your primary doctor is:  Raelene Bott  Pertinent information will be shared with your doctor and your insurance  company.  Social Worker:  [place], [place] or ([phone]   Information discussed with and copy given to patient by: Lennart Pall, 04/17/2017, 11:32 AM

## 2017-04-18 ENCOUNTER — Inpatient Hospital Stay (HOSPITAL_COMMUNITY): Payer: BLUE CROSS/BLUE SHIELD | Admitting: Occupational Therapy

## 2017-04-18 ENCOUNTER — Inpatient Hospital Stay (HOSPITAL_COMMUNITY): Payer: BLUE CROSS/BLUE SHIELD

## 2017-04-18 ENCOUNTER — Inpatient Hospital Stay (HOSPITAL_COMMUNITY): Payer: BLUE CROSS/BLUE SHIELD | Admitting: Physical Therapy

## 2017-04-18 DIAGNOSIS — E1165 Type 2 diabetes mellitus with hyperglycemia: Secondary | ICD-10-CM

## 2017-04-18 LAB — GLUCOSE, CAPILLARY
GLUCOSE-CAPILLARY: 176 mg/dL — AB (ref 65–99)
Glucose-Capillary: 179 mg/dL — ABNORMAL HIGH (ref 65–99)
Glucose-Capillary: 119 mg/dL — ABNORMAL HIGH (ref 65–99)
Glucose-Capillary: 168 mg/dL — ABNORMAL HIGH (ref 65–99)
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
## 2017-04-18 NOTE — Progress Notes (Signed)
Speech Language Pathology Daily Session Note  Patient Details  Name: Peter [name] MRN: [id number] Date of Birth: 1960-07-26  Today's Date: 04/18/2017 SLP Individual Time: 1305-1400 SLP Individual Time Calculation (min): 55 min  Short Term Goals: Week 1: SLP Short Term Goal 1 (Week 1): Pt will utilize external memory aids to recall new daily information with Min A cues.  SLP Short Term Goal 2 (Week 1): Pt will sustain attention to basic familiar tasks for ~ 30 minutes with Min A cues.  SLP Short Term Goal 3 (Week 1): Pt will demonstrate intellectual awarness by identifying 3 physcial and 3 cognitive deficits with Mod A cues.  SLP Short Term Goal 4 (Week 1): Pt will complete mildly complex problems with Mod A cues.   Skilled Therapeutic Interventions: Skilled ST services focused on cognitive skills. Pt demonstrated ability to recall in detail events leading up to hospitalization. SLP facilitated semi-complex problem solving skills with daily math problems from standardized ALFA, pt required Min-Supervision A verbal cues to complete tasks. Pt participated in semi-complex scheduling tasks, modified for impairment in ability to hold pencil, pt required Min-Mod A verbal/visual cues to complete task, however pt stated that keeping a written schedule is not what he normally does. Pt was left in room with wife present. Continue ST services.     Function:  Eating Eating                 Cognition Comprehension Comprehension assist level: Understands basic 90% of the time/cues < 10% of the time  Expression   Expression assist level: Expresses basic 90% of the time/requires cueing < 10% of the time.  Social Interaction Social Interaction assist level: Interacts appropriately 90% of the time - Needs monitoring or encouragement for participation or interaction.  Problem Solving Problem solving assist level: Solves basic 90% of the time/requires cueing < 10% of the time  Memory Memory assist  level: Recognizes or recalls 50 - 74% of the time/requires cueing 25 - 49% of the time    Pain Pain Assessment Pain Assessment: No/denies pain  Therapy/Group: Individual Therapy  Shailene Demonbreun 04/18/2017, 4:05 PM

## 2017-04-18 NOTE — Progress Notes (Signed)
Occupational Therapy Session Note  Patient Details  Name: Peter [name] MRN: [id number] Date of Birth: Dec 25, 1960  Today's Date: 04/18/2017 OT Individual Time: 1000-1058 OT Individual Time Calculation (min): 58 min    Short Term Goals: Week 1:  OT Short Term Goal 1 (Week 1): Pt will complete self feeding with Mod A and AE PRN OT Short Term Goal 2 (Week 1): Pt will complete LB dressing at sit<stand level with LRAD OT Short Term Goal 3 (Week 1): Pt will BSC transfer with 1 helper and LRAD  Skilled Therapeutic Interventions/Progress Updates:    Upon entering the room, pt supine in bed with family members present. Pt performing supine >sit on EOB with max A for B LE's and trunk. Pt sitting on EOB for 10 minutes with close supervision for balance while therapist assisted pt with washing hair. Pt taking rest break and leaning onto therapist for support. Pt able to sit for another 10 minutes unsupported with close supervision - steady assistance while his wife assisted him with shaving his face. Hand over hand assistance to utilize L UE to comb hair. Pt returning to supine secondary to fatigue with max A. B PRAFO boots donned for positioning in bed. OT demonstrated B finger isolation exercises and coordination tasks with pt returning demonstrations with min verbal cues for proper technique. Pt remained in bed with all rails up and family present. Soft call bell within reach upon exiting the room.   Therapy Documentation Precautions:  Precautions Precautions: Fall Precaution Comments: trach, PEG, catheter  Restrictions Weight Bearing Restrictions: Yes RLE Weight Bearing: Weight bearing as tolerated LLE Weight Bearing: Weight bearing as tolerated Other Position/Activity Restrictions: no ROM restrictions bil LE ADL: ADL ADL Comments: Please see functional navigator for ADL status  See Function Navigator for Current Functional Status.   Therapy/Group: Individual Therapy  Gypsy Decant 04/18/2017, 12:42 PM

## 2017-04-18 NOTE — Progress Notes (Signed)
Physical Therapy Session Note  Patient Details  Name: Peter [name] MRN: [id number] Date of Birth: Nov 01, 1960  Today's Date: 04/18/2017 PT Individual Time: 1420-1508 PT Individual Time Calculation (min): 48 min   Short Term Goals: Week 1:  PT Short Term Goal 1 (Week 1): Pt will transfer from bed<>w/c with mod assist PT Short Term Goal 2 (Week 1): Pt will perform sit<>stand with max assist +1 PT Short Term Goal 3 (Week 1): Pt will initiate gait training PT Short Term Goal 4 (Week 1): Pt will propel w/c x 37f with supervision  Skilled Therapeutic Interventions/Progress Updates:  Pt presented in bed agreeable to therapy. Pt performed supine to sit at EOB with maxA requiring assist for LE placement and truncal support. Pt performed seated stabilization activities and instructed pt in gentle pericapular exercises including shoulder shrugs, scap pinches, seated IR/ER to tolerance. Pt also participated in seated LE therex including, heel rises, pillow squeezes, AALAQ, AAhip flexion to fatigue. Pt able to maintain sitting EOB x 15 min before fatigue. Pt returned to bed maxA and performed additional SAQ, DF, mini bridges, and heel slides x 10. Pt left in bed at end of session with soft touch bell within reach and current needs met.    Therapy Documentation Precautions:  Precautions Precautions: Fall Precaution Comments: trach, PEG, catheter  Restrictions Weight Bearing Restrictions: Yes RLE Weight Bearing: Weight bearing as tolerated LLE Weight Bearing: Weight bearing as tolerated Other Position/Activity Restrictions: no ROM restrictions bil LE General:   Vital Signs: Therapy Vitals Temp: (!) 97.4 F (36.3 C) Temp Source: Oral Pulse Rate: 95 Resp: 18 BP: 130/81 Patient Position (if appropriate): Lying Oxygen Therapy SpO2: 99 % O2 Device: Not Delivered Pain: Pain Assessment Pain Assessment: No/denies pain      See Function Navigator for Current Functional  Status.   Therapy/Group: Individual Therapy  Tyniesha Howald  Chaniece Barbato, PTA  04/18/2017, 3:52 PM

## 2017-04-18 NOTE — IPOC Note (Signed)
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, sequela ([place]) Active Problems:   Pelvic ring fracture, sequela   Acute blood loss anemia   Diabetes mellitus type 2 in nonobese [facility])     Functional Problem List: Nursing Bladder  PT Balance, Behavior, Edema, Endurance, Motor, Nutrition, Pain, Perception, Safety, Sensory, Skin Integrity  OT Balance, Safety, Cognition, Edema, Skin Integrity, Endurance, Motor  SLP Cognition  TR         Basic ADL's: OT Eating, Grooming, Bathing, Dressing, Toileting     Advanced  ADL's: OT       Transfers: PT Bed Mobility, Bed to Chair, Car, Furniture, Futures trader, Metallurgist: PT Ambulation, Emergency planning/management officer, Stairs     Additional Impairments: OT Fuctional Use of Upper Extremity  SLP Social Cognition   Problem Solving, Memory, Attention, Awareness  TR      Anticipated Outcomes Item Anticipated Outcome  Self Feeding Min A  Swallowing      Basic self-care  Min A  Toileting  Min A   Bathroom Transfers Min A  Bowel/Bladder  Manage bladder with mod assist, Manage bowel with Mod assist  Transfers  min assist  Locomotion  mod assist  Communication     Cognition  Supervision  Pain  Less than 2  Safety/Judgment  Will remain free of falls, skin breakdown and infection while on rehab   Therapy Plan:2 PT Intensity: Minimum of 1-2 x/day ,45 to 90 minutes PT Frequency: 5 out of 7 days PT Duration Estimated Length of Stay: 3 weeks OT Intensity: Minimum of 1-2 x/day, 45 to 90 minutes OT Frequency: 5 out of 7 days OT Duration/Estimated Length of Stay: 3-4 weeks SLP Intensity: Minumum of 1-2 x/day, 30 to 90 minutes SLP Frequency: 3 to 5 out  of 7 days SLP Duration/Estimated Length of Stay: 3 weeks    Team Interventions: Nursing Interventions Patient/Family Education, Bladder Management, Bowel Management, Skin Care/Wound Management, Discharge Planning, Psychosocial Support  PT interventions Ambulation/gait training, Discharge planning, Functional mobility training, Therapeutic Activities, Psychosocial support, Balance/vestibular training, Neuromuscular re-education, Skin care/wound management, Therapeutic Exercise, Wheelchair propulsion/positioning, Cognitive remediation/compensation, DME/adaptive equipment instruction, Pain management, Splinting/orthotics, UE/LE Strength taining/ROM, Community reintegration, Barrister's clerk education, IT trainer, UE/LE Coordination activities  OT Interventions Training and development officer, Discharge planning, Pain management, Self Care/advanced ADL retraining, Therapeutic Activities, Functional electrical stimulation, UE/LE Coordination activities, Cognitive remediation/compensation, Disease mangement/prevention, Functional mobility training, Patient/family education, Skin care/wound managment, Therapeutic Exercise, Visual/perceptual remediation/compensation, Academic librarian, Engineer, drilling, Neuromuscular re-education, Psychosocial support, Splinting/orthotics, UE/LE Strength taining/ROM, Wheelchair propulsion/positioning  SLP Interventions Cognitive remediation/compensation, Patient/family education, Medication managment, Internal/external aids, Environmental controls, Cueing hierarchy  TR Interventions    SW/CM Interventions Discharge Planning, Psychosocial Support, Patient/Family Education   Barriers to Discharge MD  Medical stability  Nursing Wound Care    PT Inaccessible home environment    OT  (N/A)    SLP      SW       Team Discharge Planning: Destination: PT-Home ,OT- Home , SLP-Home Projected Follow-up: PT-Home health PT, OT-  Outpatient OT, Home health  OT, SLP-Home Health SLP, 24 hour supervision/assistance, Outpatient SLP Projected Equipment Needs: PT-To be determined, OT- To be determined, SLP-None recommended by SLP  Equipment Details: PT- , OT-  Patient/family involved in discharge planning: PT- Family member/caregiver, Patient,  OT-Patient, Family member/caregiver, SLP-Patient, Family member/caregiver  MD ELOS: 21-24 days Medical Rehab Prognosis:  Excellent Assessment: The patient has been admitted for CIR therapies with the diagnosis of TBI/polytrauma with respiratory failure and subsequent debility. The team will be addressing functional mobility, strength, stamina, balance, safety, adaptive techniques and equipment, self-care, bowel and bladder mgt, patient and caregiver education, NMR, pain mgt, ortho precautions, cogntion, communication. Goals have been set at min assist for basic self-care, min to mod assist with transfers and mobility and min assist to supervision with cognition.    [name], MD, FAAPMR      See Team Conference Notes for weekly updates to the plan of care

## 2017-04-18 NOTE — Progress Notes (Signed)
Physical Therapy Session Note  Patient Details  Name: Peter [name] MRN: [id number] Date of Birth: Nov 19, 1960  Today's Date: 04/18/2017 PT Individual Time: 1537-9432 PT Individual Time Calculation (min): 40 min   Short Term Goals: Week 1:  PT Short Term Goal 1 (Week 1): Pt will transfer from bed<>w/c with mod assist PT Short Term Goal 2 (Week 1): Pt will perform sit<>stand with max assist +1 PT Short Term Goal 3 (Week 1): Pt will initiate gait training PT Short Term Goal 4 (Week 1): Pt will propel w/c x 50f with supervision  Skilled Therapeutic Interventions/Progress Updates:   Pt supine upon arrival and agreeable to therapy, c/o LLE soreness but tolerable. Worked on functional mobility and tolerance to sitting u pin chair this session. Transferred to EOB and to w/c w/ Max A and increased time for transfer. Transfer was slide board transfer, however pt required Max A for sliding on slide board, even while going downhill to w/c. Verbal and manual cues for technique. Pt self-propelled w/c 50' x2 using BUEs and Min A. Returned to room and educated pt on importance of spending time sitting upright outside of therapy to increased OOB tolerance and provide postural support. Transferred to recliner Max A via squat pivot and ended session in recliner, call bell within reach and all needs met.   Therapy Documentation Precautions:  Precautions Precautions: Fall Precaution Comments: trach, PEG, catheter  Restrictions Weight Bearing Restrictions: Yes RLE Weight Bearing: Weight bearing as tolerated LLE Weight Bearing: Weight bearing as tolerated Other Position/Activity Restrictions: no ROM restrictions bil LE Vital Signs: Therapy Vitals Temp: (!) 97.4 F (36.3 C) Temp Source: Oral Pulse Rate: 95 Resp: 18 BP: 130/81 Patient Position (if appropriate): Lying Oxygen Therapy SpO2: 99 % O2 Device: Not Delivered Pain: Pain Assessment Pain Assessment: No/denies pain  See Function  Navigator for Current Functional Status.   Therapy/Group: Individual Therapy  Carrianne Hyun K Arnette 04/18/2017, 5:00 PM

## 2017-04-18 NOTE — Progress Notes (Signed)
New skin breakdown within one day of arrival on unit related to catheter. Fragile skin, non-healing wounds from minor scrape in therapy. MD aware. WOC consult placed.

## 2017-04-19 ENCOUNTER — Inpatient Hospital Stay (HOSPITAL_COMMUNITY): Payer: BLUE CROSS/BLUE SHIELD | Admitting: Speech Pathology

## 2017-04-19 ENCOUNTER — Inpatient Hospital Stay (HOSPITAL_COMMUNITY): Payer: BLUE CROSS/BLUE SHIELD | Admitting: Physical Therapy

## 2017-04-19 ENCOUNTER — Inpatient Hospital Stay (HOSPITAL_COMMUNITY): Payer: BLUE CROSS/BLUE SHIELD | Admitting: Occupational Therapy

## 2017-04-19 ENCOUNTER — Inpatient Hospital Stay (HOSPITAL_COMMUNITY): Payer: BLUE CROSS/BLUE SHIELD

## 2017-04-19 DIAGNOSIS — S06303S Unspecified focal traumatic brain injury with loss of consciousness of 1 hour to 5 hours 59 minutes, sequela: Secondary | ICD-10-CM

## 2017-04-19 LAB — GLUCOSE, CAPILLARY
Glucose-Capillary: 170 mg/dL — ABNORMAL HIGH (ref 65–99)
Glucose-Capillary: 210 mg/dL — ABNORMAL HIGH (ref 65–99)
Glucose-Capillary: 70 mg/dL (ref 65–99)
Glucose-Capillary: 161 mg/dL — ABNORMAL HIGH (ref 65–99)
Glucose-Capillary: 188 mg/dL — ABNORMAL HIGH (ref 65–99)

## 2017-04-19 MED ORDER — GLIMEPIRIDE 2 MG PO TABS
2.0000 mg | ORAL_TABLET | Freq: Every day | ORAL | Status: DC
  Administered 2017-04-20 – 2017-04-26 (×7): 2 mg via ORAL
  Filled 2017-04-19 (×7): qty 1

## 2017-04-19 NOTE — Progress Notes (Signed)
1140-trach incision cleaned with normal saline, 2x2 dressing applied with foam dressing overlay-Candace Abbey Chatters- Student Nurse

## 2017-04-19 NOTE — Progress Notes (Signed)
Occupational Therapy Note  Patient Details  Name: Lawson Mahone MRN: [id number] Date of Birth: 1961-04-14  Today's Date: 04/19/2017 OT Individual Time: 1330-1430 OT Individual Time Calculation (min): 60 min   Pt denies pain Individual Therapy  Pt resting in bed upon arrival.  Initial focus on bed mobility requiring mod A/max A for rolling and repositioning.  Educated pt on importance of changing position in bed.  Focus on BUE PROM/AAROM and AROM. Edema noted on L hand and kinesio tape applied for edema management.  Pt unable to actively extend fingers on B hands and recommended resting hand splints ordered by PA after consultation with OTR.     [name] [facility] 04/19/2017, 3:16 PM

## 2017-04-19 NOTE — Progress Notes (Signed)
Speech Language Pathology Daily Session Note  Patient Details  Name: Peter [name] MRN: [id number] Date of Birth: 02/09/1961  Today's Date: 04/19/2017 SLP Individual Time: 1430-1500 SLP Individual Time Calculation (min): 30 min  Short Term Goals: Week 1: SLP Short Term Goal 1 (Week 1): Pt will utilize external memory aids to recall new daily information with Min A cues.  SLP Short Term Goal 2 (Week 1): Pt will sustain attention to basic familiar tasks for ~ 30 minutes with Min A cues.  SLP Short Term Goal 3 (Week 1): Pt will demonstrate intellectual awarness by identifying 3 physcial and 3 cognitive deficits with Mod A cues.  SLP Short Term Goal 4 (Week 1): Pt will complete mildly complex problems with Mod A cues.   Skilled Therapeutic Interventions: Skilled treatment session focused on cognition goals. SLP facilitated session by providing Min A cues to recall information from previous session as well and POC within different therapy sessions as well as purpose of tapping. Pt able to sustain attention to task for ~ 30 minutes with supervision cues. Pt was left upright in bed with all needs within reach. Continue per current plan of care.      Function:    Cognition Comprehension Comprehension assist level: Understands basic 90% of the time/cues < 10% of the time  Expression   Expression assist level: Expresses basic 90% of the time/requires cueing < 10% of the time.  Social Interaction Social Interaction assist level: Interacts appropriately 90% of the time - Needs monitoring or encouragement for participation or interaction.  Problem Solving Problem solving assist level: Solves basic 90% of the time/requires cueing < 10% of the time  Memory Memory assist level: Recognizes or recalls 50 - 74% of the time/requires cueing 25 - 49% of the time    Pain Critical Care Pain Observation Tool (CPOT) Facial Expression: Relaxed, neutral Body Movements: Absence of movements Muscle Tension:  Relaxed  Therapy/Group: Individual Therapy  [name] 04/19/2017, 3:32 PM

## 2017-04-19 NOTE — Progress Notes (Signed)
Orthopedic Tech Progress Note Patient Details:  Peter [name] 1961/04/07 [id number]  Patient ID: [name], male   DOB: 1960-07-23, 56 y.o.   MRN: [id number]   Hildred Priest 04/19/2017, 2:45 PM Called in hanger brace order; spoke with [facility]

## 2017-04-19 NOTE — Progress Notes (Signed)
Occupational Therapy Session Note  Patient Details  Name: Peter [name] MRN: [id number] Date of Birth: 1960/09/24  Today's Date: 04/19/2017 OT Individual Time: 1924-3836 OT Individual Time Calculation (min): 45 min    Short Term Goals: Week 1:  OT Short Term Goal 1 (Week 1): Pt will complete self feeding with Mod A and AE PRN OT Short Term Goal 2 (Week 1): Pt will complete LB dressing at sit<stand level with LRAD OT Short Term Goal 3 (Week 1): Pt will BSC transfer with 1 helper and LRAD  Skilled Therapeutic Interventions/Progress Updates:    Pt seen this session to facilitate ROM and strength with bed mobility and self care. Pt worked on a/arom of knees with rolling in bed for LB self care. Min A to roll, total with LB self care.  Sat to EOB with mod A and maintained static sit balance with S for 15 min.  Steadying A with moving arms for UB self care with one LOB to R.  Pt has severely limited PROM in shoulders, elbows, wrists due to stiffness and edema.  Hand over hand guiding with wash cloth in hands to wash UB with mod A and max A to don shirt as pt did assist by pulling sleeves up.  Limited tip pinch/ prehension. Pt is using foam handles on silver ware.  Pt adjusted back into bed with all needs met.    Therapy Documentation Precautions:  Precautions Precautions: Fall Precaution Comments: trach, PEG, catheter  Restrictions Weight Bearing Restrictions: Yes RLE Weight Bearing: Weight bearing as tolerated LLE Weight Bearing: Weight bearing as tolerated Other Position/Activity Restrictions: no ROM restrictions bil LE   Pain:   ADL: ADL ADL Comments: Please see functional navigator for ADL status  See Function Navigator for Current Functional Status.   Therapy/Group: Individual Therapy  Roxie 04/19/2017, 10:08 AM

## 2017-04-19 NOTE — Patient Care Conference (Signed)
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 Chest trauma   . Fracture   . Multiple rib fractures involving four or more ribs   . Pelvic ring fracture, sequela   . Respiratory failure ([place])   . 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[place])   . Post-operative pain   . Acute blood loss anemia   . Diabetes mellitus type 2 in nonobese ([facility])   . Hyperlipidemia   . Tachypnea   . Secondary hypertension   . Hyperkalemia   . Pressure injury of skin 02/28/2017  . Multiple closed anterior-posterior compression fractures of pelvis ([place]) 02/08/2017  . Multiple fractures of ribs, bilateral, initial encounter for closed fracture 02/07/2017    Expected Discharge Date: Expected Discharge Date:  (3 weeks)  Team Members Present: Physician leading conference: Dr. Alger Simons Social Worker Present: Lennart Pall, LCSW Nurse Present: Benjie Karvonen, RN PT Present: Canary Brim, PT OT Present: Benay Pillow, OT SLP Present: Windell Moulding, SLP PPS Coordinator present : Daiva Nakayama, RN, CRRN     Current Status/Progress Goal Weekly Team Focus  Medical   admitted for TBI/ debility. trach out. nutritional issues  improve activity tolerance   skin care, nutrition,  bp control   Bowel/Bladder   bowel incontinence, LBM 04/18/17, S/p catheter  less episodes of bowel incontinence  monitor for pattern & training   Swallow/Nutrition/ Hydration             ADL's   min A static sitting balance, UB mod A, LB total A, functional transfers +2 with sara  min A overall  pt/family education, trunk control, self care retraining,endurance, strength   Mobility   +2 overall; min static sitting  min assist w/c level; supervision w/c mobility; mod assist controlled environment gait goal  transfers, balance, postural control, w/c mobility, standing as able   Communication             Safety/Cognition/ Behavioral Observations  Min A  supervision  Semi-complex problem solving, recall, emergent awareness and sustained attention    Pain   no c/o pain/ has oxycodone prn, not using  pain scale <4  continue to assess & treat as needed   Skin   multiple wounds to the left thigh, inferior penis wound, left heel DTI  no new areas of skin breakdown, no signs of infection  continue to assess q shift    Rehab Goals Patient on target to meet rehab goals: Yes *See Care Plan and progress notes for long and short-term goals.     Barriers to Discharge  Current Status/Progress Possible Resolutions Date Resolved   Physician  Medical stability        ongoing medical mgt      Nursing  Wound Care               PT  Inaccessible home environment                 OT  (N/A)                SLP                SW                Discharge Planning/Teaching Needs:  Plan for d/c home with wife and family to provide 24/7 assistance.  Teaching to take place closer to d/c.   Team Discussion:  Very impaired and multiple medical issues;  Weak; encephalopathic.  Trach out; RN asking about plans for supra pubic cath - MD to follow up.  Multiple skin concerns.  Goals begin set for min assist overall and mod assist gait.    Revisions to Treatment Plan:  None     Continued Need for Acute Rehabilitation Level of Care: The patient requires daily medical management by a physician with specialized training in physical medicine and rehabilitation for the following conditions: Daily direction of a multidisciplinary physical rehabilitation program to ensure safe treatment while eliciting the highest outcome that is of practical value to the patient.: Yes Daily medical management of patient stability for increased activity during participation in an intensive rehabilitation regime.: Yes Daily analysis of laboratory values and/or radiology reports with any subsequent need for medication adjustment of medical intervention for : Post surgical problems;Neurological problems  Waynesha Rammel, [place] 04/19/2017, 1:49 PM

## 2017-04-19 NOTE — Progress Notes (Signed)
[place] PHYSICAL MEDICINE & REHABILITATION     PROGRESS NOTE    Subjective/Complaints: No new complaints. Just waking up. Denies pain this morning  ROS: pt denies nausea, vomiting, diarrhea, cough, shortness of breath or chest pain   Objective: Vital Signs: Blood pressure 139/82, pulse 100, temperature 97.9 F (36.6 C), temperature source Oral, resp. rate 16, height 5\' 10"  (1.778 m), weight 68 kg (149 lb 14.6 oz), SpO2 99 %. No results found. No results for input(s): WBC, HGB, HCT, PLT in the last 72 hours. No results for input(s): NA, K, CL, GLUCOSE, BUN, CREATININE, CALCIUM in the last 72 hours.  Invalid input(s): CO CBG (last 3)   Recent Labs  04/18/17 1656 04/18/17 2042 04/19/17 0634  GLUCAP 119* 168* 170*    Wt Readings from Last 3 Encounters:  04/19/17 68 kg (149 lb 14.6 oz)  03/23/17 74.4 kg (164 lb 0.4 oz)    Physical Exam:  Constitutional: frail appearing.  HENT:  Head: Normocephalicand atraumatic.  Eyes: Pupils are equal, round, and reactive to light. EOMare normal.  Neck: Normal range of motion. Neck supple. No thyromegalypresent.  Trach stoma closed,granulating in. Still ?opening/air leak Cardiovascular: RRR Respiratory: normal effort GI: Soft. Bowel sounds are normal. He exhibits no distension. There is no tenderness. There is no rebound.  Gastrostomy tube in place  Skin. Warm and dry, thin Musculoskeletal. Patient with +1 edema to the right hand. Palpable pedal pulses lower extremities Genitourinary. Suprapubic tube in place Neurological. Awake and alert. Limited insight and awareness. Follows simple commands.  Oriented to place, person, month, reason he's here. UE 3+/5 Deltoid, biceps, triceps and 4- HI. LE: 2 to 2+/5 HF, KEand 3+ ADF/PF. No sensory deficits. No substantial changes in motor exam Psych: cooperative and appropriate. Flat.   Assessment/Plan: 1. Functional and cognitive deficits secondary to TBI/polytrauma/debility which  require 3+ hours per day of interdisciplinary therapy in a comprehensive inpatient rehab setting. Physiatrist is providing close team supervision and 24 hour management of active medical problems listed below. Physiatrist and rehab team continue to assess barriers to discharge/monitor patient progress toward functional and medical goals.  Function:  Bathing Bathing position   Position:  (UB EOB, LB sit<stand with Sara+)  Bathing parts Body parts bathed by patient: Chest, Abdomen Body parts bathed by helper: Right arm, Left arm, Buttocks, Right upper leg, Left upper leg, Right lower leg, Left lower leg, Back  Bathing assist Assist Level: 2 helpers      Upper Body Dressing/Undressing Upper body dressing   What is the patient wearing?: Pull over shirt/dress     Pull over shirt/dress - Perfomed by patient: Thread/unthread left sleeve Pull over shirt/dress - Perfomed by helper: Thread/unthread right sleeve, Thread/unthread left sleeve, Put head through opening, Pull shirt over trunk        Upper body assist Assist Level:  (total assist)      Lower Body Dressing/Undressing Lower body dressing   What is the patient wearing?: Pants, Non-skid slipper socks       Pants- Performed by helper: Thread/unthread right pants leg, Thread/unthread left pants leg, Pull pants up/down Non-skid slipper socks- Performed by patient: Don/doff right sock, Don/doff left sock                    Lower body assist Assist for lower body dressing:  (Total A)      Toileting Toileting   Toileting steps completed by patient: Adjust clothing prior to toileting, Adjust clothing after toileting Toileting  steps completed by helper: Adjust clothing prior to toileting, Performs perineal hygiene, Adjust clothing after toileting    Toileting assist Assist level: Two helpers   Transfers Chair/bed transfer   Chair/bed transfer method: Squat pivot Chair/bed transfer assist level: Maximal assist (Pt 25 -  49%/lift and lower) Chair/bed transfer assistive device: Armrests, Sliding board Mechanical lift: Primary school teacher Ambulation activity did not occur: Safety/medical concerns         Wheelchair   Type: Manual Max wheelchair distance: 6' Assist Level: Touching or steadying assistance (Pt > 75%)  Cognition Comprehension Comprehension assist level: Understands basic 90% of the time/cues < 10% of the time  Expression Expression assist level: Expresses basic 90% of the time/requires cueing < 10% of the time.  Social Interaction Social Interaction assist level: Interacts appropriately 90% of the time - Needs monitoring or encouragement for participation or interaction.  Problem Solving Problem solving assist level: Solves basic 90% of the time/requires cueing < 10% of the time  Memory Memory assist level: Recognizes or recalls 50 - 74% of the time/requires cueing 25 - 49% of the time   Medical Problem List and Plan: 1. Decreased functional mobility with cognitive deficitssecondary to TBI/polytrauma 02/07/2017, additional deconditioning due to prolonged hospital stay -team conference. Continue to work on strength and activity tolerance 2. DVT Prophylaxis/Anticoagulation: Subcutaneous Lovenox. Check vascular study pending 3. Pain Management: Oxycodone 5 mg every 6 hours as needed -fair control 4. Mood: Clonazepam 0.5 mg daily at bedtime, trazodone 50 mg daily at bedtime, Seroquel 100 mg daily at bedtime, Zoloft 50 mg daily, amantadine 100 mg daily, Provigil 100 mg daily 5. Neuropsych: This patient iscapable of making decisions on hisown behalf. 6. Skin/Wound Care: Routine skin checks  -  air mattress due to sensitive skin  -WOC RN consulted for any recs 7. Fluids/Electrolytes/Nutrition: Routine I&O with follow-up chemistries 8.Nondisplaced fractures of right T7 transverse process. Conservative care 9.Multiple pelvic fractures/bilateral sacroiliac  joints and suprapubic symphysis as well as left pelvic sidewall hematoma. Status post sacroiliac screw fixation with external fixation pelvis anterior pelvic ring closed reduction 02/07/2017. May ambulate extended distances weightbearing as tolerated without restrictions. 10.Seizure prophylaxis. Keppra 500 mg every 12 hours. EEG negative 11.Tracheostomy tube 03/01/2017 per Dr. Hulen Skains. Decannulated. Continue occlusive dressing--wound closing  -continue packing 12.Gastrostomy tube 03/01/2017 per Dr. Hulen Skains. Presently not receiving tube feeds. Diet advanced to mechanical soft and liquids. 13.Urinary retention/urethral injury. Status post suprapubic catheter placement 03/21/2017 per interventional radiology. Follow-up urology services Dr. Louis Meckel  -will check on duration while here 14.Acute blood loss anemia. Follow-up CBC. Continue iron supplement 15. DM 2: SSI, CBG's TID and HS  -on CM diet  -added amaryl 10/29---with improvement. Increase to 2mg    LOS (Days) 4 A FACE TO FACE EVALUATION WAS PERFORMED  [name], MD 04/19/2017 8:04 AM

## 2017-04-19 NOTE — Progress Notes (Signed)
Social Work Patient ID: Peter [name], male   DOB: 03/02/1961, 56 y.o.   MRN: [id number]   Met with pt and wife yesterday to review team conference.  They are aware that team has ELOS of 3 weeks and min assist goals overall.  They are agreeable and report no concerns at this time.  Will continue to follow.  , , LCSW  

## 2017-04-19 NOTE — Consult Note (Signed)
[facility] Nurse wound consult note Reason for Consult: new area right groin, suspect from SP tube Wound type: [facility] hospital acquired device related pressure injury Per chart review, bedside nurse taped SP down to avoid kinks in the night.  Tube has rubbed the skin in several places, they are not linear so most likely to friction of the device, more than pressure. Pressure Injury POA: No Measurement: 4 areas each between 2cm -4cm x 2cm x 0.1cm  Wound bed: clean, pink, moist Drainage (amount, consistency, odor) scant, non purulent, serosanguinous  Periwound: intact, known to have fragile, thin skin  Dressing procedure/placement/frequency: Continue protection of the vunerable skin with soft silicone foam. Monitor for changes  Discussed penile wound with bedside nurse, I was not able to assess this during his therapy session today. She will assess and if open use xeroform to the site.  I did turn the patient and his back and buttocks are intact.   Discussed POC with patient and bedside nurse.  Re consult if needed, will not follow at this time. Thanks  Zakkiyya Barno [employer], RN,CWOCN, CNS, [place] [phone])

## 2017-04-19 NOTE — Progress Notes (Signed)
Physical Therapy Session Note  Patient Details  Name: Peter [name] MRN: [id number] Date of Birth: March 03, 1961  Today's Date: 04/19/2017 PT Individual Time: 1030-1115 PT Individual Time Calculation (min): 45 min   Short Term Goals: Week 1:  PT Short Term Goal 1 (Week 1): Pt will transfer from bed<>w/c with mod assist PT Short Term Goal 2 (Week 1): Pt will perform sit<>stand with max assist +1 PT Short Term Goal 3 (Week 1): Pt will initiate gait training PT Short Term Goal 4 (Week 1): Pt will propel w/c x 53ft with supervision  Skilled Therapeutic Interventions/Progress Updates: Pt received supine in bed, denies pain and agreeable to treatment. Supine>sit with HOB elevated and bedrails. Transfer bed>w/c with transfer board and max/totalA d/t sliding board moving during transfer. Transfer w/c <>mat table modA with one large scoot to assist pt onto board, then min/mod for remaining scoots. Side lying propped on elbow<>sitting for core strengthening, weight bearing through UEs. Semi-reclined situps x15 reps. Sit <>supine maxA on mat table after pt reporting it feels like he is sitting on something metal. Therapist inspected skin, clothing and did not find anything obstructive. Discussed with pt that as he has lost weight and muscle atrophied his skin and bony prominences may be more sensitive, educated on frequent pressure shifts. Returned to w/c as above; remained seated in w/c at end of session, all needs in reach.      Therapy Documentation Precautions:  Precautions Precautions: Fall Precaution Comments: trach, PEG, catheter  Restrictions Weight Bearing Restrictions: Yes RLE Weight Bearing: Weight bearing as tolerated LLE Weight Bearing: Weight bearing as tolerated Other Position/Activity Restrictions: no ROM restrictions bil LE   See Function Navigator for Current Functional Status.   Therapy/Group: Individual Therapy  Luberta Mutter 04/19/2017, 11:16 AM

## 2017-04-19 NOTE — Progress Notes (Signed)
Occupational Therapy Session Note  Patient Details  Name: Peter [name] MRN: [id number] Date of Birth: 04-07-1961  Today's Date: 04/19/2017 OT Individual Time: 1130-1200 OT Individual Time Calculation (min): 30 min    Short Term Goals: Week 1:  OT Short Term Goal 1 (Week 1): Pt will complete self feeding with Mod A and AE PRN OT Short Term Goal 2 (Week 1): Pt will complete LB dressing at sit<stand level with LRAD OT Short Term Goal 3 (Week 1): Pt will BSC transfer with 1 helper and LRAD  Skilled Therapeutic Interventions/Progress Updates:    1:1 Focus on transfer training back to bed via slide board with mod A downhill into the bed. Focus on trunk extension and stretching in sitting position. Also focus on shoulder stability exercises. Returned to supine with max A for A for bilateral Les.   Therapy Documentation Precautions:  Precautions Precautions: Fall Precaution Comments: trach, PEG, catheter  Restrictions Weight Bearing Restrictions: Yes RLE Weight Bearing: Weight bearing as tolerated LLE Weight Bearing: Weight bearing as tolerated Other Position/Activity Restrictions: no ROM restrictions bil LE Pain: No c/o pain ADL: ADL ADL Comments: Please see functional navigator for ADL status  See Function Navigator for Current Functional Status.   Therapy/Group: Individual Therapy  Willeen Cass [facility] 04/19/2017, 3:10 PM

## 2017-04-20 ENCOUNTER — Inpatient Hospital Stay (HOSPITAL_COMMUNITY): Payer: BLUE CROSS/BLUE SHIELD | Admitting: Occupational Therapy

## 2017-04-20 ENCOUNTER — Inpatient Hospital Stay (HOSPITAL_COMMUNITY): Payer: BLUE CROSS/BLUE SHIELD | Admitting: Physical Therapy

## 2017-04-20 ENCOUNTER — Inpatient Hospital Stay (HOSPITAL_COMMUNITY): Payer: BLUE CROSS/BLUE SHIELD | Admitting: Speech Pathology

## 2017-04-20 LAB — GLUCOSE, CAPILLARY
GLUCOSE-CAPILLARY: 202 mg/dL — AB (ref 65–99)
GLUCOSE-CAPILLARY: 163 mg/dL — AB (ref 65–99)
GLUCOSE-CAPILLARY: 183 mg/dL — AB (ref 65–99)
Glucose-Capillary: 174 mg/dL — ABNORMAL HIGH (ref 65–99)

## 2017-04-20 MED ORDER — TRAZODONE HCL 50 MG PO TABS
25.0000 mg | ORAL_TABLET | Freq: Every day | ORAL | Status: DC
  Administered 2017-04-20 – 2017-04-23 (×4): 25 mg via ORAL
  Filled 2017-04-20 (×4): qty 1

## 2017-04-20 NOTE — Progress Notes (Signed)
Orthopedic Tech Progress Note Patient Details:  Peter [name] 02-27-1961 [id number] Brace completed by [employer]. Patient ID: [name], male   DOB: 01/10/1961, 56 y.o.   MRN: [id number]   Braulio Bosch 04/20/2017, 5:14 PM

## 2017-04-20 NOTE — Progress Notes (Signed)
All foam dressings changed as well as Xeroform around postier side of penis at foreskin fold. Teds placed after heel foam bilaterally changed. Cath care for suprapubic and dressing changed.Lurline Idol dressing changed after cleaning. Medicated with analgesic prior to dressing changes.

## 2017-04-20 NOTE — Progress Notes (Signed)
[facility] PHYSICAL MEDICINE & REHABILITATION     PROGRESS NOTE    Subjective/Complaints: Slept well. Just awakening when I entered  ROS: pt denies nausea, vomiting, diarrhea, cough, shortness of breath or chest pain   Objective: Vital Signs: Blood pressure 123/79, pulse (!) 101, temperature 97.6 F (36.4 C), temperature source Oral, resp. rate 17, height 5\' 10"  (1.778 m), weight 68 kg (149 lb 14.6 oz), SpO2 98 %. No results found. No results for input(s): WBC, HGB, HCT, PLT in the last 72 hours. No results for input(s): NA, K, CL, GLUCOSE, BUN, CREATININE, CALCIUM in the last 72 hours.  Invalid input(s): CO CBG (last 3)   Recent Labs  04/19/17 1758 04/19/17 2106 04/20/17 0634  GLUCAP 161* 188* 174*    Wt Readings from Last 3 Encounters:  04/19/17 68 kg (149 lb 14.6 oz)  03/23/17 74.4 kg (164 lb 0.4 oz)    Physical Exam:  Constitutional: frail appearing.  HENT:  Head: Normocephalicand atraumatic.  Eyes: Pupils are equal, round, and reactive to light. EOMare normal.  Neck: Normal range of motion. Neck supple. No thyromegalypresent.  Trach stoma closed,granulating in. Still ?opening/air leak Cardiovascular: RRR Respiratory: normal effort GI: Soft. Bowel sounds are normal. He exhibits no distension. There is no tenderness. There is no rebound.  Gastrostomy tube in place  Skin. Warm and dry, thin Musculoskeletal. Patient with +1 edema to the right hand. Palpable pedal pulses lower extremities Genitourinary. Suprapubic tube in place Neurological. Awake and alert. Limited insight and awareness. Follows simple commands.  Oriented to place, person, month, reason he's here. UE 3+/5 Deltoid, biceps, triceps and 4- HI. LE: 2 to 2+/5 HF, KEand 3+ ADF/PF. No sensory deficits. No substantial changes in motor exam Psych: cooperative and appropriate. Flat.   Assessment/Plan: 1. 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 Physiatrist is providing close team supervision and 24 hour management of active medical problems listed below. Physiatrist and rehab team continue to assess barriers to discharge/monitor patient progress toward functional and medical goals.  Function:  Bathing Bathing position   Position: Bed  Bathing parts Body parts bathed by patient: Chest, Abdomen Body parts bathed by helper: Right arm, Left arm, Buttocks, Right upper leg, Left upper leg, Right lower leg, Left lower leg, Back  Bathing assist Assist Level: 2 helpers      Upper Body Dressing/Undressing Upper body dressing   What is the patient wearing?: Pull over shirt/dress     Pull over shirt/dress - Perfomed by patient: Thread/unthread left sleeve Pull over shirt/dress - Perfomed by helper: Thread/unthread right sleeve, Thread/unthread left sleeve, Put head through opening, Pull shirt over trunk        Upper body assist Assist Level:  (total assist)      Lower Body Dressing/Undressing Lower body dressing   What is the patient wearing?: Pants, Non-skid slipper socks       Pants- Performed by helper: Thread/unthread right pants leg, Thread/unthread left pants leg, Pull pants up/down Non-skid slipper socks- Performed by patient: Don/doff right sock, Don/doff left sock                    Lower body assist Assist for lower body dressing:  (Total A)      Toileting Toileting   Toileting steps completed by patient: Adjust clothing prior to toileting, Adjust clothing after toileting Toileting steps completed by helper: Adjust clothing prior to  toileting, Performs perineal hygiene, Adjust clothing after toileting    Toileting assist Assist level: Two helpers   Transfers Chair/bed transfer   Chair/bed transfer method: Lateral scoot Chair/bed transfer assist level: Moderate assist (Pt 50 - 74%/lift or lower) Chair/bed transfer assistive  device: Armrests, Sliding board Mechanical lift: Teacher, music activity did not occur: Safety/medical concerns         Wheelchair   Type: Manual Max wheelchair distance: 50 Assist Level: Touching or steadying assistance (Pt > 75%)  Cognition Comprehension Comprehension assist level: Understands basic 90% of the time/cues < 10% of the time  Expression Expression assist level: Expresses basic 90% of the time/requires cueing < 10% of the time.  Social Interaction Social Interaction assist level: Interacts appropriately 90% of the time - Needs monitoring or encouragement for participation or interaction.  Problem Solving Problem solving assist level: Solves basic 90% of the time/requires cueing < 10% of the time  Memory Memory assist level: Recognizes or recalls 50 - 74% of the time/requires cueing 25 - 49% of the time   Medical Problem List and Plan: 1. Decreased functional mobility with cognitive deficitssecondary to TBI/polytrauma 02/07/2017, additional deconditioning due to prolonged hospital stay -continue PT, OT SLP 2. DVT Prophylaxis/Anticoagulation: Subcutaneous Lovenox. Check vascular study pending 3. Pain Management: Oxycodone 5 mg every 6 hours as needed -fair control 4. Mood: Clonazepam 0.5 mg daily at bedtime, trazodone 50 mg daily at bedtime, Seroquel 100 mg daily at bedtime, Zoloft 50 mg daily, amantadine 100 mg daily, Provigil 100 mg daily  -may be able to wean some of his sedatives to help day time arousal 5. Neuropsych: This patient iscapable of making decisions on hisown behalf. 6. Skin/Wound Care: Routine skin checks  -  air mattress due to sensitive skin  -WOC RN consulted for any recs 7. Fluids/Electrolytes/Nutrition: Routine I&O with follow-up chemistries 8.Nondisplaced fractures of right T7 transverse process. Conservative care 9.Multiple pelvic fractures/bilateral sacroiliac joints and suprapubic symphysis  as well as left pelvic sidewall hematoma. Status post sacroiliac screw fixation with external fixation pelvis anterior pelvic ring closed reduction 02/07/2017. May ambulate extended distances weightbearing as tolerated without restrictions. 10.Seizure prophylaxis. Keppra 500 mg every 12 hours. EEG negative 11.Tracheostomy tube 03/01/2017 per Dr. Hulen Skains. Decannulated. Continue occlusive dressing 12.Gastrostomy tube 03/01/2017 per Dr. Hulen Skains. Presently not receiving tube feeds. Diet advanced to mechanical soft and liquids. 13.Urinary retention/urethral injury. Status post suprapubic catheter placement 03/21/2017 per interventional radiology. Follow-up urology services Dr. Louis Meckel  -will check on duration while here 14.Acute blood loss anemia. Follow-up CBC. Continue iron supplement 15. DM 2: SSI, CBG's TID and HS  -on CM diet  -added amaryl 10/29---increased to 2mg  10/31   LOS (Days) 5 A FACE TO FACE EVALUATION WAS PERFORMED  [name], MD 04/20/2017 8:42 AM

## 2017-04-20 NOTE — Progress Notes (Signed)
Physical Therapy Session Note  Patient Details  Name: Peter [name] MRN: [id number] Date of Birth: 02-04-1961  Today's Date: 04/20/2017 PT Individual Time: 0900-1000 PT Individual Time Calculation (min): 60 min   Short Term Goals: Week 1:  PT Short Term Goal 1 (Week 1): Pt will transfer from bed<>w/c with mod assist PT Short Term Goal 2 (Week 1): Pt will perform sit<>stand with max assist +1 PT Short Term Goal 3 (Week 1): Pt will initiate gait training PT Short Term Goal 4 (Week 1): Pt will propel w/c x 51ft with supervision  Skilled Therapeutic Interventions/Progress Updates: Pt received seated in bed, denies pain and agreeable to treatment. Pants donned totalA, rolling R/L with minA to pull pants over hips. Transfer bed<>tilt table with lateral scoot totalA, pt assisting with LE positioning. Supine BP 118/82. Pt elevated to 50 degrees; assist required for placement of LEs to reduce external rotation, and pt reports significant stretch in gastroc. BP dropped to 75/69 HR 114. Returned to supine for rest break and BP normalized. Elevated bed to 40 degrees, BP dropped to 85/69, returned to supine and BP normalized again 2-3 min. Pt asymptomatic throughout when BP drops. One additional trial at 40 degrees with pt performing heel raises and mini squats with AAROM; BP dropped and pt returned to supine. Discussed alternative methods for maintaining BP to trial next session including TEDs, ace wraps, abdominal binder, etc. Returned to supine in bed as above.  Encouraged pt to continue practicing with using bed controls and remote for hand intrinsic strengthening; pt able to perform with increased time and effort. Remained supine in bed, all needs in reach at end of session.      Therapy Documentation Precautions:  Precautions Precautions: Fall Precaution Comments: trach, PEG, catheter  Restrictions Weight Bearing Restrictions: Yes RLE Weight Bearing: Weight bearing as tolerated LLE Weight  Bearing: Weight bearing as tolerated Other Position/Activity Restrictions: no ROM restrictions bil LE Pain: Pain Assessment Pain Assessment: 0-10 Pain Score: 0-No pain Faces Pain Scale: No hurt   See Function Navigator for Current Functional Status.   Therapy/Group: Individual Therapy  Luberta Mutter 04/20/2017, 10:25 AM

## 2017-04-20 NOTE — Progress Notes (Signed)
Pt has elevated HR that was irregular. Beta-blocker was given as ordered but the HR elevation persisted after an hour of metoprolol  Administration. On call NP has been notified. She recommended that the charge nurse checks the HR. The charge nurse went in and manually checked the HR and reported that she counted 105. Pt at this time denies any palpitation, or any discomfort. Will continue to monitor.

## 2017-04-20 NOTE — Progress Notes (Signed)
Speech Language Pathology Daily Session Note  Patient Details  Name: Peter [name] MRN: [id number] Date of Birth: [birth year]  Today's Date: 04/20/2017 SLP Individual Time: 1030-1130 SLP Individual Time Calculation (min): 60 min  Short Term Goals: Week 1: SLP Short Term Goal 1 (Week 1): Pt will utilize external memory aids to recall new daily information with Min A cues.  SLP Short Term Goal 2 (Week 1): Pt will sustain attention to basic familiar tasks for ~ 30 minutes with Min A cues.  SLP Short Term Goal 3 (Week 1): Pt will demonstrate intellectual awarness by identifying 3 physcial and 3 cognitive deficits with Mod A cues.  SLP Short Term Goal 4 (Week 1): Pt will complete mildly complex problems with Mod A cues.   Skilled Therapeutic Interventions: Skilled treatment session focused on cognition goals. SLP facilitated session by providing Min A cues to answer questions from newspaper articles (read to pt by SLP). Pt able to sustain attention for ~ 45 minutes with Mod I. Pt with better recall of information pertaining to schedule (had schedule present at bedside - which is improvement) and able to refer to schedule to answer questions. Pt with improvement during each session. Pt left in bed and all needs within reach. Continue per current plan of care.      Function:    Cognition Comprehension Comprehension assist level: Understands basic 90% of the time/cues < 10% of the time  Expression   Expression assist level: Expresses basic needs/ideas: With extra time/assistive device  Social Interaction Social Interaction assist level: Interacts appropriately 90% of the time - Needs monitoring or encouragement for participation or interaction.  Problem Solving Problem solving assist level: Solves basic 90% of the time/requires cueing < 10% of the time  Memory Memory assist level: Recognizes or recalls 75 - 89% of the time/requires cueing 10 - 24% of the time    Pain Pain Assessment Pain  Assessment: 0-10 Pain Score: 0-No pain Faces Pain Scale: No hurt  Therapy/Group: Individual Therapy  Peter [name] 04/20/2017, 11:37 AM

## 2017-04-20 NOTE — Progress Notes (Signed)
Physical Therapy Session Note  Patient Details  Name: Peter [name] MRN: [id number] Date of Birth: 03/28/1961  Today's Date: 04/20/2017 PT Individual Time: 1630-1700 PT Individual Time Calculation (min): 30 min   Short Term Goals: Week 1:  PT Short Term Goal 1 (Week 1): Pt will transfer from bed<>w/c with mod assist PT Short Term Goal 2 (Week 1): Pt will perform sit<>stand with max assist +1 PT Short Term Goal 3 (Week 1): Pt will initiate gait training PT Short Term Goal 4 (Week 1): Pt will propel w/c x 91ft with supervision  Skilled Therapeutic Interventions/Progress Updates:   Supine therex for improved NMR control. SAQ, hip flexion/extension, hip abduction, hip adduction. All completed x 10 BLE with AAROM to achieve full ROM.   Orthotist present for fitting of Resting night splints. Education for pt and family on use of Night splints for hand and PRAFOs for BLE. Pt requesting use of tband to stabilize feet /LE in neutral position, PT instructed pt on why this might not be the best course of action to protect skin.      Therapy Documentation Precautions:  Precautions Precautions: Fall Precaution Comments: trach, PEG, catheter  Restrictions Weight Bearing Restrictions: Yes RLE Weight Bearing: Weight bearing as tolerated LLE Weight Bearing: Weight bearing as tolerated Other Position/Activity Restrictions: no ROM restrictions bil LE General:   Vital Signs: Therapy Vitals Temp: 97.8 F (36.6 C) Temp Source: Oral Pulse Rate: 100 Resp: 18 BP: 126/85 Patient Position (if appropriate): Lying Oxygen Therapy SpO2: 100 % O2 Device: Not Delivered Pain: Pain Assessment Pain Score: 2  Faces Pain Scale: Hurts a little bit PAINAD (Pain Assessment in Advanced Dementia) Breathing: normal Critical Care Pain Observation Tool (CPOT) Facial Expression: Relaxed, neutral   See Function Navigator for Current Functional Status.   Therapy/Group: Individual Therapy  Lorie Phenix 04/20/2017, 5:14 PM

## 2017-04-20 NOTE — Progress Notes (Signed)
Occupational Therapy Session Note  Patient Details  Name: Peter [name] MRN: [id number] Date of Birth: [birth year]  Today's Date: 04/20/2017 OT Individual Time: 8184-0375 OT Individual Time Calculation (min): 57 min    Short Term Goals: Week 1:  OT Short Term Goal 1 (Week 1): Pt will complete self feeding with Mod A and AE PRN OT Short Term Goal 2 (Week 1): Pt will complete LB dressing at sit<stand level with LRAD OT Short Term Goal 3 (Week 1): Pt will BSC transfer with 1 helper and LRAD  Skilled Therapeutic Interventions/Progress Updates:    OT treatment session focused on self-feeding, UE there-ex, sitting balance, activity tolerance, and fine motor strength/coordination. Pt finishing lunch upon OT arrival. Pt utilized built up handles on utensils and was able to feed self with R hand only. Pt needed set-up to turn plate as he was unable to reach 1/2 of plate. Pt then came to sitting EOB with assistance to advance LEs off bed and min/Mod A to elevate trunk. Worked on reciprocal scooting to EOB with mod A. Pt tolerated 15 mins sitting EOB without back support while working on hand exercises. Provided pt with soft yellow thera-putty and worked on whole hand grasp, isolated finger strength, flat pinch, and lateral pinch. Focus on R hand more than L. Gentle AAROM shoulder flex/ext, abd/add, elbow flex/ext, forearm pronation/supination, wrist flex/ext. Pt returned to bed at end of session and left semi-reclined with needs met.  Therapy Documentation Precautions:  Precautions Precautions: Fall Precaution Comments: trach, PEG, catheter  Restrictions Weight Bearing Restrictions: Yes RLE Weight Bearing: Weight bearing as tolerated LLE Weight Bearing: Weight bearing as tolerated Other Position/Activity Restrictions: no ROM restrictions bil LE Pain:  denies pain ADL: ADL ADL Comments: Please see functional navigator for ADL status  See Function Navigator for Current Functional  Status.  Therapy/Group: Individual Therapy  Valma Cava 04/20/2017, 1:58 PM

## 2017-04-21 ENCOUNTER — Inpatient Hospital Stay (HOSPITAL_COMMUNITY): Payer: BLUE CROSS/BLUE SHIELD | Admitting: Physical Therapy

## 2017-04-21 ENCOUNTER — Inpatient Hospital Stay (HOSPITAL_COMMUNITY): Payer: BLUE CROSS/BLUE SHIELD

## 2017-04-21 ENCOUNTER — Inpatient Hospital Stay (HOSPITAL_COMMUNITY): Payer: BLUE CROSS/BLUE SHIELD | Admitting: Speech Pathology

## 2017-04-21 DIAGNOSIS — E119 Type 2 diabetes mellitus without complications: Secondary | ICD-10-CM

## 2017-04-21 DIAGNOSIS — R000 Tachycardia, unspecified: Secondary | ICD-10-CM

## 2017-04-21 DIAGNOSIS — D62 Acute posthemorrhagic anemia: Secondary | ICD-10-CM

## 2017-04-21 LAB — GLUCOSE, CAPILLARY
GLUCOSE-CAPILLARY: 165 mg/dL — AB (ref 65–99)
GLUCOSE-CAPILLARY: 121 mg/dL — AB (ref 65–99)
GLUCOSE-CAPILLARY: 92 mg/dL (ref 65–99)
Glucose-Capillary: 119 mg/dL — ABNORMAL HIGH (ref 65–99)

## 2017-04-21 MED ORDER — METOPROLOL TARTRATE 25 MG PO TABS
25.0000 mg | ORAL_TABLET | Freq: Two times a day (BID) | ORAL | Status: DC
  Administered 2017-04-21 – 2017-05-07 (×32): 25 mg via ORAL
  Filled 2017-04-21 (×31): qty 1

## 2017-04-21 NOTE — Progress Notes (Signed)
Occupational Therapy Note  Patient Details  Name: Peter [name] MRN: [id number] Date of Birth: December 13, 1960  Today's Date: 04/21/2017 OT Individual Time: 1345-1430 OT Individual Time Calculation (min): 45 min   Pt c/o ongoing abdominal pain; RN aware Individual Therapy  Pt resting in bed upon arrival with family present.  Focus on BUE PROM/AROM/stretching to increase BUE use and independence with BADLs.  Kinesio Tape reapplied to RUE hand for edema management.  Pt remained in bed with family present and all needs within reach.    [name] [facility] 04/21/2017, 2:38 PM

## 2017-04-21 NOTE — Progress Notes (Signed)
Speech Language Pathology Daily Session Note  Patient Details  Name: Peter [name] MRN: [id number] Date of Birth: December 21, 1960  Today's Date: 04/21/2017 SLP Individual Time: 1100-1200 SLP Individual Time Calculation (min): 60 min  Short Term Goals: Week 1: SLP Short Term Goal 1 (Week 1): Pt will utilize external memory aids to recall new daily information with Min A cues.  SLP Short Term Goal 2 (Week 1): Pt will sustain attention to basic familiar tasks for ~ 30 minutes with Min A cues.  SLP Short Term Goal 3 (Week 1): Pt will demonstrate intellectual awarness by identifying 3 physcial and 3 cognitive deficits with Mod A cues.  SLP Short Term Goal 4 (Week 1): Pt will complete mildly complex problems with Mod A cues.   Skilled Therapeutic Interventions: Skilled treatment session focused on cognition goals. SLP received pt in bed, pt states that he didn't sleep very much previous night and has experienced fluctuating blood pressure and heart rate. Pt with decreased respiratory support d/t fatigue. Pt also air leakage around bandage at stoma sight. Pt able to recall and place 2 fingers on bandage. Pt required more than reasonable amount of time to respond to questions and needed increased cues to sustain attention to task. Pt able to retain and recall information presented at beginning of session for ~ 15 minute intervals.  Will attempt to treat pt following OT session on Monday to perhaps pt upright at table. Pt left laying in bed with sister present. All needs within reach.     Function:    Cognition Comprehension Comprehension assist level: Understands basic 90% of the time/cues < 10% of the time  Expression   Expression assist level: Expresses basic needs/ideas: With extra time/assistive device  Social Interaction Social Interaction assist level: Interacts appropriately 90% of the time - Needs monitoring or encouragement for participation or interaction.  Problem Solving Problem solving  assist level: Solves basic 90% of the time/requires cueing < 10% of the time  Memory Memory assist level: Recognizes or recalls 75 - 89% of the time/requires cueing 10 - 24% of the time    Pain    Therapy/Group: Individual Therapy  Mikia Delaluz 04/21/2017, 2:10 PM

## 2017-04-21 NOTE — Progress Notes (Addendum)
[facility]     PROGRESS NOTE    Subjective/Complaints: Elevated HR last night, probably an inaccurate reading. Pt was not in distress. Abdomen a little more distended this morning but non-tender. Moved bowels last night  ROS: pt denies nausea, vomiting, diarrhea, cough, shortness of breath or chest pain    Objective: Vital Signs: Blood pressure 126/83, pulse (!) 105, temperature (!) 97.5 F (36.4 C), temperature source Oral, resp. rate 17, height 5\' 10"  (1.778 m), weight 68 kg (149 lb 14.6 oz), SpO2 98 %. No results found. No results for input(s): WBC, HGB, HCT, PLT in the last 72 hours. No results for input(s): NA, K, CL, GLUCOSE, BUN, CREATININE, CALCIUM in the last 72 hours.  Invalid input(s): CO CBG (last 3)   Recent Labs  04/20/17 1639 04/20/17 2145 04/21/17 0641  GLUCAP 163* 183* 165*    Wt Readings from Last 3 Encounters:  04/19/17 68 kg (149 lb 14.6 oz)  03/23/17 74.4 kg (164 lb 0.4 oz)    Physical Exam:  Constitutional: frail appearing.  HENT:  Head: Normocephalicand atraumatic.  Eyes: Pupils are equal, round, and reactive to light. EOMare normal.  Neck: Normal range of motion. Neck supple. No thyromegalypresent.  Trach stoma closed,granulating in. Still ?opening/air leak Cardiovascular: IRR, HR in 100's Respiratory: CTA Bilaterally without wheezes or rales. Normal effort  GI: sl decreased bowel sounds. Belly more distended. Non-tender Gastrostomy tube in place  Skin. Warm and dry, thin Musculoskeletal. Patient with +1 edema to the right hand. Palpable pedal pulses lower extremities Genitourinary. Suprapubic tube in place Neurological. Awake and alert. Limited insight and awareness. Follows simple commands.  Oriented to place, person, month, reason he's here. UE 3+/5 Deltoid, biceps, triceps and 4- HI. LE: 2 to 2+/5 HF, KEand 3+ ADF/PF. No sensory deficits. No substantial changes in motor exam Psych: cooperative and  appropriate. Flat.   Assessment/Plan: 1. 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 Physiatrist is providing close team supervision and 24 hour management of active medical problems listed below. Physiatrist and rehab team continue to assess barriers to discharge/monitor patient progress toward functional and medical goals.  Function:  Bathing Bathing position   Position: Bed  Bathing parts Body parts bathed by patient: Chest, Abdomen Body parts bathed by helper: Right arm, Left arm, Buttocks, Right upper leg, Left upper leg, Right lower leg, Left lower leg, Back  Bathing assist Assist Level: 2 helpers      Upper Body Dressing/Undressing Upper body dressing   What is the patient wearing?: Pull over shirt/dress     Pull over shirt/dress - Perfomed by patient: Thread/unthread left sleeve Pull over shirt/dress - Perfomed by helper: Thread/unthread right sleeve, Thread/unthread left sleeve, Put head through opening, Pull shirt over trunk        Upper body assist Assist Level:  (total assist)      Lower Body Dressing/Undressing Lower body dressing   What is the patient wearing?: Pants, Non-skid slipper socks       Pants- Performed by helper: Thread/unthread right pants leg, Thread/unthread left pants leg, Pull pants up/down Non-skid slipper socks- Performed by patient: Don/doff right sock, Don/doff left sock                    Lower body assist Assist for lower body dressing:  (Total A)      Toileting Toileting   Toileting steps  completed by patient: Adjust clothing prior to toileting, Adjust clothing after toileting Toileting steps completed by helper: Adjust clothing prior to toileting, Performs perineal hygiene, Adjust clothing after toileting    Toileting assist Assist level: Two helpers   Transfers Chair/bed transfer   Chair/bed  transfer method: Lateral scoot Chair/bed transfer assist level: Moderate assist (Pt 50 - 74%/lift or lower) Chair/bed transfer assistive device: Armrests, Sliding board Mechanical lift: Teacher, music activity did not occur: Safety/medical concerns         Wheelchair   Type: Manual Max wheelchair distance: 50 Assist Level: Touching or steadying assistance (Pt > 75%)  Cognition Comprehension Comprehension assist level: Understands basic 90% of the time/cues < 10% of the time  Expression Expression assist level: Expresses basic needs/ideas: With extra time/assistive device  Social Interaction Social Interaction assist level: Interacts appropriately 90% of the time - Needs monitoring or encouragement for participation or interaction.  Problem Solving Problem solving assist level: Solves basic 90% of the time/requires cueing < 10% of the time  Memory Memory assist level: Recognizes or recalls 75 - 89% of the time/requires cueing 10 - 24% of the time   Medical Problem List and Plan: 1. Decreased functional mobility with cognitive deficitssecondary to TBI/polytrauma 02/07/2017, additional deconditioning due to prolonged hospital stay -continue PT, OT SLP 2. DVT Prophylaxis/Anticoagulation: Subcutaneous Lovenox. Check vascular study pending 3. Pain Management: Oxycodone 5 mg every 6 hours as needed -fair control 4. Mood: Clonazepam 0.5 mg daily at bedtime, trazodone 50 mg daily at bedtime, Seroquel 100 mg daily at bedtime, Zoloft 50 mg daily, amantadine 100 mg daily, Provigil 100 mg daily  -may be able to wean some of his sedatives to help day time arousal 5. Neuropsych: This patient iscapable of making decisions on hisown behalf. 6. Skin/Wound Care: Routine skin checks  -  air mattress due to sensitive skin  -WOC RN consulted for any recs 7. Fluids/Electrolytes/Nutrition: Routine I&O with follow-up chemistries 8.Nondisplaced fractures  of right T7 transverse process. Conservative care 9.Multiple pelvic fractures/bilateral sacroiliac joints and suprapubic symphysis as well as left pelvic sidewall hematoma. Status post sacroiliac screw fixation with external fixation pelvis anterior pelvic ring closed reduction 02/07/2017. May ambulate extended distances weightbearing as tolerated without restrictions. 10.Seizure prophylaxis. Keppra 500 mg every 12 hours. EEG negative 11.Tracheostomy tube 03/01/2017 per Dr. Hulen Skains. Decannulated. Continue occlusive dressing 12.Gastrostomy tube 03/01/2017 per Dr. Hulen Skains. Presently not receiving tube feeds. Diet advanced to mechanical soft and liquids.  -check KUB today given abdominal distention 13.Urinary retention/urethral injury. Status post suprapubic catheter placement 03/21/2017 per interventional radiology. Follow-up urology services Dr. Louis Meckel  -will check on duration next week 14.Acute blood loss anemia. Follow-up CBC. Continue iron supplement 15. DM 2: SSI, CBG's TID and HS  -on CM diet  -added amaryl 10/29---increased to 2mg  10/31 with improving control 16. Tachycardia  -pt with documented PAC's on most recent cxr  -likely due to deconditioning  -need to check HR apically  -increase metoprolol to 25mg  BID   LOS (Days) 6 A FACE TO FACE EVALUATION WAS PERFORMED  [name], MD 04/21/2017 8:33 AM

## 2017-04-21 NOTE — Progress Notes (Signed)
Physical Therapy Session Note  Patient Details  Name: Peter [name] MRN: [id number] Date of Birth: 1960/07/18  Today's Date: 04/21/2017 PT Individual Time: 0800-0900 PT Individual Time Calculation (min): 60 min   Short Term Goals: Week 1:  PT Short Term Goal 1 (Week 1): Pt will transfer from bed<>w/c with mod assist PT Short Term Goal 2 (Week 1): Pt will perform sit<>stand with max assist +1 PT Short Term Goal 3 (Week 1): Pt will initiate gait training PT Short Term Goal 4 (Week 1): Pt will propel w/c x 88ft with supervision  Skilled Therapeutic Interventions/Progress Updates: Pt received supine in bed, denies pain and agreeable to treatment. Pt's sister present for session; provided education regarding goals, progression, purpose of various therapeutic activities. Rolling R/L min guard with bedrails to check brief as pt reports needing to be changed; brief clean at this time. Lateral scoot transfer to/from tilt table totalA. TEDs and ace wraps donned to A with BP management and reduce orthostatic hypotension.   Supine BP 120/76 40 degrees BP 89/68 Supine recovery BP 130/85 40 degrees BP 95/67  Performed heel raises x15, mini squats x20 AAROM with table at 40 degrees. Returned to bed totalA as above. Demonstrated to pt's sister how to stretch gastroc, soleus, glutes, hamstrings, hip external rotators. Pt's sister took pictures of LE in each position to show to other family members. Discussed recommendations for 2-3x/day and hold 30-60 sec each stretch. Remained supine in bed, all needs in reach.      Therapy Documentation Precautions:  Precautions Precautions: Fall Precaution Comments: trach, PEG, catheter  Restrictions Weight Bearing Restrictions: Yes RLE Weight Bearing: Weight bearing as tolerated LLE Weight Bearing: Weight bearing as tolerated Other Position/Activity Restrictions: no ROM restrictions bil LE   See Function Navigator for Current Functional  Status.   Therapy/Group: Individual Therapy  Luberta Mutter 04/21/2017, 10:30 AM

## 2017-04-21 NOTE — Progress Notes (Signed)
Occupational Therapy Session Note  Patient Details  Name: Cadarius Nevares MRN: [id number] Date of Birth: [birth year]  Today's Date: 04/21/2017 OT Individual Time: 1000-1100 OT Individual Time Calculation (min): 60 min    Short Term Goals: Week 1:  OT Short Term Goal 1 (Week 1): Pt will complete self feeding with Mod A and AE PRN OT Short Term Goal 2 (Week 1): Pt will complete LB dressing at sit<stand level with LRAD OT Short Term Goal 3 (Week 1): Pt will BSC transfer with 1 helper and LRAD  Skilled Therapeutic Interventions/Progress Updates:    Pt resting in bed upon arrival with sister present.  Focus on bed mobility, sitting balance, and sit<>stand with Stedy. Pt experienced minimal symptoms with transitional movements (BP as follows: supine-118/80, HR 102, sitting=99/69, HR 115, sitting in Stedy-89/70, HR 118) with Ace wraps donned. Pt returned to supine in bed.    Therapy Documentation Precautions:  Precautions Precautions: Fall Precaution Comments: trach, PEG, catheter  Restrictions Weight Bearing Restrictions: Yes RLE Weight Bearing: Weight bearing as tolerated LLE Weight Bearing: Weight bearing as tolerated Other Position/Activity Restrictions: no ROM restrictions bil LE Pain:  Pt winces with transitional movements but no specific c/o pain.  See Function Navigator for Current Functional Status.   Therapy/Group: Individual Therapy  [name] 04/21/2017, 11:59 AM

## 2017-04-22 ENCOUNTER — Inpatient Hospital Stay (HOSPITAL_COMMUNITY): Payer: BLUE CROSS/BLUE SHIELD | Admitting: Occupational Therapy

## 2017-04-22 ENCOUNTER — Inpatient Hospital Stay (HOSPITAL_COMMUNITY): Payer: BLUE CROSS/BLUE SHIELD | Admitting: *Deleted

## 2017-04-22 LAB — GLUCOSE, CAPILLARY
GLUCOSE-CAPILLARY: 117 mg/dL — AB (ref 65–99)
GLUCOSE-CAPILLARY: 164 mg/dL — AB (ref 65–99)
GLUCOSE-CAPILLARY: 119 mg/dL — AB (ref 65–99)
Glucose-Capillary: 132 mg/dL — ABNORMAL HIGH (ref 65–99)

## 2017-04-22 NOTE — Progress Notes (Signed)
Occupational Therapy Session Note  Patient Details  Name: Norbert Malkin MRN: [id number] Date of Birth: [birth year]  Today's Date: 04/22/2017 OT Individual Time:  -   0900-1000  (60 min)      Short Term Goals: Week 1:  OT Short Term Goal 1 (Week 1): Pt will complete self feeding with Mod A and AE PRN OT Short Term Goal 2 (Week 1): Pt will complete LB dressing at sit<stand level with LRAD OT Short Term Goal 3 (Week 1): Pt will BSC transfer with 1 helper and LRAD Week 2:     Skilled Therapeutic Interventions/Progress Updates:    Focus of treatment was bed mobility,,  Neuro-muscular reeducation, therapeutic activities, BP management.  Pt supine.  BP taken throughout session.  intial Bp= 122/72.  Positioned HOB to 30 degrees.  BP= 119/71.  OT d  Donned knee highs and ace wrapping for BP control.  Positioned bed to 45 degrees; BP= 130/78.  Provided therapeutic exercises for rolling in bed, LE streching and positioning with hip/knee flexion, ankle dorsiflexion.  Pt tolerated with no problems.   Pt remained in bed with wife present and all items in reach.     Therapy Documentation Precautions:  Precautions Precautions: Fall Precaution Comments: trach, PEG, catheter  Restrictions Weight Bearing Restrictions: Yes RLE Weight Bearing: Weight bearing as tolerated LLE Weight Bearing: Weight bearing as tolerated Other Position/Activity Restrictions: no ROM restrictions bil LE       Pain: some pain at the end range of stretching   ADL: ADL ADL Comments: Please see functional navigator for ADL status            See Function Navigator for Current Functional Status.   Therapy/Group: Individual Therapy  Lisa Roca 04/22/2017, 12:56 PM

## 2017-04-22 NOTE — Progress Notes (Signed)
Physical Therapy Session Note  Patient Details  Name: Peter [name] MRN: [id number] Date of Birth: May 01, 1961  Today's Date: 04/22/2017 PT Individual Time: 1435-1530 PT Individual Time Calculation (min): 55 min   Short Term Goals: Week 1:  PT Short Term Goal 1 (Week 1): Pt will transfer from bed<>w/c with mod assist PT Short Term Goal 2 (Week 1): Pt will perform sit<>stand with max assist +1 PT Short Term Goal 3 (Week 1): Pt will initiate gait training PT Short Term Goal 4 (Week 1): Pt will propel w/c x 31f with supervision  Skilled Therapeutic Interventions/Progress Updates: Pt presented in bed agreeable to therapy. Session focused on upright tolerance with use of tilt table. Pt total assist to transfer to tilt table. Orthostatics as follows:  Supine 123/79 Upright to 40*  After 5 min95/72 Increase to 50* 93/70 After 5 min at 50* 83/67 - return to supine  Second trial  Supine 120/82 Upright to 50* 87/70 - asymptomatic 5 min at 50* 86/66 return to supine.  Pt returned to bed total assist x2 and pt able to roll L/R with mod A for repositioning. Pt left in bed with soft touch call bell within reach and needs met.      Therapy Documentation Precautions:  Precautions Precautions: Fall Precaution Comments: trach, PEG, catheter  Restrictions Weight Bearing Restrictions: Yes RLE Weight Bearing: Weight bearing as tolerated LLE Weight Bearing: Weight bearing as tolerated Other Position/Activity Restrictions: no ROM restrictions bil LE General:   Vital Signs: Therapy Vitals Temp: (!) 97.4 F (36.3 C) Temp Source: Oral Pulse Rate: 95 Resp: 17 BP: 132/81 Patient Position (if appropriate): Lying Oxygen Therapy SpO2: 100 % O2 Device: Not Delivered   See Function Navigator for Current Functional Status.   Therapy/Group: Individual Therapy  Keoki Mchargue  Allah Reason, PTA  04/22/2017, 3:50 PM

## 2017-04-22 NOTE — Progress Notes (Signed)
Patient ID: Peter [name], male   DOB: 07/31/1960, 56 y.o.   MRN: [id number]   04/22/2017.  Peter [name] is a 56 y.o. male who is admitted for CIR with decreased functional mobility with cognitive deficitssecondary to TBI/polytrauma 02/07/2017, additional deconditioning due to prolonged hospital stay.  Past Medical History:  Diagnosis Date  . Diabetes mellitus without complication ([place])   . High cholesterol   . Multiple closed anterior-posterior compression fractures of pelvis ([place]) 02/08/2017     Subjective: No new complaints.  Remains weak, alert without focal complaints.  Objective: Vital signs in last 24 hours: Temp:  [97.6 F (36.4 C)-97.7 F (36.5 C)] 97.6 F (36.4 C) (11/03 0500) Pulse Rate:  [94-109] 100 (11/03 0500) Resp:  [16] 16 (11/03 0500) BP: (122-126)/(76-84) 126/82 (11/03 0500) SpO2:  [99 %] 99 % (11/03 0500) Weight change:  Last BM Date: 04/21/17  Intake/Output from previous day: 11/02 0701 - 11/03 0700 In: 600 [P.O.:600] Out: 1500 [Urine:1500] Last cbgs: CBG (last 3)   Recent Labs  04/21/17 1614 04/21/17 2021 04/22/17 0633  GLUCAP 92 119* 117*     Physical Exam General: No apparent distress   HEENT: Healing tracheostomy Lungs: Normal effort. Lungs clear to auscultation, no crackles or wheezes. Cardiovascular: Regular rate and rhythm, no edema Abdomen: Status post PEG; status post suprapubic catheter Musculoskeletal:  unchanged Neurological: No new neurological deficits. Alert, weak, generalized weakness Wounds: Scattered areas of skin breakdown with bandages in place  Mental state: Alert, oriented, cooperative    Lab Results: BMET    Component Value Date/Time   NA 134 (L) 04/15/2017 1613   K 4.8 04/15/2017 1613   CL 99 (L) 04/15/2017 1613   CO2 27 04/15/2017 1613   GLUCOSE 180 (H) 04/15/2017 1613   BUN 36 (H) 04/15/2017 1613   CREATININE 0.88 04/15/2017 1613   CALCIUM 8.8 (L) 04/15/2017 1613   GFRNONAA >60 04/15/2017 1613    GFRAA >60 04/15/2017 1613   CBC    Component Value Date/Time   WBC 9.6 04/15/2017 1613   RBC 3.76 (L) 04/15/2017 1613   HGB 10.9 (L) 04/15/2017 1613   HCT 34.0 (L) 04/15/2017 1613   PLT 411 (H) 04/15/2017 1613   MCV 90.4 04/15/2017 1613   MCH 29.0 04/15/2017 1613   MCHC 32.1 04/15/2017 1613   RDW 14.9 04/15/2017 1613   LYMPHSABS 2.2 04/15/2017 1613   MONOABS 0.9 04/15/2017 1613   EOSABS 0.3 04/15/2017 1613   BASOSABS 0.0 04/15/2017 1613    Studies/Results: Dg Abd Portable 1v  Result Date: 04/21/2017 CLINICAL DATA:  Abdominal distension. EXAM: PORTABLE ABDOMEN - 1 VIEW COMPARISON:  Radiograph February 18, 2017. FINDINGS: Nasogastric tube has been removed. Embolization coils are noted in left upper quadrant. Probable gallstones are noted. Gastrostomy tube is in grossly good position. Large amount of stool seen throughout the colon. No small bowel dilatation is noted. Drainage catheter is noted in the pelvis. Air-filled transverse colon is noted without dilatation. IMPRESSION: Interval placement of gastrostomy tube in grossly good position. Large stool burden is noted. Cholelithiasis. Drainage catheter in pelvis. No definite evidence of bowel obstruction. Electronically Signed   By: Marijo Conception, M.D.   On: 04/21/2017 12:03    Medications: I have reviewed the patient's current medications.  Assessment/Plan:   Decreased functional mobility with cognitive deficitssecondary to TBI/polytrauma 02/07/2017, additional deconditioning due to prolonged hospital stay -continue PT, OT SLP DM- stable with good glycemic control S/p suprapubic cath S/p multiple pelvic fractures  Length of stay, days: [place] , MD 04/22/2017, 8:50 AM

## 2017-04-23 ENCOUNTER — Inpatient Hospital Stay (HOSPITAL_COMMUNITY): Payer: BLUE CROSS/BLUE SHIELD

## 2017-04-23 LAB — GLUCOSE, CAPILLARY
GLUCOSE-CAPILLARY: 98 mg/dL (ref 65–99)
GLUCOSE-CAPILLARY: 54 mg/dL — AB (ref 65–99)
GLUCOSE-CAPILLARY: 94 mg/dL (ref 65–99)
GLUCOSE-CAPILLARY: 110 mg/dL — AB (ref 65–99)
Glucose-Capillary: 137 mg/dL — ABNORMAL HIGH (ref 65–99)

## 2017-04-23 NOTE — Progress Notes (Signed)
Patient ID: Peter [name], male   DOB: February 23, 1961, 56 y.o.   MRN: [id number]   04/23/17.  Peter [name] is a 56 y.o. male who was admitted for CIR due to functional mobility with cognitive deficits secondary to TBI and polytrauma.  He has had significant deconditioning due to prolonged hospital stay.  Past Medical History:  Diagnosis Date  . Diabetes mellitus without complication ([place])   . High cholesterol   . Multiple closed anterior-posterior compression fractures of pelvis ([place]) 02/08/2017      Subjective: No new complaints. No new problems.  Remains weak.  No focal complaints    Objective: Vital signs in last 24 hours: Temp:  [97.4 F (36.3 C)-97.7 F (36.5 C)] 97.7 F (36.5 C) (11/04 0500) Pulse Rate:  [95-106] 106 (11/04 0500) Resp:  [16-17] 16 (11/04 0500) BP: (132-143)/(81-83) 143/83 (11/04 0500) SpO2:  [95 %-100 %] 95 % (11/04 0500) Weight change:  Last BM Date: 04/22/17  Intake/Output from previous day: 11/03 0701 - 11/04 0700 In: 720 [P.O.:720] Out: 1250 [Urine:1250] Last cbgs: CBG (last 3)  Recent Labs    04/22/17 1642 04/22/17 2124 04/23/17 0620  GLUCAP 119* 132* 98     Physical Exam General: No apparent distress   HEENT: Healing tracheostomy scar Lungs: Normal effort. Lungs clear to auscultation, no crackles or wheezes. Cardiovascular: Regular rate and rhythm; rate approximately 100; occasional ectopics Abdomen: S/NT/ND; BS(+); status post PEG.  Status post suprapubic catheter Musculoskeletal:  unchanged Neurological: No new neurological deficits Extremities-no edema Skin: clear  .  Scattered areas of skin breakdown.  Heel pads in place Mental state: Alert, oriented, cooperative    Lab Results: BMET    Component Value Date/Time   NA 134 (L) 04/15/2017 1613   K 4.8 04/15/2017 1613   CL 99 (L) 04/15/2017 1613   CO2 27 04/15/2017 1613   GLUCOSE 180 (H) 04/15/2017 1613   BUN 36 (H) 04/15/2017 1613   CREATININE 0.88 04/15/2017 1613    CALCIUM 8.8 (L) 04/15/2017 1613   GFRNONAA >60 04/15/2017 1613   GFRAA >60 04/15/2017 1613   CBC    Component Value Date/Time   WBC 9.6 04/15/2017 1613   RBC 3.76 (L) 04/15/2017 1613   HGB 10.9 (L) 04/15/2017 1613   HCT 34.0 (L) 04/15/2017 1613   PLT 411 (H) 04/15/2017 1613   MCV 90.4 04/15/2017 1613   MCH 29.0 04/15/2017 1613   MCHC 32.1 04/15/2017 1613   RDW 14.9 04/15/2017 1613   LYMPHSABS 2.2 04/15/2017 1613   MONOABS 0.9 04/15/2017 1613   EOSABS 0.3 04/15/2017 1613   BASOSABS 0.0 04/15/2017 1613    Studies/Results: Dg Abd Portable 1v  Result Date: 04/21/2017 CLINICAL DATA:  Abdominal distension. EXAM: PORTABLE ABDOMEN - 1 VIEW COMPARISON:  Radiograph February 18, 2017. FINDINGS: Nasogastric tube has been removed. Embolization coils are noted in left upper quadrant. Probable gallstones are noted. Gastrostomy tube is in grossly good position. Large amount of stool seen throughout the colon. No small bowel dilatation is noted. Drainage catheter is noted in the pelvis. Air-filled transverse colon is noted without dilatation. IMPRESSION: Interval placement of gastrostomy tube in grossly good position. Large stool burden is noted. Cholelithiasis. Drainage catheter in pelvis. No definite evidence of bowel obstruction. Electronically Signed   By: Marijo Conception, M.D.   On: 04/21/2017 12:03    Medications: I have reviewed the patient's current medications.  Assessment/Plan:  Deconditioning/, decreased functional mobility with cognitive deficits secondary to TBI/polytrauma Continue PT, OT  and SLP Diabetes mellitus.  Nice glycemic control Status post suprapubic catheter Status post multitrauma with multiple pelvic fractures.  Continue analgesics as needed    Length of stay, days: [place] , MD 04/23/2017, 8:07 AM

## 2017-04-23 NOTE — Progress Notes (Signed)
Physical Therapy Session Note  Patient Details  Name: Peter [name] MRN: [id number] Date of Birth: March 19, 1961  Today's Date: 04/23/2017 PT Individual Time: 8527-7824 PT Individual Time Calculation (min): 70 min   Short Term Goals: Week 1:  PT Short Term Goal 1 (Week 1): Pt will transfer from bed<>w/c with mod assist PT Short Term Goal 2 (Week 1): Pt will perform sit<>stand with max assist +1 PT Short Term Goal 3 (Week 1): Pt will initiate gait training PT Short Term Goal 4 (Week 1): Pt will propel w/c x 75ft with supervision  Skilled Therapeutic Interventions/Progress Updates:    Pt supine in bed upon PT arrival, agreeable to therapy tx and denies pain. Pt transferred from supine>sidelying with min assist and sidelying>sitting EOB with max assist to lift LEs and help bring trunk up. Pt transferred from EOB<>w/c using slideboard and mod assist. Pt reported lightheadedness, vitals monitored with BP 95/65 and HR 102. Therapist donned ted hoes and ace wraps. Pt transported to gym in w/c total assist. Session focused on upright sitting tolerance and transfers. Pt transferred from w/c<>mat using slideboard and mod assist, tactile cues for weightshifting and verbal cues for hand placement. Pt reported feeling lightheaded, BP 92/60. Pt transferred sitting>supine on mat with mod assist, therapeutic rest break to allow BP to come back up while therapist performed manual hamstring and calf stretches 2 x 30 sec each. Pt transferred from supine back to sitting with max assist. Worked on sitting<>sidelying on elbow x 3 each side with min assist and increased time. Pt performed x 3 lifts on the mat in order to clear butocks, using UEs to push through yoga blocks and manual facilitation to push through LEs, pt able to hold 2-3 seconds. Pt transferred back to w/c and then back to bed using slideboard and mod assist. Transferred sitting>supine in bed with mod assist, pt left supine in bed with RN present for  dressing change and needs in reach, family present.  Therapy Documentation Precautions:  Precautions Precautions: Fall Precaution Comments: trach, PEG, catheter  Restrictions Weight Bearing Restrictions: Yes RLE Weight Bearing: Weight bearing as tolerated LLE Weight Bearing: Weight bearing as tolerated Other Position/Activity Restrictions: no ROM restrictions bil LE   See Function Navigator for Current Functional Status.   Therapy/Group: Individual Therapy  Netta Corrigan, PT, DPT 04/23/2017, 12:32 PM

## 2017-04-23 NOTE — Progress Notes (Addendum)
Patient complained of sound coming from neck where previous trach had been but has been removed and bandaged. Assessed area. No signs of respiratory distress or drainage. Even/Unlabored breathing. Chest symmetrical. Skin color appropriate for ethnicity. Contacted respiratory. Respiratory assessed. Reapplied new bandage. Previous bandage may have been lose but patient says he is okay and doesn't hear the sound as much anymore. Respiratory said it could be because the area is still open. Will continue to monitor.

## 2017-04-24 ENCOUNTER — Inpatient Hospital Stay (HOSPITAL_COMMUNITY): Payer: BLUE CROSS/BLUE SHIELD | Admitting: Occupational Therapy

## 2017-04-24 ENCOUNTER — Inpatient Hospital Stay (HOSPITAL_COMMUNITY): Payer: BLUE CROSS/BLUE SHIELD | Admitting: Physical Therapy

## 2017-04-24 ENCOUNTER — Inpatient Hospital Stay (HOSPITAL_COMMUNITY): Payer: BLUE CROSS/BLUE SHIELD | Admitting: Speech Pathology

## 2017-04-24 LAB — GLUCOSE, CAPILLARY
GLUCOSE-CAPILLARY: 179 mg/dL — AB (ref 65–99)
GLUCOSE-CAPILLARY: 116 mg/dL — AB (ref 65–99)
GLUCOSE-CAPILLARY: 126 mg/dL — AB (ref 65–99)
Glucose-Capillary: 105 mg/dL — ABNORMAL HIGH (ref 65–99)

## 2017-04-24 MED ORDER — TRAZODONE HCL 50 MG PO TABS
50.0000 mg | ORAL_TABLET | Freq: Every day | ORAL | Status: DC
  Administered 2017-04-24: 50 mg via ORAL
  Filled 2017-04-24: qty 1

## 2017-04-24 NOTE — Progress Notes (Signed)
Speech Language Pathology Weekly Progress and Session Note  Patient Details  Name: Peter [name] MRN: [id number] Date of Birth: 1961-04-02  Beginning of progress report period: April 17, 2017 End of progress report period: April 24, 2017  Today's Date: 04/24/2017 SLP Individual Time: 1000-1100 SLP Individual Time Calculation (min): 60 min  Short Term Goals: Week 1: SLP Short Term Goal 1 (Week 1): Pt will utilize external memory aids to recall new daily information with Min A cues.  SLP Short Term Goal 1 - Progress (Week 1): Met SLP Short Term Goal 2 (Week 1): Pt will sustain attention to basic familiar tasks for ~ 30 minutes with Min A cues.  SLP Short Term Goal 2 - Progress (Week 1): Met SLP Short Term Goal 3 (Week 1): Pt will demonstrate intellectual awarness by identifying 3 physcial and 3 cognitive deficits with Mod A cues.  SLP Short Term Goal 3 - Progress (Week 1): Not met SLP Short Term Goal 4 (Week 1): Pt will complete mildly complex problems with Mod A cues.  SLP Short Term Goal 4 - Progress (Week 1): Met    New Short Term Goals: Week 2: SLP Short Term Goal 1 (Week 2): Pt will utilize external memory aids to recall new daily information with supervision cues.  SLP Short Term Goal 2 (Week 2): Pt will selective attention for ~ 30 minutes with Min A cues.  SLP Short Term Goal 3 (Week 2): Pt will demonstrate intellectual awarness by identifying 3 physcial and 3 cognitive deficits with Min A cues.  SLP Short Term Goal 4 (Week 2): Pt will complete mildly complex problems with Min A cues.   Weekly Progress Updates: Pt has made great progress over this reporting period and has met 3 of 4 STGs. Pt with continued deficits in higher level cognitive function and requires skilled ST to address.      Intensity: Minumum of 1-2 x/day, 30 to 90 minutes Frequency: Total of 15 hours over 7 days of combined therapies Duration/Length of Stay: 3 weeks Treatment/Interventions: Cognitive  remediation/compensation;Patient/family education;Medication managment;Internal/external aids;Environmental controls;Cueing hierarchy   Daily Session  Skilled Therapeutic Interventions: Skilled treatment session focused on cognition goals. SLP facilitated session by providing supervision to Mod I cues to sustain attention for ~ 60 minutes within quiet environment. Pt able to discuss 3 physical and 2 cognition deficits with Min A to supervision cues. Pt able to describe that catheter line was possibly crimped and was able to problem solve solution.Pt was left upright in bed, sister present and all needs within reach. Continue per current plan of care.    Function:     Cognition Comprehension Comprehension assist level: Understands basic 90% of the time/cues < 10% of the time  Expression   Expression assist level: Expresses basic 90% of the time/requires cueing < 10% of the time.  Social Interaction Social Interaction assist level: Interacts appropriately 90% of the time - Needs monitoring or encouragement for participation or interaction.  Problem Solving Problem solving assist level: Solves basic 75 - 89% of the time/requires cueing 10 - 24% of the time  Memory Memory assist level: Recognizes or recalls 75 - 89% of the time/requires cueing 10 - 24% of the time   General    Pain    Therapy/Group: Individual Therapy  Peter [name] 04/24/2017, 11:34 AM

## 2017-04-24 NOTE — Progress Notes (Deleted)
Speech Language Pathology Daily Session Note  Patient Details  Name: Peter [name] MRN: [id number] Date of Birth: 01/03/1961  Today's Date: 04/24/2017 SLP Individual Time: 1000-1100 SLP Individual Time Calculation (min): 60 min  Short Term Goals: Week 1: SLP Short Term Goal 1 (Week 1): Pt will utilize external memory aids to recall new daily information with Min A cues.  SLP Short Term Goal 2 (Week 1): Pt will sustain attention to basic familiar tasks for ~ 30 minutes with Min A cues.  SLP Short Term Goal 3 (Week 1): Pt will demonstrate intellectual awarness by identifying 3 physcial and 3 cognitive deficits with Mod A cues.  SLP Short Term Goal 4 (Week 1): Pt will complete mildly complex problems with Mod A cues.   Skilled Therapeutic Interventions:Skilled treatment session focused on cognition goals. SLP facilitated session by providing supervision to Mod I cues to sustain attention for ~ 60 minutes within quiet environment. Pt able to discuss 3 physical and 2 cognition deficits with Min A to supervision cues. Pt able to describe that catheter line was possibly crimped and was able to problem solve solution.Pt was left upright in bed, sister present and all needs within reach. Continue per current plan of care.      Function:    Cognition Comprehension Comprehension assist level: Understands basic 90% of the time/cues < 10% of the time  Expression   Expression assist level: Expresses basic 90% of the time/requires cueing < 10% of the time.  Social Interaction Social Interaction assist level: Interacts appropriately 90% of the time - Needs monitoring or encouragement for participation or interaction.  Problem Solving Problem solving assist level: Solves basic 75 - 89% of the time/requires cueing 10 - 24% of the time  Memory Memory assist level: Recognizes or recalls 75 - 89% of the time/requires cueing 10 - 24% of the time    Pain    Therapy/Group: Individual Therapy  Navdeep Fessenden 04/24/2017, 11:28 AM

## 2017-04-24 NOTE — Progress Notes (Signed)
Occupational Therapy Session Note  Patient Details  Name: Peter [name] MRN: [id number] Date of Birth: May 01, 1961  Today's Date: 04/24/2017 OT Individual Time: 0175-1025 OT Individual Time Calculation (min): 62 min    Short Term Goals: Week 1:  OT Short Term Goal 1 (Week 1): Pt will complete self feeding with Mod A and AE PRN OT Short Term Goal 2 (Week 1): Pt will complete LB dressing at sit<stand level with LRAD OT Short Term Goal 3 (Week 1): Pt will BSC transfer with 1 helper and LRAD  Skilled Therapeutic Interventions/Progress Updates:    Began session with pt in supine and therapist donning TEDs and applying ace wraps to BLEs.  Next applied PRAFOs as family had questions about the fit.  Noted RUE fitting well in either PRAFO (pt has two different types), but increased eversion noted in the LLE.  Therapist was able to donn PRAFO on the left foot in somewhat good position but does not fit as well as the left side.  BP taken in supine at 137/83.  Once completed, had pt transition to the EOB with overall mod assist.  BP taken in sitting at 107/74.  He was able to maintain sitting balance with supervision once sitting.  Mod assist for sliding board transfer to the wheelchair.  Took pt to the dayroom where therapist worked on [employer] and joint mobilizations to the carpals, MPs, and PIPs of the right hand.  Pt with approximately 60% of full AAROM grasp before mobilizations, increased to 80% with mobilizations and stretching.  Instructed pt to complete self AAROM using the left hand to assist the right when he is in his room.  Returned to room at end of session with call button and phone in reach and daughter and future son-in-law present as well.     Therapy Documentation Precautions:  Precautions Precautions: Fall Precaution Comments: trach, PEG, catheter  Required Braces or Orthoses: Other Brace/Splint Other Brace/Splint: bil PRAFO, abdominal binder, bil hand splints at  night Restrictions Weight Bearing Restrictions: No RLE Weight Bearing: Weight bearing as tolerated LLE Weight Bearing: Weight bearing as tolerated Other Position/Activity Restrictions: no ROM restrictions bil LE   Pain: Pain Assessment Pain Assessment: No/denies pain Pain Score: 0-No pain Patients Stated Pain Goal: 2 Multiple Pain Sites: No ADL: See Function Navigator for Current Functional Status.   Therapy/Group: Individual Therapy  Oseas Detty OTR/L 04/24/2017, 12:53 PM

## 2017-04-24 NOTE — Progress Notes (Signed)
[place] PHYSICAL MEDICINE & REHABILITATION     PROGRESS NOTE    Subjective/Complaints: Complains of not sleeping. Sister in room, says it's been for 4 nights. He did not mention Friday. Asked when PEG can come out  ROS: pt denies nausea, vomiting, diarrhea, cough, shortness of breath or chest pain    Objective: Vital Signs: Blood pressure 114/80, pulse 95, temperature 97.7 F (36.5 C), temperature source Oral, resp. rate 17, height 5\' 10"  (1.778 m), weight 68 kg (149 lb 14.6 oz), SpO2 97 %. No results found. No results for input(s): WBC, HGB, HCT, PLT in the last 72 hours. No results for input(s): NA, K, CL, GLUCOSE, BUN, CREATININE, CALCIUM in the last 72 hours.  Invalid input(s): CO CBG (last 3)  Recent Labs    04/23/17 1824 04/23/17 2159 04/24/17 0630  GLUCAP 94 110* 105*    Wt Readings from Last 3 Encounters:  04/19/17 68 kg (149 lb 14.6 oz)  03/23/17 74.4 kg (164 lb 0.4 oz)    Physical Exam:  Constitutional: frail appearing.  HENT:  Head: Normocephalicand atraumatic.  Eyes: Pupils are equal, round, and reactive to light. EOMare normal.  Neck: Normal range of motion. Neck supple. No thyromegalypresent.  Trach stoma closed,granulating in. Air leaking through stoma Cardiovascular: IRR Respiratory: CTA Bilaterally without wheezes or rales. Normal effort  GI: belly less distended today Gastrostomy tube in place  Skin. Warm and dry, thin Musculoskeletal. Patient with +1 edema to the right hand. Palpable pedal pulses lower extremities Genitourinary. Suprapubic tube in place Neurological. Awake and alert. Limited insight and awareness. Follows simple commands.  Oriented to place, person, month, reason he's here. UE 3+/5 Deltoid, biceps, triceps and 4- HI. LE: 2 to 2+/5 HF, KEand 3+ ADF/PF. No sensory deficits. Stable motor Psych: cooperative and appropriate. Flat.   Assessment/Plan: 1. 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 Physiatrist is providing close team supervision and 24 hour management of active medical problems listed below. Physiatrist and rehab team continue to assess barriers to discharge/monitor patient progress toward functional and medical goals.  Function:  Bathing Bathing position   Position: Bed  Bathing parts Body parts bathed by patient: Chest, Abdomen Body parts bathed by helper: Right arm, Left arm, Buttocks, Right upper leg, Left upper leg, Right lower leg, Left lower leg, Back  Bathing assist Assist Level: 2 helpers      Upper Body Dressing/Undressing Upper body dressing   What is the patient wearing?: Pull over shirt/dress     Pull over shirt/dress - Perfomed by patient: Thread/unthread left sleeve Pull over shirt/dress - Perfomed by helper: Thread/unthread right sleeve, Thread/unthread left sleeve, Put head through opening, Pull shirt over trunk        Upper body assist Assist Level: (total assist)      Lower Body Dressing/Undressing Lower body dressing   What is the patient wearing?: Pants, Non-skid slipper socks       Pants- Performed by helper: Thread/unthread right pants leg, Thread/unthread left pants leg, Pull pants up/down Non-skid slipper socks- Performed by patient: Don/doff right sock, Don/doff left sock                    Lower body assist Assist for lower body dressing: (Total A)      Toileting Toileting   Toileting steps completed by patient: Adjust clothing prior to toileting, Adjust clothing after toileting Toileting steps completed  by helper: Adjust clothing prior to toileting, Performs perineal hygiene, Adjust clothing after toileting    Toileting assist Assist level: Two helpers   Transfers Chair/bed transfer   Chair/bed transfer method: Lateral scoot Chair/bed transfer assist level: Moderate assist (Pt 50 - 74%/lift or  lower) Chair/bed transfer assistive device: Armrests, Sliding board Mechanical lift: Teacher, music activity did not occur: Safety/medical concerns         Wheelchair   Type: Manual Max wheelchair distance: 50 Assist Level: Touching or steadying assistance (Pt > 75%)  Cognition Comprehension Comprehension assist level: Understands basic 90% of the time/cues < 10% of the time  Expression Expression assist level: Expresses basic 90% of the time/requires cueing < 10% of the time.  Social Interaction Social Interaction assist level: Interacts appropriately 90% of the time - Needs monitoring or encouragement for participation or interaction.  Problem Solving Problem solving assist level: Solves basic 75 - 89% of the time/requires cueing 10 - 24% of the time  Memory Memory assist level: Recognizes or recalls 75 - 89% of the time/requires cueing 10 - 24% of the time   Medical Problem List and Plan: 1. Decreased functional mobility with cognitive deficitssecondary to TBI/polytrauma 02/07/2017, additional deconditioning due to prolonged hospital stay -continue PT, OT SLP 2. DVT Prophylaxis/Anticoagulation: Subcutaneous Lovenox. Check vascular study pending 3. Pain Management: Oxycodone 5 mg every 6 hours as needed -fair control 4. Mood: Clonazepam 0.5 mg daily at bedtime, trazodone 25 mg daily at bedtime, Seroquel 100 mg daily at bedtime, Zoloft 50 mg daily, amantadine 100 mg daily, Provigil 100 mg daily  Will increase trazodone back to 50mg   -check sleep chart 5. Neuropsych: This patient iscapable of making decisions on hisown behalf. 6. Skin/Wound Care: Routine skin checks  -  air mattress due to sensitive skin    7. Fluids/Electrolytes/Nutrition: Routine I&O with follow-up chemistries 8.Nondisplaced fractures of right T7 transverse process. Conservative care 9.Multiple pelvic fractures/bilateral sacroiliac joints and suprapubic  symphysis as well as left pelvic sidewall hematoma. Status post sacroiliac screw fixation with external fixation pelvis anterior pelvic ring closed reduction 02/07/2017. May ambulate extended distances weightbearing as tolerated without restrictions. 10.Seizure prophylaxis. Keppra 500 mg every 12 hours. EEG negative 11.Tracheostomy tube 03/01/2017 per Dr. Hulen Skains. Decannulated. Continue occlusive dressing---needs to be over hole 12.Gastrostomy tube 03/01/2017 per Dr. Hulen Skains. Presently not receiving tube feeds. Diet advanced to mechanical soft and liquids.  -pt eating enough  -can probably remove peg later this week. 13.Urinary retention/urethral injury. Status post suprapubic catheter placement 03/21/2017 per interventional radiology. Follow-up urology services Dr. Louis Meckel  -will check on duration next week 14.Acute blood loss anemia. Follow-up CBC. Continue iron supplement 15. DM 2: SSI, CBG's TID and HS  -on CM diet  -added amaryl 10/29---increased to 2mg  10/31 with improved control 16. Tachycardia  -hx pac's. Likely due to debility   -improved with increased metoprolol  LOS (Days) 9 A FACE TO FACE EVALUATION WAS PERFORMED  [name], MD 04/24/2017 8:38 AM

## 2017-04-24 NOTE — Progress Notes (Signed)
Physical Therapy Weekly Progress Note  Patient Details  Name: Peter [name] MRN: [id number] Date of Birth: 08-30-1960  Beginning of progress report period: April 16, 2017 End of progress report period: April 24, 2017  Today's Date: 04/24/2017 PT Individual Time: 1315-1415 PT Individual Time Calculation (min): 60 min   Patient has met 2 of 4 short term goals with progress towards remaining goals. Currently requires modA for bed mobility, modA for transfers with slideboard, maxA sit <>stand with eval walker and stedy. Pt limited by severe deconditioning, muscular atrophy and incoordination in BLEs, decreased BP regulation with upright activities, and fatigue. Pt is motivated to work towards goals and improve mobility and independence. Various family present for most sessions for ongoing education, and has begun performing LE ROM HEP to facilitate progression towards goals.   Patient continues to demonstrate the following deficits muscle weakness and muscle joint tightness, decreased cardiorespiratoy endurance, impaired timing and sequencing, unbalanced muscle activation and decreased coordination and decreased sitting balance, decreased standing balance, decreased postural control and decreased balance strategies and therefore will continue to benefit from skilled PT intervention to increase functional independence with mobility.  Patient progressing toward long term goals..  Continue plan of care.  PT Short Term Goals Week 1:  PT Short Term Goal 1 (Week 1): Pt will transfer from bed<>w/c with mod assist PT Short Term Goal 1 - Progress (Week 1): Met PT Short Term Goal 2 (Week 1): Pt will perform sit<>stand with max assist +1 PT Short Term Goal 2 - Progress (Week 1): Met PT Short Term Goal 3 (Week 1): Pt will initiate gait training PT Short Term Goal 3 - Progress (Week 1): Not met PT Short Term Goal 4 (Week 1): Pt will propel w/c x 41f with supervision PT Short Term Goal 4 - Progress  (Week 1): Progressing toward goal Week 2:  PT Short Term Goal 1 (Week 2): Pt will perform bed mobility with minA PT Short Term Goal 2 (Week 2): Pt will perform transfers with consistent minA using slideboard PT Short Term Goal 3 (Week 2): Pt will initiate gait training PT Short Term Goal 4 (Week 2): Pt will propel w/c x75' with S  Skilled Therapeutic Interventions/Progress Updates: Pt received seated in w/c finishing lunch with family present; requests to finish eating before participating. W/c propulsion x50' with minA to maintain straight trajectory d/t veering R. Slideboard transfer w/c <>mat table at level height with minA, cues for increased push through LEs and head/hips relationship. Sit <>stnad x3 trials with eva walker from elevated mat table; maxA and pt unable to demonstrate glute activation for upright posture and trunk extension. Sit<>supine modA. Performed 2 sets 10 reps bridging, first set AROM, second set AAROM to facilitate normalized movement pattern. Sidelying hip abduction/ER clamshell AAROM. Attempted sit <>stand with standard walker, unable to complete with maxA. Two additional trials with eva walker sit <>stand with maxA. Standing frame x5 min while performing heel raises, quad sets, glute sets. Returned to bed [employer] Partstransfer. ModA sit >supine. Remained supine in bed with handoff to RN at completion of session, all needs in reach.      Therapy Documentation Precautions:  Precautions Precautions: Fall Precaution Comments: trach, PEG, catheter  Required Braces or Orthoses: Other Brace/Splint Other Brace/Splint: bil PRAFO, abdominal binder, bil hand splints at night Restrictions Weight Bearing Restrictions: No RLE Weight Bearing: Weight bearing as tolerated LLE Weight Bearing: Weight bearing as tolerated Other Position/Activity Restrictions: no ROM restrictions bil LE General: PT Amount  of Missed Time (min): 15 Minutes PT Missed Treatment Reason: Other  (Comment)(late lunch tray) Pain: Pain Assessment Pain Assessment: No/denies pain   See Function Navigator for Current Functional Status.  Therapy/Group: Individual Therapy  Luberta Mutter 04/24/2017, 2:21 PM

## 2017-04-25 ENCOUNTER — Other Ambulatory Visit: Payer: Self-pay

## 2017-04-25 ENCOUNTER — Inpatient Hospital Stay (HOSPITAL_COMMUNITY): Payer: BLUE CROSS/BLUE SHIELD

## 2017-04-25 LAB — GLUCOSE, CAPILLARY
GLUCOSE-CAPILLARY: 112 mg/dL — AB (ref 65–99)
GLUCOSE-CAPILLARY: 117 mg/dL — AB (ref 65–99)
Glucose-Capillary: 136 mg/dL — ABNORMAL HIGH (ref 65–99)
Glucose-Capillary: 153 mg/dL — ABNORMAL HIGH (ref 65–99)

## 2017-04-25 MED ORDER — CLONAZEPAM 0.5 MG PO TABS
1.0000 mg | ORAL_TABLET | Freq: Every day | ORAL | Status: DC
  Administered 2017-04-25 – 2017-04-30 (×6): 1 mg via ORAL
  Filled 2017-04-25 (×6): qty 2

## 2017-04-25 MED ORDER — ACETAMINOPHEN 325 MG PO TABS
650.0000 mg | ORAL_TABLET | Freq: Four times a day (QID) | ORAL | Status: DC | PRN
  Administered 2017-04-25 – 2017-05-10 (×6): 650 mg via ORAL
  Filled 2017-04-25 (×6): qty 2

## 2017-04-25 MED ORDER — QUETIAPINE FUMARATE 100 MG PO TABS
100.0000 mg | ORAL_TABLET | Freq: Every day | ORAL | Status: DC
  Administered 2017-04-25 – 2017-05-13 (×19): 100 mg via ORAL
  Filled 2017-04-25 (×19): qty 1

## 2017-04-25 MED ORDER — TRAZODONE HCL 50 MG PO TABS
50.0000 mg | ORAL_TABLET | Freq: Every day | ORAL | Status: DC
  Administered 2017-04-25 – 2017-05-18 (×24): 50 mg via ORAL
  Filled 2017-04-25 (×24): qty 1

## 2017-04-25 NOTE — Progress Notes (Signed)
Speech Language Pathology Daily Session Note  Patient Details  Name: Peter [name] MRN: [id number] Date of Birth: April 16, 1961  Today's Date: 04/25/2017 SLP Individual Time: 1000-1055 SLP Individual Time Calculation (min): 55 min  Short Term Goals: Week 2: SLP Short Term Goal 1 (Week 2): Pt will utilize external memory aids to recall new daily information with supervision cues.  SLP Short Term Goal 2 (Week 2): Pt will selective attention for ~ 30 minutes with Min A cues.  SLP Short Term Goal 3 (Week 2): Pt will demonstrate intellectual awarness by identifying 3 physcial and 3 cognitive deficits with Min A cues.  SLP Short Term Goal 4 (Week 2): Pt will complete mildly complex problems with Min A cues.   Skilled Therapeutic Interventions: Skilled ST services focused on cognitive skills. Pt was seated in WC upon entering room and express pain in goring area, request to lay in bed soon,SLP notified nursing staff. SLP facilitated money management utilizing making change with cash and coins and balancing a checkbook ( pt pays via checkbook and debit card on most occasions) pt demonstrated ability to preform complex problem solving skills at Mod I level with assistance in recording answers and utilizing calculator. SLP reviewed current medication list ( left in room), however pt requested to get in bed. SLP assistance transfer into bed with nurse tech and nurse. Pt was left in room with nursing staff and call bell within reach.Reccomend to continue plan of care.     Function:  Eating Eating                 Cognition Comprehension Comprehension assist level: Follows basic conversation/direction with no assist  Expression   Expression assist level: Expresses basic needs/ideas: With no assist  Social Interaction Social Interaction assist level: Interacts appropriately with others with medication or extra time (anti-anxiety, antidepressant).  Problem Solving Problem solving assist level:  Solves complex problems: With extra time  Memory Memory assist level: Recognizes or recalls 75 - 89% of the time/requires cueing 10 - 24% of the time    Pain Pain Assessment Pain Assessment: Faces Faces Pain Scale: Hurts even more Pain Type: Acute pain Pain Location: Groin Pain Descriptors / Indicators: Discomfort Pain Intervention(s): Medication (See eMAR)  Therapy/Group: Individual Therapy  Aeon Kessner  [facility] 04/25/2017, 12:23 PM

## 2017-04-25 NOTE — Progress Notes (Signed)
[facility] PHYSICAL MEDICINE & REHABILITATION     PROGRESS NOTE    Subjective/Complaints: States he did not sleep that much although sleep chart says otherwise.  Denies having bowel movements but to bowel movements recorded after sorbitol.  ROS: pt denies nausea, vomiting, diarrhea, cough, shortness of breath or chest pain    Objective: Vital Signs: Blood pressure 131/77, pulse (!) 104, temperature 98.3 F (36.8 C), temperature source Oral, resp. rate 18, height 5\' 10"  (1.778 m), weight 68 kg (149 lb 14.6 oz), SpO2 100 %. No results found. No results for input(s): WBC, HGB, HCT, PLT in the last 72 hours. No results for input(s): NA, K, CL, GLUCOSE, BUN, CREATININE, CALCIUM in the last 72 hours.  Invalid input(s): CO CBG (last 3)  Recent Labs    04/24/17 1623 04/24/17 2052 04/25/17 0614  GLUCAP 116* 126* 136*    Wt Readings from Last 3 Encounters:  04/19/17 68 kg (149 lb 14.6 oz)  03/23/17 74.4 kg (164 lb 0.4 oz)    Physical Exam:  Constitutional: frail appearing.  HENT:  Head: Normocephalicand atraumatic.  Eyes: Pupils are equal, round, and reactive to light. EOMare normal.  Neck: Normal range of motion. Neck supple. No thyromegalypresent.  Trach stoma closed,granulating in. Air leaking through stoma Cardiovascular: IRR rate controlled Respiratory: CTA Bilaterally without wheezes or rales. Normal effort  GI: Belly is less distended.  Bowel sounds positive nontender Gastrostomy tube in place  Skin. Warm and dry, thin Musculoskeletal. Patient with +1 edema to the right hand. Palpable pedal pulses lower extremities Genitourinary. Suprapubic tube in place and functioning Neurological. Awake and alert. Limited insight and awareness. Follows simple commands.  Oriented to place, person, month, reason he's here. UE 3+/5 Deltoid, biceps, triceps and 4- HI. LE: 2 to 2+/5 HF, KEand 3+ ADF/PF. No sensory deficits.  Minimal motor changes today Psych: Affect remains flat.    Assessment/Plan: 1. 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 Physiatrist is providing close team supervision and 24 hour management of active medical problems listed below. Physiatrist and rehab team continue to assess barriers to discharge/monitor patient progress toward functional and medical goals.  Function:  Bathing Bathing position   Position: Bed  Bathing parts Body parts bathed by patient: Chest, Abdomen Body parts bathed by helper: Right arm, Left arm, Buttocks, Right upper leg, Left upper leg, Right lower leg, Left lower leg, Back  Bathing assist Assist Level: 2 helpers      Upper Body Dressing/Undressing Upper body dressing   What is the patient wearing?: Pull over shirt/dress     Pull over shirt/dress - Perfomed by patient: Thread/unthread left sleeve Pull over shirt/dress - Perfomed by helper: Thread/unthread right sleeve, Thread/unthread left sleeve, Put head through opening, Pull shirt over trunk        Upper body assist Assist Level: (total assist)      Lower Body Dressing/Undressing Lower body dressing   What is the patient wearing?: Pants, Non-skid slipper socks       Pants- Performed by helper: Thread/unthread right pants leg, Thread/unthread left pants leg, Pull pants up/down Non-skid slipper socks- Performed by patient: Don/doff right sock, Don/doff left sock Non-skid slipper socks- Performed by helper: Don/doff right sock, Don/doff left sock                  Lower body assist Assist for lower body dressing: (Total A)  Toileting Toileting   Toileting steps completed by patient: Adjust clothing prior to toileting, Adjust clothing after toileting Toileting steps completed by helper: Adjust clothing prior to toileting, Performs perineal hygiene, Adjust clothing after toileting    Toileting assist Assist  level: Two helpers   Transfers Chair/bed transfer   Chair/bed transfer method: Lateral scoot Chair/bed transfer assist level: Moderate assist (Pt 50 - 74%/lift or lower) Chair/bed transfer assistive device: Armrests, Sliding board Mechanical lift: Teacher, music activity did not occur: Safety/medical concerns         Wheelchair   Type: Manual Max wheelchair distance: 50 Assist Level: Touching or steadying assistance (Pt > 75%)  Cognition Comprehension Comprehension assist level: Understands basic 90% of the time/cues < 10% of the time  Expression Expression assist level: Expresses basic 90% of the time/requires cueing < 10% of the time.  Social Interaction Social Interaction assist level: Interacts appropriately 90% of the time - Needs monitoring or encouragement for participation or interaction.  Problem Solving Problem solving assist level: Solves basic 75 - 89% of the time/requires cueing 10 - 24% of the time  Memory Memory assist level: Recognizes or recalls 75 - 89% of the time/requires cueing 10 - 24% of the time   Medical Problem List and Plan: 1. Decreased functional mobility with cognitive deficitssecondary to TBI/polytrauma 02/07/2017, additional deconditioning due to prolonged hospital stay -continue PT, OT SLP  -team conf today 2. DVT Prophylaxis/Anticoagulation: Subcutaneous Lovenox. Check vascular study pending 3. Pain Management: Oxycodone 5 mg every 6 hours as needed -fair control 4. Mood: Clonazepam 0.5 mg daily at bedtime, trazodone 50 mg daily at bedtime, Seroquel 100 mg daily at bedtime, Zoloft 50 mg daily, amantadine 100 mg daily, Provigil 100 mg daily   -continue sleep chart 5. Neuropsych: This patient iscapable of making decisions on hisown behalf. 6. Skin/Wound Care: Routine skin checks  - continue air mattress due to sensitive skin    7. Fluids/Electrolytes/Nutrition: Routine I&O with follow-up  chemistries 8.Nondisplaced fractures of right T7 transverse process. Conservative care 9.Multiple pelvic fractures/bilateral sacroiliac joints and suprapubic symphysis as well as left pelvic sidewall hematoma. Status post sacroiliac screw fixation with external fixation pelvis anterior pelvic ring closed reduction 02/07/2017. May ambulate extended distances weightbearing as tolerated without restrictions. 10.Seizure prophylaxis. Keppra 500 mg every 12 hours. EEG negative 11.Tracheostomy tube 03/01/2017 per Dr. Hulen Skains. Decannulated.   -Need to try pink tape in more compressive dressing to stoma to prevent air leakage 12.Gastrostomy tube 03/01/2017 per Dr. Hulen Skains. Presently not receiving tube feeds. Diet advanced to mechanical soft and liquids.  -pt eating enough  -We will remove PEG on Thursday 13.Urinary retention/urethral injury. Status post suprapubic catheter placement 03/21/2017 per interventional radiology. Follow-up urology services Dr. Louis Meckel  -We will recheck out to urology regarding duration tube sometime over the next week or so 14.Acute blood loss anemia. Follow-up CBC. Continue iron supplement 15. DM 2: SSI, CBG's TID and HS  -on CM diet  -Continue Amaryl 2 mg daily.  Sugars under control at present  16. Tachycardia  - Likely due to debility   -improved with increased metoprolol  LOS (Days) 10 A FACE TO FACE EVALUATION WAS PERFORMED  [name], MD 04/25/2017 8:36 AM

## 2017-04-25 NOTE — Progress Notes (Signed)
Occupational Therapy Session Note  Patient Details  Name: Peter [name] MRN: [id number] Date of Birth: [birth year]  Today's Date: 04/25/2017 OT Individual Time: 0900-1000 OT Individual Time Calculation (min): 60 min    Short Term Goals: Week 1:  OT Short Term Goal 1 (Week 1): Pt will complete self feeding with Mod A and AE PRN OT Short Term Goal 2 (Week 1): Pt will complete LB dressing at sit<stand level with LRAD OT Short Term Goal 3 (Week 1): Pt will BSC transfer with 1 helper and LRAD  Skilled Therapeutic Interventions/Progress Updates:    Pt resting in bed upon arrival.  Focus on bed mobility, sitting balance, functional transfers with slide board, and grooming tasks w/c level at sink.  Pt required mod A for supine>sit EOB.  Pt sat EOB at supervision level.  Pt required min A for slide board transfer to w/c.  Pt c/o abdominal discomfort sitting in w/c.  Suprapubic catheter checked for kinks and pt reported some relief although still uncomfortable.  Pt agreed to remain in w/c through next therapy session.  Pt completed shaving with electric shaver with min A.  Focus transitioned to BUE PROM and AAROM seated in w/c.  Pt remained seated in w/c with all neeeds within reach and sister present.  Therapy Documentation Precautions:  Precautions Precautions: Fall Precaution Comments: trach, PEG, catheter  Required Braces or Orthoses: Other Brace/Splint Other Brace/Splint: bil PRAFO, abdominal binder, bil hand splints at night Restrictions Weight Bearing Restrictions: Yes RLE Weight Bearing: Weight bearing as tolerated LLE Weight Bearing: Weight bearing as tolerated Other Position/Activity Restrictions: no ROM restrictions bil LE   Pain: Pt c/o abdominal pain and muscle spasms in groin; repositioned and RN aware  See Function Navigator for Current Functional Status.   Therapy/Group: Individual Therapy  [name] 04/25/2017, 10:21 AM

## 2017-04-25 NOTE — Progress Notes (Signed)
Physical Therapy Session Note  Patient Details  Name: Peter [name] MRN: [id number] Date of Birth: March 10, 1961  Today's Date: 04/25/2017 PT Individual Time: 1400-1500 PT Individual Time Calculation (min): 60 min   Short Term Goals: Week 2:  PT Short Term Goal 1 (Week 2): Pt will perform bed mobility with minA PT Short Term Goal 2 (Week 2): Pt will perform transfers with consistent minA using slideboard PT Short Term Goal 3 (Week 2): Pt will initiate gait training PT Short Term Goal 4 (Week 2): Pt will propel w/c x75' with S  Skilled Therapeutic Interventions/Progress Updates:    Pt supine in bed upon PT arrival, agreeable to therapy tx and denies pain. Pt and sister report that the pt is waiting on getting his catheter replaced. Pt reports that earlier this AM it was causing discomfort in sitting and requested to perform bed exercises this session. Pt performed 2 x 10 contract relax PNF D2 extension with manual resistance for LE NMR. Pt performed therex: 2 x 10 heel slides, 2 x 10 SAQ, 2 x 10 assisted bridges, hip abd/adduction in hooklying 1 x 10. Therapist performed manual stretches 2 x 30 sec each: hamstring, gastroc, hip IR/ER's. Pt able to scoot himself up in bed with therapist holding feet in hooklying to prevent sliding, min assist. Pt left supine in bed at end of session with needs in reach.   Therapy Documentation Precautions:  Precautions Precautions: Fall Precaution Comments: trach, PEG, catheter  Required Braces or Orthoses: Other Brace/Splint Other Brace/Splint: bil PRAFO, abdominal binder, bil hand splints at night Restrictions Weight Bearing Restrictions: Yes RLE Weight Bearing: Weight bearing as tolerated LLE Weight Bearing: Weight bearing as tolerated Other Position/Activity Restrictions: no ROM restrictions bil LE   See Function Navigator for Current Functional Status.   Therapy/Group: Individual Therapy  Netta Corrigan, PT, DPT 04/25/2017, 8:01 AM

## 2017-04-25 NOTE — Progress Notes (Signed)
Urine specimen from suprapubic catheter sent to lab for urine culture and urine analysis with lab notifying RN that specimen wasn't able to be tested. Urine was to thick and unreadable for the machine. RN notified Silvestre Mesi. PA with finding. Radiology to assess suprapubic cath for need of exchange and new tubing. Urine thick, foul smell present, sediment and mucus like texture.

## 2017-04-25 NOTE — Progress Notes (Signed)
Occupational Therapy Note  Patient Details  Name: Peter [name] MRN: [id number] Date of Birth: 1961-03-26  Today's Date: 04/25/2017 OT Individual Time: 1330-1400 OT Individual Time Calculation (min): 30 min   Pt denied pain Individual therapy  Pt resting in bed with sister present.  Focus on BUE PROM and BUE therex with 2# cuff weights.  Pt's sister educated on therex and donning/doffing cuff weights.  Pt remained in bed with all needs within reach and sister present.  [name] [facility] 04/25/2017, 2:39 PM

## 2017-04-25 NOTE — Plan of Care (Signed)
Pt's skin integrity improving with continued treatments/dressings. Pt taking sorbitol as needed for constipation.

## 2017-04-26 ENCOUNTER — Inpatient Hospital Stay (HOSPITAL_COMMUNITY): Payer: BLUE CROSS/BLUE SHIELD

## 2017-04-26 ENCOUNTER — Inpatient Hospital Stay (HOSPITAL_COMMUNITY): Payer: BLUE CROSS/BLUE SHIELD | Admitting: Physical Therapy

## 2017-04-26 DIAGNOSIS — R339 Retention of urine, unspecified: Secondary | ICD-10-CM

## 2017-04-26 HISTORY — PX: IR CATHETER TUBE CHANGE: IMG717

## 2017-04-26 LAB — GLUCOSE, CAPILLARY
GLUCOSE-CAPILLARY: 151 mg/dL — AB (ref 65–99)
GLUCOSE-CAPILLARY: 71 mg/dL (ref 65–99)
Glucose-Capillary: 163 mg/dL — ABNORMAL HIGH (ref 65–99)

## 2017-04-26 MED ORDER — SULFAMETHOXAZOLE-TRIMETHOPRIM 800-160 MG PO TABS
1.0000 | ORAL_TABLET | Freq: Once | ORAL | Status: AC
  Administered 2017-04-26: 1 via ORAL
  Filled 2017-04-26: qty 1

## 2017-04-26 MED ORDER — INSULIN ASPART 100 UNIT/ML ~~LOC~~ SOLN: 1.0000 [IU] | Freq: Every day | SUBCUTANEOUS | Status: DC

## 2017-04-26 MED ORDER — SULFAMETHOXAZOLE-TRIMETHOPRIM 800-160 MG PO TABS
1.0000 | ORAL_TABLET | Freq: Two times a day (BID) | ORAL | Status: DC
  Administered 2017-04-26 – 2017-05-02 (×13): 1 via ORAL
  Filled 2017-04-26 (×14): qty 1

## 2017-04-26 MED ORDER — METOCLOPRAMIDE HCL 5 MG/ML IJ SOLN
10.0000 mg | Freq: Four times a day (QID) | INTRAMUSCULAR | Status: DC
  Administered 2017-04-26 – 2017-04-30 (×15): 10 mg via INTRAVENOUS
  Filled 2017-04-26 (×14): qty 2

## 2017-04-26 MED ORDER — INSULIN ASPART 100 UNIT/ML ~~LOC~~ SOLN: 1.0000 [IU] | Freq: Three times a day (TID) | SUBCUTANEOUS | Status: DC

## 2017-04-26 MED ORDER — IOPAMIDOL (ISOVUE-300) INJECTION 61%
INTRAVENOUS | Status: AC
Start: 2017-04-26 — End: 2017-04-26
  Administered 2017-04-26: 10 mL
  Filled 2017-04-26: qty 50

## 2017-04-26 MED ORDER — LIDOCAINE HCL (PF) 1 % IJ SOLN
INTRAMUSCULAR | Status: DC | PRN
  Administered 2017-04-26: 8 mL

## 2017-04-26 MED ORDER — LIDOCAINE HCL 1 % IJ SOLN
INTRAMUSCULAR | Status: AC
  Filled 2017-04-26: qty 20

## 2017-04-26 MED ORDER — INSULIN ASPART 100 UNIT/ML ~~LOC~~ SOLN
0.0000 [IU] | Freq: Every day | SUBCUTANEOUS | Status: DC
  Administered 2017-04-29 – 2017-05-07 (×4): 3 [IU] via SUBCUTANEOUS

## 2017-04-26 MED ORDER — INSULIN ASPART 100 UNIT/ML ~~LOC~~ SOLN
0.0000 [IU] | Freq: Three times a day (TID) | SUBCUTANEOUS | Status: DC
  Administered 2017-04-30: 3 [IU] via SUBCUTANEOUS
  Administered 2017-05-06: 5 [IU] via SUBCUTANEOUS
  Administered 2017-05-08 – 2017-05-18 (×3): 3 [IU] via SUBCUTANEOUS

## 2017-04-26 MED ORDER — ONDANSETRON HCL 4 MG/2ML IJ SOLN
4.0000 mg | Freq: Four times a day (QID) | INTRAMUSCULAR | Status: DC | PRN
  Administered 2017-04-26: 4 mg via INTRAMUSCULAR
  Filled 2017-04-26: qty 2

## 2017-04-26 NOTE — Progress Notes (Signed)
Transported via bed to procedure. Linna Hoff, [place] notified of vomited. No new orders.

## 2017-04-26 NOTE — Progress Notes (Signed)
Social Work Patient ID: Peter [name], male   DOB: 1960/07/20, 56 y.o.   MRN: [id number]   CSW spoke with pt and pt's dtr and [name] and then later with his wife via telephone to update them on team conference discussion and targeted d/c date of 05-10-17.  Pt's wife was pleased, but was concerned that pt missed therapy due to procedure needed for catheter.  CSW explained that the therapists keep track of that and will make up missed time.  Also, team will continue to monitor pt's progress over the week and discuss it at next week's team conference and d/c date adjustments can be made at that time, as needed.  She expressed understanding.  Pt's current plan is to go to his mother-in-law's home at d/c where he and his wife can be on the first level and have everything they need there.  Wife explained that church will build pt a ramp at home if he needs one when they move back to their home.  CSW will continue to follow and assist as needed.

## 2017-04-26 NOTE — Progress Notes (Signed)
Urine study culture returned > 100,000 Proteus Mirabilis with sensitivities pending.  Bactrim initiated and await final sensitivities

## 2017-04-26 NOTE — Progress Notes (Signed)
PHYSICAL MEDICINE & REHABILITATION     PROGRESS NOTE    Subjective/Complaints: Slept ok. Belly distended.   ROS: pt denies nausea, vomiting, diarrhea, cough, shortness of breath or chest pain    Objective: Vital Signs: Blood pressure 121/74, pulse (!) 102, temperature 98.4 F (36.9 C), temperature source Oral, resp. rate 18, height 5\' 10"  (1.778 m), weight 68 kg (149 lb 14.6 oz), SpO2 99 %. No results found. No results for input(s): WBC, HGB, HCT, PLT in the last 72 hours. No results for input(s): NA, K, CL, GLUCOSE, BUN, CREATININE, CALCIUM in the last 72 hours.  Invalid input(s): CO CBG (last 3)  Recent Labs    04/25/17 1644 04/25/17 2052 04/26/17 0637  GLUCAP 117* 153* 163*    Wt Readings from Last 3 Encounters:  04/19/17 68 kg (149 lb 14.6 oz)  03/23/17 74.4 kg (164 lb 0.4 oz)    Physical Exam:  Constitutional: frail appearing. Slow to arouse HENT:  Head: Normocephalicand atraumatic.  Eyes: Pupils are equal, round, and reactive to light. EOMare normal.  Neck: Normal range of motion. Neck supple. No thyromegalypresent.  Trach stoma closed,granulating in. Air leaking through stoma Cardiovascular: rate controlled Respiratory: CTA Bilaterally without wheezes or rales. Normal effort  GI: Belly is more distended. Not tender. Bowel sounds scarce Gastrostomy tube in place, no drainage  Skin. Warm and dry, thin Musculoskeletal. Patient with +1 edema to the right hand. Palpable pedal pulses lower extremities Genitourinary. Suprapubic tube in place. Sediment on walls of tubing/bag Neurological. Awake and alert. Limited insight and awareness. Follows simple commands.  Oriented to place, person, month, reason he's here. UE 3+/5 Deltoid, biceps, triceps and 4- HI. LE: 2 to 2+/5 HF, KEand 3+ ADF/PF. No sensory deficit. Motor exam stable Psych: Affect remains flat.   Assessment/Plan: 1. 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 Physiatrist is providing close team supervision and 24 hour management of active medical problems listed below. Physiatrist and rehab team continue to assess barriers to discharge/monitor patient progress toward functional and medical goals.  Function:  Bathing Bathing position   Position: Bed  Bathing parts Body parts bathed by patient: Chest, Abdomen Body parts bathed by helper: Right arm, Left arm, Buttocks, Right upper leg, Left upper leg, Right lower leg, Left lower leg, Back  Bathing assist Assist Level: (total assist, night bath by nursing)      Upper Body Dressing/Undressing Upper body dressing   What is the patient wearing?: Pull over shirt/dress     Pull over shirt/dress - Perfomed by patient: Thread/unthread left sleeve Pull over shirt/dress - Perfomed by helper: Thread/unthread right sleeve, Thread/unthread left sleeve, Put head through opening, Pull shirt over trunk        Upper body assist Assist Level: (total assist)      Lower Body Dressing/Undressing Lower body dressing   What is the patient wearing?: Pants, Non-skid slipper socks       Pants- Performed by helper: Thread/unthread right pants leg, Thread/unthread left pants leg, Pull pants up/down Non-skid slipper socks- Performed by patient: Don/doff right sock, Don/doff left sock Non-skid slipper socks- Performed by helper: Don/doff right sock, Don/doff left sock                  Lower body assist Assist for lower body dressing: (Total A)      Toileting Toileting   Toileting steps completed by patient: Adjust  clothing prior to toileting, Adjust clothing after toileting Toileting steps completed by helper: Adjust clothing prior to toileting, Performs perineal hygiene, Adjust clothing after toileting    Toileting assist Assist level: Two helpers   Transfers Chair/bed transfer   Chair/bed  transfer method: Lateral scoot Chair/bed transfer assist level: Moderate assist (Pt 50 - 74%/lift or lower) Chair/bed transfer assistive device: Armrests, Sliding board Mechanical lift: Teacher, music activity did not occur: Safety/medical concerns         Wheelchair   Type: Manual Max wheelchair distance: 50 Assist Level: Touching or steadying assistance (Pt > 75%)  Cognition Comprehension Comprehension assist level: Follows basic conversation/direction with no assist  Expression Expression assist level: Expresses basic needs/ideas: With no assist  Social Interaction Social Interaction assist level: Interacts appropriately with others with medication or extra time (anti-anxiety, antidepressant).  Problem Solving Problem solving assist level: Solves complex problems: With extra time  Memory Memory assist level: Recognizes or recalls 75 - 89% of the time/requires cueing 10 - 24% of the time   Medical Problem List and Plan: 1. Decreased functional mobility with cognitive deficitssecondary to TBI/polytrauma 02/07/2017, additional deconditioning due to prolonged hospital stay -continue PT, OT SLP  -team conf today 2. DVT Prophylaxis/Anticoagulation: Subcutaneous Lovenox. Check vascular study pending 3. Pain Management: Oxycodone 5 mg every 6 hours as needed -fair control 4. Mood: Clonazepam 0.5 mg daily at bedtime, trazodone 50 mg daily at bedtime, Seroquel 100 mg daily at bedtime, Zoloft 50 mg daily, amantadine 100 mg daily, Provigil 100 mg daily   -continue sleep chart 5. Neuropsych: This patient iscapable of making decisions on hisown behalf. 6. Skin/Wound Care: Routine skin checks  - continue air mattress due to sensitive skin    7. Fluids/Electrolytes/Nutrition: Routine I&O with follow-up chemistries 8.Nondisplaced fractures of right T7 transverse process. Conservative care 9.Multiple pelvic fractures/bilateral sacroiliac  joints and suprapubic symphysis as well as left pelvic sidewall hematoma. Status post sacroiliac screw fixation with external fixation pelvis anterior pelvic ring closed reduction 02/07/2017. May ambulate extended distances weightbearing as tolerated without restrictions. 10.Seizure prophylaxis. Keppra 500 mg every 12 hours. EEG negative 11.Tracheostomy tube 03/01/2017 per Dr. Hulen Skains. Decannulated.   -Need to try pink tape in more compressive dressing to stoma to prevent air leakage 12.Gastrostomy tube 03/01/2017 per Dr. Hulen Skains. Presently not receiving tube feeds. Diet advanced to mechanical soft and liquids.  -belly more distended today. Has been moving bowels somewhat  -recheck kub   -may hold on removal of PEG until belly is softer.  -consider enema today 13.Urinary retention/urethral injury. Status post suprapubic catheter placement 03/21/2017 per interventional radiology. Follow-up urology services Dr. Louis Meckel  -[facility] with sediment  -have asked INR to change out  -UA with multiple bacteria/leukocytes---await culture 14.Acute blood loss anemia. Follow-up CBC. Continue iron supplement 15. DM 2:   -on CM diet  -Continue Amaryl 2 mg daily.  Sugars under control at present   -adjusted SSI to more sensitive level to avoid overtreating 16. Tachycardia  - Likely due to debility   -improved with increased metoprolol  LOS (Days) 11 A FACE TO FACE EVALUATION WAS PERFORMED  Alger Simons T, MD 04/26/2017 8:30 AM

## 2017-04-26 NOTE — Progress Notes (Signed)
Speech Language Pathology Daily Session Note  Patient Details  Name: Peter [name] MRN: [id number] Date of Birth: 1960/09/10  Today's Date: 04/26/2017 SLP Individual Time: 1450-1505 SLP Individual Time Calculation (min): 15 min  Short Term Goals: Week 2: SLP Short Term Goal 1 (Week 2): Pt will utilize external memory aids to recall new daily information with supervision cues.  SLP Short Term Goal 2 (Week 2): Pt will selective attention for ~ 30 minutes with Min A cues.  SLP Short Term Goal 3 (Week 2): Pt will demonstrate intellectual awarness by identifying 3 physcial and 3 cognitive deficits with Min A cues.  SLP Short Term Goal 4 (Week 2): Pt will complete mildly complex problems with Min A cues.   Skilled Therapeutic Interventions: Skilled ST services focused on cognitive goals.SLP facilitated medication management utilizing current medication list, pt was unable to physically fill pill organizer due to nausea and fatigue, however responded to problem solving questions pertaining to current medication at Mod I level. Pt demonstrated recall of medication purpose and frequency of dosage utilizing visual aid with supervision question cues.  Pt required Min A verbal cues to attend to task, dritffing off to sleep and session was ended early due to nausea and fatigue.Pt was left in room with visitors presents. Recommend to continue plan of care.     Function:  Eating Eating                 Cognition Comprehension Comprehension assist level: Follows basic conversation/direction with no assist  Expression   Expression assist level: Expresses basic needs/ideas: With no assist  Social Interaction Social Interaction assist level: Interacts appropriately with others with medication or extra time (anti-anxiety, antidepressant).  Problem Solving Problem solving assist level: Solves complex problems: With extra time  Memory Memory assist level: Recognizes or recalls 75 - 89% of the  time/requires cueing 10 - 24% of the time    Pain Pain Assessment Pain Assessment: No/denies pain Pain Score: 3  Faces Pain Scale: Hurts a little bit PAINAD (Pain Assessment in Advanced Dementia) Breathing: normal  Therapy/Group: Individual Therapy  [name]  [facility] 04/26/2017, 3:13 PM

## 2017-04-26 NOTE — Progress Notes (Signed)
Physical Therapy Session Note  Patient Details  Name: Peter [name] MRN: [id number] Date of Birth: 1960-11-11  Today's Date: 04/26/2017 PT Individual Time: 2620-3559 PT Individual Time Calculation (min): 30 min   Short Term Goals: Week 2:  PT Short Term Goal 1 (Week 2): Pt will perform bed mobility with minA PT Short Term Goal 2 (Week 2): Pt will perform transfers with consistent minA using slideboard PT Short Term Goal 3 (Week 2): Pt will initiate gait training PT Short Term Goal 4 (Week 2): Pt will propel w/c x75' with S  Skilled Therapeutic Interventions/Progress Updates: Tx 1: Pt received supine in bed after returning from xray; pt reports he vommitted this morning and continues to feel nauseous. Declines participation in therapy at this time. Will continue to follow as schedule allows.   Tx 2: Pt received supine in bed, continues to c/o nausea and "feeling like crap". Recently received an enema and had incontinent bowel movement in brief. Rolling R/L with bedrails and minA to complete hygiene and clothing management totalA by therapist and student RN. Scooting toward [facility] with BLE in hooklying minA to maintain foot position. BLE PROM to glutes, hamstrings, hip IR/ER, gastroc, soleus, hallux flexors. Declined participation in any OOB activity at this time. Remained in bed with all needs in reach and wife present at completion of session.      Therapy Documentation Precautions:  Precautions Precautions: Fall Precaution Comments: trach, PEG, catheter  Required Braces or Orthoses: Other Brace/Splint Other Brace/Splint: bil PRAFO, abdominal binder, bil hand splints at night Restrictions Weight Bearing Restrictions: Yes RLE Weight Bearing: Weight bearing as tolerated LLE Weight Bearing: Weight bearing as tolerated Other Position/Activity Restrictions: no ROM restrictions bil LE General: PT Amount of Missed Time (min): 60 Minutes and 30 min (total 90 min) PT Missed Treatment Reason:  Patient fatigue;Patient ill (Comment) Pain: Pain Assessment Pain Assessment: No/denies pain Pain Score: 0-No pain   See Function Navigator for Current Functional Status.   Therapy/Group: Individual Therapy  Luberta Mutter 04/26/2017, 10:53 AM

## 2017-04-26 NOTE — Progress Notes (Signed)
Occupational Therapy Weekly Progress Note  Patient Details  Name: Peter [name] MRN: [id number] Date of Birth: August 17, 1960  Beginning of progress report period: April 16, 2017 End of progress report period: April 26, 2017  Patient has met 2 of 3 short term goals.  Pt has made steady progress towards STG and LTG since admission.  Pt requires min A for bed mobility and slide board transfers to w/c.  Pt is able to sit EOB at supervision level and perform lateral leans for slide board placement.  Pt participates in BUE therex but continues to exhibit limited BUE AROM.  Family currently assisting pt with BUE stretching.    Patient continues to demonstrate the following deficits: muscle weakness, decreased cardiorespiratoy endurance and decreased sitting balance, decreased standing balance and decreased balance strategies and therefore will continue to benefit from skilled OT intervention to enhance overall performance with BADL and Reduce care partner burden.  Patient progressing toward long term goals..  Continue plan of care.  OT Short Term Goals Week 1:  OT Short Term Goal 1 (Week 1): Pt will complete self feeding with Mod A and AE PRN OT Short Term Goal 1 - Progress (Week 1): Met OT Short Term Goal 2 (Week 1): Pt will complete LB dressing at sit<stand level with LRAD OT Short Term Goal 2 - Progress (Week 1): Progressing toward goal OT Short Term Goal 3 (Week 1): Pt will BSC transfer with 1 helper and LRAD OT Short Term Goal 3 - Progress (Week 1): Met Week 2:  OT Short Term Goal 1 (Week 2): Pt will complete LB dressing at sit<stand level with LRAD OT Short Term Goal 2 (Week 2): Pt will perform squat pivot transfer to [facility] with max A OT Short Term Goal 3 (Week 2): Pt will perform UB dressing tasks with mod A OT Short Term Goal 4 (Week 2): Pt will perform LB dressing tasks with max A sit<>stand.      Therapy Documentation Precautions:  Precautions Precautions: Fall Precaution  Comments: trach, PEG, catheter  Required Braces or Orthoses: Other Brace/Splint Other Brace/Splint: bil PRAFO, abdominal binder, bil hand splints at night Restrictions Weight Bearing Restrictions: Yes RLE Weight Bearing: Weight bearing as tolerated LLE Weight Bearing: Weight bearing as tolerated Other Position/Activity Restrictions: no ROM restrictions bil LE     See Function Navigator for Current Functional Status.    [name] [facility] 04/26/2017, 9:31 AM

## 2017-04-26 NOTE — Progress Notes (Signed)
Patient transported for suprapubic catheter exchange.

## 2017-04-26 NOTE — Progress Notes (Signed)
Occupational Therapy Note  Patient Details  Name: Peter [name] MRN: [id number] Date of Birth: 13-Apr-1961  Today's Date: 04/26/2017 OT Missed Time: 32 Minutes Missed Time Reason: Unavailable (comment)(off unit for medical procedure)  Pt missed 60 mins skilled OT services 2/2 off unit for procedure.    [name] [facility] 04/26/2017, 9:29 AM

## 2017-04-26 NOTE — Procedures (Signed)
Suprapubic catheter exchange 14 Fr EBL 0 Comp 0

## 2017-04-26 NOTE — Patient Care Conference (Signed)
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 Chest trauma   . Fracture   . Multiple rib fractures involving four or more ribs   . Pelvic ring fracture, sequela   . Respiratory failure ([place])   . 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[place])   . Post-operative pain   . Acute blood loss anemia   . Diabetes mellitus type 2 in nonobese (H[facility])   . Hyperlipidemia   . Tachypnea   . Secondary hypertension   . Hyperkalemia   . Pressure injury of skin 02/28/2017  . Multiple closed anterior-posterior compression fractures of pelvis ([place]) 02/08/2017  . Multiple fractures of ribs, bilateral, initial encounter for closed fracture 02/07/2017    Expected Discharge Date: Expected Discharge Date: 05/10/17  Team Members Present: Physician leading conference: Dr. Alger Simons Social Worker Present: Alfonse Alpers, LCSW Nurse Present: Junius Creamer, RN PT Present: Canary Brim, PT OT Present: Roanna Epley, COTA SLP Present: Weston Anna, SLP PPS Coordinator present : Daiva Nakayama, RN, CRRN     Current Status/Progress Goal Weekly Team Focus  Medical   Trach stoma remains open.  PEG in place but patient eating well.  Remains  tachycardic.  Sleep is poor  Improve sleep patterns and daily arousal  Trach care, nutrition, sleep   Bowel/Bladder   bowel incontinence, LBM 04/25/17, c/o feeling constipated still after having first BM on 11/5 & was given sorbitol, also had prune juice last night, refuses suppository, has senna 2 tabs nightly, s/p cath  less episodes of incontinence  monitor for pattern   Swallow/Nutrition/ Hydration             ADL's   UB BADLs-mod A; LB BADLs-tot A; sitting balance-min A; functional transfers (slide board)-max A; limited activity tolerance  min A overall  family educaiton, trunk control, BADL retraining, activity tolernace, BUE strengthening   Mobility   modA bed mobility and transfers with slideboard, maxA sit<>stand; gait NT  min assist w/c level; supervision w/c mobility; mod assist controlled environment gait goal  activity tolerance, LE/core strengthening, transfers, sit <>stand   Communication             Safety/Cognition/ Behavioral Observations  Min- supervision  supervision  Recall, medication managment, awareness  and sustained attention   Pain   no c/o pain, has oxycodone prn, has not used since 11/1  pain scale <4  continue to assess & treat as needed   Skin   multiple wounds (abrasions)to left groin, abdomen, thigh, arm, has wound under penis, xeroform being applied, foam to other areas  no new areas of skin breakdown, no signs of infection  continue to asses q shift    Rehab Goals Patient on target to meet rehab goals: Yes Rehab Goals Revised: none *See Care Plan and progress notes for long and short-term goals.     Barriers to Discharge  Current Status/Progress Possible Resolutions Date Resolved   Physician    Trach;Medical stability        Wound care, PEG removal this week.  Heart rate control      Nursing  Incontinence;Lack of/limited family support;Wound Care               PT                    OT                  SLP                SW                 Discharge Planning/Teaching Needs:  Plan for d/c home with wife and family to provide 24/7 assistance.  Teaching to take place closer to d/c.   Team Discussion:  Per Dr. Naaman Plummer, trach stoma is not quire closed and dressing needs to be changed regularly.  PEG to come out this week.  Pt is deconditioned, he is not sleeping well, and has some cognitive challenges.  Radiology to come look at pt's suprapubic catheter to see what may be wrong with it.  Pt's incontinent of bowel and is taking tylenol for pain.  Pt's skin is fragile.  Pt has been uncomfortable with therapies, likely due to the catheter, but is making progress.  He has some anxiety with transfers.  Family will need a ramp to use at home, as pt will not have gait yet.  ST is working on problem solving, attention, and recall.  Pt with min A goals overall.  Revisions to Treatment Plan:  none    Continued Need for Acute Rehabilitation Level of Care: The patient requires daily medical management by a physician with specialized training in physical medicine and rehabilitation for the following conditions: Daily direction of a multidisciplinary physical rehabilitation program to ensure safe treatment while eliciting the highest outcome that is of practical value to the patient.: Yes Daily medical management of patient stability for increased activity during participation in an intensive rehabilitation regime.: Yes Daily analysis of laboratory values and/or radiology reports with any subsequent need for medication adjustment of medical intervention for : Post surgical problems;Neurological problems  Renold Kozar, Silvestre Mesi 04/26/2017, 10:14 AM

## 2017-04-27 ENCOUNTER — Inpatient Hospital Stay (HOSPITAL_COMMUNITY): Payer: BLUE CROSS/BLUE SHIELD

## 2017-04-27 ENCOUNTER — Other Ambulatory Visit: Payer: Self-pay

## 2017-04-27 ENCOUNTER — Inpatient Hospital Stay (HOSPITAL_COMMUNITY): Payer: BLUE CROSS/BLUE SHIELD | Admitting: Speech Pathology

## 2017-04-27 ENCOUNTER — Inpatient Hospital Stay (HOSPITAL_COMMUNITY): Payer: BLUE CROSS/BLUE SHIELD | Admitting: Occupational Therapy

## 2017-04-27 ENCOUNTER — Inpatient Hospital Stay (HOSPITAL_COMMUNITY): Payer: BLUE CROSS/BLUE SHIELD | Admitting: Physical Therapy

## 2017-04-27 ENCOUNTER — Encounter (HOSPITAL_COMMUNITY): Payer: Self-pay | Admitting: Interventional Radiology

## 2017-04-27 LAB — CBC
HCT: 31.2 % — ABNORMAL LOW (ref 39.0–52.0)
Hemoglobin: 10.3 g/dL — ABNORMAL LOW (ref 13.0–17.0)
MCH: 29.1 pg (ref 26.0–34.0)
MCHC: 33 g/dL (ref 30.0–36.0)
MCV: 88.1 fL (ref 78.0–100.0)
PLATELETS: 416 10*3/uL — AB (ref 150–400)
RBC: 3.54 MIL/uL — AB (ref 4.22–5.81)
RDW: 14.6 % (ref 11.5–15.5)
WBC: 14.8 10*3/uL — AB (ref 4.0–10.5)

## 2017-04-27 LAB — BASIC METABOLIC PANEL
ANION GAP: 9 (ref 5–15)
BUN: 40 mg/dL — ABNORMAL HIGH (ref 6–20)
CALCIUM: 8.3 mg/dL — AB (ref 8.9–10.3)
CO2: 22 mmol/L (ref 22–32)
Chloride: 93 mmol/L — ABNORMAL LOW (ref 101–111)
Creatinine, Ser: 1.04 mg/dL (ref 0.61–1.24)
GFR calc Af Amer: >60 mL/min (ref >60)
GLUCOSE: 138 mg/dL — AB (ref 65–99)
Potassium: 3.6 mmol/L (ref 3.5–5.1)
SODIUM: 124 mmol/L — AB (ref 135–145)

## 2017-04-27 LAB — URINE CULTURE

## 2017-04-27 LAB — GLUCOSE, CAPILLARY
GLUCOSE-CAPILLARY: 93 mg/dL (ref 65–99)
GLUCOSE-CAPILLARY: 80 mg/dL (ref 65–99)
GLUCOSE-CAPILLARY: 101 mg/dL — AB (ref 65–99)
Glucose-Capillary: 23 mg/dL — CL (ref 65–99)
Glucose-Capillary: 86 mg/dL (ref 65–99)

## 2017-04-27 MED ORDER — MAGNESIUM CITRATE PO SOLN
1.0000 | Freq: Once | ORAL | Status: AC
  Administered 2017-04-27: 1 via ORAL
  Filled 2017-04-27: qty 296

## 2017-04-27 MED ORDER — SODIUM CHLORIDE 0.9 % IV SOLN
INTRAVENOUS | Status: DC
  Administered 2017-04-27 – 2017-04-29 (×3): via INTRAVENOUS

## 2017-04-27 MED ORDER — GLUCAGON HCL RDNA (DIAGNOSTIC) 1 MG IJ SOLR: 1.0000 mg | Freq: Once | INTRAMUSCULAR | Status: DC | PRN

## 2017-04-27 MED ORDER — GLUCAGON HCL RDNA (DIAGNOSTIC) 1 MG IJ SOLR
INTRAMUSCULAR | Status: AC
  Administered 2017-04-27: 1 mg
  Filled 2017-04-27: qty 1

## 2017-04-27 NOTE — Progress Notes (Signed)
[place] PHYSICAL MEDICINE & REHABILITATION     PROGRESS NOTE    Subjective/Complaints: Patient able to sleep somewhat last night.  Was hypoglycemic this morning at 643.  Reading was 23.  Level came quickly back to 93 with treatment  ROS: pt denies nausea, vomiting, diarrhea, cough, shortness of breath or chest pain     Objective: Vital Signs: Blood pressure 124/72, pulse 99, temperature (!) 97.5 F (36.4 C), temperature source Oral, resp. rate 16, height 5\' 10"  (1.778 m), weight 68 kg (149 lb 14.6 oz), SpO2 98 %. Dg Abd 1 View  Result Date: 04/26/2017 CLINICAL DATA:  Abdominal distention and constipation. EXAM: ABDOMEN - 1 VIEW COMPARISON:  Abdominal radiograph of April 21, 2017 FINDINGS: There remains enlarged colonic stool burden predominantly in the right and left colon. There is moderate gaseous distention of portions of the colon. There is a small amount of gas within normal caliber small bowel. There is a gastrostomy tube present. There is a pigtail catheter in the pelvis. The patient has undergone previous bony pelvis repair. IMPRESSION: There remain loops of distended colon due to stool and gas. No evidence of perforation. There is a pigtail catheter in the pelvis Electronically Signed   By: David  Martinique M.D.   On: 04/26/2017 09:24   No results for input(s): WBC, HGB, HCT, PLT in the last 72 hours. No results for input(s): NA, K, CL, GLUCOSE, BUN, CREATININE, CALCIUM in the last 72 hours.  Invalid input(s): CO CBG (last 3)  Recent Labs    04/26/17 2101 04/27/17 0643 04/27/17 0715  GLUCAP 71 23* 93    Wt Readings from Last 3 Encounters:  04/19/17 68 kg (149 lb 14.6 oz)  03/23/17 74.4 kg (164 lb 0.4 oz)    Physical Exam:  Constitutional: Sitting in bed in no distress HENT:  Head: Normocephalicand atraumatic.  Eyes: Pupils are equal, round, and reactive to light. EOMare normal.  Neck: Normal range of motion. Neck supple. No thyromegalypresent.  Trach stoma  dressed with pink tape, no air leakage Cardiovascular: Rate controlled Respiratory: Normal effort, lungs clear GI: Belly remains distended with scarce bowel sounds.  Belly nontender  Skin. Warm and dry, thin Musculoskeletal. Patient with tr edema to the right hand. Palpable pedal pulses lower extremities Genitourinary.  Suprapubic tube in place, urine clear Neurological. Awake and alert. Limited insight and awareness. Follows simple commands.  Oriented to place, person, month, reason he's here. UE 3+/5 Deltoid, biceps, triceps and 4- HI. LE: 2 out of 5 proximal to 2+/3- distally. No sensory deficit. Motor exam stable Psych: Affect remains flat.  Did engage a bit more today.   Assessment/Plan: 1. 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 Physiatrist is providing close team supervision and 24 hour management of active medical problems listed below. Physiatrist and rehab team continue to assess barriers to discharge/monitor patient progress toward functional and medical goals.  Function:  Bathing Bathing position   Position: Bed  Bathing parts Body parts bathed by patient: Chest, Abdomen Body parts bathed by helper: Right arm, Left arm, Buttocks, Right upper leg, Left upper leg, Right lower leg, Left lower leg, Back  Bathing assist Assist Level: (total assist, night bath by nursing)      Upper Body Dressing/Undressing Upper body dressing   What is the patient wearing?: Pull over shirt/dress     Pull over shirt/dress - Perfomed by patient: Thread/unthread left sleeve  Pull over shirt/dress - Perfomed by helper: Thread/unthread right sleeve, Thread/unthread left sleeve, Put head through opening, Pull shirt over trunk        Upper body assist Assist Level: (total assist)      Lower Body Dressing/Undressing Lower body dressing   What is the patient  wearing?: Pants, Non-skid slipper socks       Pants- Performed by helper: Thread/unthread right pants leg, Thread/unthread left pants leg, Pull pants up/down Non-skid slipper socks- Performed by patient: Don/doff right sock, Don/doff left sock Non-skid slipper socks- Performed by helper: Don/doff right sock, Don/doff left sock                  Lower body assist Assist for lower body dressing: (Total A)      Toileting Toileting   Toileting steps completed by patient: Adjust clothing prior to toileting, Adjust clothing after toileting Toileting steps completed by helper: Adjust clothing prior to toileting, Performs perineal hygiene, Adjust clothing after toileting    Toileting assist Assist level: Two helpers   Transfers Chair/bed transfer   Chair/bed transfer method: Lateral scoot Chair/bed transfer assist level: Moderate assist (Pt 50 - 74%/lift or lower) Chair/bed transfer assistive device: Armrests, Sliding board Mechanical lift: Teacher, music activity did not occur: Safety/medical concerns         Wheelchair   Type: Manual Max wheelchair distance: 50 Assist Level: Touching or steadying assistance (Pt > 75%)  Cognition Comprehension Comprehension assist level: Follows basic conversation/direction with no assist  Expression Expression assist level: Expresses basic needs/ideas: With no assist  Social Interaction Social Interaction assist level: Interacts appropriately with others with medication or extra time (anti-anxiety, antidepressant).  Problem Solving Problem solving assist level: Solves complex problems: With extra time  Memory Memory assist level: Recognizes or recalls 75 - 89% of the time/requires cueing 10 - 24% of the time   Medical Problem List and Plan: 1. Decreased functional mobility with cognitive deficitssecondary to TBI/polytrauma 02/07/2017, additional deconditioning due to prolonged hospital stay -continue  PT, OT SLP  -continue with therapies to tolerance 2. DVT Prophylaxis/Anticoagulation: Subcutaneous Lovenox. Check vascular study pending 3. Pain Management: Oxycodone 5 mg every 6 hours as needed -fair control 4. Mood: Clonazepam 0.5 mg daily at bedtime, trazodone 50 mg daily at bedtime, Seroquel 100 mg daily at bedtime, Zoloft 50 mg daily, amantadine 100 mg daily, Provigil 100 mg daily   -continue sleep chart 5. Neuropsych: This patient iscapable of making decisions on hisown behalf. 6. Skin/Wound Care: Routine skin checks  - continue air mattress due to sensitive skin    7. Fluids/Electrolytes/Nutrition: Routine I&O with follow-up chemistries 8.Nondisplaced fractures of right T7 transverse process. Conservative care 9.Multiple pelvic fractures/bilateral sacroiliac joints and suprapubic symphysis as well as left pelvic sidewall hematoma. Status post sacroiliac screw fixation with external fixation pelvis anterior pelvic ring closed reduction 02/07/2017. May ambulate extended distances weightbearing as tolerated without restrictions. 10.Seizure prophylaxis. Keppra 500 mg every 12 hours. EEG negative 11.Tracheostomy tube 03/01/2017 per Dr. Hulen Skains. Decannulated.   -Need to try pink tape in more compressive dressing to stoma to prevent air leakage 12.Gastrostomy tube 03/01/2017 per Dr. Hulen Skains. Presently not receiving tube feeds. Diet advanced to mechanical soft and liquids.  -belly remains distended. Ileus on kub yesterday  -continue IV reglan qid  -change diet to full liquids  -mag citrate and SSE today  - hold on removal of PEG until belly is softer. 13.Urinary retention/urethral injury. Status post suprapubic catheter placement 03/21/2017  per interventional radiology. Follow-up urology services Dr. Louis Meckel  -[facility] with sediment  -INR changed cath out yesterday  -100k proteus from cath specimen: empiric bactrim started. UCS pending 14.Acute blood loss anemia. Follow-up  CBC. Continue iron supplement 15. DM 2: hypoglycemic this morning  -on CM diet--changed to regular carb, full-liquid for now  -amaryl 2mg ---hold for now   -adjusted SSI to more sensitive level to avoid overtreating 16. Tachycardia  - Likely due to debility   -improved with increased metoprolol  LOS (Days) 12 A FACE TO FACE EVALUATION WAS PERFORMED  Alger Simons T, MD 04/27/2017 8:05 AM

## 2017-04-27 NOTE — Progress Notes (Signed)
Speech Language Pathology Daily Session Note  Patient Details  Name: Peter [name] MRN: [id number] Date of Birth: 1960/12/29  Today's Date: 04/27/2017 SLP Individual Time: 1015-1100 SLP Individual Time Calculation (min): 45 min  Short Term Goals: Week 2: SLP Short Term Goal 1 (Week 2): Pt will utilize external memory aids to recall new daily information with supervision cues.  SLP Short Term Goal 2 (Week 2): Pt will selective attention for ~ 30 minutes with Min A cues.  SLP Short Term Goal 3 (Week 2): Pt will demonstrate intellectual awarness by identifying 3 physcial and 3 cognitive deficits with Min A cues.  SLP Short Term Goal 4 (Week 2): Pt will complete mildly complex problems with Min A cues.   Skilled Therapeutic Interventions: Skilled treatment session focused on cognitive goals. Upon arrival, patient was awake while upright in the wheelchair and expressed discomfort due to a"kink" in his catheter. RN aware and assessed catheter and reported there were no "kinks." However, patient continued to perseverate throughout session about "pressure" on his bladder due to the "kink" and was insistent of getting back to bed to further assess catheter. Patient was transferred back to bed with +2 via the slideboard. A second RN re-assessed the catheter and also told the patient there were no "kinks." Patient was then willing to participate in a basic medication management task that he completed with Min A verbal cues for problem solving. Patient left supine in bed with all needs within reach. Continue with current plan of care.      Function:   Cognition Comprehension Comprehension assist level: Follows basic conversation/direction with no assist  Expression   Expression assist level: Expresses basic needs/ideas: With no assist  Social Interaction Social Interaction assist level: Interacts appropriately 50 - 74% of the time - May be physically or verbally inappropriate.  Problem Solving  Problem solving assist level: Solves basic 50 - 74% of the time/requires cueing 25 - 49% of the time  Memory Memory assist level: Recognizes or recalls 50 - 74% of the time/requires cueing 25 - 49% of the time    Pain Pain Assessment Pain Assessment: Faces Faces Pain Scale: No hurt  Therapy/Group: Individual Therapy  Genean Adamski, [place] 04/27/2017, 1:51 PM

## 2017-04-27 NOTE — Progress Notes (Signed)
Immense results from soap suds enema of soft stool.after Mag citrate in am

## 2017-04-27 NOTE — Progress Notes (Signed)
Hypoglycemic Event  CBG: 23  Treatment: orange juice, unsuccessful, glucagen iml IV  Symptoms: lethargic  Follow-up CBG: Time 0715   CBG Result:93  Possible Reasons for Event: no given snack last night  Comments/MD notified: Dr. Mila Merry

## 2017-04-27 NOTE — Progress Notes (Signed)
Occupational Therapy Note  Patient Details  Name: Peter [name] MRN: [id number] Date of Birth: 1960-10-13  Today's Date: 04/27/2017 OT Individual Time: 1330-1400 OT Individual Time Calculation (min): 30 min   Pt c/o ongoing abdominal discomfort; RN aware Individual Therapy  Pt resting in bed upon arrival with wife present.  Focus on Kinesio Tape application to B upper traps and BUE PROM, AAROM, and AROM with 2# cuff weights. Pt remained in bed awaiting RN for enema.     [name] [facility] 04/27/2017, 2:47 PM

## 2017-04-27 NOTE — Progress Notes (Signed)
Physical Therapy Session Note  Patient Details  Name: Peter [name] MRN: [id number] Date of Birth: 10-26-1960  Today's Date: 04/27/2017 PT Individual Time: 1300-1330 and 1430-1530 PT Individual Time Calculation (min): 30 min and 60 min (total 90 min)   Short Term Goals: Week 2:  PT Short Term Goal 1 (Week 2): Pt will perform bed mobility with minA PT Short Term Goal 2 (Week 2): Pt will perform transfers with consistent minA using slideboard PT Short Term Goal 3 (Week 2): Pt will initiate gait training PT Short Term Goal 4 (Week 2): Pt will propel w/c x75' with S  Skilled Therapeutic Interventions/Progress Updates: Tx 1: make up session. Pt received supine in bed with c/o abdominal discomfort and distension awaiting enema, agreeable to treatment. Supine>sit with maxA and bedrails. MaxA sitting balance on edge of bed with one posterior LOB requiring maxA to recover. Transfer bed>w/c maxA with slideboard. BP assessed in sitting 80/55 with HR 88. TEDs donned with no significant improvement in BP, however pt asymptomatic. W/c propulsion x30' minA for UE strengthening and endurance. Returned to bed [employer] transfer. TotalA +2 sit >supine. Remained supine in bed with handoff to RN at end of session, all needs in reach.   Tx 2: Pt received supine in bed, reports unable to void and continues to c/o abdominal discomfort; declines OOB therapy but agreeable to attempt tilt table. Lateral scoot transfer in supine to/from tilt table totalA. Tolerated 40-50 degrees x3 trials with BP dropping each time (93/62 first trial, down to 77/53 by third trial with B TEDs and ace wraps intact) however pt asymptomatic throughout. While elevated on table, pt performed heel raises, glute sets, quad sets, bicep curls, chest press 2x15 reps each. While in supine rest break for BP to normalize pt performed BLE x15 reps short arc quads AAROM. Final trial of standing, therapist placed towel roll under B forefoot to  facilitate increased gastroc ROM. Returned to supine with lateral transfer as above; remained supine in bed at end of session, all needs in reach. PA aware of low BP at this time.      Therapy Documentation Precautions:  Precautions Precautions: Fall Precaution Comments: trach, PEG, catheter  Required Braces or Orthoses: Other Brace/Splint Other Brace/Splint: bil PRAFO, abdominal binder, bil hand splints at night Restrictions Weight Bearing Restrictions: Yes RLE Weight Bearing: Weight bearing as tolerated LLE Weight Bearing: Weight bearing as tolerated Other Position/Activity Restrictions: no ROM restrictions bil LE General:  Missed 30 min AM session d/t abdominal pain/fatigue Pain: Pain Assessment Pain Assessment: Faces Faces Pain Scale: No hurt   See Function Navigator for Current Functional Status.   Therapy/Group: Individual Therapy  Luberta Mutter 04/27/2017, 1:30 PM

## 2017-04-27 NOTE — Consult Note (Signed)
[facility] Nurse wound follow up Wound type:HADRPI and intertriginous dermatitis Measurement: right upper anterior thigh 3cm x 1.5cm x 0.1cm, right proximal groin 1cm x 1cm x 0.1cm, left side of penis shaft 0.5cm round x 0.1cm Wound bed: 100% pink Drainage (amount, consistency, odor) none noted Periwound: intact Dressing procedure/placement/frequency: All noted wounds are in healing stage with foam dressings for protection, except penile shaft wrapped with Xeroform. Noted an odor that did not seem to be from these wounds. In assessing area I found that the odor was from his groins from intertriginous moisture. Will keep all wound care orders the same and add InterDry Ag+ for groins. Instructed wife in use and purpose. She seemed familiar with it. [place] team will continue to follow this patient while he is in house.  Please re-consult if we need to assist further in between visits.   Fara Olden, RN-C, WTA-C, [place] Wound Treatment Associate Ostomy Care Associate

## 2017-04-27 NOTE — Progress Notes (Signed)
Occupational Therapy Session Note  Patient Details  Name: Peter [name] MRN: [id number] Date of Birth: Mar 09, 1961  Today's Date: 04/27/2017 OT Individual Time: 0830-0900 OT Individual Time Calculation (min): 30 min    Short Term Goals: Week 2:  OT Short Term Goal 1 (Week 2): Pt will complete LB dressing at sit<stand level with LRAD OT Short Term Goal 2 (Week 2): Pt will perform squat pivot transfer to [facility] with max A OT Short Term Goal 3 (Week 2): Pt will perform UB dressing tasks with mod A OT Short Term Goal 4 (Week 2): Pt will perform LB dressing tasks with max A sit<>stand.  Skilled Therapeutic Interventions/Progress Updates:    Pt presented supine in bed, reporting fatigue and some nausea (RN just administered meds), but agreeable to OT tx session from bed level. Pt participated A/AA/PROM and stretching to bil UEs, completing 10 reps for each motion across planes of shoulder flexion/extension, horizontal abduction, forearm supination/pronation, elbow, wrist, and digit flex/ext. Pt then participated in theraputty activity with focus on increasing [place] in bil hands, with intermittent HOH assist for facilitating R hand motions. Pt left supine in bed, call bell and needs within reach, spouse present.   Therapy Documentation Precautions:  Precautions Precautions: Fall Precaution Comments: trach, PEG, catheter  Required Braces or Orthoses: Other Brace/Splint Other Brace/Splint: bil PRAFO, abdominal binder, bil hand splints at night Restrictions Weight Bearing Restrictions: Yes RLE Weight Bearing: Weight bearing as tolerated LLE Weight Bearing: Weight bearing as tolerated Other Position/Activity Restrictions: no ROM restrictions bil LE   Pain: Pain Assessment Pain Assessment: Faces Faces Pain Scale: No hurt ADL: ADL ADL Comments: Please see functional navigator for ADL status  See Function Navigator for Current Functional Status.   Therapy/Group: Individual  Therapy  Raymondo Band 04/27/2017, 12:24 PM

## 2017-04-27 NOTE — Progress Notes (Signed)
Patient has taken off his hand splints & refuses his prafo boots.Educated.

## 2017-04-27 NOTE — Progress Notes (Signed)
Occupational Therapy Session Note  Patient Details  Name: [name] MRN: [id number] Date of Birth: January 25, 1961  Today's Date: 04/27/2017 OT Individual Time: 0900-1000 OT Individual Time Calculation (min): 60 min    Short Term Goals: Week 2:  OT Short Term Goal 1 (Week 2): Pt will complete LB dressing at sit<stand level with LRAD OT Short Term Goal 2 (Week 2): Pt will perform squat pivot transfer to [facility] with max A OT Short Term Goal 3 (Week 2): Pt will perform UB dressing tasks with mod A OT Short Term Goal 4 (Week 2): Pt will perform LB dressing tasks with max A sit<>stand.  Skilled Therapeutic Interventions/Progress Updates:    Pt resting in bed upon arrival with wife present.  Pt c/o ongoing abdominal pain.  Pt incontinent of bowel and required mod A for rolling in bed to facilitate hygiene.  Pt required min A for supine>sit EOB in preparation for doffing/donning pullover shirt.  Pt required min A for sitting balance, max A for doffing/donning shirt.  Pt required max A for slide board transfer to w/c.  Pt completed grooming tasks seated in w/c at sink.  Pt used electric razor to shave but required assistance for thoroughness.  Pt agreed to remain in w/c through following therapy.  Focus on activity tolerance, bed mobility, funcitonal transfers, UB dressing and grooming tasks, sitting balance, and safety awareness to increase independence with BADLs.   Therapy Documentation Precautions:  Precautions Precautions: Fall Precaution Comments: trach, PEG, catheter  Required Braces or Orthoses: Other Brace/Splint Other Brace/Splint: bil PRAFO, abdominal binder, bil hand splints at night Restrictions Weight Bearing Restrictions: Yes RLE Weight Bearing: Weight bearing as tolerated LLE Weight Bearing: Weight bearing as tolerated Other Position/Activity Restrictions: no ROM restrictions bil LE Pain:  Pt c/o abdominal discomfort and nausea; RN aware and repositioned See Function Navigator  for Current Functional Status.   Therapy/Group: Individual Therapy  [name] 04/27/2017, 11:38 AM

## 2017-04-28 ENCOUNTER — Inpatient Hospital Stay (HOSPITAL_COMMUNITY): Payer: BLUE CROSS/BLUE SHIELD

## 2017-04-28 ENCOUNTER — Inpatient Hospital Stay (HOSPITAL_COMMUNITY): Payer: BLUE CROSS/BLUE SHIELD | Admitting: Physical Therapy

## 2017-04-28 DIAGNOSIS — G8220 Paraplegia, unspecified: Secondary | ICD-10-CM

## 2017-04-28 LAB — BASIC METABOLIC PANEL
ANION GAP: 9 (ref 5–15)
BUN: 42 mg/dL — ABNORMAL HIGH (ref 6–20)
CALCIUM: 8.2 mg/dL — AB (ref 8.9–10.3)
CO2: 23 mmol/L (ref 22–32)
CREATININE: 1.25 mg/dL — AB (ref 0.61–1.24)
Chloride: 95 mmol/L — ABNORMAL LOW (ref 101–111)
GLUCOSE: 83 mg/dL (ref 65–99)
Potassium: 3.4 mmol/L — ABNORMAL LOW (ref 3.5–5.1)
Sodium: 127 mmol/L — ABNORMAL LOW (ref 135–145)

## 2017-04-28 LAB — CBC WITH DIFFERENTIAL/PLATELET
BASOS ABS: 0 10*3/uL (ref 0.0–0.1)
BASOS PCT: 0 %
EOS ABS: 0.3 10*3/uL (ref 0.0–0.7)
EOS PCT: 3 %
HCT: 29.1 % — ABNORMAL LOW (ref 39.0–52.0)
Hemoglobin: 9.7 g/dL — ABNORMAL LOW (ref 13.0–17.0)
Lymphocytes Relative: 16 %
Lymphs Abs: 1.4 10*3/uL (ref 0.7–4.0)
MCH: 28.8 pg (ref 26.0–34.0)
MCHC: 33.3 g/dL (ref 30.0–36.0)
MCV: 86.4 fL (ref 78.0–100.0)
MONO ABS: 1.2 10*3/uL — AB (ref 0.1–1.0)
MONOS PCT: 14 %
Neutro Abs: 5.8 10*3/uL (ref 1.7–7.7)
Neutrophils Relative %: 67 %
PLATELETS: 374 10*3/uL (ref 150–400)
RBC: 3.37 MIL/uL — ABNORMAL LOW (ref 4.22–5.81)
RDW: 14.4 % (ref 11.5–15.5)
WBC: 8.7 10*3/uL (ref 4.0–10.5)

## 2017-04-28 LAB — GLUCOSE, CAPILLARY
GLUCOSE-CAPILLARY: 124 mg/dL — AB (ref 65–99)
Glucose-Capillary: 96 mg/dL (ref 65–99)
Glucose-Capillary: 157 mg/dL — ABNORMAL HIGH (ref 65–99)
Glucose-Capillary: 169 mg/dL — ABNORMAL HIGH (ref 65–99)

## 2017-04-28 MED ORDER — SENNOSIDES-DOCUSATE SODIUM 8.6-50 MG PO TABS
2.0000 | ORAL_TABLET | Freq: Two times a day (BID) | ORAL | Status: DC
  Administered 2017-04-28 – 2017-05-02 (×2): 2 via ORAL
  Filled 2017-04-28 (×8): qty 2

## 2017-04-28 MED ORDER — SORBITOL 70 % SOLN
960.0000 mL | TOPICAL_OIL | Freq: Once | ORAL | Status: AC
  Administered 2017-04-28: 960 mL via RECTAL
  Filled 2017-04-28: qty 473

## 2017-04-28 NOTE — Progress Notes (Signed)
[place] PHYSICAL MEDICINE & REHABILITATION     PROGRESS NOTE    Subjective/Complaints: Pt remains distended, discussed diagnosiss of paralytic ileus as well as his xray results and med management Denies taking PRN oxyIR for pain  ROS: pt denies nausea, vomiting, diarrhea, cough, shortness of breath or chest pain     Objective: Vital Signs: Blood pressure 105/75, pulse 94, temperature 97.8 F (36.6 C), temperature source Oral, resp. rate 16, height 5\' 10"  (1.778 m), weight 68 kg (149 lb 14.6 oz), SpO2 94 %. Dg Abd 1 View  Result Date: 04/28/2017 CLINICAL DATA:  Follow-up ileus EXAM: ABDOMEN - 1 VIEW COMPARISON:  04/26/2017 FINDINGS: Postsurgical changes are again noted involving the sacroiliac joints and pubic symphysis. Suprapubic catheter is noted in satisfactory position. Gastrostomy catheter is noted as well. Multiple gallstones are seen in the right upper quadrant. Scattered large and small bowel gas is noted. The small bowel dilatation is increased from the prior exam. The degree of gas distention of the colon is relatively stable although there has been considerable evacuation of fecal material from the prior exam. No free air is seen. No other focal abnormality is noted. IMPRESSION: Increase in the degree of small bowel dilatation when compared with the prior exam. Decrease in the stool burden when compared with the prior exam. Electronically Signed   By: Inez Catalina M.D.   On: 04/28/2017 08:28   Ir Catheter Tube Change  Result Date: 04/27/2017 INDICATION: Suprapubic catheter exchange EXAM: SUPRAPUBIC CATHETER EXCHANGE MEDICATIONS: The patient is currently admitted to the hospital and receiving intravenous antibiotics. The antibiotics were administered within an appropriate time frame prior to the initiation of the procedure. ANESTHESIA/SEDATION: None COMPLICATIONS: None immediate. PROCEDURE: Informed written consent was obtained from the patient after a thorough discussion of the  procedural risks, benefits and alternatives. All questions were addressed. Maximal Sterile Barrier Technique was utilized including caps, mask, sterile gowns, sterile gloves, sterile drape, hand hygiene and skin antiseptic. A timeout was performed prior to the initiation of the procedure. The suprapubic catheter site was prepped and draped in a sterile fashion. 1% lidocaine was utilized for local anesthesia. The existing 12 French catheter was exchanged over an Amplatz wire for a 14 French catheter. It was looped and string fixed in the bladder then sewn to the skin. Contrast was injected. FINDINGS: Images document exchange of the suprapubic catheter which was up sized to 14 [place]. The tip is coiled in the bladder. IMPRESSION: Successful exchange of the 71 French catheter for a 14 French pigtail catheter. On the subsequent visit, consider exchange of the pigtail for a 14 French Foley catheter by interventional radiology. Subsequent catheter exchanges can then be performed at the bedside without imaging guidance. Electronically Signed   By: Marybelle Killings M.D.   On: 04/27/2017 08:11   Recent Labs    04/27/17 0922 04/28/17 0403  WBC 14.8* 8.7  HGB 10.3* 9.7*  HCT 31.2* 29.1*  PLT 416* 374   Recent Labs    04/27/17 0922 04/28/17 0403  NA 124* 127*  K 3.6 3.4*  CL 93* 95*  GLUCOSE 138* 83  BUN 40* 42*  CREATININE 1.04 1.25*  CALCIUM 8.3* 8.2*   CBG (last 3)  Recent Labs    04/27/17 1652 04/27/17 2101 04/28/17 0637  GLUCAP 86 101* 96    Wt Readings from Last 3 Encounters:  04/19/17 68 kg (149 lb 14.6 oz)  03/23/17 74.4 kg (164 lb 0.4 oz)    Physical Exam:  Constitutional: Sitting in bed in no distress HENT:  Head: Normocephalicand atraumatic.  Eyes: Pupils are equal, round, and reactive to light. EOMare normal.  Neck: Normal range of motion. Neck supple. No thyromegalypresent.  Trach stoma dressed with pink tape, no air leakage Cardiovascular: Rate controlled Respiratory:  Normal effort, lungs clear GI: Belly remains distended with scarce bowel sounds.  Belly nontender  Skin. Warm and dry, thin Musculoskeletal. Patient with tr edema to the right hand. Palpable pedal pulses lower extremities Genitourinary.  Suprapubic tube in place, urine clear Neurological. Awake and alert. Limited insight and awareness. Follows simple commands.  Oriented to place, person, month, reason he's here. UE Bilateral 4/5 Deltoid, biceps, triceps and 4 HI. LE: 2- / 5 Bilateral HF, KE, ADF,APFsensatio reduced in bialteral feet with proprio, preserved LT in left foot Motor exam stable Psych: Affect remains flat.   Assessment/Plan: 1. 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 Physiatrist is providing close team supervision and 24 hour management of active medical problems listed below. Physiatrist and rehab team continue to assess barriers to discharge/monitor patient progress toward functional and medical goals.  Function:  Bathing Bathing position   Position: Bed  Bathing parts Body parts bathed by patient: Chest, Abdomen Body parts bathed by helper: Right arm, Right lower leg, Left arm, Left lower leg, Chest, Abdomen, Back, Front perineal area, Buttocks, Right upper leg, Left upper leg  Bathing assist Assist Level: Touching or steadying assistance(Pt > 75%)      Upper Body Dressing/Undressing Upper body dressing   What is the patient wearing?: Hospital gown     Pull over shirt/dress - Perfomed by patient: Thread/unthread left sleeve Pull over shirt/dress - Perfomed by helper: Thread/unthread right sleeve, Thread/unthread left sleeve, Pull shirt over trunk, Put head through opening        Upper body assist Assist Level: (total assist)      Lower Body Dressing/Undressing Lower body dressing   What is the patient wearing?: Pants, Non-skid  slipper socks       Pants- Performed by helper: Thread/unthread right pants leg, Thread/unthread left pants leg, Pull pants up/down Non-skid slipper socks- Performed by patient: Don/doff right sock, Don/doff left sock Non-skid slipper socks- Performed by helper: Don/doff right sock, Don/doff left sock                  Lower body assist Assist for lower body dressing: (Total A)      Toileting Toileting   Toileting steps completed by patient: Adjust clothing prior to toileting, Adjust clothing after toileting Toileting steps completed by helper: Adjust clothing prior to toileting, Performs perineal hygiene, Adjust clothing after toileting    Toileting assist Assist level: Two helpers   Transfers Chair/bed transfer   Chair/bed transfer method: Lateral scoot Chair/bed transfer assist level: Maximal assist (Pt 25 - 49%/lift and lower) Chair/bed transfer assistive device: Armrests, Sliding board Mechanical lift: Primary school teacher Ambulation activity did not occur: Safety/medical concerns         Wheelchair   Type: Manual Max wheelchair distance: 30 Assist Level: Touching or steadying assistance (Pt > 75%)  Cognition Comprehension Comprehension assist level: Follows complex conversation/direction with extra time/assistive device  Expression Expression assist level: Expresses complex ideas: With extra time/assistive device  Social Interaction Social Interaction assist level: Interacts appropriately with others - No medications needed.  Problem Solving Problem solving assist level: Solves basic 50 -  74% of the time/requires cueing 25 - 49% of the time  Memory Memory assist level: Recognizes or recalls 50 - 74% of the time/requires cueing 25 - 49% of the time   Medical Problem List and Plan: 1. Decreased functional mobility with cognitive deficitssecondary to TBI/polytrauma 02/07/2017, additional deconditioning due to prolonged hospital stay -continue  PT, OT SLP  -continue with therapies to tolerance 2. DVT Prophylaxis/Anticoagulation: Subcutaneous Lovenox. Check vascular study pending 3. Pain Management: Oxycodone 5 mg every 6 hours as needed -fair control 4. Mood: Clonazepam 0.5 mg daily at bedtime, trazodone 50 mg daily at bedtime, Seroquel 100 mg daily at bedtime, Zoloft 50 mg daily, amantadine 100 mg daily, Provigil 100 mg daily   -continue sleep chart 5. Neuropsych: This patient iscapable of making decisions on hisown behalf. 6. Skin/Wound Care: Routine skin checks  - continue air mattress due to sensitive skin    7. Fluids/Electrolytes/Nutrition: Routine I&O with follow-up chemistries 8.Nondisplaced fractures of right T7 transverse process. Conservative care 9.Multiple pelvic fractures/bilateral sacroiliac joints and suprapubic symphysis as well as left pelvic sidewall hematoma. Status post sacroiliac screw fixation with external fixation pelvis anterior pelvic ring closed reduction 02/07/2017. May ambulate extended distances weightbearing as tolerated without restrictions. 10.Seizure prophylaxis. Keppra 500 mg every 12 hours. EEG negative 11.Tracheostomy tube 03/01/2017 per Dr. Hulen Skains. Decannulated.   -Need to try pink tape in more compressive dressing to stoma to prevent air leakage 12.GI: Gastrostomy tube 03/01/2017 per Dr. Hulen Skains. Presently not receiving tube feeds. Diet advanced to mechanical soft and liquids.   -belly remains distended. Ileus on kub 04/28/2017, stool burden slightly improved.  -continue IV reglan qid  -change diet to full liquids  -mag citrate and SSE repeat today, stool softeners as well  - hold on removal of PEG until belly is softer. 13.Urinary retention/urethral injury. Status post suprapubic catheter placement 03/21/2017 per interventional radiology. Follow-up urology services Dr. Louis Meckel  -[facility]  changed cath out 04/27/2017  -100k proteus from cath specimen: empiric bactrim started. UCS  shows sensitivity to this antibiotic. 14.Acute blood loss anemia. Follow-up CBC. Continue iron supplement 15. DM 2:   -on CM diet--changed to regular carb, full-liquid for now  -amaryl 2mg ---hold for now    CBG (last 3)  Recent Labs    04/27/17 1652 04/27/17 2101 04/28/17 0637  GLUCAP 86 101* 96  Controlled 04/28/2017 16. Tachycardia  - Likely due to debility   -improved with increased metoprolol  LOS (Days) 13 A FACE TO FACE EVALUATION WAS PERFORMED  Charlett Blake, MD 04/28/2017 9:16 AM

## 2017-04-28 NOTE — Progress Notes (Signed)
Occupational Therapy Note  Patient Details  Name: Peter [name] MRN: [id number] Date of Birth: Jul 04, 1960  Today's Date: 04/28/2017 OT Missed Time: 75 Minutes Missed Time Reason: (off unit for medical procedure)  Pt missed 60 mins skilled OT services 2/2 off unit for medical procedure   [name] 04/28/2017, 10:57 AM

## 2017-04-28 NOTE — Progress Notes (Signed)
Physical Therapy Session Note  Patient Details  Name: Peter [name] MRN: [id number] Date of Birth: 1960/08/10  Today's Date: 04/28/2017 PT Individual Time: 1000-1100 and 1300-1355 PT Individual Time Calculation (min): 60 min and 55 min (total 115 min)  Short Term Goals: Week 2:  PT Short Term Goal 1 (Week 2): Pt will perform bed mobility with minA PT Short Term Goal 2 (Week 2): Pt will perform transfers with consistent minA using slideboard PT Short Term Goal 3 (Week 2): Pt will initiate gait training PT Short Term Goal 4 (Week 2): Pt will propel w/c x75' with S  Skilled Therapeutic Interventions/Progress Updates: Tx 1: Pt received supine in bed, denies pain but does report "weakness" and agreeable to treatment. Supine>sit modA. Sit <>stand in stedy with maxA, assist for facilitating anterior weight shift and hip extension in stance. Stedy seat placed behind pt for short rest break, then performed another sit >stand and sat to rest on bed. Performed 3 additional attempts with assist as above for focus on LE strengthening and standing tolerance, LE ROM. Pt reports light headedness on final trial; BP assessed 89/59. Returned to supine with maxA with pt reporting dizziness resolved.BLE strengthening short arc quads and bridging with bolster under LEs 2x15 reps each. Second trial of short arc quads used 1# ankle weight on BLE; educated pt and sister on observing/cueing for full ROM and removing weight if unable to complete 10-15 reps with weight correctly. Remained supine in bed all needs in reach.   Tx 2: Pt received supine in bed on bedpan; denies pain. Rolling R/L with minA to remove bedpan, place brief. Pt continues to be incontinent of bowel once brief placed.Supine>sit modA with HOB elevated. Sit <>stand maxA in stedy, +2A to manage equipment and transfer to [facility]. Pt and sister educated on purpose of sitting upright in gravity dependent position to assist with voiding. Pt required several  minutes sitting on toilet before finished. +2 transfer with stedy back to bed. Rolling R/L to perform hygiene and don clean brief. RN alerted to pt position and need for bandages changed. Remained supine, all needs in reach and family present.      Therapy Documentation Precautions:  Precautions Precautions: Fall Precaution Comments: trach, PEG, catheter  Required Braces or Orthoses: Other Brace/Splint Other Brace/Splint: bil PRAFO, abdominal binder, bil hand splints at night Restrictions Weight Bearing Restrictions: Yes RLE Weight Bearing: Weight bearing as tolerated LLE Weight Bearing: Weight bearing as tolerated Other Position/Activity Restrictions: no ROM restrictions bil LE   See Function Navigator for Current Functional Status.   Therapy/Group: Individual Therapy  Luberta Mutter 04/28/2017, 11:02 AM

## 2017-04-28 NOTE — Progress Notes (Signed)
Speech Language Pathology Daily Session Note  Patient Details  Name: Carron Jaggi MRN: [id number] Date of Birth: 05/08/1961  Today's Date: 04/28/2017 SLP Individual Time: 1638-4665 SLP Individual Time Calculation (min): 42 min  Short Term Goals: Week 2: SLP Short Term Goal 1 (Week 2): Pt will utilize external memory aids to recall new daily information with supervision cues.  SLP Short Term Goal 2 (Week 2): Pt will selective attention for ~ 30 minutes with Min A cues.  SLP Short Term Goal 3 (Week 2): Pt will demonstrate intellectual awarness by identifying 3 physcial and 3 cognitive deficits with Min A cues.  SLP Short Term Goal 4 (Week 2): Pt will complete mildly complex problems with Min A cues.   Skilled Therapeutic Interventions: Skilled ST services focused on cognitive skills. Pt expressed feeling sleepy from medication, SLP communicated with nursing staff and no new pain medication were given. SLP facilitated recall and selective attention skills, during recall of facts from of simple paragraphs, pt demonstrated Min A verbal cues for immediate and delayed recall of 30 seconds in a mildly distracting environment. Pt required Mod A verbal cues for sustained attention during tasks, "drifting in and out" stated pt. Session was ended early due to nursing procedure. Recommend to continue skilled ST services.     Function:  Eating Eating                 Cognition Comprehension Comprehension assist level: Understands complex 90% of the time/cues 10% of the time  Expression   Expression assist level: Expresses basic 90% of the time/requires cueing < 10% of the time.  Social Interaction Social Interaction assist level: Interacts appropriately 90% of the time - Needs monitoring or encouragement for participation or interaction.  Problem Solving Problem solving assist level: Solves basic 50 - 74% of the time/requires cueing 25 - 49% of the time  Memory Memory assist level: Recognizes  or recalls 50 - 74% of the time/requires cueing 25 - 49% of the time    Pain Pain Assessment Pain Assessment: No/denies pain  Therapy/Group: Individual Therapy  Orvan Papadakis  [facility] 04/28/2017, 12:10 PM

## 2017-04-28 NOTE — Plan of Care (Signed)
Pt had large stool after intervention on day shift.

## 2017-04-29 ENCOUNTER — Inpatient Hospital Stay (HOSPITAL_COMMUNITY): Payer: BLUE CROSS/BLUE SHIELD

## 2017-04-29 ENCOUNTER — Inpatient Hospital Stay (HOSPITAL_COMMUNITY): Payer: BLUE CROSS/BLUE SHIELD | Admitting: Physical Therapy

## 2017-04-29 DIAGNOSIS — T1490XS Injury, unspecified, sequela: Secondary | ICD-10-CM

## 2017-04-29 LAB — GLUCOSE, CAPILLARY
GLUCOSE-CAPILLARY: 101 mg/dL — AB (ref 65–99)
GLUCOSE-CAPILLARY: 162 mg/dL — AB (ref 65–99)
Glucose-Capillary: 166 mg/dL — ABNORMAL HIGH (ref 65–99)
Glucose-Capillary: 211 mg/dL — ABNORMAL HIGH (ref 65–99)

## 2017-04-29 NOTE — Progress Notes (Signed)
Physical Therapy Session Note  Patient Details  Name: Peter [name] MRN: [id number] Date of Birth: [birth year]  Today's Date: 04/29/2017 PT Individual Time: 1100-1155 PT Individual Time Calculation (min): 55 min   Short Term Goals: Week 2:  PT Short Term Goal 1 (Week 2): Pt will perform bed mobility with minA PT Short Term Goal 2 (Week 2): Pt will perform transfers with consistent minA using slideboard PT Short Term Goal 3 (Week 2): Pt will initiate gait training PT Short Term Goal 4 (Week 2): Pt will propel w/c x75' with S  Skilled Therapeutic Interventions/Progress Updates:   Pt supine upon arrival and agreeable to therapy, no c/o pain. Transferred to EOB and to w/c via stedy w/ Max-Total A overall. Pt required increased time to perform transfer and get ready to leave room w/ ted hose. Total A w/c mobility to day room for time management and worked on reciprocal movement pattern and LE strengthening. Pt performed kinetron w/o resistance in multiple 2-3 min bouts. Rest 2/2 fatigue and increased work of breathing. Returned to room and ended session in w/c and in care of daughter, all needs met.   Therapy Documentation Precautions:  Precautions Precautions: Fall Precaution Comments: trach, PEG, catheter  Required Braces or Orthoses: Other Brace/Splint Other Brace/Splint: bil PRAFO, abdominal binder, bil hand splints at night Restrictions Weight Bearing Restrictions: Yes RLE Weight Bearing: Weight bearing as tolerated LLE Weight Bearing: Weight bearing as tolerated Other Position/Activity Restrictions: no ROM restrictions bil LE  See Function Navigator for Current Functional Status.   Therapy/Group: Individual Therapy  Mattheu Brodersen K Arnette 04/29/2017, 12:40 PM

## 2017-04-29 NOTE — Progress Notes (Signed)
[facility] PHYSICAL MEDICINE & REHABILITATION     PROGRESS NOTE    Subjective/Complaints: Good bowel movement after smog enema. Denies any abdominal pain or nausea or vomiting this morning. Patient feels like his lower extremity pain is now much better. Denies any back pain or mid back pain.  ROS: pt denies nausea, vomiting, diarrhea, cough, shortness of breath or chest pain     Objective: Vital Signs: Blood pressure 121/64, pulse 90, temperature 97.9 F (36.6 C), temperature source Oral, resp. rate 17, height 5\' 10"  (1.778 m), weight 68 kg (149 lb 14.6 oz), SpO2 96 %. Dg Abd 1 View  Result Date: 04/28/2017 CLINICAL DATA:  Follow-up ileus EXAM: ABDOMEN - 1 VIEW COMPARISON:  04/26/2017 FINDINGS: Postsurgical changes are again noted involving the sacroiliac joints and pubic symphysis. Suprapubic catheter is noted in satisfactory position. Gastrostomy catheter is noted as well. Multiple gallstones are seen in the right upper quadrant. Scattered large and small bowel gas is noted. The small bowel dilatation is increased from the prior exam. The degree of gas distention of the colon is relatively stable although there has been considerable evacuation of fecal material from the prior exam. No free air is seen. No other focal abnormality is noted. IMPRESSION: Increase in the degree of small bowel dilatation when compared with the prior exam. Decrease in the stool burden when compared with the prior exam. Electronically Signed   By: Inez Catalina M.D.   On: 04/28/2017 08:28   Recent Labs    04/27/17 0922 04/28/17 0403  WBC 14.8* 8.7  HGB 10.3* 9.7*  HCT 31.2* 29.1*  PLT 416* 374   Recent Labs    04/27/17 0922 04/28/17 0403  NA 124* 127*  K 3.6 3.4*  CL 93* 95*  GLUCOSE 138* 83  BUN 40* 42*  CREATININE 1.04 1.25*  CALCIUM 8.3* 8.2*   CBG (last 3)  Recent Labs    04/28/17 1642 04/28/17 2051 04/29/17 0650  GLUCAP 157* 169* 101*    Wt Readings from Last 3 Encounters:  04/19/17  68 kg (149 lb 14.6 oz)  03/23/17 74.4 kg (164 lb 0.4 oz)    Physical Exam:  Constitutional: Sitting in bed in no distress HENT:  Head: Normocephalicand atraumatic.  Eyes: Pupils are equal, round, and reactive to light. EOMare normal.  Neck: Normal range of motion. Neck supple. No thyromegalypresent.  Trach stoma dressed with pink tape, no air leakage Cardiovascular: Rate controlled Respiratory: Normal effort, lungs clear GI: Belly remains distended with scarce bowel sounds.  Belly nontender  Skin. Warm and dry, thin Musculoskeletal. Patient with tr edema to the right hand. Palpable pedal pulses lower extremities Genitourinary.  Suprapubic tube in place, urine clear Neurological. Awake and alert. Limited insight and awareness. Follows simple commands.  Oriented to place, person, month, reason he's here. UE Bilateral 4/5 Deltoid, biceps, triceps and 4 HI. LE: 2- / 5 Bilateral HF, KE, ADF,APFsensatio reduced in bialteral feet with proprio, preserved LT in left foot Motor exam stable Psych: Affect remains flat.   Assessment/Plan: 1. 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 Physiatrist is providing close team supervision and 24 hour management of active medical problems listed below. Physiatrist and rehab team continue to assess barriers to discharge/monitor patient progress toward functional and medical goals.  Function:  Bathing Bathing position   Position: Bed  Bathing parts Body parts bathed by patient: Chest, Abdomen Body parts bathed  by helper: Right arm, Right lower leg, Left arm, Left lower leg, Chest, Abdomen, Back, Front perineal area, Buttocks, Right upper leg, Left upper leg  Bathing assist Assist Level: Touching or steadying assistance(Pt > 75%)      Upper Body Dressing/Undressing Upper body dressing   What is the patient wearing?:  Hospital gown     Pull over shirt/dress - Perfomed by patient: Thread/unthread left sleeve Pull over shirt/dress - Perfomed by helper: Thread/unthread right sleeve, Thread/unthread left sleeve, Pull shirt over trunk, Put head through opening        Upper body assist Assist Level: (total assist)      Lower Body Dressing/Undressing Lower body dressing   What is the patient wearing?: Pants, Non-skid slipper socks       Pants- Performed by helper: Thread/unthread right pants leg, Thread/unthread left pants leg, Pull pants up/down Non-skid slipper socks- Performed by patient: Don/doff right sock, Don/doff left sock Non-skid slipper socks- Performed by helper: Don/doff right sock, Don/doff left sock                  Lower body assist Assist for lower body dressing: (Total A)      Toileting Toileting   Toileting steps completed by patient: Adjust clothing prior to toileting, Adjust clothing after toileting Toileting steps completed by helper: Adjust clothing prior to toileting, Performs perineal hygiene, Adjust clothing after toileting    Toileting assist Assist level: Two helpers   Transfers Chair/bed transfer   Chair/bed transfer method: Lateral scoot Chair/bed transfer assist level: Maximal assist (Pt 25 - 49%/lift and lower) Chair/bed transfer assistive device: Armrests, Sliding board Mechanical lift: Teacher, music activity did not occur: Safety/medical concerns         Wheelchair   Type: Manual Max wheelchair distance: 30 Assist Level: Touching or steadying assistance (Pt > 75%)  Cognition Comprehension Comprehension assist level: Understands complex 90% of the time/cues 10% of the time  Expression Expression assist level: Expresses basic 90% of the time/requires cueing < 10% of the time.  Social Interaction Social Interaction assist level: Interacts appropriately 90% of the time - Needs monitoring or encouragement for participation or  interaction.  Problem Solving Problem solving assist level: Solves basic 50 - 74% of the time/requires cueing 25 - 49% of the time  Memory Memory assist level: Recognizes or recalls 50 - 74% of the time/requires cueing 25 - 49% of the time   Medical Problem List and Plan: 1. Decreased functional mobility with cognitive deficitssecondary to TBI/polytrauma 02/07/2017, additional deconditioning due to prolonged hospital stay Bilateral lower extremity weakness, may have lumbar plexopathy related to pelvic fractures is versus radiculopathy. However, patient does not have much in terms of back pain. -continue PT, OT SLP  -continue with therapies to tolerance 2. DVT Prophylaxis/Anticoagulation: Subcutaneous Lovenox. Check vascular study pending 3. Pain Management: Oxycodone 5 mg every 6 hours as needed -fair control 4. Mood: Clonazepam 0.5 mg daily at bedtime, trazodone 50 mg daily at bedtime, Seroquel 100 mg daily at bedtime, Zoloft 50 mg daily, amantadine 100 mg daily, Provigil 100 mg daily   -continue sleep chart 5. Neuropsych: This patient iscapable of making decisions on hisown behalf. 6. Skin/Wound Care: Routine skin checks  - continue air mattress due to sensitive skin    7. Fluids/Electrolytes/Nutrition: Routine I&O with follow-up chemistries 8.Nondisplaced fractures of right T7 transverse process. Conservative care 9.Multiple pelvic fractures/bilateral sacroiliac joints and suprapubic symphysis as well as left pelvic sidewall hematoma. Status post  sacroiliac screw fixation with external fixation pelvis anterior pelvic ring closed reduction 02/07/2017. May ambulate extended distances weightbearing as tolerated without restrictions. 10.Seizure prophylaxis. Keppra 500 mg every 12 hours. EEG negative 11.Tracheostomy tube 03/01/2017 per Dr. Hulen Skains. Decannulated.   -Need to try pink tape in more compressive dressing to stoma to prevent air leakage 12.GI:  Gastrostomy tube 03/01/2017 per Dr. Hulen Skains. Presently not receiving tube feeds. Diet advanced to mechanical soft and liquids.   -belly remains mildly distended. Ileus , improving -continue IV reglan qid  -change diet to full liquids  -Continue Senokot S  - hold on removal of PEG until belly is softer. 13.Urinary retention/urethral injury. Status post suprapubic catheter placement 03/21/2017 per interventional radiology. Follow-up urology services Dr. Louis Meckel  -[facility]  changed cath out 04/27/2017  -100k proteus from cath specimen: empiric bactrim started. UCS shows sensitivity to this antibiotic. Continue 7 day course 14.Acute blood loss anemia. Follow-up CBC. Continue iron supplement 15. DM 2:   -on CM diet--changed to regular carb, full-liquid for now  -amaryl 2mg ---hold for now    CBG (last 3)  Recent Labs    04/28/17 1642 04/28/17 2051 04/29/17 0650  GLUCAP 157* 169* 101*  Controlled 04/29/2017 16. Tachycardia  - Likely due to debility   -improved with increased metoprolol  LOS (Days) 14 A FACE TO FACE EVALUATION WAS PERFORMED  Charlett Blake, MD 04/29/2017 10:39 AM

## 2017-04-29 NOTE — Progress Notes (Signed)
Speech Language Pathology Daily Session Note  Patient Details  Name: Peter [name] MRN: [id number] Date of Birth: 10/13/1960  Today's Date: 04/29/2017 SLP Individual Time: 1345-1415 SLP Individual Time Calculation (min): 30 min  Short Term Goals: Week 2: SLP Short Term Goal 1 (Week 2): Pt will utilize external memory aids to recall new daily information with supervision cues.  SLP Short Term Goal 2 (Week 2): Pt will selective attention for ~ 30 minutes with Min A cues.  SLP Short Term Goal 3 (Week 2): Pt will demonstrate intellectual awarness by identifying 3 physcial and 3 cognitive deficits with Min A cues.  SLP Short Term Goal 4 (Week 2): Pt will complete mildly complex problems with Min A cues.   Skilled Therapeutic Interventions: Skilled ST services focused cognitive skills. SLP facilitated semi-complex problem solving and working [employer], [employer] utilizing ordering from take out menu, pt required min A verbal cues. SLP facilitated delayed recall utilizing simple paragraphs given a visual picture scene to reference and instructed in visualization technique, pt required min A verbal cues. Pt demonstrated intellectual awareness of 3 physical deficits and required mod A verbal cues to recall cognitive deficits. Pt was left in room with daughter. Recommend to continue skilled ST services.     Function:  Eating Eating                 Cognition Comprehension Comprehension assist level: Understands complex 90% of the time/cues 10% of the time  Expression   Expression assist level: Expresses basic 90% of the time/requires cueing < 10% of the time.  Social Interaction Social Interaction assist level: Interacts appropriately 90% of the time - Needs monitoring or encouragement for participation or interaction.  Problem Solving Problem solving assist level: Solves basic 75 - 89% of the time/requires cueing 10 - 24% of the time  Memory Memory assist level: Recognizes or recalls 75 - 89%  of the time/requires cueing 10 - 24% of the time    Pain Pain Assessment Pain Assessment: No/denies pain  Therapy/Group: Individual Therapy  Mariellen Blaney  [facility] 04/29/2017, 4:02 PM

## 2017-04-30 ENCOUNTER — Inpatient Hospital Stay (HOSPITAL_COMMUNITY): Payer: BLUE CROSS/BLUE SHIELD

## 2017-04-30 LAB — BASIC METABOLIC PANEL
Anion gap: 7 (ref 5–15)
BUN: 15 mg/dL (ref 6–20)
CHLORIDE: 106 mmol/L (ref 101–111)
CO2: 20 mmol/L — AB (ref 22–32)
Calcium: 8.3 mg/dL — ABNORMAL LOW (ref 8.9–10.3)
Creatinine, Ser: 0.8 mg/dL (ref 0.61–1.24)
GFR calc Af Amer: >60 mL/min (ref >60)
GFR calc non Af Amer: >60 mL/min (ref >60)
GLUCOSE: 171 mg/dL — AB (ref 65–99)
POTASSIUM: 2.6 mmol/L — AB (ref 3.5–5.1)
Sodium: 133 mmol/L — ABNORMAL LOW (ref 135–145)

## 2017-04-30 LAB — GLUCOSE, CAPILLARY
GLUCOSE-CAPILLARY: 83 mg/dL (ref 65–99)
Glucose-Capillary: 170 mg/dL — ABNORMAL HIGH (ref 65–99)
Glucose-Capillary: 205 mg/dL — ABNORMAL HIGH (ref 65–99)
Glucose-Capillary: 206 mg/dL — ABNORMAL HIGH (ref 65–99)

## 2017-04-30 MED ORDER — SODIUM CHLORIDE 0.9 % IV SOLN: INTRAVENOUS | Status: DC

## 2017-04-30 MED ORDER — POTASSIUM CHLORIDE 10 MEQ/100ML IV SOLN
10.0000 meq | INTRAVENOUS | Status: AC
  Administered 2017-04-30 (×3): 10 meq via INTRAVENOUS
  Filled 2017-04-30 (×4): qty 100

## 2017-04-30 NOTE — Plan of Care (Signed)
  RH BLADDER ELIMINATION RH STG MANAGE BLADDER WITH ASSISTANCE Description STG Manage Bladder With Assistance 04/30/2017 1025 - Not Progressing by Ander Slade, RN Note Patient has suprapubic cath

## 2017-04-30 NOTE — Progress Notes (Signed)
[place] PHYSICAL MEDICINE & REHABILITATION     PROGRESS NOTE    Subjective/Complaints: Had bowel movement without laxatives yesterday, appetite improving, therapy, requesting IV out for therapy. Discussed medication management of diabetes with patient. No hypoglycemic episodes since amaryl discontinued  ROS: pt denies nausea, vomiting, diarrhea, cough, shortness of breath or chest pain     Objective: Vital Signs: Blood pressure 139/74, pulse 86, temperature 97.6 F (36.4 C), temperature source Oral, resp. rate 18, height 5\' 10"  (1.778 m), weight 68 kg (149 lb 14.6 oz), SpO2 99 %. No results found. Recent Labs    04/28/17 0403  WBC 8.7  HGB 9.7*  HCT 29.1*  PLT 374   Recent Labs    04/28/17 0403  NA 127*  K 3.4*  CL 95*  GLUCOSE 83  BUN 42*  CREATININE 1.25*  CALCIUM 8.2*   CBG (last 3)  Recent Labs    04/29/17 1631 04/29/17 2057 04/30/17 0749  GLUCAP 162* 211* 83    Wt Readings from Last 3 Encounters:  04/19/17 68 kg (149 lb 14.6 oz)  03/23/17 74.4 kg (164 lb 0.4 oz)    Physical Exam:  Constitutional: Sitting in bed in no distress HENT:  Head: Normocephalicand atraumatic.  Eyes: Pupils are equal, round, and reactive to light. EOMare normal.  Neck: Normal range of motion. Neck supple. No thyromegalypresent.  Trach stoma dressed with pink tape, no air leakage Cardiovascular: Rate controlled Respiratory: Normal effort, lungs clear GI: Belly remains distended with scarce bowel sounds.  Belly nontender  Skin. Warm and dry, thin Musculoskeletal. Patient with tr edema to the right hand. Palpable pedal pulses lower extremities Genitourinary.  Suprapubic tube in place, urine clear Neurological. Awake and alert. Limited insight and awareness. Follows simple commands.  Oriented to place, person, month, reason he's here. UE Bilateral 4/5 Deltoid, biceps, triceps and 4 HI. LE: 2- / 5 Bilateral HF, KE, ADF,APFsensatio reduced in bialteral feet with proprio,  preserved LT in left foot Motor exam stable Psych: Affect remains flat.   Assessment/Plan: 1. 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 Physiatrist is providing close team supervision and 24 hour management of active medical problems listed below. Physiatrist and rehab team continue to assess barriers to discharge/monitor patient progress toward functional and medical goals.  Function:  Bathing Bathing position   Position: Bed  Bathing parts Body parts bathed by patient: Chest, Abdomen Body parts bathed by helper: Right arm, Right lower leg, Left arm, Left lower leg, Chest, Abdomen, Back, Front perineal area, Buttocks, Right upper leg, Left upper leg  Bathing assist Assist Level: Touching or steadying assistance(Pt > 75%)      Upper Body Dressing/Undressing Upper body dressing   What is the patient wearing?: Hospital gown     Pull over shirt/dress - Perfomed by patient: Thread/unthread left sleeve Pull over shirt/dress - Perfomed by helper: Thread/unthread right sleeve, Thread/unthread left sleeve, Pull shirt over trunk, Put head through opening        Upper body assist Assist Level: (total assist)      Lower Body Dressing/Undressing Lower body dressing   What is the patient wearing?: Pants, Non-skid slipper socks       Pants- Performed by helper: Thread/unthread right pants leg, Thread/unthread left pants leg, Pull pants up/down Non-skid slipper socks- Performed by patient: Don/doff right sock, Don/doff left sock Non-skid slipper socks- Performed by helper: Don/doff right sock, Don/doff left  sock                  Lower body assist Assist for lower body dressing: (Total A)      Toileting Toileting   Toileting steps completed by patient: Adjust clothing prior to toileting, Adjust clothing after toileting Toileting steps completed by  helper: Adjust clothing prior to toileting, Performs perineal hygiene, Adjust clothing after toileting    Toileting assist Assist level: Two helpers   Transfers Chair/bed transfer   Chair/bed transfer method: Lateral scoot Chair/bed transfer assist level: dependent (Pt equals 0%) Chair/bed transfer assistive device: Mechanical lift Mechanical lift: Stedy   Locomotion Ambulation Ambulation activity did not occur: Safety/medical concerns         Wheelchair   Type: Manual Max wheelchair distance: 30 Assist Level: Touching or steadying assistance (Pt > 75%)  Cognition Comprehension Comprehension assist level: Understands complex 90% of the time/cues 10% of the time  Expression Expression assist level: Expresses basic 90% of the time/requires cueing < 10% of the time.  Social Interaction Social Interaction assist level: Interacts appropriately 90% of the time - Needs monitoring or encouragement for participation or interaction.  Problem Solving Problem solving assist level: Solves basic 75 - 89% of the time/requires cueing 10 - 24% of the time  Memory Memory assist level: Recognizes or recalls 75 - 89% of the time/requires cueing 10 - 24% of the time   Medical Problem List and Plan: 1. Decreased functional mobility with cognitive deficitssecondary to TBI/polytrauma 02/07/2017, additional deconditioning due to prolonged hospital stay Bilateral lower extremity weakness, may have lumbar plexopathy related to pelvic fractures is versus radiculopathy. However, patient does not have much in terms of back pain. -continue PT, OT SLP  -continue with therapies to tolerance 2. DVT Prophylaxis/Anticoagulation: Subcutaneous Lovenox. Check vascular study pending 3. Pain Management: Oxycodone 5 mg every 6 hours as needed -fair control 4. Mood: Clonazepam 0.5 mg daily at bedtime, trazodone 50 mg daily at bedtime, Seroquel 100 mg daily at bedtime, Zoloft 50 mg daily,  amantadine 100 mg daily, Provigil 100 mg daily   -continue sleep chart, may start to wean clonazepam tomorrow 5. Neuropsych: This patient iscapable of making decisions on hisown behalf. 6. Skin/Wound Care: Routine skin checks  - continue air mattress due to sensitive skin    7. Fluids/Electrolytes/Nutrition: Routine I&O with follow-up chemistries Has been receiving IV fluids. Recheck basic metabolic package. Improved oral intake, will DC IV fluids if hydration status has improved 8.Nondisplaced fractures of right T7 transverse process. Conservative care 9.Multiple pelvic fractures/bilateral sacroiliac joints and suprapubic symphysis as well as left pelvic sidewall hematoma. Status post sacroiliac screw fixation with external fixation pelvis anterior pelvic ring closed reduction 02/07/2017. May ambulate extended distances weightbearing as tolerated without restrictions. 10.Seizure prophylaxis. Keppra 500 mg every 12 hours. EEG negative 11.Tracheostomy tube 03/01/2017 per Dr. Hulen Skains. Decannulated.   -Need to try pink tape in more compressive dressing to stoma to prevent air leakage 12.GI: Gastrostomy tube 03/01/2017 per Dr. Hulen Skains. Presently not receiving tube feeds.    -belly remains mildly distended. Ileus , resolved, bowel movements without laxatives yesterday  discontinue IV reglan qid  -change diet to mechanical soft  -Continue Senokot S  -Plan removal of PEG this week 13.Urinary retention/urethral injury. Status post suprapubic catheter placement 03/21/2017 per interventional radiology. Follow-up urology services Dr. Louis Meckel  -[facility]  changed cath out 04/27/2017  -100k proteus from cath specimen: empiric bactrim started. UCS shows sensitivity to this antibiotic. Continue 7 day course 14.Acute  blood loss anemia. Follow-up CBC. Continue iron supplement 15. DM 2:   -on CM diet--changed to regular carb, full-liquid for now  -amaryl 2mg ---hold for now , blood sugars reasonably controlled  on sliding scale 04/30/2017   CBG (last 3)  Recent Labs    04/29/17 1631 04/29/17 2057 04/30/17 0749  GLUCAP 162* 211* 83   16. Tachycardia improving  - Likely due to debility   -improved with increased metoprolol  LOS (Days) 15 A FACE TO FACE EVALUATION WAS PERFORMED  Charlett Blake, MD 04/30/2017 10:52 AM

## 2017-04-30 NOTE — Progress Notes (Signed)
Occupational Therapy Session Note  Patient Details  Name: Peter [name] MRN: [id number] Date of Birth: 1960/08/07  Today's Date: 04/30/2017 OT Individual Time: 0907-1000 OT Individual Time Calculation (min): 53 min    Skilled Therapeutic Interventions/Progress Updates:    1;1. Pt requesting to unhook IV, and RN comes in to complete task while OT dons ted hose. Pt supine to sitting EOB with MOD A and slide board transfer EOB>w/c with MOD A and VC for head/hips relationship and hand placement. Pt brushes teeth with Vc to use BUE power grasp to squeeze toothpaste onto toothbrush with increased time. OT rolls w/c to dayroom for time management. Pt learns rule of [place] train dominoes game and plays with forced use of RUE to improve [facility] with mod fading to min question cues for rule following. Exited session iwht pt seated in w/c with sister in room and cal light in reach.  Therapy Documentation Precautions:  Precautions Precautions: Fall Precaution Comments: trach, PEG, catheter  Required Braces or Orthoses: Other Brace/Splint Other Brace/Splint: bil PRAFO, abdominal binder, bil hand splints at night Restrictions Weight Bearing Restrictions: Yes RLE Weight Bearing: Weight bearing as tolerated LLE Weight Bearing: Weight bearing as tolerated Other Position/Activity Restrictions: no ROM restrictions bil LE  See Function Navigator for Current Functional Status.   Therapy/Group: Individual Therapy  Tonny Branch 04/30/2017, 6:55 AM

## 2017-04-30 NOTE — Progress Notes (Signed)
  CRITICAL VALUE ALERT  Critical Value:  K 2.6  Date & Time Notied:  1242  04/30/17  Provider Notified: Dr. Letta Pate  Orders Received/Actions taken: K runs x 3; BMET in am

## 2017-05-01 ENCOUNTER — Inpatient Hospital Stay (HOSPITAL_COMMUNITY): Payer: BLUE CROSS/BLUE SHIELD

## 2017-05-01 ENCOUNTER — Inpatient Hospital Stay (HOSPITAL_COMMUNITY): Payer: BLUE CROSS/BLUE SHIELD | Admitting: Occupational Therapy

## 2017-05-01 ENCOUNTER — Inpatient Hospital Stay (HOSPITAL_COMMUNITY): Payer: BLUE CROSS/BLUE SHIELD | Admitting: Physical Therapy

## 2017-05-01 ENCOUNTER — Inpatient Hospital Stay (HOSPITAL_COMMUNITY): Payer: BLUE CROSS/BLUE SHIELD | Admitting: Speech Pathology

## 2017-05-01 LAB — GLUCOSE, CAPILLARY
GLUCOSE-CAPILLARY: 161 mg/dL — AB (ref 65–99)
GLUCOSE-CAPILLARY: 192 mg/dL — AB (ref 65–99)
GLUCOSE-CAPILLARY: 215 mg/dL — AB (ref 65–99)
GLUCOSE-CAPILLARY: 231 mg/dL — AB (ref 65–99)
Glucose-Capillary: 189 mg/dL — ABNORMAL HIGH (ref 65–99)

## 2017-05-01 LAB — BASIC METABOLIC PANEL
Anion gap: 6 (ref 5–15)
BUN: 10 mg/dL (ref 6–20)
CHLORIDE: 109 mmol/L (ref 101–111)
CO2: 22 mmol/L (ref 22–32)
Calcium: 8.4 mg/dL — ABNORMAL LOW (ref 8.9–10.3)
Creatinine, Ser: 0.72 mg/dL (ref 0.61–1.24)
GFR calc Af Amer: >60 mL/min (ref >60)
GLUCOSE: 197 mg/dL — AB (ref 65–99)
POTASSIUM: 2.9 mmol/L — AB (ref 3.5–5.1)
Sodium: 137 mmol/L (ref 135–145)

## 2017-05-01 MED ORDER — CLONAZEPAM 0.5 MG PO TABS
0.5000 mg | ORAL_TABLET | Freq: Every day | ORAL | Status: DC
  Administered 2017-05-01: 0.5 mg via ORAL
  Filled 2017-05-01: qty 1

## 2017-05-01 MED ORDER — POTASSIUM CHLORIDE CRYS ER 20 MEQ PO TBCR
20.0000 meq | EXTENDED_RELEASE_TABLET | Freq: Two times a day (BID) | ORAL | Status: DC
  Administered 2017-05-01 – 2017-05-19 (×37): 20 meq via ORAL
  Filled 2017-05-01 (×36): qty 1

## 2017-05-01 MED ORDER — GLIMEPIRIDE 2 MG PO TABS
2.0000 mg | ORAL_TABLET | Freq: Every day | ORAL | Status: DC
  Administered 2017-05-02 – 2017-05-19 (×17): 2 mg via ORAL
  Filled 2017-05-01 (×18): qty 1

## 2017-05-01 MED ORDER — ALPRAZOLAM 0.5 MG PO TABS
1.0000 mg | ORAL_TABLET | Freq: Once | ORAL | Status: AC
  Administered 2017-05-01: 1 mg via ORAL
  Filled 2017-05-01: qty 2

## 2017-05-01 NOTE — Progress Notes (Signed)
[place] PHYSICAL MEDICINE & REHABILITATION     PROGRESS NOTE    Subjective/Complaints: Abdominal pain has improved.  Patient feels like he is having more normal bowel movements.  We discussed his lower extremity weakness, based on his CT scans showing the large left pelvic hematoma I would expect his left lower extremity to be much weaker than his right however lower extremity strength is approximately equal.   ROS: pt denies nausea, vomiting, diarrhea, cough, shortness of breath or chest pain     Objective: Vital Signs: Blood pressure (!) 146/82, pulse 92, temperature 97.9 F (36.6 C), temperature source Oral, resp. rate 18, height 5\' 10"  (1.778 m), weight 68 kg (149 lb 14.6 oz), SpO2 99 %. No results found. No results for input(s): WBC, HGB, HCT, PLT in the last 72 hours. Recent Labs    04/30/17 1114  NA 133*  K 2.6*  CL 106  GLUCOSE 171*  BUN 15  CREATININE 0.80  CALCIUM 8.3*   CBG (last 3)  Recent Labs    04/30/17 1627 04/30/17 2126 05/01/17 0620  GLUCAP 205* 206* 161*    Wt Readings from Last 3 Encounters:  04/19/17 68 kg (149 lb 14.6 oz)  03/23/17 74.4 kg (164 lb 0.4 oz)    Physical Exam:  Constitutional: Sitting in bed in no distress HENT:  Head: Normocephalicand atraumatic.  Eyes: Pupils are equal, round, and reactive to light. EOMare normal.  Neck: Normal range of motion. Neck supple. No thyromegalypresent.  Trach stoma dressed with pink tape, no air leakage Cardiovascular: Rate controlled Respiratory: Normal effort, lungs clear GI: Belly remains distended with scarce bowel sounds.  Belly nontender  Skin. Warm and dry, thin Musculoskeletal. Patient with tr edema to the right hand. Palpable pedal pulses lower extremities Genitourinary.  Suprapubic tube in place, urine clear Neurological. Awake and alert. Limited insight and awareness. Follows simple commands.  Oriented to place, person, month, reason he's here. UE Bilateral 4/5 Deltoid, biceps,  triceps and 4 HI. LE: 2- / 5 Bilateral HF, KE, ADF,APFsensatio reduced in bialteral feet with proprio, preserved LT in left foot Motor exam stable There is no evidence of spasticity in the lower extremities Psych: Affect remains flat.   Assessment/Plan: 1. 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 Physiatrist is providing close team supervision and 24 hour management of active medical problems listed below. Physiatrist and rehab team continue to assess barriers to discharge/monitor patient progress toward functional and medical goals.  Function:  Bathing Bathing position   Position: Bed  Bathing parts Body parts bathed by patient: Chest, Abdomen Body parts bathed by helper: Right arm, Right lower leg, Left arm, Left lower leg, Chest, Abdomen, Back, Front perineal area, Buttocks, Right upper leg, Left upper leg  Bathing assist Assist Level: Touching or steadying assistance(Pt > 75%)      Upper Body Dressing/Undressing Upper body dressing   What is the patient wearing?: Hospital gown     Pull over shirt/dress - Perfomed by patient: Thread/unthread left sleeve Pull over shirt/dress - Perfomed by helper: Thread/unthread right sleeve, Thread/unthread left sleeve, Pull shirt over trunk, Put head through opening        Upper body assist Assist Level: (total assist)      Lower Body Dressing/Undressing Lower body dressing   What is the patient wearing?: Pants, Non-skid slipper socks       Pants- Performed by helper: Thread/unthread right pants  leg, Thread/unthread left pants leg, Pull pants up/down Non-skid slipper socks- Performed by patient: Don/doff right sock, Don/doff left sock Non-skid slipper socks- Performed by helper: Don/doff right sock, Don/doff left sock                  Lower body assist Assist for lower body dressing: (Total A)       Toileting Toileting   Toileting steps completed by patient: Adjust clothing prior to toileting, Adjust clothing after toileting Toileting steps completed by helper: Adjust clothing prior to toileting, Performs perineal hygiene, Adjust clothing after toileting    Toileting assist Assist level: Two helpers   Transfers Chair/bed transfer   Chair/bed transfer method: Lateral scoot Chair/bed transfer assist level: dependent (Pt equals 0%) Chair/bed transfer assistive device: Mechanical lift Mechanical lift: Stedy   Locomotion Ambulation Ambulation activity did not occur: Safety/medical concerns         Wheelchair   Type: Manual Max wheelchair distance: 30 Assist Level: Touching or steadying assistance (Pt > 75%)  Cognition Comprehension Comprehension assist level: Understands complex 90% of the time/cues 10% of the time  Expression Expression assist level: Expresses basic 90% of the time/requires cueing < 10% of the time.  Social Interaction Social Interaction assist level: Interacts appropriately 90% of the time - Needs monitoring or encouragement for participation or interaction.  Problem Solving Problem solving assist level: Solves basic 75 - 89% of the time/requires cueing 10 - 24% of the time  Memory Memory assist level: Recognizes or recalls 75 - 89% of the time/requires cueing 10 - 24% of the time   Medical Problem List and Plan: 1. Decreased functional mobility with cognitive deficitssecondary to TBI/polytrauma 02/07/2017, additional deconditioning due to prolonged hospital stay Bilateral lower extremity weakness, may have lumbar plexopathy related to pelvic fractures is versus radiculopathy. However, patient does not have much in terms of back pain. Check MRI of the lumbar spine to further evaluate lower extremity weakness. -continue PT, OT SLP  -continue with therapies to tolerance 2. DVT Prophylaxis/Anticoagulation: Subcutaneous Lovenox. Check vascular  study pending 3. Pain Management: Oxycodone 5 mg every 6 hours as needed -fair control 4. Mood: Clonazepam 0.5 mg daily at bedtime, trazodone 50 mg daily at bedtime, Seroquel 100 mg daily at bedtime, Zoloft 50 mg daily, amantadine 100 mg daily, Provigil 100 mg daily   -continue sleep chart, may start to wean clonazepam today 5. Neuropsych: This patient iscapable of making decisions on hisown behalf. 6. Skin/Wound Care: Routine skin checks  - continue air mattress due to sensitive skin    7. Fluids/Electrolytes/Nutrition: Routine I&O with follow-up chemistries Has been receiving IV fluids. Recheck basic metabolic package.  Potassium improving after K riders x3 however still needs oral supplementation.  8.Nondisplaced fractures of right T7 transverse process. Conservative care 9.Multiple pelvic fractures/bilateral sacroiliac joints and suprapubic symphysis as well as left pelvic sidewall hematoma. Status post sacroiliac screw fixation with external fixation pelvis anterior pelvic ring closed reduction 02/07/2017. May ambulate extended distances weightbearing as tolerated without restrictions. 10.Seizure prophylaxis. Keppra 500 mg every 12 hours. EEG negative 11.Tracheostomy tube 03/01/2017 per Dr. Hulen Skains. Decannulated.   -Need to try pink tape in more compressive dressing to stoma to prevent air leakage 12.GI: Gastrostomy tube 03/01/2017 per Dr. Hulen Skains. Presently not receiving tube feeds.    -belly remains mildly distended. Ileus , resolved, bowel movements without laxatives yesterday  discontinue IV reglan qid  -change diet to mechanical soft  -Continue Senokot S  -Plan removal of PEG this week  13.Urinary retention/urethral injury. Status post suprapubic catheter placement 03/21/2017 per interventional radiology. Follow-up urology services Dr. Louis Meckel  -[facility]  changed cath out 04/27/2017  -100k proteus from cath specimen: empiric bactrim started. UCS shows sensitivity to this  antibiotic. Continue 7 day course 14.Acute blood loss anemia. Follow-up CBC. Continue iron supplement 15. DM 2:   -on CM diet--changed to regular carb, full-liquid for now  -amaryl 2mg ---hold for now , blood sugars reasonably controlled on sliding scale 04/30/2017 Appetite improved blood sugars climbing will treat more aggressively now that p.o. intakes improved   CBG (last 3)  Recent Labs    04/30/17 1627 04/30/17 2126 05/01/17 0620  GLUCAP 205* 206* 161*   16. Tachycardia improving  - Likely due to debility   -improved with increased metoprolol  LOS (Days) 16 A FACE TO FACE EVALUATION WAS PERFORMED  Charlett Blake, MD 05/01/2017 8:34 AM

## 2017-05-01 NOTE — Progress Notes (Signed)
Physical Therapy Weekly Progress Note  Patient Details  Name: Peter [name] MRN: [id number] Date of Birth: March 15, 1961  Beginning of progress report period: April 24, 2017 End of progress report period: May 01, 2017  Today's Date: 05/01/2017 PT Individual Time: 0900-1015 PT Individual Time Calculation (min): 75 min   Patient has met 1 of 4 short term goals.  Patient demonstrating slow progress towards goals; progress delayed by GI problems at end of last week limiting participation in therapy sessions. Currently requires modA for bed mobility, min/modA for slideboard transfers to level surfaces, maxA sit <>stand with lift equipment (stedy, eva walker), S to minA for w/c propulsion d/t strength deficits with increased assist needed as he fatigues. Gait training not initiated at this time d/t significant strength/coordination deficits however plan to initiate when appropriate as LE strength and endurance improves. Pt remains hard working and motivated in all therapy sessions.   Patient continues to demonstrate the following deficits muscle weakness and muscle paralysis, decreased cardiorespiratoy endurance, impaired timing and sequencing, unbalanced muscle activation and decreased coordination and decreased sitting balance, decreased standing balance, decreased postural control and decreased balance strategies and therefore will continue to benefit from skilled PT intervention to increase functional independence with mobility.  Patient progressing slowly towards long term goals.  Continue plan of care.  PT Short Term Goals Week 2:  PT Short Term Goal 1 (Week 2): Pt will perform bed mobility with minA PT Short Term Goal 1 - Progress (Week 2): Progressing toward goal PT Short Term Goal 2 (Week 2): Pt will perform transfers with consistent minA using slideboard PT Short Term Goal 2 - Progress (Week 2): Progressing toward goal PT Short Term Goal 3 (Week 2): Pt will initiate gait  training PT Short Term Goal 3 - Progress (Week 2): Not met PT Short Term Goal 4 (Week 2): Pt will propel w/c x75' with S PT Short Term Goal 4 - Progress (Week 2): Met Week 3:  PT Short Term Goal 1 (Week 3): pt will perform bed mobility with minA PT Short Term Goal 2 (Week 3): Pt will perform slide board transfers with consistent minA PT Short Term Goal 3 (Week 3): Pt will demonstrate w/c propulsion x100' with consistent S PT Short Term Goal 4 (Week 3): Pt will demonstrate sit <>stand with modA  Skilled Therapeutic Interventions/Progress Updates: Tx 1: pt received supine in bed, denies pain and agreeable to treatment. B TEDs, ace wraps and non-skid socks donned totalA. Rolling R/L minA to pull up shorts. Supine>sit modA for trunk management. Sit <>stand from elevated bed with stedy mod/maxA. Sit <>stand x4 trials from stedy seat with modA; demonstrates standing tolerance up to 1 min at a time, cues for L weight shift for neutral pelvis alignment. Theraband donned to B w/c rims to improve grip; pt then able to propel 100' with BUE for strengthening and endurance with occasional cues for technique for turning and steering. Transfer w/c <>mat table with transfer board and modA, cues for head/hips relationship. Sitting BUE strengthening/endurance with ball throws and semi-reclined situps between each throw for core strengthening and endurance. Activity progress to elevated mat table without LE support for increased balance challenge. Sit <>supine on mat table modA. Supine pec stretch BUE x2 min each side; educated pt and sister on goal of chest stretch to increase UE ROM and reduce discomfort in neck/shoulders. Returned to room totalA in w/c; remained seated in w/c with sister present to wash hair at sink, all needs in reach.  Tx 2: Pt received supine in bed, reports "groggy" from medication for MRI however no pain and agreeable to treatment. ModA bed mobility to sit on EOB. Sit <>stand in stedy maxA from  elevated bed, mod/maxA from stedy seat x5 reps before returning to sitting on EOB for rest break. Standing tolerance 10-30 sec per trial, and performed 1 set of 3 sit <>stand in a row. Performed second trial as above x5 reps from stedy seat. Sit >supine modA for LE management. Bridging to scoot toward [place]. Discussed recommendation for ramp at home; wife reports church able to build it "in a weekend" as soon as the request it. Encouraged family to talk with church as soon as possible d/t upcoming d/c. Remained supine in bed at end of session, all needs in reach.       Therapy Documentation Precautions:  Precautions Precautions: Fall Precaution Comments: trach, PEG, catheter  Required Braces or Orthoses: Other Brace/Splint Other Brace/Splint: bil PRAFO, abdominal binder, bil hand splints at night Restrictions Weight Bearing Restrictions: Yes RLE Weight Bearing: Weight bearing as tolerated LLE Weight Bearing: Weight bearing as tolerated Other Position/Activity Restrictions: no ROM restrictions bil LE   See Function Navigator for Current Functional Status.  Therapy/Group: Individual Therapy  Peter [name] 05/01/2017, 10:36 AM

## 2017-05-01 NOTE — Progress Notes (Signed)
SLP Cancellation Note  Patient Details Name: Peter [name] MRN: [id number] DOB: 07-05-1960   Cancelled treatment:        Pt missed 30 minutes of skilled ST because pt was in MRI.                                                                                                 Matyas Baisley 05/01/2017, 11:45 AM

## 2017-05-01 NOTE — Progress Notes (Signed)
Occupational Therapy Session Note  Patient Details  Name: Peter [name] MRN: [id number] Date of Birth: 03-30-1961  Today's Date: 05/01/2017 OT Individual Time: 2979-8921 OT Individual Time Calculation (min): 48 min    Short Term Goals: Week 2:  OT Short Term Goal 1 (Week 2): Pt will complete LB dressing at sit<stand level with LRAD OT Short Term Goal 2 (Week 2): Pt will perform squat pivot transfer to [facility] with max A OT Short Term Goal 3 (Week 2): Pt will perform UB dressing tasks with mod A OT Short Term Goal 4 (Week 2): Pt will perform LB dressing tasks with max A sit<>stand.  Skilled Therapeutic Interventions/Progress Updates:    Treatment session focused on ADLs/self care training, pt/caregiver education, and bed mobility training. Upon entering room, pt out at MRI. OT reported back with pt at 13:30 and pt arrived in bed and had not eaten lunch. Pt had received xanax prior to MRI and was noted to be experiencing side effects of lethargy and weakness. He was repositioned in bed to eat lunch. Therapist discussed with pt and family about environment and living situation at home. Family very supportive of pt and wants him to do the best. Pt agreed to re-dress in clothes from this AM while in bed. Pt required max A to loop B pant legs, completed bed mob L<>R x 2 with min A using bedrails and v/c. Pt moved from supine to EOB sit with mod A for UB/LB support. Once EOB, pt required close guard assistance due to fatigue. Pt donned shirt with mod A and difficulty with threading R UE and pulling shirt over head. Pt and therapist set up for SBT to w/c. After 1 attempt, pt noted to be too fatigued to perform transfer. CNA staff called to assist to return to supine in bed. Pt repositioned in bed with max A with HOB elevated and lunch tray in front to resume meal_0 . Pt left with needs met and family present.   Therapy Documentation Precautions:  Precautions Precautions: Fall Precaution Comments:  trach, PEG, catheter  Required Braces or Orthoses: Other Brace/Splint Other Brace/Splint: bil PRAFO, abdominal binder, bil hand splints at night Restrictions Weight Bearing Restrictions: Yes RLE Weight Bearing: Weight bearing as tolerated LLE Weight Bearing: Weight bearing as tolerated Other Position/Activity Restrictions: no ROM restrictions bil LE General: General OT Amount of Missed Time: 27 Minutes Vital Signs: Therapy Vitals Temp: 98.2 F (36.8 C) Temp Source: Oral Pulse Rate: 79 Resp: 18 BP: (!) 163/86 Patient Position (if appropriate): Lying Oxygen Therapy SpO2: 100 % O2 Device: Not Delivered Pain: Pain Assessment Pain Assessment: No/denies pain ADL: ADL ADL Comments: Please see functional navigator for ADL status  See Function Navigator for Current Functional Status.   Therapy/Group: Individual Therapy  Delon Sacramento 05/01/2017, 3:16 PM

## 2017-05-02 ENCOUNTER — Encounter (HOSPITAL_COMMUNITY): Payer: BLUE CROSS/BLUE SHIELD | Admitting: Psychology

## 2017-05-02 ENCOUNTER — Inpatient Hospital Stay (HOSPITAL_COMMUNITY): Payer: BLUE CROSS/BLUE SHIELD | Admitting: Occupational Therapy

## 2017-05-02 ENCOUNTER — Inpatient Hospital Stay (HOSPITAL_COMMUNITY): Payer: BLUE CROSS/BLUE SHIELD | Admitting: Physical Therapy

## 2017-05-02 ENCOUNTER — Inpatient Hospital Stay (HOSPITAL_COMMUNITY): Payer: BLUE CROSS/BLUE SHIELD

## 2017-05-02 LAB — BASIC METABOLIC PANEL
ANION GAP: 7 (ref 5–15)
BUN: 12 mg/dL (ref 6–20)
CALCIUM: 8.8 mg/dL — AB (ref 8.9–10.3)
CO2: 22 mmol/L (ref 22–32)
Chloride: 110 mmol/L (ref 101–111)
Creatinine, Ser: 0.72 mg/dL (ref 0.61–1.24)
GFR calc Af Amer: >60 mL/min (ref >60)
Glucose, Bld: 146 mg/dL — ABNORMAL HIGH (ref 65–99)
POTASSIUM: 3.4 mmol/L — AB (ref 3.5–5.1)
SODIUM: 139 mmol/L (ref 135–145)

## 2017-05-02 LAB — GLUCOSE, CAPILLARY
GLUCOSE-CAPILLARY: 146 mg/dL — AB (ref 65–99)
GLUCOSE-CAPILLARY: 170 mg/dL — AB (ref 65–99)
Glucose-Capillary: 181 mg/dL — ABNORMAL HIGH (ref 65–99)
Glucose-Capillary: 179 mg/dL — ABNORMAL HIGH (ref 65–99)

## 2017-05-02 MED ORDER — CLONAZEPAM 0.5 MG PO TABS
0.2500 mg | ORAL_TABLET | Freq: Every day | ORAL | Status: DC
  Administered 2017-05-02 – 2017-05-09 (×8): 0.25 mg via ORAL
  Filled 2017-05-02 (×8): qty 1

## 2017-05-02 NOTE — Patient Care Conference (Signed)
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 Chest trauma   . Fracture   . Multiple rib fractures involving four or more ribs   . Pelvic ring fracture, sequela   . Respiratory failure ([place])   . 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[place])   . Post-operative pain   . Acute blood loss anemia   . Diabetes mellitus type 2 in nonobese ([facility])   . Hyperlipidemia   . Tachypnea   . Secondary hypertension   . Hyperkalemia   . Pressure injury of skin 02/28/2017  . Multiple closed anterior-posterior compression fractures of pelvis ([place]) 02/08/2017  . Multiple fractures of ribs, bilateral, initial encounter for closed fracture 02/07/2017    Expected Discharge Date: Expected Discharge Date: 05/17/17  Team Members Present: Physician leading conference: Dr. Delice Lesch Social Worker Present: Lennart Pall, LCSW Nurse Present: Arelia Sneddon, RN PT Present: Kem Parkinson, PT OT Present: Willeen Cass, OT SLP Present: Charolett Bumpers, SLP PPS Coordinator present : Daiva Nakayama, RN, CRRN     Current Status/Progress Goal Weekly Team Focus  Medical   Trach stoma closed, ileus resolving, plan on PEG removal this week   Improve lower extremity strength, improved bowel and bladder function  PEG removal   Bowel/Bladder   incontinent of bowel, last BM 11/13. Pt. has suprapubic catheter.  Regain continence.  Monitor for bowel pattern.   Swallow/Nutrition/ Hydration             ADL's   UB BADLs - mod A, LB BADLs - total A.  static sit balance S, dynamic sit balance mod A, max A slide board, limited activity tolerance  min A overall (goals may need to be downgraded)  family education, trunk control, BUE ROM and strength, ADL retraining,  activity tolerance   Mobility   modA bed mobility and min/modA transfers with slideboard, maxA sit <>stand with stedy, eva walker, gait NT  min assist w/c level; supervision w/c mobility; mod assist controlled environment gait goal  activity tolerance, LE/core strengthening, transfers, standing tolerance   Communication             Safety/Cognition/ Behavioral Observations  Min-supervision  supervision  Recall, semi-complex problem solving, awareness and selective attention   Pain             Skin   Scattered abrasions, left groin, abdomen, thigh and arm. Xeroform to penile area from abrasion.   No new skin breakdown while in rehab.  Assess skin q shift and prn.    Rehab Goals Patient on target to meet rehab goals: Yes Rehab Goals Revised: none *See Care Plan and progress  notes for long and short-term goals.     Barriers to Discharge  Current Status/Progress Possible Resolutions Date Resolved   Physician    Medical stability;Nutrition means  Ileus caused the PEG removal to be delayed  Progressing towards goals  Continue rehab, PEG removal this week      Nursing                  PT                    OT                  SLP                SW                Discharge Planning/Teaching Needs:  Plan for d/c home with wife and family to provide 24/7 assistance.  Teaching has been ongoing with wife   Team Discussion:  Weaning anxiety meds;  Plan to remove peg  this week;  Ileus improved.  Lost time with txs last week due to GI issues;  Max assist to stand.  Min-mod assist with tfs.  Having orthostasis with standing.  Able to set EOB with supervision.  Poor endurance.  Seems to be improving cognitively.  Goals mod assist gait and min assist ADLs.  Revisions to Treatment Plan:  Team recommends extension of stay to 11/28    Continued Need for Acute Rehabilitation Level of Care: The patient requires daily medical management by a physician with specialized training in physical medicine and rehabilitation for the following conditions: Daily direction of a multidisciplinary physical rehabilitation program to ensure safe treatment while eliciting the highest outcome that is of practical value to the patient.: Yes Daily medical management of patient stability for increased activity during participation in an intensive rehabilitation regime.: Yes Daily analysis of laboratory values and/or radiology reports with any subsequent need for medication adjustment of medical intervention for : Neurological problems;Post surgical problems  [place]ont Skalicky 05/02/2017, 2:44 PM

## 2017-05-02 NOTE — Progress Notes (Signed)
Social Work Patient ID: Peter [name], male   DOB: 1960/11/09, 56 y.o.   MRN: [id number]   Met with pt and wife this afternoon to review team conference.  Both understand that team has recommended an extension of his CIR stay due to limited progress last week because of medical issues.  They are aware and agreeable with new targeted d/c date of 11/28 and continue to work toward min/ mod assist goals.  Will continue to follow.  Jess Toney, LCSW

## 2017-05-02 NOTE — Progress Notes (Signed)
Physical Therapy Session Note  Patient Details  Name: Peter [name] MRN: [id number] Date of Birth: [birth year]  Today's Date: 05/02/2017 PT Individual Time: 0930-1000 PT Individual Time Calculation (min): 30 min   Skilled Therapeutic Interventions/Progress Updates: Pt presented with OT at [place]. OT handed off to PTA and transferred to w/c. Pt required modA in [place] to standing and was able to maintain standing approx 10 sec before requiring seated rest in w/c. Pt participated in w/c propulsion total of 175ft with x 3 rests. Pt instructed in increased elbow flexion to increase propulsion distance. Pt then participated in obstacle course with cones for object negotiation and focus on turns. Pt remained in rehab gym at end of session to await next Pt session.       Therapy Documentation Precautions:  Precautions Precautions: Fall Precaution Comments: trach, PEG, catheter  Required Braces or Orthoses: Other Brace/Splint Other Brace/Splint: bil PRAFO, abdominal binder, bil hand splints at night Restrictions Weight Bearing Restrictions: Yes RLE Weight Bearing: Weight bearing as tolerated LLE Weight Bearing: Weight bearing as tolerated Other Position/Activity Restrictions: no ROM restrictions bil LE General:   Vital Signs: Therapy Vitals Temp: (!) 97.5 F (36.4 C) Temp Source: Oral Pulse Rate: 78 Resp: 17 BP: 138/85 Patient Position (if appropriate): Sitting Oxygen Therapy SpO2: 100 % O2 Device: Not Delivered Pain: Pain Assessment Pain Assessment: No/denies pain   See Function Navigator for Current Functional Status.   Therapy/Group: Individual Therapy  Dravon Nott  Monique Hefty, PTA  05/02/2017, 4:28 PM

## 2017-05-02 NOTE — Progress Notes (Signed)
Physical Therapy Session Note  Patient Details  Name: Peter [name] MRN: [id number] Date of Birth: [birth year]  Today's Date: 05/02/2017 PT Individual Time: 1000-1100 and 1400-1500 PT Individual Time Calculation (min): 60 min and 60 min (total 120 min)   Short Term Goals: Week 3:  PT Short Term Goal 1 (Week 3): pt will perform bed mobility with minA PT Short Term Goal 2 (Week 3): Pt will perform slide board transfers with consistent minA PT Short Term Goal 3 (Week 3): Pt will demonstrate w/c propulsion x100' with consistent S PT Short Term Goal 4 (Week 3): Pt will demonstrate sit <>stand with modA  Skilled Therapeutic Interventions/Progress Updates: Tx 1: Pt received seated in w/c with handoff from previous therapist, denies pain and agreeable to treatment. Sit <>stand in parallel bars with mod/maxA from w/c surface and from airex pad on top of w/c cushion to elevate seat height. Standing tolerance 20-30 sec, performing static stance and weight shifts R/L. Tactile/verbal cueing for glute activation, trunk/hip extension, and use of mirror for feedback to assist with alignment and upright posture. Performed x5 reps standing total with prolonged seated rest breaks between d/t fatigue. Slideboard transfer w/c <>nustep with minA. Performed BUE/BLE nustep x8 min with LLE positioning assist, cues to maintain speed >30 steps/min. Pt reports stiffness in L knee with flexion, improved ROM with repetition. Returned to room totalA; remained seated in w/c at end of session, wife present and all needs in reach.   Tx 2: Pt received seated in w/c, denies pain and agreeable to treatment. Pt reports he "did some leg lifts" when asked about pressure relief; provided handouts with pictures of lateral leans, forward leans and w/c pushups and reinforced importance of pressure relief when up in chair for extended periods of time. Discussed that w/c pushups likely not good enough for sufficient pressure relief at this  time, but may continue practicing for UE strengthening. Transfer w/c <>mat table minA. Sit <>stand x5 reps total, mod/maxA +2 with RW, dycem on handles to improve grip. Standing weight shifts R/L with modA. Attempted LLE forward step x2 trials, unable to take normal step length however is able to unweigh LLE temporarily before posterior LOB. Returned to w/c as above. Transfer w/c>bed minA. Sit >supine modA for LE management. Remained supine in bed, all needs in reach and wife present.      Therapy Documentation Precautions:  Precautions Precautions: Fall Precaution Comments: trach, PEG, catheter  Required Braces or Orthoses: Other Brace/Splint Other Brace/Splint: bil PRAFO, abdominal binder, bil hand splints at night Restrictions Weight Bearing Restrictions: Yes RLE Weight Bearing: Weight bearing as tolerated LLE Weight Bearing: Weight bearing as tolerated Other Position/Activity Restrictions: no ROM restrictions bil LE Pain: Pain Assessment Pain Assessment: No/denies pain   See Function Navigator for Current Functional Status.   Therapy/Group: Individual Therapy  Luberta Mutter 05/02/2017, 12:07 PM

## 2017-05-02 NOTE — Progress Notes (Signed)
Speech Language Pathology Weekly Progress and Session Note  Patient Details  Name: Peter [name] MRN: [id number] Date of Birth: 11-03-1960  Beginning of progress report period: April 25, 2017 End of progress report period: May 02, 2017  Today's Date: 05/02/2017 SLP Individual Time: 1130-1200 SLP Individual Time Calculation (min): 30 min  Short Term Goals: Week 2: SLP Short Term Goal 1 (Week 2): Pt will utilize external memory aids to recall new daily information with supervision cues.  SLP Short Term Goal 1 - Progress (Week 2): Met SLP Short Term Goal 2 (Week 2): Pt will selective attention for ~ 30 minutes with Min A cues.  SLP Short Term Goal 2 - Progress (Week 2): Not met SLP Short Term Goal 3 (Week 2): Pt will demonstrate intellectual awarness by identifying 3 physcial and 3 cognitive deficits with Min A cues.  SLP Short Term Goal 3 - Progress (Week 2): Met SLP Short Term Goal 4 (Week 2): Pt will complete mildly complex problems with Min A cues.  SLP Short Term Goal 4 - Progress (Week 2): Met    New Short Term Goals: Week 3: SLP Short Term Goal 1 (Week 3): Pt will recall new, daily information with Min A verbal and visual cues.  SLP Short Term Goal 2 (Week 3): Pt will selective attention for ~ 30 minutes with Min A cues.  SLP Short Term Goal 3 (Week 3): Pt will self-monitor and correct errors during functional tasks with Min A cues.  SLP Short Term Goal 4 (Week 3): Pt will complete mildly complex problems with supervision A verbal and question cues.   Weekly Progress Updates:Pt demonstrated ability to met 3 out of 4 goals, progress impede this week due to medical issues. SLP will continue to address selective attention and upgraded goals in awareness, recall and problem solving. Pt continues to benefit from skilled ST services in order to maximize functional independence and reduce burden of care.    Intensity: Minumum of 1-2 x/day, 30 to 90 minutes Frequency: Total of  15 hours over 7 days of combined therapies Duration/Length of Stay: 11/28 Treatment/Interventions: Cognitive remediation/compensation;Patient/family education;Medication managment;Internal/external aids;Environmental Environmental consultant   Daily Session  Skilled Therapeutic Interventions: Skilled ST services focused on cognitive skills. SLP facilitated error awareness, sustained attention and semi-complex problem solving utilizing medication management tasks, pt required min-supervision A verbal and visual cues in problem solving and error awareness and min-mod physical assistance. SLP and pt were unable to finish task due to time. Pt was left in room with wife. Recommend to continue skilled ST services.   Function:   Eating Eating                 Cognition Comprehension Comprehension assist level: Understands complex 90% of the time/cues 10% of the time  Expression   Expression assist level: Expresses complex 90% of the time/cues < 10% of the time  Social Interaction Social Interaction assist level: Interacts appropriately 90% of the time - Needs monitoring or encouragement for participation or interaction.  Problem Solving Problem solving assist level: Solves basic 75 - 89% of the time/requires cueing 10 - 24% of the time  Memory Memory assist level: Recognizes or recalls 75 - 89% of the time/requires cueing 10 - 24% of the time   General    Pain Pain Assessment Pain Assessment: No/denies pain  Therapy/Group: Individual Therapy  Fritz Cauthon  [facility] 05/02/2017, 3:55 PM

## 2017-05-02 NOTE — Progress Notes (Signed)
Occupational Therapy Session Note  Patient Details  Name: Peter [name] MRN: [id number] Date of Birth: 1961/01/14  Today's Date: 05/02/2017 OT Individual Time: 5003-7048 OT Individual Time Calculation (min): 56 min    Short Term Goals: Week 2:  OT Short Term Goal 1 (Week 2): Pt will complete LB dressing at sit<stand level with LRAD OT Short Term Goal 2 (Week 2): Pt will perform squat pivot transfer to [facility] with max A OT Short Term Goal 3 (Week 2): Pt will perform UB dressing tasks with mod A OT Short Term Goal 4 (Week 2): Pt will perform LB dressing tasks with max A sit<>stand.  Skilled Therapeutic Interventions/Progress Updates:    Pt received in bed in a lethargic state. He stated he slept well but felt very tired this am.  Spent the first half of the session working on PROM and A/AROM for BUE to increase external rotation and sh flexion along with wrist and finger ROM.  Educated his wife who was present on how to continue these exercises for later this evening. Pt stated he had on a clean shirt but did need to have his brief changed as he had just had a BM.  He was able to roll with min A Pt sat to EOB with mod A first rolling to R side with min A. Sat unsupported at EOB and worked on actively trying to lift feet to place into pants.  He was able to lift his R foot a few inches off the floor but could not move the L foot enough to clear the floor.  Placed pt's feet in the steady lift.  Attempted 2x to have pt stand to steady with max A, but he was unable to stating that he just felt very weak today.  Pt's PT arrived for his next session and with +2 A he stood up into a fully upright position. Assisted with pulling his pants up and PT took over session.    Therapy Documentation Precautions:  Precautions Precautions: Fall Precaution Comments: trach, PEG, catheter  Required Braces or Orthoses: Other Brace/Splint Other Brace/Splint: bil PRAFO, abdominal binder, bil hand splints at  night Restrictions Weight Bearing Restrictions: Yes RLE Weight Bearing: Weight bearing as tolerated LLE Weight Bearing: Weight bearing as tolerated Other Position/Activity Restrictions: no ROM restrictions bil LE     Pain: Pain Assessment Pain Assessment: No/denies pain ADL: ADL ADL Comments: Please see functional navigator for ADL status  See Function Navigator for Current Functional Status.   Therapy/Group: Individual Therapy  Iona Stay 05/02/2017, 10:14 AM

## 2017-05-02 NOTE — Progress Notes (Signed)
[place] PHYSICAL MEDICINE & REHABILITATION     PROGRESS NOTE    Subjective/Complaints: MRI neg for cauda equina compression   ROS: pt denies nausea, vomiting, diarrhea, cough, shortness of breath or chest pain     Objective: Vital Signs: Blood pressure (!) 145/76, pulse 86, temperature 98.4 F (36.9 C), temperature source Oral, resp. rate 16, height '5\' 10"'  (1.778 m), weight 68 kg (149 lb 14.6 oz), SpO2 100 %. Mr Lumbar Spine Wo Contrast  Result Date: 05/01/2017 CLINICAL DATA:  Lumbar radiculopathy. History of poly trauma 02/07/2017. Sacral fracture with surgical fixation. EXAM: MRI LUMBAR SPINE WITHOUT CONTRAST TECHNIQUE: Multiplanar, multisequence MR imaging of the lumbar spine was performed. No intravenous contrast was administered. COMPARISON:  CT abdomen pelvis 03/06/2017 FINDINGS: Segmentation:  Standard Alignment:  Normal Vertebrae: Negative for lumbar fracture. Sacral fracture with transverse fixation hardware causing artifact over the sacrum. Conus medullaris: Extends to the L2 level and appears normal. Paraspinal and other soft tissues: Moderate left hydronephrosis with debris in the renal pelvis. Left ureter dilated as noted on recent CT. Nonobstructed right kidney. No retroperitoneal mass Disc levels: L1-2:  Negative L2-3:  Negative L3-4:  Mild facet degeneration without spinal stenosis L4-5: Mild disc space narrowing. Left-sided disc protrusion with subarticular stenosis. Impingement of the left L5 nerve root. Spinal canal adequate in size. Mild facet hypertrophy L5-S1: Negative IMPRESSION: Shallow left-sided disc protrusion L4-5 with impingement left L5 nerve root. Otherwise no significant lumbar stenosis Sacral fracture with transverse screw fixation. Electronically Signed   By: Franchot Gallo M.D.   On: 05/01/2017 13:44   No results for input(s): WBC, HGB, HCT, PLT in the last 72 hours. Recent Labs    05/01/17 0836 05/02/17 0537  NA 137 139  K 2.9* 3.4*  CL 109 110   GLUCOSE 197* 146*  BUN 10 12  CREATININE 0.72 0.72  CALCIUM 8.4* 8.8*   CBG (last 3)  Recent Labs    05/01/17 1847 05/01/17 2103 05/02/17 0643  GLUCAP 189* 231* 146*    Wt Readings from Last 3 Encounters:  04/19/17 68 kg (149 lb 14.6 oz)  03/23/17 74.4 kg (164 lb 0.4 oz)    Physical Exam:  Constitutional: Sitting in bed in no distress HENT:  Head: Normocephalicand atraumatic.  Eyes: Pupils are equal, round, and reactive to light. EOMare normal.  Neck: Normal range of motion. Neck supple. No thyromegalypresent.  Trach stoma dressed with pink tape, no air leakage Cardiovascular: Rate controlled Respiratory: Normal effort, lungs clear GI: Belly remains distended with scarce bowel sounds.  Belly nontender  Skin. Warm and dry, thin Musculoskeletal. Patient with tr edema to the right hand. Palpable pedal pulses lower extremities Genitourinary.  Suprapubic tube in place, urine clear Neurological. Awake and alert. Limited insight and awareness. Follows simple commands.  Oriented to place, person, month, reason he's here. UE Bilateral 4/5 Deltoid, biceps, triceps and 4 HI. LE: 2- / 5 Bilateral HF, KE, ADF,APFsensatio reduced in bialteral feet with proprio, preserved LT in left foot Motor exam stable There is no evidence of spasticity in the lower extremities Psych: Affect remains flat.   Assessment/Plan: 1. 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 Physiatrist is providing close team supervision and 24 hour management of active medical problems listed below. Physiatrist and rehab team continue to assess barriers to discharge/monitor patient progress toward functional and medical goals.  Function:  Bathing Bathing position   Position:  Bed  Bathing parts Body parts bathed by patient: Chest, Abdomen Body parts bathed by helper: Right arm, Right  lower leg, Left arm, Left lower leg, Chest, Abdomen, Back, Front perineal area, Buttocks, Right upper leg, Left upper leg  Bathing assist Assist Level: Touching or steadying assistance(Pt > 75%)      Upper Body Dressing/Undressing Upper body dressing   What is the patient wearing?: Pull over shirt/dress     Pull over shirt/dress - Perfomed by patient: Thread/unthread left sleeve Pull over shirt/dress - Perfomed by helper: Thread/unthread right sleeve, Pull shirt over trunk, Put head through opening        Upper body assist Assist Level: Touching or steadying assistance(Pt > 75%)      Lower Body Dressing/Undressing Lower body dressing   What is the patient wearing?: Pants       Pants- Performed by helper: Thread/unthread right pants leg, Thread/unthread left pants leg, Pull pants up/down Non-skid slipper socks- Performed by patient: Don/doff right sock, Don/doff left sock Non-skid slipper socks- Performed by helper: Don/doff right sock, Don/doff left sock                  Lower body assist Assist for lower body dressing: Touching or steadying assistance (Pt > 75%)      Toileting Toileting   Toileting steps completed by patient: Adjust clothing prior to toileting, Adjust clothing after toileting Toileting steps completed by helper: Adjust clothing prior to toileting, Performs perineal hygiene, Adjust clothing after toileting    Toileting assist Assist level: Two helpers   Transfers Chair/bed transfer   Chair/bed transfer method: Lateral scoot Chair/bed transfer assist level: Moderate assist (Pt 50 - 74%/lift or lower) Chair/bed transfer assistive device: Armrests, Sliding board Mechanical lift: Stedy   Locomotion Ambulation Ambulation activity did not occur: Safety/medical concerns         Wheelchair   Type: Manual Max wheelchair distance: 100 Assist Level: Touching or steadying assistance (Pt > 75%)  Cognition Comprehension Comprehension assist level:  Understands complex 90% of the time/cues 10% of the time  Expression Expression assist level: Expresses complex 90% of the time/cues < 10% of the time  Social Interaction Social Interaction assist level: Interacts appropriately 90% of the time - Needs monitoring or encouragement for participation or interaction.  Problem Solving Problem solving assist level: Solves basic 75 - 89% of the time/requires cueing 10 - 24% of the time  Memory Memory assist level: Recognizes or recalls 75 - 89% of the time/requires cueing 10 - 24% of the time   Medical Problem List and Plan: 1. 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 Formulized individual treatment plan based on medical history, underlying problem and comorbidities. 2. DVT Prophylaxis/Anticoagulation: Subcutaneous Lovenox. Check vascular study pending 3. Pain Management: Oxycodone 5 mg every 6 hours as needed -fair control 4. Mood: reduce Clonazepam 0.25 mg daily at bedtime, trazodone 50 mg daily at bedtime, Seroquel 100 mg daily at bedtime, Zoloft 50 mg daily, amantadine 100 mg daily, Provigil 100 mg daily   -continue sleep chart,5. Neuropsych: This patient iscapable of making decisions on hisown behalf. 6. Skin/Wound Care: Routine skin checks  - continue air mattress due to sensitive skin    7. Fluids/Electrolytes/Nutrition: Routine I&O with follow-up chemistries  Has been receiving IV fluids. Recheck basic metabolic package.  Potassium improving after K riders x3and oral supplementation.  8.Nondisplaced fractures of right T7 transverse process. Conservative care 9.Multiple pelvic  fractures/bilateral sacroiliac joints and suprapubic symphysis as well as left pelvic sidewall hematoma. Status post sacroiliac screw fixation with external fixation pelvis anterior pelvic ring closed reduction 02/07/2017. May ambulate extended distances weightbearing as tolerated without restrictions. 10.Seizure prophylaxis. Keppra 500 mg every 12 hours. EEG negative 11.Tracheostomy tube 03/01/2017 per Dr. Hulen Skains. Decannulated.   -Need to try pink tape in more compressive dressing to stoma to prevent air leakage 12.GI: Gastrostomy tube 03/01/2017 per Dr. Hulen Skains. Presently not receiving tube feeds.    -belly remains mildly distended. Ileus , resolved, bowel movements without laxatives yesterday  discontinue IV reglan qid  -change diet to mechanical soft  -Continue Senokot S  -Plan removal of PEG this week 13.Urinary retention/urethral injury. Status post suprapubic catheter placement 03/21/2017 per interventional radiology. Follow-up urology services Dr. Louis Meckel  -[facility]  changed cath out 04/27/2017  -100k proteus from cath specimen: empiric bactrim started. UCS shows sensitivity to this antibiotic. Continue 7 day course 14.Acute blood loss anemia. Follow-up CBC. Continue iron supplement 15. DM 2:   -on CM diet--changed to regular carb, full-liquid for now  -amaryl 78m---hold for now , blood sugars reasonably controlled on sliding scale 04/30/2017 Appetite improved blood sugars climbing will treat more aggressively now that p.o. intakes improved   CBG (last 3)  Recent Labs    05/01/17 1847 05/01/17 2103 05/02/17 0643  GLUCAP 189* 231* 146*   16. Tachycardia improving  - Likely due to debility   -improved with increased metoprolol  LOS (Days) 17 A FACE TO FACE EVALUATION WAS PERFORMED  KCharlett Blake MD 05/02/2017 8:56 AM

## 2017-05-03 ENCOUNTER — Inpatient Hospital Stay (HOSPITAL_COMMUNITY): Payer: BLUE CROSS/BLUE SHIELD

## 2017-05-03 ENCOUNTER — Inpatient Hospital Stay (HOSPITAL_COMMUNITY): Payer: BLUE CROSS/BLUE SHIELD | Admitting: Physical Therapy

## 2017-05-03 ENCOUNTER — Inpatient Hospital Stay (HOSPITAL_COMMUNITY): Payer: BLUE CROSS/BLUE SHIELD | Admitting: Speech Pathology

## 2017-05-03 LAB — GLUCOSE, CAPILLARY
GLUCOSE-CAPILLARY: 135 mg/dL — AB (ref 65–99)
GLUCOSE-CAPILLARY: 183 mg/dL — AB (ref 65–99)
GLUCOSE-CAPILLARY: 147 mg/dL — AB (ref 65–99)
Glucose-Capillary: 133 mg/dL — ABNORMAL HIGH (ref 65–99)

## 2017-05-03 NOTE — Progress Notes (Signed)
Occupational Therapy Session Note  Patient Details  Name: Peter [name] MRN: [id number] Date of Birth: 08/12/1960  Today's Date: 05/03/2017 OT Individual Time: 0900-1000 OT Individual Time Calculation (min): 60 min    Short Term Goals: Week 2:  OT Short Term Goal 1 (Week 2): Pt will complete LB dressing at sit<stand level with LRAD OT Short Term Goal 2 (Week 2): Pt will perform squat pivot transfer to [facility] with max A OT Short Term Goal 3 (Week 2): Pt will perform UB dressing tasks with mod A OT Short Term Goal 4 (Week 2): Pt will perform LB dressing tasks with max A sit<>stand.  Skilled Therapeutic Interventions/Progress Updates:    Pt resting in bed upon arrival with RN and sister present.  Initial focus on bed mobility, BUE PROM/AAROM and AROM with 2# weight cuff.  Kinesio tape applied to RUE/hand for edema management.  Pt declined getting OOB 2/2 concern that he would have a BM after getting in w/c.  Pt agreed to getting OOB tomorrow morning during morning OT session. Pt's sister performed BUE PROM and AROM exercises with pt.  Continued ongoing education and discharge planning.   Therapy Documentation Precautions:  Precautions Precautions: Fall Precaution Comments: trach, PEG, catheter  Required Braces or Orthoses: Other Brace/Splint Other Brace/Splint: bil PRAFO, abdominal binder, bil hand splints at night Restrictions Weight Bearing Restrictions: Yes RLE Weight Bearing: Weight bearing as tolerated LLE Weight Bearing: Weight bearing as tolerated Other Position/Activity Restrictions: no ROM restrictions bil LE   Pain: Pain Assessment Pain Assessment: No/denies pain  See Function Navigator for Current Functional Status.   Therapy/Group: Individual Therapy  Peter [name] 05/03/2017, 3:03 PM

## 2017-05-03 NOTE — Progress Notes (Signed)
Speech Language Pathology Daily Session Note  Patient Details  Name: Peter [name] MRN: [id number] Date of Birth: [birth year]  Today's Date: 05/03/2017 SLP Individual Time: 0805(30 min time made up for missed minutes )-0900 SLP Individual Time Calculation (min): 55 min  Short Term Goals: Week 3: SLP Short Term Goal 1 (Week 3): Pt will recall new, daily information with Min A verbal and visual cues.  SLP Short Term Goal 2 (Week 3): Pt will selective attention for ~ 30 minutes with Min A cues.  SLP Short Term Goal 3 (Week 3): Pt will self-monitor and correct errors during functional tasks with Min A cues.  SLP Short Term Goal 4 (Week 3): Pt will complete mildly complex problems with supervision A verbal and question cues.   Skilled Therapeutic Interventions:  Pt was seen for skilled ST targeting cognitive goals.  Pt was incontinent of bowel upon therapist's arrival and was able to direct his care during hygiene and bed mobility with min question cues.  Once in a clean brief and pants, therapist facilitated the session with continued practice for medication management.  Pt was able to load remaining pills into a pill box for 100% accuracy with x1 supervision cue to recognize and correct errors.  Pt was left in bed at the end of today's therapy session with wife at bedside.  Continue per current plan of care.    Function:  Eating Eating                 Cognition Comprehension Comprehension assist level: Follows complex conversation/direction with extra time/assistive device  Expression   Expression assist level: Expresses complex ideas: With extra time/assistive device  Social Interaction Social Interaction assist level: Interacts appropriately with others with medication or extra time (anti-anxiety, antidepressant).  Problem Solving Problem solving assist level: Solves complex 90% of the time/cues < 10% of the time  Memory Memory assist level: More than reasonable amount of time     Pain Pain Assessment Pain Assessment: No/denies pain  Therapy/Group: Individual Therapy  Oris Staffieri, Selinda Orion 05/03/2017, 12:40 PM
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
## 2017-05-03 NOTE — Progress Notes (Signed)
Speech Language Pathology Daily Session Note  Patient Details  Name: Peter [name] MRN: [id number] Date of Birth: [birth year]  Today's Date: 05/03/2017 SLP Individual Time: 1100-1200 SLP Individual Time Calculation (min): 60 min  Short Term Goals: Week 3: SLP Short Term Goal 1 (Week 3): Pt will recall new, daily information with Min A verbal and visual cues.  SLP Short Term Goal 2 (Week 3): Pt will selective attention for ~ 30 minutes with Min A cues.  SLP Short Term Goal 3 (Week 3): Pt will self-monitor and correct errors during functional tasks with Min A cues.  SLP Short Term Goal 4 (Week 3): Pt will complete mildly complex problems with supervision A verbal and question cues.   Skilled Therapeutic Interventions: Skilled treatment session focused on cognition goals. 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 Pt with much improved cognitive abilities. Pt also able to recall all therapies and therapy activities from morning sessions. Pt was left upright in bed with sister present.      Function:   Cognition Comprehension Comprehension assist level: Understands complex 90% of the time/cues 10% of the time;Follows complex conversation/direction with extra time/assistive device  Expression   Expression assist level: Expresses complex 90% of the time/cues < 10% of the time;Expresses complex ideas: With extra time/assistive device  Social Interaction Social Interaction assist level: Interacts appropriately with others with medication or extra time (anti-anxiety, antidepressant).  Problem Solving Problem solving assist level: Solves basic problems with no assist;Solves complex 90% of the time/cues < 10% of the time  Memory Memory assist level: More than reasonable amount of time;Complete Independence: No helper    Pain    Therapy/Group:  Individual Therapy  Peter [name] 05/03/2017, 11:56 AM

## 2017-05-03 NOTE — Progress Notes (Signed)
Physical Therapy Session Note  Patient Details  Name: Peter [name] MRN: [id number] Date of Birth: [birth year]  Today's Date: 05/03/2017 PT Individual Time: 1400-1500 PT Individual Time Calculation (min): 60 min   Short Term Goals: Week 3:  PT Short Term Goal 1 (Week 3): pt will perform bed mobility with minA PT Short Term Goal 2 (Week 3): Pt will perform slide board transfers with consistent minA PT Short Term Goal 3 (Week 3): Pt will demonstrate w/c propulsion x100' with consistent S PT Short Term Goal 4 (Week 3): Pt will demonstrate sit <>stand with modA  Skilled Therapeutic Interventions/Progress Updates: Pt received supine in bed, denies pain and agreeable to treatment. Supine>sit modA with bedrails and assist for trunk control, cues for use of UEs to push up from elbow. Sit >stand in stedy modA to transfer to w/c, second person assisted with lowering stedy seat. W/c propulsion x100' with BUE and S for strengthening and endurance; note improving speed and coordination with UEs. Slideboard transfer w/c <>mat table minA; cues for head/hips relationship and proper foot placement. Sit <>stand x5 trials from elevated mat table with RW and modA +2 faded to modA +1. Trialed sit <>stand with eva walker; requires min/modA +1. Tactile cues for hip/trunk extension in stance, min guard at L knee for stance control. SIt <>supine modA on mat table. Performed BLE bridging 2x10 reps with assist to maintain knees in neutral alignment. Returned to room in w/c totalA; remained seated in w/c at end of session, all needs in reach. Pt required min cues to recall pressure relief every 30 min; referred pt to handouts provided yesterday.      Therapy Documentation Precautions:  Precautions Precautions: Fall Precaution Comments: trach, PEG, catheter  Required Braces or Orthoses: Other Brace/Splint Other Brace/Splint: bil PRAFO, abdominal binder, bil hand splints at night Restrictions Weight Bearing  Restrictions: Yes RLE Weight Bearing: Weight bearing as tolerated LLE Weight Bearing: Weight bearing as tolerated Other Position/Activity Restrictions: no ROM restrictions bil LE   See Function Navigator for Current Functional Status.   Therapy/Group: Individual Therapy  Luberta Mutter 05/03/2017, 4:06 PM
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
## 2017-05-04 ENCOUNTER — Inpatient Hospital Stay (HOSPITAL_COMMUNITY): Payer: BLUE CROSS/BLUE SHIELD | Admitting: Physical Therapy

## 2017-05-04 ENCOUNTER — Inpatient Hospital Stay (HOSPITAL_COMMUNITY): Payer: BLUE CROSS/BLUE SHIELD

## 2017-05-04 ENCOUNTER — Inpatient Hospital Stay (HOSPITAL_COMMUNITY): Payer: BLUE CROSS/BLUE SHIELD | Admitting: Speech Pathology

## 2017-05-04 LAB — GLUCOSE, CAPILLARY
GLUCOSE-CAPILLARY: 195 mg/dL — AB (ref 65–99)
Glucose-Capillary: 133 mg/dL — ABNORMAL HIGH (ref 65–99)
Glucose-Capillary: 128 mg/dL — ABNORMAL HIGH (ref 65–99)
Glucose-Capillary: 119 mg/dL — ABNORMAL HIGH (ref 65–99)

## 2017-05-04 NOTE — Progress Notes (Signed)
Physical Therapy Session Note  Patient Details  Name: Peter [name] MRN: [id number] Date of Birth: 05/12/1961  Today's Date: 05/04/2017 PT Individual Time: 0900-1000 PT Individual Time Calculation (min): 60 min   Short Term Goals: Week 3:  PT Short Term Goal 1 (Week 3): pt will perform bed mobility with minA PT Short Term Goal 2 (Week 3): Pt will perform slide board transfers with consistent minA PT Short Term Goal 3 (Week 3): Pt will demonstrate w/c propulsion x100' with consistent S PT Short Term Goal 4 (Week 3): Pt will demonstrate sit <>stand with modA  Skilled Therapeutic Interventions/Progress Updates: Pt received seated in w/c, denies pain but does report LE "soreness". Educated pt and sister on seated quad stretch. Pt verbalizes concern that his stomach is cramping and he doesn't want to have an incontinent BM but states he will try to participate in therapy. Encouraged pt to get onto toilet using stedy to continue working on functional strengthening while also avoiding incontinence. Pt performed sit >stand in stedy maxA for boosting to stand, min guard for standing balance 10-20 sec while therapist managing stedy seat and clothing management. Pt had small incontinent BM before making it to toilet. Required several minutes on toilet to empty. Sit <>stand x4 trials from stedy seat in order to complete peri hygiene d/t fatigue in standing; however able to demo longer standing tolerance 45-60 sec each trial. Provided pt with theraband loop and grabber to simulate donning pants in sitting; unable to perform without grabber. Requires minA for pulling "pants" over hips in standing d/t decreased [place] and static balance with only one UE support. MaxA for sit >stand in stedy from w/c, min guard standing balance. At end of session, instructed pt to rest as long as needed, but then to work on pulling theraband over head to simulate taking off a shirt. Encouraged pt to continue practicing with grabber  and theraband for simulated donning shirt/pants. Remained seated in w/c at end of session, all needs in reach.      Therapy Documentation Precautions:  Precautions Precautions: Fall Precaution Comments: trach, PEG, catheter  Required Braces or Orthoses: Other Brace/Splint Other Brace/Splint: bil PRAFO, abdominal binder, bil hand splints at night Restrictions Weight Bearing Restrictions: Yes RLE Weight Bearing: Weight bearing as tolerated LLE Weight Bearing: Weight bearing as tolerated Other Position/Activity Restrictions: no ROM restrictions bil LE  See Function Navigator for Current Functional Status.   Therapy/Group: Individual Therapy  Luberta Mutter 05/04/2017, 10:10 AM

## 2017-05-04 NOTE — Progress Notes (Signed)
Occupational Therapy Weekly Progress Note  Patient Details  Name: Peter [name] MRN: [id number] Date of Birth: 05-Sep-1960  Beginning of progress report period: April 26, 2017 End of progress report period: May 04, 2017  Patient has met 1 of 4 short term goals.  Progress during the past week has been minimal and inconsistent secondary to ongoing medical/health issues.  Pt's discharge date has been extended by one week, as a result.  Pt requires mod A/ max A for bed mobility and steady A/ supervision for sitting unsupported EOB for UB dressing tasks.  Pt requires min A/mod A for slide board transfers and max A for sit<>stand with Stedy.  Pt continues to exhibit weak/limited BUE AROM.  Pt's family has been educated in performing [place]. Patient continues to demonstrate the following deficits: muscle weakness, muscle joint tightness and muscle paralysis, decreased cardiorespiratoy endurance and decreased sitting balance, decreased standing balance, decreased postural control and decreased balance strategies and therefore will continue to benefit from skilled OT intervention to enhance overall performance with BADL.  Patient progressing toward long term goals at a slow pace.  Continue plan of care.  OT Short Term Goals Week 2:  OT Short Term Goal 1 (Week 2): Pt will complete LB dressing at sit<stand level with LRAD OT Short Term Goal 1 - Progress (Week 2): Discontinued (comment)(duplicate STG) OT Short Term Goal 2 (Week 2): Pt will perform squat pivot transfer to [facility] with max A OT Short Term Goal 2 - Progress (Week 2): Progressing toward goal OT Short Term Goal 3 (Week 2): Pt will perform UB dressing tasks with mod A OT Short Term Goal 3 - Progress (Week 2): Met OT Short Term Goal 4 (Week 2): Pt will perform LB dressing tasks with max A sit<>stand. OT Short Term Goal 4 - Progress (Week 2): Progressing toward goal Week 3:  OT Short Term Goal 1 (Week 3): Pt will perform squat pivot  transfer to [facility] with max A OT Short Term Goal 2 (Week 3): Pt will perform LB dressing tasks with max A sit<>stand. OT Short Term Goal 3 (Week 3): Pt will perform UB dressing tasks seated EOB with min A      Therapy Documentation Precautions:  Precautions Precautions: Fall Precaution Comments: trach, PEG, catheter  Required Braces or Orthoses: Other Brace/Splint Other Brace/Splint: bil PRAFO, abdominal binder, bil hand splints at night Restrictions Weight Bearing Restrictions: Yes RLE Weight Bearing: Weight bearing as tolerated LLE Weight Bearing: Weight bearing as tolerated Other Position/Activity Restrictions: no ROM restrictions bil LE    See Function Navigator for Current Functional Status.    [name] [facility] 05/04/2017, 6:52 AM

## 2017-05-04 NOTE — Progress Notes (Signed)
Occupational Therapy Session Note  Patient Details  Name: Mordechai Matuszak MRN: [id number] Date of Birth: 12/04/1960  Today's Date: 05/04/2017 OT Individual Time: 8118-8677 OT Individual Time Calculation (min): 57 min    Short Term Goals: Week 3:  OT Short Term Goal 1 (Week 3): Pt will perform squat pivot transfer to [facility] with max A OT Short Term Goal 2 (Week 3): Pt will perform LB dressing tasks with max A sit<>stand. OT Short Term Goal 3 (Week 3): Pt will perform UB dressing tasks seated EOB with min A  Skilled Therapeutic Interventions/Progress Updates:    Pt resting in bed upon arrival.  Pt incontinent of bowel and required tot A for hygiene.  Pt required min A for rolling in bed with use of bed rails.  Pt required max A for supine>sit EOB in preparation for slide board transfer to w/c.  Pt sat EOB at supervision level. Pt required mod A for slide board transfer to w/c.  Pt completed grooming tasks (washing face and shaving with electric razor) seated in w/c at sink.  Pt remained in w/c with sister present.  Focus on activity tolerance, bed mobility, slide board transfers, sitting balance, and safety awareness to increase independence with BADLs.   Therapy Documentation Precautions:  Precautions Precautions: Fall Precaution Comments: trach, PEG, catheter  Required Braces or Orthoses: Other Brace/Splint Other Brace/Splint: bil PRAFO, abdominal binder, bil hand splints at night Restrictions Weight Bearing Restrictions: Yes RLE Weight Bearing: Weight bearing as tolerated LLE Weight Bearing: Weight bearing as tolerated Other Position/Activity Restrictions: no ROM restrictions bil LE   Pain:  Pt denies pain  See Function Navigator for Current Functional Status.   Therapy/Group: Individual Therapy  [name] 05/04/2017, 8:59 AM

## 2017-05-04 NOTE — Progress Notes (Signed)
Speech Language Pathology Discharge Summary  Patient Details  Name: Juniper Snyders MRN: [id number] Date of Birth: 08-10-1960  Today's Date: 05/04/2017 SLP Individual Time: 1000-1100 SLP Individual Time Calculation (min): 60 min   Skilled Therapeutic Interventions:  Skilled treatment session focused on cognition goals. SLP facilitated session by administering [place] d/t decreased hand motility. Pt obtained a score of 18 out of 22 with n=> 18. Pt's score indicates progress and functional ability levels. Pt has met all goals and cognitive abilities are appropriate at this time. Pt and sister in agreement and understand discharge from [place] services.      Patient has met 4 of 4 long term goals.  Patient to discharge at overall Supervision level.    Clinical Impression/Discharge Summary:   Pt has made great progress in skilled ST sessions and as a result has met all of his goals. He is being discharged from [place] at a supervision to Mod I level for cognition. Once pt is at home, he might benefit from further ST assessment within home environment.    Care Partner:  Caregiver Able to Provide Assistance: Yes     Recommendation:  None        Reasons for discharge: Treatment goals met   Patient/Family Agrees with Progress Made and Goals Achieved: Yes   Function:    Cognition Comprehension Comprehension assist level: Follows complex conversation/direction with no assist  Expression   Expression assist level: Expresses complex ideas: With no assist  Social Interaction Social Interaction assist level: Interacts appropriately with others with medication or extra time (anti-anxiety, antidepressant).  Problem Solving Problem solving assist level: Solves complex problems: With extra time  Memory Memory assist level: More than reasonable amount of time   Sheilia Reznick 05/04/2017, 10:38 AM

## 2017-05-04 NOTE — Progress Notes (Signed)
[facility] PHYSICAL MEDICINE & REHABILITATION     PROGRESS NOTE    Subjective/Complaints: Sore from therapy yesterday. Sleeping good  ROS: pt denies nausea, vomiting, diarrhea, cough, shortness of breath or chest pain    Objective: Vital Signs: Blood pressure 138/88, pulse 95, temperature 97.9 F (36.6 C), temperature source Oral, resp. rate 16, height 5\' 10"  (1.778 m), weight 68.1 kg (150 lb 3 oz), SpO2 99 %. No results found. No results for input(s): WBC, HGB, HCT, PLT in the last 72 hours. Recent Labs    05/02/17 0537  NA 139  K 3.4*  CL 110  GLUCOSE 146*  BUN 12  CREATININE 0.72  CALCIUM 8.8*   CBG (last 3)  Recent Labs    05/03/17 1629 05/03/17 2100 05/04/17 0616  GLUCAP 133* 147* 133*    Wt Readings from Last 3 Encounters:  05/03/17 68.1 kg (150 lb 3 oz)  03/23/17 74.4 kg (164 lb 0.4 oz)    Physical Exam:  Constitutional: Sitting in bed in no distress HENT:  Head: Normocephalicand atraumatic.  Eyes: Pupils are equal, round, and reactive to light. EOMare normal.  Neck: Normal range of motion. Neck supple. No thyromegalypresent.  Trach stoma closed Cardiovascular: RRR without murmur. No JVD   Respiratory: CTA Bilaterally without wheezes or rales. Normal effort  GI: soft NT, PEG  Skin. Warm and dry, thin Musculoskeletal. Patient with tr edema to the right hand. Palpable pedal pulses lower extremities Genitourinary.  Suprapubic tube in place, urine clear Neurological. Awake and alert. Limited insight and awareness. Follows simple commands.  Oriented to place, person, month, reason he's here. UE Bilateral 4/5 Deltoid, biceps, triceps and 4 HI. LE: 2- / 5 Bilateral HF, KE, ADF,APF. Decreased sensation distal legs.  DTR's 1+ Psych: Affect remains flat.   Assessment/Plan: 1. Functional and cognitive deficits secondary to TBI/polytrauma/debility which require 3+ hours per day of interdisciplinary therapy in a comprehensive inpatient rehab  setting. Physiatrist is providing close team supervision and 24 hour management of active medical problems listed below. Physiatrist and rehab team continue to assess barriers to discharge/monitor patient progress toward functional and medical goals.  Function:  Bathing Bathing position   Position: Bed  Bathing parts Body parts bathed by patient: Chest, Abdomen Body parts bathed by helper: Right arm, Right lower leg, Left arm, Left lower leg, Chest, Abdomen, Back, Front perineal area, Buttocks, Right upper leg, Left upper leg  Bathing assist Assist Level: Touching or steadying assistance(Pt > 75%)      Upper Body Dressing/Undressing Upper body dressing   What is the patient wearing?: Pull over shirt/dress     Pull over shirt/dress - Perfomed by patient: Thread/unthread left sleeve Pull over shirt/dress - Perfomed by helper: Thread/unthread right sleeve, Pull shirt over trunk, Put head through opening        Upper body assist Assist Level: Touching or steadying assistance(Pt > 75%)      Lower Body Dressing/Undressing Lower body dressing   What is the patient wearing?: Pants, Non-skid slipper socks, Ted Hose       Pants- Performed by helper: Thread/unthread right pants leg, Thread/unthread left pants leg, Pull pants up/down Non-skid slipper socks- Performed by patient: Don/doff right sock, Don/doff left sock Non-skid slipper socks- Performed by helper: Don/doff right sock, Don/doff left sock               TED Hose - Performed by helper: Don/doff right TED hose, Don/doff left TED hose  Lower body assist Assist for lower  body dressing: Touching or steadying assistance (Pt > 75%)      Toileting Toileting   Toileting steps completed by patient: Adjust clothing prior to toileting, Adjust clothing after toileting Toileting steps completed by helper: Adjust clothing prior to toileting, Performs perineal hygiene, Adjust clothing after toileting    Toileting assist Assist  level: Two helpers   Transfers Chair/bed transfer   Chair/bed transfer method: Lateral scoot Chair/bed transfer assist level: Touching or steadying assistance (Pt > 75%) Chair/bed transfer assistive device: Armrests, Sliding board Mechanical lift: Stedy   Locomotion Ambulation Ambulation activity did not occur: Safety/medical concerns         Wheelchair   Type: Manual Max wheelchair distance: 100 Assist Level: Supervision or verbal cues  Cognition Comprehension Comprehension assist level: Follows complex conversation/direction with extra time/assistive device  Expression Expression assist level: Expresses complex ideas: With extra time/assistive device  Social Interaction Social Interaction assist level: Interacts appropriately with others with medication or extra time (anti-anxiety, antidepressant).  Problem Solving Problem solving assist level: Solves complex 90% of the time/cues < 10% of the time  Memory Memory assist level: More than reasonable amount of time   Medical Problem List and Plan: 1. Decreased functional mobility with cognitive deficitssecondary to TBI/polytrauma 02/07/2017, additional deconditioning due to prolonged hospital stay  -lumbar MRI reviewed personally and negative for plexopathy, ?left L5 radic  -pt with ongoing profound weakness which I feel is likely due severe deconditioning/pelvic fractures/prolonged hospital stay---reviewed with patient today 2. DVT Prophylaxis/Anticoagulation: Subcutaneous Lovenox.  3. Pain Management: Oxycodone 5 mg every 6 hours as needed -fair control 4. Mood: reduced Clonazepam 0.25 mg daily at bedtime, trazodone 50 mg daily at bedtime, Seroquel 100 mg daily at bedtime, Zoloft 50 mg daily, amantadine 100 mg daily, Provigil 100 mg daily   -improved overall 5. Neuropsych: This patient iscapable of making decisions on hisown behalf. 6. Skin/Wound Care: Routine skin checks  - continue air mattress due to sensitive  skin    7. Fluids/Electrolytes/Nutrition: Routine I&O with follow-up chemistries  -replacing k+  8.Nondisplaced fractures of right T7 transverse process. Conservative care 9.Multiple pelvic fractures/bilateral sacroiliac joints and suprapubic symphysis as well as left pelvic sidewall hematoma. Status post sacroiliac screw fixation with external fixation pelvis anterior pelvic ring closed reduction 02/07/2017. May ambulate extended distances weightbearing as tolerated without restrictions. 10.Seizure prophylaxis. Keppra 500 mg every 12 hours. EEG negative 11.Tracheostomy tube 03/01/2017 per Dr. Hulen Skains. Decannulated. 12.GI: Gastrostomy tube 03/01/2017 per Dr. Hulen Skains. Presently not receiving tube feeds.    - still with soft frequent stools---stop senokot-s    -probably will remove PEG next week 13.Urinary retention/urethral injury. Status post suprapubic catheter placement 03/21/2017 per interventional radiology. Follow-up urology services Dr. Louis Meckel  -[facility]  changed cath out 04/27/2017  -100k proteus from cath specimen: empiric bactrim started. UCS shows sensitivity to this antibiotic. completed 7 day course---completed 14.Acute blood loss anemia. Follow-up CBC. Continue iron supplement 15. DM 2:   -on CM diet--changed back to D3/thin. Consider CM diet restriction  -amaryl 2mg  resumed   CBG (last 3)  Recent Labs    05/03/17 1629 05/03/17 2100 05/04/17 0616  GLUCAP 133* 147* 133*   16. Tachycardia improving  - Likely due to debility   -improved with increased metoprolol  LOS (Days) 19 A FACE TO FACE EVALUATION WAS PERFORMED  [name], MD 05/04/2017 8:37 AM

## 2017-05-04 NOTE — Progress Notes (Signed)
Physical Therapy Session Note  Patient Details  Name: Stephanos Fan MRN: [id number] Date of Birth: 1961/01/24  Today's Date: 05/04/2017 PT Individual Time: 1600-1630 PT Individual Time Calculation (min): 30 min   Short Term Goals: Week 3:  PT Short Term Goal 1 (Week 3): pt will perform bed mobility with minA PT Short Term Goal 2 (Week 3): Pt will perform slide board transfers with consistent minA PT Short Term Goal 3 (Week 3): Pt will demonstrate w/c propulsion x100' with consistent S PT Short Term Goal 4 (Week 3): Pt will demonstrate sit <>stand with modA  Skilled Therapeutic Interventions/Progress Updates:    Patient in supine donned TED hose dependent.  Use of railing for supine to sit with assist for both legs and trunk.  Patient used sliding board with mod A for bed to chair transfer.  Propelled w/c 100' with S and increased time with rest breaks.  Sit to stand x 3 to [facility] walker in gym +2 A with facilitation for L LE stance stability able to stand up to 1 minute on third attempt.  Patient denied symptoms of light headedness during standing.  Propelled w/c about 3/4 way back to room with two rest breaks.  Left in chair at pt request and all needs in reach.   Therapy Documentation Precautions:  Precautions Precautions: Fall Precaution Comments: trach, PEG, catheter  Required Braces or Orthoses: Other Brace/Splint Other Brace/Splint: bil PRAFO, abdominal binder, bil hand splints at night Restrictions Weight Bearing Restrictions: Yes RLE Weight Bearing: Weight bearing as tolerated LLE Weight Bearing: Weight bearing as tolerated Other Position/Activity Restrictions: no ROM restrictions bil LE General:   Pain: Pain Assessment Pain Assessment: No/denies pain   See Function Navigator for Current Functional Status.   Therapy/Group: Individual Therapy  Reginia Naas 05/04/2017, 6:30 PM

## 2017-05-04 NOTE — Consult Note (Signed)
[facility] Nurse wound follow up Wound type:HADRPI Measurement:Right anterior thigh 3cm x 2cm x 0.1cm 100% pink moist granulating tissue, no drainage or odor. Right proximal groin 1cm x 1cm 100% pink wound bed, no drainage or odor. Left side of penis shaft healing partial thickness no drainage or odor. Wound bed:see above Drainage (amount, consistency, odor) none Periwound: intact Dressing procedure/placement/frequency:All wounds healing. Foam dressings in place on leg and groin. Penile shaft wrapped with Xeroform. No odor today, intertriginous moisture problem has resolved. [place] team will continue to follow weekly, please re-consult if we need to assist in between visits. Continue current plan of care.   Fara Olden, RN-C, WTA-C Wound Treatment Associate

## 2017-05-05 ENCOUNTER — Inpatient Hospital Stay (HOSPITAL_COMMUNITY): Payer: BLUE CROSS/BLUE SHIELD

## 2017-05-05 ENCOUNTER — Inpatient Hospital Stay (HOSPITAL_COMMUNITY): Payer: BLUE CROSS/BLUE SHIELD | Admitting: Physical Therapy

## 2017-05-05 LAB — GLUCOSE, CAPILLARY
GLUCOSE-CAPILLARY: 78 mg/dL (ref 65–99)
GLUCOSE-CAPILLARY: 108 mg/dL — AB (ref 65–99)
Glucose-Capillary: 107 mg/dL — ABNORMAL HIGH (ref 65–99)
Glucose-Capillary: 163 mg/dL — ABNORMAL HIGH (ref 65–99)

## 2017-05-05 NOTE — Progress Notes (Signed)
Physical Therapy Session Note  Patient Details  Name: Peter [name] MRN: [id number] Date of Birth: [birth year]  Today's Date: 05/05/2017 PT Individual Time: 1515-1600 PT Individual Time Calculation (min): 45 min   Short Term Goals: Week 3:  PT Short Term Goal 1 (Week 3): pt will perform bed mobility with minA PT Short Term Goal 2 (Week 3): Pt will perform slide board transfers with consistent minA PT Short Term Goal 3 (Week 3): Pt will demonstrate w/c propulsion x100' with consistent S PT Short Term Goal 4 (Week 3): Pt will demonstrate sit <>stand with modA  Skilled Therapeutic Interventions/Progress Updates:    Patient reports diarrhea episode earlier after/during previous therapy session.  Reports needing dressing changed after this session.  Patient seen at bedside per his request and performed bed exercises including SAQ x 4 sets of 10, bridging with assist 2/10, lateral trunk rotation with focus on L knee control and graded movements x 2 x 10, and lower trunk flexion with hip and knee flexion and extension rolling bolster under feet 2 x 10.  Supine <> sit mod A for LE's.  Seated reaching task with playing cards with minimal UE support on air mattress.  Patient returned to supine end of session and set up with family in room and all needs in reach.   Therapy Documentation Precautions:  Precautions Precautions: Fall Precaution Comments: trach, PEG, catheter  Required Braces or Orthoses: Other Brace/Splint Other Brace/Splint: bil PRAFO, abdominal binder, bil hand splints at night Restrictions Weight Bearing Restrictions: Yes RLE Weight Bearing: Weight bearing as tolerated LLE Weight Bearing: Weight bearing as tolerated Other Position/Activity Restrictions: no ROM restrictions bil LE Pain: Pain Assessment Pain Score: 0-No pain   See Function Navigator for Current Functional Status.   Therapy/Group: Individual Therapy  Reginia Naas 05/05/2017, 5:18 PM

## 2017-05-05 NOTE — Progress Notes (Signed)
[place] PHYSICAL MEDICINE & REHABILITATION     PROGRESS NOTE    Subjective/Complaints: Lying in bed attempting to eat breakfast.  Generally sore from therapy but no new complaints  ROS: pt denies nausea, vomiting, diarrhea, cough, shortness of breath or chest pain   Objective: Vital Signs: Blood pressure 123/74, pulse (!) 103, temperature (!) 97.5 F (36.4 C), temperature source Oral, resp. rate 18, height 5\' 10"  (1.778 m), weight 68.1 kg (150 lb 3 oz), SpO2 99 %. No results found. No results for input(s): WBC, HGB, HCT, PLT in the last 72 hours. No results for input(s): NA, K, CL, GLUCOSE, BUN, CREATININE, CALCIUM in the last 72 hours.  Invalid input(s): CO CBG (last 3)  Recent Labs    05/04/17 1645 05/04/17 2109 05/05/17 0630  GLUCAP 128* 119* 107*    Wt Readings from Last 3 Encounters:  05/03/17 68.1 kg (150 lb 3 oz)  03/23/17 74.4 kg (164 lb 0.4 oz)    Physical Exam:  Constitutional: Sitting in bed in no distress HENT:  Head: Normocephalicand atraumatic.  Eyes: Pupils are equal, round, and reactive to light. EOMare normal.  Neck: Normal range of motion. Neck supple. No thyromegalypresent.  Trach stoma closed Cardiovascular: RRR without murmur. No JVD  Respiratory: CTA Bilaterally without wheezes or rales. Normal effort  GI: soft NT, PEG  Skin. Warm and dry, thin Musculoskeletal. Tr edema bilateral LE Genitourinary.  Suprapubic tube in place, urine is clear Neurological. Awake and alert. Limited insight and awareness. Follows simple commands.  Oriented to place, person, month, reason he's here. UE Bilateral 2-3/5 Deltoid, biceps, triceps and 3 HI. LE: 2- / 5 Bilateral HF, KE, ADF,APF. Decreased sensation distal legs.  DTR's 1+ Psych: Affect remains flat.   Assessment/Plan: 1. 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 Physiatrist is providing close team supervision and 24 hour management of active medical problems listed below. Physiatrist and rehab team continue to assess barriers to discharge/monitor patient progress toward functional and medical goals.  Function:  Bathing Bathing position   Position: Bed  Bathing parts Body parts bathed by patient: Chest, Abdomen Body parts bathed by helper: Right arm, Right lower leg, Left arm, Left lower leg, Chest, Abdomen, Back, Front perineal area, Buttocks, Right upper leg, Left upper leg  Bathing assist Assist Level: Touching or steadying assistance(Pt > 75%)      Upper Body Dressing/Undressing Upper body dressing   What is the patient wearing?: Pull over shirt/dress     Pull over shirt/dress - Perfomed by patient: Thread/unthread left sleeve Pull over shirt/dress - Perfomed by helper: Thread/unthread right sleeve, Pull shirt over trunk, Put head through opening        Upper body assist Assist Level: Touching or steadying assistance(Pt > 75%)      Lower Body Dressing/Undressing Lower body dressing   What is the patient wearing?: Pants, Non-skid slipper socks, Ted Hose       Pants- Performed by helper: Thread/unthread right pants leg, Thread/unthread left pants leg, Pull pants up/down Non-skid slipper socks- Performed by patient: Don/doff right sock, Don/doff left sock Non-skid slipper socks- Performed by helper: Don/doff right sock, Don/doff left sock               TED Hose - Performed by helper: Don/doff right TED hose, Don/doff left TED hose  Lower body assist Assist for lower body dressing: Touching or steadying assistance (  Pt > 75%)      Toileting Toileting   Toileting steps completed by patient: Adjust clothing prior to toileting, Adjust clothing after toileting Toileting steps completed by helper: Adjust clothing prior to toileting, Performs perineal hygiene, Adjust clothing after toileting    Toileting  assist Assist level: (total assist)   Transfers Chair/bed transfer   Chair/bed transfer method: Lateral scoot Chair/bed transfer assist level: Moderate assist (Pt 50 - 74%/lift or lower) Chair/bed transfer assistive device: Armrests, Sliding board Mechanical lift: Stedy   Locomotion Ambulation Ambulation activity did not occur: Safety/medical concerns         Wheelchair   Type: Manual Max wheelchair distance: 100 Assist Level: Supervision or verbal cues  Cognition Comprehension Comprehension assist level: Understands complex 90% of the time/cues 10% of the time  Expression Expression assist level: Expresses basic 90% of the time/requires cueing < 10% of the time.  Social Interaction Social Interaction assist level: Interacts appropriately 90% of the time - Needs monitoring or encouragement for participation or interaction.  Problem Solving Problem solving assist level: Solves basic 75 - 89% of the time/requires cueing 10 - 24% of the time  Memory Memory assist level: Recognizes or recalls 75 - 89% of the time/requires cueing 10 - 24% of the time   Medical Problem List and Plan: 1. 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 DVT Prophylaxis/Anticoagulation: Subcutaneous Lovenox.  3. Pain Management: Oxycodone 5 mg every 6 hours as needed -fair control 4. Mood: reduced Clonazepam 0.25 mg daily at bedtime, trazodone 50 mg daily at bedtime, Seroquel 100 mg daily at bedtime, Zoloft 50 mg daily, amantadine 100 mg daily, Provigil 100 mg daily   -improved overall 5. Neuropsych: This patient iscapable of making decisions on hisown behalf. 6. Skin/Wound Care: Routine skin checks  - continue air mattress due to sensitive skin    7.  Fluids/Electrolytes/Nutrition: Routine I&O with follow-up chemistries  -replacing k+   -recheck labs monday  8.Nondisplaced fractures of right T7 transverse process. Conservative care 9.Multiple pelvic fractures/bilateral sacroiliac joints and suprapubic symphysis as well as left pelvic sidewall hematoma. Status post sacroiliac screw fixation with external fixation pelvis anterior pelvic ring closed reduction 02/07/2017. May ambulate extended distances weightbearing as tolerated without restrictions. 10.Seizure prophylaxis. Keppra 500 mg every 12 hours. EEG negative 11.Tracheostomy tube 03/01/2017 per Dr. Hulen Skains. Decannulated. 12.GI: Gastrostomy tube 03/01/2017 per Dr. Hulen Skains. Presently not receiving tube feeds.    - stool has been soft, laxatives stopped    -probably will remove PEG next week 13.Urinary retention/urethral injury. Status post suprapubic catheter placement 03/21/2017 per interventional radiology. Follow-up urology services Dr. Louis Meckel  -[facility]  changed cath out 04/27/2017  -100k proteus from cath specimen: empiric bactrim started. UCS shows sensitivity to this antibiotic. completed 7 day course---completed 14.Acute blood loss anemia. Follow-up CBC. Continue iron supplement 15. DM 2:   -on CM diet--changed back to D3/thin. Consider CM diet restriction   -amaryl 2mg  resumed with improvement   CBG (last 3)  Recent Labs    05/04/17 1645 05/04/17 2109 05/05/17 0630  GLUCAP 128* 119* 107*   16. Tachycardia improving  - due to deconditioning   -generally has improved with increased metoprolol  LOS (Days) 20 A FACE TO FACE EVALUATION WAS PERFORMED  [name], MD 05/05/2017 8:43 AM

## 2017-05-05 NOTE — Progress Notes (Signed)
Physical Therapy Session Note  Patient Details  Name: Steen Bisig MRN: [id number] Date of Birth: 11-27-1960  Today's Date: 05/05/2017 PT Individual Time: 1400-1515 PT Individual Time Calculation (min): 75 min   Short Term Goals: Week 3:  PT Short Term Goal 1 (Week 3): pt will perform bed mobility with minA PT Short Term Goal 2 (Week 3): Pt will perform slide board transfers with consistent minA PT Short Term Goal 3 (Week 3): Pt will demonstrate w/c propulsion x100' with consistent S PT Short Term Goal 4 (Week 3): Pt will demonstrate sit <>stand with modA  Skilled Therapeutic Interventions/Progress Updates: Pt received on bedpan; rolling R/L minA to remove bedpan, perform hygiene and clothing management totalA. Supine>sit with modA. Sit >stand in stedy maxA from elevated bed; able to demo standing balance min guard 20-30 sec. W/c propulsion x100' with BUE and S for strengthening and endurance. Slideboard transfer w/c >mat table modA, cues for head/hips relationship. Attempted sit <>stand x2 from elevated mat table with RW and maxA; unable to reach full standing. Pt reports very fatigued from morning therapy sessions. Performed seated shuttle pass (zoomball) for UE strengthening and endurance, scap retraction and grip strength. Pt reports incontinent of bowel in brief. Sit <>stand in stedy to return to w/c and perform hygiene. Pt transferred onto [facility] over toilet to complete void. Returned to bed modA sit >supine. Remained supine in bed at end of session with all needs in reach; RN alerted to scrotum dressing soiled due to incontinence and needed to be changed.      Therapy Documentation Precautions:  Precautions Precautions: Fall Precaution Comments: trach, PEG, catheter  Required Braces or Orthoses: Other Brace/Splint Other Brace/Splint: bil PRAFO, abdominal binder, bil hand splints at night Restrictions Weight Bearing Restrictions: Yes RLE Weight Bearing: Weight bearing as  tolerated LLE Weight Bearing: Weight bearing as tolerated Other Position/Activity Restrictions: no ROM restrictions bil LE   See Function Navigator for Current Functional Status.   Therapy/Group: Individual Therapy  Luberta Mutter 05/05/2017, 3:30 PM

## 2017-05-05 NOTE — Progress Notes (Signed)
Occupational Therapy Session Note  Patient Details  Name: Peter [name] MRN: [id number] Date of Birth: 19-Jun-1961  Today's Date: 05/05/2017 OT Individual Time: 0900-1000 OT Individual Time Calculation (min): 60 min    Short Term Goals: Week 3:  OT Short Term Goal 1 (Week 3): Pt will perform squat pivot transfer to [facility] with max A OT Short Term Goal 2 (Week 3): Pt will perform LB dressing tasks with max A sit<>stand. OT Short Term Goal 3 (Week 3): Pt will perform UB dressing tasks seated EOB with min A  Skilled Therapeutic Interventions/Progress Updates:    Pt resting in bed upon arrival.  Initial focus on bed mobility and sitting balance EOB.  Pt able to initiate moving BLE over to edge of bed but required assistance to complete task.  Pt required mod A for sidelying>sitting EOB.  Pt maintained sitting EOB with supervision.  Pt required mod A for sit<>stand from elevated EOB with Stedy for transfer to w/c.  Pt completed UB bathing/dressing tasks seated in w/c at sink.  Pt transitioned to BUE therex with SciFit (manual level 1) for 10 mins.  Pt was able to weakly grasp SciFit handles without assistance.  Pt returned to room and remained seated in w/c with all needs within reach.   Therapy Documentation Precautions:  Precautions Precautions: Fall Precaution Comments: trach, PEG, catheter  Required Braces or Orthoses: Other Brace/Splint Other Brace/Splint: bil PRAFO, abdominal binder, bil hand splints at night Restrictions Weight Bearing Restrictions: Yes RLE Weight Bearing: Weight bearing as tolerated LLE Weight Bearing: Weight bearing as tolerated Other Position/Activity Restrictions: no ROM restrictions bil LE  Pain: Pain Assessment Pain Assessment: No/denies pain Pain Score: 0-No pain  See Function Navigator for Current Functional Status.   Therapy/Group: Individual Therapy  [name] 05/05/2017, 10:04 AM

## 2017-05-05 NOTE — Progress Notes (Signed)
Physical Therapy Session Note  Patient Details  Name: Peter [name] MRN: [id number] Date of Birth: 01-16-1961  Today's Date: 05/05/2017 PT Individual Time: 1115-1200 PT Individual Time Calculation (min): 45 min   Short Term Goals: Week 3:  PT Short Term Goal 1 (Week 3): pt will perform bed mobility with minA PT Short Term Goal 2 (Week 3): Pt will perform slide board transfers with consistent minA PT Short Term Goal 3 (Week 3): Pt will demonstrate w/c propulsion x100' with consistent S PT Short Term Goal 4 (Week 3): Pt will demonstrate sit <>stand with modA  Skilled Therapeutic Interventions/Progress Updates:   Session focused on NMR to address postural control, standing tolerance, pre-gait and progressing to gait in Lite Gait. In parallel bars, performed max assist sit <> stands x 4 with facilitation for anterior weighshift and achieving full upright posture through trunk and blocking of LLE - limited ability to maintain full standing position before seated rest break needed. Able to move RLE one time to advance but then required seated rest break. Utilized lite gait to increase ability to maintain standing and progress to gait - pt able to advance BLE independently with facilitation for weightshifting, cues for foot placement and verbal and tactile cues/facilitation for upright posture including lumbar extension. Pt able to go x 15' (mainly limited by time constraint, pt not in distress) and noted to have narrow BOS, decreased control and coordination of steps, and occasional LLE crossing midline. Pt performed 2 x sit <> stand for adjustment of harness pieces with max assist.   Therapy Documentation Precautions:  Precautions Precautions: Fall Precaution Comments: decanulated, PEG, catheter  Required Braces or Orthoses: Other Brace/Splint Other Brace/Splint: bil PRAFO, abdominal binder, bil hand splints at night Restrictions Weight Bearing Restrictions: Yes RLE Weight Bearing: Weight  bearing as tolerated LLE Weight Bearing: Weight bearing as tolerated Other Position/Activity Restrictions: no ROM restrictions bil LE Pain: Reports soreness in BUE from arm bike this morning but denies pain.    See Function Navigator for Current Functional Status.   Therapy/Group: Individual Therapy  [name], PT, DPT  05/05/2017, 12:06 PM

## 2017-05-06 ENCOUNTER — Inpatient Hospital Stay (HOSPITAL_COMMUNITY): Payer: BLUE CROSS/BLUE SHIELD | Admitting: Occupational Therapy

## 2017-05-06 DIAGNOSIS — G8918 Other acute postprocedural pain: Secondary | ICD-10-CM

## 2017-05-06 DIAGNOSIS — Z9359 Other cystostomy status: Secondary | ICD-10-CM

## 2017-05-06 DIAGNOSIS — S22069A Unspecified fracture of T7-T8 vertebra, initial encounter for closed fracture: Secondary | ICD-10-CM

## 2017-05-06 DIAGNOSIS — G6281 Critical illness polyneuropathy: Secondary | ICD-10-CM

## 2017-05-06 DIAGNOSIS — S22069S Unspecified fracture of T7-T8 vertebra, sequela: Secondary | ICD-10-CM

## 2017-05-06 DIAGNOSIS — R000 Tachycardia, unspecified: Secondary | ICD-10-CM

## 2017-05-06 LAB — GLUCOSE, CAPILLARY
GLUCOSE-CAPILLARY: 69 mg/dL (ref 65–99)
GLUCOSE-CAPILLARY: 114 mg/dL — AB (ref 65–99)
Glucose-Capillary: 119 mg/dL — ABNORMAL HIGH (ref 65–99)
Glucose-Capillary: 260 mg/dL — ABNORMAL HIGH (ref 65–99)

## 2017-05-06 NOTE — Plan of Care (Signed)
Progressing RH BOWEL ELIMINATION RH STG MANAGE BOWEL WITH ASSISTANCE Description STG Manage Bowel with Assistance. Mod  05/06/2017 1649 - Progressing by Ginette Pitman, RN RH SKIN INTEGRITY RH STG SKIN FREE OF INFECTION/BREAKDOWN Description Mod assist  05/06/2017 1649 - Progressing by Ginette Pitman, RN RH STG MAINTAIN SKIN INTEGRITY WITH ASSISTANCE Description STG Maintain Skin Integrity With Assistance. Mod assist  05/06/2017 1649 - Progressing by Ginette Pitman, RN RH PAIN MANAGEMENT RH STG PAIN MANAGED AT OR BELOW PT'S PAIN GOAL Description Less than 4.  05/06/2017 1649 - Progressing by Ginette Pitman, RN Consults [facility] GENERAL PATIENT EDUCATION Description See Patient Education module for education specifics. 05/06/2017 1649 - Progressing by Ginette Pitman, RN Skin Care Protocol Initiated - if Braden Score 18 or less Description If consults are not indicated, leave blank or document N/A 05/06/2017 1649 - Progressing by Ginette Pitman, RN Nutrition Consult-if indicated 05/06/2017 1649 - Progressing by Ginette Pitman, RN Diabetes Guidelines if Diabetic/Glucose > 140 Description If diabetic or lab glucose is > 140 mg/dl - Initiate Diabetes/Hyperglycemia Guidelines & Document Interventions  05/06/2017 1649 - Progressing by Ginette Pitman, RN RH BOWEL ELIMINATION RH STG MANAGE BOWEL WITH ASSISTANCE Description STG Manage Bowel with Assistance. 05/06/2017 1649 - Progressing by Ginette Pitman, RN RH STG MANAGE BOWEL W/MEDICATION W/ASSISTANCE Description STG Manage Bowel with Medication with Assistance. 05/06/2017 1649 - Progressing by Ginette Pitman, RN RH STG MANAGE BOWEL W/EQUIPMENT W/ASSISTANCE Description STG Manage Bowel With Equipment With Assistance 05/06/2017 1649 - Progressing by Ginette Pitman, RN RH OTHER STG BOWEL ELIMINATION GOALS W/ASSIST Description Other STG Bowel Elimination Goals With Assistance. 05/06/2017  1649 - Progressing by Ginette Pitman, RN RH BLADDER ELIMINATION RH STG MANAGE BLADDER WITH ASSISTANCE Description STG Manage Bladder With Assistance 05/06/2017 1649 - Progressing by Ginette Pitman, RN RH STG MANAGE BLADDER WITH MEDICATION WITH ASSISTANCE Description STG Manage Bladder With Medication With Assistance. 05/06/2017 1649 - Progressing by Ginette Pitman, RN RH STG MANAGE BLADDER WITH EQUIPMENT WITH ASSISTANCE Description STG Manage Bladder With Equipment With Assistance 05/06/2017 1649 - Progressing by Ginette Pitman, RN RH OTHER STG BLADDER ELIMINATION GOALS W/ASSIST Description Other STG Bladder Elimination Goals With Assistance 05/06/2017 1649 - Progressing by Ginette Pitman, RN RH SKIN INTEGRITY RH STG SKIN FREE OF INFECTION/BREAKDOWN 05/06/2017 1649 - Progressing by Ginette Pitman, RN RH STG MAINTAIN SKIN INTEGRITY WITH ASSISTANCE Description STG Maintain Skin Integrity With Assistance. 05/06/2017 1649 - Progressing by Ginette Pitman, RN RH STG ABLE TO PERFORM INCISION/WOUND CARE W/ASSISTANCE Description STG Able To Perform Incision/Wound Care With Assistance. 05/06/2017 1649 - Progressing by Ginette Pitman, RN RH OTHER STG SKIN INTEGRITY GOALS W/ASSIST Description Other STG Skin Integrity Goals With Assistance. 05/06/2017 1649 - Progressing by Ginette Pitman, RN RH SAFETY RH STG ADHERE TO SAFETY PRECAUTIONS W/ASSISTANCE/DEVICE Description STG Adhere to Safety Precautions With Assistance/Device. 05/06/2017 1649 - Progressing by Ginette Pitman, RN RH STG DECREASED RISK OF FALL WITH ASSISTANCE Description STG Decreased Risk of Fall With Assistance. 05/06/2017 1649 - Progressing by Ginette Pitman, RN RH STG [place] 05/06/2017 1649 - Progressing by Ginette Pitman, RN RH OTHER STG SAFETY GOALS W/ASSIST Description Other STG Safety Goals With Assistance. 05/06/2017 1649 -  Progressing by Ginette Pitman, RN RH PAIN MANAGEMENT RH STG PAIN MANAGED AT OR BELOW PT'S PAIN GOAL 05/06/2017 1649 - Progressing by Ginette Pitman, RN RH OTHER STG PAIN MANAGEMENT GOALS W/ASSIST Description Other STG Pain Management Goals With Assistance. 05/06/2017 1649 - Progressing by Ginette Pitman, RN RH KNOWLEDGE DEFICIT GENERAL RH STG  INCREASE KNOWLEDGE OF SELF CARE AFTER HOSPITALIZATION 05/06/2017 1649 - Progressing by Ginette Pitman, RN RH Pre-functional (Specify) RH LTG Pre-functional (Specify) 05/06/2017 1649 - Progressing by Ginette Pitman, RN

## 2017-05-06 NOTE — Progress Notes (Signed)
Occupational Therapy Session Note  Patient Details  Name: Peter [name] MRN: [id number] Date of Birth: [birth year]  Today's Date: 05/06/2017 OT Individual Time: 1005-1100 OT Individual Time Calculation (min): 55 min    Short Term Goals: Week 3:  OT Short Term Goal 1 (Week 3): Pt will perform squat pivot transfer to [facility] with max A OT Short Term Goal 2 (Week 3): Pt will perform LB dressing tasks with max A sit<>stand. OT Short Term Goal 3 (Week 3): Pt will perform UB dressing tasks seated EOB with min A  Skilled Therapeutic Interventions/Progress Updates:    Pt resting in bed upon OT arrival, reporting he had just finished being assisted with perineal care following incontinent BM.  Pt rolled to R side with Min A to bend LLE, and transitioned to sitting EOB with Mod A to manage trunk and guide LLE.  Pt performed SB transfer with Mod A towards L side into w/c.  Pt declined participating in ADL tasks this AM.  Pt completed functional reaching activities in gym with OT providing some assistance for overhead tasks.  Pt played checkers with game on vertically slanted board to facilitate improved bilateral shoulder ROM necessary for self-care tasks.  Min A provided occasionally to guide RUE.  Min-mod verbal cues required to strategize and sustain attention throughout game, pt occasionally tangential as he fatigue however redirected easily to activity.  Pt assisted back to room and remained seated in w/c with all needs in reach and family present upon OT departure.  Therapy Documentation Precautions:  Precautions Precautions: Fall Precaution Comments: trach, PEG, catheter  Required Braces or Orthoses: Other Brace/Splint Other Brace/Splint: bil PRAFO, abdominal binder, bil hand splints at night Restrictions Weight Bearing Restrictions: Yes RLE Weight Bearing: Weight bearing as tolerated LLE Weight Bearing: Weight bearing as tolerated Other Position/Activity Restrictions: no ROM restrictions bil  LE Pain: Pain Assessment Pain Assessment: No/denies pain ADL: ADL ADL Comments: Please see functional navigator for ADL status  See Function Navigator for Current Functional Status.   Therapy/Group: Individual Therapy  Marcella Dubs 05/06/2017, 12:27 PM

## 2017-05-06 NOTE — Progress Notes (Signed)
[place] PHYSICAL MEDICINE & REHABILITATION     PROGRESS NOTE    Subjective/Complaints: Seen lying in bed this morning. He states he slept well overnight. He denies complaints this morning. Family at bedside.  ROS: Denies nausea, vomiting, diarrhea, shortness of breath or chest pain   Objective: Vital Signs: Blood pressure (!) 144/79, pulse (!) 104, temperature 97.6 F (36.4 C), temperature source Oral, resp. rate 17, height 5\' 10"  (1.778 m), weight 68.1 kg (150 lb 3 oz), SpO2 96 %. No results found. No results for input(s): WBC, HGB, HCT, PLT in the last 72 hours. No results for input(s): NA, K, CL, GLUCOSE, BUN, CREATININE, CALCIUM in the last 72 hours.  Invalid input(s): CO CBG (last 3)  Recent Labs    05/05/17 1709 05/05/17 2152 05/06/17 0656  GLUCAP 78 108* 119*    Wt Readings from Last 3 Encounters:  05/03/17 68.1 kg (150 lb 3 oz)  03/23/17 74.4 kg (164 lb 0.4 oz)    Physical Exam:  Constitutional: NAD. Vital signs reviewed. HENT: Normocephalicand atraumatic.  Eyes: EOMare normal. No discharge.  Cardiovascular: RRR No JVD  Respiratory: CTA Bilaterally Normal effort  GI: BS+. ND Skin. Warm and dry, intact Musculoskeletal. No edema. No tenderness.  Genitourinary.  + Suprapubic cath Neurological. Alert and oriented 3.  Follows simple commands.   B/l UE Bilateral 3+/5 Deltoid, biceps, triceps and 1/5 HI.  B/l LE: 1/5 HF, KE, ADF,APF.  Psych: Affect flat.   Assessment/Plan: 1. 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 Physiatrist is providing close team supervision and 24 hour management of active medical problems listed below. Physiatrist and rehab team continue to assess barriers to discharge/monitor patient progress toward functional and medical goals.  Function:  Bathing Bathing position  Bathing activity did not occur: N/A(night bath) Position: Bed  Bathing parts Body parts bathed by patient: Chest, Abdomen Body parts bathed by helper: Right arm, Right lower leg, Left arm, Left lower leg, Chest, Abdomen, Back, Front perineal area, Buttocks, Right upper leg, Left upper leg  Bathing assist Assist Level: Touching or steadying assistance(Pt > 75%)      Upper Body Dressing/Undressing Upper body dressing   What is the patient wearing?: Pull over shirt/dress     Pull over shirt/dress - Perfomed by patient: Thread/unthread right sleeve Pull over shirt/dress - Perfomed by helper: Thread/unthread left sleeve, Put head through opening, Pull shirt over trunk        Upper body assist Assist Level: Touching or steadying assistance(Pt > 75%)      Lower Body Dressing/Undressing Lower body dressing   What is the patient wearing?: Pants, Non-skid slipper socks, Ted Hose       Pants- Performed by helper: Thread/unthread right pants leg, Thread/unthread left pants leg, Pull pants up/down Non-skid slipper socks- Performed by patient: Don/doff right sock, Don/doff left sock Non-skid slipper socks- Performed by helper: Don/doff right sock, Don/doff left sock               TED Hose - Performed by helper: Don/doff right TED hose, Don/doff left TED hose  Lower body assist Assist for lower body dressing: Touching or steadying assistance (Pt > 75%)      Toileting Toileting   Toileting steps completed by patient: Adjust clothing prior to toileting, Adjust clothing after toileting Toileting steps completed by helper: Adjust clothing prior to toileting, Performs perineal hygiene, Adjust clothing after toileting  Toileting assist Assist level: (total assist)   Transfers Chair/bed transfer   Chair/bed transfer method: Lateral scoot Chair/bed transfer assist level: Moderate assist (Pt 50 - 74%/lift or lower) Chair/bed transfer assistive device: Armrests, Sliding board Mechanical  lift: Stedy   Locomotion Ambulation Ambulation activity did not occur: Safety/medical concerns   Max distance: 15' Assist level: 2 helpers   Wheelchair   Type: Manual Max wheelchair distance: 100 Assist Level: Supervision or verbal cues  Cognition Comprehension Comprehension assist level: Understands complex 90% of the time/cues 10% of the time  Expression Expression assist level: Expresses basic 90% of the time/requires cueing < 10% of the time.  Social Interaction Social Interaction assist level: Interacts appropriately 90% of the time - Needs monitoring or encouragement for participation or interaction.  Problem Solving Problem solving assist level: Solves basic 75 - 89% of the time/requires cueing 10 - 24% of the time  Memory Memory assist level: Recognizes or recalls 75 - 89% of the time/requires cueing 10 - 24% of the time   Medical Problem List and Plan: 1. 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 Images reviewed.  2. DVT Prophylaxis/Anticoagulation: Subcutaneous Lovenox.  3. Pain Management: Oxycodone 5 mg every 6 hours as needed -fair control 4. Mood: reduced Clonazepam 0.25 mg daily at bedtime, trazodone 50 mg daily at bedtime, Seroquel 100 mg daily at bedtime, Zoloft 50 mg daily, amantadine 100 mg daily, Provigil 100 mg daily   -improved overall 5. Neuropsych: This patient iscapable of making decisions on hisown behalf. 6. Skin/Wound Care: Routine skin checks  - continue air mattress due to sensitive skin 7. Fluids/Electrolytes/Nutrition: Routine I&Os  -replacing k+   -recheck labs monday  8.Nondisplaced fractures of right T7 transverse process. Conservative care 9.Multiple pelvic fractures/bilateral sacroiliac joints and suprapubic symphysis as well as left  pelvic sidewall hematoma. Status post sacroiliac screw fixation with external fixation pelvis anterior pelvic ring closed reduction 02/07/2017. May ambulate extended distances weightbearing as tolerated without restrictions. 10.Seizure prophylaxis. Keppra 500 mg every 12 hours. EEG negative 11.Tracheostomy tube 03/01/2017 per Dr. Hulen Skains. Decannulated. 12.GI: Gastrostomy tube 03/01/2017 per Dr. Hulen Skains. Presently not receiving tube feeds.   -probably will remove PEG next week 13.Urinary retention/urethral injury. Status post suprapubic catheter placement 03/21/2017 per interventional radiology. Follow-up urology services Dr. Louis Meckel  -[facility]  changed cath out 04/27/2017  -completed 7 day course of Bactrim for UTI 14.Acute blood loss anemia. Continue iron supplement   Hemoglobin 9.7 on 11/9   Labs ordered for Monday 15. DM 2:   -on CM diet--changed back to D3/thin. Consider CM diet restriction   -amaryl 2mg  resumed with improvement   CBG (last 3)  Recent Labs    05/05/17 1709 05/05/17 2152 05/06/17 0656  GLUCAP 78 108* 119*   relatively controlled on 11/17  16. Tachycardia   -due to deconditioning   -Cont metoprolol  -Improving control   LOS (Days) 21 A FACE TO FACE EVALUATION WAS PERFORMED  Ankit Lorie Phenix, MD 05/06/2017 8:23 AM

## 2017-05-07 ENCOUNTER — Inpatient Hospital Stay (HOSPITAL_COMMUNITY): Payer: BLUE CROSS/BLUE SHIELD | Admitting: Physical Therapy

## 2017-05-07 DIAGNOSIS — O161 Unspecified maternal hypertension, first trimester: Secondary | ICD-10-CM

## 2017-05-07 DIAGNOSIS — Z931 Gastrostomy status: Secondary | ICD-10-CM

## 2017-05-07 LAB — GLUCOSE, CAPILLARY
GLUCOSE-CAPILLARY: 124 mg/dL — AB (ref 65–99)
GLUCOSE-CAPILLARY: 155 mg/dL — AB (ref 65–99)
Glucose-Capillary: 157 mg/dL — ABNORMAL HIGH (ref 65–99)
Glucose-Capillary: 220 mg/dL — ABNORMAL HIGH (ref 65–99)

## 2017-05-07 MED ORDER — METOPROLOL TARTRATE 50 MG PO TABS
50.0000 mg | ORAL_TABLET | Freq: Two times a day (BID) | ORAL | Status: DC
  Administered 2017-05-07 – 2017-05-19 (×24): 50 mg via ORAL
  Filled 2017-05-07 (×24): qty 1

## 2017-05-07 NOTE — Plan of Care (Signed)
  Progressing RH BOWEL ELIMINATION RH STG MANAGE BOWEL WITH ASSISTANCE Description STG Manage Bowel with Assistance. Mod  05/07/2017 1025 - Progressing by Ginette Pitman, RN RH SKIN INTEGRITY RH STG SKIN FREE OF INFECTION/BREAKDOWN Description Mod assist  05/07/2017 1025 - Progressing by Ginette Pitman, RN RH STG MAINTAIN SKIN INTEGRITY WITH ASSISTANCE Description STG Maintain Skin Integrity With Assistance. Mod assist  05/07/2017 1025 - Progressing by Ginette Pitman, RN RH PAIN MANAGEMENT RH STG PAIN MANAGED AT OR BELOW PT'S PAIN GOAL Description Less than 4.  05/07/2017 1025 - Progressing by Ginette Pitman, RN Consults [facility] GENERAL PATIENT EDUCATION Description See Patient Education module for education specifics. 05/07/2017 1025 - Progressing by Ginette Pitman, RN RH BOWEL ELIMINATION RH STG MANAGE BOWEL WITH ASSISTANCE Description STG Manage Bowel with Assistance. 05/07/2017 1025 - Progressing by Ginette Pitman, RN RH STG MANAGE BOWEL W/MEDICATION W/ASSISTANCE Description STG Manage Bowel with Medication with Assistance. 05/07/2017 1025 - Progressing by Ginette Pitman, RN RH STG MANAGE BOWEL W/EQUIPMENT W/ASSISTANCE Description STG Manage Bowel With Equipment With Assistance 05/07/2017 1025 - Progressing by Ginette Pitman, RN RH OTHER STG BOWEL ELIMINATION GOALS W/ASSIST Description Other STG Bowel Elimination Goals With Assistance. 05/07/2017 1025 - Progressing by Ginette Pitman, RN RH BLADDER ELIMINATION RH STG MANAGE BLADDER WITH ASSISTANCE Description STG Manage Bladder With Assistance 05/07/2017 1025 - Progressing by Ginette Pitman, RN RH STG MANAGE BLADDER WITH MEDICATION WITH ASSISTANCE Description STG Manage Bladder With Medication With Assistance. 05/07/2017 1025 - Progressing by Ginette Pitman, RN RH STG MANAGE BLADDER WITH EQUIPMENT WITH ASSISTANCE Description STG Manage Bladder With Equipment With  Assistance 05/07/2017 1025 - Progressing by Ginette Pitman, RN RH OTHER STG BLADDER ELIMINATION GOALS W/ASSIST Description Other STG Bladder Elimination Goals With Assistance 05/07/2017 1025 - Progressing by Ginette Pitman, RN RH SKIN INTEGRITY RH STG SKIN FREE OF INFECTION/BREAKDOWN 05/07/2017 1025 - Progressing by Ginette Pitman, RN RH STG MAINTAIN SKIN INTEGRITY WITH ASSISTANCE Description STG Maintain Skin Integrity With Assistance. 05/07/2017 1025 - Progressing by Ginette Pitman, RN RH OTHER STG SKIN INTEGRITY GOALS W/ASSIST Description Other STG Skin Integrity Goals With Assistance. 05/07/2017 1025 - Progressing by Ginette Pitman, RN RH SAFETY RH STG ADHERE TO SAFETY PRECAUTIONS W/ASSISTANCE/DEVICE Description STG Adhere to Safety Precautions With Assistance/Device. 05/07/2017 1025 - Progressing by Ginette Pitman, RN RH STG DECREASED RISK OF FALL WITH ASSISTANCE Description STG Decreased Risk of Fall With Assistance. 05/07/2017 1025 - Progressing by Ginette Pitman, RN RH STG [place] 05/07/2017 1025 - Progressing by Ginette Pitman, RN RH OTHER STG SAFETY GOALS W/ASSIST Description Other STG Safety Goals With Assistance. 05/07/2017 1025 - Progressing by Ginette Pitman, RN RH PAIN MANAGEMENT RH STG PAIN MANAGED AT OR BELOW PT'S PAIN GOAL 05/07/2017 1025 - Progressing by Ginette Pitman, RN RH OTHER STG PAIN MANAGEMENT GOALS W/ASSIST Description Other STG Pain Management Goals With Assistance. 05/07/2017 1025 - Progressing by Ginette Pitman, RN

## 2017-05-07 NOTE — Progress Notes (Signed)
Physical Therapy Session Note  Patient Details  Name: Peter [name] MRN: [id number] Date of Birth: Jan 01, 1961  Today's Date: 05/07/2017 PT Individual Time: 0700-0800 PT Individual Time Calculation (min): 60 min   Short Term Goals: Week 3:  PT Short Term Goal 1 (Week 3): pt will perform bed mobility with minA PT Short Term Goal 2 (Week 3): Pt will perform slide board transfers with consistent minA PT Short Term Goal 3 (Week 3): Pt will demonstrate w/c propulsion x100' with consistent S PT Short Term Goal 4 (Week 3): Pt will demonstrate sit <>stand with modA  Skilled Therapeutic Interventions/Progress Updates: Pt received seated in bed, denies pain and agreeable to treatment. Rolling R/L minA to doff/don pants and brief d/t bowel incontinence. [place] for hygiene and clothing management. Supine>sit with logroll and modA. Sit >stand in stedy modA; maxA for pulling up pants. W/c propulsion x150' with BUE and S; increased time. Pt performed self-feeding at tabletop in day room with occasional setupA; encouraged pt to attempt opening containers first before requesting assistance. Discussed dysphagia 3 diet, goals for d/c home, and recommendation to maintain performance of LE strengthening/stretching HEP on days with less therapy (today on weekend, thanksgiving day, etc), as well as w/c propulsion within unit to continue strengthening LEs. Remained seated in w/c in day room with daughter present at end of session to finish eating; instructed pt to self-propel w/c back to room and pt agreeable.      Therapy Documentation Precautions:  Precautions Precautions: Fall Precaution Comments: trach, PEG, catheter  Required Braces or Orthoses: Other Brace/Splint Other Brace/Splint: bil PRAFO, abdominal binder, bil hand splints at night Restrictions Weight Bearing Restrictions: Yes RLE Weight Bearing: Weight bearing as tolerated LLE Weight Bearing: Weight bearing as tolerated Other Position/Activity  Restrictions: no ROM restrictions bil LE  See Function Navigator for Current Functional Status.   Therapy/Group: Individual Therapy  Luberta Mutter 05/07/2017, 7:58 AM

## 2017-05-07 NOTE — Progress Notes (Signed)
[place] PHYSICAL MEDICINE & REHABILITATION     PROGRESS NOTE    Subjective/Complaints: Pt seen laying in bed this AM.  He slept well overnight. Family at bedside.  He is looking forward to his dty of relative rest.    ROS: Denies nausea, vomiting, diarrhea, shortness of breath or chest pain   Objective: Vital Signs: Blood pressure (!) 153/83, pulse 92, temperature 97.8 F (36.6 C), temperature source Oral, resp. rate 16, height 5\' 10"  (1.778 m), weight 68.1 kg (150 lb 3 oz), SpO2 97 %. No results found. No results for input(s): WBC, HGB, HCT, PLT in the last 72 hours. No results for input(s): NA, K, CL, GLUCOSE, BUN, CREATININE, CALCIUM in the last 72 hours.  Invalid input(s): CO CBG (last 3)  Recent Labs    05/06/17 1658 05/06/17 2115 05/07/17 0643  GLUCAP 69 114* 124*    Wt Readings from Last 3 Encounters:  05/03/17 68.1 kg (150 lb 3 oz)  03/23/17 74.4 kg (164 lb 0.4 oz)    Physical Exam:  Constitutional: NAD. Vital signs reviewed. HENT: Normocephalicand atraumatic.  Eyes: EOMare normal. No discharge.  Cardiovascular: RRR No JVD  Respiratory: CTA Bilaterally Normal effort  GI: BS+. ND Skin. Warm and dry, intact Musculoskeletal. No edema. No tenderness.  Genitourinary.  + Suprapubic cath Neurological. Alert and oriented 3.  Follows simple commands.   B/l UE Bilateral 3+/5 Deltoid, biceps, triceps and 1/5 HI.  B/l LE: 2/5 HF, KE, ADF,APF.  Psych: Affect flat.   Assessment/Plan: 1. 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 Physiatrist is providing close team supervision and 24 hour management of active medical problems listed below. Physiatrist and rehab team continue to assess barriers to discharge/monitor patient progress toward functional and medical goals.  Function:  Bathing Bathing position  Bathing activity did not occur: N/A(night bath) Position: Bed  Bathing parts Body parts bathed by patient: Chest, Abdomen Body parts bathed by helper: Right arm, Right lower leg, Left arm, Left lower leg, Chest, Abdomen, Back, Front perineal area, Buttocks, Right upper leg, Left upper leg  Bathing assist Assist Level: Touching or steadying assistance(Pt > 75%)      Upper Body Dressing/Undressing Upper body dressing   What is the patient wearing?: Pull over shirt/dress     Pull over shirt/dress - Perfomed by patient: Thread/unthread right sleeve Pull over shirt/dress - Perfomed by helper: Thread/unthread left sleeve, Put head through opening, Pull shirt over trunk        Upper body assist Assist Level: Touching or steadying assistance(Pt > 75%)      Lower Body Dressing/Undressing Lower body dressing   What is the patient wearing?: Pants, Non-skid slipper socks, Ted Hose       Pants- Performed by helper: Thread/unthread right pants leg, Thread/unthread left pants leg, Pull pants up/down Non-skid slipper socks- Performed by patient: Don/doff right sock, Don/doff left sock Non-skid slipper socks- Performed by helper: Don/doff right sock, Don/doff left sock               TED Hose - Performed by helper: Don/doff right TED hose, Don/doff left TED hose  Lower body assist Assist for lower body dressing: Touching or steadying assistance (Pt > 75%)      Toileting Toileting   Toileting steps completed by patient: Adjust clothing prior to toileting, Adjust clothing after toileting Toileting steps completed by helper: Adjust clothing prior to toileting,  Performs perineal hygiene, Adjust clothing after toileting    Toileting assist Assist level: (total assist)   Transfers Chair/bed transfer   Chair/bed transfer method: Lateral scoot Chair/bed transfer assist level: Moderate assist (Pt 50 - 74%/lift or lower) Chair/bed transfer assistive device: Armrests, Sliding board Mechanical  lift: Stedy   Locomotion Ambulation Ambulation activity did not occur: Safety/medical concerns   Max distance: 15' Assist level: 2 helpers   Wheelchair   Type: Manual Max wheelchair distance: 150 Assist Level: Supervision or verbal cues  Cognition Comprehension Comprehension assist level: Understands complex 90% of the time/cues 10% of the time  Expression Expression assist level: Expresses basic 90% of the time/requires cueing < 10% of the time.  Social Interaction Social Interaction assist level: Interacts appropriately 90% of the time - Needs monitoring or encouragement for participation or interaction.  Problem Solving Problem solving assist level: Solves basic 75 - 89% of the time/requires cueing 10 - 24% of the time  Memory Memory assist level: Recognizes or recalls 75 - 89% of the time/requires cueing 10 - 24% of the time   Medical Problem List and Plan: 1. 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 DVT Prophylaxis/Anticoagulation: Subcutaneous Lovenox.  3. Pain Management: Oxycodone 5 mg every 6 hours as needed -fair control 4. Mood: reduced Clonazepam 0.25 mg daily at bedtime, trazodone 50 mg daily at bedtime, Seroquel 100 mg daily at bedtime, Zoloft 50 mg daily, amantadine 100 mg daily, Provigil 100 mg daily   -improved overall 5. Neuropsych: This patient iscapable of making decisions on hisown behalf. 6. Skin/Wound Care: Routine skin checks  - continue air mattress due to sensitive skin 7. Fluids/Electrolytes/Nutrition: Routine I&Os  -replacing k+   -recheck labs tomorrow  8.Nondisplaced fractures of right T7 transverse process. Conservative care 9.Multiple pelvic fractures/bilateral sacroiliac joints and suprapubic symphysis as well as left pelvic sidewall hematoma. Status post  sacroiliac screw fixation with external fixation pelvis anterior pelvic ring closed reduction 02/07/2017. May ambulate extended distances weightbearing as tolerated without restrictions. 10.Seizure prophylaxis. Keppra 500 mg every 12 hours. EEG negative 11.Tracheostomy tube 03/01/2017 per Dr. Hulen Skains. Decannulated. 12.GI: Gastrostomy tube 03/01/2017 per Dr. Hulen Skains. Presently not receiving tube feeds.   -?remove PEG this week 13.Urinary retention/urethral injury. Status post suprapubic catheter placement 03/21/2017 per interventional radiology. Follow-up urology services Dr. Louis Meckel  -[facility]  changed cath out 04/27/2017  -completed 7 day course of Bactrim for UTI 14.Acute blood loss anemia. Continue iron supplement   Hemoglobin 9.7 on 11/9   Labs ordered for tomorrow 15. DM 2:   -on CM diet--changed back to D3/thin. Consider CM diet restriction   -amaryl 2mg  resumed with improvement   CBG (last 3)  Recent Labs    05/06/17 1658 05/06/17 2115 05/07/17 0643  GLUCAP 69 114* 124*   relatively controlled on 11/18  16. Tachycardia   -due to deconditioning   -metoprolol increased on 11/18  -Improving control  17. Elevated blood pressure  Trending up, metoprolol increased to 50 on 11/18  LOS (Days) 22 A FACE TO FACE EVALUATION WAS PERFORMED  Ankit Lorie Phenix, MD 05/07/2017 8:06 AM

## 2017-05-08 ENCOUNTER — Inpatient Hospital Stay (HOSPITAL_COMMUNITY): Payer: BLUE CROSS/BLUE SHIELD

## 2017-05-08 ENCOUNTER — Inpatient Hospital Stay (HOSPITAL_COMMUNITY): Payer: BLUE CROSS/BLUE SHIELD | Admitting: Physical Therapy

## 2017-05-08 DIAGNOSIS — G7281 Critical illness myopathy: Secondary | ICD-10-CM

## 2017-05-08 LAB — BASIC METABOLIC PANEL
ANION GAP: 6 (ref 5–15)
BUN: 17 mg/dL (ref 6–20)
CHLORIDE: 108 mmol/L (ref 101–111)
CO2: 25 mmol/L (ref 22–32)
CREATININE: 0.63 mg/dL (ref 0.61–1.24)
Calcium: 8.6 mg/dL — ABNORMAL LOW (ref 8.9–10.3)
GFR calc non Af Amer: >60 mL/min (ref >60)
Glucose, Bld: 100 mg/dL — ABNORMAL HIGH (ref 65–99)
POTASSIUM: 4.1 mmol/L (ref 3.5–5.1)
Sodium: 139 mmol/L (ref 135–145)

## 2017-05-08 LAB — GLUCOSE, CAPILLARY
GLUCOSE-CAPILLARY: 97 mg/dL (ref 65–99)
GLUCOSE-CAPILLARY: 131 mg/dL — AB (ref 65–99)
Glucose-Capillary: 244 mg/dL — ABNORMAL HIGH (ref 65–99)
Glucose-Capillary: 100 mg/dL — ABNORMAL HIGH (ref 65–99)

## 2017-05-08 LAB — CBC
HEMATOCRIT: 31.9 % — AB (ref 39.0–52.0)
Hemoglobin: 10 g/dL — ABNORMAL LOW (ref 13.0–17.0)
MCH: 28.2 pg (ref 26.0–34.0)
MCHC: 31.3 g/dL (ref 30.0–36.0)
MCV: 89.9 fL (ref 78.0–100.0)
PLATELETS: 459 10*3/uL — AB (ref 150–400)
RBC: 3.55 MIL/uL — ABNORMAL LOW (ref 4.22–5.81)
RDW: 14.7 % (ref 11.5–15.5)
WBC: 6.4 10*3/uL (ref 4.0–10.5)

## 2017-05-08 NOTE — Progress Notes (Signed)
Appears to be resting with eyes closed, easily aroused respiration even and unlabored. Denies discomfort or c/o pain , repositioned for comfort, coloration adequate. Continue medical regime, call bell within reach. Bed safety maintained. Sister at bedside

## 2017-05-08 NOTE — Progress Notes (Signed)
Physical Therapy Session Note  Patient Details  Name: Peter [name] MRN: [id number] Date of Birth: 10/18/1960  Today's Date: 05/08/2017 PT Individual Time: 0131-4388 PT Individual Time Calculation (min): 70 min   Short Term Goals: Week 3:  PT Short Term Goal 1 (Week 3): pt will perform bed mobility with minA PT Short Term Goal 2 (Week 3): Pt will perform slide board transfers with consistent minA PT Short Term Goal 3 (Week 3): Pt will demonstrate w/c propulsion x100' with consistent S PT Short Term Goal 4 (Week 3): Pt will demonstrate sit <>stand with modA  Skilled Therapeutic Interventions/Progress Updates:    Session focused on NMR to address motor control, strength, postural control, and balance using Lite gait for gait training, sit <> stands, and terminal sit <> stands. Pt requires max assist for sit <> stand from w/c for positioning and removal of sling. Donned shoes today for gait trials which made it more difficult for pt to place and advance LE's during gait due to the weight of shoes but improved with practice. First trial with pt holding on to handles on Lite Gait able to go x 15' and x 10' before seated rest break with PT providing facilitation and assistance with LLE placement and coordination/control as well as approximation at knee for stance phase. Pt also requires tactile cues (and verbal) for upright posture and trunk extension as pt demonstrating significant proximal weakness at pelvis and trunk. Next attempted with Lite gait facing otherway and pt using RW for support - initially requiring total assist to come into full upright position as pt now with decreased ability to compensate with UE's and addressed terminal sit <> stands with facilitation and quads and through trunk. Seated rest break needed and then trial again with progressing to gait (more support provided through Lite Gait than before to increase pt success with activity) and pt able to gait with RW x 12' - smaller  step length but improved ability to perform reciprocal movement through BLE due to increased support. Pt reports significant fatigue but excited about progress. Education with pt and sister about progression of gait, proximal weakness, and overall impairments and slow recovery due to the above and time already in hospital/debility. Verbalized understanding and in agreement. Discussed with sister also the importance of continued mobility upon discharge.   Therapy Documentation Precautions:  Precautions Precautions: Fall Precaution Comments: decannulated, PEG, catheter  Required Braces or Orthoses: Other Brace/Splint Other Brace/Splint: bil PRAFO, abdominal binder, bil hand splints at night Restrictions Weight Bearing Restrictions: Yes RLE Weight Bearing: Weight bearing as tolerated LLE Weight Bearing: Weight bearing as tolerated Other Position/Activity Restrictions: no ROM restrictions bil LE   Pain:  Denies pain.   See Function Navigator for Current Functional Status.   Therapy/Group: Individual Therapy  [name], PT, DPT  05/08/2017, 12:08 PM

## 2017-05-08 NOTE — Progress Notes (Signed)
Occupational Therapy Session Note  Patient Details  Name: Peter [name] MRN: [id number] Date of Birth: 11/08/1960  Today's Date: 05/08/2017 OT Individual Time: 5859-2924 OT Individual Time Calculation (min): 55 min    Short Term Goals: Week 3:  OT Short Term Goal 1 (Week 3): Pt will perform squat pivot transfer to [facility] with max A OT Short Term Goal 2 (Week 3): Pt will perform LB dressing tasks with max A sit<>stand. OT Short Term Goal 3 (Week 3): Pt will perform UB dressing tasks seated EOB with min A  Skilled Therapeutic Interventions/Progress Updates:    Pt resting in w/c upon arrival with sister present.  Pt required min A for supine>sit EOM in preparation for transfer to w/c. Pt required mod A for sit>stand in Stedy to transfer to w/c for grooming tasks at sink.  Pt engaged in grooming tasks including washing face, oral care, washing hair with shampoo cap, and shaving with electric razor.  Pt requires assistance for thoroughness only during tasks.  Pt also initiated brushing hair.  Pt engaged BUE into all tasks with noted improvement, LUE>RUE. Pt remained seated in w/c with all needs within reach and sister present.   Therapy Documentation Precautions:  Precautions Precautions: Fall Precaution Comments: trach, PEG, catheter  Required Braces or Orthoses: Other Brace/Splint Other Brace/Splint: bil PRAFO, abdominal binder, bil hand splints at night Restrictions Weight Bearing Restrictions: Yes RLE Weight Bearing: Weight bearing as tolerated LLE Weight Bearing: Weight bearing as tolerated Other Position/Activity Restrictions: no ROM restrictions bil LE  Pain:  Pt denies pain  See Function Navigator for Current Functional Status.   Therapy/Group: Individual Therapy  [name] 05/08/2017, 11:19 AM

## 2017-05-08 NOTE — Progress Notes (Signed)
[place] PHYSICAL MEDICINE & REHABILITATION     PROGRESS NOTE    Subjective/Complaints: No new complaints.  Quiet weekend.  Sister reports left knee with some swelling and "locking up".  He did not indicate that he had pain there morning.  Belly is feeling better  ROS: pt denies nausea, vomiting, diarrhea, cough, shortness of breath or chest pain   Objective: Vital Signs: Blood pressure (!) 154/80, pulse 92, temperature 98 F (36.7 C), temperature source Oral, resp. rate 17, height 5\' 10"  (1.778 m), weight 68.1 kg (150 lb 3 oz), SpO2 99 %. No results found. Recent Labs    05/08/17 0533  WBC 6.4  HGB 10.0*  HCT 31.9*  PLT 459*   Recent Labs    05/08/17 0533  NA 139  K 4.1  CL 108  GLUCOSE 100*  BUN 17  CREATININE 0.63  CALCIUM 8.6*   CBG (last 3)  Recent Labs    05/07/17 1644 05/07/17 2043 05/08/17 0622  GLUCAP 157* 220* 97    Wt Readings from Last 3 Encounters:  05/03/17 68.1 kg (150 lb 3 oz)  03/23/17 74.4 kg (164 lb 0.4 oz)    Physical Exam:  Constitutional: NAD. Vital signs reviewed. HENT: Normocephalicand atraumatic.  Eyes: EOMare normal. No discharge.  Cardiovascular: RRR without murmur. No JVD  Respiratory: CTA Bilaterally without wheezes or rales. Normal effort   GI: BS+. ND.  His PEG is intact Skin. Warm and dry, intact Musculoskeletal. No edema. No tenderness.  Genitourinary.  + Suprapubic cath in place Neurological. Alert and oriented 3.  Follows simple commands.   B/l UE Bilateral 3+/5 Deltoid, biceps, triceps and 1/5 HI.  B/l LE: 2/5 HF, KE, ADF,APF--no change. Generalized muscle wasting.   Psych: Affect flat.   Assessment/Plan: 1. 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 Physiatrist is providing close team supervision and 24 hour management of active medical problems listed  below. Physiatrist and rehab team continue to assess barriers to discharge/monitor patient progress toward functional and medical goals.  Function:  Bathing Bathing position Bathing activity did not occur: N/A(night bath) Position: Bed  Bathing parts Body parts bathed by patient: Chest, Abdomen Body parts bathed by helper: Right arm, Right lower leg, Left arm, Left lower leg, Chest, Abdomen, Back, Front perineal area, Buttocks, Right upper leg, Left upper leg  Bathing assist Assist Level: Touching or steadying assistance(Pt > 75%)      Upper Body Dressing/Undressing Upper body dressing   What is the patient wearing?: Pull over shirt/dress     Pull over shirt/dress - Perfomed by patient: Thread/unthread right sleeve Pull over shirt/dress - Perfomed by helper: Thread/unthread left sleeve, Put head through opening, Pull shirt over trunk        Upper body assist Assist Level: Touching or steadying assistance(Pt > 75%)      Lower Body Dressing/Undressing Lower body dressing   What is the patient wearing?: Pants, Non-skid slipper socks, Ted Hose       Pants- Performed by helper: Thread/unthread right pants leg, Thread/unthread left pants leg, Pull pants up/down Non-skid slipper socks- Performed by patient: Don/doff right sock, Don/doff left sock Non-skid slipper socks- Performed by helper: Don/doff right sock, Don/doff left sock               TED Hose - Performed by helper: Don/doff right TED hose, Don/doff left TED hose  Lower body assist Assist  for lower body dressing: Touching or steadying assistance (Pt > 75%)      Toileting Toileting   Toileting steps completed by patient: Adjust clothing prior to toileting, Adjust clothing after toileting Toileting steps completed by helper: Adjust clothing prior to toileting, Performs perineal hygiene, Adjust clothing after toileting    Toileting assist Assist level: (total assist)   Transfers Chair/bed transfer   Chair/bed  transfer method: Lateral scoot Chair/bed transfer assist level: Moderate assist (Pt 50 - 74%/lift or lower) Chair/bed transfer assistive device: Armrests, Sliding board Mechanical lift: Stedy   Locomotion Ambulation Ambulation activity did not occur: Safety/medical concerns   Max distance: 15' Assist level: 2 helpers   Wheelchair   Type: Manual Max wheelchair distance: 150 Assist Level: Supervision or verbal cues  Cognition Comprehension Comprehension assist level: Understands complex 90% of the time/cues 10% of the time, Follows complex conversation/direction with extra time/assistive device  Expression Expression assist level: Expresses complex 90% of the time/cues < 10% of the time  Social Interaction Social Interaction assist level: Interacts appropriately 90% of the time - Needs monitoring or encouragement for participation or interaction.  Problem Solving Problem solving assist level: Solves complex 90% of the time/cues < 10% of the time  Memory Memory assist level: Recognizes or recalls 75 - 89% of the time/requires cueing 10 - 24% of the time   Medical Problem List and Plan: 1. 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 DVT Prophylaxis/Anticoagulation: Subcutaneous Lovenox.  3. Pain Management: Oxycodone 5 mg every 6 hours as needed -fair control 4. Mood: reduced Clonazepam 0.25 mg daily at bedtime, trazodone 50 mg daily at bedtime, Seroquel 100 mg daily at bedtime, Zoloft 50 mg daily, amantadine 100 mg daily, Provigil 100 mg daily   -improved overall 5. Neuropsych: This patient iscapable of making decisions on hisown behalf. 6. Skin/Wound Care: Routine skin checks  - continue air mattress due to sensitive skin 7. Fluids/Electrolytes/Nutrition: Routine I&Os  -replacing k+    -Potassium level normal today  8.Nondisplaced fractures of right T7 transverse process. Conservative care 9.Multiple pelvic fractures/bilateral sacroiliac joints and suprapubic symphysis as well as left pelvic sidewall hematoma. Status post sacroiliac screw fixation with external fixation pelvis anterior pelvic ring closed reduction 02/07/2017. May ambulate extended distances weightbearing as tolerated without restrictions. 10.Seizure prophylaxis. Keppra 500 mg every 12 hours. EEG negative 11.Tracheostomy tube 03/01/2017 per Dr. Hulen Skains. Decannulated. 12.GI: Gastrostomy tube 03/01/2017 per Dr. Hulen Skains. Presently not receiving tube feeds.   -remove PEG either Wednesday or Friday mornings 13.Urinary retention/urethral injury. Status post suprapubic catheter placement 03/21/2017 per interventional radiology. Follow-up urology services Dr. Louis Meckel  -[facility]  changed cath out 04/27/2017  -completed 7 day course of Bactrim for UTI 14.Acute blood loss anemia. Continue iron supplement   Hemoglobin up to 10.0 today     15. DM 2:   - on D3/thin.  We will add carb modified restriction  -amaryl 2mg  resumed with improvement   CBG (last 3)  Recent Labs    05/07/17 1644 05/07/17 2043 05/08/17 0622  GLUCAP 157* 220* 97     16. Tachycardia   -due to deconditioning   -metoprolol increased on 11/18  -Improving control  17. Elevated blood pressure   metoprolol increased to 50 on 11/18  LOS (Days) 23 A FACE TO FACE EVALUATION WAS PERFORMED  [name], MD 05/08/2017 9:14 AM

## 2017-05-08 NOTE — Progress Notes (Signed)
Physical Therapy Session Note  Patient Details  Name: Peter [name] MRN: [id number] Date of Birth: 1960/08/01  Today's Date: 05/08/2017 PT Individual Time: 1400-1500 PT Individual Time Calculation (min): 60 min   Short Term Goals: Week 3:  PT Short Term Goal 1 (Week 3): pt will perform bed mobility with minA PT Short Term Goal 2 (Week 3): Pt will perform slide board transfers with consistent minA PT Short Term Goal 3 (Week 3): Pt will demonstrate w/c propulsion x100' with consistent S PT Short Term Goal 4 (Week 3): Pt will demonstrate sit <>stand with modA  Skilled Therapeutic Interventions/Progress Updates: Pt received seated in w/c, denies pain and agreeable to treatment. W/c propulsion x100' with BUE for strengthening and endurance, S overall, slow speed. Slideboard transfer w/c <>mat table minA for board placement and initial scoot onto board. Sit <>supine modA for LE/trunk management. BUE PROM to pec/shoulder abduction, shoulder flexion, shoulder internal/external rotation. Performed chest press and tricep extensions with 4# weighted bar 2x10 reps each. Bridging x10 reps; cues for full ROM and glute activation. LEs on stability ball performed hooklying trunk rotation for core activation, glute bridging, heel slides for hamstring activation. Eccentric/concentric control of L hip adductors in hookyling with AAROM. Sit <>stand from elevated mat table with RW x1 trial with maxA to boost into standing, min guard for static standing balance x20 sec; cues for glute activation. Returned to w/c as above, remained seated in w/c at end of session, all needs in reach.      Therapy Documentation Precautions:  Precautions Precautions: Fall Precaution Comments: trach, PEG, catheter  Required Braces or Orthoses: Other Brace/Splint Other Brace/Splint: bil PRAFO, abdominal binder, bil hand splints at night Restrictions Weight Bearing Restrictions: Yes RLE Weight Bearing: Weight bearing as  tolerated LLE Weight Bearing: Weight bearing as tolerated Other Position/Activity Restrictions: no ROM restrictions bil LE Pain: Pain Assessment Pain Assessment: 0-10 Pain Score: 0-No pain   See Function Navigator for Current Functional Status.   Therapy/Group: Individual Therapy  Luberta Mutter 05/08/2017, 3:57 PM

## 2017-05-09 ENCOUNTER — Inpatient Hospital Stay (HOSPITAL_COMMUNITY): Payer: BLUE CROSS/BLUE SHIELD

## 2017-05-09 ENCOUNTER — Inpatient Hospital Stay (HOSPITAL_COMMUNITY): Payer: BLUE CROSS/BLUE SHIELD | Admitting: Physical Therapy

## 2017-05-09 LAB — GLUCOSE, CAPILLARY
GLUCOSE-CAPILLARY: 138 mg/dL — AB (ref 65–99)
GLUCOSE-CAPILLARY: 124 mg/dL — AB (ref 65–99)
Glucose-Capillary: 184 mg/dL — ABNORMAL HIGH (ref 65–99)
Glucose-Capillary: 110 mg/dL — ABNORMAL HIGH (ref 65–99)

## 2017-05-09 NOTE — Progress Notes (Signed)
Pt resting in bed quietly. Easily aroused any pain at this time. Cath care provided. abd drsg c/d/i. Able to make needs known. praflo boot noted to left foot does not fit foot due to severity of foot extraversion. Wife states "I have been telling them that it doesn't fit. I would put it on and he would complain for me to take it back off, so I do in the night." Safety maintained. Will continue to monitor.

## 2017-05-09 NOTE — Progress Notes (Signed)
Physical Therapy Session Note  Patient Details  Name: Peter [name] MRN: [id number] Date of Birth: 1960-12-28  Today's Date: 05/09/2017 PT Individual Time: 1631-1701 PT Individual Time Calculation (min): 30 min   Short Term Goals: Week 3:  PT Short Term Goal 1 (Week 3): pt will perform bed mobility with minA PT Short Term Goal 2 (Week 3): Pt will perform slide board transfers with consistent minA PT Short Term Goal 3 (Week 3): Pt will demonstrate w/c propulsion x100' with consistent S PT Short Term Goal 4 (Week 3): Pt will demonstrate sit <>stand with modA  Skilled Therapeutic Interventions/Progress Updates:    Pt seated in w/c upon PT arrival, agreeable to therapy tx and denies pain. Session focused on sit<>stands, LE strengthening and standing tolerance/balance. Pt worked on sit<>stands within the stedy. First 3 attempts pt unable to fully come up to standing. After that pt performed x 2 sit<>stands from w/c surface with mod assist. Pt performed x 4 sit<>stands from elevated seat of the stedy. Once in standing pt able to maintain 30-60 secs before needing to sit. In standing worked on static standing balance without UE support, letting go briefly from the stedy while also working on hip/knee extension without relying on knee block. Pt transferred back to bed and sitting>supine with mod assist. Pt left supine in bed with needs in reach.   Therapy Documentation Precautions:  Precautions Precautions: Fall Precaution Comments: trach, PEG, catheter  Required Braces or Orthoses: Other Brace/Splint Other Brace/Splint: bil PRAFO, abdominal binder, bil hand splints at night Restrictions Weight Bearing Restrictions: No RLE Weight Bearing: Weight bearing as tolerated LLE Weight Bearing: Weight bearing as tolerated Other Position/Activity Restrictions: no ROM restrictions bil LE   See Function Navigator for Current Functional Status.   Therapy/Group: Individual Therapy  Netta Corrigan, PT, DPT 05/09/2017, 7:57 AM

## 2017-05-09 NOTE — Progress Notes (Addendum)
Physical Therapy Session Note  Patient Details  Name: Peter [name] MRN: [id number] Date of Birth: [birth year]  Today's Date: 05/09/2017 PT Individual Time: 1631-1701 PT Individual Time Calculation (min): 30 min   Short Term Goals: Week 4:  PT Short Term Goal 1 (Week 4): =LTG due to estimated LOS; minA overall from w/c level  Skilled Therapeutic Interventions/Progress Updates:    Patient in w/c propelled partway to gym with S and increased time.  Transfer to mat via slide board mod A for intiating and scooting with total A for board placement.  Patient sit to supine for LE therex as noted below.  Assisted ambulation with Harmon Pier walker with sky lift walking sling max A.  Noted improved foot placement during gait this session than at last attempt.  Patient propelled partway back to room and left in chair with all needs in reach.   Therapy Documentation Precautions:  Precautions Precautions: Fall Precaution Comments: trach, PEG, catheter  Required Braces or Orthoses: Other Brace/Splint Other Brace/Splint: bil PRAFO, abdominal binder, bil hand splints at night Restrictions Weight Bearing Restrictions: (P) No RLE Weight Bearing: (P) Weight bearing as tolerated LLE Weight Bearing: (P) Weight bearing as tolerated Other Position/Activity Restrictions: no ROM restrictions bil LE Pain: Pain Assessment Pain Score: 0-No pain Exercises: General Exercises - Lower Extremity Long Arc Quad: AROM;15 reps;Seated Hip Flexion/Marching: 10 reps;AAROM;Other (comment)(hooklying) Other Exercises Other Exercises: bridging 2 x 10  Other Exercises: sidelying clamshells 2 x 10 Other Exercises: sidelying on powder board hip flexion/extension 2 x 10 AROM Other Exercises: sidelying on powder board knee flexion/extension x 10 AROM Other Treatments:     See Function Navigator for Current Functional Status.   Therapy/Group: Individual Therapy  Reginia Naas 05/09/2017, 5:12 PM

## 2017-05-09 NOTE — Consult Note (Signed)
[place] Nurse wound follow up Wound type: ? Device related pressure injury from SP catheter, combination of friction from being taped down.  Measurement: Right thigh 2.5cm x 1.5cm x 0.1cm: 100% fully granulating, pink, moist  Other areas are healed.  Was not able to look at penile wound today. Pt was up in the chair for therapy with brief on at the time of my assessment Wound bed: see above  Drainage (amount, consistency, odor) minimal, no odor Periwound: intact, evidence of healing in the groin area. Dressing procedure/placement/frequency: Continue silicone foam to the thigh wound, to protect and insulate.  Continue xeroform for the penile wound, WOC nurse will follow up with bedside nurse today on status of this wound.  [place] Nurse team will follow along with you for weekly wound assessments.  Please notify me of any acute changes in the wounds or any new areas of concerns Bruno MSN, RN,CWOCN, [place], [place]

## 2017-05-09 NOTE — Plan of Care (Signed)
  RH Ambulation LTG Patient will ambulate in controlled environment (PT) Description LTG: Patient will ambulate in a controlled environment, # of feet with assistance (PT). 05/09/2017 0730 by Luberta Mutter, PT Flowsheets Taken 05/09/2017 0730  LTG: Pt will ambulate in controlled environ  assist needed: 3 - Moderate Assistance LTG: Ambulation distance in controlled environment 20' with lift equipment as needed LTG: Pt will ambulate in controlled environ cueing needed: Moderate cueing LTG Patient will ambulate in home environment (PT) Description LTG: Patient will ambulate in home environment, # of feet with assistance (PT). [id number] - Not Applicable by Luberta Mutter, PT Flowsheets Taken 05/09/2017 0730  LTG: Pt will ambulate in home environ  assist needed: -- (d/c due to slow progress) Note D/c due to slow progress

## 2017-05-09 NOTE — Progress Notes (Signed)
Occupational Therapy Session Note  Patient Details  Name: Happy Ky MRN: [id number] Date of Birth: August 10, 1960  Today's Date: 05/09/2017 OT Individual Time: 0938-1829 OT Individual Time Calculation (min): 55 min    Short Term Goals: Week 3:  OT Short Term Goal 1 (Week 3): Pt will perform squat pivot transfer to [facility] with max A OT Short Term Goal 2 (Week 3): Pt will perform LB dressing tasks with max A sit<>stand. OT Short Term Goal 3 (Week 3): Pt will perform UB dressing tasks seated EOB with min A  Skilled Therapeutic Interventions/Progress Updates:    Pt resting in bed upon arrival.  Focus on bed mobility (min A) and supine>sit (min A), sitting balance, transfer with Stedy and grooming tasks at sink.  Pt was able to complete all grooming tasks without assistance this morning.  Pt transitioned to BUE therex on [employer].  Pt able to grasp handles with improved function this morning and maintain RPM of 40/min.  Pt propelled w/c back to room with min A and remained in w/c with sister present.    Therapy Documentation Precautions:  Precautions Precautions: Fall Precaution Comments: trach, PEG, catheter  Required Braces or Orthoses: Other Brace/Splint Other Brace/Splint: bil PRAFO, abdominal binder, bil hand splints at night Restrictions Weight Bearing Restrictions: No RLE Weight Bearing: Weight bearing as tolerated LLE Weight Bearing: Weight bearing as tolerated Other Position/Activity Restrictions: no ROM restrictions bil LE  Pain:  Pt denies pain  See Function Navigator for Current Functional Status.   Therapy/Group: Individual Therapy  [name] 05/09/2017, 12:06 PM

## 2017-05-09 NOTE — Progress Notes (Addendum)
[place] PHYSICAL MEDICINE & REHABILITATION     PROGRESS NOTE    Subjective/Complaints: No new problems overnight.  Slept well.  Denies pain.  Asked again when his PEG is going to come out  ROS: pt denies nausea, vomiting, diarrhea, cough, shortness of breath or chest pain   Objective: Vital Signs: Blood pressure (!) 151/83, pulse 90, temperature 97.9 F (36.6 C), temperature source Oral, resp. rate 18, height 5\' 10"  (1.778 m), weight 68.1 kg (150 lb 3 oz), SpO2 97 %. No results found. Recent Labs    05/08/17 0533  WBC 6.4  HGB 10.0*  HCT 31.9*  PLT 459*   Recent Labs    05/08/17 0533  NA 139  K 4.1  CL 108  GLUCOSE 100*  BUN 17  CREATININE 0.63  CALCIUM 8.6*   CBG (last 3)  Recent Labs    05/08/17 1648 05/08/17 2126 05/09/17 0656  GLUCAP 100* 131* 138*    Wt Readings from Last 3 Encounters:  05/03/17 68.1 kg (150 lb 3 oz)  03/23/17 74.4 kg (164 lb 0.4 oz)    Physical Exam:  Constitutional: NAD. Vital signs reviewed. HENT: Normocephalicand atraumatic.  Eyes: EOMare normal. No discharge.  Cardiovascular: RRR without murmur. No JVD   Respiratory: CTA Bilaterally without wheezes or rales. Normal effort   GI: BS +, non-tender, non-distended PEG intact Skin. Warm and dry, intact Musculoskeletal. No edema. No tenderness.  Genitourinary.  + Suprapubic cath in place and draining Neurological. Alert and oriented 3.  Follows simple commands.   B/l UE Bilateral 3+/5 Deltoid, biceps, triceps and 3-4/5 HI.  B/l LE: 2/5 HF, KE,3-/5  ADF,APF--  Generalized muscle wasting.   Psych: Affect flat.  Engages a bit more  Assessment/Plan: 1. 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 Physiatrist is providing close team supervision and 24 hour management of active medical problems listed below. Physiatrist and  rehab team continue to assess barriers to discharge/monitor patient progress toward functional and medical goals.  Function:  Bathing Bathing position Bathing activity did not occur: N/A(night bath) Position: Bed  Bathing parts Body parts bathed by patient: Chest, Abdomen Body parts bathed by helper: Right arm, Right lower leg, Left arm, Left lower leg, Chest, Abdomen, Back, Front perineal area, Buttocks, Right upper leg, Left upper leg  Bathing assist Assist Level: Touching or steadying assistance(Pt > 75%)      Upper Body Dressing/Undressing Upper body dressing   What is the patient wearing?: Pull over shirt/dress     Pull over shirt/dress - Perfomed by patient: Thread/unthread right sleeve Pull over shirt/dress - Perfomed by helper: Thread/unthread left sleeve, Put head through opening, Pull shirt over trunk        Upper body assist Assist Level: Touching or steadying assistance(Pt > 75%)      Lower Body Dressing/Undressing Lower body dressing   What is the patient wearing?: Pants, Non-skid slipper socks, Ted Hose       Pants- Performed by helper: Thread/unthread right pants leg, Thread/unthread left pants leg, Pull pants up/down Non-skid slipper socks- Performed by patient: Don/doff right sock, Don/doff left sock Non-skid slipper socks- Performed by helper: Don/doff right sock, Don/doff left sock               TED Hose - Performed by helper: Don/doff right TED hose, Don/doff left TED hose  Lower body assist Assist for lower body dressing: Touching  or steadying assistance (Pt > 75%)      Toileting Toileting   Toileting steps completed by patient: Adjust clothing prior to toileting, Adjust clothing after toileting Toileting steps completed by helper: Adjust clothing prior to toileting, Performs perineal hygiene, Adjust clothing after toileting    Toileting assist Assist level: (total assist)   Transfers Chair/bed transfer   Chair/bed transfer method: Lateral  scoot Chair/bed transfer assist level: Moderate assist (Pt 50 - 74%/lift or lower) Chair/bed transfer assistive device: Armrests, Sliding board Mechanical lift: Stedy   Locomotion Ambulation Ambulation activity did not occur: Safety/medical concerns   Max distance: 15' Assist level: 2 helpers   Wheelchair   Type: Manual Max wheelchair distance: 150 Assist Level: Supervision or verbal cues  Cognition Comprehension Comprehension assist level: Understands complex 90% of the time/cues 10% of the time, Follows complex conversation/direction with extra time/assistive device  Expression Expression assist level: Expresses complex 90% of the time/cues < 10% of the time  Social Interaction Social Interaction assist level: Interacts appropriately 90% of the time - Needs monitoring or encouragement for participation or interaction.  Problem Solving Problem solving assist level: Solves complex 90% of the time/cues < 10% of the time  Memory Memory assist level: Recognizes or recalls 75 - 89% of the time/requires cueing 10 - 24% of the time   Medical Problem List and Plan: 1. 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  Discuss LOS with team 2. DVT Prophylaxis/Anticoagulation: Subcutaneous Lovenox.  3. Pain Management: Oxycodone 5 mg every 6 hours as needed -fair control 4. Mood: reduced Clonazepam 0.25 mg daily at bedtime, trazodone 50 mg daily at bedtime, Seroquel 100 mg daily at bedtime, Zoloft 50 mg daily, amantadine 100 mg daily, Provigil 100 mg daily   -improved overall 5. Neuropsych: This patient iscapable of making decisions on hisown behalf. 6. Skin/Wound Care: Routine skin checks  - continue air mattress due to sensitive skin 7. Fluids/Electrolytes/Nutrition: Routine  I&Os  -replacing k+   -Potassium level normal   8.Nondisplaced fractures of right T7 transverse process. Conservative care 9.Multiple pelvic fractures/bilateral sacroiliac joints and suprapubic symphysis as well as left pelvic sidewall hematoma. Status post sacroiliac screw fixation with external fixation pelvis anterior pelvic ring closed reduction 02/07/2017. May ambulate extended distances weightbearing as tolerated without restrictions. 10.Seizure prophylaxis. Keppra 500 mg every 12 hours. EEG negative 11.Tracheostomy tube 03/01/2017 per Dr. Hulen Skains. Decannulated. 12.GI: Gastrostomy tube 03/01/2017 per Dr. Hulen Skains. Presently not receiving tube feeds.   -remove PEG   Friday morning 13.Urinary retention/urethral injury. Status post suprapubic catheter placement 03/21/2017 per interventional radiology. Follow-up urology services Dr. Deliah Boston plan of care with urology before discharge  -[facility]  changed cath out 04/27/2017  -completed 7 day course of Bactrim for UTI 14.Acute blood loss anemia. Continue iron supplement   Hemoglobin up to 10.0      15. DM 2:   - on D3/thin.  added carb modified restriction  -amaryl 2mg  resumed with improvement   CBG (last 3)  Recent Labs    05/08/17 1648 05/08/17 2126 05/09/17 0656  GLUCAP 100* 131* 138*     16. Tachycardia   -due to deconditioning   -metoprolol increased on 11/18  -Improving control  17. Elevated blood pressure   metoprolol increased to 50 on 11/18  -better control  LOS (Days) 24 A FACE TO FACE EVALUATION WAS PERFORMED  [name], MD 05/09/2017 8:26 AM

## 2017-05-09 NOTE — Progress Notes (Signed)
Physical Therapy Weekly Progress Note  Patient Details  Name: Peter [name] MRN: [id number] Date of Birth: [birth year]  Beginning of progress report period: May 01, 2017 End of progress report period: May 09, 2017  Today's Date: 05/09/2017 PT Individual Time: 1000-1100 PT Individual Time Calculation (min): 60 min   Patient has met 1 of 4 short term goals. Pt currently requires min/modA for bed mobility, minA for transfers with transfer board with occasional modA needed depending on transfer surface, clothing worn, etc. Pt initiating gait training with litegait and eva walker however not functional for home mobility at this time. Ongoing pt and family education while pt progressing, and plan to start hands on training in the next few days to prepare for d/c home next week. Continues to be limited by significant strength, coordination and endurance deficits.  Patient continues to demonstrate the following deficits muscle weakness and muscle paralysis, decreased cardiorespiratoy endurance, impaired timing and sequencing, abnormal tone, unbalanced muscle activation and decreased coordination, decreased problem solving, decreased safety awareness, decreased memory and delayed processing and decreased sitting balance, decreased standing balance, decreased postural control and decreased balance strategies and therefore will continue to benefit from skilled PT intervention to increase functional independence with mobility.  Patient progressing slowly towards long term goals..  Plan of care revisions: Gait goal in home environment d/c; maintain gait goal in controlled environment with therapy..  PT Short Term Goals Week 3:  PT Short Term Goal 1 (Week 3): pt will perform bed mobility with minA PT Short Term Goal 1 - Progress (Week 3): Progressing toward goal PT Short Term Goal 2 (Week 3): Pt will perform slide board transfers with consistent minA PT Short Term Goal 2 - Progress (Week 3):  Progressing toward goal PT Short Term Goal 3 (Week 3): Pt will demonstrate w/c propulsion x100' with consistent S PT Short Term Goal 3 - Progress (Week 3): Met PT Short Term Goal 4 (Week 3): Pt will demonstrate sit <>stand with modA PT Short Term Goal 4 - Progress (Week 3): Progressing toward goal Week 4:  PT Short Term Goal 1 (Week 4): =LTG due to estimated LOS; minA overall from w/c level  Skilled Therapeutic Interventions/Progress Updates: Pt received seated in w/c, denies pain and agreeable to treatment. Transfer w/c <>car with slideboard and minA. Pt's sister present to observe, and videotaped transfer to be able to show to other family members. Transfer w/c <>mat table min/modA d/t skin sticking to mat as shorts slid up while transferring. Sit <>stand x3 trials from mat table with eva walker, minA overall from elevated mat table, maxA from w/c d/t lower seat height. Gait x2 trials for 5' and 10' with eva walker and maxA for LLE placement and stance control, as well as navigating walker. Returned to room totalA by sister at end of session.      Therapy Documentation Precautions:  Precautions Precautions: Fall Precaution Comments: trach, PEG, catheter  Required Braces or Orthoses: Other Brace/Splint Other Brace/Splint: bil PRAFO, abdominal binder, bil hand splints at night Restrictions Weight Bearing Restrictions: No RLE Weight Bearing: Weight bearing as tolerated LLE Weight Bearing: Weight bearing as tolerated Other Position/Activity Restrictions: no ROM restrictions bil LE  See Function Navigator for Current Functional Status.  Therapy/Group: Individual Therapy  Luberta Mutter 05/09/2017, 12:00 PM

## 2017-05-10 ENCOUNTER — Inpatient Hospital Stay (HOSPITAL_COMMUNITY): Payer: BLUE CROSS/BLUE SHIELD

## 2017-05-10 ENCOUNTER — Inpatient Hospital Stay (HOSPITAL_COMMUNITY): Payer: BLUE CROSS/BLUE SHIELD | Admitting: Physical Therapy

## 2017-05-10 LAB — GLUCOSE, CAPILLARY
GLUCOSE-CAPILLARY: 89 mg/dL (ref 65–99)
GLUCOSE-CAPILLARY: 109 mg/dL — AB (ref 65–99)
Glucose-Capillary: 133 mg/dL — ABNORMAL HIGH (ref 65–99)
Glucose-Capillary: 153 mg/dL — ABNORMAL HIGH (ref 65–99)

## 2017-05-10 NOTE — Progress Notes (Signed)
Pt resting in bed quietly. Easily aroused. C/o pain in the bilat lower exts and was educated on two topical pain management suggestions that would benefit pt's regime which includes taking tylenol prior to therapy, having biofreeze or voltaren gel added to plan of care to assist in increased pain management. Family aware at bedside. Increased fatigue is noted this shift as pt states that he "worked out very hard in therapy today." Pt also c/o having "bad reflux tonight." HOB elevated 45 degrees and protonix was administered per order and effective this shift. Safety maintained. callbell within reach. Will continue to monitor.

## 2017-05-10 NOTE — Progress Notes (Signed)
Physical Therapy Session Note  Patient Details  Name: Peter [name] MRN: [id number] Date of Birth: 1961/04/22  Today's Date: 05/10/2017 PT Individual Time: 1400-1520 PT Individual Time Calculation (min): 80 min   Short Term Goals: Week 4:  PT Short Term Goal 1 (Week 4): =LTG due to estimated LOS; minA overall from w/c level  Skilled Therapeutic Interventions/Progress Updates:   Pt supine upon arrival and agreeable to therapy, no c/o pain. Sister present throughout session. Worked on endurance while performing functional mobility and LE strengthening this session.Transferred to EOB and to w/c via lateral scoot w/ Max A. Pt declining use of slide board 2/2 wearing shorts that "don't slide". Pt self-propelled w/c to nurses station, ~75', w/ supervision using BLEs/UEs w/ frequent rest breaks 2/2 fatigue and increased work of breathing. Total A remaining distance to gym for time management. Worked on sit<>stands in parallel bars w/ Mod A for initial boost, however Min A facilitation at hips for increased hip extension, L knee block to remain standing for 2-3 seconds. Verbal cues for posture in stance and technique for transfer. Performed sit<>stands x5, Min-Mod A overall. Total A w/c propulsion to day room and performed NuStep 5 min x2 @ L1 for LE strengthening and to facilitate increased ROM of bilateral knees, pt states they feel "stiff". Pt self-propelled w/c 75' back to room w/ BUEs and increased speed compared to beginning of session. Transferred to recliner via lateral scoot and ended session in recliner w/ feet propped and no ted hose, per RN approval, for comfort. All needs met and in care of family.   Therapy Documentation Precautions:  Precautions Precautions: Fall Precaution Comments: trach, PEG, catheter  Required Braces or Orthoses: Other Brace/Splint Other Brace/Splint: bil PRAFO, abdominal binder, bil hand splints at night Restrictions Weight Bearing Restrictions: No RLE Weight  Bearing: Weight bearing as tolerated LLE Weight Bearing: Weight bearing as tolerated Other Position/Activity Restrictions: no ROM restrictions bil LE  See Function Navigator for Current Functional Status.   Therapy/Group: Individual Therapy  [name] 05/10/2017, 3:33 PM

## 2017-05-10 NOTE — Progress Notes (Signed)
Social Work Patient ID: Peter [name], male   DOB: [birth year], 56 y.o.   MRN: [id number]   Met with pt and sister today to review team conference.  Both aware and agreeable with recommendation to extend stay and new d/c date of 11/30.  We discussed w/c, slide board goals and recommendation of hospital bed. Pt is agreeable.  Sister will pass information on to wife.  Discussed need for hands on education to begin next week.  Will continue to follow.  Ciro Tashiro, LCSW

## 2017-05-10 NOTE — Patient Care Conference (Signed)
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 Elevated blood pressure affecting pregnancy in first trimester, antepartum   . PEG (percutaneous endoscopic gastrostomy) status ([place])   . Suprapubic catheter ([place])   . Tachycardia   . Closed fracture of seventh thoracic vertebra ([place])   . 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)   . Chest trauma   . Fracture   . Multiple rib fractures involving four or more ribs   . Pelvic ring fracture, sequela   . Respiratory failure ([place])   . 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[place])   . Post-operative pain   . Acute blood loss anemia   . Diabetes mellitus type 2 in nonobese ([facility])   . Hyperlipidemia   . Tachypnea   . Secondary hypertension   . Hyperkalemia   . Pressure injury of skin 02/28/2017  . Multiple closed anterior-posterior compression fractures of pelvis ([place]) 02/08/2017  . Multiple fractures of ribs, bilateral, initial encounter for closed fracture 02/07/2017    Expected Discharge Date: Expected Discharge Date: 05/19/17  Team Members Present: Physician leading conference: Dr. Alger Simons Social Worker Present: Lennart Pall, LCSW Nurse Present: Elliot Cousin,  RN PT Present: Kem Parkinson, PT OT Present: Roanna Epley, [place], OT PPS Coordinator present : Daiva Nakayama, RN, CRRN     Current Status/Progress Goal Weekly Team Focus  Medical   Patient making slow progress.  Muscle weakness likely due to critical illness myopathy/neuropathy.  Belly now soft and making regular bowel movements.  Plan to remove PEG this week  Maximize nutrition and activity tolerance  PEG to come out this week now that stomach is decompressed, numerous discussions regarding patient's ongoing weakness with him and family   Bowel/Bladder   cotninent with incontinent eipsodes noted. suprapubic cath         Swallow/Nutrition/ Hydration   dysphagia 3 diet thin liquids  without any weight loss      ADL's   UB bathing/dressing-min A; LB bathing-mod A; LB dressing-max A; transfers-Stedy (min A for sit<>stand)  min A overall  educaton, functional transfers, BUE ROM and strength, ADL retraining, activity tolerance   Mobility   modA bed mobility and transfers, maxA sit <>stand and gait with lift equipment  min assist w/c level; supervision w/c mobility; home gait goal d/c; mod assist controlled environment gait goal  activity tolerance, LE/core strengthening, transfer and gait training   Communication   alert & oriented  maintain      Safety/Cognition/ Behavioral Observations  minimal use of bilat hands / feet  improve usage      Pain   denies any pain at this time  maintain      Skin   abrasions to right forearm anf right thigh  continue wound healing without signs / symptoms of infection         *See Care Plan and progress notes for long and short-term goals.     Barriers to Discharge  Current Status/Progress Possible Resolutions Date Resolved   Physician    Medical stability;Wound Care        Continue therapy to maximize mobility.  Will need to have wheelchair level goals as gait is not going to be a reasonable goal while on inpatient rehab even if we  can lengthen stat      Nursing                  PT                    OT                  SLP                SW                Discharge Planning/Teaching Needs:  Plan for d/c home with wife and family to provide 24/7 assistance.  Teaching has been ongoing with wife   Team Discussion:  Ileus improved;  Plan to remove peg on Fri;  Do NOT expect pt to be ambulatory at d/c.  Transfers still min - mod/max.  Anticipate slide board transfers and recommend a hospital bed.  Would benefit from extending stay through next week with change of targeted dc date to 11/30.  Revisions to Treatment Plan:  Downgrading some goals but need to extend stay.    Continued Need for Acute Rehabilitation Level of Care: The patient requires daily medical management by a physician with specialized training in physical medicine and rehabilitation for the following conditions: Daily direction of a multidisciplinary physical rehabilitation program to ensure safe treatment while eliciting the highest outcome that is of practical value to the patient.: Yes Daily medical management of patient stability for increased activity during participation in an intensive rehabilitation regime.: Yes Daily analysis of laboratory values and/or radiology reports with any subsequent need for medication adjustment of medical intervention for : Post surgical problems;Neurological problems;Wound care problems;Nutritional problems  Peter [name] 05/10/2017, 3:54 PM

## 2017-05-10 NOTE — Progress Notes (Signed)
[place] PHYSICAL MEDICINE & REHABILITATION     PROGRESS NOTE    Subjective/Complaints: Just waking up. No new complaints. Happy that he stood some yesterday in therapy  ROS: pt denies nausea, vomiting, diarrhea, cough, shortness of breath or chest pain    Objective: Vital Signs: Blood pressure 133/70, pulse 90, temperature (!) 97.2 F (36.2 C), temperature source Oral, resp. rate 18, height 5\' 10"  (1.778 m), weight 83.9 kg (185 lb), SpO2 98 %. No results found. Recent Labs    05/08/17 0533  WBC 6.4  HGB 10.0*  HCT 31.9*  PLT 459*   Recent Labs    05/08/17 0533  NA 139  K 4.1  CL 108  GLUCOSE 100*  BUN 17  CREATININE 0.63  CALCIUM 8.6*   CBG (last 3)  Recent Labs    05/09/17 1644 05/09/17 2156 05/10/17 0638  GLUCAP 110* 124* 133*    Wt Readings from Last 3 Encounters:  05/10/17 83.9 kg (185 lb)  03/23/17 74.4 kg (164 lb 0.4 oz)    Physical Exam:  Constitutional: NAD. Vital signs reviewed. HENT: Normocephalicand atraumatic.  Eyes: EOMare normal. No discharge.  Cardiovascular: RRR without murmur. No JVD    Respiratory: CTA Bilaterally without wheezes or rales. Normal effort  GI: BS +, non-tender, non-distended  Skin. Warm and dry, intact Musculoskeletal. No edema. No tenderness.  Genitourinary.  + Suprapubic cath in place and draining, urine clear Neurological. Alert and oriented 3.  Follows simple commands.   B/l UE Bilateral 3+/5 Deltoid, biceps, triceps and 3-4/5 HI.  B/l LE: 2/5 HF, KE,3-/5  ADF,APF--  Generalized muscle wasting throughout Psych: affect remains flat  Assessment/Plan: 1. 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 Physiatrist is providing close team supervision and 24 hour management of active medical problems listed below. Physiatrist and rehab team continue to assess  barriers to discharge/monitor patient progress toward functional and medical goals.  Function:  Bathing Bathing position Bathing activity did not occur: N/A(night bath) Position: Bed  Bathing parts Body parts bathed by patient: Chest, Abdomen Body parts bathed by helper: Right arm, Right lower leg, Left arm, Left lower leg, Chest, Abdomen, Back, Front perineal area, Buttocks, Right upper leg, Left upper leg  Bathing assist Assist Level: Touching or steadying assistance(Pt > 75%)      Upper Body Dressing/Undressing Upper body dressing   What is the patient wearing?: Pull over shirt/dress     Pull over shirt/dress - Perfomed by patient: Thread/unthread right sleeve Pull over shirt/dress - Perfomed by helper: Thread/unthread left sleeve, Put head through opening, Pull shirt over trunk        Upper body assist Assist Level: Touching or steadying assistance(Pt > 75%)      Lower Body Dressing/Undressing Lower body dressing   What is the patient wearing?: Pants, Non-skid slipper socks, Ted Hose       Pants- Performed by helper: Thread/unthread right pants leg, Thread/unthread left pants leg, Pull pants up/down Non-skid slipper socks- Performed by patient: Don/doff right sock, Don/doff left sock Non-skid slipper socks- Performed by helper: Don/doff right sock, Don/doff left sock               TED Hose - Performed by helper: Don/doff right TED hose, Don/doff left TED hose  Lower body assist Assist for lower body dressing: Touching or steadying assistance (Pt > 75%)      Toileting Toileting  Toileting steps completed by patient: Adjust clothing prior to toileting, Adjust clothing after toileting Toileting steps completed by helper: Adjust clothing prior to toileting, Performs perineal hygiene, Adjust clothing after toileting    Toileting assist Assist level: Two helpers   Transfers Chair/bed transfer   Chair/bed transfer Bruno: Lateral scoot Chair/bed transfer assist  level: Moderate assist (Pt 50 - 74%/lift or lower) Chair/bed transfer assistive device: Armrests, Sliding board Mechanical lift: Stedy   Locomotion Ambulation Ambulation activity did not occur: Safety/medical concerns   Max distance: 12 Assist level: Maximal assist (Pt 25 - 49%)   Wheelchair   Type: Manual Max wheelchair distance: 100 Assist Level: Supervision or verbal cues  Cognition Comprehension Comprehension assist level: Follows complex conversation/direction with extra time/assistive device  Expression Expression assist level: Expresses complex ideas: With extra time/assistive device  Social Interaction Social Interaction assist level: Interacts appropriately with others - No medications needed.  Problem Solving Problem solving assist level: Solves complex 90% of the time/cues < 10% of the time  Memory Memory assist level: Recognizes or recalls 75 - 89% of the time/requires cueing 10 - 24% of the time   Medical Problem List and Plan: 1. 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 DVT Prophylaxis/Anticoagulation: Subcutaneous Lovenox.  3. Pain Management: Oxycodone 5 mg every 6 hours as needed -fair control 4. Mood: reduced Clonazepam 0.25 mg daily at bedtime, trazodone 50 mg daily at bedtime, Seroquel 100 mg daily at bedtime, Zoloft 50 mg daily, amantadine 100 mg daily, Provigil 100 mg daily   -improved overall  -dc klonopin today 5. Neuropsych: This patient iscapable of making decisions on hisown behalf. 6. Skin/Wound Care: Routine skin checks  - continue air mattress due to sensitive skin 7. Fluids/Electrolytes/Nutrition: Routine I&Os  -replacing k+   -Potassium level normal   8.Nondisplaced fractures of right T7 transverse process. Conservative  care 9.Multiple pelvic fractures/bilateral sacroiliac joints and suprapubic symphysis as well as left pelvic sidewall hematoma. Status post sacroiliac screw fixation with external fixation pelvis anterior pelvic ring closed reduction 02/07/2017. May ambulate extended distances weightbearing as tolerated without restrictions. 10.Seizure prophylaxis. Keppra 500 mg every 12 hours. EEG negative 11.Tracheostomy tube 03/01/2017 per Dr. Hulen Skains. Decannulated. 12.GI: Gastrostomy tube 03/01/2017 per Dr. Hulen Skains. Presently not receiving tube feeds.   -remove PEG   Friday morning, pt aware 13.Urinary retention/urethral injury. Status post suprapubic catheter placement 03/21/2017 per interventional radiology. Follow-up urology services Dr. Deliah Boston plan of care with urology before discharge (next week)  -SPC  changed cath out 04/27/2017  -completed 7 day course of Bactrim for UTI 14.Acute blood loss anemia. Continue iron supplement   Hemoglobin up to 10.0      15. DM 2:   - on D3/thin.  added carb modified restriction  -amaryl 2mg  resumed with improvement   CBG (last 3)  Recent Labs    05/09/17 1644 05/09/17 2156 05/10/17 0638  GLUCAP 110* 124* 133*    -overall improved 16. Tachycardia   -due to deconditioning   -metoprolol increased on 11/18  -Improving control  17. Elevated blood pressure   metoprolol increased to 50 on 11/18  -better control  LOS (Days) 25 A FACE TO FACE EVALUATION WAS PERFORMED  [name], MD 05/10/2017 8:20 AM

## 2017-05-10 NOTE — Progress Notes (Signed)
Occupational Therapy Session Note  Patient Details  Name: Peter [name] MRN: [id number] Date of Birth: 1961-02-09  Today's Date: 05/10/2017 OT Individual Time: 3235-5732 OT Individual Time Calculation (min): 55 min    Short Term Goals: Week 3:  OT Short Term Goal 1 (Week 3): Pt will perform squat pivot transfer to [facility] with max A OT Short Term Goal 2 (Week 3): Pt will perform LB dressing tasks with max A sit<>stand. OT Short Term Goal 3 (Week 3): Pt will perform UB dressing tasks seated EOB with min A  Skilled Therapeutic Interventions/Progress Updates:    Pt resting in bed upon arrival with sister present.  Pt self feeding breakfast after setup.  Pt required min A moving LLE off edge of bed and min a for sidelying>sit EOB in preparation for Stedy transfer to w/c.  Pt was able to scoot forward in bed in preparation for use of Stedy.  Pt required min a with sit<>stand in [place] with bed elevated.  Pt completed grooming tasks (washing face, brushing teeth, and shaving with electric razor) seated in w/c at sink.  Pt alternated using RUE and LUE during tasks.  Pt attempted to grasp razor with R hand but grasp not strong enough to hold razor while shaving.  Pt also propelled w/c and navigated in room environment. Pt remained in w/c with all needs within reach and sister present.    Therapy Documentation Precautions:  Precautions Precautions: Fall Precaution Comments: trach, PEG, catheter  Required Braces or Orthoses: Other Brace/Splint Other Brace/Splint: bil PRAFO, abdominal binder, bil hand splints at night Restrictions Weight Bearing Restrictions: No RLE Weight Bearing: Weight bearing as tolerated LLE Weight Bearing: Weight bearing as tolerated Other Position/Activity Restrictions: no ROM restrictions bil LE  Pain:    See Function Navigator for Current Functional Status.   Therapy/Group: Individual Therapy  [name] 05/10/2017, 9:47 AM

## 2017-05-10 NOTE — Progress Notes (Signed)
Physical Therapy Session Note  Patient Details  Name: Peter [name] MRN: [id number] Date of Birth: 01/06/1961  Today's Date: 05/10/2017 PT Individual Time: 1015-1100 PT Individual Time Calculation (min): 45 min   Short Term Goals: Week 4:  PT Short Term Goal 1 (Week 4): =LTG due to estimated LOS; minA overall from w/c level  Skilled Therapeutic Interventions/Progress Updates: Pt received supine in bed, denies pain and agreeable to treatment. Supine>sit with modA and bedrails, HOB flat. Sit >stand in stedy modA to transfer to w/c. W/c propulsion x75' with BUE for strengthening and endurance. Transfer w/c>mat table with slideboard and minA, improving push through BLEs to improve hip clearance, cues for increasing lift up and over, as opposed to scooting. Gait with eva walker and modA for LLE stance control; significantly improving LE placement and coordination, management of eva walker for progression forward. First trial x5' before fatigued, second trial x10'. Provided pt and sister with home measurement sheet to send home with wife to being assessing setup and identify any areas of concern. Pt remained seated in w/c at end of session, all needs in reach.      Therapy Documentation Precautions:  Precautions Precautions: Fall Precaution Comments: trach, PEG, catheter  Required Braces or Orthoses: Other Brace/Splint Other Brace/Splint: bil PRAFO, abdominal binder, bil hand splints at night Restrictions Weight Bearing Restrictions: No RLE Weight Bearing: Weight bearing as tolerated LLE Weight Bearing: Weight bearing as tolerated Other Position/Activity Restrictions: no ROM restrictions bil LE Pain: Pain Assessment Pain Assessment: No/denies pain   See Function Navigator for Current Functional Status.   Therapy/Group: Individual Therapy  Luberta Mutter 05/10/2017, 11:37 AM

## 2017-05-11 LAB — GLUCOSE, CAPILLARY
GLUCOSE-CAPILLARY: 111 mg/dL — AB (ref 65–99)
GLUCOSE-CAPILLARY: 151 mg/dL — AB (ref 65–99)
GLUCOSE-CAPILLARY: 96 mg/dL (ref 65–99)
Glucose-Capillary: 112 mg/dL — ABNORMAL HIGH (ref 65–99)

## 2017-05-11 NOTE — Progress Notes (Signed)
[name] PHYSICAL MEDICINE & REHABILITATION     PROGRESS NOTE    Subjective/Complaints: Lying in bed getting cleaned up by nurse tech.  Had a fairly uneventful night.  Is tired by therapy yesterday.  Did have some reflux overnight  ROS: pt denies nausea, vomiting, diarrhea, cough, shortness of breath or chest pain   Objective: Vital Signs: Blood pressure (!) 142/68, pulse 95, temperature 98 F (36.7 C), temperature source Oral, resp. rate 18, height 5\' 10"  (1.778 m), weight 83.9 kg (185 lb), SpO2 98 %. No results found. No results for input(s): WBC, HGB, HCT, PLT in the last 72 hours. No results for input(s): NA, K, CL, GLUCOSE, BUN, CREATININE, CALCIUM in the last 72 hours.  Invalid input(s): CO CBG (last 3)  Recent Labs    05/10/17 1646 05/10/17 2125 05/11/17 0629  GLUCAP 89 109* 111*    Wt Readings from Last 3 Encounters:  05/10/17 83.9 kg (185 lb)  03/23/17 74.4 kg (164 lb 0.4 oz)    Physical Exam:  Constitutional: NAD. Vital signs reviewed. HENT: Normocephalicand atraumatic.  Eyes: EOMare normal. No discharge.  Cardiovascular: Regular rate    Respiratory: Clear with normal effort GI: BS +, non-tender, non-distended  Skin. Warm and dry, intact Musculoskeletal. No edema. No tenderness.  Genitourinary.  + Suprapubic cath in place and draining, urine clear Neurological. Alert and oriented 3.  Follows simple commands.   B/l UE Bilateral 3+/5 Deltoid, biceps, triceps and 3-4/5 HI.  B/l LE: 2/5 HF, KE,3-/5  ADF,APF--  Generalized muscle wasting throughout Psych: affect remains flat  Assessment/Plan: 1. 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 Physiatrist is providing close team supervision and 24 hour management of active medical problems listed below. Physiatrist and rehab team continue to assess  barriers to discharge/monitor patient progress toward functional and medical goals.  Function:  Bathing Bathing position Bathing activity did not occur: N/A(night bath) Position: Bed  Bathing parts Body parts bathed by patient: Chest, Abdomen Body parts bathed by helper: Right arm, Right lower leg, Left arm, Left lower leg, Chest, Abdomen, Back, Front perineal area, Buttocks, Right upper leg, Left upper leg  Bathing assist Assist Level: Touching or steadying assistance(Pt > 75%)      Upper Body Dressing/Undressing Upper body dressing   What is the patient wearing?: Pull over shirt/dress     Pull over shirt/dress - Perfomed by patient: Thread/unthread right sleeve Pull over shirt/dress - Perfomed by helper: Thread/unthread left sleeve, Put head through opening, Pull shirt over trunk        Upper body assist Assist Level: Touching or steadying assistance(Pt > 75%)      Lower Body Dressing/Undressing Lower body dressing   What is the patient wearing?: Pants, Non-skid slipper socks, Ted Hose       Pants- Performed by helper: Thread/unthread right pants leg, Thread/unthread left pants leg, Pull pants up/down Non-skid slipper socks- Performed by patient: Don/doff right sock, Don/doff left sock Non-skid slipper socks- Performed by helper: Don/doff right sock, Don/doff left sock               TED Hose - Performed by helper: Don/doff right TED hose, Don/doff left TED hose  Lower body assist Assist for lower body dressing: Touching or steadying assistance (Pt > 75%)      Toileting Toileting   Toileting steps completed by patient: Adjust clothing prior to toileting, Adjust clothing after  toileting Toileting steps completed by helper: Adjust clothing prior to toileting, Performs perineal hygiene, Adjust clothing after toileting    Toileting assist Assist level: Touching or steadying assistance (Pt.75%), Set up/obtain supplies   Transfers Chair/bed transfer   Chair/bed  transfer method: Lateral scoot Chair/bed transfer assist level: Maximal assist (Pt 25 - 49%/lift and lower) Chair/bed transfer assistive device: Armrests Mechanical lift: Stedy   Locomotion Ambulation Ambulation activity did not occur: Safety/medical concerns   Max distance: 10 Assist level: Moderate assist (Pt 50 - 74%)(w/c follow)   Wheelchair   Type: Manual Max wheelchair distance: 72' Assist Level: Supervision or verbal cues  Cognition Comprehension Comprehension assist level: Follows complex conversation/direction with extra time/assistive device  Expression Expression assist level: Expresses complex ideas: With extra time/assistive device  Social Interaction Social Interaction assist level: Interacts appropriately with others - No medications needed.  Problem Solving Problem solving assist level: Solves complex problems: With extra time  Memory Memory assist level: More than reasonable amount of time   Medical Problem List and Plan: 1. 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   -Estimated length of stay 11/30 2. DVT Prophylaxis/Anticoagulation: Subcutaneous Lovenox.  3. Pain Management: Oxycodone 5 mg every 6 hours as needed -fair control 4. Mood: reduced Clonazepam 0.25 mg daily at bedtime, trazodone 50 mg daily at bedtime, Seroquel 100 mg daily at bedtime, Zoloft 50 mg daily, amantadine 100 mg daily, Provigil 100 mg daily   -improved overall  -dc klonopin today 5. Neuropsych: This patient iscapable of making decisions on hisown behalf. 6. Skin/Wound Care: Routine skin checks  - continue air mattress due to sensitive skin 7. Fluids/Electrolytes/Nutrition: Routine I&Os  -replacing k+    l   8.Nondisplaced fractures of right T7 transverse process.  Conservative care 9.Multiple pelvic fractures/bilateral sacroiliac joints and suprapubic symphysis as well as left pelvic sidewall hematoma. Status post sacroiliac screw fixation with external fixation pelvis anterior pelvic ring closed reduction 02/07/2017. May ambulate extended distances weightbearing as tolerated without restrictions. 10.Seizure prophylaxis. Keppra 500 mg every 12 hours. EEG negative 11.Tracheostomy tube 03/01/2017 per Dr. Hulen Skains. Decannulated. 12.GI: Gastrostomy tube 03/01/2017 per Dr. Hulen Skains. Presently not receiving tube feeds.   -N.p.o. after midnight for PEG out tomorrow  13.Urinary retention/urethral injury. Status post suprapubic catheter placement 03/21/2017 per interventional radiology. Follow-up urology services Dr. Deliah Boston plan of care with urology before discharge (next week)  -SPC  changed cath out 04/27/2017  -completed 7 day course of Bactrim for UTI 14.Acute blood loss anemia. Continue iron supplement   Hemoglobin up to 10.0      15. DM 2:   - on D3/thin.  added carb modified restriction  -amaryl 2mg  resumed with improvement   CBG (last 3)  Recent Labs    05/10/17 1646 05/10/17 2125 05/11/17 0629  GLUCAP 89 109* 111*    -overall improved 16. Tachycardia   -due to deconditioning   -metoprolol increased on 11/18  -Improving control  17. Elevated blood pressure   metoprolol increased to 50 on 11/18  -better control  LOS (Days) 26 A FACE TO FACE EVALUATION WAS PERFORMED  [name], MD 05/11/2017 8:35 AM

## 2017-05-12 ENCOUNTER — Inpatient Hospital Stay (HOSPITAL_COMMUNITY): Payer: BLUE CROSS/BLUE SHIELD | Admitting: Physical Therapy

## 2017-05-12 ENCOUNTER — Inpatient Hospital Stay (HOSPITAL_COMMUNITY): Payer: BLUE CROSS/BLUE SHIELD | Admitting: Occupational Therapy

## 2017-05-12 ENCOUNTER — Inpatient Hospital Stay (HOSPITAL_COMMUNITY): Payer: BLUE CROSS/BLUE SHIELD

## 2017-05-12 LAB — GLUCOSE, CAPILLARY
Glucose-Capillary: 108 mg/dL — ABNORMAL HIGH (ref 65–99)
Glucose-Capillary: 122 mg/dL — ABNORMAL HIGH (ref 65–99)
Glucose-Capillary: 126 mg/dL — ABNORMAL HIGH (ref 65–99)
Glucose-Capillary: 116 mg/dL — ABNORMAL HIGH (ref 65–99)

## 2017-05-12 NOTE — Progress Notes (Signed)
Occupational Therapy Weekly Progress Note  Patient Details  Name: Peter [name] MRN: [id number] Date of Birth: 1961/02/05  Beginning of progress report period: May 04, 2017 End of progress report period: May 12, 2017  Patient has met 3 of 3 short term goals.  Pt has made steady progress with BADLs during the past week.  Pt completes grooming, UB bathing/dressing tasks with min A and LB bathing/dressing tasks with max A.  Pt performs squat pivot transfers with mod A/max A.  Pt's family has been present during all therapy sessions.  Pt initiates use of BUE in all functional tasks and continues to exhibit improved strength and functional use (L>R).   Patient continues to demonstrate the following deficits: muscle weakness and muscle joint tightness, decreased cardiorespiratoy endurance and decreased sitting balance, decreased standing balance, decreased postural control and decreased balance strategies and therefore will continue to benefit from skilled OT intervention to enhance overall performance with BADL.  Patient not progressing toward long term goals.  See goal revision..  Plan of care revisions: LTGs of standing balance, bathing, and toilet transfers downgraded from min to mod A.  . LB dressing and toileting goals are now max A. The tub transfer goal has been discharged as pt can not shower with his JP drain.  OT Short Term Goals Week 3:  OT Short Term Goal 1 (Week 3): Pt will perform squat pivot transfer to [facility] with max A OT Short Term Goal 1 - Progress (Week 3): Met OT Short Term Goal 2 (Week 3): Pt will perform LB dressing tasks with max A sit<>stand. OT Short Term Goal 2 - Progress (Week 3): Met OT Short Term Goal 3 (Week 3): Pt will perform UB dressing tasks seated EOB with min A OT Short Term Goal 3 - Progress (Week 3): Met Week 4:  OT Short Term Goal 1 (Week 4): STG=LTG secondary to ELOS      Therapy Documentation Precautions:  Precautions Precautions:  Fall Precaution Comments: trach, PEG, catheter  Required Braces or Orthoses: Other Brace/Splint Other Brace/Splint: bil PRAFO, abdominal binder, bil hand splints at night Restrictions Weight Bearing Restrictions: No RLE Weight Bearing: Weight bearing as tolerated LLE Weight Bearing: Weight bearing as tolerated Other Position/Activity Restrictions: no ROM restrictions bil LE Pain:  Pt denies pain  See Function Navigator for Current Functional Status.    Peter [facility] 05/12/2017, 6:45 AM

## 2017-05-12 NOTE — Progress Notes (Signed)
[facility]ILITATION     PROGRESS NOTE    Subjective/Complaints: No new issues. Ready to have PEG out. A little apprehensive.   ROS: pt denies nausea, vomiting, diarrhea, cough, shortness of breath or chest pain    Objective: Vital Signs: Blood pressure (!) 147/79, pulse 92, temperature 98.2 F (36.8 C), temperature source Oral, resp. rate 17, height 5\' 10"  (1.778 m), weight 83.9 kg (185 lb), SpO2 96 %. No results found. No results for input(s): WBC, HGB, HCT, PLT in the last 72 hours. No results for input(s): NA, K, CL, GLUCOSE, BUN, CREATININE, CALCIUM in the last 72 hours.  Invalid input(s): CO CBG (last 3)  Recent Labs    05/11/17 1655 05/11/17 2108 05/12/17 0627  GLUCAP 96 112* 108*    Wt Readings from Last 3 Encounters:  05/10/17 83.9 kg (185 lb)  03/23/17 74.4 kg (164 lb 0.4 oz)    Physical Exam:  Constitutional: NAD. Vital signs reviewed. HENT: Normocephalicand atraumatic.  Eyes: EOMare normal. No discharge.  Cardiovascular: RRR without murmur. No JVD    Respiratory: CTA Bilaterally without wheezes or rales. Normal effort  GI: BS +, non-tender, non-distended, PEG in place  Skin. Warm and dry, intact Musculoskeletal. No edema. No tenderness.  Genitourinary.  + Suprapubic cath in place and draining, urine clear Neurological. Alert and oriented 3.  Follows simple commands.   B/l UE Bilateral 3+/5 Deltoid, biceps, triceps and 3-4/5 HI.  B/l LE: 2/5 HF, KE,3-/5  ADF,APF--  Generalized muscle wasting throughout Psych: affect remains flat  Assessment/Plan: 1. 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 Physiatrist is providing close team supervision and 24 hour management of active medical problems listed below. Physiatrist and rehab team continue to assess barriers to  discharge/monitor patient progress toward functional and medical goals.  Function:  Bathing Bathing position Bathing activity did not occur: N/A(night bath) Position: Bed  Bathing parts Body parts bathed by patient: Chest, Abdomen Body parts bathed by helper: Right arm, Right lower leg, Left arm, Left lower leg, Chest, Abdomen, Back, Front perineal area, Buttocks, Right upper leg, Left upper leg  Bathing assist Assist Level: Touching or steadying assistance(Pt > 75%)      Upper Body Dressing/Undressing Upper body dressing   What is the patient wearing?: Pull over shirt/dress     Pull over shirt/dress - Perfomed by patient: Thread/unthread right sleeve Pull over shirt/dress - Perfomed by helper: Thread/unthread left sleeve, Put head through opening, Pull shirt over trunk        Upper body assist Assist Level: Touching or steadying assistance(Pt > 75%)      Lower Body Dressing/Undressing Lower body dressing   What is the patient wearing?: Pants, Non-skid slipper socks, Ted Hose       Pants- Performed by helper: Thread/unthread right pants leg, Thread/unthread left pants leg, Pull pants up/down Non-skid slipper socks- Performed by patient: Don/doff right sock, Don/doff left sock Non-skid slipper socks- Performed by helper: Don/doff right sock, Don/doff left sock               TED Hose - Performed by helper: Don/doff right TED hose, Don/doff left TED hose  Lower body assist Assist for lower body dressing: Touching or steadying assistance (Pt > 75%)      Toileting Toileting   Toileting steps completed by patient: Adjust clothing prior to toileting, Adjust clothing after toileting Toileting steps  completed by helper: Adjust clothing prior to toileting, Performs perineal hygiene, Adjust clothing after toileting    Toileting assist Assist level: Touching or steadying assistance (Pt.75%), Set up/obtain supplies   Transfers Chair/bed transfer   Chair/bed transfer method:  Lateral scoot Chair/bed transfer assist level: Maximal assist (Pt 25 - 49%/lift and lower) Chair/bed transfer assistive device: Armrests Mechanical lift: Stedy   Locomotion Ambulation Ambulation activity did not occur: Safety/medical concerns   Max distance: 10 Assist level: Moderate assist (Pt 50 - 74%)(w/c follow)   Wheelchair   Type: Manual Max wheelchair distance: 9' Assist Level: Supervision or verbal cues  Cognition Comprehension Comprehension assist level: Follows complex conversation/direction with extra time/assistive device  Expression Expression assist level: Expresses complex ideas: With extra time/assistive device  Social Interaction Social Interaction assist level: Interacts appropriately with others - No medications needed.  Problem Solving Problem solving assist level: Solves complex problems: With extra time  Memory Memory assist level: More than reasonable amount of time   Medical Problem List and Plan: 1. 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   -Estimated length of stay 11/30 2. DVT Prophylaxis/Anticoagulation: Subcutaneous Lovenox.  3. Pain Management: Oxycodone 5 mg every 6 hours as needed -fair control 4. Mood:   trazodone 50 mg daily at bedtime, Seroquel 100 mg daily at bedtime, Zoloft 50 mg daily, amantadine 100 mg daily, Provigil 100 mg daily   -improved overall  -klonopin stopped   -begin to taper seroquel this weekend 5. Neuropsych: This patient iscapable of making decisions on hisown behalf. 6. Skin/Wound Care: Routine skin checks  - continue air mattress due to sensitive skin 7. Fluids/Electrolytes/Nutrition: Routine I&Os  -replacing k+    l   8.Nondisplaced fractures of right T7 transverse process. Conservative care 9.Multiple  pelvic fractures/bilateral sacroiliac joints and suprapubic symphysis as well as left pelvic sidewall hematoma. Status post sacroiliac screw fixation with external fixation pelvis anterior pelvic ring closed reduction 02/07/2017. May ambulate extended distances weightbearing as tolerated without restrictions. 10.Seizure prophylaxis. Keppra 500 mg every 12 hours. EEG negative 11.Tracheostomy tube 03/01/2017 per Dr. Hulen Skains. Decannulated. 12.GI: Gastrostomy tube 03/01/2017 per Dr. Hulen Skains. Presently not receiving tube feeds.   -Removed PEG today easily with traction.  Compressive dressing placed.  Resume diet at lunchtime today 13.Urinary retention/urethral injury. Status post suprapubic catheter placement 03/21/2017 per interventional radiology. Follow-up urology services Dr. Deliah Boston plan of care with urology before discharge (next week)  -SPC  changed cath out 04/27/2017  -completed 7 day course of Bactrim for UTI 14.Acute blood loss anemia. Continue iron supplement   Hemoglobin up to 10.0      15. DM 2:   - on D3/thin.  added carb modified restriction  -amaryl 2mg  resumed with improvement   CBG (last 3)  Recent Labs    05/11/17 1655 05/11/17 2108 05/12/17 0627  GLUCAP 96 112* 108*    -Good control at present 16. Tachycardia   -due to deconditioning   -metoprolol increased on 11/18  -Improving control  17. Elevated blood pressure   metoprolol increased to 50 on 11/18  -better control  LOS (Days) 27 A FACE TO FACE EVALUATION WAS PERFORMED  Alger Simons T, MD 05/12/2017 10:01 AM

## 2017-05-12 NOTE — Progress Notes (Signed)
Physical Therapy Session Note  Patient Details  Name: Peter [name] MRN: [id number] Date of Birth: Feb 11, 1961  Today's Date: 05/12/2017 PT Individual Time: 1130-1200 PT Individual Time Calculation (min): 30 min   Short Term Goals: Week 4:  PT Short Term Goal 1 (Week 4): =LTG due to estimated LOS; minA overall from w/c level  Skilled Therapeutic Interventions/Progress Updates: Pt received supine in bed; c/o nausea after taking pain meds on an empty stomach as he has been NPO for PEG removal. Pt declined participation at this time. Therapist returned later and able to make up 30 min of missed 90 min session. Performed PROM to BLE hip flexors, quads, gastroc, soleus, hip flexion, hip internal/external rotation, adductors. Demonstrated supine hip flexor/quad stretch to pt and wife as quad length most limiting with knee to chest for glute stretch. Tremors/spasms noted throughout LEs. Bridging to scoot toward [facility] with minA to stabilize feet. Remained supine in bed at end of session, all needs in reach.      Therapy Documentation Precautions:  Precautions Precautions: Fall Precaution Comments: trach, PEG, catheter  Required Braces or Orthoses: Other Brace/Splint Other Brace/Splint: bil PRAFO, abdominal binder, bil hand splints at night Restrictions Weight Bearing Restrictions: Yes RLE Weight Bearing: Weight bearing as tolerated LLE Weight Bearing: Weight bearing as tolerated Other Position/Activity Restrictions: no ROM restrictions bil LE General: PT Amount of Missed Time (min): 60 Minutes PT Missed Treatment Reason: Patient ill (Comment)   See Function Navigator for Current Functional Status.   Therapy/Group: Individual Therapy  Luberta Mutter 05/12/2017, 12:58 PM

## 2017-05-12 NOTE — Progress Notes (Signed)
Pt resting in bed quietly. Easily aroused. Denies any pain at this time. Bath completed prior to bed time and CHG bath to be provided in early morning. Incontinent b/b with suprapubic cath that is patent. Skin with 2 healing abrasions, right thigh and right forearm. callbell within reach. Safety maintained. Will continue to monitor.

## 2017-05-12 NOTE — Progress Notes (Signed)
Occupational Therapy Session Note  Patient Details  Name: Peter [name] MRN: [id number] Date of Birth: [birth year]  Today's Date: 05/12/2017 OT Individual Time: 3149-7026 OT Individual Time Calculation (min): 30 min  Pt missed 30 mins skilled OT services secondary to bedside procedure (PEG tube removal)   Short Term Goals: Week 4:  OT Short Term Goal 1 (Week 4): STG=LTG secondary to ELOS  Skilled Therapeutic Interventions/Progress Updates:    Focus on bed mobility and BUE therex, PROM and AROM.  Pt stated he is nauseous because he hasn't eaten since previous night and just took medications.  Pt noted with improved BUE PROM/AROM and overall grasp strength.  Pt remained in bed with all needs within reach and wife present.   Therapy Documentation Precautions:  Precautions Precautions: Fall Precaution Comments: trach, PEG, catheter  Required Braces or Orthoses: Other Brace/Splint Other Brace/Splint: bil PRAFO, abdominal binder, bil hand splints at night Restrictions Weight Bearing Restrictions: No RLE Weight Bearing: Weight bearing as tolerated LLE Weight Bearing: Weight bearing as tolerated Other Position/Activity Restrictions: no ROM restrictions bil LE General: General OT Amount of Missed Time: 30 Minutes Pain: Pain Assessment Pain Assessment: No/denies pain    See Function Navigator for Current Functional Status.   Therapy/Group: Individual Therapy  [name] 05/12/2017, 9:00 AM

## 2017-05-12 NOTE — Progress Notes (Signed)
Occupational Therapy Session Note  Patient Details  Name: Peter [name] MRN: [id number] Date of Birth: June 14, 1961  Today's Date: 05/12/2017 OT Individual Time: 5465-0354 OT Individual Time Calculation (min): 45 min    Short Term Goals: Week 4:  OT Short Term Goal 1 (Week 4): STG=LTG secondary to ELOS  Skilled Therapeutic Interventions/Progress Updates:    Pt seen this session for UE PROM/ AROM to improve joint ROM in BUE to increase functional use of arms. Pt received in bed and opted to stay in bed as his abdomen was still sore from procedure this am. Th. Exercises: -PNF contract/ relax exercises for elbow flex/ext -joint mobilization of carpals/ wrist, PROM of R wrist - isolated finger flex/ext of MPs on R hand  -L wrist AROM followed by further PROM in flex/ ext -AROM of L grasp followed by further PROM  Demonstrated all exercises to his wife and she will work on them this weekend. Will provide her with exercise handouts in tomorrow's session.    Discussed purchasing wrist support splints as pt does not like to use the resting hand splints at night.  Pt resting in room with all needs met.  Therapy Documentation Precautions:  Precautions Precautions: Fall Precaution Comments: trach, PEG, catheter  Required Braces or Orthoses: Other Brace/Splint Other Brace/Splint: bil PRAFO, abdominal binder, bil hand splints at night Restrictions Weight Bearing Restrictions: Yes RLE Weight Bearing: Weight bearing as tolerated LLE Weight Bearing: Weight bearing as tolerated Other Position/Activity Restrictions: no ROM restrictions bil LE  Vital Signs: Therapy Vitals Pulse Rate: 92 BP: (!) 147/79 Pain: Pain Assessment Pain Assessment: No/denies pain ADL: ADL ADL Comments: Please see functional navigator for ADL status  See Function Navigator for Current Functional Status.   Therapy/Group: Individual Therapy  [place] 05/12/2017, 12:47 PM

## 2017-05-13 ENCOUNTER — Inpatient Hospital Stay (HOSPITAL_COMMUNITY): Payer: BLUE CROSS/BLUE SHIELD | Admitting: Physical Therapy

## 2017-05-13 ENCOUNTER — Inpatient Hospital Stay (HOSPITAL_COMMUNITY): Payer: BLUE CROSS/BLUE SHIELD

## 2017-05-13 ENCOUNTER — Inpatient Hospital Stay (HOSPITAL_COMMUNITY): Payer: BLUE CROSS/BLUE SHIELD | Admitting: Occupational Therapy

## 2017-05-13 DIAGNOSIS — N390 Urinary tract infection, site not specified: Secondary | ICD-10-CM

## 2017-05-13 LAB — URINALYSIS, COMPLETE (UACMP) WITH MICROSCOPIC
Bilirubin Urine: NEGATIVE
GLUCOSE, UA: NEGATIVE mg/dL
Ketones, ur: NEGATIVE mg/dL
NITRITE: NEGATIVE
PROTEIN: 100 mg/dL — AB
Specific Gravity, Urine: 1.015 (ref 1.005–1.030)
pH: 6 (ref 5.0–8.0)

## 2017-05-13 LAB — GLUCOSE, CAPILLARY
GLUCOSE-CAPILLARY: 98 mg/dL (ref 65–99)
GLUCOSE-CAPILLARY: 115 mg/dL — AB (ref 65–99)
Glucose-Capillary: 199 mg/dL — ABNORMAL HIGH (ref 65–99)
Glucose-Capillary: 131 mg/dL — ABNORMAL HIGH (ref 65–99)

## 2017-05-13 NOTE — Progress Notes (Signed)
Occupational Therapy Session Note  Patient Details  Name: Peter [name] MRN: [id number] Date of Birth: Oct 29, 1960  Today's Date: 05/13/2017 OT Individual Time: 1015-1100 0OT Individual Time Calculation (min): 45 min   Short Term Goals: Week 1:  OT Short Term Goal 1 (Week 1): Pt will complete self feeding with Mod A and AE PRN OT Short Term Goal 1 - Progress (Week 1): Met OT Short Term Goal 2 (Week 1): Pt will complete LB dressing at sit<stand level with LRAD OT Short Term Goal 2 - Progress (Week 1): Progressing toward goal OT Short Term Goal 3 (Week 1): Pt will BSC transfer with 1 helper and LRAD OT Short Term Goal 3 - Progress (Week 1): Met Week 2:  OT Short Term Goal 1 (Week 2): Pt will complete LB dressing at sit<stand level with LRAD OT Short Term Goal 1 - Progress (Week 2): Discontinued (comment)(duplicate STG) OT Short Term Goal 2 (Week 2): Pt will perform squat pivot transfer to [facility] with max A OT Short Term Goal 2 - Progress (Week 2): Progressing toward goal OT Short Term Goal 3 (Week 2): Pt will perform UB dressing tasks with mod A OT Short Term Goal 3 - Progress (Week 2): Met OT Short Term Goal 4 (Week 2): Pt will perform LB dressing tasks with max A sit<>stand. OT Short Term Goal 4 - Progress (Week 2): Progressing toward goal Week 3:  OT Short Term Goal 1 (Week 3): Pt will perform squat pivot transfer to [facility] with max A OT Short Term Goal 1 - Progress (Week 3): Met OT Short Term Goal 2 (Week 3): Pt will perform LB dressing tasks with max A sit<>stand. OT Short Term Goal 2 - Progress (Week 3): Met OT Short Term Goal 3 (Week 3): Pt will perform UB dressing tasks seated EOB with min A OT Short Term Goal 3 - Progress (Week 3): Met Week 4:  OT Short Term Goal 1 (Week 4): STG=LTG secondary to ELOS  Skilled Therapeutic Interventions/Progress Updates:   Pt taken to gym.  Addressed postural muscle re educ with neck, scapula and fore arm/ hand stretching.  Ppt given written  exercises to do with wife.  Wife was not present but staff reporting wife wanted exercises for room.  Provided the following:     1.  Neck retraction  x5     2, Scapula retraction/ back x5     3. Forearm rotation  x10    4.Separarared finger with forearm up  x10          5. Open fingers with palms down. X10..    OT provided manual techniques to pt's shoulders and scapula for increased mobility proximally.   Pt taken back to room and left with all needs in reach.     Therapy Documentation Precautions:  Precautions Precautions: Fall Precaution Comments: trach, PEG, catheter  Required Braces or Orthoses: Other Brace/Splint Other Brace/Splint: bil PRAFO, abdominal binder, bil hand splints at night Restrictions Weight Bearing Restrictions: Yes RLE Weight Bearing: Weight bearing as tolerated LLE Weight Bearing: Weight bearing as tolerated Other Position/Activity Restrictions: no ROM restrictions bil LE General:   Pain:  None.  Left shoulder popped several time during session due to exercises, but no pain.   ADL: ADL ADL Comments: Please see functional navigator for ADL status  See Function Navigator for Current Functional Status.   Therapy/Group: Individual Therapy  Lisa Roca 05/13/2017, 10:54 AM

## 2017-05-13 NOTE — Progress Notes (Signed)
Occupational Therapy Session Note  Patient Details  Name: Marguerite Jarboe MRN: [id number] Date of Birth: 1960-09-02  Today's Date: 05/13/2017 OT Individual Time: 0800-0900 OT Individual Time Calculation (min): 60 min    Short Term Goals: Week 4:  OT Short Term Goal 1 (Week 4): STG=LTG secondary to ELOS  Skilled Therapeutic Interventions/Progress Updates:    Pt resting in bed upon arrival with sister present.  Pt engaged in LB dressing tasks at bed level and grooming tasks seated in w/c at sink.  Pt incontinent of bowel and required tot A for hygiene.  Pt able to perform partial bridge in bed and assist with pulling up pants.  Pt required assistance moving LLE over edge of bed and initiating sidelying>sit EOB in preparation for slide board transfer to w/c.  Pt performed lateral lean sitting EOB for placement of slide board and performed transfer with steady A.  Pt completed grooming tasks at sink including washing hair, brushing teeth, and washing face.  Pt required assistance with brushing hair secondary to weak grasp.  Pt remained in w/c with all needs within reach and sister present.   Therapy Documentation Precautions:  Precautions Precautions: Fall Precaution Comments: trach, PEG, catheter  Required Braces or Orthoses: Other Brace/Splint Other Brace/Splint: bil PRAFO, abdominal binder, bil hand splints at night Restrictions Weight Bearing Restrictions: Yes RLE Weight Bearing: Weight bearing as tolerated LLE Weight Bearing: Weight bearing as tolerated Other Position/Activity Restrictions: no ROM restrictions bil LE Pain:  Pt denies pain but c/o nausea; RN aware  See Function Navigator for Current Functional Status.   Therapy/Group: Individual Therapy  [name] 05/13/2017, 9:01 AM

## 2017-05-13 NOTE — Progress Notes (Signed)
Occupational Therapy Note  Patient Details  Name: Peter [name] MRN: [id number] Date of Birth: [birth year]  Today's Date: 05/13/2017 OT Individual Time: 1330-1430 OT Individual Time Calculation (min): 60 min   Pt c/o BUE "soreness"; RN aware Individual Therapy  Pt propelled w/c to nursing station and transitioned to therapy gym for BUE therex with 1kg, 2kg, and 3kg balls, including biceps curls and arm raises.  Pt also engaged in B hand grasp exercises and tasks with focus on functional grasp and full extension to release objects.  Pt propelled w/c in gym and ADL apartment before propelling w/c from gym to room.  Pt remained in w/c with all needs within reach and family present.    [name] [facility] 05/13/2017, 2:35 PM

## 2017-05-13 NOTE — Progress Notes (Signed)
[place] PHYSICAL MEDICINE & REHABILITATION     PROGRESS NOTE    Subjective/Complaints: No new complaints.  No problems with PEG site.  Slept fairly well.  Urine has some odor and becoming cloudy again.  ROS: pt denies nausea, vomiting, diarrhea, cough, shortness of breath or chest pain   Objective: Vital Signs: Blood pressure 117/68, pulse (!) 108, temperature (!) 97.4 F (36.3 C), temperature source Oral, resp. rate 18, height 5\' 10"  (1.778 m), weight 83.9 kg (185 lb), SpO2 99 %. No results found. No results for input(s): WBC, HGB, HCT, PLT in the last 72 hours. No results for input(s): NA, K, CL, GLUCOSE, BUN, CREATININE, CALCIUM in the last 72 hours.  Invalid input(s): CO CBG (last 3)  Recent Labs    05/12/17 1700 05/12/17 2129 05/13/17 0640  GLUCAP 126* 116* 98    Wt Readings from Last 3 Encounters:  05/10/17 83.9 kg (185 lb)  03/23/17 74.4 kg (164 lb 0.4 oz)    Physical Exam:  Constitutional: NAD. Vital signs reviewed. HENT: Normocephalicand atraumatic.  Eyes: EOMare normal. No discharge.  Cardiovascular: Regular rate without JVD    Respiratory: Clear with normal effort GI: BS +, non-tender, non-distended, PEG in place  Skin. Warm and dry, intact Musculoskeletal. No edema. No tenderness.  Genitourinary.  + Suprapubic cath in place and draining, urine cloudy with odor Neurological. Alert and oriented 3.  Follows simple commands.   B/l UE Bilateral 3+/5 Deltoid, biceps, triceps and 3-4/5 HI.  B/l LE: 2/5 HF, KE,3-/5  ADF,APF--  Generalized muscle wasting throughout Psych: affect remains flat  Assessment/Plan: 1. 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 Physiatrist is providing close team supervision and 24 hour management of active medical problems listed below. Physiatrist and rehab team continue  to assess barriers to discharge/monitor patient progress toward functional and medical goals.  Function:  Bathing Bathing position Bathing activity did not occur: N/A(night bath) Position: Bed  Bathing parts Body parts bathed by patient: Chest, Abdomen Body parts bathed by helper: Right arm, Right lower leg, Left arm, Left lower leg, Chest, Abdomen, Back, Front perineal area, Buttocks, Right upper leg, Left upper leg  Bathing assist Assist Level: Touching or steadying assistance(Pt > 75%)      Upper Body Dressing/Undressing Upper body dressing   What is the patient wearing?: Pull over shirt/dress     Pull over shirt/dress - Perfomed by patient: Thread/unthread right sleeve Pull over shirt/dress - Perfomed by helper: Thread/unthread left sleeve, Put head through opening, Pull shirt over trunk        Upper body assist Assist Level: Touching or steadying assistance(Pt > 75%)      Lower Body Dressing/Undressing Lower body dressing   What is the patient wearing?: Pants, Non-skid slipper socks, Ted Hose       Pants- Performed by helper: Thread/unthread right pants leg, Thread/unthread left pants leg, Pull pants up/down Non-skid slipper socks- Performed by patient: Don/doff right sock, Don/doff left sock Non-skid slipper socks- Performed by helper: Don/doff right sock, Don/doff left sock               TED Hose - Performed by helper: Don/doff right TED hose, Don/doff left TED hose  Lower body assist Assist for lower body dressing: Touching or steadying assistance (Pt > 75%)      Toileting Toileting Toileting activity did not occur: No continent bowel/bladder event Toileting steps completed  by patient: Adjust clothing prior to toileting, Adjust clothing after toileting Toileting steps completed by helper: Adjust clothing prior to toileting, Performs perineal hygiene, Adjust clothing after toileting    Toileting assist Assist level: Touching or steadying assistance (Pt.75%),  Set up/obtain supplies   Transfers Chair/bed transfer   Chair/bed transfer method: Lateral scoot Chair/bed transfer assist level: Maximal assist (Pt 25 - 49%/lift and lower) Chair/bed transfer assistive device: Armrests Mechanical lift: Stedy   Locomotion Ambulation Ambulation activity did not occur: Safety/medical concerns   Max distance: 10 Assist level: Moderate assist (Pt 50 - 74%)(w/c follow)   Wheelchair   Type: Manual Max wheelchair distance: 13' Assist Level: Supervision or verbal cues  Cognition Comprehension Comprehension assist level: Follows complex conversation/direction with extra time/assistive device  Expression Expression assist level: Expresses complex ideas: With extra time/assistive device  Social Interaction Social Interaction assist level: Interacts appropriately with others - No medications needed.  Problem Solving Problem solving assist level: Solves complex problems: With extra time  Memory Memory assist level: More than reasonable amount of time   Medical Problem List and Plan: 1. 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   -Estimated length of stay 11/30 2. DVT Prophylaxis/Anticoagulation: Subcutaneous Lovenox.  3. Pain Management: Oxycodone 5 mg every 6 hours as needed -fair control 4. Mood:   trazodone 50 mg daily at bedtime, Seroquel 100 mg daily at bedtime, Zoloft 50 mg daily, amantadine 100 mg daily, Provigil 100 mg daily   -improved overall  -klonopin stopped   -begin to taper seroquel this weekend 5. Neuropsych: This patient iscapable of making decisions on hisown behalf. 6. Skin/Wound Care: Routine skin checks  - continue air mattress due to sensitive skin 7. Fluids/Electrolytes/Nutrition: Routine I&Os  -replacing k+    l   8.Nondisplaced fractures of right T7 transverse process.  Conservative care 9.Multiple pelvic fractures/bilateral sacroiliac joints and suprapubic symphysis as well as left pelvic sidewall hematoma. Status post sacroiliac screw fixation with external fixation pelvis anterior pelvic ring closed reduction 02/07/2017. May ambulate extended distances weightbearing as tolerated without restrictions. 10.Seizure prophylaxis. Keppra 500 mg every 12 hours. EEG negative 11.Tracheostomy tube 03/01/2017 per Dr. Hulen Skains. Decannulated. 12.GI: Gastrostomy tube 03/01/2017 per Dr. Hulen Skains. Presently not receiving tube feeds.   -Removed PEG yesterday easily with traction.  Tolerating regular diet 13.Urinary retention/urethral injury. Status post suprapubic catheter placement 03/21/2017 per interventional radiology. Follow-up urology services Dr. Deliah Boston plan of care with urology before discharge (next week)  -SPC  changed cath out 04/27/2017  -completed 7 day course of Bactrim for UTI.  Will recheck urine again given presentation above 14.Acute blood loss anemia. Continue iron supplement   Hemoglobin up to 10.0      15. DM 2:   -  added carb modified restriction  -amaryl 2mg  resumed with improvement   CBG (last 3)  Recent Labs    05/12/17 1700 05/12/17 2129 05/13/17 0640  GLUCAP 126* 116* 98    -Good control at present 16. Tachycardia   -due to deconditioning   -metoprolol increased on 11/18  -Improving control  17. Elevated blood pressure   metoprolol increased to 50 on 11/18  -better control overall  LOS (Days) 28 A FACE TO FACE EVALUATION WAS PERFORMED  Alger Simons T, MD 05/13/2017 11:30 AM

## 2017-05-13 NOTE — Progress Notes (Signed)
Physical Therapy Session Note  Patient Details  Name: Cornellius Kropp MRN: [id number] Date of Birth: [birth year]  Today's Date: 05/13/2017 PT Individual Time: 1500-1600 PT Individual Time Calculation (min): 60 min   Short Term Goals: Week 4:  PT Short Term Goal 1 (Week 4): =LTG due to estimated LOS; minA overall from w/c level  Skilled Therapeutic Interventions/Progress Updates:   PT treatment session focused WC mobility in various environments. WC propelled with BUE in hall of rehab unit x [phone] with supervision assist.  PT transported pt to hospital gift shop. Simulated WC mobility in community environments of hospital gift shop x [phone] Min cues from PT for tuning technique and management of tight areas. Simulated community WC mobility also instructed at entrance of hospital.  SB transfer to and from mat table with min assist with level transfer. Pt unable to perform uphill transfer due to fatigue from previous treatments in day. Min cues for proper use of BUE and stabilization of BLE from PT.   [facility] and all needs met.         Therapy Documentation Precautions:  Precautions Precautions: Fall Precaution Comments: trach, PEG, catheter  Required Braces or Orthoses: Other Brace/Splint Other Brace/Splint: bil PRAFO, abdominal binder, bil hand splints at night Restrictions Weight Bearing Restrictions: Yes RLE Weight Bearing: Weight bearing as tolerated LLE Weight Bearing: Weight bearing as tolerated Other Position/Activity Restrictions: no ROM restrictions bil LE   See Function Navigator for Current Functional Status.   Therapy/Group: Individual Therapy  AuLorie Phenix1/24/2018, 9:09 PM

## 2017-05-13 NOTE — Plan of Care (Signed)
  Progressing RH SKIN INTEGRITY RH STG SKIN FREE OF INFECTION/BREAKDOWN Description Mod assist  05/13/2017 2141 - Progressing by Edd Arbour, RN RH STG MAINTAIN SKIN INTEGRITY WITH ASSISTANCE Description STG Maintain Skin Integrity With Assistance. Mod assist  05/13/2017 2141 - Progressing by Edd Arbour, RN RH PAIN MANAGEMENT RH STG PAIN MANAGED AT OR BELOW PT'S PAIN GOAL Description Less than 4.  05/13/2017 2141 - Progressing by Edd Arbour, RN RH BOWEL ELIMINATION RH STG MANAGE BOWEL WITH ASSISTANCE Description STG Manage Bowel with mod  Assistance.  05/13/2017 2141 - Progressing by Edd Arbour, RN RH STG MANAGE BOWEL W/MEDICATION W/ASSISTANCE Description STG Manage Bowel with Medication with min Assistance.  05/13/2017 2141 - Progressing by Edd Arbour, RN RH SKIN INTEGRITY RH STG SKIN FREE OF INFECTION/BREAKDOWN 05/13/2017 2141 - Progressing by Edd Arbour, RN RH STG MAINTAIN SKIN INTEGRITY WITH ASSISTANCE Description STG Maintain Skin Integrity With [place].  05/13/2017 2141 - Progressing by Edd Arbour, RN RH SAFETY RH STG ADHERE TO SAFETY PRECAUTIONS W/ASSISTANCE/DEVICE Description STG Adhere to Safety Precautions With Assistance/Device. 05/13/2017 2141 - Progressing by Edd Arbour, RN RH STG DECREASED RISK OF FALL WITH ASSISTANCE Description STG Decreased Risk of Fall With Assistance. 05/13/2017 2141 - Progressing by Edd Arbour, RN RH PAIN MANAGEMENT RH STG PAIN MANAGED AT OR BELOW PT'S PAIN GOAL 05/13/2017 2141 - Progressing by Edd Arbour, RN

## 2017-05-14 DIAGNOSIS — A499 Bacterial infection, unspecified: Secondary | ICD-10-CM

## 2017-05-14 LAB — GLUCOSE, CAPILLARY
GLUCOSE-CAPILLARY: 207 mg/dL — AB (ref 65–99)
GLUCOSE-CAPILLARY: 115 mg/dL — AB (ref 65–99)
Glucose-Capillary: 135 mg/dL — ABNORMAL HIGH (ref 65–99)
Glucose-Capillary: 70 mg/dL (ref 65–99)

## 2017-05-14 MED ORDER — SULFAMETHOXAZOLE-TRIMETHOPRIM 800-160 MG PO TABS: 1.0000 | ORAL_TABLET | Freq: Two times a day (BID) | ORAL | Status: DC

## 2017-05-14 MED ORDER — SULFAMETHOXAZOLE-TRIMETHOPRIM 800-160 MG PO TABS
1.0000 | ORAL_TABLET | Freq: Two times a day (BID) | ORAL | Status: DC
  Administered 2017-05-14 – 2017-05-19 (×11): 1 via ORAL
  Filled 2017-05-14 (×11): qty 1

## 2017-05-14 MED ORDER — QUETIAPINE FUMARATE 50 MG PO TABS
50.0000 mg | ORAL_TABLET | Freq: Every day | ORAL | Status: DC
  Administered 2017-05-14 – 2017-05-15 (×2): 50 mg via ORAL
  Filled 2017-05-14 (×2): qty 1

## 2017-05-14 NOTE — Progress Notes (Signed)
[place] PHYSICAL MEDICINE & REHABILITATION     PROGRESS NOTE    Subjective/Complaints: Had a reasonable night.  No stomach pain.  Still notes some swelling in his legs  ROS: pt denies nausea, vomiting, diarrhea, cough, shortness of breath or chest pain   Objective: Vital Signs: Blood pressure 126/74, pulse (!) 106, temperature 97.9 F (36.6 C), temperature source Oral, resp. rate 16, height 5\' 10"  (1.778 m), weight 83.9 kg (185 lb), SpO2 99 %. No results found. No results for input(s): WBC, HGB, HCT, PLT in the last 72 hours. No results for input(s): NA, K, CL, GLUCOSE, BUN, CREATININE, CALCIUM in the last 72 hours.  Invalid input(s): CO CBG (last 3)  Recent Labs    05/13/17 1620 05/13/17 2038 05/14/17 0618  GLUCAP 115* 131* 135*    Wt Readings from Last 3 Encounters:  05/10/17 83.9 kg (185 lb)  03/23/17 74.4 kg (164 lb 0.4 oz)    Physical Exam:  Constitutional: NAD. Vital signs reviewed. HENT: Normocephalicand atraumatic.  Eyes: EOMare normal. No discharge.  Cardiovascular: Regular rate and rhythm Respiratory: Clear with normal effort GI: Bowel sounds positive, abdomen nontender.  PEG is out in wound has already closed/dry Skin. Warm and dry, intact Musculoskeletal. No edema. No tenderness.  Genitourinary.  + Suprapubic cath in place and draining, urine remains cloudy Neurological. Alert and oriented 3.  Follows simple commands.   B/l UE Bilateral 3+/5 Deltoid, biceps, triceps and 3-4/5 HI.  B/l LE: 2/5 HF, KE,3-/5  ADF,APF--  Generalized muscle wasting throughout Psych: affect remains flat  Assessment/Plan: 1. 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 Physiatrist is providing close team supervision and 24 hour management of active medical problems listed below. Physiatrist and rehab team continue to  assess barriers to discharge/monitor patient progress toward functional and medical goals.  Function:  Bathing Bathing position Bathing activity did not occur: N/A(night bath) Position: Bed  Bathing parts Body parts bathed by patient: Chest, Abdomen Body parts bathed by helper: Right arm, Right lower leg, Left arm, Left lower leg, Chest, Abdomen, Back, Front perineal area, Buttocks, Right upper leg, Left upper leg  Bathing assist Assist Level: Touching or steadying assistance(Pt > 75%)      Upper Body Dressing/Undressing Upper body dressing   What is the patient wearing?: Pull over shirt/dress     Pull over shirt/dress - Perfomed by patient: Thread/unthread right sleeve Pull over shirt/dress - Perfomed by helper: Thread/unthread left sleeve, Put head through opening, Pull shirt over trunk        Upper body assist Assist Level: Touching or steadying assistance(Pt > 75%)      Lower Body Dressing/Undressing Lower body dressing   What is the patient wearing?: Pants, Non-skid slipper socks, Ted Hose       Pants- Performed by helper: Thread/unthread right pants leg, Thread/unthread left pants leg, Pull pants up/down Non-skid slipper socks- Performed by patient: Don/doff right sock, Don/doff left sock Non-skid slipper socks- Performed by helper: Don/doff right sock, Don/doff left sock               TED Hose - Performed by helper: Don/doff right TED hose, Don/doff left TED hose  Lower body assist Assist for lower body dressing: Touching or steadying assistance (Pt > 75%)      Toileting Toileting Toileting activity did not occur: No continent bowel/bladder event Toileting steps completed by patient: Adjust clothing prior  to toileting, Adjust clothing after toileting Toileting steps completed by helper: Adjust clothing prior to toileting, Performs perineal hygiene, Adjust clothing after toileting    Toileting assist Assist level: Touching or steadying assistance (Pt.75%), Set  up/obtain supplies   Transfers Chair/bed transfer   Chair/bed transfer method: Lateral scoot Chair/bed transfer assist level: Maximal assist (Pt 25 - 49%/lift and lower) Chair/bed transfer assistive device: Armrests Mechanical lift: Stedy   Locomotion Ambulation Ambulation activity did not occur: Safety/medical concerns   Max distance: 10 Assist level: Moderate assist (Pt 50 - 74%)(w/c follow)   Wheelchair   Type: Manual Max wheelchair distance: 39' Assist Level: Supervision or verbal cues  Cognition Comprehension Comprehension assist level: Follows complex conversation/direction with extra time/assistive device  Expression Expression assist level: Expresses complex ideas: With extra time/assistive device  Social Interaction Social Interaction assist level: Interacts appropriately with others - No medications needed.  Problem Solving Problem solving assist level: Solves complex problems: With extra time  Memory Memory assist level: More than reasonable amount of time   Medical Problem List and Plan: 1. 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  2. DVT Prophylaxis/Anticoagulation: Subcutaneous Lovenox.  3. Pain Management: Oxycodone 5 mg every 6 hours as needed -fair control 4. Mood:   trazodone 50 mg daily at bedtime, Seroquel 100 mg daily at bedtime, Zoloft 50 mg daily, amantadine 100 mg daily, Provigil 100 mg daily   -improved overall  -klonopin stopped   -Decrease Seroquel to 50 mg.  Already seeing benefits of decreased sedatives.  Likely can decrease his stimulants subsequently during the day 5. Neuropsych: This patient iscapable of making decisions on hisown behalf. 6. Skin/Wound Care: Routine skin checks  - continue air mattress due to sensitive skin 7. Fluids/Electrolytes/Nutrition: Routine I&Os  -replacing k+        8.Nondisplaced fractures of right T7 transverse process. Conservative care 9.Multiple pelvic fractures/bilateral sacroiliac joints and suprapubic symphysis as well as left pelvic sidewall hematoma. Status post sacroiliac screw fixation with external fixation pelvis anterior pelvic ring closed reduction 02/07/2017. May ambulate extended distances weightbearing as tolerated without restrictions. 10.Seizure prophylaxis. Keppra 500 mg every 12 hours. EEG negative 11.Tracheostomy tube 03/01/2017 per Dr. Hulen Skains. Decannulated. 12.GI: Gastrostomy tube 03/01/2017 per Dr. Hulen Skains. Presently not receiving tube feeds.   -Removed PEG    Tolerating regular diet  13.Urinary retention/urethral injury. Status post suprapubic catheter placement 03/21/2017 per interventional radiology. Follow-up urology services Dr. Deliah Boston plan of care with urology before discharge (next week)  -SPC  changed cath out 04/27/2017  -.urine again positive for 100,000 Proteus.  Begin Bactrim for 7-day course 14.Acute blood loss anemia. Continue iron supplement   Hemoglobin up to 10.0      15. DM 2:   -  added carb modified restriction  -amaryl 2mg  resumed with improvement   CBG (last 3)  Recent Labs    05/13/17 1620 05/13/17 2038 05/14/17 0618  GLUCAP 115* 131* 135*    -Good control at present 16. Tachycardia   -due to deconditioning   -metoprolol increased on 11/18  -Improved control  17. Elevated blood pressure   metoprolol increased to 50 on 11/18  -better control overall  LOS (Days) 29 A FACE TO FACE EVALUATION WAS PERFORMED  [name], MD 05/14/2017 9:40 AM

## 2017-05-14 NOTE — Plan of Care (Signed)
  Progressing RH SKIN INTEGRITY RH STG SKIN FREE OF INFECTION/BREAKDOWN Description Mod assist  05/14/2017 2158 - Progressing by Edd Arbour, RN RH STG MAINTAIN SKIN INTEGRITY WITH ASSISTANCE Description STG Maintain Skin Integrity With Assistance. Mod assist  05/14/2017 2158 - Progressing by Edd Arbour, RN RH PAIN MANAGEMENT RH STG PAIN MANAGED AT OR BELOW PT'S PAIN GOAL Description Less than 4.  05/14/2017 2158 - Progressing by Edd Arbour, RN RH BOWEL ELIMINATION RH STG MANAGE BOWEL WITH ASSISTANCE Description STG Manage Bowel with mod  Assistance.  05/14/2017 2158 - Progressing by Edd Arbour, RN RH STG MANAGE BOWEL W/MEDICATION W/ASSISTANCE Description STG Manage Bowel with Medication with min Assistance.  05/14/2017 2158 - Progressing by Edd Arbour, RN RH BLADDER ELIMINATION RH STG MANAGE BLADDER WITH EQUIPMENT WITH ASSISTANCE Description STG Manage Bladder With Equipment With Mod Assistance  05/14/2017 2158 - Progressing by Edd Arbour, RN [facility] SKIN INTEGRITY RH STG SKIN FREE OF INFECTION/BREAKDOWN 05/14/2017 2158 - Progressing by Edd Arbour, RN RH STG MAINTAIN SKIN INTEGRITY WITH ASSISTANCE Description STG Maintain Skin Integrity With [place].  05/14/2017 2158 - Progressing by Edd Arbour, RN RH SAFETY RH STG ADHERE TO SAFETY PRECAUTIONS W/ASSISTANCE/DEVICE Description STG Adhere to Safety Precautions With Assistance/Device. 05/14/2017 2158 - Progressing by Edd Arbour, RN RH STG DECREASED RISK OF FALL WITH ASSISTANCE Description STG Decreased Risk of Fall With Assistance. 05/14/2017 2158 - Progressing by Edd Arbour, RN RH PAIN MANAGEMENT RH STG PAIN MANAGED AT OR BELOW PT'S PAIN GOAL 05/14/2017 2158 - Progressing by Edd Arbour, RN

## 2017-05-15 ENCOUNTER — Inpatient Hospital Stay (HOSPITAL_COMMUNITY): Payer: BLUE CROSS/BLUE SHIELD

## 2017-05-15 ENCOUNTER — Encounter (HOSPITAL_COMMUNITY): Payer: Self-pay

## 2017-05-15 ENCOUNTER — Inpatient Hospital Stay (HOSPITAL_COMMUNITY): Payer: BLUE CROSS/BLUE SHIELD | Admitting: Physical Therapy

## 2017-05-15 LAB — GLUCOSE, CAPILLARY
GLUCOSE-CAPILLARY: 144 mg/dL — AB (ref 65–99)
GLUCOSE-CAPILLARY: 103 mg/dL — AB (ref 65–99)
Glucose-Capillary: 197 mg/dL — ABNORMAL HIGH (ref 65–99)
Glucose-Capillary: 112 mg/dL — ABNORMAL HIGH (ref 65–99)

## 2017-05-15 MED ORDER — MODAFINIL 100 MG PO TABS
50.0000 mg | ORAL_TABLET | Freq: Every day | ORAL | Status: DC
  Administered 2017-05-16 – 2017-05-17 (×2): 50 mg via ORAL
  Filled 2017-05-15 (×2): qty 1

## 2017-05-15 NOTE — Progress Notes (Signed)
Occupational Therapy Session Note  Patient Details  Name: Sammie Denner MRN: [id number] Date of Birth: 04/14/1961  Today's Date: 05/15/2017 OT Individual Time: 0800-0900 OT Individual Time Calculation (min): 60 min    Short Term Goals: Week 4:  OT Short Term Goal 1 (Week 4): STG=LTG secondary to ELOS  Skilled Therapeutic Interventions/Progress Updates:    Pt resting in bed upon arrival.  Initial focus on self feeding at supervision level.  Pt required assistance with opening containers.  Pt attempted to pull tab of soda can but was unsuccessful.  Pt engaged in bed mobility and rolling to facilitated pulling up pants.  Pt initiated pulling pants but required assistance with with pulling over hips.  Pt was able to move BLE over edge of bed and required min A only to initiate supine>sit EOB.  Pt performed lateral lean on EOB for placement of slide board.  Pt performed slide board transfer with steady A.  Pt completed grooming tasks seated in w/c at sink-supervision level.  Pt remained in w/c with all needs within reach.   Therapy Documentation Precautions:  Precautions Precautions: Fall Precaution Comments: trach, PEG, catheter  Required Braces or Orthoses: Other Brace/Splint Other Brace/Splint: bil PRAFO, abdominal binder, bil hand splints at night Restrictions Weight Bearing Restrictions: Yes RLE Weight Bearing: Weight bearing as tolerated LLE Weight Bearing: Weight bearing as tolerated Other Position/Activity Restrictions: no ROM restrictions bil LE   Pain: Pain Assessment Pain Assessment: No/denies pain  See Function Navigator for Current Functional Status.   Therapy/Group: Individual Therapy  [name] 05/15/2017, 10:03 AM

## 2017-05-15 NOTE — Progress Notes (Signed)
[facility] PHYSICAL MEDICINE & REHABILITATION     PROGRESS NOTE    Subjective/Complaints: Didn't sleep well as neighbor kept him up.   ROS: pt denies nausea, vomiting, diarrhea, cough, shortness of breath or chest pain   Objective: Vital Signs: Blood pressure 124/74, pulse 91, temperature 98.1 F (36.7 C), temperature source Oral, resp. rate 16, height 5\' 10"  (1.778 m), weight 83.9 kg (185 lb), SpO2 99 %. No results found. No results for input(s): WBC, HGB, HCT, PLT in the last 72 hours. No results for input(s): NA, K, CL, GLUCOSE, BUN, CREATININE, CALCIUM in the last 72 hours.  Invalid input(s): CO CBG (last 3)  Recent Labs    05/14/17 1629 05/14/17 2037 05/15/17 0611  GLUCAP 70 115* 144*    Wt Readings from Last 3 Encounters:  05/10/17 83.9 kg (185 lb)  03/23/17 74.4 kg (164 lb 0.4 oz)    Physical Exam:  Constitutional: NAD. Vital signs reviewed. HENT: Normocephalicand atraumatic.  Eyes: EOMare normal. No discharge.  Cardiovascular:RRR without murmur. No JVD  Respiratory: CTA Bilaterally without wheezes or rales. Normal effort  GI: Bowel sounds positive, abdomen nontender.  PEG is out in wound has already closed/dry Skin. Warm and dry, intact Musculoskeletal. No edema. No tenderness.  Genitourinary.  + Suprapubic cath in place and draining, urine   cloudy Neurological. Much more alert.  Follows simple commands.   B/l UE Bilateral 3+/5 Deltoid, biceps, triceps and 3-4/5 HI.  B/l LE: 2/5 HF, KE,3-/5  ADF,APF--  Generalized muscle wasting throughout Psych: affect more dynamic   Assessment/Plan: 1. 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 Physiatrist is providing close team supervision and 24 hour management of active medical problems listed below. Physiatrist and rehab team continue to assess barriers to  discharge/monitor patient progress toward functional and medical goals.  Function:  Bathing Bathing position Bathing activity did not occur: N/A(night bath) Position: Bed  Bathing parts Body parts bathed by patient: Chest, Abdomen Body parts bathed by helper: Right arm, Right lower leg, Left arm, Left lower leg, Chest, Abdomen, Back, Front perineal area, Buttocks, Right upper leg, Left upper leg  Bathing assist Assist Level: Touching or steadying assistance(Pt > 75%)      Upper Body Dressing/Undressing Upper body dressing   What is the patient wearing?: Pull over shirt/dress     Pull over shirt/dress - Perfomed by patient: Thread/unthread right sleeve Pull over shirt/dress - Perfomed by helper: Thread/unthread left sleeve, Put head through opening, Pull shirt over trunk        Upper body assist Assist Level: Touching or steadying assistance(Pt > 75%)      Lower Body Dressing/Undressing Lower body dressing   What is the patient wearing?: Pants, Non-skid slipper socks, Ted Hose       Pants- Performed by helper: Thread/unthread right pants leg, Thread/unthread left pants leg, Pull pants up/down Non-skid slipper socks- Performed by patient: Don/doff right sock, Don/doff left sock Non-skid slipper socks- Performed by helper: Don/doff right sock, Don/doff left sock               TED Hose - Performed by helper: Don/doff right TED hose, Don/doff left TED hose  Lower body assist Assist for lower body dressing: Touching or steadying assistance (Pt > 75%)      Toileting Toileting Toileting activity did not occur: No continent bowel/bladder event Toileting steps completed by patient: Adjust clothing prior to  toileting, Adjust clothing after toileting Toileting steps completed by helper: Adjust clothing prior to toileting, Performs perineal hygiene, Adjust clothing after toileting    Toileting assist Assist level: Touching or steadying assistance (Pt.75%), Set up/obtain  supplies   Transfers Chair/bed transfer   Chair/bed transfer method: Lateral scoot Chair/bed transfer assist level: Maximal assist (Pt 25 - 49%/lift and lower) Chair/bed transfer assistive device: Armrests Mechanical lift: Stedy   Locomotion Ambulation Ambulation activity did not occur: Safety/medical concerns   Max distance: 10 Assist level: Moderate assist (Pt 50 - 74%)(w/c follow)   Wheelchair   Type: Manual Max wheelchair distance: 31' Assist Level: Supervision or verbal cues  Cognition Comprehension Comprehension assist level: Follows complex conversation/direction with extra time/assistive device  Expression Expression assist level: Expresses complex ideas: With extra time/assistive device  Social Interaction Social Interaction assist level: Interacts appropriately with others - No medications needed.  Problem Solving Problem solving assist level: Solves complex problems: With extra time  Memory Memory assist level: More than reasonable amount of time   Medical Problem List and Plan: 1. 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  2. DVT Prophylaxis/Anticoagulation: Subcutaneous Lovenox.  3. Pain Management: Oxycodone 5 mg every 6 hours as needed -fair control 4. Mood:   trazodone 50 mg daily at bedtime, Seroquel  daily at bedtime, Zoloft 50 mg daily, amantadine 100 mg daily, Provigil 100 mg daily   -improved overall  -klonopin stopped   -Decreased Seroquel to 50 mg.  Already seeing benefits of decreased sedatives.     - decrease provigil to 50mg  beginning tomorrow 5. Neuropsych: This patient iscapable of making decisions on hisown behalf. 6. Skin/Wound Care: Routine skin checks  - continue air mattress due to sensitive skin 7. Fluids/Electrolytes/Nutrition: Routine I&Os  -replacing k+       8.Nondisplaced  fractures of right T7 transverse process. Conservative care 9.Multiple pelvic fractures/bilateral sacroiliac joints and suprapubic symphysis as well as left pelvic sidewall hematoma. Status post sacroiliac screw fixation with external fixation pelvis anterior pelvic ring closed reduction 02/07/2017. May ambulate extended distances weightbearing as tolerated without restrictions. 10.Seizure prophylaxis. Keppra 500 mg every 12 hours. EEG negative 11.Tracheostomy tube 03/01/2017 per Dr. Hulen Skains. Decannulated. 12.GI: Gastrostomy tube 03/01/2017 per Dr. Hulen Skains.  .   -Removed PEG without issues    Tolerating regular diet  13.Urinary retention/urethral injury. Status post suprapubic catheter placement 03/21/2017 per interventional radiology. Follow-up urology services Dr. Deliah Boston plan of care with urology before discharge (next week)  -[facility]  changed cath out 04/27/2017  -.urine again positive for 100,000 Proteus.  Begin Bactrim: continue for 10-day course given recurrence 14.Acute blood loss anemia. Continue iron supplement   Hemoglobin up to 10.0      15. DM 2:   -  added carb modified restriction  -amaryl 2mg  resumed with improvement   CBG (last 3)  Recent Labs    05/14/17 1629 05/14/17 2037 05/15/17 0611  GLUCAP 70 115* 144*    -Good control at present 16. Tachycardia   -due to deconditioning   -metoprolol increased on 11/18  -Improved control  17. Elevated blood pressure   metoprolol increased to 50 on 11/18  -better control overall  LOS (Days) 30 A FACE TO FACE EVALUATION WAS PERFORMED  [name], MD 05/15/2017 8:28 AM

## 2017-05-15 NOTE — Progress Notes (Signed)
Occupational Therapy Note  Patient Details  Name: Peter [name] MRN: [id number] Date of Birth: 04-19-1961  Today's Date: 05/15/2017 OT Concurrent Time: 1300-1400 OT Concurrent Time Calculation (min): 60 min  Pt c/o "tightness" in B upper trapezius area; soft tissue mobs Concurrent therapy  Pt engaged in [place] activities with focus on BUE strengthening and B grasp strengthening.  Pt participated in boxing game requiring B grasp of controls and rapid movement of BUE.  Pt transitioned to participating in baseball game requiring L grasp of control with timed rapid UE movement.  Pt successfully grasped controls throughout all games.  Pt propelled back to room and positioned w/c next to bed with all needs within reach.    [name] [facility] 05/15/2017, 3:00 PM

## 2017-05-15 NOTE — Progress Notes (Signed)
Physical Therapy Session Note  Patient Details  Name: Peter [name] MRN: [id number] Date of Birth: 1960/10/05  Today's Date: 05/15/2017 PT Individual Time: 1000-1045 and 1515-1600 PT Individual Time Calculation (min): 45 min and 45 min (total 90 min)   Short Term Goals: Week 4:  PT Short Term Goal 1 (Week 4): =LTG due to estimated LOS; minA overall from w/c level  Skilled Therapeutic Interventions/Progress Updates: Pt received seated in w/c, denies pain and agreeable to treatment. W/c propulsion x100' with BUE and S. Reinforced education regarding pressure relief after pt reports he spent 8-10 hours in the w/c on Saturday and Sunday, states he performed pressure relief "once or twice". Transfer w/c >mat table with minA and transfer board. Sit <>stand x2 with eva walker and minA. Gait with eva walker min/modA x20' with w/c follow for safety. Squat pivot transfer w/c>mat modA for energy conservation. Sit <>stand and standing steps in place x2 trials with RW; modA to stand, then min guard for standing balance. Stand pivot transfer mat>w/c modA, w/c <>mat with minA on later trials. Soft tissue massage to R upper traps performed during rest breaks d/t pt c/o pain "crick in my neck". Returned to room totalA; remained seated in w/c at end of session, all needs in reach.   Tx 2: Pt received seated in w/c and denies pain, wife present. Performed stand pivot transfer w/c >mat table RW and maxA d/t poor placement of LEs and impulsively sitting prior to completing turn. Performed sit <>stand x3 trials from mat with modA. Gait x5' including turn, and gait x10' including turns with min/modA; wife providing w/c follow. Improving LE placement, requires cues for glute activation to maintain neutral postural alignment and COM centered over feet. Performed car transfer with slideboard and wife providing minA; wife requires cues and demonstration for board placement, and educated on assisting pt with foot placement  and technique to ensure safety. Returned to room totalA d/t fatigue. Stand pivot transfer w/c >bed with RW and min/modA with improved foot placement and coordination. ModA sit >supine. Remained supine in bed at end of session, all needs in reach.       Therapy Documentation Precautions:  Precautions Precautions: Fall Precaution Comments: trach, PEG, catheter  Required Braces or Orthoses: Other Brace/Splint Other Brace/Splint: bil PRAFO, abdominal binder, bil hand splints at night Restrictions Weight Bearing Restrictions: Yes RLE Weight Bearing: Weight bearing as tolerated LLE Weight Bearing: Weight bearing as tolerated Other Position/Activity Restrictions: no ROM restrictions bil LE Pain: Pain Assessment Pain Assessment: No/denies pain   See Function Navigator for Current Functional Status.   Therapy/Group: Individual Therapy  Luberta Mutter 05/15/2017, 10:47 AM

## 2017-05-16 ENCOUNTER — Inpatient Hospital Stay (HOSPITAL_COMMUNITY): Payer: BLUE CROSS/BLUE SHIELD | Admitting: Physical Therapy

## 2017-05-16 ENCOUNTER — Inpatient Hospital Stay (HOSPITAL_COMMUNITY): Payer: BLUE CROSS/BLUE SHIELD

## 2017-05-16 LAB — GLUCOSE, CAPILLARY
GLUCOSE-CAPILLARY: 128 mg/dL — AB (ref 65–99)
GLUCOSE-CAPILLARY: 198 mg/dL — AB (ref 65–99)
GLUCOSE-CAPILLARY: 116 mg/dL — AB (ref 65–99)
GLUCOSE-CAPILLARY: 140 mg/dL — AB (ref 65–99)

## 2017-05-16 LAB — URINE CULTURE

## 2017-05-16 MED ORDER — QUETIAPINE FUMARATE 25 MG PO TABS
25.0000 mg | ORAL_TABLET | Freq: Every day | ORAL | Status: DC
  Administered 2017-05-16: 25 mg via ORAL
  Filled 2017-05-16: qty 1

## 2017-05-16 NOTE — Progress Notes (Signed)
Physical Therapy Session Note  Patient Details  Name: Peter [name] MRN: [id number] Date of Birth: 1961-03-01  Today's Date: 05/16/2017 PT Individual Time: 1000-1100 and 1300-1400 PT Individual Time Calculation (min): 60 min and 60 min (total 120 min)   Short Term Goals: Week 4:  PT Short Term Goal 1 (Week 4): =LTG due to estimated LOS; minA overall from w/c level  Skilled Therapeutic Interventions/Progress Updates: Tx 1: Pt received seated in w/c, denies pain but does report feeling very weak; BP assessed 102/67, TEDs intact. Discussed w/c use at home; wife has pictures and measurements of w/c family member has previously used and reports it is accessible in the home. Standing frame x3 trials of 2-3 min for focus on gastroc/soleus stretching for carryover into functional gait. Educated pt and wife on orthostatic hypotension, goal of increasing upright tolerance over time. Pt does become orthstatic in standing frame, seated rest breaks as needed. Gait x3 trials x12-15' each with RW and min/modA for postural control, LLE placement and tactile cueing at L knee for stance. LLE with occasional scissoring, increased external rotation at hip and requires visual attention to ensure proper foot placement. Improved midline control following gastroc/soleus stretching compared to yesterday with reduced assist required to prevent posterior LOB. W/c propulsion x100' to return to room with BUE for strengthening and aerobic endurance. Remained seated in w/c at end of session, all needs in reach.   Tx 2: Pt received seated in w/c, denies pain and agreeable to treatment. Pt's wife present to participate in family education. Pt transported to/from main entrance of hospital to practice car transfer to family vehicle. Performed x2 trials in/out of car with transfer board; pt's wife initially assisting with transfer however switched with therapist after pt began having difficulty. Required cueing for head/hips  relationship and hand placement with pt reaching BUEs into car to try and pull himself in. Improved with cueing for anterior weight shift and tactile cues for hand placement. Wife performed second transfer in/out of car with min cues from therapist for technique. Wife performed transfer to return to bed from w/c with min cues for setup. Remained supine in bed at end of session, all needs in reach.      Therapy Documentation Precautions:  Precautions Precautions: Fall Precaution Comments: trach, PEG, catheter  Required Braces or Orthoses: Other Brace/Splint Other Brace/Splint: bil PRAFO, abdominal binder, bil hand splints at night Restrictions Weight Bearing Restrictions: Yes RLE Weight Bearing: Weight bearing as tolerated LLE Weight Bearing: Weight bearing as tolerated Other Position/Activity Restrictions: no ROM restrictions bil LE   See Function Navigator for Current Functional Status.   Therapy/Group: Individual Therapy  Luberta Mutter 05/16/2017, 11:58 AM

## 2017-05-16 NOTE — Progress Notes (Signed)
PHYSICAL MEDICINE & REHABILITATION     PROGRESS NOTE    Subjective/Complaints: Slept much better last night. Appetite good. No new complaints.   ROS: pt denies nausea, vomiting, diarrhea, cough, shortness of breath or chest pain   Objective: Vital Signs: Blood pressure (!) 141/80, pulse 88, temperature 98 F (36.7 C), temperature source Oral, resp. rate 17, height 5\' 10"  (1.778 m), weight 83.9 kg (185 lb), SpO2 100 %. No results found. No results for input(s): WBC, HGB, HCT, PLT in the last 72 hours. No results for input(s): NA, K, CL, GLUCOSE, BUN, CREATININE, CALCIUM in the last 72 hours.  Invalid input(s): CO CBG (last 3)  Recent Labs    05/15/17 1716 05/15/17 2123 05/16/17 0638  GLUCAP 103* 112* 128*    Wt Readings from Last 3 Encounters:  05/10/17 83.9 kg (185 lb)  03/23/17 74.4 kg (164 lb 0.4 oz)    Physical Exam:  Constitutional: NAD. Vital signs reviewed. HENT: Normocephalicand atraumatic.  Eyes: EOMare normal. No discharge.  Cardiovascular: RRR without murmur. No JVD   Respiratory: CTA Bilaterally without wheezes or rales. Normal effort  GI: Bowel sounds positive, abdomen nontender.  PEG site closed Skin. Warm and dry, intact Musculoskeletal. No edema. No tenderness.  Genitourinary.  + Suprapubic cath in place and draining, urine   cloudy Neurological. Much more alert.  Follows simple commands.   B/l UE Bilateral 3+/5 Deltoid, biceps, triceps and 3-4/5 HI.  B/l LE: 2/5 HF, KE,3-/5  ADF,APF--  Generalized muscle wasting throughout Psych: affect more dynamic, more engaging   Assessment/Plan: 1. 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 Physiatrist is providing close team supervision and 24 hour management of active medical problems listed below. Physiatrist and rehab team continue to  assess barriers to discharge/monitor patient progress toward functional and medical goals.  Function:  Bathing Bathing position Bathing activity did not occur: N/A(night bath) Position: Bed  Bathing parts Body parts bathed by patient: Chest, Abdomen Body parts bathed by helper: Right arm, Right lower leg, Left arm, Left lower leg, Chest, Abdomen, Back, Front perineal area, Buttocks, Right upper leg, Left upper leg  Bathing assist Assist Level: Touching or steadying assistance(Pt > 75%)      Upper Body Dressing/Undressing Upper body dressing   What is the patient wearing?: Pull over shirt/dress     Pull over shirt/dress - Perfomed by patient: Thread/unthread right sleeve Pull over shirt/dress - Perfomed by helper: Thread/unthread left sleeve, Put head through opening, Pull shirt over trunk        Upper body assist Assist Level: Touching or steadying assistance(Pt > 75%)      Lower Body Dressing/Undressing Lower body dressing   What is the patient wearing?: Pants, Non-skid slipper socks, Ted Hose       Pants- Performed by helper: Thread/unthread right pants leg, Thread/unthread left pants leg, Pull pants up/down Non-skid slipper socks- Performed by patient: Don/doff right sock, Don/doff left sock Non-skid slipper socks- Performed by helper: Don/doff right sock, Don/doff left sock               TED Hose - Performed by helper: Don/doff right TED hose, Don/doff left TED hose  Lower body assist Assist for lower body dressing: Touching or steadying assistance (Pt > 75%)      Toileting Toileting Toileting activity did not occur: No continent bowel/bladder event Toileting steps completed by patient: Adjust clothing  prior to toileting, Adjust clothing after toileting Toileting steps completed by helper: Adjust clothing prior to toileting, Performs perineal hygiene, Adjust clothing after toileting    Toileting assist Assist level: Touching or steadying assistance (Pt.75%), Set  up/obtain supplies   Transfers Chair/bed transfer   Chair/bed transfer method: Stand pivot Chair/bed transfer assist level: Moderate assist (Pt 50 - 74%/lift or lower) Chair/bed transfer assistive device: Environmental manager lift: Architectural technologist activity did not occur: Safety/medical concerns   Max distance: 20 Assist level: 2 helpers   Wheelchair   Type: Manual Max wheelchair distance: 100 Assist Level: Supervision or verbal cues  Cognition Comprehension Comprehension assist level: Follows complex conversation/direction with no assist  Expression Expression assist level: Expresses complex ideas: With no assist  Social Interaction Social Interaction assist level: Interacts appropriately with others with medication or extra time (anti-anxiety, antidepressant).  Problem Solving Problem solving assist level: Solves complex problems: With extra time  Memory Memory assist level: More than reasonable amount of time   Medical Problem List and Plan: 1. 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    -team conf today 2. DVT Prophylaxis/Anticoagulation: Subcutaneous Lovenox.  3. Pain Management: Oxycodone 5 mg every 6 hours as needed -fair control 4. Mood:   trazodone 50 mg daily at bedtime, Seroquel  daily at bedtime, Zoloft 50 mg daily, amantadine 100 mg daily, Provigil 100 mg daily   -improved overall  -klonopin stopped   -Decreased Seroquel to 50 mg.  Already seeing benefits of decreased sedatives.     - decrease provigil to 50mg  qam.  Dc prior to discharge 5. Neuropsych: This patient iscapable of making decisions on hisown behalf. 6. Skin/Wound Care: Routine skin checks  - continue air mattress due to sensitive skin 7. Fluids/Electrolytes/Nutrition: Routine I&Os  -replacing k+       8.Nondisplaced  fractures of right T7 transverse process. Conservative care 9.Multiple pelvic fractures/bilateral sacroiliac joints and suprapubic symphysis as well as left pelvic sidewall hematoma. Status post sacroiliac screw fixation with external fixation pelvis anterior pelvic ring closed reduction 02/07/2017. May ambulate extended distances weightbearing as tolerated without restrictions. 10.Seizure prophylaxis. Keppra 500 mg every 12 hours. EEG negative 11.Tracheostomy tube 03/01/2017 per Dr. Hulen Skains. Decannulated. 12.GI: Gastrostomy tube 03/01/2017 per Dr. Hulen Skains.  .   -Removed PEG without issues    Tolerating regular diet  13.Urinary retention/urethral injury. Status post suprapubic catheter placement 03/21/2017 per interventional radiology. Follow-up urology services Dr. Nani Gasser reached out to him re: plan of care prior to discharge  -[facility]  changed cath out 04/27/2017  -.urine again positive for 100,000 Proteus.   Bactrim: continue for 10-day course given recurrence 14.Acute blood loss anemia. Continue iron supplement   Hemoglobin up to 10.0      15. DM 2:   -  added carb modified restriction  -amaryl 2mg  resumed with improvement   CBG (last 3)  Recent Labs    05/15/17 1716 05/15/17 2123 05/16/17 0638  GLUCAP 103* 112* 128*    -Good control at present 16. Tachycardia   -due to deconditioning   -metoprolol increased on 11/18  -Improved control  17. Elevated blood pressure   metoprolol increased to 50 on 11/18  -better control overall  LOS (Days) 31 A FACE TO FACE EVALUATION WAS PERFORMED  [name], MD 05/16/2017 8:20 AM

## 2017-05-16 NOTE — Progress Notes (Signed)
Occupational Therapy Session Note  Patient Details  Name: Peter [name] MRN: [id number] Date of Birth: [birth year]  Today's Date: 05/16/2017 OT Individual Time: 0800-0900 OT Individual Time Calculation (min): 60 min    Short Term Goals: Week 4:  OT Short Term Goal 1 (Week 4): STG=LTG secondary to ELOS  Skilled Therapeutic Interventions/Progress Updates:    Pt resting in bed upon arrival with wife present.  Pt incontinent of bowel and required tot A for hygiene and changing brief.  Pt required assistance threading pants but was able to pull over hips with min A with rolling in bed.  Pt required min A for supine>sit EOB in preparation for slide board transfer to w/c.  Pt directed wife with board placement and transfer to w/c.  Pt completed grooming tasks at sink with focus on BUE use for shaving tasks and brushing hair.  Pt completed shaving task without assistance.  Pt and wife pleased with progress and looking forward to d/c on 11/30.  Pt remained in w/c with all needs within reach.   Therapy Documentation Precautions:  Precautions Precautions: Fall Precaution Comments: trach, PEG, catheter  Required Braces or Orthoses: Other Brace/Splint Other Brace/Splint: bil PRAFO, abdominal binder, bil hand splints at night Restrictions Weight Bearing Restrictions: Yes RLE Weight Bearing: Weight bearing as tolerated LLE Weight Bearing: Weight bearing as tolerated Other Position/Activity Restrictions: no ROM restrictions bil LE  Pain:  Pt denies pain  See Function Navigator for Current Functional Status.   Therapy/Group: Individual Therapy  [name] 05/16/2017, 9:05 AM

## 2017-05-16 NOTE — Progress Notes (Signed)
Occupational Therapy Note  Patient Details  Name: Peter [name] MRN: [id number] Date of Birth: April 10, 1961  Today's Date: 05/16/2017 OT Individual Time: 1130-1200 OT Individual Time Calculation (min): 30 min   Pt denies pain Individual therapy  Focus on drop arm BSC transfer with slide board and family education.  Pt transferred with min A.  Pt's wife present and practiced transfer.  Demonstrated clothing management.  Pt transferred back to w/c and remained in w/c with all needs within reach.    [name] [facility] 05/16/2017, 12:13 PM

## 2017-05-17 ENCOUNTER — Inpatient Hospital Stay (HOSPITAL_COMMUNITY): Payer: BLUE CROSS/BLUE SHIELD

## 2017-05-17 LAB — GLUCOSE, CAPILLARY
GLUCOSE-CAPILLARY: 89 mg/dL (ref 65–99)
GLUCOSE-CAPILLARY: 124 mg/dL — AB (ref 65–99)
Glucose-Capillary: 106 mg/dL — ABNORMAL HIGH (ref 65–99)
Glucose-Capillary: 114 mg/dL — ABNORMAL HIGH (ref 65–99)

## 2017-05-17 NOTE — Progress Notes (Addendum)
Physical Therapy Note  Patient Details  Name: Peter [name] MRN: [id number] Date of Birth: 1961/05/05 Today's Date: 05/17/2017  1030-1200, 90 min individual tx Pain: none per pt  dtr Claiborne Billings present for majority of session.   W/c propulsion using bil UEs over level tile and carpet with cues for improved efficiency.  Instructed Kelly in retrograde massage of R foot to address edema.   Stand pivot w/c > Kinetron to R, using hand grips on machine, mod assist.  Stand pivot Kinetron > w/c to L, fatigued, +2.   Neuromuscular re-education via forced use, multimodal cues and strap around thighs for alternating reciprocal movement x bil LEs on Kinetron x 10 cycles c 3. Seated 10 x 2 bil hip abd/adduction focusing on L hip control.  Bil hand use to roll quick release belt in small roll, with extra time.  In R side lying, 10 x 1 L hip abduction- clam shells, focusing on eccentric control.    Manual therapy with grade 2-3 glides for L ankle DF , followed by further movement at level 70, seated on Kinetron focusing on knee extension to lower heels to foot plates.  Seated HR 102/68; HR 89.    Standing on Kinetron at level 90, for alternating reciprocal movement x bil LEs.  Pt tolerated stepping x 3 steps, x 6 steps with seated rest breaks. .  Sustained standing x 10 seconds, x 20 seconds,  biased to L for L hamstring and heel cord stretch. L heel flat by end of Kinetron use.  Duration limited by wooziness, with seated rest breaks with feet elevated for bil hamstring stretch.  Slide board transfer to R w/c> mat, level, with min assist after set-up.  Sustained stretch x 7 minutes  bil hip external rotators using weights in supine (with wedge to elevate trunk and head).  PT replaced Ulice Dash cushion with gel insert for standard contoured cushion, per pt request.  Pt left resting in w/c, seated at table for Patient [employer].     See function navigator for current  status.   [name] 05/17/2017, 8:00 AM

## 2017-05-17 NOTE — Progress Notes (Signed)
Occupational Therapy Session Note  Patient Details  Name: Peter [name] MRN: [id number] Date of Birth: 1960-06-24  Today's Date: 05/17/2017 OT Individual Time: 0800-0900 OT Individual Time Calculation (min): 60 min    Short Term Goals: Week 4:  OT Short Term Goal 1 (Week 4): STG=LTG secondary to ELOS  Skilled Therapeutic Interventions/Progress Updates:    Initial focus on self feeding and opening containers.  Pt continues to exhibit decreased B hand/grasp strength but is able to complete opening containers after initially started.  Pt required min A for sidelying>sitting EOB in preparation for stand pivot transfer.  Pt required mod A for stand pivot transfer to w/c. Pt completed grooming tasks while seated in w/c at sink.  Pt continues to limited BUE/hand grasp but is able to use BUE in functional setting.  Pt remained in w/c with all needs within reach.   Therapy Documentation Precautions:  Precautions Precautions: Fall Precaution Comments: trach, PEG, catheter  Required Braces or Orthoses: Other Brace/Splint Other Brace/Splint: bil PRAFO, abdominal binder, bil hand splints at night Restrictions Weight Bearing Restrictions: Yes RLE Weight Bearing: Weight bearing as tolerated LLE Weight Bearing: Weight bearing as tolerated Other Position/Activity Restrictions: no ROM restrictions bil LE  Pain:  Pt denies pain  See Function Navigator for Current Functional Status.   Therapy/Group: Individual Therapy  [name] 05/17/2017, 9:47 AM

## 2017-05-17 NOTE — Progress Notes (Signed)
[place] PHYSICAL MEDICINE & REHABILITATION     PROGRESS NOTE    Subjective/Complaints: No issues over night. Tolerating med tapers without issue  ROS: pt denies nausea, vomiting, diarrhea, cough, shortness of breath or chest pain     Objective: Vital Signs: Blood pressure (!) 155/92, pulse 91, temperature (!) 97.5 F (36.4 C), temperature source Oral, resp. rate 18, height 5\' 10"  (1.778 m), weight 83.9 kg (185 lb), SpO2 97 %. No results found. No results for input(s): WBC, HGB, HCT, PLT in the last 72 hours. No results for input(s): NA, K, CL, GLUCOSE, BUN, CREATININE, CALCIUM in the last 72 hours.  Invalid input(s): CO CBG (last 3)  Recent Labs    05/16/17 1633 05/16/17 2116 05/17/17 0634  GLUCAP 116* 140* 106*    Wt Readings from Last 3 Encounters:  05/10/17 83.9 kg (185 lb)  03/23/17 74.4 kg (164 lb 0.4 oz)    Physical Exam:  Constitutional: NAD. Vital signs reviewed. HENT: Normocephalicand atraumatic.  Eyes: EOMare normal. No discharge.  Cardiovascular: RRR without murmur. No JVD    Respiratory: CTA Bilaterally without wheezes or rales. Normal effort  GI: Bowel sounds positive, abdomen nontender.  PEG site closed/healed Skin. Warm and dry, intact Musculoskeletal. No edema. No tenderness.  Genitourinary.  + Suprapubic cath in place and draining, urine clear Neurological. Much more alert.  Follows simple commands.   B/l UE Bilateral 3+/5 Deltoid, biceps, triceps and 3-4/5 HI.  B/l LE: 2/5 HF, KE,3-/5  ADF,APF--  No motor changes Psych: alert and engtaging  Assessment/Plan: 1. 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 Physiatrist is providing close team supervision and 24 hour management of active medical problems listed below. Physiatrist and rehab team continue to assess barriers to  discharge/monitor patient progress toward functional and medical goals.  Function:  Bathing Bathing position Bathing activity did not occur: N/A(night bath) Position: Bed  Bathing parts Body parts bathed by patient: Chest, Abdomen Body parts bathed by helper: Right arm, Right lower leg, Left arm, Left lower leg, Chest, Abdomen, Back, Front perineal area, Buttocks, Right upper leg, Left upper leg  Bathing assist Assist Level: Touching or steadying assistance(Pt > 75%)      Upper Body Dressing/Undressing Upper body dressing   What is the patient wearing?: Pull over shirt/dress     Pull over shirt/dress - Perfomed by patient: Thread/unthread right sleeve Pull over shirt/dress - Perfomed by helper: Thread/unthread left sleeve, Put head through opening, Pull shirt over trunk        Upper body assist Assist Level: Touching or steadying assistance(Pt > 75%)      Lower Body Dressing/Undressing Lower body dressing   What is the patient wearing?: Pants, Non-skid slipper socks, Ted Hose       Pants- Performed by helper: Thread/unthread right pants leg, Thread/unthread left pants leg, Pull pants up/down Non-skid slipper socks- Performed by patient: Don/doff right sock, Don/doff left sock Non-skid slipper socks- Performed by helper: Don/doff right sock, Don/doff left sock               TED Hose - Performed by helper: Don/doff right TED hose, Don/doff left TED hose  Lower body assist Assist for lower body dressing: Touching or steadying assistance (Pt > 75%)      Toileting Toileting Toileting activity did not occur: No continent bowel/bladder event Toileting steps completed by patient: Adjust clothing prior to toileting, Adjust  clothing after toileting Toileting steps completed by helper: Adjust clothing prior to toileting, Performs perineal hygiene, Adjust clothing after toileting    Toileting assist Assist level: Touching or steadying assistance (Pt.75%), Set up/obtain  supplies   Transfers Chair/bed transfer   Chair/bed transfer method: Stand pivot Chair/bed transfer assist level: Moderate assist (Pt 50 - 74%/lift or lower) Chair/bed transfer assistive device: Walker Mechanical lift: Ecologist Ambulation activity did not occur: Safety/medical concerns   Max distance: 15 Assist level: Moderate assist (Pt 50 - 74%)   Wheelchair   Type: Manual Max wheelchair distance: 100 Assist Level: Supervision or verbal cues  Cognition Comprehension Comprehension assist level: Follows complex conversation/direction with no assist  Expression Expression assist level: Expresses complex ideas: With no assist  Social Interaction Social Interaction assist level: Interacts appropriately with others with medication or extra time (anti-anxiety, antidepressant).  Problem Solving Problem solving assist level: Solves complex problems: With extra time  Memory Memory assist level: More than reasonable amount of time   Medical Problem List and Plan: 1. 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 Continued gradual gains 2. DVT Prophylaxis/Anticoagulation: Subcutaneous Lovenox.  3. Pain Management: Oxycodone 5 mg every 6 hours as needed -fair control 4. Mood:   trazodone 50 mg daily at bedtime,   Zoloft 50 mg daily, amantadine 100 mg daily,     -improved overall  -klonopin stopped   -stopped seroquel and provigil today  -continue on trazodone, consider stopping amantadine too before going home 5. Neuropsych: This patient iscapable of making decisions on hisown behalf. 6. Skin/Wound Care: Routine skin checks  - continue air mattress due to sensitive skin 7. Fluids/Electrolytes/Nutrition: Routine I&Os  -replacing k+       8.Nondisplaced fractures of right T7 transverse process. Conservative care 9.Multiple pelvic  fractures/bilateral sacroiliac joints and suprapubic symphysis as well as left pelvic sidewall hematoma. Status post sacroiliac screw fixation with external fixation pelvis anterior pelvic ring closed reduction 02/07/2017. May ambulate extended distances weightbearing as tolerated without restrictions. 10.Seizure prophylaxis. Keppra 500 mg every 12 hours. EEG negative 11.Tracheostomy tube 03/01/2017 per Dr. Hulen Skains. Decannulated. 12.GI: Gastrostomy tube 03/01/2017 per Dr. Hulen Skains.  .   -Removed PEG without issues    Tolerating regular diet  13.Urinary retention/urethral injury. Status post suprapubic catheter placement 03/21/2017 per interventional radiology. Follow-up urology services Dr. Pamella Pert word re: plan of care prior to discharge  -[place]  changed cath out 04/27/2017  -.urine again positive for 100,000 Proteus.   Bactrim: continue for 10-day course given recurrence 14.Acute blood loss anemia. Continue iron supplement   Hemoglobin up to 10.0      15. DM 2:   -  continue carb modified restriction  -amaryl 2mg  resumed with improvement   CBG (last 3)  Recent Labs    05/16/17 1633 05/16/17 2116 05/17/17 0634  GLUCAP 116* 140* 106*    -Good control at present 16. Tachycardia   -due to deconditioning   -metoprolol increased on 11/18  -Improved control  17. Elevated blood pressure   metoprolol increased to 50 on 11/18  -better control overall  LOS (Days) 32 A FACE TO FACE EVALUATION WAS PERFORMED  [name], MD 05/17/2017 8:27 AM

## 2017-05-17 NOTE — Progress Notes (Signed)
Spoke with Caryl Pina nurse of urology Dr. Louis Meckel in regards to plan of care for suprapubic tube. Current plan is patient will follow up with Dr. Link Snuffer of urology services as an outpatient for ongoing care suprapubic tube. They are to call me back later today if any change in current plan

## 2017-05-17 NOTE — Progress Notes (Signed)
Occupational Therapy Note  Patient Details  Name: Peter [name] MRN: [id number] Date of Birth: [birth year]  Today's Date: 05/17/2017 OT Individual Time: 1300-1340 OT Individual Time Calculation (min): 40 min   Pt denies pain Individual Therapy  Pt propelled w/c to day room and engaged in [facility] tasks sorting playing cards.  Pt more proficient with tasks using LUE/hand but incorporates RUE as gross assist.  Pt propelled w/c back to room and performed slide board transfer to bed with min A.  Pt remained in bed with all needs within reach.  Pt requested his weights so he could do BUE exercises while resting in bed.    [name] [facility] 05/17/2017, 2:47 PM

## 2017-05-18 ENCOUNTER — Inpatient Hospital Stay (HOSPITAL_COMMUNITY): Payer: BLUE CROSS/BLUE SHIELD | Admitting: Physical Therapy

## 2017-05-18 ENCOUNTER — Inpatient Hospital Stay (HOSPITAL_COMMUNITY): Payer: BLUE CROSS/BLUE SHIELD

## 2017-05-18 LAB — GLUCOSE, CAPILLARY
GLUCOSE-CAPILLARY: 227 mg/dL — AB (ref 65–99)
GLUCOSE-CAPILLARY: 80 mg/dL (ref 65–99)
GLUCOSE-CAPILLARY: 132 mg/dL — AB (ref 65–99)
Glucose-Capillary: 116 mg/dL — ABNORMAL HIGH (ref 65–99)

## 2017-05-18 NOTE — Progress Notes (Signed)
Social Work Patient ID: Peter [name], male   DOB: 1960-07-30, 56 y.o.   MRN: [id number]   Have reviewed conference with pt and wife.  Both preparing for d/c tomorrow.  Discussion of HH and DME to be arranged to go to pt's in-law's home.  No further concerns at this time.  [name], LCSW

## 2017-05-18 NOTE — Progress Notes (Signed)
Occupational Therapy Note  Patient Details  Name: Peter [name] MRN: [id number] Date of Birth: 01-23-1961  Today's Date: 05/18/2017 OT Individual Time: 1000-1030 OT Individual Time Calculation (min): 30 min   Pt denies pain Individual Therapy  OT intervention with focus on bed mobility, functional transfers, grooming tasks, continued discharge planning, and table activities with focus on BUE [name]. Pt is able to move BLE off edge of bed and requires min A for initiating sidelying>sit EOB in preparation for slide board transfer to w/c. Pt remained in w/c with all needs within reach.    [name] [name] 05/18/2017, 10:39 AM

## 2017-05-18 NOTE — Patient Care Conference (Signed)
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 Elevated blood pressure affecting pregnancy in first trimester, antepartum   . PEG (percutaneous endoscopic gastrostomy) status ([place])   . Suprapubic catheter ([place])   . Tachycardia   . Closed fracture of seventh thoracic vertebra ([place])   . 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 Chest trauma   . Fracture   . Multiple rib fractures involving four or more ribs   . Pelvic ring fracture, sequela   . Respiratory failure ([place])   . 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[place])   . Post-operative pain   . Acute blood loss anemia   . Diabetes mellitus type 2 in nonobese ([facility])   . Hyperlipidemia   . Tachypnea   . Secondary hypertension   . Hyperkalemia   . Pressure injury of skin 02/28/2017  . Multiple closed anterior-posterior compression fractures of pelvis ([place]) 02/08/2017  . Multiple fractures of ribs, bilateral, initial encounter for closed fracture 02/07/2017    Expected Discharge Date: Expected Discharge Date: 05/19/17  Team Members Present: Physician leading conference: Dr. Alger Simons Social Worker Present: Lennart Pall, LCSW Nurse Present: Dorien Chihu[place]ua,  RN PT Present: Kem Parkinson, PT OT Present: Roanna Epley, [place], OT PPS Coordinator present : Daiva Nakayama, RN, CRRN     Current Status/Progress Goal Weekly Team Focus  Medical   PEG is out.  Working on weaning medications for sleep and arousal.  Nutrition is good.  He did have a recurrent UTI  Simplify medication regimen for discharge  See above   Bowel/Bladder   continent of bowel, LBM 11/26, suprapubic catheter, due to be changed 12/6, currently UTI, bactrim DS treatment  UTI resloved, maintain b/b status  monitor b/b q shift and prn   Swallow/Nutrition/ Hydration             ADL's   UB dsg-min A; LB dsg-max A; bathing-mod A; slide board transfers-min A  LTG changed 11/23:bathing-mod A at bed level/w/c level; UB dsg-min A; LB dsg-max A (bed level); toilet transfers-mod A; toileting-max A  educaiton, functoinal transfers, BUE ROM/strengthening; ADL retraining   Mobility   minA bed mobility and transfers, min/modA sit <>stand, minA gait with [id number]'  min assist w/c level; supervision w/c mobility; home gait goal d/c; mod assist controlled environment gait goal  pt/family education and hands on training, LE strengthening/coordination, transfer and gait training   Communication             Safety/Cognition/ Behavioral Observations            Pain   pain is not an issue  Skin   abrasions to right fa, left and right groin, all with foam dressings,  penile abrasion healed  continued wound healing, no s/s of infection, no breakdown  assess skin q shift and prn    Rehab Goals Patient on target to meet rehab goals: Yes *See Care Plan and progress notes for long and short-term goals.     Barriers to Discharge  Current Status/Progress Possible Resolutions Date Resolved   Physician    Medical stability;Wound Care        Continue management as outlined by chart      Nursing                  PT                    OT                  SLP                 SW                Discharge Planning/Teaching Needs:  Plan for d/c home with wife and family to provide 24/7 assistance.  Teaching has been ongoing with wife   Team Discussion:  Peg out;  Still await input from urology about plans for catheter. Continue to monitor BSs.  Min assist with transfers, ADLs.  Ambulated short distance, however, do not anticipate functional ambulator at home.  Grip strength improved.  Ready for d/c end of week.  Revisions to Treatment Plan:  None    Continued Need for Acute Rehabilitation Level of Care: The patient requires daily medical management by a physician with specialized training in physical medicine and rehabilitation for the following conditions: Daily direction of a multidisciplinary physical rehabilitation program to ensure safe treatment while eliciting the highest outcome that is of practical value to the patient.: Yes Daily medical management of patient stability for increased activity during participation in an intensive rehabilitation regime.: Yes Daily analysis of laboratory values and/or radiology reports with any subsequent need for medication adjustment of medical intervention for : Mood/behavior problems;Urological problems  [place] Stringfield 05/18/2017, 10:08 AM
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
## 2017-05-18 NOTE — Discharge Instructions (Signed)
Inpatient Rehab Discharge Instructions  Peter [name] Discharge date and time: No discharge date for patient encounter.   Activities/Precautions/ Functional Status: Activity: activity as tolerated Diet: diabetic diet Wound Care: keep wound clean and dry Functional status:  ___ No restrictions     ___ Walk up steps independently ___ 24/7 supervision/assistance   ___ Walk up steps with assistance ___ Intermittent supervision/assistance  ___ Bathe/dress independently ___ Walk with walker     _x__ Bathe/dress with assistance ___ Walk Independently    ___ Shower independently ___ Walk with assistance    ___ Shower with assistance ___ No alcohol     ___ Return to work/school ________    COMMUNITY REFERRALS UPON DISCHARGE:    Home Health:   PT     OT     RN                        Agency:  [facility] Phone:  [phone]    Medical Equipment/Items Ordered:  Hospital bed, commode and transfer board                                                       Agency/Supplier:  [facility] @ [phone]      Special Instructions: Routine change of suprapubic tube 05/24/2017  Routine care suprapubic tube with follow-up Dr. Link Snuffer urology services [phone]  Foam dressing right groin was soft silicone foam change every 3 days as needed   My questions have been answered and I understand these instructions. I will adhere to these goals and the provided educational materials after my discharge from the hospital.  Patient/Caregiver Signature _______________________________ Date __________  Clinician Signature _______________________________________ Date __________  Please bring this form and your medication list with you to all your follow-up doctor's appointments.

## 2017-05-18 NOTE — Progress Notes (Addendum)
[facility]     PROGRESS NOTE    Subjective/Complaints: Slept well. No new complaints.   ROS: pt denies nausea, vomiting, diarrhea, cough, shortness of breath or chest pain    Objective: Vital Signs: Blood pressure 120/73, pulse 82, temperature 97.8 F (36.6 C), temperature source Oral, resp. rate 18, height 5\' 10"  (1.778 m), weight 83.9 kg (185 lb), SpO2 99 %. No results found. No results for input(s): WBC, HGB, HCT, PLT in the last 72 hours. No results for input(s): NA, K, CL, GLUCOSE, BUN, CREATININE, CALCIUM in the last 72 hours.  Invalid input(s): CO CBG (last 3)  Recent Labs    05/17/17 1653 05/17/17 2123 05/18/17 0624  GLUCAP 89 124* 116*    Wt Readings from Last 3 Encounters:  05/10/17 83.9 kg (185 lb)  03/23/17 74.4 kg (164 lb 0.4 oz)    Physical Exam:  Constitutional: NAD. Vital signs reviewed. HENT: Normocephalicand atraumatic.  Eyes: EOMare normal. No discharge.  Cardiovascular: RRR without murmur. No JVD     Respiratory: CTA Bilaterally without wheezes or rales. Normal effort   GI: Bowel sounds positive, abdomen nontender.  PEG site closed/healed Skin. Warm and dry, intact Musculoskeletal. No edema. No tenderness.  Genitourinary.  + Suprapubic cath in place and draining, urine clear Neurological. Much more alert.  Follows simple commands.   B/l UE Bilateral 3+/5 Deltoid, biceps, triceps and 3-4/5 HI.  B/l LE: 2/5 HF, KE,3-/5  ADF,APF--  No motor changes Psych: alert and engtaging  Assessment/Plan: 1. 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 Physiatrist is providing close team supervision and 24 hour management of active medical problems listed below. Physiatrist and rehab team continue to assess barriers to discharge/monitor patient progress toward functional  and medical goals.  Function:  Bathing Bathing position Bathing activity did not occur: N/A(night bath) Position: Bed  Bathing parts Body parts bathed by patient: Chest, Abdomen Body parts bathed by helper: Right arm, Right lower leg, Left arm, Left lower leg, Chest, Abdomen, Back, Front perineal area, Buttocks, Right upper leg, Left upper leg  Bathing assist Assist Level: Touching or steadying assistance(Pt > 75%)      Upper Body Dressing/Undressing Upper body dressing   What is the patient wearing?: Pull over shirt/dress     Pull over shirt/dress - Perfomed by patient: Thread/unthread right sleeve Pull over shirt/dress - Perfomed by helper: Thread/unthread left sleeve, Put head through opening, Pull shirt over trunk        Upper body assist Assist Level: Touching or steadying assistance(Pt > 75%)      Lower Body Dressing/Undressing Lower body dressing   What is the patient wearing?: Pants, Non-skid slipper socks, Ted Hose       Pants- Performed by helper: Thread/unthread right pants leg, Thread/unthread left pants leg, Pull pants up/down Non-skid slipper socks- Performed by patient: Don/doff right sock, Don/doff left sock Non-skid slipper socks- Performed by helper: Don/doff right sock, Don/doff left sock               TED Hose - Performed by helper: Don/doff right TED hose, Don/doff left TED hose  Lower body assist Assist for lower body dressing: Touching or steadying assistance (Pt > 75%)      Toileting Toileting Toileting activity did not occur: No continent bowel/bladder event Toileting steps completed by patient: Adjust clothing prior to toileting, Adjust clothing after toileting Toileting  steps completed by helper: Adjust clothing prior to toileting, Performs perineal hygiene, Adjust clothing after toileting    Toileting assist Assist level: Touching or steadying assistance (Pt.75%), Set up/obtain supplies   Transfers Chair/bed transfer   Chair/bed  transfer method: Stand pivot Chair/bed transfer assist level: Moderate assist (Pt 50 - 74%/lift or lower) Chair/bed transfer assistive device: Walker Mechanical lift: Ecologist Ambulation activity did not occur: Safety/medical concerns   Max distance: 15 Assist level: Moderate assist (Pt 50 - 74%)   Wheelchair   Type: Manual Max wheelchair distance: 100 Assist Level: Supervision or verbal cues  Cognition Comprehension Comprehension assist level: Follows complex conversation/direction with no assist  Expression Expression assist level: Expresses complex ideas: With no assist  Social Interaction Social Interaction assist level: Interacts appropriately with others with medication or extra time (anti-anxiety, antidepressant).  Problem Solving Problem solving assist level: Solves complex problems: With extra time  Memory Memory assist level: More than reasonable amount of time   Medical Problem List and Plan: 1. 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   -dc tomorrow 2. DVT Prophylaxis/Anticoagulation: Subcutaneous Lovenox.  3. Pain Management: Oxycodone 5 mg every 6 hours as needed -fair control 4. Mood:   trazodone 50 mg daily at bedtime,   Zoloft 50 mg daily, amantadine 100 mg daily,     -improved overall  -klonopin stopped   -stopped seroquel and provigil today  -continue on trazodone  -reduce amantadine to 50mg  today then stop 5. Neuropsych: This patient iscapable of making decisions on hisown behalf. 6. Skin/Wound Care: Routine skin checks  - continue air mattress due to sensitive skin 7. Fluids/Electrolytes/Nutrition: Routine I&Os  -replacing k+       8.Nondisplaced fractures of right T7 transverse process. Conservative care 9.Multiple pelvic fractures/bilateral sacroiliac joints and suprapubic symphysis as well as  left pelvic sidewall hematoma. Status post sacroiliac screw fixation with external fixation pelvis anterior pelvic ring closed reduction 02/07/2017. May ambulate extended distances weightbearing as tolerated without restrictions. 10.Seizure prophylaxis. Keppra 500 mg every 12 hours. EEG negative 11.Tracheostomy tube 03/01/2017 per Dr. Hulen Skains. Decannulated. 12.GI: Gastrostomy tube 03/01/2017 per Dr. Hulen Skains.  .   -Removed PEG without issues    Tolerating regular diet  13.Urinary retention/urethral injury. Status post suprapubic catheter placement 03/21/2017 per interventional radiology. Follow-up urology services as outpt  -SPC  changed cath out 04/27/2017  -.urine again positive for 100,000 Proteus.   Bactrim: continue for 10-day course given recurrence 14.Acute blood loss anemia. Continue iron supplement   Hemoglobin up to 10.0      15. DM 2:   -  continue carb modified restriction  -amaryl 2mg  resumed with improvement   CBG (last 3)  Recent Labs    05/17/17 1653 05/17/17 2123 05/18/17 0624  GLUCAP 89 124* 116*    -Good control 11/29 16. Tachycardia   -due to deconditioning   -metoprolol increased on 11/18  -Improved control  17. Elevated blood pressure   metoprolol increased to 50 on 11/18  -better control overall  LOS (Days) 33 A FACE TO FACE EVALUATION WAS PERFORMED  [name], MD 05/18/2017 8:12 AM

## 2017-05-18 NOTE — Discharge Summary (Signed)
Discharge summary job [id number]

## 2017-05-18 NOTE — Progress Notes (Signed)
Physical Therapy Discharge Summary  Patient Details  Name: Peter [name] MRN: [id number] Date of Birth: 1961/02/23  Today's Date: 05/18/2017 PT Individual Time: 1100-1200 PT Individual Time Calculation (min): 60 min    Patient has met 5 of 7 long term goals due to improved activity tolerance, improved balance, improved postural control, increased strength, increased range of motion, decreased pain, ability to compensate for deficits, functional use of  right upper extremity, right lower extremity, left upper extremity and left lower extremity and improved coordination.  Patient to discharge at a wheelchair level [place].   Patient's care partner is independent to provide the necessary physical assistance at discharge.  Reasons goals not met: Requires minA for bed mobility, gait in controlled environment goal unmet d/t strength/endurance impairments. Pt ambulated at most 15' with modA; unable to ambulate >5' this day d/t fatigue.  Recommendation:  Patient will benefit from ongoing skilled PT services in home health setting to continue to advance safe functional mobility, address ongoing impairments in strength, ROM, coordination, activity tolerance, and minimize fall risk.  Equipment: transfer board, hospital bed  Reasons for discharge: treatment goals met and discharge from hospital  Patient/family agrees with progress made and goals achieved: Yes  PT Discharge Precautions/Restrictions Precautions Precautions: Fall Precaution Comments: suprapubic catheter Other Brace/Splint: bil PRAFOs as tolerated at night Restrictions RLE Weight Bearing: Weight bearing as tolerated LLE Weight Bearing: Weight bearing as tolerated Pain Pain Assessment Pain Assessment: No/denies pain Vision/Perception  Perception Perception: Within Functional Limits Praxis Praxis: Intact  Cognition Overall Cognitive Status: Within Functional Limits for tasks assessed Arousal/Alertness:  Awake/alert Orientation Level: Oriented X4 Sustained Attention: Appears intact Selective Attention: Appears intact Memory: Impaired Memory Impairment: Decreased recall of new information Awareness: Appears intact Problem Solving: Appears intact Safety/Judgment: Appears intact Sensation Sensation Light Touch: Appears Intact Stereognosis: Not tested Hot/Cold: Not tested Proprioception: Appears Intact Coordination Coordination and Movement Description: incoordination LLE noted during functional tasks, ataxic foot placement during gait Heel Shin Test: impaired d/t strength deficits Motor  Motor Motor: Abnormal tone Motor - Discharge Observations: hypertonicity BLEs, generalized weakness  Mobility Bed Mobility Bed Mobility: Rolling Right;Rolling Left Rolling Right: 5: Supervision Rolling Left: 5: Supervision Supine to Sit: 4: Min guard;HOB flat Sit to Supine: 4: Min assist Sit to Supine - Details (indicate cue type and reason): assist for one LE management into bed Transfers Transfers: Yes Sit to Stand: 3: Mod assist;4: Min assist Sit to Stand Details: Verbal cues for technique;Verbal cues for precautions/safety Stand to Sit: 3: Mod assist;With upper extremity assist Stand to Sit Details (indicate cue type and reason): Verbal cues for technique;Verbal cues for precautions/safety Stand Pivot Transfers: 3: Mod assist Stand Pivot Transfer Details: Verbal cues for technique;Verbal cues for precautions/safety;Verbal cues for safe use of DME/AE;Tactile cues for posture;Tactile cues for sequencing;Tactile cues for weight shifting Stand Pivot Transfer Details (indicate cue type and reason): with RW Lateral/Scoot Transfers: 4: Min guard;With slide board;With armrests removed Locomotion  Ambulation Ambulation: Yes Ambulation/Gait Assistance: 4: Min assist Ambulation Distance (Feet): 5 Feet(has ambulated up to 15', limited to 5' on this day d/t fatigue) Assistive device: Rolling  walker Ambulation/Gait Assistance Details: Verbal cues for precautions/safety;Verbal cues for technique;Tactile cues for placement Ambulation/Gait Assistance Details: assist for placement and stance control LLE Gait Gait: Yes Gait Pattern: Impaired Gait Pattern: Poor foot clearance - left;Poor foot clearance - right;Left genu recurvatum;Right genu recurvatum;Decreased dorsiflexion - right;Decreased dorsiflexion - left;Lateral hip instability Gait velocity: significantly decreased Stairs / Additional Locomotion Stairs: No Ramp: Not  tested (comment) Curb: Not tested (comment) Architect: Yes Wheelchair Assistance: 5: Supervision Wheelchair Assistance Details: Verbal cues for technique;Verbal cues for Information systems manager: Both upper extremities Wheelchair Parts Management: Needs assistance Distance: 150'  Trunk/Postural Assessment  Cervical Assessment Cervical Assessment: Within Functional Limits Thoracic Assessment Thoracic Assessment: Within Functional Limits Lumbar Assessment Lumbar Assessment: Within Functional Limits Postural Control Protective Responses: delayed  Balance Static Sitting Balance Static Sitting - Level of Assistance: 6: Modified independent (Device/Increase time) Dynamic Sitting Balance Dynamic Sitting - Level of Assistance: 5: Stand by assistance Static Standing Balance Static Standing - Level of Assistance: 4: Min assist;5: Stand by assistance Dynamic Standing Balance Dynamic Standing - Level of Assistance: 3: Mod assist Extremity Assessment  RUE Assessment RUE Assessment: Exceptions to WFL(shoulder 2-/5, elbow 3+/5) LUE Assessment LUE Assessment: Exceptions to WFL(shoulder2-/5, elbow3+/5) RLE Assessment RLE Assessment: Exceptions to [facility] RLE PROM (degrees) RLE Overall PROM Comments: hamstring tightness, DF to neutral RLE Strength Right Hip Flexion: 3-/5 Right Hip Extension: 3/5 Right Knee Flexion:  4-/5 Right Knee Extension: 4-/5 Right Ankle Dorsiflexion: 2+/5 Right Ankle Plantar Flexion: 3/5 LLE Assessment LLE Assessment: Exceptions to WFL LLE PROM (degrees) Overall PROM Left Lower Extremity: (hamstring tightness, DF to neutral) LLE Strength Left Hip Flexion: 3-/5 Left Hip Extension: 3/5 Left Knee Flexion: 4-/5 Left Knee Extension: 4-/5 Left Ankle Dorsiflexion: 3-/5 Left Ankle Plantar Flexion: 3+/5  Skilled Therapeutic Intervention: Pt received seated in w/c, denies pain and agreeable to treatment. Assessed all mobility as described above; pt limited by fatigue throughout session with reports that he believes increased fatigue is due to change in medications prior to d/c. Provided pt with various handouts for transfers, orthostatic hypotension and ace wrapping LEs, bumping w/c up/down stairs, and UE strengthening program. Performed sit <>stand in stedy to change brief totalA. Remained seated in w/c at end of session, all needs in reach. Pt with no further questions or concerns regarding d/c at this time.    See Function Navigator for Current Functional Status.  Benjiman Core Tygielski 05/18/2017, 1:14 PM

## 2017-05-18 NOTE — Progress Notes (Signed)
Occupational Therapy Note  Patient Details  Name: Peter [name] MRN: [id number] Date of Birth: 10-24-1960  Today's Date: 05/18/2017 OT Individual Time: 1300-1345 OT Individual Time Calculation (min): 45 min   Pt denies pain Individual Therapy  Focus on BUE [facility] tasks with theraputty, small beads, and coins.  Pt removed and replaced small beads from theraputty.  Pt required to pick up small beads from table to place in theraputty.  Pt picked up variety of coins and attempted in hand manipulation (unsuccessful secondary to ROM limitations). Pt requested to return to bed and positioned w/c next to bed in preparation for slide board transfer to bed.  Pt performed transfer with steady A after placement of board.  Pt remained in bed performing BUE and BLE exercises.  All needs within reach.    [name] [facility] 05/18/2017, 2:47 PM

## 2017-05-18 NOTE — Progress Notes (Signed)
Occupational Therapy Session Note  Patient Details  Name: Peter [name] MRN: [id number] Date of Birth: 1960/10/22  Today's Date: 05/18/2017 OT Individual Time: 0800-0900 OT Individual Time Calculation (min): 60 min    Short Term Goals: Week 4:  OT Short Term Goal 1 (Week 4): STG=LTG secondary to ELOS  Skilled Therapeutic Interventions/Progress Updates:    OT intervention with focus on BADL retraining including bathing/dressing at bed level/EOB.  Pt demonstrates improved ability to initiate all bathing/dressing tasks but continues to require max A for LB dressing and min A for UB dressing tasks.  Pt is able to move BLE over edge of bed but requires min A for sidelying>sit EOB.  Pt requires assistance placing BLE onto bed from sitting position.  Pt pleased with progress and ready for discharge tomorrow.   Therapy Documentation Precautions:  Precautions Precautions: Fall Precaution Comments: trach, PEG, catheter  Required Braces or Orthoses: Other Brace/Splint Other Brace/Splint: bil PRAFO, abdominal binder, bil hand splints at night Restrictions Weight Bearing Restrictions: Yes RLE Weight Bearing: Weight bearing as tolerated LLE Weight Bearing: Weight bearing as tolerated Other Position/Activity Restrictions: no ROM restrictions bil LE Pain:  Pt denies pan    See Function Navigator for Current Functional Status.   Therapy/Group: Individual Therapy  [name] 05/18/2017, 10:36 AM

## 2017-05-19 LAB — GLUCOSE, CAPILLARY: Glucose-Capillary: 109 mg/dL — ABNORMAL HIGH (ref 65–99)

## 2017-05-19 MED ORDER — FAMOTIDINE 20 MG PO TABS: 20.0000 mg | ORAL_TABLET | Freq: Two times a day (BID) | ORAL | 0 refills | Status: DC

## 2017-05-19 MED ORDER — LEVETIRACETAM 500 MG PO TABS: 500.0000 mg | ORAL_TABLET | Freq: Two times a day (BID) | ORAL | 0 refills | Status: DC

## 2017-05-19 MED ORDER — SERTRALINE HCL 25 MG PO TABS: 75.0000 mg | ORAL_TABLET | Freq: Every day | ORAL | 0 refills | Status: DC

## 2017-05-19 MED ORDER — TRAZODONE HCL 50 MG PO TABS: 50.0000 mg | ORAL_TABLET | Freq: Every day | ORAL | 0 refills | Status: DC

## 2017-05-19 MED ORDER — FERROUS SULFATE 325 (65 FE) MG PO TABS: 325.0000 mg | ORAL_TABLET | Freq: Two times a day (BID) | ORAL | 3 refills | Status: DC

## 2017-05-19 MED ORDER — METOPROLOL TARTRATE 50 MG PO TABS: 50.0000 mg | ORAL_TABLET | Freq: Two times a day (BID) | ORAL | 0 refills | Status: DC

## 2017-05-19 MED ORDER — SULFAMETHOXAZOLE-TRIMETHOPRIM 800-160 MG PO TABS: 1.0000 | ORAL_TABLET | Freq: Two times a day (BID) | ORAL | 0 refills | Status: DC

## 2017-05-19 MED ORDER — GLIMEPIRIDE 2 MG PO TABS: 2.0000 mg | ORAL_TABLET | Freq: Every day | ORAL | 0 refills | Status: DC

## 2017-05-19 NOTE — Progress Notes (Signed)
Discharge to home accompanied by wife. Discharge info given to patient and wife by Alcide Goodness [facility] and equipment delivery verified by Lorre Nick SW. No questions noted. Margarito Liner

## 2017-05-19 NOTE — Progress Notes (Signed)
[place] PHYSICAL MEDICINE & REHABILITATION     PROGRESS NOTE    Subjective/Complaints: In good spirits.  Excited to go home today   ROS: pt denies nausea, vomiting, diarrhea, cough, shortness of breath or chest pain   objective: Vital Signs: Blood pressure 137/84, pulse 85, temperature 98.6 F (37 C), temperature source Oral, resp. rate 18, height 5\' 10"  (1.778 m), weight 83.9 kg (185 lb), SpO2 99 %. No results found. No results for input(s): WBC, HGB, HCT, PLT in the last 72 hours. No results for input(s): NA, K, CL, GLUCOSE, BUN, CREATININE, CALCIUM in the last 72 hours.  Invalid input(s): CO CBG (last 3)  Recent Labs    05/18/17 1635 05/18/17 2121 05/19/17 0626  GLUCAP 80 132* 109*    Wt Readings from Last 3 Encounters:  05/10/17 83.9 kg (185 lb)  03/23/17 74.4 kg (164 lb 0.4 oz)    Physical Exam:  Constitutional: NAD. Vital signs reviewed. HENT: Normocephalicand atraumatic.  Eyes: EOMare normal. No discharge.  Cardiovascular: RRR without murmur. No JVD      Respiratory:CTA Bilaterally without wheezes or rales. Normal effort    GI: Bowel sounds positive, abdomen nontender.  PEG site closed/healed Skin. Warm and dry, intact Musculoskeletal. No edema. No tenderness.  Genitourinary.  + Suprapubic cath in place and draining, urine clear Neurological. Much more alert.  Follows simple commands.   B/l UE Bilateral 3+/5 Deltoid, biceps, triceps and 3-4/5 HI.  B/l LE: 2 to 2+/5 HF, KE,3-/5  ADF,APF--  No motor changes Psych: alert and engtaging  Assessment/Plan: 1. 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 Physiatrist is providing close team supervision and 24 hour management of active medical problems listed below. Physiatrist and rehab team continue to assess barriers to discharge/monitor patient progress  toward functional and medical goals.  Function:  Bathing Bathing position Bathing activity did not occur: N/A(night bath) Position: Bed  Bathing parts Body parts bathed by patient: Right arm, Left arm, Chest, Abdomen, Front perineal area, Right upper leg, Left upper leg Body parts bathed by helper: Buttocks, Right lower leg, Left lower leg, Back  Bathing assist Assist Level: Touching or steadying assistance(Pt > 75%)      Upper Body Dressing/Undressing Upper body dressing   What is the patient wearing?: Pull over shirt/dress     Pull over shirt/dress - Perfomed by patient: Thread/unthread right sleeve, Thread/unthread left sleeve, Pull shirt over trunk Pull over shirt/dress - Perfomed by helper: Put head through opening        Upper body assist Assist Level: Touching or steadying assistance(Pt > 75%)      Lower Body Dressing/Undressing Lower body dressing   What is the patient wearing?: Pants, Ted Hose, Non-skid slipper socks     Pants- Performed by patient: Thread/unthread right pants leg, Thread/unthread left pants leg Pants- Performed by helper: Pull pants up/down Non-skid slipper socks- Performed by patient: Don/doff right sock, Don/doff left sock Non-skid slipper socks- Performed by helper: Don/doff right sock, Don/doff left sock               TED Hose - Performed by helper: Don/doff right TED hose, Don/doff left TED hose  Lower body assist Assist for lower body dressing: Touching or steadying assistance (Pt > 75%)      Toileting Toileting Toileting activity did not occur: No continent bowel/bladder event Toileting steps completed by patient: Adjust clothing prior to  toileting, Adjust clothing after toileting Toileting steps completed by helper: Adjust clothing prior to toileting, Performs perineal hygiene, Adjust clothing after toileting    Toileting assist Assist level: Touching or steadying assistance (Pt.75%), Set up/obtain supplies   Transfers Chair/bed  transfer   Chair/bed transfer method: Lateral scoot Chair/bed transfer assist level: Touching or steadying assistance (Pt > 75%) Chair/bed transfer assistive device: Armrests, Sliding board Mechanical lift: Stedy   Locomotion Ambulation Ambulation activity did not occur: Safety/medical concerns   Max distance: 5' Assist level: Touching or steadying assistance (Pt > 75%)   Wheelchair   Type: Manual Max wheelchair distance: 150 Assist Level: Supervision or verbal cues  Cognition Comprehension Comprehension assist level: Follows complex conversation/direction with extra time/assistive device  Expression Expression assist level: Expresses complex ideas: With no assist  Social Interaction Social Interaction assist level: Interacts appropriately with others with medication or extra time (anti-anxiety, antidepressant).  Problem Solving Problem solving assist level: Solves complex problems: With extra time  Memory Memory assist level: More than reasonable amount of time   Medical Problem List and Plan: 1. 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   -Discharge home today.  Follow-up with me in 1-2 weeks for transitional care visit 2. DVT Prophylaxis/Anticoagulation: Subcutaneous Lovenox.  3. Pain Management: Oxycodone 5 mg every 6 hours as needed -fair control 4. Mood:   trazodone 50 mg daily at bedtime,   Zoloft 50 mg daily, amantadine 100 mg daily,     -improved overall  -klonopin stopped   -stopped seroquel and provigil    -continue on trazodone  -off amantadine too 5. Neuropsych: This patient iscapable of making decisions on hisown behalf. 6. Skin/Wound Care: Routine skin checks  - continue air mattress due to sensitive skin 7. Fluids/Electrolytes/Nutrition: Routine I&Os  -replacing k+       8.Nondisplaced fractures of right T7 transverse  process. Conservative care 9.Multiple pelvic fractures/bilateral sacroiliac joints and suprapubic symphysis as well as left pelvic sidewall hematoma. Status post sacroiliac screw fixation with external fixation pelvis anterior pelvic ring closed reduction 02/07/2017. May ambulate extended distances weightbearing as tolerated without restrictions. 10.Seizure prophylaxis. Keppra 500 mg every 12 hours. EEG negative 11.Tracheostomy tube 03/01/2017 per Dr. Hulen Skains. Decannulated. 12.GI: Gastrostomy tube 03/01/2017 per Dr. Hulen Skains.  .   -Removed PEG without issues    Tolerating regular diet  13.Urinary retention/urethral injury. Status post suprapubic catheter placement 03/21/2017 per interventional radiology. Follow-up urology services as outpt  -SPC  changed cath out 04/27/2017  -.urine again positive for 100,000 Proteus.   Bactrim: continue for 10-day course given recurrence 14.Acute blood loss anemia. Continue iron supplement   Hemoglobin up to 10.0      15. DM 2:   -  continue carb modified restriction  -amaryl 2mg  resumed with improvement   CBG (last 3)  Recent Labs    05/18/17 1635 05/18/17 2121 05/19/17 0626  GLUCAP 80 132* 109*    -Good control 11/29 16. Tachycardia   -due to deconditioning   -metoprolol increased on 11/18  -Improved control  17. Elevated blood pressure   metoprolol increased to 50 on 11/18  -better control overall  LOS (Days) 34 A FACE TO FACE EVALUATION WAS PERFORMED  [name], MD 05/19/2017 9:13 AM

## 2017-05-22 ENCOUNTER — Telehealth: Payer: Self-pay

## 2017-05-22 NOTE — Progress Notes (Signed)
Social Work  Discharge Note  The overall goal for the admission was met for:   Discharge location: Yes - home with wife and family providing 24/7 assistance  Length of Stay: Yes -  34 days  Discharge activity level: Yes - min assist w/c level overall  Home/community participation: Yes  Services provided included: MD, RD, PT, OT, SLP, RN, TR, Pharmacy, Neuropsych and SW  Financial Services: Private Insurance: Ship broker  Follow-up services arranged: Home Health: Therapist, sports, PT, OT via [place], DME: hospital bed, drop arm commode and 30" transfer board via [place] and Patient/Family has no preference for HH/DME agencies  Comments (or additional information):  Patient/Family verbalized understanding of follow-up arrangements: Yes  Individual responsible for coordination of the follow-up plan: pt  Confirmed correct DME delivered: Lavora Brisbon 05/22/2017    Victorya Hillman

## 2017-05-22 NOTE — Telephone Encounter (Signed)
Patients care giver called back:  Transitional Care call  Patient name: Peter [name], Peter [name]) DOB: (March 28, 1961) 1. Are you/is patient experiencing any problems since coming home? (NO) a. Are there any questions regarding any aspect of care? (NO) 2. Are there any questions regarding medications administration/dosing? (NO) a. Are meds being taken as prescribed? (YES) b. "Patient should review meds with caller to confirm"  3. Have there been any falls? (NO) 4. Has Home Health been to the house and/or have they contacted you? (YES) a. If not, have you tried to contact them? (NA) b. Can we help you contact them? (NA) 5. Are bowels and bladder emptying properly? (YES) a. Are there any unexpected incontinence issues? (NO) b. If applicable, is patient following bowel/bladder programs? (NA) 6. Any fevers, problems with breathing, unexpected pain? (NO) 7. Are there any skin problems or new areas of breakdown? (NO) 8. Has the patient/family member arranged specialty MD follow up (ie cardiology/neurology/renal/surgical/etc.)?  (YES) a. Can we help arrange? (NA) 9. Does the patient need any other services or support that we can help arrange? (NO) 10. Are caregivers following through as expected in assisting the patient? (YES) 11. Has the patient quit smoking, drinking alcohol, or using drugs as recommended? (NA)  Appointment date/time (05-29-17 / 940), arrive time (920) and who it is with here (Dr. Naaman Plummer) [place]

## 2017-05-22 NOTE — Telephone Encounter (Signed)
Transitional care call, no answer, left message for patient to call office.

## 2017-05-23 ENCOUNTER — Telehealth: Payer: Self-pay

## 2017-05-23 NOTE — Telephone Encounter (Signed)
Leonarda Salon OT [facility] called requesting verbal orders for OT, PT, and Nursing for this Patient, verbal orders approved

## 2017-05-24 ENCOUNTER — Encounter: Payer: BLUE CROSS/BLUE SHIELD | Admitting: Physical Medicine & Rehabilitation

## 2017-05-29 ENCOUNTER — Encounter: Payer: BLUE CROSS/BLUE SHIELD | Admitting: Physical Medicine & Rehabilitation

## 2017-06-16 ENCOUNTER — Telehealth: Payer: Self-pay | Admitting: *Deleted

## 2017-06-16 NOTE — Telephone Encounter (Signed)
Colletta Maryland, [employer], [facility] home health is asking for verbal orders to confirm new order for new start of care for [facility] PT and RN.  She says that the law mandates that new orders be implemented as the patient is going through an insurance change.  I contacted  The clinic manager and gave verbal orders per office protocol

## 2017-06-28 ENCOUNTER — Telehealth: Payer: Self-pay | Admitting: *Deleted

## 2017-06-28 ENCOUNTER — Telehealth: Payer: Self-pay

## 2017-06-28 NOTE — Telephone Encounter (Signed)
PROCEED

## 2017-06-28 NOTE — Telephone Encounter (Signed)
Called her back and approved verbal orders. 

## 2017-06-28 NOTE — Telephone Encounter (Signed)
Physical therapist left a message asking for HHPT verbal orders 3week4 followed by 2week4 to address gait training, strengthening, and manual therapy.  Verbal orders given per office protocol

## 2017-06-28 NOTE — Telephone Encounter (Signed)
Vicente Males OT with home health called requesting verbal orders for patient to have twice a week for 3 weeks and once a week for 5 weeks.

## 2017-06-29 ENCOUNTER — Telehealth: Payer: Self-pay

## 2017-06-29 NOTE — Telephone Encounter (Signed)
Colletta Maryland, [employer] with home health is wanting Dr. Naaman Plummer to be aware of patient complaining of UTI symptoms. She stated that the patient has clumps of sediment in urine tubing and is complaining of burning. She stated that the patient has an appointment next week with Dr. Naaman Plummer. Patient is requesting a prescription for sulfa to clear up the UTI. She also stated that if there is another direction she should take to please let her know. Please advise.

## 2017-06-30 NOTE — Telephone Encounter (Signed)
This patient never followed up with me after his November discharge.  He would need a urinalysis and urine culture first before any antibiotics would be prescribed.  Further treatment by me will require visit at the office.

## 2017-07-03 NOTE — Telephone Encounter (Signed)
Contacted [employer] and gave a verbal order to obtain a urinalysis and urine culture per Dr. Naaman Plummer.  Informed that Dr. Naaman Plummer will treat the results of the urine culture.  However, any further treatment will require an office visit.

## 2017-07-05 ENCOUNTER — Encounter: Payer: Self-pay | Admitting: Physical Medicine & Rehabilitation

## 2017-07-05 ENCOUNTER — Encounter: Payer: 59 | Attending: Physical Medicine & Rehabilitation | Admitting: Physical Medicine & Rehabilitation

## 2017-07-05 VITALS — BP 95/61 | HR 116 | Resp 14

## 2017-07-05 DIAGNOSIS — S3282XS Multiple fractures of pelvis without disruption of pelvic ring, sequela: Secondary | ICD-10-CM | POA: Diagnosis not present

## 2017-07-05 DIAGNOSIS — S32810S Multiple fractures of pelvis with stable disruption of pelvic ring, sequela: Secondary | ICD-10-CM | POA: Diagnosis not present

## 2017-07-05 DIAGNOSIS — X58XXXS Exposure to other specified factors, sequela: Secondary | ICD-10-CM | POA: Diagnosis not present

## 2017-07-05 DIAGNOSIS — Z9359 Other cystostomy status: Secondary | ICD-10-CM | POA: Diagnosis not present

## 2017-07-05 DIAGNOSIS — E119 Type 2 diabetes mellitus without complications: Secondary | ICD-10-CM | POA: Insufficient documentation

## 2017-07-05 DIAGNOSIS — S22061S Stable burst fracture of T7-T8 vertebra, sequela: Secondary | ICD-10-CM | POA: Insufficient documentation

## 2017-07-05 DIAGNOSIS — N390 Urinary tract infection, site not specified: Secondary | ICD-10-CM | POA: Diagnosis not present

## 2017-07-05 DIAGNOSIS — G6281 Critical illness polyneuropathy: Secondary | ICD-10-CM | POA: Diagnosis not present

## 2017-07-05 DIAGNOSIS — S06303S Unspecified focal traumatic brain injury with loss of consciousness of 1 hour to 5 hours 59 minutes, sequela: Secondary | ICD-10-CM | POA: Diagnosis present

## 2017-07-05 DIAGNOSIS — R339 Retention of urine, unspecified: Secondary | ICD-10-CM | POA: Insufficient documentation

## 2017-07-05 DIAGNOSIS — A499 Bacterial infection, unspecified: Secondary | ICD-10-CM

## 2017-07-05 DIAGNOSIS — Z7984 Long term (current) use of oral hypoglycemic drugs: Secondary | ICD-10-CM | POA: Diagnosis not present

## 2017-07-05 DIAGNOSIS — E7800 Pure hypercholesterolemia, unspecified: Secondary | ICD-10-CM | POA: Insufficient documentation

## 2017-07-05 NOTE — Progress Notes (Signed)
Subjective:    Patient ID: Peter [name], male    DOB: 1960-10-26, 57 y.o.   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  He was discharged from rehab on November 30 and has been home since then receiving home health therapies.  He did not make his December follow-up with me because of the snowstorm.  He is come off a lot of his medications and is really only using trazodone Zoloft Lopressor Amaryl and Pepcid.  He has what he thinks is another urinary tract infection as his urine has become cloudy and has an odor.  He is followed up with urology regarding the suprapubic catheter and a recommended ongoing use.  When the catheter has been occluded he has been able to void on his own and has interest in removing the suprapubic catheter.  He states that he checks his sugars on occasion but has not done it consistently.  He does not like to be stuck.  He was taking 4 mg of Amaryl as well as metformin prior to the hospitalization.  We send him home on Amaryl 2 mg daily.  Most recent CBG that he recalls was 134.  He states that his bowels are moving.  Sleep seems to be fair to good and he has been in good spirits.  Therapy has been coming to the house and working on strength and mobility.  He has has been walking now around the home for therapeutic purposes. Pain Inventory Average Pain 2 Pain Right Now 3 My pain is other  In the last 24 hours, has pain interfered with the following? General activity 0 Relation with others 0 Enjoyment of life 0 What TIME of day is your pain at its worst? night Sleep (in general) Fair  Pain is worse with: walking and bending Pain improves with: n/a Relief from Meds: n/a  Mobility walk with assistance use a walker ability to climb steps?  no do you drive?  no use a wheelchair needs help with transfers  Function employed # of hrs/week . 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  no  Physicians involved in your care Any changes since last visit?  no   History reviewed. No pertinent family history. Social History   Socioeconomic History  . Marital status: Married    Spouse name: None  . Number of children: None  . Years of education: None  . Highest education level: None  Social Needs  . Financial resource strain: None  . Food insecurity - worry: None  . Food insecurity - inability: None  . Transportation needs - medical: None  . Transportation needs - non-medical: None  Occupational History  . None  Tobacco Use  . Smoking status: Never Smoker  . Smokeless tobacco: Never Used  Substance and Sexual Activity  . Alcohol use: No  . Drug use: No  . Sexual activity: None  Other Topics Concern  . None  Social History Narrative  . None   Past Surgical History:  Procedure Laterality Date  . ESOPHAGOGASTRODUODENOSCOPY N/A 03/01/2017   Procedure: ESOPHAGOGASTRODUODENOSCOPY (EGD);  Surgeon: Judeth Horn, MD;  Location: [place];  Service: General;  Laterality: N/A;  . EXTERNAL FIXATION PELVIS N/A 02/07/2017   Procedure: EXTERNAL FIXATION PELVIS;  Surgeon: Altamese Pantops, MD;  Location: [place];  Service:  Orthopedics;  Laterality: N/A;  . HERNIA REPAIR    . IR ANGIOGRAM PELVIS SELECTIVE OR SUPRASELECTIVE  02/07/2017  . IR ANGIOGRAM SELECTIVE EACH ADDITIONAL VESSEL  02/07/2017  . IR ANGIOGRAM SELECTIVE EACH ADDITIONAL VESSEL  02/07/2017  . IR ANGIOGRAM VISCERAL SELECTIVE  02/07/2017  . IR ANGIOGRAM VISCERAL SELECTIVE  02/07/2017  . IR CATHETER TUBE CHANGE  04/26/2017  . IR EMBO ART  VEN HEMORR LYMPH EXTRAV  INC GUIDE ROADMAPPING  02/07/2017  . IR EMBO ART  VEN HEMORR LYMPH EXTRAV  INC GUIDE ROADMAPPING  02/07/2017  . IR FLUORO GUIDED NEEDLE PLC ASPIRATION/INJECTION LOC  03/22/2017  . IR US GUIDE VASC ACCESS RIGHT  02/07/2017  .  ORIF PELVIC FRACTURE N/A 02/13/2017   Procedure: OPEN REDUCTION INTERNAL FIXATION (ORIF) PELVIC FRACTURE;  Surgeon: Altamese Halawa, MD;  Location: [place];  Service: Orthopedics;  Laterality: N/A;  . PEG PLACEMENT N/A 03/01/2017   Procedure: PERCUTANEOUS ENDOSCOPIC GASTROSTOMY (PEG) PLACEMENT;  Surgeon: Judeth Horn, MD;  Location: [place];  Service: General;  Laterality: N/A;  . PERCUTANEOUS TRACHEOSTOMY N/A 03/01/2017   Procedure: PERCUTANEOUS TRACHEOSTOMY AT BEDSIDE;  Surgeon: Judeth Horn, MD;  Location: [place];  Service: General;  Laterality: N/A;  . RADIOLOGY WITH ANESTHESIA N/A 02/07/2017   Procedure: RADIOLOGY WITH ANESTHESIA;  Surgeon: Greggory Keen, MD;  Location: [place];  Service: Radiology;  Laterality: N/A;  . SACROILIAC JOINT FUSION N/A 02/07/2017   Procedure: SACROILIAC JOINT FUSION;  Surgeon: Altamese [place], MD;  Location: [facility];  Service: Orthopedics;  Laterality: N/A;   Past Medical History:  Diagnosis Date  . Diabetes mellitus without complication ([place])   . High cholesterol   . Multiple closed anterior-posterior compression fractures of pelvis ([place]) 02/08/2017   BP 95/61 (BP Location: Right Arm, Patient Position: Sitting, Cuff Size: Normal)   Pulse (!) 116   Resp 14   SpO2 98%   Opioid Risk Score:   Fall Risk Score:  `1  Depression screen PHQ 2/9  No flowsheet data found.  Review of Systems  Constitutional: Negative.   HENT: Negative.   Eyes: Negative.   Respiratory: Negative.   Cardiovascular: Negative.   Gastrointestinal: Negative.   Endocrine: Negative.   Genitourinary: Negative.   Musculoskeletal: Positive for gait problem.  Skin: Negative.   Allergic/Immunologic: Negative.   Hematological: Negative.   Psychiatric/Behavioral: Negative.   All other systems reviewed and are negative.      Objective:   Physical Exam  Constitutional: NAD.  He appears to have put on some weight although still appears frail. HENT: Normocephalicand atraumatic.  Eyes:  EOMare normal. No discharge.  Cardiovascular: RRR      Respiratory: CTA b    GI: Bowel sounds positive, abdomen nontender.  PEG site closed/healed Skin. Warm and dry, intact Musculoskeletal. No edema. No tenderness.  Genitourinary.  + Suprapubic cath in place and draining, urine clear Neurological.  He is very alert.  Attention is limited and he tends to jump from topic to topic.  He needs frequent redirection.  Comprehension seems reasonable and memory seems functional..  Follows simple commands.   B/l UE Bilateral 4-TO 4/5 Deltoid, biceps, triceps and 4 out of 5 hand intrinsics.  B/l LE: 3-3+/5 HF, KE,3-3+/5  ADF,APF--sensory exam appears intact to pain and light touch Psych: Very alert but almost impulsive in appearance today.        Assessment & Plan:  1. Decreased functional mobility with cognitive deficitssecondary to TBI/polytrauma 02/07/2017, additional deconditioning due to prolonged hospital stay           -  CIM 2. Mood/sleep:   trazodone 50 mg daily at bedtime,   Zoloft 50 mg daily--he should continue both of these              3Nondisplaced fractures of right T7 transverse process. Conservative care 4.Multiple pelvic fractures/bilateral sacroiliac joints and suprapubic symphysis as well as left pelvic sidewall hematoma. Status post sacroiliac screw fixation with external fixation pelvis anterior pelvic ring closed reduction 02/07/2017. May ambulate extended distances weightbearing as tolerated without restrictions. 5.Seizure prophylaxis. Keppra 500 mg every 12 hours. EEG negative.  He has been off the [place] for at least 2 weeks.  I will not resume 6.Urinary retention/urethral injury. Status post suprapubic catheter placement 03/21/2017 per interventional radiology. Follow-up urology services.  He might be at the point where this can be removed.  I encouraged him to occlude the catheter and trial some voiding on his own to see how he does.  If he is successful he will call  urology for direction           -will send for ua,ucx given cloudy, foul-smelling urine 7. DM 2:            - discussed cbg checks and schedulling            -amaryl 2mg  resumed with improvement  25 minutes of direct patient time was spent today with this patient.  I will see him back in about 2 months time

## 2017-07-05 NOTE — Patient Instructions (Addendum)
PLEASE FEEL FREE TO CALL OUR OFFICE WITH ANY PROBLEMS OR QUESTIONS ([phone])     CHECK YOUR SUGARS DAILY. STAGGER THE TIMES YOU CHECK.    CHECK WITH UROLOGY REGARDING NEED FOR CATHETER. TRIAL OF VOIDING WITH CATHETER PLUGGED

## 2017-07-06 LAB — URINALYSIS
BILIRUBIN UA: NEGATIVE
KETONES UA: NEGATIVE
Nitrite, UA: NEGATIVE
SPEC GRAV UA: 1.01 (ref 1.005–1.030)
Urobilinogen, Ur: 0.2 mg/dL (ref 0.2–1.0)

## 2017-07-07 ENCOUNTER — Other Ambulatory Visit: Payer: Self-pay

## 2017-07-07 MED ORDER — SERTRALINE HCL 25 MG PO TABS: 50.0000 mg | ORAL_TABLET | Freq: Every day | ORAL | 1 refills | Status: DC

## 2017-07-07 MED ORDER — GLIMEPIRIDE 2 MG PO TABS: 2.0000 mg | ORAL_TABLET | Freq: Every day | ORAL | 1 refills | Status: AC

## 2017-07-07 MED ORDER — TRAZODONE HCL 50 MG PO TABS: 50.0000 mg | ORAL_TABLET | Freq: Every day | ORAL | 1 refills | Status: DC

## 2017-07-10 LAB — URINE CULTURE

## 2017-08-18 ENCOUNTER — Telehealth: Payer: Self-pay

## 2017-08-18 NOTE — Telephone Encounter (Signed)
Peter [name] OT [facility] called requesting verbal orders for 1xwk X 8wks for upper extremity use and ROM exercises.  Called her back and approved verbal orders.  2xwk X 8wks for physical therapy as well was approved

## 2017-08-28 ENCOUNTER — Ambulatory Visit: Payer: 59 | Admitting: Physical Medicine & Rehabilitation

## 2017-08-30 ENCOUNTER — Encounter: Payer: 59 | Admitting: Physical Medicine & Rehabilitation

## 2017-10-12 ENCOUNTER — Telehealth: Payer: Self-pay | Admitting: *Deleted

## 2017-10-12 NOTE — Telephone Encounter (Signed)
Occupational therapist went out for a visit. Left a message reporting that patient bleeding through his sock.  Caregiver cleaned up the blood.  OT concerned and reports because patient is diabetic.

## 2017-10-13 NOTE — Telephone Encounter (Signed)
Pt needs to be assessed a medical provider---either his primary or I can see him at office. Perhaps Zella Ball, too.  In the meantime, keep area clean, covered if possible

## 2017-10-18 NOTE — Telephone Encounter (Signed)
Contacted Peter [name], OT and informed that Dr. Naaman Plummer advise medical treatment by PCP or by appointment with Dr. Naaman Plummer.  Contacted patient and advised as well

## 2017-10-20 ENCOUNTER — Other Ambulatory Visit: Payer: Self-pay
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
## 2019-11-20 ENCOUNTER — Encounter
Payer: BC Managed Care – PPO | Attending: Physical Medicine & Rehabilitation | Admitting: Physical Medicine & Rehabilitation

## 2019-11-20 ENCOUNTER — Other Ambulatory Visit: Payer: Self-pay

## 2019-11-20 ENCOUNTER — Encounter: Payer: Self-pay | Admitting: Physical Medicine & Rehabilitation

## 2019-11-20 VITALS — BP 192/93 | HR 56 | Ht 70.0 in | Wt 166.0 lb

## 2019-11-20 DIAGNOSIS — I10 Essential (primary) hypertension: Secondary | ICD-10-CM | POA: Insufficient documentation

## 2019-11-20 DIAGNOSIS — G8918 Other acute postprocedural pain: Secondary | ICD-10-CM | POA: Insufficient documentation

## 2019-11-20 DIAGNOSIS — S06303S Unspecified focal traumatic brain injury with loss of consciousness of 1 hour to 5 hours 59 minutes, sequela: Secondary | ICD-10-CM | POA: Diagnosis not present

## 2019-11-20 NOTE — Patient Instructions (Addendum)
RECOMMEND USING METOPROLOL TWICE DAILY FOR OPTIMUM EFFECT.   RECOMMEND WEARING HIGH-TOP SHOES AND BOOTS WHEN YOU'RE WALKING ON UNEVEN SURFACES.   LOOK AT INCREASING YOUR WALKING, USING A STATIONARY BIKE OR TREADMILL FOR EXERCISE
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
## 2020-01-31 ENCOUNTER — Inpatient Hospital Stay (HOSPITAL_COMMUNITY)
Admission: EM | Admit: 2020-01-31 | Discharge: 2020-02-14 | DRG: 853 | Disposition: A | Discharge status: Home Health | Payer: BC Managed Care – PPO | Attending: Internal Medicine | Admitting: Internal Medicine

## 2020-01-31 ENCOUNTER — Other Ambulatory Visit: Payer: Self-pay

## 2020-01-31 ENCOUNTER — Encounter (HOSPITAL_COMMUNITY): Payer: Self-pay | Admitting: Emergency Medicine

## 2020-01-31 DIAGNOSIS — E785 Hyperlipidemia, unspecified: Secondary | ICD-10-CM | POA: Diagnosis present

## 2020-01-31 DIAGNOSIS — A4102 Sepsis due to Methicillin resistant Staphylococcus aureus: Secondary | ICD-10-CM | POA: Diagnosis not present

## 2020-01-31 DIAGNOSIS — I10 Essential (primary) hypertension: Secondary | ICD-10-CM | POA: Diagnosis present

## 2020-01-31 DIAGNOSIS — E1165 Type 2 diabetes mellitus with hyperglycemia: Secondary | ICD-10-CM | POA: Diagnosis not present

## 2020-01-31 DIAGNOSIS — A419 Sepsis, unspecified organism: Secondary | ICD-10-CM

## 2020-01-31 DIAGNOSIS — L0390 Cellulitis, unspecified: Secondary | ICD-10-CM

## 2020-01-31 DIAGNOSIS — E875 Hyperkalemia: Secondary | ICD-10-CM | POA: Diagnosis present

## 2020-01-31 DIAGNOSIS — Z8249 Family history of ischemic heart disease and other diseases of the circulatory system: Secondary | ICD-10-CM

## 2020-01-31 DIAGNOSIS — L03317 Cellulitis of buttock: Secondary | ICD-10-CM

## 2020-01-31 DIAGNOSIS — E872 Acidosis, unspecified: Secondary | ICD-10-CM | POA: Diagnosis present

## 2020-01-31 DIAGNOSIS — Z841 Family history of disorders of kidney and ureter: Secondary | ICD-10-CM

## 2020-01-31 DIAGNOSIS — L03119 Cellulitis of unspecified part of limb: Secondary | ICD-10-CM

## 2020-01-31 DIAGNOSIS — Z20822 Contact with and (suspected) exposure to covid-19: Secondary | ICD-10-CM | POA: Diagnosis present

## 2020-01-31 DIAGNOSIS — I129 Hypertensive chronic kidney disease with stage 1 through stage 4 chronic kidney disease, or unspecified chronic kidney disease: Secondary | ICD-10-CM | POA: Diagnosis present

## 2020-01-31 DIAGNOSIS — Z79899 Other long term (current) drug therapy: Secondary | ICD-10-CM

## 2020-01-31 DIAGNOSIS — B9562 Methicillin resistant Staphylococcus aureus infection as the cause of diseases classified elsewhere: Secondary | ICD-10-CM

## 2020-01-31 DIAGNOSIS — N179 Acute kidney failure, unspecified: Secondary | ICD-10-CM

## 2020-01-31 DIAGNOSIS — R278 Other lack of coordination: Secondary | ICD-10-CM | POA: Diagnosis not present

## 2020-01-31 DIAGNOSIS — L98419 Non-pressure chronic ulcer of buttock with unspecified severity: Secondary | ICD-10-CM | POA: Diagnosis present

## 2020-01-31 DIAGNOSIS — N184 Chronic kidney disease, stage 4 (severe): Secondary | ICD-10-CM | POA: Diagnosis present

## 2020-01-31 DIAGNOSIS — L0231 Cutaneous abscess of buttock: Secondary | ICD-10-CM

## 2020-01-31 DIAGNOSIS — R7881 Bacteremia: Secondary | ICD-10-CM

## 2020-01-31 DIAGNOSIS — IMO0002 Reserved for concepts with insufficient information to code with codable children: Secondary | ICD-10-CM

## 2020-01-31 DIAGNOSIS — E1169 Type 2 diabetes mellitus with other specified complication: Secondary | ICD-10-CM | POA: Diagnosis present

## 2020-01-31 DIAGNOSIS — N136 Pyonephrosis: Secondary | ICD-10-CM | POA: Diagnosis present

## 2020-01-31 DIAGNOSIS — N3000 Acute cystitis without hematuria: Secondary | ICD-10-CM

## 2020-01-31 DIAGNOSIS — E1122 Type 2 diabetes mellitus with diabetic chronic kidney disease: Secondary | ICD-10-CM | POA: Diagnosis present

## 2020-01-31 DIAGNOSIS — Z7984 Long term (current) use of oral hypoglycemic drugs: Secondary | ICD-10-CM

## 2020-01-31 DIAGNOSIS — I951 Orthostatic hypotension: Secondary | ICD-10-CM | POA: Diagnosis not present

## 2020-01-31 DIAGNOSIS — Z87820 Personal history of traumatic brain injury: Secondary | ICD-10-CM

## 2020-01-31 DIAGNOSIS — Z833 Family history of diabetes mellitus: Secondary | ICD-10-CM

## 2020-01-31 DIAGNOSIS — D631 Anemia in chronic kidney disease: Secondary | ICD-10-CM | POA: Diagnosis present

## 2020-01-31 DIAGNOSIS — E7800 Pure hypercholesterolemia, unspecified: Secondary | ICD-10-CM | POA: Diagnosis present

## 2020-01-31 DIAGNOSIS — G9341 Metabolic encephalopathy: Secondary | ICD-10-CM | POA: Diagnosis not present

## 2020-01-31 DIAGNOSIS — N189 Chronic kidney disease, unspecified: Secondary | ICD-10-CM

## 2020-01-31 DIAGNOSIS — E782 Mixed hyperlipidemia: Secondary | ICD-10-CM | POA: Diagnosis present

## 2020-01-31 DIAGNOSIS — E1121 Type 2 diabetes mellitus with diabetic nephropathy: Secondary | ICD-10-CM

## 2020-01-31 LAB — COMPREHENSIVE METABOLIC PANEL
ALT: 29 U/L (ref 0–44)
AST: 27 U/L (ref 15–41)
Albumin: 2.4 g/dL — ABNORMAL LOW (ref 3.5–5.0)
Alkaline Phosphatase: 90 U/L (ref 38–126)
Anion gap: 15 (ref 5–15)
BUN: 80 mg/dL — ABNORMAL HIGH (ref 6–20)
CO2: 13 mmol/L — ABNORMAL LOW (ref 22–32)
Calcium: 8.2 mg/dL — ABNORMAL LOW (ref 8.9–10.3)
Chloride: 104 mmol/L (ref 98–111)
Creatinine, Ser: 6 mg/dL — ABNORMAL HIGH (ref 0.61–1.24)
GFR calc Af Amer: 11 mL/min — ABNORMAL LOW (ref >60)
GFR calc non Af Amer: 9 mL/min — ABNORMAL LOW (ref >60)
Glucose, Bld: 224 mg/dL — ABNORMAL HIGH (ref 70–99)
Potassium: 4.7 mmol/L (ref 3.5–5.1)
Sodium: 132 mmol/L — ABNORMAL LOW (ref 135–145)
Total Bilirubin: 0.7 mg/dL (ref 0.3–1.2)
Total Protein: 6.2 g/dL — ABNORMAL LOW (ref 6.5–8.1)

## 2020-01-31 LAB — CBC WITH DIFFERENTIAL/PLATELET
Abs Immature Granulocytes: 0.15 10*3/uL — ABNORMAL HIGH (ref 0.00–0.07)
Basophils Absolute: 0.1 10*3/uL (ref 0.0–0.1)
Basophils Relative: 0 %
Eosinophils Absolute: 0.2 10*3/uL (ref 0.0–0.5)
Eosinophils Relative: 1 %
HCT: 29.7 % — ABNORMAL LOW (ref 39.0–52.0)
Hemoglobin: 9.2 g/dL — ABNORMAL LOW (ref 13.0–17.0)
Immature Granulocytes: 1 %
Lymphocytes Relative: 3 %
Lymphs Abs: 0.7 10*3/uL (ref 0.7–4.0)
MCH: 29.4 pg (ref 26.0–34.0)
MCHC: 31 g/dL (ref 30.0–36.0)
MCV: 94.9 fL (ref 80.0–100.0)
Monocytes Absolute: 2.8 10*3/uL — ABNORMAL HIGH (ref 0.1–1.0)
Monocytes Relative: 12 %
Neutro Abs: 18.6 10*3/uL — ABNORMAL HIGH (ref 1.7–7.7)
Neutrophils Relative %: 83 %
Platelets: 314 10*3/uL (ref 150–400)
RBC: 3.13 MIL/uL — ABNORMAL LOW (ref 4.22–5.81)
RDW: 12.8 % (ref 11.5–15.5)
WBC: 22.6 10*3/uL — ABNORMAL HIGH (ref 4.0–10.5)
nRBC: 0 % (ref 0.0–0.2)

## 2020-01-31 LAB — URINALYSIS, ROUTINE W REFLEX MICROSCOPIC
Bilirubin Urine: NEGATIVE
Glucose, UA: >=500 mg/dL — AB
Ketones, ur: NEGATIVE mg/dL
Nitrite: NEGATIVE
Protein, ur: >=300 mg/dL — AB
Specific Gravity, Urine: 1.008 (ref 1.005–1.030)
WBC, UA: >50 WBC/hpf — ABNORMAL HIGH (ref 0–5)
pH: 5 (ref 5.0–8.0)

## 2020-01-31 NOTE — ED Triage Notes (Signed)
Pt to ED with c/o generalized weakness x's 1 week.  St's no appetite.  Nausea without vomiting.  Denies diarrhea.  Also c/o abscess on buttock with drainage

## 2020-02-01 ENCOUNTER — Encounter (HOSPITAL_COMMUNITY): Payer: Self-pay | Admitting: Internal Medicine

## 2020-02-01 ENCOUNTER — Emergency Department (HOSPITAL_COMMUNITY): Payer: BC Managed Care – PPO

## 2020-02-01 ENCOUNTER — Inpatient Hospital Stay (HOSPITAL_COMMUNITY): Payer: BC Managed Care – PPO | Admitting: Anesthesiology

## 2020-02-01 ENCOUNTER — Encounter (HOSPITAL_COMMUNITY)
Admission: EM | Disposition: A | Discharge status: Home Health | Payer: Self-pay | Source: Home / Self Care | Attending: Internal Medicine

## 2020-02-01 ENCOUNTER — Other Ambulatory Visit: Payer: Self-pay

## 2020-02-01 DIAGNOSIS — B9562 Methicillin resistant Staphylococcus aureus infection as the cause of diseases classified elsewhere: Secondary | ICD-10-CM | POA: Diagnosis not present

## 2020-02-01 DIAGNOSIS — E872 Acidosis, unspecified: Secondary | ICD-10-CM | POA: Diagnosis present

## 2020-02-01 DIAGNOSIS — G459 Transient cerebral ischemic attack, unspecified: Secondary | ICD-10-CM | POA: Diagnosis not present

## 2020-02-01 DIAGNOSIS — N136 Pyonephrosis: Secondary | ICD-10-CM | POA: Diagnosis present

## 2020-02-01 DIAGNOSIS — N179 Acute kidney failure, unspecified: Secondary | ICD-10-CM

## 2020-02-01 DIAGNOSIS — L03317 Cellulitis of buttock: Secondary | ICD-10-CM | POA: Diagnosis present

## 2020-02-01 DIAGNOSIS — E1169 Type 2 diabetes mellitus with other specified complication: Secondary | ICD-10-CM

## 2020-02-01 DIAGNOSIS — Z20822 Contact with and (suspected) exposure to covid-19: Secondary | ICD-10-CM | POA: Diagnosis present

## 2020-02-01 DIAGNOSIS — N184 Chronic kidney disease, stage 4 (severe): Secondary | ICD-10-CM

## 2020-02-01 DIAGNOSIS — I351 Nonrheumatic aortic (valve) insufficiency: Secondary | ICD-10-CM | POA: Diagnosis not present

## 2020-02-01 DIAGNOSIS — I129 Hypertensive chronic kidney disease with stage 1 through stage 4 chronic kidney disease, or unspecified chronic kidney disease: Secondary | ICD-10-CM | POA: Diagnosis present

## 2020-02-01 DIAGNOSIS — L98419 Non-pressure chronic ulcer of buttock with unspecified severity: Secondary | ICD-10-CM | POA: Diagnosis present

## 2020-02-01 DIAGNOSIS — I951 Orthostatic hypotension: Secondary | ICD-10-CM | POA: Diagnosis not present

## 2020-02-01 DIAGNOSIS — Z7984 Long term (current) use of oral hypoglycemic drugs: Secondary | ICD-10-CM | POA: Diagnosis not present

## 2020-02-01 DIAGNOSIS — D631 Anemia in chronic kidney disease: Secondary | ICD-10-CM | POA: Diagnosis present

## 2020-02-01 DIAGNOSIS — R278 Other lack of coordination: Secondary | ICD-10-CM | POA: Diagnosis not present

## 2020-02-01 DIAGNOSIS — E785 Hyperlipidemia, unspecified: Secondary | ICD-10-CM | POA: Diagnosis present

## 2020-02-01 DIAGNOSIS — E1165 Type 2 diabetes mellitus with hyperglycemia: Secondary | ICD-10-CM

## 2020-02-01 DIAGNOSIS — A4102 Sepsis due to Methicillin resistant Staphylococcus aureus: Secondary | ICD-10-CM | POA: Diagnosis present

## 2020-02-01 DIAGNOSIS — I10 Essential (primary) hypertension: Secondary | ICD-10-CM

## 2020-02-01 DIAGNOSIS — E7800 Pure hypercholesterolemia, unspecified: Secondary | ICD-10-CM | POA: Diagnosis present

## 2020-02-01 DIAGNOSIS — Z8249 Family history of ischemic heart disease and other diseases of the circulatory system: Secondary | ICD-10-CM | POA: Diagnosis not present

## 2020-02-01 DIAGNOSIS — E875 Hyperkalemia: Secondary | ICD-10-CM | POA: Diagnosis not present

## 2020-02-01 DIAGNOSIS — E1121 Type 2 diabetes mellitus with diabetic nephropathy: Secondary | ICD-10-CM

## 2020-02-01 DIAGNOSIS — Z841 Family history of disorders of kidney and ureter: Secondary | ICD-10-CM | POA: Diagnosis not present

## 2020-02-01 DIAGNOSIS — E782 Mixed hyperlipidemia: Secondary | ICD-10-CM | POA: Diagnosis present

## 2020-02-01 DIAGNOSIS — L0231 Cutaneous abscess of buttock: Secondary | ICD-10-CM | POA: Diagnosis present

## 2020-02-01 DIAGNOSIS — G9341 Metabolic encephalopathy: Secondary | ICD-10-CM | POA: Diagnosis not present

## 2020-02-01 DIAGNOSIS — E1122 Type 2 diabetes mellitus with diabetic chronic kidney disease: Secondary | ICD-10-CM | POA: Diagnosis present

## 2020-02-01 DIAGNOSIS — Z833 Family history of diabetes mellitus: Secondary | ICD-10-CM | POA: Diagnosis not present

## 2020-02-01 DIAGNOSIS — R7881 Bacteremia: Secondary | ICD-10-CM | POA: Diagnosis not present

## 2020-02-01 HISTORY — PX: IRRIGATION AND DEBRIDEMENT ABSCESS: SHX5252

## 2020-02-01 LAB — CBC WITH DIFFERENTIAL/PLATELET
Abs Immature Granulocytes: 0.43 10*3/uL — ABNORMAL HIGH (ref 0.00–0.07)
Basophils Absolute: 0.1 10*3/uL (ref 0.0–0.1)
Basophils Relative: 0 %
Eosinophils Absolute: 0 10*3/uL (ref 0.0–0.5)
Eosinophils Relative: 0 %
HCT: 28.3 % — ABNORMAL LOW (ref 39.0–52.0)
Hemoglobin: 8.8 g/dL — ABNORMAL LOW (ref 13.0–17.0)
Immature Granulocytes: 1 %
Lymphocytes Relative: 1 %
Lymphs Abs: 0.4 10*3/uL — ABNORMAL LOW (ref 0.7–4.0)
MCH: 29.2 pg (ref 26.0–34.0)
MCHC: 31.1 g/dL (ref 30.0–36.0)
MCV: 94 fL (ref 80.0–100.0)
Monocytes Absolute: 3 10*3/uL — ABNORMAL HIGH (ref 0.1–1.0)
Monocytes Relative: 10 %
Neutro Abs: 26.6 10*3/uL — ABNORMAL HIGH (ref 1.7–7.7)
Neutrophils Relative %: 88 %
Platelets: 328 10*3/uL (ref 150–400)
RBC: 3.01 MIL/uL — ABNORMAL LOW (ref 4.22–5.81)
RDW: 12.9 % (ref 11.5–15.5)
WBC: 30.5 10*3/uL — ABNORMAL HIGH (ref 4.0–10.5)
nRBC: 0 % (ref 0.0–0.2)

## 2020-02-01 LAB — COMPREHENSIVE METABOLIC PANEL
ALT: 28 U/L (ref 0–44)
AST: 29 U/L (ref 15–41)
Albumin: 2.1 g/dL — ABNORMAL LOW (ref 3.5–5.0)
Alkaline Phosphatase: 87 U/L (ref 38–126)
Anion gap: 17 — ABNORMAL HIGH (ref 5–15)
BUN: 99 mg/dL — ABNORMAL HIGH (ref 6–20)
CO2: 12 mmol/L — ABNORMAL LOW (ref 22–32)
Calcium: 8 mg/dL — ABNORMAL LOW (ref 8.9–10.3)
Chloride: 103 mmol/L (ref 98–111)
Creatinine, Ser: 6.93 mg/dL — ABNORMAL HIGH (ref 0.61–1.24)
GFR calc Af Amer: 9 mL/min — ABNORMAL LOW (ref >60)
GFR calc non Af Amer: 8 mL/min — ABNORMAL LOW (ref >60)
Glucose, Bld: 259 mg/dL — ABNORMAL HIGH (ref 70–99)
Potassium: 5.6 mmol/L — ABNORMAL HIGH (ref 3.5–5.1)
Sodium: 132 mmol/L — ABNORMAL LOW (ref 135–145)
Total Bilirubin: 0.8 mg/dL (ref 0.3–1.2)
Total Protein: 5.6 g/dL — ABNORMAL LOW (ref 6.5–8.1)

## 2020-02-01 LAB — HEMOGLOBIN A1C
Hgb A1c MFr Bld: 7.4 % — ABNORMAL HIGH (ref 4.8–5.6)
Mean Plasma Glucose: 165.68 mg/dL

## 2020-02-01 LAB — GLUCOSE, CAPILLARY
Glucose-Capillary: 236 mg/dL — ABNORMAL HIGH (ref 70–99)
Glucose-Capillary: 227 mg/dL — ABNORMAL HIGH (ref 70–99)
Glucose-Capillary: 212 mg/dL — ABNORMAL HIGH (ref 70–99)
Glucose-Capillary: 184 mg/dL — ABNORMAL HIGH (ref 70–99)

## 2020-02-01 LAB — PROTIME-INR
INR: 1.3 — ABNORMAL HIGH (ref 0.8–1.2)
Prothrombin Time: 15.3 seconds — ABNORMAL HIGH (ref 11.4–15.2)

## 2020-02-01 LAB — LACTIC ACID, PLASMA: Lactic Acid, Venous: 1.8 mmol/L (ref 0.5–1.9)

## 2020-02-01 LAB — HIV ANTIBODY (ROUTINE TESTING W REFLEX): HIV Screen 4th Generation wRfx: NONREACTIVE

## 2020-02-01 LAB — SARS CORONAVIRUS 2 BY RT PCR (HOSPITAL ORDER, PERFORMED IN ~~LOC~~ HOSPITAL LAB): SARS Coronavirus 2: NEGATIVE

## 2020-02-01 LAB — APTT: aPTT: 27 seconds (ref 24–36)

## 2020-02-01 SURGERY — IRRIGATION AND DEBRIDEMENT ABSCESS
Anesthesia: General | Site: Buttocks

## 2020-02-01 MED ORDER — ALBUTEROL SULFATE (2.5 MG/3ML) 0.083% IN NEBU
2.5000 mg | INHALATION_SOLUTION | Freq: Four times a day (QID) | RESPIRATORY_TRACT | Status: DC | PRN
  Administered 2020-02-01: 2.5 mg via RESPIRATORY_TRACT

## 2020-02-01 MED ORDER — PROPOFOL 10 MG/ML IV BOLUS
INTRAVENOUS | Status: DC | PRN
  Administered 2020-02-01: 130 mg via INTRAVENOUS

## 2020-02-01 MED ORDER — PHENYLEPHRINE 40 MCG/ML (10ML) SYRINGE FOR IV PUSH (FOR BLOOD PRESSURE SUPPORT)
PREFILLED_SYRINGE | INTRAVENOUS | Status: AC
  Filled 2020-02-01: qty 10

## 2020-02-01 MED ORDER — PROPOFOL 10 MG/ML IV BOLUS
INTRAVENOUS | Status: AC
  Filled 2020-02-01: qty 20

## 2020-02-01 MED ORDER — ONDANSETRON HCL 4 MG/2ML IJ SOLN
INTRAMUSCULAR | Status: AC
  Filled 2020-02-01: qty 2

## 2020-02-01 MED ORDER — SUCCINYLCHOLINE CHLORIDE 200 MG/10ML IV SOSY
PREFILLED_SYRINGE | INTRAVENOUS | Status: AC
  Filled 2020-02-01: qty 10

## 2020-02-01 MED ORDER — SUCCINYLCHOLINE CHLORIDE 20 MG/ML IJ SOLN
INTRAMUSCULAR | Status: DC | PRN
  Administered 2020-02-01: 140 mg via INTRAVENOUS

## 2020-02-01 MED ORDER — POLYETHYLENE GLYCOL 3350 17 G PO PACK: 17.0000 g | PACK | Freq: Every day | ORAL | Status: DC | PRN

## 2020-02-01 MED ORDER — SODIUM CHLORIDE 0.9 % IV SOLN
500.0000 mg | Freq: Two times a day (BID) | INTRAVENOUS | Status: DC
  Administered 2020-02-01 – 2020-02-02 (×2): 500 mg via INTRAVENOUS
  Filled 2020-02-01 (×5): qty 0.5

## 2020-02-01 MED ORDER — MIDAZOLAM HCL 2 MG/2ML IJ SOLN
INTRAMUSCULAR | Status: AC
  Filled 2020-02-01: qty 2

## 2020-02-01 MED ORDER — ONDANSETRON HCL 4 MG/2ML IJ SOLN
INTRAMUSCULAR | Status: DC | PRN
  Administered 2020-02-01: 4 mg via INTRAVENOUS

## 2020-02-01 MED ORDER — PHENYLEPHRINE HCL (PRESSORS) 10 MG/ML IV SOLN
INTRAVENOUS | Status: DC | PRN
  Administered 2020-02-01: 80 ug via INTRAVENOUS

## 2020-02-01 MED ORDER — ROCURONIUM BROMIDE 100 MG/10ML IV SOLN
INTRAVENOUS | Status: DC | PRN
  Administered 2020-02-01: 40 mg via INTRAVENOUS

## 2020-02-01 MED ORDER — EPHEDRINE 5 MG/ML INJ
INTRAVENOUS | Status: AC
  Filled 2020-02-01: qty 10

## 2020-02-01 MED ORDER — INSULIN ASPART 100 UNIT/ML ~~LOC~~ SOLN
0.0000 [IU] | Freq: Three times a day (TID) | SUBCUTANEOUS | Status: DC
  Administered 2020-02-01: 3 [IU] via SUBCUTANEOUS
  Administered 2020-02-01: 5 [IU] via SUBCUTANEOUS
  Administered 2020-02-02: 3 [IU] via SUBCUTANEOUS
  Administered 2020-02-02: 2 [IU] via SUBCUTANEOUS
  Administered 2020-02-02: 3 [IU] via SUBCUTANEOUS
  Administered 2020-02-02: 5 [IU] via SUBCUTANEOUS
  Administered 2020-02-03: 2 [IU] via SUBCUTANEOUS
  Administered 2020-02-03: 5 [IU] via SUBCUTANEOUS
  Administered 2020-02-03: 2 [IU] via SUBCUTANEOUS
  Administered 2020-02-03: 5 [IU] via SUBCUTANEOUS
  Administered 2020-02-04: 3 [IU] via SUBCUTANEOUS
  Administered 2020-02-04 (×2): 2 [IU] via SUBCUTANEOUS
  Administered 2020-02-05: 3 [IU] via SUBCUTANEOUS
  Administered 2020-02-05: 2 [IU] via SUBCUTANEOUS
  Administered 2020-02-06 – 2020-02-07 (×5): 3 [IU] via SUBCUTANEOUS
  Administered 2020-02-07: 5 [IU] via SUBCUTANEOUS
  Administered 2020-02-08: 2 [IU] via SUBCUTANEOUS
  Administered 2020-02-08 (×2): 3 [IU] via SUBCUTANEOUS
  Administered 2020-02-08: 5 [IU] via SUBCUTANEOUS
  Administered 2020-02-09: 2 [IU] via SUBCUTANEOUS
  Administered 2020-02-09: 5 [IU] via SUBCUTANEOUS
  Administered 2020-02-09: 2 [IU] via SUBCUTANEOUS
  Administered 2020-02-09: 3 [IU] via SUBCUTANEOUS
  Administered 2020-02-10: 8 [IU] via SUBCUTANEOUS
  Administered 2020-02-10: 3 [IU] via SUBCUTANEOUS
  Administered 2020-02-11: 2 [IU] via SUBCUTANEOUS
  Administered 2020-02-11: 5 [IU] via SUBCUTANEOUS
  Administered 2020-02-11: 2 [IU] via SUBCUTANEOUS
  Administered 2020-02-11: 3 [IU] via SUBCUTANEOUS
  Administered 2020-02-12: 5 [IU] via SUBCUTANEOUS
  Administered 2020-02-12: 2 [IU] via SUBCUTANEOUS
  Administered 2020-02-12: 3 [IU] via SUBCUTANEOUS
  Administered 2020-02-12 – 2020-02-13 (×2): 2 [IU] via SUBCUTANEOUS
  Administered 2020-02-13: 3 [IU] via SUBCUTANEOUS
  Administered 2020-02-13: 5 [IU] via SUBCUTANEOUS
  Administered 2020-02-13: 2 [IU] via SUBCUTANEOUS
  Administered 2020-02-14 (×2): 3 [IU] via SUBCUTANEOUS

## 2020-02-01 MED ORDER — SIMVASTATIN 20 MG PO TABS
20.0000 mg | ORAL_TABLET | Freq: Every day | ORAL | Status: DC
  Administered 2020-02-01 – 2020-02-02 (×2): 20 mg via ORAL
  Filled 2020-02-01 (×2): qty 1

## 2020-02-01 MED ORDER — VANCOMYCIN VARIABLE DOSE PER UNSTABLE RENAL FUNCTION (PHARMACIST DOSING): Status: DC

## 2020-02-01 MED ORDER — LIDOCAINE 2% (20 MG/ML) 5 ML SYRINGE
INTRAMUSCULAR | Status: AC
  Filled 2020-02-01: qty 5

## 2020-02-01 MED ORDER — ONDANSETRON HCL 4 MG/2ML IJ SOLN
4.0000 mg | Freq: Four times a day (QID) | INTRAMUSCULAR | Status: DC | PRN
  Administered 2020-02-01 – 2020-02-02 (×2): 4 mg via INTRAVENOUS
  Filled 2020-02-01 (×2): qty 2

## 2020-02-01 MED ORDER — ONDANSETRON HCL 4 MG PO TABS: 4.0000 mg | ORAL_TABLET | Freq: Four times a day (QID) | ORAL | Status: DC | PRN

## 2020-02-01 MED ORDER — SODIUM CHLORIDE 0.9 % IV SOLN
2.0000 g | Freq: Once | INTRAVENOUS | Status: AC
  Administered 2020-02-01: 2 g via INTRAVENOUS
  Filled 2020-02-01: qty 2

## 2020-02-01 MED ORDER — SUGAMMADEX SODIUM 200 MG/2ML IV SOLN
INTRAVENOUS | Status: DC | PRN
Start: 2020-02-01 — End: 2020-02-01
  Administered 2020-02-01: 300 mg via INTRAVENOUS

## 2020-02-01 MED ORDER — ARTIFICIAL TEARS OPHTHALMIC OINT
TOPICAL_OINTMENT | OPHTHALMIC | Status: AC
  Filled 2020-02-01: qty 3.5

## 2020-02-01 MED ORDER — SUGAMMADEX SODIUM 500 MG/5ML IV SOLN
INTRAVENOUS | Status: AC
  Filled 2020-02-01: qty 5

## 2020-02-01 MED ORDER — METOPROLOL TARTRATE 50 MG PO TABS
50.0000 mg | ORAL_TABLET | Freq: Two times a day (BID) | ORAL | Status: DC
  Administered 2020-02-01 – 2020-02-08 (×16): 50 mg via ORAL
  Filled 2020-02-01 (×16): qty 1

## 2020-02-01 MED ORDER — 0.9 % SODIUM CHLORIDE (POUR BTL) OPTIME
TOPICAL | Status: DC | PRN
  Administered 2020-02-01: 1000 mL

## 2020-02-01 MED ORDER — METOPROLOL SUCCINATE ER 100 MG PO TB24: 100.0000 mg | ORAL_TABLET | Freq: Every day | ORAL | Status: DC

## 2020-02-01 MED ORDER — ACETAMINOPHEN 325 MG PO TABS
650.0000 mg | ORAL_TABLET | Freq: Four times a day (QID) | ORAL | Status: DC | PRN
  Filled 2020-02-01: qty 2

## 2020-02-01 MED ORDER — LIDOCAINE HCL (CARDIAC) PF 100 MG/5ML IV SOSY
PREFILLED_SYRINGE | INTRAVENOUS | Status: DC | PRN
  Administered 2020-02-01: 60 mg via INTRATRACHEAL

## 2020-02-01 MED ORDER — ONDANSETRON HCL 4 MG/2ML IJ SOLN
4.0000 mg | Freq: Once | INTRAMUSCULAR | Status: AC
  Administered 2020-02-01: 4 mg via INTRAVENOUS
  Filled 2020-02-01: qty 2

## 2020-02-01 MED ORDER — LACTATED RINGERS IV SOLN: INTRAVENOUS | Status: AC

## 2020-02-01 MED ORDER — FENTANYL CITRATE (PF) 250 MCG/5ML IJ SOLN
INTRAMUSCULAR | Status: DC | PRN
  Administered 2020-02-01: 100 ug via INTRAVENOUS

## 2020-02-01 MED ORDER — VANCOMYCIN HCL 1500 MG/300ML IV SOLN
1500.0000 mg | Freq: Once | INTRAVENOUS | Status: AC
  Administered 2020-02-01: 1500 mg via INTRAVENOUS
  Filled 2020-02-01: qty 300

## 2020-02-01 MED ORDER — ACETAMINOPHEN 650 MG RE SUPP: 650.0000 mg | Freq: Four times a day (QID) | RECTAL | Status: DC | PRN

## 2020-02-01 MED ORDER — LIDOCAINE HCL 2 % IJ SOLN
20.0000 mL | Freq: Once | INTRAMUSCULAR | Status: DC
  Filled 2020-02-01: qty 20

## 2020-02-01 MED ORDER — LACTATED RINGERS IV SOLN: INTRAVENOUS | Status: DC | PRN

## 2020-02-01 MED ORDER — MIDAZOLAM HCL 5 MG/5ML IJ SOLN
INTRAMUSCULAR | Status: DC | PRN
  Administered 2020-02-01: 2 mg via INTRAVENOUS

## 2020-02-01 MED ORDER — OXYCODONE-ACETAMINOPHEN 5-325 MG PO TABS
2.0000 | ORAL_TABLET | ORAL | Status: DC | PRN
  Administered 2020-02-04 – 2020-02-11 (×7): 2 via ORAL
  Filled 2020-02-01 (×9): qty 2

## 2020-02-01 MED ORDER — OXYCODONE-ACETAMINOPHEN 5-325 MG PO TABS
1.0000 | ORAL_TABLET | ORAL | Status: DC | PRN
  Filled 2020-02-01: qty 1

## 2020-02-01 MED ORDER — ALBUTEROL SULFATE (2.5 MG/3ML) 0.083% IN NEBU
INHALATION_SOLUTION | RESPIRATORY_TRACT | Status: AC
  Filled 2020-02-01: qty 3

## 2020-02-01 MED ORDER — LACTATED RINGERS IV BOLUS (SEPSIS)
1000.0000 mL | Freq: Once | INTRAVENOUS | Status: AC
  Administered 2020-02-01: 1000 mL via INTRAVENOUS

## 2020-02-01 MED ORDER — ROCURONIUM BROMIDE 10 MG/ML (PF) SYRINGE
PREFILLED_SYRINGE | INTRAVENOUS | Status: AC
  Filled 2020-02-01: qty 10

## 2020-02-01 MED ORDER — FENTANYL CITRATE (PF) 250 MCG/5ML IJ SOLN
INTRAMUSCULAR | Status: AC
  Filled 2020-02-01: qty 5

## 2020-02-01 MED ORDER — EPHEDRINE SULFATE 50 MG/ML IJ SOLN
INTRAMUSCULAR | Status: DC | PRN
  Administered 2020-02-01: 10 mg via INTRAVENOUS

## 2020-02-01 SURGICAL SUPPLY — 27 items
BNDG GAUZE ELAST 4 BULKY (GAUZE/BANDAGES/DRESSINGS) ×3 IMPLANT
BRIEF STRETCH FOR OB PAD XXL (UNDERPADS AND DIAPERS) ×3 IMPLANT
CANISTER SUCT 3000ML PPV (MISCELLANEOUS) IMPLANT
COVER SURGICAL LIGHT HANDLE (MISCELLANEOUS) ×3 IMPLANT
DRAIN PENROSE 1/2X12 LTX STRL (WOUND CARE) IMPLANT
DRAPE LAPAROTOMY 100X72X124 (DRAPES) ×3 IMPLANT
DRSG PAD ABDOMINAL 8X10 ST (GAUZE/BANDAGES/DRESSINGS) ×6 IMPLANT
ELECT REM PT RETURN 9FT ADLT (ELECTROSURGICAL) ×3
ELECTRODE REM PT RTRN 9FT ADLT (ELECTROSURGICAL) ×1 IMPLANT
GAUZE SPONGE 4X4 12PLY STRL (GAUZE/BANDAGES/DRESSINGS) ×3 IMPLANT
GLOVE SURG SYN 7.5  E (GLOVE) ×8
GLOVE SURG SYN 7.5 E (GLOVE) ×4 IMPLANT
GOWN STRL REUS W/ TWL LRG LVL3 (GOWN DISPOSABLE) ×1 IMPLANT
GOWN STRL REUS W/ TWL XL LVL3 (GOWN DISPOSABLE) ×2 IMPLANT
GOWN STRL REUS W/TWL LRG LVL3 (GOWN DISPOSABLE) ×2
GOWN STRL REUS W/TWL XL LVL3 (GOWN DISPOSABLE) ×4
KIT BASIN OR (CUSTOM PROCEDURE TRAY) ×3 IMPLANT
KIT TURNOVER KIT B (KITS) ×3 IMPLANT
NEEDLE HYPO 25GX1X1/2 BEV (NEEDLE) ×3 IMPLANT
NS IRRIG 1000ML POUR BTL (IV SOLUTION) ×3 IMPLANT
PACK GENERAL/GYN (CUSTOM PROCEDURE TRAY) ×3 IMPLANT
PAD ARMBOARD 7.5X6 YLW CONV (MISCELLANEOUS) ×6 IMPLANT
PENCIL SMOKE EVACUATOR (MISCELLANEOUS) ×3 IMPLANT
SPONGE LAP 4X18 RFD (DISPOSABLE) ×6 IMPLANT
SYR CONTROL 10ML LL (SYRINGE) ×3 IMPLANT
TOWEL GREEN STERILE (TOWEL DISPOSABLE) ×3 IMPLANT
TOWEL GREEN STERILE FF (TOWEL DISPOSABLE) ×3 IMPLANT

## 2020-02-01 NOTE — Progress Notes (Signed)
Pharmacy Antibiotic Note  Peter [name] is a 59 y.o. male admitted on 01/31/2020 with gluteal abscesses .  Pharmacy has been consulted for Vancomycin and Meropenem dosing.  Vancomycin 1500 mg IV given at 0430  Plan: Meropenem 500 mg IV q12h F/U renal function and redose Vancomycin as indicated  Height: 5\' 10"  (177.8 cm) Weight: 75.3 kg (166 lb) IBW/kg (Calculated) : 73  Temp (24hrs), Avg:98.1 F (36.7 C), Min:97.8 F (36.6 C), Max:98.6 F (37 C)  Recent Labs  Lab 01/31/20 1608 02/01/20 0220  WBC 22.6*  --   CREATININE 6.00*  --   LATICACIDVEN  --  1.8    Estimated Creatinine Clearance: 13.7 mL/min (A) (by C-G formula based on SCr of 6 mg/dL (H)).    No Known Allergies   Caryl Pina 02/01/2020 6:48 AM

## 2020-02-01 NOTE — Progress Notes (Signed)
Pt transported off floor to O.R.
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
## 2020-02-01 NOTE — Transfer of Care (Signed)
Immediate Anesthesia Transfer of Care Note  Patient: [name]  Procedure(s) Performed: IRRIGATION AND DEBRIDEMENT ABSCESS (N/A Buttocks)  Patient Location: PACU  Anesthesia Type:General  Level of Consciousness: awake  Airway & Oxygen Therapy: Patient Spontanous Breathing and Patient connected to face mask oxygen  Post-op Assessment: Report given to RN and Post -op Vital signs reviewed and stable  Post vital signs: Reviewed and stable  Last Vitals:  Vitals Value Taken Time  BP    Temp    Pulse 95 02/01/20 1157  Resp 20 02/01/20 1157  SpO2 100 % 02/01/20 1157  Vitals shown include unvalidated device data.  Last Pain:  Vitals:   02/01/20 0901  TempSrc: Oral  PainSc: 0-No pain         Complications: No complications documented.

## 2020-02-01 NOTE — Progress Notes (Signed)
Pt arrived back to unit after being in O.R. He is awake and alert, resting comfortably. Denies any pain or discomfort at this time.

## 2020-02-01 NOTE — Progress Notes (Signed)
Pt arrived to unit from ED via stretcher. He is alert/oriented x 4. Denies any pain or discomfort at this time. Oriented to room, plan of care and sequence of events. Skin checked by 2 RN's. Tele box placed and verified with telemetry monitoring. Spouse at bedside.
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
## 2020-02-01 NOTE — ED Notes (Signed)
Attempted report 

## 2020-02-01 NOTE — Progress Notes (Signed)
Pt comfortable at this time. Denies any pain or need for pain meds. States "my butt feels fine".

## 2020-02-01 NOTE — Anesthesia Procedure Notes (Signed)
Procedure Name: Intubation Date/Time: 02/01/2020 11:19 AM Performed by: Clovis Cao, CRNA Pre-anesthesia Checklist: Patient identified, Emergency Drugs available, Suction available, Patient being monitored and Timeout performed Patient Re-evaluated:Patient Re-evaluated prior to induction Oxygen Delivery Method: Circle system utilized Preoxygenation: Pre-oxygenation with 100% oxygen Induction Type: IV induction, Rapid sequence and Cricoid Pressure applied Laryngoscope Size: Miller and 2 Grade View: Grade I Tube type: Oral Tube size: 7.0 mm Number of attempts: 1 Airway Equipment and Method: Stylet Placement Confirmation: ETT inserted through vocal cords under direct vision,  positive ETCO2 and breath sounds checked- equal and bilateral Secured at: 22 cm Tube secured with: Tape Dental Injury: Teeth and Oropharynx as per pre-operative assessment

## 2020-02-01 NOTE — ED Notes (Addendum)
Per Dr. Joycelyn Man pt NPO.

## 2020-02-01 NOTE — ED Notes (Signed)
Patient transported to CT 

## 2020-02-01 NOTE — Anesthesia Postprocedure Evaluation (Signed)
Anesthesia Post Note  Patient: Peter [name]  Procedure(s) Performed: IRRIGATION AND DEBRIDEMENT ABSCESS (N/A Buttocks)     Patient location during evaluation: PACU Anesthesia Type: General Level of consciousness: awake and alert Pain management: pain level controlled Vital Signs Assessment: post-procedure vital signs reviewed and stable Respiratory status: spontaneous breathing, nonlabored ventilation, respiratory function stable and patient connected to nasal cannula oxygen Cardiovascular status: stable and blood pressure returned to baseline Postop Assessment: no apparent nausea or vomiting Anesthetic complications: no   No complications documented.  Last Vitals:  Vitals:   02/01/20 1228 02/01/20 1516  BP: 119/62 125/68  Pulse: 98 95  Resp: 20 19  Temp: 36.4 C 36.4 C  SpO2: 99% 96%    Last Pain:  Vitals:   02/01/20 1516  TempSrc: Oral  PainSc:                  Peter [name]
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
## 2020-02-01 NOTE — ED Notes (Signed)
Pt's wound on buttocks cleansed with betadine and dressed with abd pad per Dr. Josem Kaufmann.

## 2020-02-01 NOTE — Consult Note (Signed)
WOC Nurse Consult Note: Reason for Consult: Buttock lesions, abscess formation with spontaneous drainage in ED. Wound type:Infectious Pressure Injury POA: N/A  I have communicated with Dr. Pietro Cassis via [place] that I supported a consultation with CCS as these lesions are outside the scope of [place] nursing. While the ED provider gave guidance for topical care using betadine swabstick application and dry dressings used in conjunction with heat to facilitate drainage, I recommended deferring to CCS for topical care post intervention.  [place] nursing team will not follow, but will remain available to this patient, the nursing and medical teams.  Please re-consult if needed. Thanks, Maudie Flakes, MSN, RN, [place], [facility]  Pager# [phone]

## 2020-02-01 NOTE — Op Note (Signed)
01/31/2020 - 02/01/2020  11:41 AM  PATIENT:  Peter [name], 59 y.o., male, MRN: [id number]  PREOP DIAGNOSIS:  Multiple buttocks abscess - 3 on the left and 2 on the right  POSTOP DIAGNOSIS:   Multiple buttocks abscess - 3 on the left and 2 on the right  PROCEDURE:   Procedure(s):  IRRIGATION AND DEBRIDEMENT of multiple buttocks ABSCESS  SURGEON:   Alphonsa Overall, M.D.  ASSISTANT:   None  ANESTHESIA:   general  Anesthesiologist: Oleta Mouse, MD CRNA: Clovis Cao, CRNA  General  EBL:  minimal  ml  BLOOD ADMINISTERED: none  DRAINS: none   LOCAL MEDICATIONS USED:   None  SPECIMEN:   Cultures  COUNTS CORRECT:  YES  INDICATIONS FOR PROCEDURE:  Peter [name] is a 58 y.o. (DOB: 18-Aug-1960) white male whose primary care physician is Raelene Bott, MD and comes for I&D of multiple buttocks abscesses.   The indications and risks of the surgery were explained to the patient.  The risks include, but are not limited to, infection, bleeding, and nerve injury.  PROCEDURE: The patient was taken to room #2 at [place].  He was placed in the prone position.  His buttocks was prepped with Betadine and sterilely dress.  A time out was held and the surgical checklist run.  He had 5 abscess tracks:  3 on the left and 2 on the right.  I did an incision and drainage of each abscess.  On the left side he had an abscess/necrotic tissue that was from cranial to caudad - 3 cm, 3 cm, and 6 cm.  On the right side he had an abscess/necrotic tissue that was from cranial to caudad - 3 cm and 5 cm.  I did obtain cultures from one abscess.  I irrigated the abscess cavities with saline and packed them with 4 inch Kerlex.  The wounds were then dressed.  The patient was transferred to the recovery room in good condition.  Alphonsa Overall, MD, [facility] Surgery Office phone:  [phone]

## 2020-02-01 NOTE — ED Notes (Signed)
Pt has complaints of abscesses on bottox going on the past week. Three opened areas assessed with yellowish/green and bloody drainage. One area on bottom was black and necrotic. Red rash assessed on pt back, right hip, down legs and perineal area. No fever assessed at this time.

## 2020-02-01 NOTE — Anesthesia Preprocedure Evaluation (Signed)
Anesthesia Evaluation  Patient identified by MRN, date of birth, ID band Patient awake    Reviewed: Allergy & Precautions, NPO status , Patient's Chart, lab work & pertinent test results  History of Anesthesia Complications Negative for: history of anesthetic complications  Airway Mallampati: III  TM Distance: >3 FB Neck ROM: Full    Dental  (+) Dental Advisory Given   Pulmonary neg pulmonary ROS, neg recent URI,    breath sounds clear to auscultation       Cardiovascular hypertension, Pt. on medications (-) angina(-) Past MI and (-) CHF (-) dysrhythmias  Rhythm:Regular     Neuro/Psych neg Seizures  Neuromuscular disease negative psych ROS   GI/Hepatic negative GI ROS, Neg liver ROS,   Endo/Other  diabetes, Type 2  Renal/GU ARF and CRFRenal disease     Musculoskeletal   Abdominal   Peds  Hematology  (+) Blood dyscrasia, anemia ,   Anesthesia Other Findings   Reproductive/Obstetrics                             Anesthesia Physical Anesthesia Plan  ASA: III  Anesthesia Plan: General   Post-op Pain Management:    Induction: Intravenous, Rapid sequence and Cricoid pressure planned  PONV Risk Score and Plan: 2 and Ondansetron, Treatment may vary due to age or medical condition and Midazolam  Airway Management Planned: Oral ETT  Additional Equipment: None  Intra-op Plan:   Post-operative Plan: Extubation in OR  Informed Consent: I have reviewed the patients History and Physical, chart, labs and discussed the procedure including the risks, benefits and alternatives for the proposed anesthesia with the patient or authorized representative who has indicated his/her understanding and acceptance.     Dental advisory given  Plan Discussed with: CRNA and Surgeon  Anesthesia Plan Comments:         Anesthesia Quick Evaluation

## 2020-02-02 ENCOUNTER — Inpatient Hospital Stay (HOSPITAL_COMMUNITY): Payer: BC Managed Care – PPO

## 2020-02-02 DIAGNOSIS — I351 Nonrheumatic aortic (valve) insufficiency: Secondary | ICD-10-CM

## 2020-02-02 LAB — CBC WITH DIFFERENTIAL/PLATELET
Abs Immature Granulocytes: 0.18 10*3/uL — ABNORMAL HIGH (ref 0.00–0.07)
Basophils Absolute: 0 10*3/uL (ref 0.0–0.1)
Basophils Relative: 0 %
Eosinophils Absolute: 0.2 10*3/uL (ref 0.0–0.5)
Eosinophils Relative: 1 %
HCT: 25.8 % — ABNORMAL LOW (ref 39.0–52.0)
Hemoglobin: 8.4 g/dL — ABNORMAL LOW (ref 13.0–17.0)
Immature Granulocytes: 1 %
Lymphocytes Relative: 2 %
Lymphs Abs: 0.4 10*3/uL — ABNORMAL LOW (ref 0.7–4.0)
MCH: 29.6 pg (ref 26.0–34.0)
MCHC: 32.6 g/dL (ref 30.0–36.0)
MCV: 90.8 fL (ref 80.0–100.0)
Monocytes Absolute: 1.8 10*3/uL — ABNORMAL HIGH (ref 0.1–1.0)
Monocytes Relative: 9 %
Neutro Abs: 18.7 10*3/uL — ABNORMAL HIGH (ref 1.7–7.7)
Neutrophils Relative %: 87 %
Platelets: 340 10*3/uL (ref 150–400)
RBC: 2.84 MIL/uL — ABNORMAL LOW (ref 4.22–5.81)
RDW: 12.9 % (ref 11.5–15.5)
WBC: 21.4 10*3/uL — ABNORMAL HIGH (ref 4.0–10.5)
nRBC: 0 % (ref 0.0–0.2)

## 2020-02-02 LAB — BLOOD CULTURE ID PANEL (REFLEXED) - BCID2

## 2020-02-02 LAB — ECHOCARDIOGRAM COMPLETE
Area-P 1/2: 3.27 cm2
Height: 70 in
S' Lateral: 2.8 cm
Weight: 2550.28 oz

## 2020-02-02 LAB — COMPREHENSIVE METABOLIC PANEL
ALT: 29 U/L (ref 0–44)
AST: 26 U/L (ref 15–41)
Albumin: 2 g/dL — ABNORMAL LOW (ref 3.5–5.0)
Alkaline Phosphatase: 84 U/L (ref 38–126)
Anion gap: 13 (ref 5–15)
BUN: 96 mg/dL — ABNORMAL HIGH (ref 6–20)
CO2: 17 mmol/L — ABNORMAL LOW (ref 22–32)
Calcium: 7.9 mg/dL — ABNORMAL LOW (ref 8.9–10.3)
Chloride: 106 mmol/L (ref 98–111)
Creatinine, Ser: 6.28 mg/dL — ABNORMAL HIGH (ref 0.61–1.24)
GFR calc Af Amer: 10 mL/min — ABNORMAL LOW (ref >60)
GFR calc non Af Amer: 9 mL/min — ABNORMAL LOW (ref >60)
Glucose, Bld: 156 mg/dL — ABNORMAL HIGH (ref 70–99)
Potassium: 4.5 mmol/L (ref 3.5–5.1)
Sodium: 136 mmol/L (ref 135–145)
Total Bilirubin: 0.4 mg/dL (ref 0.3–1.2)
Total Protein: 5.5 g/dL — ABNORMAL LOW (ref 6.5–8.1)

## 2020-02-02 LAB — GLUCOSE, CAPILLARY
Glucose-Capillary: 152 mg/dL — ABNORMAL HIGH (ref 70–99)
Glucose-Capillary: 174 mg/dL — ABNORMAL HIGH (ref 70–99)
Glucose-Capillary: 147 mg/dL — ABNORMAL HIGH (ref 70–99)
Glucose-Capillary: 208 mg/dL — ABNORMAL HIGH (ref 70–99)

## 2020-02-02 LAB — MAGNESIUM: Magnesium: 1.5 mg/dL — ABNORMAL LOW (ref 1.7–2.4)

## 2020-02-02 LAB — VANCOMYCIN, RANDOM: Vancomycin Rm: 19

## 2020-02-02 LAB — MRSA PCR SCREENING: MRSA by PCR: NEGATIVE

## 2020-02-02 MED ORDER — HEPARIN SODIUM (PORCINE) 5000 UNIT/ML IJ SOLN
5000.0000 [IU] | Freq: Three times a day (TID) | INTRAMUSCULAR | Status: DC
  Administered 2020-02-02 – 2020-02-05 (×9): 5000 [IU] via SUBCUTANEOUS
  Filled 2020-02-02 (×9): qty 1

## 2020-02-02 MED ORDER — SODIUM BICARBONATE 8.4 % IV SOLN
INTRAVENOUS | Status: DC
  Filled 2020-02-02 (×2): qty 150

## 2020-02-02 MED ORDER — ALUM & MAG HYDROXIDE-SIMETH 200-200-20 MG/5ML PO SUSP
30.0000 mL | Freq: Four times a day (QID) | ORAL | Status: AC | PRN
  Administered 2020-02-02: 30 mL via ORAL
  Filled 2020-02-02: qty 30

## 2020-02-02 MED ORDER — MAGNESIUM SULFATE 2 GM/50ML IV SOLN
2.0000 g | Freq: Once | INTRAVENOUS | Status: AC
  Administered 2020-02-02: 2 g via INTRAVENOUS
  Filled 2020-02-02: qty 50

## 2020-02-02 MED ORDER — SODIUM BICARBONATE 8.4 % IV SOLN
INTRAVENOUS | Status: DC
  Filled 2020-02-02: qty 150

## 2020-02-02 MED ORDER — SODIUM BICARBONATE-DEXTROSE 150-5 MEQ/L-% IV SOLN
150.0000 meq | INTRAVENOUS | Status: DC
  Administered 2020-02-02 – 2020-02-04 (×4): 150 meq via INTRAVENOUS
  Filled 2020-02-02 (×7): qty 1000

## 2020-02-02 MED ORDER — SODIUM CHLORIDE 0.9 % IV SOLN
500.0000 mg | INTRAVENOUS | Status: DC
  Administered 2020-02-02: 500 mg via INTRAVENOUS
  Filled 2020-02-02 (×2): qty 10

## 2020-02-02 NOTE — Plan of Care (Signed)
?  Problem: Clinical Measurements: ?Goal: Ability to maintain clinical measurements within normal limits will improve ?Outcome: Progressing ?Goal: Will remain free from infection ?Outcome: Progressing ?Goal: Diagnostic test results will improve ?Outcome: Progressing ?  ?

## 2020-02-02 NOTE — Progress Notes (Signed)
PHARMACY - PHYSICIAN COMMUNICATION CRITICAL VALUE ALERT - BLOOD CULTURE IDENTIFICATION (BCID)  Peter [name] is an 59 y.o. male who presented to [facility] on 01/31/2020 with a chief complaint of multiple abscesses  Assessment:  WBC markedly elevated, temp ok, noted renal dysfunction  Name of physician (or Provider) Contacted: X Blount   Current antibiotics: Vancomycin/Merrem  Changes to prescribed antibiotics recommended:  Cont vancomycin for MRSA bacteremia Cont Merrem for gram negative rods on 8/14 wound culture  Results for orders placed or performed during the hospital encounter of 01/31/20  Blood Culture ID Panel (Reflexed) (Collected: 02/01/2020  4:48 AM)  Result Value Ref Range   Enterococcus faecalis NOT DETECTED NOT DETECTED   Enterococcus Faecium NOT DETECTED NOT DETECTED   Listeria monocytogenes NOT DETECTED NOT DETECTED   Staphylococcus species DETECTED (A) NOT DETECTED   Staphylococcus aureus (BCID) DETECTED (A) NOT DETECTED   Staphylococcus epidermidis NOT DETECTED NOT DETECTED   Staphylococcus lugdunensis NOT DETECTED NOT DETECTED   Streptococcus species NOT DETECTED NOT DETECTED   Streptococcus agalactiae NOT DETECTED NOT DETECTED   Streptococcus pneumoniae NOT DETECTED NOT DETECTED   Streptococcus pyogenes NOT DETECTED NOT DETECTED   A.calcoaceticus-baumannii NOT DETECTED NOT DETECTED   Bacteroides fragilis NOT DETECTED NOT DETECTED   Enterobacterales NOT DETECTED NOT DETECTED   Enterobacter cloacae complex NOT DETECTED NOT DETECTED   Escherichia coli NOT DETECTED NOT DETECTED   Klebsiella aerogenes NOT DETECTED NOT DETECTED   Klebsiella oxytoca NOT DETECTED NOT DETECTED   Klebsiella pneumoniae NOT DETECTED NOT DETECTED   Proteus species NOT DETECTED NOT DETECTED   Salmonella species NOT DETECTED NOT DETECTED   Serratia marcescens NOT DETECTED NOT DETECTED   Haemophilus influenzae NOT DETECTED NOT DETECTED   Neisseria meningitidis NOT DETECTED NOT DETECTED    Pseudomonas aeruginosa NOT DETECTED NOT DETECTED   Stenotrophomonas maltophilia NOT DETECTED NOT DETECTED   Candida albicans NOT DETECTED NOT DETECTED   Candida auris NOT DETECTED NOT DETECTED   Candida glabrata NOT DETECTED NOT DETECTED   Candida krusei NOT DETECTED NOT DETECTED   Candida parapsilosis NOT DETECTED NOT DETECTED   Candida tropicalis NOT DETECTED NOT DETECTED   Cryptococcus neoformans/gattii NOT DETECTED NOT DETECTED   Meth resistant mecA/C and MREJ DETECTED (A) NOT DETECTED   Narda Bonds, PharmD, BCPS Clinical Pharmacist Phone: [phone]

## 2020-02-02 NOTE — Progress Notes (Signed)
°  Echocardiogram 2D Echocardiogram has been performed.  Peter [name] 02/02/2020, 2:50 PM

## 2020-02-02 NOTE — Consult Note (Signed)
[facility] for Infectious Disease       Reason for Consult: MRSA bacteremia    Referring Physician: CHAMP autoconsult  Principal Problem:   Cellulitis and abscess of buttock Active Problems:   Uncontrolled type 2 diabetes mellitus with diabetic nephropathy, without long-term current use of insulin (HCC)   Essential hypertension   Mixed diabetic hyperlipidemia associated with type 2 diabetes mellitus ([place])   Acute renal failure superimposed on stage 4 chronic kidney disease (HCC)   Metabolic acidosis   . insulin aspart  0-15 Units Subcutaneous TID AC & HS  . lidocaine  20 mL Intradermal Once  . metoprolol tartrate  50 mg Oral BID  . simvastatin  20 mg Oral Daily    Recommendations: Stop meropenem Stop vancomycin Start daptomycin Repeat blood cultures TTE   Dr. Baxter Flattery will follow up tomorrow  Assessment: He has developed several buttock abscesses s/p debridement by Dr. Lucia Gaskins on 8/14 and now noted to have bacteremia with MRSA.  Culture from the abscess also c/w Staph aureus with no other growth.  No indication for further gram negative coverage so I have stopped meropenem.   Acute on chronic kidney disease so will change vancomycin to daptomycin.    Antibiotics: Vancomycin and meropenem  HPI: Peter [name] is a 59 y.o. male with DM with a Hgb A1c of 7.4, presented with gluteal abscess, s/p debridement.  Culture in blood and abscess with Staph aureus.  He was having chills, no fever.  Leukocytosis on admission.  He has had progressive chronic kidney disease with acute failure on admission.  He feels better since admission though having issues with reflux.    Review of Systems:  Constitutional: negative for fevers, chills, fatigue and malaise Gastrointestinal: negative for diarrhea Integument/breast: negative for rash All other systems reviewed and are negative    Past Medical History:  Diagnosis Date  . Diabetes mellitus without complication ([place])   . High  cholesterol   . Multiple closed anterior-posterior compression fractures of pelvis ([place]) 02/08/2017    Social History   Tobacco Use  . Smoking status: Never Smoker  . Smokeless tobacco: Never Used  Substance Use Topics  . Alcohol use: No  . Drug use: No    Family History  Problem Relation Age of Onset  . Heart disease Mother   . Diabetes Mother   . Renal Disease Mother     No Known Allergies  Physical Exam: Constitutional: in no apparent distress  Vitals:   02/02/20 0501 02/02/20 1100  BP: (!) 162/80 (!) 142/84  Pulse: 79 84  Resp: 16 16  Temp: 97.9 F (36.6 C) 98 F (36.7 C)  SpO2: 99% 100%   EYES: anicteric Cardiovascular: Cor RRR Respiratory: clear; Musculoskeletal: no pedal edema noted Skin: negatives: no rash Neuro: non-focal  Lab Results  Component Value Date   WBC 21.4 (H) 02/02/2020   HGB 8.4 (L) 02/02/2020   HCT 25.8 (L) 02/02/2020   MCV 90.8 02/02/2020   PLT 340 02/02/2020    Lab Results  Component Value Date   CREATININE 6.28 (H) 02/02/2020   BUN 96 (H) 02/02/2020   NA 136 02/02/2020   K 4.5 02/02/2020   CL 106 02/02/2020   CO2 17 (L) 02/02/2020    Lab Results  Component Value Date   ALT 29 02/02/2020   AST 26 02/02/2020   ALKPHOS 84 02/02/2020     Microbiology: Recent Results (from the past 240 hour(s))  Blood Culture (routine x 2)  Status: None (Preliminary result)   Collection Time: 02/01/20  3:04 AM   Specimen: BLOOD RIGHT FOREARM  Result Value Ref Range Status   Specimen Description BLOOD RIGHT FOREARM  Final   Special Requests   Final    BOTTLES DRAWN AEROBIC AND ANAEROBIC Blood Culture adequate volume   Culture   Final    NO GROWTH 1 DAY Performed at [facility], [place] [address] [place], [place] [address]    Report Status PENDING  Incomplete  SARS Coronavirus 2 by RT PCR (hospital order, performed in [facility] hospital lab)     Status: None   Collection Time: 02/01/20  3:04 AM  Result Value Ref Range Status    SARS Coronavirus 2 NEGATIVE NEGATIVE Final    Comment: (NOTE) SARS-CoV-2 target nucleic acids are NOT DETECTED.  The SARS-CoV-2 RNA is generally detectable in upper and lower respiratory specimens during the acute phase of infection. The lowest concentration of SARS-CoV-2 viral copies this assay can detect is 250 copies / mL. A negative result does not preclude SARS-CoV-2 infection and should not be used as the sole basis for treatment or other patient management decisions.  A negative result may occur with improper specimen collection / handling, submission of specimen other than nasopharyngeal swab, presence of viral mutation(s) within the areas targeted by this assay, and inadequate number of viral copies (<250 copies / mL). A negative result must be combined with clinical observations, patient history, and epidemiological information.  Fact Sheet for Patients:   StrictlyIdeas.no  Fact Sheet for Healthcare Providers: BankingDealers.co.za  This test is not yet approved or  cleared by the [place] FDA and has been authorized for detection and/or diagnosis of SARS-CoV-2 by FDA under an Emergency Use Authorization (EUA).  This EUA will remain in effect (meaning this test can be used) for the duration of the COVID-19 declaration under Section 564(b)(1) of the Act, 21 U.S.C. section 360bbb-3(b)(1), unless the authorization is terminated or revoked sooner.  Performed at [facility], [place] [address] [place], [place] [address]   Wound or Superficial Culture     Status: None (Preliminary result)   Collection Time: 02/01/20  4:13 AM   Specimen: Buttocks; Wound  Result Value Ref Range Status   Specimen Description BUTTOCKS  Final   Special Requests NONE  Final   Gram Stain   Final    NO WBC SEEN ABUNDANT GRAM POSITIVE COCCI RARE GRAM NEGATIVE RODS RARE GRAM POSITIVE RODS Performed at [facility], [address], [place], [place] [address]    Culture ABUNDANT STAPHYLOCOCCUS AUREUS  Final   Report Status PENDING  Incomplete  Blood Culture (routine x 2)     Status: None (Preliminary result)   Collection Time: 02/01/20  4:48 AM   Specimen: BLOOD  Result Value Ref Range Status   Specimen Description BLOOD RIGHT ANTECUBITAL  Final   Special Requests   Final    BOTTLES DRAWN AEROBIC AND ANAEROBIC Blood Culture adequate volume   Culture  Setup Time   Final    GRAM POSITIVE COCCI IN CLUSTERS IN BOTH AEROBIC AND ANAEROBIC BOTTLES CRITICAL RESULT CALLED TO, READ BACK BY AND VERIFIED WITH: J. LEDFORD,PHARMD 0018 02/02/2020 Mena Goes Performed at [facility], [place] [address] [place], [place] [address]    Culture [facility] POSITIVE COCCI  Final   Report Status PENDING  Incomplete  Blood Culture ID Panel (Reflexed)     Status: Abnormal   Collection  Time: 02/01/20  4:48 AM  Result Value Ref Range Status   Enterococcus faecalis NOT DETECTED NOT DETECTED Final   Enterococcus Faecium NOT DETECTED NOT DETECTED Final   Listeria monocytogenes NOT DETECTED NOT DETECTED Final   Staphylococcus species DETECTED (A) NOT DETECTED Final    Comment: CRITICAL RESULT CALLED TO, READ BACK BY AND VERIFIED WITH: J. LEDFORD,PHARMD 0018 02/02/2020 T. TYSOR    Staphylococcus aureus (BCID) DETECTED (A) NOT DETECTED Final    Comment: Methicillin (oxacillin)-resistant Staphylococcus aureus (MRSA). MRSA is predictably resistant to beta-lactam antibiotics (except ceftaroline). Preferred therapy is vancomycin unless clinically contraindicated. Patient requires contact precautions if  hospitalized. CRITICAL RESULT CALLED TO, READ BACK BY AND VERIFIED WITH: J. LEDFORD,PHARMD 0018 02/02/2020 T. TYSOR    Staphylococcus epidermidis NOT DETECTED NOT DETECTED Final   Staphylococcus lugdunensis NOT DETECTED NOT DETECTED Final   Streptococcus species NOT DETECTED NOT DETECTED Final   Streptococcus agalactiae NOT DETECTED NOT DETECTED  Final   Streptococcus pneumoniae NOT DETECTED NOT DETECTED Final   Streptococcus pyogenes NOT DETECTED NOT DETECTED Final   A.calcoaceticus-baumannii NOT DETECTED NOT DETECTED Final   Bacteroides fragilis NOT DETECTED NOT DETECTED Final   Enterobacterales NOT DETECTED NOT DETECTED Final   Enterobacter cloacae complex NOT DETECTED NOT DETECTED Final   Escherichia coli NOT DETECTED NOT DETECTED Final   Klebsiella aerogenes NOT DETECTED NOT DETECTED Final   Klebsiella oxytoca NOT DETECTED NOT DETECTED Final   Klebsiella pneumoniae NOT DETECTED NOT DETECTED Final   Proteus species NOT DETECTED NOT DETECTED Final   Salmonella species NOT DETECTED NOT DETECTED Final   Serratia marcescens NOT DETECTED NOT DETECTED Final   Haemophilus influenzae NOT DETECTED NOT DETECTED Final   Neisseria meningitidis NOT DETECTED NOT DETECTED Final   Pseudomonas aeruginosa NOT DETECTED NOT DETECTED Final   Stenotrophomonas maltophilia NOT DETECTED NOT DETECTED Final   Candida albicans NOT DETECTED NOT DETECTED Final   Candida auris NOT DETECTED NOT DETECTED Final   Candida glabrata NOT DETECTED NOT DETECTED Final   Candida krusei NOT DETECTED NOT DETECTED Final   Candida parapsilosis NOT DETECTED NOT DETECTED Final   Candida tropicalis NOT DETECTED NOT DETECTED Final   Cryptococcus neoformans/gattii NOT DETECTED NOT DETECTED Final   Meth resistant mecA/C and MREJ DETECTED (A) NOT DETECTED Final    Comment: CRITICAL RESULT CALLED TO, READ BACK BY AND VERIFIED WITH: J. LEDFORD,PHARMD 0018 02/02/2020 T. TYSOR Performed at [facility], [address] [place], [place] [address]   Aerobic/Anaerobic Culture (surgical/deep wound)     Status: None (Preliminary result)   Collection Time: 02/01/20 11:31 AM   Specimen: Buttocks; Abscess  Result Value Ref Range Status   Specimen Description ABSCESS  Final   Special Requests BUTTOCKS  Final   Gram Stain   Final    RARE WBC PRESENT, PREDOMINANTLY PMN FEW GRAM  POSITIVE COCCI Performed at [facility], [place] [address], [place], [place] [address]    Culture MODERATE STAPHYLOCOCCUS AUREUS  Final   Report Status PENDING  Incomplete  MRSA PCR Screening     Status: None   Collection Time: 02/02/20  5:16 AM   Specimen: Nasal Mucosa; Nasopharyngeal  Result Value Ref Range Status   MRSA by PCR NEGATIVE NEGATIVE Final    Comment:        The GeneXpert MRSA Assay (FDA approved for NASAL specimens only), is one component of a comprehensive MRSA colonization surveillance program. It is not intended to diagnose MRSA infection nor to guide or  monitor treatment for MRSA infections. Performed at [facility], [place] [address] [place], [place] [address]     Diego Ulbricht W Razi Hickle, [place] for Infectious Disease [facility] [email or web address] 02/02/2020, 1:05 PM

## 2020-02-02 NOTE — H&P (View-Only) (Signed)
[facility] for Infectious Disease       Reason for Consult: MRSA bacteremia    Referring Physician: CHAMP autoconsult  Principal Problem:   Cellulitis and abscess of buttock Active Problems:   Uncontrolled type 2 diabetes mellitus with diabetic nephropathy, without long-term current use of insulin (HCC)   Essential hypertension   Mixed diabetic hyperlipidemia associated with type 2 diabetes mellitus ([place])   Acute renal failure superimposed on stage 4 chronic kidney disease (HCC)   Metabolic acidosis   . insulin aspart  0-15 Units Subcutaneous TID AC & HS  . lidocaine  20 mL Intradermal Once  . metoprolol tartrate  50 mg Oral BID  . simvastatin  20 mg Oral Daily    Recommendations: Stop meropenem Stop vancomycin Start daptomycin Repeat blood cultures TTE   Dr. Baxter Flattery will follow up tomorrow  Assessment: He has developed several buttock abscesses s/p debridement by Dr. Lucia Gaskins on 8/14 and now noted to have bacteremia with MRSA.  Culture from the abscess also c/w Staph aureus with no other growth.  No indication for further gram negative coverage so I have stopped meropenem.   Acute on chronic kidney disease so will change vancomycin to daptomycin.    Antibiotics: Vancomycin and meropenem  HPI: Peter [name] is a 59 y.o. male with DM with a Hgb A1c of 7.4, presented with gluteal abscess, s/p debridement.  Culture in blood and abscess with Staph aureus.  He was having chills, no fever.  Leukocytosis on admission.  He has had progressive chronic kidney disease with acute failure on admission.  He feels better since admission though having issues with reflux.    Review of Systems:  Constitutional: negative for fevers, chills, fatigue and malaise Gastrointestinal: negative for diarrhea Integument/breast: negative for rash All other systems reviewed and are negative    Past Medical History:  Diagnosis Date  . Diabetes mellitus without complication ([place])   . High  cholesterol   . Multiple closed anterior-posterior compression fractures of pelvis ([place]) 02/08/2017    Social History   Tobacco Use  . Smoking status: Never Smoker  . Smokeless tobacco: Never Used  Substance Use Topics  . Alcohol use: No  . Drug use: No    Family History  Problem Relation Age of Onset  . Heart disease Mother   . Diabetes Mother   . Renal Disease Mother     No Known Allergies  Physical Exam: Constitutional: in no apparent distress  Vitals:   02/02/20 0501 02/02/20 1100  BP: (!) 162/80 (!) 142/84  Pulse: 79 84  Resp: 16 16  Temp: 97.9 F (36.6 C) 98 F (36.7 C)  SpO2: 99% 100%   EYES: anicteric Cardiovascular: Cor RRR Respiratory: clear; Musculoskeletal: no pedal edema noted Skin: negatives: no rash Neuro: non-focal  Lab Results  Component Value Date   WBC 21.4 (H) 02/02/2020   HGB 8.4 (L) 02/02/2020   HCT 25.8 (L) 02/02/2020   MCV 90.8 02/02/2020   PLT 340 02/02/2020    Lab Results  Component Value Date   CREATININE 6.28 (H) 02/02/2020   BUN 96 (H) 02/02/2020   NA 136 02/02/2020   K 4.5 02/02/2020   CL 106 02/02/2020   CO2 17 (L) 02/02/2020    Lab Results  Component Value Date   ALT 29 02/02/2020   AST 26 02/02/2020   ALKPHOS 84 02/02/2020     Microbiology: Recent Results (from the past 240 hour(s))  Blood Culture (routine x 2)  Status: None (Preliminary result)   Collection Time: 02/01/20  3:04 AM   Specimen: BLOOD RIGHT FOREARM  Result Value Ref Range Status   Specimen Description BLOOD RIGHT FOREARM  Final   Special Requests   Final    BOTTLES DRAWN AEROBIC AND ANAEROBIC Blood Culture adequate volume   Culture   Final    NO GROWTH 1 DAY Performed at [facility], [place] [address], [place], [place] [address]    Report Status PENDING  Incomplete  SARS Coronavirus 2 by RT PCR (hospital order, performed in [facility] hospital lab)     Status: None   Collection Time: 02/01/20  3:04 AM  Result Value Ref Range Status    SARS Coronavirus 2 NEGATIVE NEGATIVE Final    Comment: (NOTE) SARS-CoV-2 target nucleic acids are NOT DETECTED.  The SARS-CoV-2 RNA is generally detectable in upper and lower respiratory specimens during the acute phase of infection. The lowest concentration of SARS-CoV-2 viral copies this assay can detect is 250 copies / mL. A negative result does not preclude SARS-CoV-2 infection and should not be used as the sole basis for treatment or other patient management decisions.  A negative result may occur with improper specimen collection / handling, submission of specimen other than nasopharyngeal swab, presence of viral mutation(s) within the areas targeted by this assay, and inadequate number of viral copies (<250 copies / mL). A negative result must be combined with clinical observations, patient history, and epidemiological information.  Fact Sheet for Patients:   StrictlyIdeas.no  Fact Sheet for Healthcare Providers: BankingDealers.co.za  This test is not yet approved or  cleared by the [place] FDA and has been authorized for detection and/or diagnosis of SARS-CoV-2 by FDA under an Emergency Use Authorization (EUA).  This EUA will remain in effect (meaning this test can be used) for the duration of the COVID-19 declaration under Section 564(b)(1) of the Act, 21 U.S.C. section 360bbb-3(b)(1), unless the authorization is terminated or revoked sooner.  Performed at [facility], [place] [address], [place], [place] [address]   Wound or Superficial Culture     Status: None (Preliminary result)   Collection Time: 02/01/20  4:13 AM   Specimen: Buttocks; Wound  Result Value Ref Range Status   Specimen Description BUTTOCKS  Final   Special Requests NONE  Final   Gram Stain   Final    NO WBC SEEN ABUNDANT GRAM POSITIVE COCCI RARE GRAM NEGATIVE RODS RARE GRAM POSITIVE RODS Performed at [facility], [address], [place], [place] [address]    Culture ABUNDANT STAPHYLOCOCCUS AUREUS  Final   Report Status PENDING  Incomplete  Blood Culture (routine x 2)     Status: None (Preliminary result)   Collection Time: 02/01/20  4:48 AM   Specimen: BLOOD  Result Value Ref Range Status   Specimen Description BLOOD RIGHT ANTECUBITAL  Final   Special Requests   Final    BOTTLES DRAWN AEROBIC AND ANAEROBIC Blood Culture adequate volume   Culture  Setup Time   Final    GRAM POSITIVE COCCI IN CLUSTERS IN BOTH AEROBIC AND ANAEROBIC BOTTLES CRITICAL RESULT CALLED TO, READ BACK BY AND VERIFIED WITH: J. LEDFORD,PHARMD 0018 02/02/2020 Mena Goes Performed at [facility], [place] [address], [place], [place] [address]    Culture [facility] POSITIVE COCCI  Final   Report Status PENDING  Incomplete  Blood Culture ID Panel (Reflexed)     Status: Abnormal   Collection  Time: 02/01/20  4:48 AM  Result Value Ref Range Status   Enterococcus faecalis NOT DETECTED NOT DETECTED Final   Enterococcus Faecium NOT DETECTED NOT DETECTED Final   Listeria monocytogenes NOT DETECTED NOT DETECTED Final   Staphylococcus species DETECTED (A) NOT DETECTED Final    Comment: CRITICAL RESULT CALLED TO, READ BACK BY AND VERIFIED WITH: J. LEDFORD,PHARMD 0018 02/02/2020 T. TYSOR    Staphylococcus aureus (BCID) DETECTED (A) NOT DETECTED Final    Comment: Methicillin (oxacillin)-resistant Staphylococcus aureus (MRSA). MRSA is predictably resistant to beta-lactam antibiotics (except ceftaroline). Preferred therapy is vancomycin unless clinically contraindicated. Patient requires contact precautions if  hospitalized. CRITICAL RESULT CALLED TO, READ BACK BY AND VERIFIED WITH: J. LEDFORD,PHARMD 0018 02/02/2020 T. TYSOR    Staphylococcus epidermidis NOT DETECTED NOT DETECTED Final   Staphylococcus lugdunensis NOT DETECTED NOT DETECTED Final   Streptococcus species NOT DETECTED NOT DETECTED Final   Streptococcus agalactiae NOT DETECTED NOT DETECTED  Final   Streptococcus pneumoniae NOT DETECTED NOT DETECTED Final   Streptococcus pyogenes NOT DETECTED NOT DETECTED Final   A.calcoaceticus-baumannii NOT DETECTED NOT DETECTED Final   Bacteroides fragilis NOT DETECTED NOT DETECTED Final   Enterobacterales NOT DETECTED NOT DETECTED Final   Enterobacter cloacae complex NOT DETECTED NOT DETECTED Final   Escherichia coli NOT DETECTED NOT DETECTED Final   Klebsiella aerogenes NOT DETECTED NOT DETECTED Final   Klebsiella oxytoca NOT DETECTED NOT DETECTED Final   Klebsiella pneumoniae NOT DETECTED NOT DETECTED Final   Proteus species NOT DETECTED NOT DETECTED Final   Salmonella species NOT DETECTED NOT DETECTED Final   Serratia marcescens NOT DETECTED NOT DETECTED Final   Haemophilus influenzae NOT DETECTED NOT DETECTED Final   Neisseria meningitidis NOT DETECTED NOT DETECTED Final   Pseudomonas aeruginosa NOT DETECTED NOT DETECTED Final   Stenotrophomonas maltophilia NOT DETECTED NOT DETECTED Final   Candida albicans NOT DETECTED NOT DETECTED Final   Candida auris NOT DETECTED NOT DETECTED Final   Candida glabrata NOT DETECTED NOT DETECTED Final   Candida krusei NOT DETECTED NOT DETECTED Final   Candida parapsilosis NOT DETECTED NOT DETECTED Final   Candida tropicalis NOT DETECTED NOT DETECTED Final   Cryptococcus neoformans/gattii NOT DETECTED NOT DETECTED Final   Meth resistant mecA/C and MREJ DETECTED (A) NOT DETECTED Final    Comment: CRITICAL RESULT CALLED TO, READ BACK BY AND VERIFIED WITH: J. LEDFORD,PHARMD 0018 02/02/2020 T. TYSOR Performed at [facility], [address] [place], [place] [address]   Aerobic/Anaerobic Culture (surgical/deep wound)     Status: None (Preliminary result)   Collection Time: 02/01/20 11:31 AM   Specimen: Buttocks; Abscess  Result Value Ref Range Status   Specimen Description ABSCESS  Final   Special Requests BUTTOCKS  Final   Gram Stain   Final    RARE WBC PRESENT, PREDOMINANTLY PMN FEW GRAM  POSITIVE COCCI Performed at [facility], [place] [address] [place], [place] [address]    Culture MODERATE STAPHYLOCOCCUS AUREUS  Final   Report Status PENDING  Incomplete  MRSA PCR Screening     Status: None   Collection Time: 02/02/20  5:16 AM   Specimen: Nasal Mucosa; Nasopharyngeal  Result Value Ref Range Status   MRSA by PCR NEGATIVE NEGATIVE Final    Comment:        The GeneXpert MRSA Assay (FDA approved for NASAL specimens only), is one component of a comprehensive MRSA colonization surveillance program. It is not intended to diagnose MRSA infection nor to guide or  monitor treatment for MRSA infections. Performed at [facility], [place] [address], [place], [place] [address]     Sadie Pickar W Nikolaus Pienta, [place] for Infectious Disease [facility] [email or web address] 02/02/2020, 1:05 PM
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
## 2020-02-02 NOTE — Progress Notes (Signed)
Pharmacy Antibiotic Note  Peter [name] is a 59 y.o. male admitted on 01/31/2020 with cellulitis and abscess of buttock. Patient was started on vancomycin and meropenem, and went to OR for I&D and debridement of wound on 8/14. Patient subsequently found to have MRSA bacteremia, and wound cultures also growing Staph aureus. Meropenem was discontinued, and vancomycin was switched to daptomycin due to patient's unstable renal function. Pharmacy has been consulted for daptomycin dosing.  Patient is afebrile, WBC elevated but trending down at 21.4. Patient has AKI, SCr on admission at 6.0 and remains above 6.0 today. CrCl = 13 ml/min with adequate urine output.  After discussing with attending Dr. Pietro Cassis, patient's simvastatin will be held while on daptomycin due to increased risk of myopathy and rhabdomyolysis.   Plan: Daptomycin 500 mg IV q48h Monitor CK weekly on Mondays Monitor cultures, renal function, and clinical improvement ID team following   Height: 5\' 10"  (177.8 cm) Weight: 72.3 kg (159 lb 6.3 oz) IBW/kg (Calculated) : 73  Temp (24hrs), Avg:97.9 F (36.6 C), Min:97.6 F (36.4 C), Max:98 F (36.7 C)  Recent Labs  Lab 01/31/20 1608 02/01/20 0220 02/01/20 0831 02/02/20 0225  WBC 22.6*  --  30.5* 21.4*  CREATININE 6.00*  --  6.93* 6.28*  LATICACIDVEN  --  1.8  --   --   VANCORANDOM  --   --   --  19    Estimated Creatinine Clearance: 13 mL/min (A) (by C-G formula based on SCr of 6.28 mg/dL (H)).    No Known Allergies  Antimicrobials this admission: 8/14 Vanc x1 8/14 Mero >> 8/15 8/14 cefepime x1 8/15 daptomycin >>  Microbiology results: 8/14 wound cx: Gram stain GPC, GNR, GPR>>abundant Staph aureus 8/14 BCx >>2/2 MRSA 8/14 MRSA PCR negative 8/14 abscess cx: Gram stain GPC>>moderate Staph aureus  Thank you for allowing pharmacy to be a part of this patient's care.  Berenice Bouton, PharmD PGY2 Pharmacy Resident Phone between 7 am - 3:30 pm: [phone]  Please  check AMION for all [place] phone numbers After 10:00 PM, call [place] [phone]  02/02/2020 1:46 PM

## 2020-02-02 NOTE — Progress Notes (Signed)
Dressing change on wound buttocks per order, cleansed with betadine, damp to dry dressing, abdominal gauze taped with heat pack. Patient tolerated well.

## 2020-02-02 NOTE — Progress Notes (Signed)
[facility]:  [phone] General Surgery Progress Note   LOS: 1 day  POD -  1 Day Post-Op  Assessment and Plan: 1.  IRRIGATION AND DEBRIDEMENT ABSCESS of multiple buttocks abscesses - 8/14 - Tiajah Oyster  WBC - 21,400 - 02/02/2020  On Merrem/Vanc  To start BID dressing changes - may get in shower to clean up wounds 2.  DM 3.  CKD/AKI -   Creatinine - 6.28 - 02/02/2020 4.  Anemia  Hgb - 8.4 - 02/02/2020 5.  HTN   Principal Problem:   Cellulitis and abscess of buttock Active Problems:   Uncontrolled type 2 diabetes mellitus with diabetic nephropathy, without long-term current use of insulin (HCC)   Essential hypertension   Mixed diabetic hyperlipidemia associated with type 2 diabetes mellitus (HCC)   Acute renal failure superimposed on stage 4 chronic kidney disease (HCC)   Metabolic acidosis  Subjective:  Doing okay.  Minimal buttocks pain (but he had little pain pre op).  Needs to ambulate more.  Objective:   Vitals:   02/02/20 0242 02/02/20 0501  BP: (!) 149/88 (!) 162/80  Pulse: 87 79  Resp: 17 16  Temp: 97.8 F (36.6 C) 97.9 F (36.6 C)  SpO2: 100% 99%     Intake/Output from previous day:  08/14 0701 - 08/15 0700 In: 1520 [P.O.:120; I.V.:1400] Out: 1475 [Urine:1450; Blood:25]  Intake/Output this shift:  No intake/output data recorded.   Physical Exam:   General: WN older WM who is alert and oriented.    Wound: Okay.  To start dressing changes.     Abscesses before I&D on 8/14   Lab Results:    Recent Labs    02/01/20 0831 02/02/20 0225  WBC 30.5* 21.4*  HGB 8.8* 8.4*  HCT 28.3* 25.8*  PLT 328 340    BMET   Recent Labs    02/01/20 0831 02/02/20 0225  NA 132* 136  K 5.6* 4.5  CL 103 106  CO2 12* 17*  GLUCOSE 259* 156*  BUN 99* 96*  CREATININE 6.93* 6.28*  CALCIUM 8.0* 7.9*    PT/INR   Recent Labs    02/01/20 0304  LABPROT 15.3*  INR 1.3*    ABG  No results for input(s): PHART, HCO3 in the last 72  hours.  Invalid input(s): PCO2, PO2   Studies/Results:  CT ABDOMEN PELVIS WO CONTRAST  Result Date: 02/01/2020 CLINICAL DATA:  Abdominal abscess/infection suspected Nausea. Patient reports decreased appetite. Patient reports buttock abscess. EXAM: CT ABDOMEN AND PELVIS WITHOUT CONTRAST TECHNIQUE: Multidetector CT imaging of the abdomen and pelvis was performed following the standard protocol without IV contrast. COMPARISON:  CT 03/06/2017 FINDINGS: Lower chest: Minimal subpleural scarring in the left lower lobe. No acute airspace disease. No pleural effusion. Hepatobiliary: No focal hepatic abnormality on noncontrast exam. Multiple calcified gallstones without pericholecystic inflammation. There is no biliary dilatation. Pancreas: No ductal dilatation or inflammation. Spleen: Normal in size. No focal abnormality on noncontrast exam. Coils again seen in the splenic artery. Adrenals/Urinary Tract: Previous left adrenal hematoma has resolved, there is mild residual adrenal thickening. Normal right adrenal gland. Mild left hydronephrosis and hydroureter to the level of the distal pelvis. No stone or cause for obstruction. Mild prominence of the right ureter without hydronephrosis. There is symmetric bilateral perinephric edema. Urinary bladder is minimally distended. Stomach/Bowel: Small hiatal hernia. Stomach otherwise unremarkable. There is no bowel obstruction. No evidence of bowel wall thickening or inflammation. Few lower pelvic small bowel loops are fluid-filled but nondilated  or inflamed. Normal appendix. High-riding cecum. Moderate volume of colonic stool. There is colonic tortuosity. No colonic wall thickening or inflammation. Vascular/Lymphatic: Abdominal aorta is normal in caliber. Coiling of the splenic artery. Multiple small retroperitoneal lymph nodes, not enlarged by size criteria and likely reactive. Small bilateral inguinal nodes. Reproductive: Prostate unremarkable. Right testis may be within  the inguinal canal, series 3, image 86. Other: No ascites or free air. Subcutaneous fluid collection involving the left gluteal soft tissues about the gluteal crease measuring 2.8 x 1.4 x 4.1 cm. Mild adjacent soft tissue edema tracks into the gluteal fold. Fluid collection in the inferior right gluteal soft tissues abutting the gluteal crease measures 3.0 x 2.4 x 3.8 cm. There is no air within either fluid collection or subcutaneous soft tissues. No extension into the anorectum. Musculoskeletal: Bilateral sacroiliac joint pinning and pubic symphyseal fixation. Remote left transverse process fractures in the lumbar spine. Remote left rib fractures. No acute osseous abnormalities. IMPRESSION: 1. Two gluteal fluid collections, 1 on the right and 1 on the left. Subcutaneous fluid collection involving the left gluteal soft tissues abut the gluteal crease measuring 2.8 x 1.4 x 4.1 cm, suspicious for abscess. Fluid collection in the inferior right gluteal soft tissues abutting the gluteal crease measures 3.0 x 2.4 x 3.8 cm. No air within either fluid collection or subcutaneous soft tissues. 2. Mild left hydronephrosis and hydroureter to the level of the distal pelvis. No stone or cause for obstruction. Bilateral perinephric edema, nonspecific. Decompressed urinary bladder which appears thick walled. 3. Cholelithiasis without gallbladder inflammation. 4. Small hiatal hernia. 5. Right testis may be within the inguinal canal. Recommend correlation with physical exam. Electronically Signed   By: Keith Rake M.D.   On: 02/01/2020 03:39   DG Chest Portable 1 View  Result Date: 02/01/2020 CLINICAL DATA:  Cough. EXAM: PORTABLE CHEST 1 VIEW COMPARISON:  Radiograph 04/01/2017 FINDINGS: Heart is normal in size. Normal mediastinal contours. Remote left rib fractures with minor scarring at the left lung base. No acute or confluent airspace disease. No pleural fluid or pneumothorax. No pulmonary edema. Left upper quadrant  vascular coils. IMPRESSION: 1. No acute abnormality. 2. Remote left rib fractures with minor scarring at the left lung base. Electronically Signed   By: Keith Rake M.D.   On: 02/01/2020 03:41     Anti-infectives:   Anti-infectives (From admission, onward)   Start     Dose/Rate Route Frequency Ordered Stop   02/01/20 0800  meropenem (MERREM) 500 mg in sodium chloride 0.9 % 100 mL IVPB     Discontinue     500 mg 200 mL/hr over 30 Minutes Intravenous Every 12 hours 02/01/20 0656     02/01/20 0655  vancomycin variable dose per unstable renal function (pharmacist dosing)     Discontinue      Does not apply See admin instructions 02/01/20 0656     02/01/20 0300  ceFEPIme (MAXIPIME) 2 g in sodium chloride 0.9 % 100 mL IVPB        2 g 200 mL/hr over 30 Minutes Intravenous  Once 02/01/20 0252 02/01/20 0446   02/01/20 0300  vancomycin (VANCOREADY) IVPB 1500 mg/300 mL        1,500 mg 150 mL/hr over 120 Minutes Intravenous  Once 02/01/20 0252 02/01/20 0624      Alphonsa Overall, MD, [facility] Surgery Office: [phone] 02/02/2020

## 2020-02-03 ENCOUNTER — Inpatient Hospital Stay (HOSPITAL_COMMUNITY): Payer: BC Managed Care – PPO

## 2020-02-03 ENCOUNTER — Encounter (HOSPITAL_COMMUNITY): Payer: Self-pay | Admitting: Surgery

## 2020-02-03 LAB — RENAL FUNCTION PANEL
Albumin: 2 g/dL — ABNORMAL LOW (ref 3.5–5.0)
Anion gap: 12 (ref 5–15)
BUN: 80 mg/dL — ABNORMAL HIGH (ref 6–20)
CO2: 21 mmol/L — ABNORMAL LOW (ref 22–32)
Calcium: 7.7 mg/dL — ABNORMAL LOW (ref 8.9–10.3)
Chloride: 106 mmol/L (ref 98–111)
Creatinine, Ser: 5.24 mg/dL — ABNORMAL HIGH (ref 0.61–1.24)
GFR calc Af Amer: 13 mL/min — ABNORMAL LOW (ref >60)
GFR calc non Af Amer: 11 mL/min — ABNORMAL LOW (ref >60)
Glucose, Bld: 180 mg/dL — ABNORMAL HIGH (ref 70–99)
Phosphorus: 3.7 mg/dL (ref 2.5–4.6)
Potassium: 4 mmol/L (ref 3.5–5.1)
Sodium: 139 mmol/L (ref 135–145)

## 2020-02-03 LAB — PROTEIN / CREATININE RATIO, URINE
Creatinine, Urine: 51.38 mg/dL
Protein Creatinine Ratio: 2.57 mg/mg{Cre} — ABNORMAL HIGH (ref 0.00–0.15)
Total Protein, Urine: 132 mg/dL

## 2020-02-03 LAB — CULTURE, BLOOD (ROUTINE X 2): Special Requests: ADEQUATE

## 2020-02-03 LAB — CREATININE, URINE, RANDOM: Creatinine, Urine: 51.63 mg/dL

## 2020-02-03 LAB — AEROBIC CULTURE  (SUPERFICIAL SPECIMEN)

## 2020-02-03 LAB — GLUCOSE, CAPILLARY
Glucose-Capillary: 204 mg/dL — ABNORMAL HIGH (ref 70–99)
Glucose-Capillary: 244 mg/dL — ABNORMAL HIGH (ref 70–99)
Glucose-Capillary: 125 mg/dL — ABNORMAL HIGH (ref 70–99)
Glucose-Capillary: 194 mg/dL — ABNORMAL HIGH (ref 70–99)

## 2020-02-03 LAB — SODIUM, URINE, RANDOM: Sodium, Ur: 67 mmol/L

## 2020-02-03 LAB — AEROBIC CULTURE W GRAM STAIN (SUPERFICIAL SPECIMEN): Gram Stain: NONE SEEN

## 2020-02-03 LAB — CK: Total CK: 161 U/L (ref 49–397)

## 2020-02-03 MED ORDER — INSULIN GLARGINE 100 UNIT/ML ~~LOC~~ SOLN
5.0000 [IU] | Freq: Every day | SUBCUTANEOUS | Status: DC
  Administered 2020-02-03: 5 [IU] via SUBCUTANEOUS
  Filled 2020-02-03 (×2): qty 0.05

## 2020-02-03 MED ORDER — SODIUM CHLORIDE 0.9 % IV SOLN
600.0000 mg | INTRAVENOUS | Status: DC
  Administered 2020-02-04 – 2020-02-10 (×4): 600 mg via INTRAVENOUS
  Filled 2020-02-03 (×6): qty 12

## 2020-02-03 MED ORDER — SODIUM CHLORIDE 0.9 % IV SOLN
INTRAVENOUS | Status: DC
  Administered 2020-02-04: 500 mL via INTRAVENOUS

## 2020-02-03 NOTE — Progress Notes (Signed)
2 Days Post-Op  Subjective: No new complaints.  Still with some pain around his buttocks.  Getting dressings changed  ROS: See above, otherwise other systems negative  Objective: Vital signs in last 24 hours: Temp:  [98 F (36.7 C)-98.8 F (37.1 C)] 98.6 F (37 C) (08/16 0427) Pulse Rate:  [84-90] 90 (08/16 0427) Resp:  [16-17] 17 (08/16 0427) BP: (142-164)/(84-94) 158/90 (08/16 0427) SpO2:  [100 %] 100 % (08/16 0427) Last BM Date: 01/31/20  Intake/Output from previous day: 08/15 0701 - 08/16 0700 In: 1598.6 [P.O.:597; I.V.:1001.6] Out: 2250 [Urine:2250] Intake/Output this shift: No intake/output data recorded.  PE: Buttock: multiple wounds noted.  All packed.  Some relatively clean, a couple with some fibrinous tissue, and some inferiorly mostly black.  Appears this is more from cautery than necrosis, but difficult to tell.  Induration seems to be improving.  Still somewhat indurated inferiorly.  Some stool present in the inferior wounds around his anus.  Lab Results:  Recent Labs    02/01/20 0831 02/02/20 0225  WBC 30.5* 21.4*  HGB 8.8* 8.4*  HCT 28.3* 25.8*  PLT 328 340   BMET Recent Labs    02/02/20 0225 02/03/20 0402  NA 136 139  K 4.5 4.0  CL 106 106  CO2 17* 21*  GLUCOSE 156* 180*  BUN 96* 80*  CREATININE 6.28* 5.24*  CALCIUM 7.9* 7.7*   PT/INR Recent Labs    02/01/20 0304  LABPROT 15.3*  INR 1.3*   CMP     Component Value Date/Time   NA 139 02/03/2020 0402   K 4.0 02/03/2020 0402   CL 106 02/03/2020 0402   CO2 21 (L) 02/03/2020 0402   GLUCOSE 180 (H) 02/03/2020 0402   BUN 80 (H) 02/03/2020 0402   CREATININE 5.24 (H) 02/03/2020 0402   CALCIUM 7.7 (L) 02/03/2020 0402   PROT 5.5 (L) 02/02/2020 0225   ALBUMIN 2.0 (L) 02/03/2020 0402   AST 26 02/02/2020 0225   ALT 29 02/02/2020 0225   ALKPHOS 84 02/02/2020 0225   BILITOT 0.4 02/02/2020 0225   GFRNONAA 11 (L) 02/03/2020 0402   GFRAA 13 (L) 02/03/2020 0402   Lipase  No results  found for: LIPASE     Studies/Results: ECHOCARDIOGRAM COMPLETE  Result Date: 02/02/2020    ECHOCARDIOGRAM REPORT   Patient Name:   NASRI BOAKYE Date of Exam: 02/02/2020 Medical Rec #:  [id number]        Height:       70.0 in Accession #:    [id number]       Weight:       159.4 lb Date of Birth:  [birth year]       BSA:          1.895 m Patient Age:    59 years         BP:           142/84 mmHg Patient Gender: M                HR:           84 bpm. Exam Location:  Inpatient Procedure: 2D Echo Indications:    bacteremia  History:        Patient has no prior history of Echocardiogram examinations.                 Risk Factors:Diabetes and Hypertension.  Sonographer:    Jannett Celestine RDCS (AE) Referring Phys: [place]  1.  There is a linear independently mobile echodensity in the LVOT (see image 30). This cannot be clearly traced back to a structure, but aortic valve endocarditis not excluded. Left ventricular ejection fraction, by estimation, is 65 to 70%. The left ventricle has normal function. The left ventricle has no regional wall motion abnormalities. Left ventricular diastolic parameters were normal.  2. Right ventricular systolic function is normal. The right ventricular size is normal. Tricuspid regurgitation signal is inadequate for assessing PA pressure.  3. The mitral valve is normal in structure. Trivial mitral valve regurgitation. No evidence of mitral stenosis.  4. Bicuspid aortic valve, with fusion of the RCC and LCC (image 20) with focal calcification. Mild AI. Marland Kitchen The aortic valve is bicuspid. Aortic valve regurgitation is mild. Mild aortic valve sclerosis is present, with no evidence of aortic valve stenosis.  5. The inferior vena cava is normal in size with greater than 50% respiratory variability, suggesting right atrial pressure of 3 mmHg. Comparison(s): No prior Echocardiogram. Conclusion(s)/Recommendation(s): Bicuspid aortic valve. There is an independently mobile  linear echodensity seen in the LVOT. Given bacteremia, would recommend TEE to further evaluate for endocarditis. FINDINGS  Left Ventricle: There is a linear independently mobile echodensity in the LVOT (see image 30). This cannot be clearly traced back to a structure, but aortic valve endocarditis not excluded. Left ventricular ejection fraction, by estimation, is 65 to 70%. The left ventricle has normal function. The left ventricle has no regional wall motion abnormalities. The left ventricular internal cavity size was normal in size. There is borderline left ventricular hypertrophy. Left ventricular diastolic parameters were normal. Right Ventricle: The right ventricular size is normal. No increase in right ventricular wall thickness. Right ventricular systolic function is normal. Tricuspid regurgitation signal is inadequate for assessing PA pressure. Left Atrium: Left atrial size was normal in size. Right Atrium: Right atrial size was normal in size. Pericardium: There is no evidence of pericardial effusion. Mitral Valve: The mitral valve is normal in structure. Trivial mitral valve regurgitation. No evidence of mitral valve stenosis. Tricuspid Valve: The tricuspid valve is normal in structure. Tricuspid valve regurgitation is trivial. No evidence of tricuspid stenosis. Aortic Valve: Bicuspid aortic valve, with fusion of the RCC and LCC (image 20) with focal calcification. Mild AI. The aortic valve is bicuspid. . There is mild thickening and mild calcification of the aortic valve. Aortic valve regurgitation is mild. Mild aortic valve sclerosis is present, with no evidence of aortic valve stenosis. There is mild thickening of the aortic valve. There is mild calcification of the aortic valve. Pulmonic Valve: The pulmonic valve was grossly normal. Pulmonic valve regurgitation is trivial. Aorta: The aortic root was not well visualized, the ascending aorta was not well visualized and the aortic arch was not well  visualized. Venous: The inferior vena cava is normal in size with greater than 50% respiratory variability, suggesting right atrial pressure of 3 mmHg. IAS/Shunts: The atrial septum is grossly normal.  LEFT VENTRICLE PLAX 2D LVIDd:         4.70 cm  Diastology LVIDs:         2.80 cm  LV e' lateral:   7.62 cm/s LV PW:         1.10 cm  LV E/e' lateral: 9.7 LV IVS:        1.20 cm  LV e' medial:    5.98 cm/s LVOT diam:     2.20 cm  LV E/e' medial:  12.4 LV SV:  87 LV SV Index:   46 LVOT Area:     3.80 cm  RIGHT VENTRICLE RV S prime:     9.46 cm/s TAPSE (M-mode): 1.7 cm LEFT ATRIUM           Index       RIGHT ATRIUM          Index LA diam:      3.00 cm 1.58 cm/m  RA Area:     8.89 cm LA Vol (A2C): 20.6 ml 10.87 ml/m RA Volume:   15.00 ml 7.91 ml/m LA Vol (A4C): 21.9 ml 11.55 ml/m  AORTIC VALVE LVOT Vmax:   95.30 cm/s LVOT Vmean:  71.000 cm/s LVOT VTI:    0.229 m  AORTA Ao Root diam: 3.60 cm MITRAL VALVE MV Area (PHT): 3.27 cm     SHUNTS MV Decel Time: 232 msec     Systemic VTI:  0.23 m MV E velocity: 74.10 cm/s   Systemic Diam: 2.20 cm MV A velocity: 102.00 cm/s MV E/A ratio:  0.73 Buford Dresser MD Electronically signed by Buford Dresser MD Signature Date/Time: 02/02/2020/4:39:02 PM    Final     Anti-infectives: Anti-infectives (From admission, onward)   Start     Dose/Rate Route Frequency Ordered Stop   02/02/20 1430  DAPTOmycin (CUBICIN) 500 mg in sodium chloride 0.9 % IVPB     Discontinue     500 mg 220 mL/hr over 30 Minutes Intravenous Every 48 hours 02/02/20 1332     02/01/20 0800  meropenem (MERREM) 500 mg in sodium chloride 0.9 % 100 mL IVPB  Status:  Discontinued        500 mg 200 mL/hr over 30 Minutes Intravenous Every 12 hours 02/01/20 0656 02/02/20 1304   02/01/20 0655  vancomycin variable dose per unstable renal function (pharmacist dosing)  Status:  Discontinued         Does not apply See admin instructions 02/01/20 0656 02/02/20 1305   02/01/20 0300  ceFEPIme  (MAXIPIME) 2 g in sodium chloride 0.9 % 100 mL IVPB        2 g 200 mL/hr over 30 Minutes Intravenous  Once 02/01/20 0252 02/01/20 0446   02/01/20 0300  vancomycin (VANCOREADY) IVPB 1500 mg/300 mL        1,500 mg 150 mL/hr over 120 Minutes Intravenous  Once 02/01/20 0252 02/01/20 0624       Assessment/Plan DM CKD/AKI - Creatinine - 5 Anemia - Hgb - 8.4 - 02/02/2020 HTN MRSA bacteremia  POD 2, s/p IRRIGATION AND DEBRIDEMENT ABSCESS of multiple buttocks abscesses - 8/14 - Newman -cxs reveal staph aureus.  Given MRSA bacteremia, buttock is likely MRSA as well.  abx per ID -WBC down to 21K yesterday.  No labs today.  CBC in am - will order PT hydrotherapy to clean up some of these wounds as he has multiple. -otherwise increase to TID dressing changes -cont to follow     FEN - carb mod VTE - heparin ID - cubicin   LOS: 2 days    Henreitta Cea , [facility] Surgery 02/03/2020, 8:41 AM Please see Amion for pager number during day hours 7:00am-4:30pm or 7:00am -11:30am on weekends

## 2020-02-03 NOTE — Progress Notes (Addendum)
Pharmacy Antibiotic Note  Peter [name] is a 59 y.o. male admitted on 01/31/2020 with cellulitis and abscess of buttock. Patient was started on vancomycin and meropenem, and went to OR for I&D and debridement of wound on 8/14. Patient subsequently found to have MRSA bacteremia, and wound cultures also growing Staph aureus (peliminary result).  Pharmacy has been consulted to switch antibiotics to Cubicin due to unstable renal function.  Patient has an AKI and SCr is improving slowly, CrCL 16 ml/min, afebrile, WBC down to 21.4.   CK WNL at 161; off Zocor due to increased risk of myopathy and rhabdomyolysis.   Plan: Cubicin 500mg  IV Q48H CK qMon Monitor renal fxn, mico data, clinical progress Addendum: Increased Cubicin dose to 600mg  IV q48 to cover for endocarditis.  Height: 5\' 10"  (177.8 cm) Weight: 72.3 kg (159 lb 6.3 oz) IBW/kg (Calculated) : 73  Temp (24hrs), Avg:98.5 F (36.9 C), Min:98 F (36.7 C), Max:98.8 F (37.1 C)  Recent Labs  Lab 01/31/20 1608 02/01/20 0220 02/01/20 0831 02/02/20 0225 02/03/20 0402  WBC 22.6*  --  30.5* 21.4*  --   CREATININE 6.00*  --  6.93* 6.28* 5.24*  LATICACIDVEN  --  1.8  --   --   --   VANCORANDOM  --   --   --  19  --     Estimated Creatinine Clearance: 15.5 mL/min (A) (by C-G formula based on SCr of 5.24 mg/dL (H)).    No Known Allergies  Vanc x1 8/14 Merrem 8/14 >> 8/15 Cefepime x1 8/14 Cubicin 8/15 >>  8/15 VL = 19 mcg/mL 8/16 CK - 161  8/14 buttocks abscess - Staph aureus (pending) 8/14 buttocks - Staph aureus (pending) 8/14 BCx - MRSA 8/15 MRSA PCR - negative 8/16 BCx -    Thuy D. Mina Marble, PharmD, BCPS, [place] 02/03/2020, 10:55 AM

## 2020-02-03 NOTE — Progress Notes (Signed)
    [facility] has been requested to perform a transesophageal echocardiogram on Peter [name] for bacteremia.  After careful review of history and examination, the risks and benefits of transesophageal echocardiogram have been explained including risks of esophageal damage, perforation (1:10,000 risk), bleeding, pharyngeal hematoma as well as other potential complications associated with conscious sedation including aspiration, arrhythmia, respiratory failure and death. Alternatives to treatment were discussed, questions were answered. Patient is willing to proceed.   Tristyn Pharris Ninfa Meeker, PA-C  02/03/2020 3:44 PM
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
## 2020-02-03 NOTE — Progress Notes (Signed)
Adding Contact Precautions per Dr. Baxter Flattery for MRSA in blood and posterior wounds.    Jimmy Footman, PharmD, BCPS, BCIDP Infectious Diseases Clinical Pharmacist Phone: [phone] 02/03/2020 11:23 AM

## 2020-02-04 ENCOUNTER — Inpatient Hospital Stay (HOSPITAL_COMMUNITY): Payer: BC Managed Care – PPO | Admitting: Certified Registered Nurse Anesthetist

## 2020-02-04 ENCOUNTER — Inpatient Hospital Stay (HOSPITAL_COMMUNITY): Payer: BC Managed Care – PPO

## 2020-02-04 ENCOUNTER — Encounter (HOSPITAL_COMMUNITY): Payer: Self-pay | Admitting: Internal Medicine

## 2020-02-04 ENCOUNTER — Encounter (HOSPITAL_COMMUNITY)
Admission: EM | Disposition: A | Discharge status: Home Health | Payer: Self-pay | Source: Home / Self Care | Attending: Internal Medicine

## 2020-02-04 DIAGNOSIS — I10 Essential (primary) hypertension: Secondary | ICD-10-CM

## 2020-02-04 DIAGNOSIS — R7881 Bacteremia: Secondary | ICD-10-CM

## 2020-02-04 DIAGNOSIS — A419 Sepsis, unspecified organism: Secondary | ICD-10-CM

## 2020-02-04 DIAGNOSIS — N179 Acute kidney failure, unspecified: Secondary | ICD-10-CM

## 2020-02-04 HISTORY — PX: TEE WITHOUT CARDIOVERSION: SHX5443

## 2020-02-04 LAB — BASIC METABOLIC PANEL
Anion gap: 10 (ref 5–15)
BUN: 67 mg/dL — ABNORMAL HIGH (ref 6–20)
CO2: 30 mmol/L (ref 22–32)
Calcium: 7.6 mg/dL — ABNORMAL LOW (ref 8.9–10.3)
Chloride: 102 mmol/L (ref 98–111)
Creatinine, Ser: 4.19 mg/dL — ABNORMAL HIGH (ref 0.61–1.24)
GFR calc Af Amer: 17 mL/min — ABNORMAL LOW (ref >60)
GFR calc non Af Amer: 15 mL/min — ABNORMAL LOW (ref >60)
Glucose, Bld: 204 mg/dL — ABNORMAL HIGH (ref 70–99)
Potassium: 4 mmol/L (ref 3.5–5.1)
Sodium: 142 mmol/L (ref 135–145)

## 2020-02-04 LAB — CBC
HCT: 23.5 % — ABNORMAL LOW (ref 39.0–52.0)
Hemoglobin: 7.7 g/dL — ABNORMAL LOW (ref 13.0–17.0)
MCH: 29.6 pg (ref 26.0–34.0)
MCHC: 32.8 g/dL (ref 30.0–36.0)
MCV: 90.4 fL (ref 80.0–100.0)
Platelets: 353 10*3/uL (ref 150–400)
RBC: 2.6 MIL/uL — ABNORMAL LOW (ref 4.22–5.81)
RDW: 12.6 % (ref 11.5–15.5)
WBC: 11.1 10*3/uL — ABNORMAL HIGH (ref 4.0–10.5)
nRBC: 0 % (ref 0.0–0.2)

## 2020-02-04 LAB — GLUCOSE, CAPILLARY
Glucose-Capillary: 184 mg/dL — ABNORMAL HIGH (ref 70–99)
Glucose-Capillary: 135 mg/dL — ABNORMAL HIGH (ref 70–99)
Glucose-Capillary: 136 mg/dL — ABNORMAL HIGH (ref 70–99)

## 2020-02-04 SURGERY — ECHOCARDIOGRAM, TRANSESOPHAGEAL
Anesthesia: Monitor Anesthesia Care

## 2020-02-04 MED ORDER — INSULIN GLARGINE 100 UNIT/ML ~~LOC~~ SOLN
10.0000 [IU] | Freq: Every day | SUBCUTANEOUS | Status: DC
  Administered 2020-02-04: 10 [IU] via SUBCUTANEOUS
  Filled 2020-02-04 (×2): qty 0.1

## 2020-02-04 MED ORDER — LIDOCAINE VISCOUS HCL 2 % MT SOLN
OROMUCOSAL | Status: AC
  Filled 2020-02-04: qty 15

## 2020-02-04 MED ORDER — PROPOFOL 500 MG/50ML IV EMUL
INTRAVENOUS | Status: DC | PRN
  Administered 2020-02-04: 75 ug/kg/min via INTRAVENOUS

## 2020-02-04 MED ORDER — DEXMEDETOMIDINE (PRECEDEX) IN NS 20 MCG/5ML (4 MCG/ML) IV SYRINGE
PREFILLED_SYRINGE | INTRAVENOUS | Status: DC | PRN
  Administered 2020-02-04: 20 ug via INTRAVENOUS

## 2020-02-04 MED ORDER — PROPOFOL 10 MG/ML IV BOLUS
INTRAVENOUS | Status: DC | PRN
  Administered 2020-02-04: 20 mg via INTRAVENOUS

## 2020-02-04 NOTE — Anesthesia Preprocedure Evaluation (Signed)
Anesthesia Evaluation  Patient identified by MRN, date of birth, ID band Patient awake    Reviewed: Allergy & Precautions, NPO status , Patient's Chart, lab work & pertinent test results  Airway Mallampati: III  TM Distance: >3 FB Neck ROM: Full    Dental no notable dental hx.    Pulmonary neg pulmonary ROS,     + decreased breath sounds      Cardiovascular Exercise Tolerance: Poor hypertension, Normal cardiovascular exam Rhythm:Regular Rate:Normal  NSR   Neuro/Psych  Neuromuscular disease (polyneuropathy) negative psych ROS   GI/Hepatic   Endo/Other  diabetes, Poorly Controlled, Type 2, Insulin Dependent  Renal/GU ARF and CRFRenal disease     Musculoskeletal   Abdominal Normal abdominal exam  (+)   Peds  Hematology  (+) anemia ,   Anesthesia Other Findings MRSA bacteremia  Reproductive/Obstetrics                             Anesthesia Physical Anesthesia Plan  ASA: IV  Anesthesia Plan: MAC   Post-op Pain Management:    Induction: Intravenous  PONV Risk Score and Plan: TIVA and Propofol infusion  Airway Management Planned: Natural Airway and Simple Face Mask  Additional Equipment:   Intra-op Plan:   Post-operative Plan:   Informed Consent: I have reviewed the patients History and Physical, chart, labs and discussed the procedure including the risks, benefits and alternatives for the proposed anesthesia with the patient or authorized representative who has indicated his/her understanding and acceptance.     Dental advisory given  Plan Discussed with:   Anesthesia Plan Comments:         Anesthesia Quick Evaluation

## 2020-02-04 NOTE — Interval H&P Note (Signed)
History and Physical Interval Note:  02/04/2020 7:21 AM  Peter [name]  has presented today for surgery, with the diagnosis of BATEREMIA.  The various methods of treatment have been discussed with the patient and family. After consideration of risks, benefits and other options for treatment, the patient has consented to  Procedure(s): TRANSESOPHAGEAL ECHOCARDIOGRAM (TEE) (N/A) as a surgical intervention.  The patient's history has been reviewed, patient examined, no change in status, stable for surgery.  I have reviewed the patient's chart and labs.  Questions were answered to the patient's satisfaction.     Shellia Hartl Harrell Gave
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
## 2020-02-04 NOTE — Transfer of Care (Signed)
Immediate Anesthesia Transfer of Care Note  Patient: Peter [name]  Procedure(s) Performed: TRANSESOPHAGEAL ECHOCARDIOGRAM (TEE) (N/A )  Patient Location: Endoscopy Unit  Anesthesia Type:MAC  Level of Consciousness: drowsy  Airway & Oxygen Therapy: Patient Spontanous Breathing and Patient connected to nasal cannula oxygen  Post-op Assessment: Report given to RN and Post -op Vital signs reviewed and stable  Post vital signs: Reviewed and stable  Last Vitals:  Vitals Value Taken Time  BP 122/58 02/04/20 0805  Temp 37.3 C 02/04/20 0805  Pulse 90 02/04/20 0807  Resp 16 02/04/20 0807  SpO2 98 % 02/04/20 0807  Vitals shown include unvalidated device data.  Last Pain:  Vitals:   02/04/20 0805  TempSrc: Temporal  PainSc:       Patients Stated Pain Goal: 0 (29/56/21 3086)  Complications: No complications documented.

## 2020-02-04 NOTE — Progress Notes (Addendum)
Physical Therapy Wound Treatment Patient Details  Name: Peter [name] MRN: [id number] Date of Birth: 1961-03-01  Today's Date: 02/04/2020 Time: 1125-1242 Time Calculation (min): 77 min  Subjective  Subjective: Pt lying on side in bed on entry, surgical PA had just previously undressed wounds Patient and Family Stated Goals: heal wounds Date of Onset: 02/03/20 Prior Treatments: topical treatment  Pain Score:  Pt given Percocet prior only occasional grimace when treating R central buttock wound   Wound Assessment  Wound / Incision (Open or Dehisced) 02/04/20 Incision - Open Buttocks Right Right superior buttock wound  (Active)  Wound Image   02/04/20 1300  Dressing Type Gauze (Comment);Barrier Film (skin prep);Normal saline moist dressing;Mesh briefs 02/04/20 1300  Dressing Changed Other (Comment) 02/04/20 1300  Dressing Change Frequency PRN 02/04/20 1300  Site / Wound Assessment Brown;Black;Red;Yellow 02/04/20 1300  % Wound base Red or Granulating 40% 02/04/20 1300  % Wound base Yellow/Fibrinous Exudate 50% 02/04/20 1300  % Wound base Black/Eschar 10% 02/04/20 1300  Peri-wound Assessment Induration;Erythema (non-blanchable);Denuded 02/04/20 1300  Wound Length (cm) 3.2 cm 02/04/20 1300  Wound Width (cm) 2.4 cm 02/04/20 1300  Wound Depth (cm) 1.9 cm 02/04/20 1300  Wound Volume (cm^3) 14.59 cm^3 02/04/20 1300  Wound Surface Area (cm^2) 7.68 cm^2 02/04/20 1300  Margins Unattached edges (unapproximated) 02/04/20 1300  Closure None 02/04/20 1300  Drainage Amount Other (Comment) 02/04/20 1300  Treatment Debridement (Selective);Hydrotherapy (Pulse lavage);Packing (Saline gauze) 02/04/20 1300     Wound / Incision (Open or Dehisced) 02/04/20 Incision - Open Buttocks Right R central buttock incision  (Active)  Wound Image   02/04/20 1300  Dressing Type Barrier Film (skin prep);Gauze (Comment);Normal saline moist dressing;Mesh briefs 02/04/20 1300  Dressing Changed Other (Comment)  02/04/20 1300  Dressing Status Clean;Dry;Intact 02/04/20 1300  Dressing Change Frequency PRN 02/04/20 1300  Site / Wound Assessment Yellow;Friable 02/04/20 1300  % Wound base Red or Granulating 0% 02/04/20 1300  % Wound base Yellow/Fibrinous Exudate 100% 02/04/20 1300  % Wound base Black/Eschar 0% 02/04/20 1300  % Wound base Other/Granulation Tissue (Comment) 0% 02/04/20 1300  Peri-wound Assessment Erythema (non-blanchable);Induration;Denuded 02/04/20 1300  Wound Length (cm) 3.3 cm 02/04/20 1300  Wound Width (cm) 0.7 cm 02/04/20 1300  Wound Depth (cm) 1.2 cm 02/04/20 1300  Wound Volume (cm^3) 2.77 cm^3 02/04/20 1300  Wound Surface Area (cm^2) 2.31 cm^2 02/04/20 1300  Margins Unattached edges (unapproximated) 02/04/20 1300  Drainage Amount Other (Comment) 02/04/20 1300  Drainage Description Other (Comment) 02/04/20 1300  Treatment Debridement (Selective);Hydrotherapy (Pulse lavage);Packing (Saline gauze) 02/04/20 1300     Wound / Incision (Open or Dehisced) 02/04/20 Incision - Open Buttocks Right R inferior buttock main  (Active)  Wound Image   02/04/20 1300  Dressing Type Barrier Film (skin prep);Gauze (Comment);Mesh briefs;Normal saline moist dressing 02/04/20 1300  Dressing Changed Other (Comment) 02/04/20 1300  Dressing Change Frequency PRN 02/04/20 1300  % Wound base Red or Granulating 0% 02/04/20 1300  % Wound base Yellow/Fibrinous Exudate 100% 02/04/20 1300  % Wound base Black/Eschar 0% 02/04/20 1300  % Wound base Other/Granulation Tissue (Comment) 0% 02/04/20 1300  Peri-wound Assessment Denuded;Erythema (non-blanchable);Induration 02/04/20 1300  Wound Length (cm) 5.2 cm 02/04/20 1300  Wound Width (cm) 3.3 cm 02/04/20 1300  Wound Depth (cm) 2.2 cm 02/04/20 1300  Wound Volume (cm^3) 37.75 cm^3 02/04/20 1300  Wound Surface Area (cm^2) 17.16 cm^2 02/04/20 1300  Margins Unattached edges (unapproximated) 02/04/20 1300  Closure None 02/04/20 1300  Drainage Amount Other (Comment)  02/04/20 1300  Drainage Description Other (Comment) 02/04/20 1300  Treatment Debridement (Selective);Hydrotherapy (Pulse lavage);Packing (Saline gauze) 02/04/20 1300     Wound / Incision (Open or Dehisced) 02/04/20 Incision - Open Buttocks Right R Inferior buttock satellite (will likely merge with R inferior buttock main) (Active)  Dressing Type Gauze (Comment);Mesh briefs 02/04/20 1300  Dressing Changed New 02/04/20 1300  Dressing Change Frequency PRN 02/04/20 1300  Site / Wound Assessment Yellow 02/04/20 1300  % Wound base Red or Granulating 0% 02/04/20 1300  % Wound base Yellow/Fibrinous Exudate 100% 02/04/20 1300  % Wound base Black/Eschar 0% 02/04/20 1300  % Wound base Other/Granulation Tissue (Comment) 0% 02/04/20 1300  Peri-wound Assessment Maceration;Pink;Erythema (non-blanchable) 02/04/20 1300  Wound Length (cm) 1.1 cm 02/04/20 1300  Wound Width (cm) 0.8 cm 02/04/20 1300  Wound Depth (cm) 0 cm 02/04/20 1300  Wound Volume (cm^3) 0 cm^3 02/04/20 1300  Wound Surface Area (cm^2) 0.88 cm^2 02/04/20 1300  Margins Unattached edges (unapproximated) 02/04/20 1300  Closure None 02/04/20 1300  Drainage Amount Other (Comment) 02/04/20 1300  Drainage Description Other (Comment) 02/04/20 1300  Treatment Debridement (Selective);Hydrotherapy (Pulse lavage);Packing (Saline gauze) 02/04/20 1300     Wound / Incision (Open or Dehisced) 02/04/20 Incision - Open Buttocks Left L central buttock  (Active)  Wound Image   02/04/20 1400  Dressing Type Gauze (Comment);Barrier Film (skin prep);Mesh briefs;Normal saline moist dressing 02/04/20 1400  Dressing Changed Other (Comment) 02/04/20 1400  Dressing Status Clean;Dry;Intact 02/04/20 1400  Dressing Change Frequency PRN 02/04/20 1400  Site / Wound Assessment Granulation tissue;Red;Yellow;Brown;Black 02/04/20 1400  % Wound base Red or Granulating 10% 02/04/20 1400  % Wound base Yellow/Fibrinous Exudate 60% 02/04/20 1400  % Wound base Black/Eschar 30%  02/04/20 1400  Peri-wound Assessment Erythema (non-blanchable);Induration 02/04/20 1400  Wound Length (cm) 4.8 cm 02/04/20 1400  Wound Width (cm) 1.3 cm 02/04/20 1400  Wound Depth (cm) 2.4 cm 02/04/20 1400  Wound Volume (cm^3) 14.98 cm^3 02/04/20 1400  Wound Surface Area (cm^2) 6.24 cm^2 02/04/20 1400  Margins Unattached edges (unapproximated) 02/04/20 1400  Closure None 02/04/20 1400  Drainage Amount Other (Comment) 02/04/20 1400  Drainage Description Other (Comment) 02/04/20 1400  Treatment Debridement (Selective);Hydrotherapy (Pulse lavage);Packing (Saline gauze) 02/04/20 1400     Wound / Incision (Open or Dehisced) 02/04/20 Incision - Open Buttocks Left L inferior buttock  (Active)  Wound Image   02/04/20 1300  Dressing Type Barrier Film (skin prep);Gauze (Comment);Normal saline moist dressing 02/04/20 1400  % Wound base Yellow/Fibrinous Exudate 50% 02/04/20 1400  % Wound base Black/Eschar 50% 02/04/20 1400  Wound Length (cm) 4.7 cm 02/04/20 1400  Wound Width (cm) 1.2 cm 02/04/20 1400  Wound Depth (cm) 1.8 cm 02/04/20 1400  Wound Volume (cm^3) 10.15 cm^3 02/04/20 1400  Wound Surface Area (cm^2) 5.64 cm^2 02/04/20 1400  Margins Unattached edges (unapproximated) 02/04/20 1400  Closure None 02/04/20 1400  Drainage Amount Other (Comment) 02/04/20 1400  Drainage Description Other (Comment) 02/04/20 1400  Treatment Debridement (Selective);Hydrotherapy (Pulse lavage);Packing (Saline gauze) 02/04/20 1400     Hydrotherapy Pulsed lavage therapy - wound location: all buttock wounds Pulsed Lavage with Suction (psi): 12 psi Pulsed Lavage with Suction - Normal Saline Used: 2000 mL Pulsed Lavage Tip: Tip with splash shield Selective Debridement Selective Debridement - Location: L and R buttock wounds  Selective Debridement - Tools Used: Forceps;Scalpel Selective Debridement - Tissue Removed: necrotic fibrin and eschar/cauterized tissue   Wound Assessment and Plan  Wound Therapy -  Assess/Plan/Recommendations Wound Therapy - Clinical Statement: Pt has just had  5 abcesses incised by surgery which are filled with yellow fibrinous tissue and/or necrotic cauterized tissue, most of the periwound area continues to be indurated and may ultimately increase wound size. Location of wounds makes it difficult to keep clean as pt does not inform RN staff of soiling, thinks it is just gas when it isn't. Attempted to dress wounds individually so that if one dressing is fouled the whole dressing does not need to be replaced. Pt will benefit from pulsed lavage and selective debridement to decrease bioburden and improved healing.  Wound Therapy - Functional Problem List: uncontrolled DM Factors Delaying/Impairing Wound Healing: Diabetes Mellitus;Altered sensation;Infection - systemic/local;Multiple medical problems Hydrotherapy Plan: Debridement;Dressing change;Pulsatile lavage with suction;Patient/family education Wound Therapy - Frequency: 6X / week Wound Therapy - Follow Up Recommendations: Spink Wound Plan: see above  Wound Therapy Goals- Improve the function of patient's integumentary system by progressing the wound(s) through the phases of wound healing (inflammation - proliferation - remodeling) by: Decrease Necrotic Tissue to: 0 Decrease Necrotic Tissue - Progress: Goal set today Increase Granulation Tissue to: 100 Increase Granulation Tissue - Progress: Goal set today Goals/treatment plan/discharge plan were made with and agreed upon by patient/family: Yes Time For Goal Achievement: 7 days Wound Therapy - Potential for Goals: Fair  Goals will be updated until maximal potential achieved or discharge criteria met.  Discharge criteria: when goals achieved, discharge from hospital, MD decision/surgical intervention, no progress towards goals, refusal/missing three consecutive treatments without notification or medical reason.  GP    Dani Gobble. Migdalia Dk PT, DPT Acute  Rehabilitation Services Pager [phone] Office [phone]  [place] 02/04/2020, 2:39 PM

## 2020-02-04 NOTE — Progress Notes (Signed)
Day of Surgery  Subjective: No new complaints. Had TEE this am with no findings of vegetation.   ROS: See above, otherwise other systems negative  Objective: Vital signs in last 24 hours: Temp:  [98 F (36.7 C)-99.8 F (37.7 C)] 99.1 F (37.3 C) (08/17 0805) Pulse Rate:  [70-88] 88 (08/17 0820) Resp:  [14-21] 17 (08/17 0820) BP: (122-176)/(58-92) 135/72 (08/17 0820) SpO2:  [96 %-100 %] 98 % (08/17 0820) Weight:  [72.3 kg] 72.3 kg (08/17 0704) Last BM Date: 01/31/20  Intake/Output from previous day: 08/16 0701 - 08/17 0700 In: 1665.4 [I.V.:1665.4] Out: 1050 [Urine:1050] Intake/Output this shift: No intake/output data recorded.  PE: Skin: buttock wounds are undressed.  The two near the rectum have some granulation tissue at the base, but the edges are 100% necrotic.  There is an area of skin necrosis and breakdown starting to occur on the left gluteus just lateral to the left inferior wound.  Suspect these will connect after hydrotherapy.  The mid left wound has not been packed and is mostly fibrinous tissue.  The superior wound is the cleanest of all the wounds.  Lab Results:  Recent Labs    02/02/20 0225 02/04/20 0319  WBC 21.4* 11.1*  HGB 8.4* 7.7*  HCT 25.8* 23.5*  PLT 340 353   BMET Recent Labs    02/02/20 0225 02/03/20 0402  NA 136 139  K 4.5 4.0  CL 106 106  CO2 17* 21*  GLUCOSE 156* 180*  BUN 96* 80*  CREATININE 6.28* 5.24*  CALCIUM 7.9* 7.7*   PT/INR No results for input(s): LABPROT, INR in the last 72 hours. CMP     Component Value Date/Time   NA 139 02/03/2020 0402   K 4.0 02/03/2020 0402   CL 106 02/03/2020 0402   CO2 21 (L) 02/03/2020 0402   GLUCOSE 180 (H) 02/03/2020 0402   BUN 80 (H) 02/03/2020 0402   CREATININE 5.24 (H) 02/03/2020 0402   CALCIUM 7.7 (L) 02/03/2020 0402   PROT 5.5 (L) 02/02/2020 0225   ALBUMIN 2.0 (L) 02/03/2020 0402   AST 26 02/02/2020 0225   ALT 29 02/02/2020 0225   ALKPHOS 84 02/02/2020 0225   BILITOT 0.4  02/02/2020 0225   GFRNONAA 11 (L) 02/03/2020 0402   GFRAA 13 (L) 02/03/2020 0402   Lipase  No results found for: LIPASE     Studies/Results: US RENAL  Result Date: 02/03/2020 CLINICAL DATA:  Acute kidney injury. EXAM: RENAL / URINARY TRACT ULTRASOUND COMPLETE COMPARISON:  CT 02/01/2020, renal ultrasound 01/03/2020 FINDINGS: Right Kidney: Renal measurements: 11.7 x 5.6 x 5.3 cm = volume: 180 mL. Mild increased renal parenchymal echogenicity. No mass or hydronephrosis visualized. Left Kidney: Renal measurements: 10.1 x 5.3 x 5.2 cm = volume: 146 mL. Mild hydronephrosis. Renal pelvis measures 12 mm in dimension. Mild increased renal echogenicity. No evidence of focal lesion. Bladder: Appears normal for degree of bladder distention. Other: Incidental gallstones. IMPRESSION: 1. Mild left hydronephrosis. This is unchanged from prior CT as well as renal ultrasound last month. Cause for obstruction of stab list. 2. Mild increased renal echogenicity consistent with chronic medical renal disease. Electronically Signed   By: Keith Rake M.D.   On: 02/03/2020 23:07   ECHOCARDIOGRAM COMPLETE  Result Date: 02/02/2020    ECHOCARDIOGRAM REPORT   Patient Name:   LAIN TETTERTON Date of Exam: 02/02/2020 Medical Rec #:  [id number]        Height:       70.0 in  Accession #:    [id number]       Weight:       159.4 lb Date of Birth:  1961/05/12       BSA:          1.895 m Patient Age:    59 years         BP:           142/84 mmHg Patient Gender: M                HR:           84 bpm. Exam Location:  Inpatient Procedure: 2D Echo Indications:    bacteremia  History:        Patient has no prior history of Echocardiogram examinations.                 Risk Factors:Diabetes and Hypertension.  Sonographer:    Jannett Celestine RDCS (AE) Referring Phys: [place]  1. There is a linear independently mobile echodensity in the LVOT (see image 30). This cannot be clearly traced back to a structure, but aortic  valve endocarditis not excluded. Left ventricular ejection fraction, by estimation, is 65 to 70%. The left ventricle has normal function. The left ventricle has no regional wall motion abnormalities. Left ventricular diastolic parameters were normal.  2. Right ventricular systolic function is normal. The right ventricular size is normal. Tricuspid regurgitation signal is inadequate for assessing PA pressure.  3. The mitral valve is normal in structure. Trivial mitral valve regurgitation. No evidence of mitral stenosis.  4. Bicuspid aortic valve, with fusion of the RCC and LCC (image 20) with focal calcification. Mild AI. Marland Kitchen The aortic valve is bicuspid. Aortic valve regurgitation is mild. Mild aortic valve sclerosis is present, with no evidence of aortic valve stenosis.  5. The inferior vena cava is normal in size with greater than 50% respiratory variability, suggesting right atrial pressure of 3 mmHg. Comparison(s): No prior Echocardiogram. Conclusion(s)/Recommendation(s): Bicuspid aortic valve. There is an independently mobile linear echodensity seen in the LVOT. Given bacteremia, would recommend TEE to further evaluate for endocarditis. FINDINGS  Left Ventricle: There is a linear independently mobile echodensity in the LVOT (see image 30). This cannot be clearly traced back to a structure, but aortic valve endocarditis not excluded. Left ventricular ejection fraction, by estimation, is 65 to 70%. The left ventricle has normal function. The left ventricle has no regional wall motion abnormalities. The left ventricular internal cavity size was normal in size. There is borderline left ventricular hypertrophy. Left ventricular diastolic parameters were normal. Right Ventricle: The right ventricular size is normal. No increase in right ventricular wall thickness. Right ventricular systolic function is normal. Tricuspid regurgitation signal is inadequate for assessing PA pressure. Left Atrium: Left atrial size was  normal in size. Right Atrium: Right atrial size was normal in size. Pericardium: There is no evidence of pericardial effusion. Mitral Valve: The mitral valve is normal in structure. Trivial mitral valve regurgitation. No evidence of mitral valve stenosis. Tricuspid Valve: The tricuspid valve is normal in structure. Tricuspid valve regurgitation is trivial. No evidence of tricuspid stenosis. Aortic Valve: Bicuspid aortic valve, with fusion of the RCC and LCC (image 20) with focal calcification. Mild AI. The aortic valve is bicuspid. . There is mild thickening and mild calcification of the aortic valve. Aortic valve regurgitation is mild. Mild aortic valve sclerosis is present, with no evidence of aortic valve stenosis. There is mild thickening of the  aortic valve. There is mild calcification of the aortic valve. Pulmonic Valve: The pulmonic valve was grossly normal. Pulmonic valve regurgitation is trivial. Aorta: The aortic root was not well visualized, the ascending aorta was not well visualized and the aortic arch was not well visualized. Venous: The inferior vena cava is normal in size with greater than 50% respiratory variability, suggesting right atrial pressure of 3 mmHg. IAS/Shunts: The atrial septum is grossly normal.  LEFT VENTRICLE PLAX 2D LVIDd:         4.70 cm  Diastology LVIDs:         2.80 cm  LV e' lateral:   7.62 cm/s LV PW:         1.10 cm  LV E/e' lateral: 9.7 LV IVS:        1.20 cm  LV e' medial:    5.98 cm/s LVOT diam:     2.20 cm  LV E/e' medial:  12.4 LV SV:         87 LV SV Index:   46 LVOT Area:     3.80 cm  RIGHT VENTRICLE RV S prime:     9.46 cm/s TAPSE (M-mode): 1.7 cm LEFT ATRIUM           Index       RIGHT ATRIUM          Index LA diam:      3.00 cm 1.58 cm/m  RA Area:     8.89 cm LA Vol (A2C): 20.6 ml 10.87 ml/m RA Volume:   15.00 ml 7.91 ml/m LA Vol (A4C): 21.9 ml 11.55 ml/m  AORTIC VALVE LVOT Vmax:   95.30 cm/s LVOT Vmean:  71.000 cm/s LVOT VTI:    0.229 m  AORTA Ao Root diam:  3.60 cm MITRAL VALVE MV Area (PHT): 3.27 cm     SHUNTS MV Decel Time: 232 msec     Systemic VTI:  0.23 m MV E velocity: 74.10 cm/s   Systemic Diam: 2.20 cm MV A velocity: 102.00 cm/s MV E/A ratio:  0.73 Buford Dresser MD Electronically signed by Buford Dresser MD Signature Date/Time: 02/02/2020/4:39:02 PM    Final     Anti-infectives: Anti-infectives (From admission, onward)   Start     Dose/Rate Route Frequency Ordered Stop   02/04/20 2000  DAPTOmycin (CUBICIN) 600 mg in sodium chloride 0.9 % IVPB     Discontinue     600 mg 224 mL/hr over 30 Minutes Intravenous Every 48 hours 02/03/20 1426     02/02/20 1430  DAPTOmycin (CUBICIN) 500 mg in sodium chloride 0.9 % IVPB  Status:  Discontinued        500 mg 220 mL/hr over 30 Minutes Intravenous Every 48 hours 02/02/20 1332 02/03/20 1426   02/01/20 0800  meropenem (MERREM) 500 mg in sodium chloride 0.9 % 100 mL IVPB  Status:  Discontinued        500 mg 200 mL/hr over 30 Minutes Intravenous Every 12 hours 02/01/20 0656 02/02/20 1304   02/01/20 0655  vancomycin variable dose per unstable renal function (pharmacist dosing)  Status:  Discontinued         Does not apply See admin instructions 02/01/20 0656 02/02/20 1305   02/01/20 0300  ceFEPIme (MAXIPIME) 2 g in sodium chloride 0.9 % 100 mL IVPB        2 g 200 mL/hr over 30 Minutes Intravenous  Once 02/01/20 0252 02/01/20 0446   02/01/20 0300  vancomycin (VANCOREADY) IVPB 1500 mg/300 mL  1,500 mg 150 mL/hr over 120 Minutes Intravenous  Once 02/01/20 0252 02/01/20 0624       Assessment/Plan DM CKD/AKI- Creatinine - 5 Anemia - Hgb - 8.4 - 02/02/2020 HTN MRSA bacteremia  POD 3, s/p IRRIGATION AND DEBRIDEMENT ABSCESSof multiple buttocks abscesses - 8/14 - [place] -cxs reveal MRSA.  abx per ID -WBC down to 11K - PT hydrotherapy to start today with cleaning up these wounds.  The patient doesn't do a good job of letting staff know when he has had a BM which is going to cause  some issues with further skin breakdown if he continues to lay in his stool for a while. -otherwise increase to TID dressing changes -cont to follow, not ready for DC yet  FEN - carb mod VTE - heparin ID - cubicin   LOS: 3 days    Henreitta Cea , [facility] 02/04/2020, 11:24 AM Please see Amion for pager number during day hours 7:00am-4:30pm or 7:00am -11:30am on weekends

## 2020-02-04 NOTE — Plan of Care (Signed)
?  Problem: Clinical Measurements: ?Goal: Ability to maintain clinical measurements within normal limits will improve ?Outcome: Progressing ?Goal: Will remain free from infection ?Outcome: Progressing ?Goal: Diagnostic test results will improve ?Outcome: Progressing ?  ?

## 2020-02-04 NOTE — CV Procedure (Signed)
    TRANSESOPHAGEAL ECHOCARDIOGRAM   NAME:  Peter [name]   MRN: [id number] DOB:  1960/10/20   ADMIT DATE: 01/31/2020  INDICATIONS: Bacteremia, evaluate for endocarditis  PROCEDURE:   Informed consent was obtained prior to the procedure. The risks, benefits and alternatives for the procedure were discussed and the patient comprehended these risks.  Risks include, but are not limited to, cough, sore throat, vomiting, nausea, somnolence, esophageal and stomach trauma or perforation, bleeding, low blood pressure, aspiration, pneumonia, infection, trauma to the teeth and death.    Procedural time out performed. The oropharynx was anesthetized with topical 1% lidocaine.   Patient received monitored anesthesia care under the supervision of Dr Elgie Congo. He received 20 mcg precedex and 140 mg propofol.  The transesophageal probe was inserted in the esophagus and stomach without difficulty and multiple views were obtained.    COMPLICATIONS:    There were no immediate complications.  FINDINGS:  LEFT VENTRICLE: EF = 65-70%. LVH. No regional wall motion abnormalities.  RIGHT VENTRICLE: Normal size and function.   LEFT ATRIUM: No thrombus/mass.  LEFT ATRIAL APPENDAGE: No thrombus/mass.   RIGHT ATRIUM: No thrombus/mass.  AORTIC VALVE:  Bicuspid aortic valve. Tivial regurgitation. No vegetation.  MITRAL VALVE:    Normal structure. Trivial regurgitation. No vegetation. There is a structure near the mitral-aortic annular juncture that appears to be a chordal structure. This corresponds to the echodensity seen on the chest wall.  TRICUSPID VALVE: Normal structure. Trivial regurgitation. No vegetation.  PULMONIC VALVE: Grossly normal structure. Trivial regurgitation. No apparent vegetation.  INTERATRIAL SEPTUM: No PFO or ASD seen by color Doppler.  PERICARDIUM: No effusion noted.  DESCENDING AORTA: Mild diffuse plaque seen.  CONCLUSION: no evidence of endocarditis. Bicuspid aortic valve.  Structure seen on TTE is associated with the mitral valve, not the aortic, near the mitral-aortic annular junction. This appears to be a chordae, not consistent with vegetation.   Buford Dresser, MD, PhD [facility]  [address], [place], [place] [address] [phone]   7:56 AM

## 2020-02-04 NOTE — Anesthesia Procedure Notes (Signed)
Procedure Name: MAC Performed by: Blakeleigh Domek B, CRNA Pre-anesthesia Checklist: Patient identified, Emergency Drugs available, Suction available, Patient being monitored and Timeout performed Patient Re-evaluated:Patient Re-evaluated prior to induction Oxygen Delivery Method: Nasal cannula Preoxygenation: Pre-oxygenation with 100% oxygen Induction Type: IV induction Airway Equipment and Method: Bite block Placement Confirmation: positive ETCO2 Dental Injury: Teeth and Oropharynx as per pre-operative assessment        

## 2020-02-04 NOTE — Anesthesia Postprocedure Evaluation (Signed)
Anesthesia Post Note  Patient: Peter [name]  Procedure(s) Performed: TRANSESOPHAGEAL ECHOCARDIOGRAM (TEE) (N/A )     Patient location during evaluation: PACU Anesthesia Type: MAC Level of consciousness: awake and alert Pain management: pain level controlled Vital Signs Assessment: post-procedure vital signs reviewed and stable Respiratory status: spontaneous breathing, nonlabored ventilation, respiratory function stable and patient connected to nasal cannula oxygen Cardiovascular status: stable and blood pressure returned to baseline Postop Assessment: no apparent nausea or vomiting Anesthetic complications: no   No complications documented.  Last Vitals:  Vitals:   02/04/20 0815 02/04/20 0820  BP: (!) 138/59 135/72  Pulse: 87 88  Resp: (!) 21 17  Temp:    SpO2: 96% 98%    Last Pain:  Vitals:   02/04/20 0858  TempSrc:   PainSc: 2                  Merlinda Frederick

## 2020-02-05 ENCOUNTER — Inpatient Hospital Stay (HOSPITAL_COMMUNITY): Payer: BC Managed Care – PPO

## 2020-02-05 DIAGNOSIS — B9562 Methicillin resistant Staphylococcus aureus infection as the cause of diseases classified elsewhere: Secondary | ICD-10-CM

## 2020-02-05 DIAGNOSIS — R7881 Bacteremia: Secondary | ICD-10-CM

## 2020-02-05 HISTORY — PX: IR US GUIDE VASC ACCESS RIGHT: IMG2390

## 2020-02-05 HISTORY — PX: IR FLUORO GUIDE CV LINE RIGHT: IMG2283

## 2020-02-05 LAB — BASIC METABOLIC PANEL
Anion gap: 8 (ref 5–15)
BUN: 57 mg/dL — ABNORMAL HIGH (ref 6–20)
CO2: 32 mmol/L (ref 22–32)
Calcium: 7.8 mg/dL — ABNORMAL LOW (ref 8.9–10.3)
Chloride: 100 mmol/L (ref 98–111)
Creatinine, Ser: 3.82 mg/dL — ABNORMAL HIGH (ref 0.61–1.24)
GFR calc Af Amer: 19 mL/min — ABNORMAL LOW (ref >60)
GFR calc non Af Amer: 16 mL/min — ABNORMAL LOW (ref >60)
Glucose, Bld: 162 mg/dL — ABNORMAL HIGH (ref 70–99)
Potassium: 4.3 mmol/L (ref 3.5–5.1)
Sodium: 140 mmol/L (ref 135–145)

## 2020-02-05 LAB — GLUCOSE, CAPILLARY
Glucose-Capillary: 80 mg/dL (ref 70–99)
Glucose-Capillary: 151 mg/dL — ABNORMAL HIGH (ref 70–99)
Glucose-Capillary: 115 mg/dL — ABNORMAL HIGH (ref 70–99)
Glucose-Capillary: 149 mg/dL — ABNORMAL HIGH (ref 70–99)

## 2020-02-05 LAB — CBC WITH DIFFERENTIAL/PLATELET
Abs Immature Granulocytes: 0.21 10*3/uL — ABNORMAL HIGH (ref 0.00–0.07)
Basophils Absolute: 0.1 10*3/uL (ref 0.0–0.1)
Basophils Relative: 1 %
Eosinophils Absolute: 0.2 10*3/uL (ref 0.0–0.5)
Eosinophils Relative: 2 %
HCT: 24.1 % — ABNORMAL LOW (ref 39.0–52.0)
Hemoglobin: 7.6 g/dL — ABNORMAL LOW (ref 13.0–17.0)
Immature Granulocytes: 2 %
Lymphocytes Relative: 17 %
Lymphs Abs: 1.8 10*3/uL (ref 0.7–4.0)
MCH: 29.7 pg (ref 26.0–34.0)
MCHC: 31.5 g/dL (ref 30.0–36.0)
MCV: 94.1 fL (ref 80.0–100.0)
Monocytes Absolute: 0.8 10*3/uL (ref 0.1–1.0)
Monocytes Relative: 8 %
Neutro Abs: 7.7 10*3/uL (ref 1.7–7.7)
Neutrophils Relative %: 70 %
Platelets: 348 10*3/uL (ref 150–400)
RBC: 2.56 MIL/uL — ABNORMAL LOW (ref 4.22–5.81)
RDW: 12.4 % (ref 11.5–15.5)
WBC: 10.8 10*3/uL — ABNORMAL HIGH (ref 4.0–10.5)
nRBC: 0 % (ref 0.0–0.2)

## 2020-02-05 MED ORDER — COLLAGENASE 250 UNIT/GM EX OINT
TOPICAL_OINTMENT | Freq: Three times a day (TID) | CUTANEOUS | Status: DC
  Administered 2020-02-05 – 2020-02-06 (×2): 1 via TOPICAL
  Filled 2020-02-05 (×3): qty 30

## 2020-02-05 MED ORDER — LIDOCAINE HCL 1 % IJ SOLN
INTRAMUSCULAR | Status: AC
  Filled 2020-02-05: qty 20

## 2020-02-05 MED ORDER — HEPARIN SODIUM (PORCINE) 5000 UNIT/ML IJ SOLN
5000.0000 [IU] | Freq: Three times a day (TID) | INTRAMUSCULAR | Status: DC
  Administered 2020-02-06 – 2020-02-14 (×25): 5000 [IU] via SUBCUTANEOUS
  Filled 2020-02-05 (×26): qty 1

## 2020-02-05 MED ORDER — LIDOCAINE HCL (PF) 1 % IJ SOLN
INTRAMUSCULAR | Status: AC | PRN
  Administered 2020-02-05: 10 mL

## 2020-02-05 MED ORDER — LABETALOL HCL 5 MG/ML IV SOLN
10.0000 mg | Freq: Once | INTRAVENOUS | Status: AC
  Administered 2020-02-06: 10 mg via INTRAVENOUS
  Filled 2020-02-05: qty 4

## 2020-02-05 MED ORDER — SILVER NITRATE-POT NITRATE 75-25 % EX MISC
1.0000 | CUTANEOUS | Status: DC | PRN
  Filled 2020-02-05: qty 1

## 2020-02-05 NOTE — Progress Notes (Signed)
[facility] for Infectious Disease    Date of Admission:  01/31/2020   Total days of antibiotics 4          ID: Peter [name] is a 59 y.o. male with MRSA bacteremia with deep tissue abscess Principal Problem:   Cellulitis and abscess of buttock Active Problems:   Uncontrolled type 2 diabetes mellitus with diabetic nephropathy, without long-term current use of insulin (HCC)   Essential hypertension   Mixed diabetic hyperlipidemia associated with type 2 diabetes mellitus (HCC)   Acute renal failure superimposed on stage 4 chronic kidney disease (HCC)   Metabolic acidosis   Sepsis with acute renal failure without septic shock (HCC)    Subjective: Afebrile, underwent TEE which is negative for vegetation  Medications:  . heparin injection (subcutaneous)  5,000 Units Subcutaneous Q8H  . insulin aspart  0-15 Units Subcutaneous TID AC & HS  . insulin glargine  10 Units Subcutaneous Daily  . lidocaine  20 mL Intradermal Once  . metoprolol tartrate  50 mg Oral BID    Objective: Vital signs in last 24 hours: Temp:  [97.8 F (36.6 C)-98.6 F (37 C)] 98 F (36.7 C) (08/18 0428) Pulse Rate:  [52-88] 65 (08/18 0428) Resp:  [16-21] 18 (08/18 0428) BP: (135-193)/(59-86) 159/80 (08/18 0428) SpO2:  [96 %-100 %] 97 % (08/18 0428) Physical Exam  Constitutional: He is oriented to person, place, and time. He appears well-developed and well-nourished. No distress.  HENT:  Mouth/Throat: Oropharynx is clear and moist. No oropharyngeal exudate.  Cardiovascular: Normal rate, regular rhythm and normal heart sounds. Exam reveals no gallop and no friction rub.  No murmur heard.  Pulmonary/Chest: Effort normal and breath sounds normal. No respiratory distress. He has no wheezes.  [place]= deep ischial wounds with necrotic tissue still in wound base Neurological: He is alert and oriented to person, place, and time.  Skin: Skin is warm and dry. No rash noted. No erythema.  Psychiatric: He  has a normal mood and affect. His behavior is normal.     Lab Results Recent Labs    02/03/20 0402 02/04/20 0319 02/04/20 1240  WBC  --  11.1*  --   HGB  --  7.7*  --   HCT  --  23.5*  --   NA 139  --  142  K 4.0  --  4.0  CL 106  --  102  CO2 21*  --  30  BUN 80*  --  67*  CREATININE 5.24*  --  4.19*   Liver Panel Recent Labs    02/03/20 0402  ALBUMIN 2.0*    Microbiology: 8/16 blood cx ngtd Studies/Results: US RENAL  Result Date: 02/03/2020 CLINICAL DATA:  Acute kidney injury. EXAM: RENAL / URINARY TRACT ULTRASOUND COMPLETE COMPARISON:  CT 02/01/2020, renal ultrasound 01/03/2020 FINDINGS: Right Kidney: Renal measurements: 11.7 x 5.6 x 5.3 cm = volume: 180 mL. Mild increased renal parenchymal echogenicity. No mass or hydronephrosis visualized. Left Kidney: Renal measurements: 10.1 x 5.3 x 5.2 cm = volume: 146 mL. Mild hydronephrosis. Renal pelvis measures 12 mm in dimension. Mild increased renal echogenicity. No evidence of focal lesion. Bladder: Appears normal for degree of bladder distention. Other: Incidental gallstones. IMPRESSION: 1. Mild left hydronephrosis. This is unchanged from prior CT as well as renal ultrasound last month. Cause for obstruction of stab list. 2. Mild increased renal echogenicity consistent with chronic medical renal disease. Electronically Signed   By: Keith Rake M.D.   On:  02/03/2020 23:07   ECHO TEE  Result Date: 02/04/2020    TRANSESOPHOGEAL ECHO REPORT   Patient Name:   Peter [name] Date of Exam: 02/04/2020 Medical Rec #:  [id number]        Height:       70.0 in Accession #:    [id number]       Weight:       159.4 lb Date of Birth:  [birth year]       BSA:          1.895 m Patient Age:    43 years         BP:           156/81 mmHg Patient Gender: M                HR:           64 bpm. Exam Location:  Inpatient Procedure: Transesophageal Echo and Color Doppler Indications:     Bacteremia  History:         Patient has prior history of  Echocardiogram examinations, most                  recent 02/02/2020. Risk Factors:Diabetes, Hypertension and                  Dyslipidemia. CKD.  Sonographer:     Clayton Lefort RDCS (AE) Referring Phys:  [id number] [place] Diagnosing Phys: Buford Dresser MD PROCEDURE: After discussion of the risks and benefits of a TEE, an informed consent was obtained from the patient. The transesophogeal probe was passed without difficulty through the esophogus of the patient. Local oropharyngeal anesthetic was provided with viscous lidocaine. Sedation performed by different physician. The patient was monitored while under deep sedation. Anesthestetic sedation was provided intravenously by Anesthesiology: 139.3mg  of Propofol. Image quality was good. The patient's vital signs; including heart rate, blood pressure, and oxygen saturation; remained stable throughout the procedure. The patient developed no complications during the procedure. IMPRESSIONS  1. Left ventricular ejection fraction, by estimation, is 65 to 70%. The left ventricle has normal function. The left ventricle has no regional wall motion abnormalities.  2. Right ventricular systolic function is normal. The right ventricular size is normal.  3. No left atrial/left atrial appendage thrombus was detected.  4. The mitral valve is normal in structure. Trivial mitral valve regurgitation. No evidence of mitral stenosis.  5. The aortic valve is bicuspid. Aortic valve regurgitation is trivial. No aortic stenosis is present.  6. There is mild (Grade II) plaque involving the descending aorta. Conclusion(s)/Recommendation(s): No evidence of endocarditis. Bicuspid aortic valve. Structure seen on TTE is associated with the mitral valve, not the aortic, near the mitral-aortic annular junction. This appears to be a chordae, not consistent with vegetation. FINDINGS  Left Ventricle: Left ventricular ejection fraction, by estimation, is 65 to 70%. The left ventricle has normal  function. The left ventricle has no regional wall motion abnormalities. The left ventricular internal cavity size was normal in size. There is  borderline left ventricular hypertrophy. Right Ventricle: The right ventricular size is normal. No increase in right ventricular wall thickness. Right ventricular systolic function is normal. Left Atrium: Left atrial size was not assessed. No left atrial/left atrial appendage thrombus was detected. Right Atrium: Right atrial size was not assessed. Pericardium: There is no evidence of pericardial effusion. Mitral Valve: The mitral valve is normal in structure. Trivial mitral valve regurgitation. No evidence of mitral valve stenosis. Tricuspid Valve:  The tricuspid valve is normal in structure. Tricuspid valve regurgitation is trivial. No evidence of tricuspid stenosis. Aortic Valve: The aortic valve is bicuspid. Aortic valve regurgitation is trivial. No aortic stenosis is present. Pulmonic Valve: The pulmonic valve was grossly normal. Pulmonic valve regurgitation is trivial. Aorta: The aortic root is normal in size and structure. There is mild (Grade II) plaque involving the descending aorta. IAS/Shunts: No atrial level shunt detected by color flow Doppler. Buford Dresser MD Electronically signed by Buford Dresser MD Signature Date/Time: 02/04/2020/5:29:42 PM    Final      Assessment/Plan: MRSA bacteremia with deep tissue infection = recommend to treat for 4 weeks of dalbavancin using 8/16 as day 1. If blood cx continue to be cleared on 8/18, can have picc line placed  Deep wound of buttock = continue with hydrotherapy. May still need surgical debridement.  [facility] for Infectious Diseases Cell: [phone] Pager: [phone]  02/05/2020, 8:13 AM

## 2020-02-05 NOTE — Consult Note (Signed)
Patient Status: [facility] - In-pt  Assessment and Plan: Patient in need of venous access for IV antibiotics anticipated at discharge.  Patient admitted with MRSA bacteremia s/p I&D of multiple buttock abscesses.   Current on daptomycin and in need of tunneled CVC due to chronic kidney disease.  Patient assessed at bedside.  He is agreeable to line placement.  Risks and benefits discussed with the patient including, but not limited to bleeding, infection, vascular injury, pneumothorax which may require chest tube placement, air embolism or even death  All of the patient's questions were answered, patient is agreeable to proceed. Consent signed and in chart.  ______________________________________________________________________   History of Present Illness: Peter [name] is a 59 y.o. male with history of MRSA bacteremia from multiple buttock abscesses. Patient planning to discharge home on IV antibiotics.  IR consulted for placement of tunneled CVC for anticipated discharge soon.   Allergies and medications reviewed.   Review of Systems: A 12 point ROS discussed and pertinent positives are indicated in the HPI above.  All other systems are negative.  Review of Systems  Constitutional: Negative for fatigue and fever.  Respiratory: Negative for cough and shortness of breath.   Cardiovascular: Negative for chest pain.  Gastrointestinal: Negative for abdominal pain, nausea and vomiting.  Musculoskeletal: Negative for back pain.  Neurological: Negative for headaches.  Psychiatric/Behavioral: Negative for behavioral problems and confusion.    Vital Signs: BP (!) 159/80 (BP Location: Left Arm)    Pulse 65    Temp 98 F (36.7 C) (Oral)    Resp 18    Ht 5\' 10"  (1.778 m)    Wt 159 lb 6.3 oz (72.3 kg)    SpO2 97%    BMI 22.87 kg/m   Physical Exam Vitals and nursing note reviewed.  Constitutional:      General: He is not in acute distress.    Appearance: Normal appearance.  HENT:      Mouth/Throat:     Mouth: Mucous membranes are moist.     Pharynx: Oropharynx is clear.  Cardiovascular:     Rate and Rhythm: Normal rate and regular rhythm.  Pulmonary:     Effort: Pulmonary effort is normal. No respiratory distress.  Abdominal:     General: Abdomen is flat.     Palpations: Abdomen is soft.  Skin:    General: Skin is warm and dry.  Neurological:     General: No focal deficit present.     Mental Status: He is alert and oriented to person, place, and time. Mental status is at baseline.  Psychiatric:        Mood and Affect: Mood normal.        Behavior: Behavior normal.        Thought Content: Thought content normal.        Judgment: Judgment normal.      Imaging reviewed.   Labs:  COAGS: Recent Labs    02/01/20 0304  INR 1.3*  APTT 27    BMP: Recent Labs    02/02/20 0225 02/03/20 0402 02/04/20 1240 02/05/20 0959  NA 136 139 142 140  K 4.5 4.0 4.0 4.3  CL 106 106 102 100  CO2 17* 21* 30 32  GLUCOSE 156* 180* 204* 162*  BUN 96* 80* 67* 57*  CALCIUM 7.9* 7.7* 7.6* 7.8*  CREATININE 6.28* 5.24* 4.19* 3.82*  GFRNONAA 9* 11* 15* 16*  GFRAA 10* 13* 17* 19*       Electronically Signed: Sherlie Ban  Nichola Sizer, PA 02/05/2020, 12:07 PM   I spent a total of 15 minutes in face to face in clinical consultation, greater than 50% of which was counseling/coordinating care for venous access.

## 2020-02-05 NOTE — Progress Notes (Signed)
[facility]  PROGRESS NOTE  Peter [name] SHF:[id number] DOB: 1961/05/26 DOA: 01/31/2020 PCP: Raelene Bott, MD Admit date - 01/31/2020   Admitting Physician Vernelle Emerald, MD  Outpatient Primary MD for the patient is Raelene Bott, MD  LOS - 4 Brief Narrative  Peter [name] is a 59 y.o. year old male with medical history significant for uncontrolled type 2 diabetes, diabetic nephropathy, HTN, HLD, CKD stage IV who presented on 8/13/21subjective chills, generalized weakness, nausea and vomiting in multiple draining ulcers of the buttocks for the past week and was found to have leukocytosis, AKI with creatinine of 6, and CT imaging consistent with gluteal fluid collections concerning for abscesses that required debridement by Dr. Lucia Gaskins with surgery on 8/14.  Patient was found to have MRSA bacteremia as well as staph aureus growing from cultures from the abscess.  Empiric vancomycin and meropenem were changed to daptomycin due to acute on chronic kidney disease and culture data with ID consultants assistance.  Patient underwent TTE and TEE which were negative for any signs of endocarditis.  Subjective  Today has no acute complaints.  Tolerating hydrotherapy with PT A & P   Deep tissue infection of buttocks with multiple abscesses complicated by MRSA bacteremia.  Status post I&D on 8/14 by Dr. Lucia Gaskins.  Remains afebrile, white count continues to downtrend -Continue daptomycin x4 weeks per ID, outpatient follow-up arranged -PT hydrotherapy for necrotic tissue management, 3 times daily dressing changes, not ready for discharge yet per surgery -As needed pain control with oxycodone  AKI on CKD stage IV, resolved.  Peak creatinine of 6 on admission likely related to infectious presentation.  Creatinine currently 3.8, previous baseline of 4.4 (per care everywhere).  Still maintaining adequate output, no acute indication for dialysis.  Renal ultrasound consistent with chronic medical  renal disease, also mild left hydronephrosis unchanged from prior CT last month -Outpatient nephrology referral placed -Avoid nephrotoxins, monitor BMP and output   Normocytic anemia, slightly worsening on admission hemoglobin 8.8-9.2.  Currently 7.6 with no signs or symptoms of bleeding -Monitor CBC  Type 2 diabetes, A1c 7.4 -Holding home loperamide -Discontinue Lantus given fasting blood sugar of 80 this a.m. -Continue sliding scale as needed, monitor CBGs  HTN, stable -Continue Lopressor 50 mg twice daily  Family Communication  : None  Code Status : Full  Disposition Plan  :  Patient is from home. Anticipated d/c date: 2 to 3 days. Barriers to d/c or necessity for inpatient status: No longer requiring intensive wound care with PT hydrotherapy, need to make sure no recurrent need for surgical intervention  Consults  : Surgery, ID  Procedures  : I&D on 8/14 by Dr. Lucia Gaskins  DVT Prophylaxis  : Heparin Lab Results  Component Value Date   PLT 353 02/04/2020    Diet :  Diet Order            Diet heart healthy/carb modified Room service appropriate? Yes; Fluid consistency: Thin  Diet effective now                  Inpatient Medications Scheduled Meds: . heparin injection (subcutaneous)  5,000 Units Subcutaneous Q8H  . insulin aspart  0-15 Units Subcutaneous TID AC & HS  . insulin glargine  10 Units Subcutaneous Daily  . lidocaine  20 mL Intradermal Once  . metoprolol tartrate  50 mg Oral BID   Continuous Infusions: . DAPTOmycin (CUBICIN)  IV 600 mg (02/04/20 2024)   PRN Meds:.acetaminophen **OR** acetaminophen, alum &  mag hydroxide-simeth, ondansetron **OR** ondansetron (ZOFRAN) IV, oxyCODONE-acetaminophen **OR** oxyCODONE-acetaminophen, polyethylene glycol  Antibiotics  :   Anti-infectives (From admission, onward)   Start     Dose/Rate Route Frequency Ordered Stop   02/04/20 2000  DAPTOmycin (CUBICIN) 600 mg in sodium chloride 0.9 % IVPB     Discontinue     600  mg 224 mL/hr over 30 Minutes Intravenous Every 48 hours 02/03/20 1426     02/02/20 1430  DAPTOmycin (CUBICIN) 500 mg in sodium chloride 0.9 % IVPB  Status:  Discontinued        500 mg 220 mL/hr over 30 Minutes Intravenous Every 48 hours 02/02/20 1332 02/03/20 1426   02/01/20 0800  meropenem (MERREM) 500 mg in sodium chloride 0.9 % 100 mL IVPB  Status:  Discontinued        500 mg 200 mL/hr over 30 Minutes Intravenous Every 12 hours 02/01/20 0656 02/02/20 1304   02/01/20 0655  vancomycin variable dose per unstable renal function (pharmacist dosing)  Status:  Discontinued         Does not apply See admin instructions 02/01/20 0656 02/02/20 1305   02/01/20 0300  ceFEPIme (MAXIPIME) 2 g in sodium chloride 0.9 % 100 mL IVPB        2 g 200 mL/hr over 30 Minutes Intravenous  Once 02/01/20 0252 02/01/20 0446   02/01/20 0300  vancomycin (VANCOREADY) IVPB 1500 mg/300 mL        1,500 mg 150 mL/hr over 120 Minutes Intravenous  Once 02/01/20 0252 02/01/20 0624       Objective   Vitals:   02/04/20 1300 02/04/20 2132 02/04/20 2230 02/05/20 0428  BP: (!) 186/86 (!) 193/78 (!) 173/77 (!) 159/80  Pulse: (!) 52 63 62 65  Resp: 16 18 18 18   Temp: 98.6 F (37 C) 97.8 F (36.6 C) 98.2 F (36.8 C) 98 F (36.7 C)  TempSrc: Oral Oral Oral Oral  SpO2: 100% 98% 97% 97%  Weight:      Height:        SpO2: 97 % O2 Flow Rate (L/min): 2 L/min  Wt Readings from Last 3 Encounters:  02/04/20 72.3 kg  11/20/19 75.3 kg  05/10/17 83.9 kg     Intake/Output Summary (Last 24 hours) at 02/05/2020 0840 Last data filed at 02/05/2020 0500 Gross per 24 hour  Intake 1555.72 ml  Output 900 ml  Net 655.72 ml    Physical Exam:     Awake Alert, Oriented X 3, Normal affect No new F.N deficits,  [place].AT, Normal respiratory effort on room air, CTAB RRR,No Gallops,Rubs or new Murmurs,  +ve B.Sounds, Abd Soft, No tenderness, No rebound, guarding or rigidity. Multiple wounds around perirectal area with some  necrotic tissue around the edges as well as induration, no active drainage   I have personally reviewed the following:   Data Reviewed:  CBC Recent Labs  Lab 01/31/20 1608 02/01/20 0831 02/02/20 0225 02/04/20 0319  WBC 22.6* 30.5* 21.4* 11.1*  HGB 9.2* 8.8* 8.4* 7.7*  HCT 29.7* 28.3* 25.8* 23.5*  PLT 314 328 340 353  MCV 94.9 94.0 90.8 90.4  MCH 29.4 29.2 29.6 29.6  MCHC 31.0 31.1 32.6 32.8  RDW 12.8 12.9 12.9 12.6  LYMPHSABS 0.7 0.4* 0.4*  --   MONOABS 2.8* 3.0* 1.8*  --   EOSABS 0.2 0.0 0.2  --   BASOSABS 0.1 0.1 0.0  --     Chemistries  Recent Labs  Lab 01/31/20 1608 02/01/20 0831 02/02/20  0225 02/03/20 0402 02/04/20 1240  NA 132* 132* 136 139 142  K 4.7 5.6* 4.5 4.0 4.0  CL 104 103 106 106 102  CO2 13* 12* 17* 21* 30  GLUCOSE 224* 259* 156* 180* 204*  BUN 80* 99* 96* 80* 67*  CREATININE 6.00* 6.93* 6.28* 5.24* 4.19*  CALCIUM 8.2* 8.0* 7.9* 7.7* 7.6*  MG  --   --  1.5*  --   --   AST 27 29 26   --   --   ALT 29 28 29   --   --   ALKPHOS 90 87 84  --   --   BILITOT 0.7 0.8 0.4  --   --    ------------------------------------------------------------------------------------------------------------------ No results for input(s): CHOL, HDL, LDLCALC, TRIG, CHOLHDL, LDLDIRECT in the last 72 hours.  Lab Results  Component Value Date   HGBA1C 7.4 (H) 02/01/2020   ------------------------------------------------------------------------------------------------------------------ No results for input(s): TSH, T4TOTAL, T3FREE, THYROIDAB in the last 72 hours.  Invalid input(s): FREET3 ------------------------------------------------------------------------------------------------------------------ No results for input(s): VITAMINB12, FOLATE, FERRITIN, TIBC, IRON, RETICCTPCT in the last 72 hours.  Coagulation profile Recent Labs  Lab 02/01/20 0304  INR 1.3*    No results for input(s): DDIMER in the last 72 hours.  Cardiac Enzymes No results for input(s):  CKMB, TROPONINI, MYOGLOBIN in the last 168 hours.  Invalid input(s): CK ------------------------------------------------------------------------------------------------------------------ No results found for: BNP  Micro Results Recent Results (from the past 240 hour(s))  Blood Culture (routine x 2)     Status: None (Preliminary result)   Collection Time: 02/01/20  3:04 AM   Specimen: BLOOD RIGHT FOREARM  Result Value Ref Range Status   Specimen Description BLOOD RIGHT FOREARM  Final   Special Requests   Final    BOTTLES DRAWN AEROBIC AND ANAEROBIC Blood Culture adequate volume   Culture   Final    NO GROWTH 4 DAYS Performed at [facility], [address], [place], [place] [address]    Report Status PENDING  Incomplete  SARS Coronavirus 2 by RT PCR (hospital order, performed in [place] hospital lab)     Status: None   Collection Time: 02/01/20  3:04 AM  Result Value Ref Range Status   SARS Coronavirus 2 NEGATIVE NEGATIVE Final    Comment: (NOTE) SARS-CoV-2 target nucleic acids are NOT DETECTED.  The SARS-CoV-2 RNA is generally detectable in upper and lower respiratory specimens during the acute phase of infection. The lowest concentration of SARS-CoV-2 viral copies this assay can detect is 250 copies / mL. A negative result does not preclude SARS-CoV-2 infection and should not be used as the sole basis for treatment or other patient management decisions.  A negative result may occur with improper specimen collection / handling, submission of specimen other than nasopharyngeal swab, presence of viral mutation(s) within the areas targeted by this assay, and inadequate number of viral copies (<250 copies / mL). A negative result must be combined with clinical observations, patient history, and epidemiological information.  Fact Sheet for Patients:   StrictlyIdeas.no  Fact Sheet for Healthcare  Providers: BankingDealers.co.za  This test is not yet approved or  cleared by the [place] FDA and has been authorized for detection and/or diagnosis of SARS-CoV-2 by FDA under an Emergency Use Authorization (EUA).  This EUA will remain in effect (meaning this test can be used) for the duration of the COVID-19 declaration under Section 564(b)(1) of the Act, 21 U.S.C. section 360bbb-3(b)(1), unless the authorization is terminated or revoked sooner.  Performed at [facility], [place] [address], [place], [place] [address]   Wound or Superficial Culture     Status: None   Collection Time: 02/01/20  4:13 AM   Specimen: Buttocks; Wound  Result Value Ref Range Status   Specimen Description BUTTOCKS  Final   Special Requests NONE  Final   Gram Stain   Final    NO WBC SEEN ABUNDANT GRAM POSITIVE COCCI RARE GRAM NEGATIVE RODS RARE GRAM POSITIVE RODS Performed at [facility], [address], [place], [place] [address]    Culture   Final    ABUNDANT METHICILLIN RESISTANT STAPHYLOCOCCUS AUREUS   Report Status 02/03/2020 FINAL  Final   Organism ID, Bacteria METHICILLIN RESISTANT STAPHYLOCOCCUS AUREUS  Final      Susceptibility   Methicillin resistant staphylococcus aureus - MIC*    CIPROFLOXACIN >=8 RESISTANT Resistant     ERYTHROMYCIN >=8 RESISTANT Resistant     GENTAMICIN <=0.5 SENSITIVE Sensitive     OXACILLIN >=4 RESISTANT Resistant     TETRACYCLINE <=1 SENSITIVE Sensitive     VANCOMYCIN 1 SENSITIVE Sensitive     TRIMETH/SULFA <=10 SENSITIVE Sensitive     CLINDAMYCIN <=0.25 SENSITIVE Sensitive     RIFAMPIN <=0.5 SENSITIVE Sensitive     Inducible Clindamycin NEGATIVE Sensitive     * ABUNDANT METHICILLIN RESISTANT STAPHYLOCOCCUS AUREUS  Blood Culture (routine x 2)     Status: Abnormal   Collection Time: 02/01/20  4:48 AM   Specimen: BLOOD  Result Value Ref Range Status   Specimen Description BLOOD RIGHT ANTECUBITAL  Final   Special Requests    Final    BOTTLES DRAWN AEROBIC AND ANAEROBIC Blood Culture adequate volume   Culture  Setup Time   Final    GRAM POSITIVE COCCI IN CLUSTERS IN BOTH AEROBIC AND ANAEROBIC BOTTLES CRITICAL RESULT CALLED TO, READ BACK BY AND VERIFIED WITH: J. LEDFORD,PHARMD 0018 02/02/2020 Mena Goes Performed at [facility], [address], [place], [place] [address]    Culture METHICILLIN RESISTANT STAPHYLOCOCCUS AUREUS (A)  Final   Report Status 02/03/2020 FINAL  Final   Organism ID, Bacteria METHICILLIN RESISTANT STAPHYLOCOCCUS AUREUS  Final      Susceptibility   Methicillin resistant staphylococcus aureus - MIC*    CIPROFLOXACIN >=8 RESISTANT Resistant     ERYTHROMYCIN >=8 RESISTANT Resistant     GENTAMICIN <=0.5 SENSITIVE Sensitive     OXACILLIN >=4 RESISTANT Resistant     TETRACYCLINE <=1 SENSITIVE Sensitive     VANCOMYCIN 1 SENSITIVE Sensitive     TRIMETH/SULFA <=10 SENSITIVE Sensitive     CLINDAMYCIN <=0.25 SENSITIVE Sensitive     RIFAMPIN <=0.5 SENSITIVE Sensitive     Inducible Clindamycin NEGATIVE Sensitive     * METHICILLIN RESISTANT STAPHYLOCOCCUS AUREUS  Blood Culture ID Panel (Reflexed)     Status: Abnormal   Collection Time: 02/01/20  4:48 AM  Result Value Ref Range Status   Enterococcus faecalis NOT DETECTED NOT DETECTED Final   Enterococcus Faecium NOT DETECTED NOT DETECTED Final   Listeria monocytogenes NOT DETECTED NOT DETECTED Final   Staphylococcus species DETECTED (A) NOT DETECTED Final    Comment: CRITICAL RESULT CALLED TO, READ BACK BY AND VERIFIED WITH: J. LEDFORD,PHARMD 0018 02/02/2020 T. TYSOR    Staphylococcus aureus (BCID) DETECTED (A) NOT DETECTED Final    Comment: Methicillin (oxacillin)-resistant Staphylococcus aureus (MRSA). MRSA is predictably resistant to beta-lactam antibiotics (except ceftaroline). Preferred therapy is vancomycin unless clinically contraindicated. Patient requires contact precautions if  hospitalized. CRITICAL RESULT CALLED TO, READ BACK BY  AND VERIFIED WITH: J. LEDFORD,PHARMD 0018 02/02/2020 T. TYSOR    Staphylococcus epidermidis NOT DETECTED NOT DETECTED Final   Staphylococcus lugdunensis NOT DETECTED NOT DETECTED Final   Streptococcus species NOT DETECTED NOT DETECTED Final   Streptococcus agalactiae NOT DETECTED NOT DETECTED Final   Streptococcus pneumoniae NOT DETECTED NOT DETECTED Final   Streptococcus pyogenes NOT DETECTED NOT DETECTED Final   A.calcoaceticus-baumannii NOT DETECTED NOT DETECTED Final   Bacteroides fragilis NOT DETECTED NOT DETECTED Final   Enterobacterales NOT DETECTED NOT DETECTED Final   Enterobacter cloacae complex NOT DETECTED NOT DETECTED Final   Escherichia coli NOT DETECTED NOT DETECTED Final   Klebsiella aerogenes NOT DETECTED NOT DETECTED Final   Klebsiella oxytoca NOT DETECTED NOT DETECTED Final   Klebsiella pneumoniae NOT DETECTED NOT DETECTED Final   Proteus species NOT DETECTED NOT DETECTED Final   Salmonella species NOT DETECTED NOT DETECTED Final   Serratia marcescens NOT DETECTED NOT DETECTED Final   Haemophilus influenzae NOT DETECTED NOT DETECTED Final   Neisseria meningitidis NOT DETECTED NOT DETECTED Final   Pseudomonas aeruginosa NOT DETECTED NOT DETECTED Final   Stenotrophomonas maltophilia NOT DETECTED NOT DETECTED Final   Candida albicans NOT DETECTED NOT DETECTED Final   Candida auris NOT DETECTED NOT DETECTED Final   Candida glabrata NOT DETECTED NOT DETECTED Final   Candida krusei NOT DETECTED NOT DETECTED Final   Candida parapsilosis NOT DETECTED NOT DETECTED Final   Candida tropicalis NOT DETECTED NOT DETECTED Final   Cryptococcus neoformans/gattii NOT DETECTED NOT DETECTED Final   Meth resistant mecA/C and MREJ DETECTED (A) NOT DETECTED Final    Comment: CRITICAL RESULT CALLED TO, READ BACK BY AND VERIFIED WITH: J. LEDFORD,PHARMD 0018 02/02/2020 T. TYSOR Performed at [facility], [address], [place], [place] [address]   Aerobic/Anaerobic Culture  (surgical/deep wound)     Status: None (Preliminary result)   Collection Time: 02/01/20 11:31 AM   Specimen: Buttocks; Abscess  Result Value Ref Range Status   Specimen Description ABSCESS  Final   Special Requests BUTTOCKS  Final   Gram Stain   Final    RARE WBC PRESENT, PREDOMINANTLY PMN FEW GRAM POSITIVE COCCI Performed at [facility], [place] [address], [place], [place] [address]    Culture   Final    MODERATE METHICILLIN RESISTANT STAPHYLOCOCCUS AUREUS NO ANAEROBES ISOLATED; CULTURE IN PROGRESS FOR 5 DAYS    Report Status PENDING  Incomplete   Organism ID, Bacteria METHICILLIN RESISTANT STAPHYLOCOCCUS AUREUS  Final      Susceptibility   Methicillin resistant staphylococcus aureus - MIC*    CIPROFLOXACIN >=8 RESISTANT Resistant     ERYTHROMYCIN >=8 RESISTANT Resistant     GENTAMICIN <=0.5 SENSITIVE Sensitive     OXACILLIN >=4 RESISTANT Resistant     TETRACYCLINE <=1 SENSITIVE Sensitive     VANCOMYCIN 1 SENSITIVE Sensitive     TRIMETH/SULFA <=10 SENSITIVE Sensitive     CLINDAMYCIN <=0.25 SENSITIVE Sensitive     RIFAMPIN <=0.5 SENSITIVE Sensitive     Inducible Clindamycin NEGATIVE Sensitive     * MODERATE METHICILLIN RESISTANT STAPHYLOCOCCUS AUREUS  Urine culture     Status: Abnormal (Preliminary result)   Collection Time: 02/02/20  5:05 AM   Specimen: In/Out Cath Urine  Result Value Ref Range Status   Specimen Description IN/OUT CATH URINE  Final   Special Requests NONE  Final   Culture (A)  Final    400 COLONIES/mL GRAM NEGATIVE  RODS IDENTIFICATION AND SUSCEPTIBILITIES TO FOLLOW Performed at [facility], [place] [address] [place], [place] [address]    Report Status PENDING  Incomplete  MRSA PCR Screening     Status: None   Collection Time: 02/02/20  5:16 AM   Specimen: Nasal Mucosa; Nasopharyngeal  Result Value Ref Range Status   MRSA by PCR NEGATIVE NEGATIVE Final    Comment:        The GeneXpert MRSA Assay (FDA approved for NASAL specimens only), is  one component of a comprehensive MRSA colonization surveillance program. It is not intended to diagnose MRSA infection nor to guide or monitor treatment for MRSA infections. Performed at [facility], [place] [address], [place], [place] [address]   Culture, blood (routine x 2)     Status: None (Preliminary result)   Collection Time: 02/03/20  4:02 AM   Specimen: BLOOD  Result Value Ref Range Status   Specimen Description BLOOD SITE NOT SPECIFIED  Final   Special Requests   Final    BOTTLES DRAWN AEROBIC ONLY Blood Culture results may not be optimal due to an inadequate volume of blood received in culture bottles   Culture   Final    NO GROWTH 2 DAYS Performed at [facility], [place] [address], [place], [place] [address]    Report Status PENDING  Incomplete  Culture, blood (routine x 2)     Status: None (Preliminary result)   Collection Time: 02/03/20  4:13 AM   Specimen: BLOOD  Result Value Ref Range Status   Specimen Description BLOOD SITE NOT SPECIFIED  Final   Special Requests   Final    BOTTLES DRAWN AEROBIC ONLY Blood Culture results may not be optimal due to an inadequate volume of blood received in culture bottles   Culture   Final    NO GROWTH 2 DAYS Performed at [facility], [place] [address] [place], [place] [address]    Report Status PENDING  Incomplete    Radiology Reports CT ABDOMEN PELVIS WO CONTRAST  Result Date: 02/01/2020 CLINICAL DATA:  Abdominal abscess/infection suspected Nausea. Patient reports decreased appetite. Patient reports buttock abscess. EXAM: CT ABDOMEN AND PELVIS WITHOUT CONTRAST TECHNIQUE: Multidetector CT imaging of the abdomen and pelvis was performed following the standard protocol without IV contrast. COMPARISON:  CT 03/06/2017 FINDINGS: Lower chest: Minimal subpleural scarring in the left lower lobe. No acute airspace disease. No pleural effusion. Hepatobiliary: No focal hepatic abnormality on noncontrast exam. Multiple calcified  gallstones without pericholecystic inflammation. There is no biliary dilatation. Pancreas: No ductal dilatation or inflammation. Spleen: Normal in size. No focal abnormality on noncontrast exam. Coils again seen in the splenic artery. Adrenals/Urinary Tract: Previous left adrenal hematoma has resolved, there is mild residual adrenal thickening. Normal right adrenal gland. Mild left hydronephrosis and hydroureter to the level of the distal pelvis. No stone or cause for obstruction. Mild prominence of the right ureter without hydronephrosis. There is symmetric bilateral perinephric edema. Urinary bladder is minimally distended. Stomach/Bowel: Small hiatal hernia. Stomach otherwise unremarkable. There is no bowel obstruction. No evidence of bowel wall thickening or inflammation. Few lower pelvic small bowel loops are fluid-filled but nondilated or inflamed. Normal appendix. High-riding cecum. Moderate volume of colonic stool. There is colonic tortuosity. No colonic wall thickening or inflammation. Vascular/Lymphatic: Abdominal aorta is normal in caliber. Coiling of the splenic artery. Multiple small retroperitoneal lymph nodes, not enlarged by size criteria and likely reactive. Small bilateral inguinal nodes. Reproductive: Prostate  unremarkable. Right testis may be within the inguinal canal, series 3, image 86. Other: No ascites or free air. Subcutaneous fluid collection involving the left gluteal soft tissues about the gluteal crease measuring 2.8 x 1.4 x 4.1 cm. Mild adjacent soft tissue edema tracks into the gluteal fold. Fluid collection in the inferior right gluteal soft tissues abutting the gluteal crease measures 3.0 x 2.4 x 3.8 cm. There is no air within either fluid collection or subcutaneous soft tissues. No extension into the anorectum. Musculoskeletal: Bilateral sacroiliac joint pinning and pubic symphyseal fixation. Remote left transverse process fractures in the lumbar spine. Remote left rib fractures. No  acute osseous abnormalities. IMPRESSION: 1. Two gluteal fluid collections, 1 on the right and 1 on the left. Subcutaneous fluid collection involving the left gluteal soft tissues abut the gluteal crease measuring 2.8 x 1.4 x 4.1 cm, suspicious for abscess. Fluid collection in the inferior right gluteal soft tissues abutting the gluteal crease measures 3.0 x 2.4 x 3.8 cm. No air within either fluid collection or subcutaneous soft tissues. 2. Mild left hydronephrosis and hydroureter to the level of the distal pelvis. No stone or cause for obstruction. Bilateral perinephric edema, nonspecific. Decompressed urinary bladder which appears thick walled. 3. Cholelithiasis without gallbladder inflammation. 4. Small hiatal hernia. 5. Right testis may be within the inguinal canal. Recommend correlation with physical exam. Electronically Signed   By: Keith Rake M.D.   On: 02/01/2020 03:39   US RENAL  Result Date: 02/03/2020 CLINICAL DATA:  Acute kidney injury. EXAM: RENAL / URINARY TRACT ULTRASOUND COMPLETE COMPARISON:  CT 02/01/2020, renal ultrasound 01/03/2020 FINDINGS: Right Kidney: Renal measurements: 11.7 x 5.6 x 5.3 cm = volume: 180 mL. Mild increased renal parenchymal echogenicity. No mass or hydronephrosis visualized. Left Kidney: Renal measurements: 10.1 x 5.3 x 5.2 cm = volume: 146 mL. Mild hydronephrosis. Renal pelvis measures 12 mm in dimension. Mild increased renal echogenicity. No evidence of focal lesion. Bladder: Appears normal for degree of bladder distention. Other: Incidental gallstones. IMPRESSION: 1. Mild left hydronephrosis. This is unchanged from prior CT as well as renal ultrasound last month. Cause for obstruction of stab list. 2. Mild increased renal echogenicity consistent with chronic medical renal disease. Electronically Signed   By: Keith Rake M.D.   On: 02/03/2020 23:07   DG Chest Portable 1 View  Result Date: 02/01/2020 CLINICAL DATA:  Cough. EXAM: PORTABLE CHEST 1 VIEW  COMPARISON:  Radiograph 04/01/2017 FINDINGS: Heart is normal in size. Normal mediastinal contours. Remote left rib fractures with minor scarring at the left lung base. No acute or confluent airspace disease. No pleural fluid or pneumothorax. No pulmonary edema. Left upper quadrant vascular coils. IMPRESSION: 1. No acute abnormality. 2. Remote left rib fractures with minor scarring at the left lung base. Electronically Signed   By: Keith Rake M.D.   On: 02/01/2020 03:41   ECHOCARDIOGRAM COMPLETE  Result Date: 02/02/2020    ECHOCARDIOGRAM REPORT   Patient Name:   RUBERT FREDIANI Date of Exam: 02/02/2020 Medical Rec #:  [id number]        Height:       70.0 in Accession #:    [id number]       Weight:       159.4 lb Date of Birth:  [birth year]       BSA:          1.895 m Patient Age:    81 years         BP:  142/84 mmHg Patient Gender: M                HR:           84 bpm. Exam Location:  Inpatient Procedure: 2D Echo Indications:    bacteremia  History:        Patient has no prior history of Echocardiogram examinations.                 Risk Factors:Diabetes and Hypertension.  Sonographer:    Jannett Celestine RDCS (AE) Referring Phys: [place]  1. There is a linear independently mobile echodensity in the LVOT (see image 30). This cannot be clearly traced back to a structure, but aortic valve endocarditis not excluded. Left ventricular ejection fraction, by estimation, is 65 to 70%. The left ventricle has normal function. The left ventricle has no regional wall motion abnormalities. Left ventricular diastolic parameters were normal.  2. Right ventricular systolic function is normal. The right ventricular size is normal. Tricuspid regurgitation signal is inadequate for assessing PA pressure.  3. The mitral valve is normal in structure. Trivial mitral valve regurgitation. No evidence of mitral stenosis.  4. Bicuspid aortic valve, with fusion of the RCC and LCC (image 20) with focal  calcification. Mild AI. Marland Kitchen The aortic valve is bicuspid. Aortic valve regurgitation is mild. Mild aortic valve sclerosis is present, with no evidence of aortic valve stenosis.  5. The inferior vena cava is normal in size with greater than 50% respiratory variability, suggesting right atrial pressure of 3 mmHg. Comparison(s): No prior Echocardiogram. Conclusion(s)/Recommendation(s): Bicuspid aortic valve. There is an independently mobile linear echodensity seen in the LVOT. Given bacteremia, would recommend TEE to further evaluate for endocarditis. FINDINGS  Left Ventricle: There is a linear independently mobile echodensity in the LVOT (see image 30). This cannot be clearly traced back to a structure, but aortic valve endocarditis not excluded. Left ventricular ejection fraction, by estimation, is 65 to 70%. The left ventricle has normal function. The left ventricle has no regional wall motion abnormalities. The left ventricular internal cavity size was normal in size. There is borderline left ventricular hypertrophy. Left ventricular diastolic parameters were normal. Right Ventricle: The right ventricular size is normal. No increase in right ventricular wall thickness. Right ventricular systolic function is normal. Tricuspid regurgitation signal is inadequate for assessing PA pressure. Left Atrium: Left atrial size was normal in size. Right Atrium: Right atrial size was normal in size. Pericardium: There is no evidence of pericardial effusion. Mitral Valve: The mitral valve is normal in structure. Trivial mitral valve regurgitation. No evidence of mitral valve stenosis. Tricuspid Valve: The tricuspid valve is normal in structure. Tricuspid valve regurgitation is trivial. No evidence of tricuspid stenosis. Aortic Valve: Bicuspid aortic valve, with fusion of the RCC and LCC (image 20) with focal calcification. Mild AI. The aortic valve is bicuspid. . There is mild thickening and mild calcification of the aortic valve.  Aortic valve regurgitation is mild. Mild aortic valve sclerosis is present, with no evidence of aortic valve stenosis. There is mild thickening of the aortic valve. There is mild calcification of the aortic valve. Pulmonic Valve: The pulmonic valve was grossly normal. Pulmonic valve regurgitation is trivial. Aorta: The aortic root was not well visualized, the ascending aorta was not well visualized and the aortic arch was not well visualized. Venous: The inferior vena cava is normal in size with greater than 50% respiratory variability, suggesting right atrial pressure of 3 mmHg. IAS/Shunts: The  atrial septum is grossly normal.  LEFT VENTRICLE PLAX 2D LVIDd:         4.70 cm  Diastology LVIDs:         2.80 cm  LV e' lateral:   7.62 cm/s LV PW:         1.10 cm  LV E/e' lateral: 9.7 LV IVS:        1.20 cm  LV e' medial:    5.98 cm/s LVOT diam:     2.20 cm  LV E/e' medial:  12.4 LV SV:         87 LV SV Index:   46 LVOT Area:     3.80 cm  RIGHT VENTRICLE RV S prime:     9.46 cm/s TAPSE (M-mode): 1.7 cm LEFT ATRIUM           Index       RIGHT ATRIUM          Index LA diam:      3.00 cm 1.58 cm/m  RA Area:     8.89 cm LA Vol (A2C): 20.6 ml 10.87 ml/m RA Volume:   15.00 ml 7.91 ml/m LA Vol (A4C): 21.9 ml 11.55 ml/m  AORTIC VALVE LVOT Vmax:   95.30 cm/s LVOT Vmean:  71.000 cm/s LVOT VTI:    0.229 m  AORTA Ao Root diam: 3.60 cm MITRAL VALVE MV Area (PHT): 3.27 cm     SHUNTS MV Decel Time: 232 msec     Systemic VTI:  0.23 m MV E velocity: 74.10 cm/s   Systemic Diam: 2.20 cm MV A velocity: 102.00 cm/s MV E/A ratio:  0.73 Buford Dresser MD Electronically signed by Buford Dresser MD Signature Date/Time: 02/02/2020/4:39:02 PM    Final    ECHO TEE  Result Date: 02/04/2020    TRANSESOPHOGEAL ECHO REPORT   Patient Name:   DERRICK TIEGS Date of Exam: 02/04/2020 Medical Rec #:  [id number]        Height:       70.0 in Accession #:    [id number]       Weight:       159.4 lb Date of Birth:  1960/11/16       BSA:           1.895 m Patient Age:    16 years         BP:           156/81 mmHg Patient Gender: M                HR:           64 bpm. Exam Location:  Inpatient Procedure: Transesophageal Echo and Color Doppler Indications:     Bacteremia  History:         Patient has prior history of Echocardiogram examinations, most                  recent 02/02/2020. Risk Factors:Diabetes, Hypertension and                  Dyslipidemia. CKD.  Sonographer:     Clayton Lefort RDCS (AE) Referring Phys:  [id number] [place] Diagnosing Phys: Buford Dresser MD PROCEDURE: After discussion of the risks and benefits of a TEE, an informed consent was obtained from the patient. The transesophogeal probe was passed without difficulty through the esophogus of the patient. Local oropharyngeal anesthetic was provided with viscous lidocaine. Sedation performed by different physician. The patient was monitored while  under deep sedation. Anesthestetic sedation was provided intravenously by Anesthesiology: 139.3mg  of Propofol. Image quality was good. The patient's vital signs; including heart rate, blood pressure, and oxygen saturation; remained stable throughout the procedure. The patient developed no complications during the procedure. IMPRESSIONS  1. Left ventricular ejection fraction, by estimation, is 65 to 70%. The left ventricle has normal function. The left ventricle has no regional wall motion abnormalities.  2. Right ventricular systolic function is normal. The right ventricular size is normal.  3. No left atrial/left atrial appendage thrombus was detected.  4. The mitral valve is normal in structure. Trivial mitral valve regurgitation. No evidence of mitral stenosis.  5. The aortic valve is bicuspid. Aortic valve regurgitation is trivial. No aortic stenosis is present.  6. There is mild (Grade II) plaque involving the descending aorta. Conclusion(s)/Recommendation(s): No evidence of endocarditis. Bicuspid aortic valve. Structure seen on  TTE is associated with the mitral valve, not the aortic, near the mitral-aortic annular junction. This appears to be a chordae, not consistent with vegetation. FINDINGS  Left Ventricle: Left ventricular ejection fraction, by estimation, is 65 to 70%. The left ventricle has normal function. The left ventricle has no regional wall motion abnormalities. The left ventricular internal cavity size was normal in size. There is  borderline left ventricular hypertrophy. Right Ventricle: The right ventricular size is normal. No increase in right ventricular wall thickness. Right ventricular systolic function is normal. Left Atrium: Left atrial size was not assessed. No left atrial/left atrial appendage thrombus was detected. Right Atrium: Right atrial size was not assessed. Pericardium: There is no evidence of pericardial effusion. Mitral Valve: The mitral valve is normal in structure. Trivial mitral valve regurgitation. No evidence of mitral valve stenosis. Tricuspid Valve: The tricuspid valve is normal in structure. Tricuspid valve regurgitation is trivial. No evidence of tricuspid stenosis. Aortic Valve: The aortic valve is bicuspid. Aortic valve regurgitation is trivial. No aortic stenosis is present. Pulmonic Valve: The pulmonic valve was grossly normal. Pulmonic valve regurgitation is trivial. Aorta: The aortic root is normal in size and structure. There is mild (Grade II) plaque involving the descending aorta. IAS/Shunts: No atrial level shunt detected by color flow Doppler. Buford Dresser MD Electronically signed by Buford Dresser MD Signature Date/Time: 02/04/2020/5:29:42 PM    Final      Time Spent in minutes  30     Desiree Hane M.D on 02/05/2020 at 8:40 AM  To page go to [email or web address] - password [email or web address]

## 2020-02-05 NOTE — Procedures (Signed)
Interventional Radiology Procedure Note  Procedure: RT IJ TUNNELED DL POWER PICC    Complications: None  Estimated Blood Loss:  MIN  Findings: TIP SVCRA    Tamera Punt, MD

## 2020-02-05 NOTE — Progress Notes (Signed)
PHARMACY CONSULT NOTE FOR:  OUTPATIENT  PARENTERAL ANTIBIOTIC THERAPY (OPAT)  Indication: MRSA bacteremia/deep tissue infection of buttocks  Regimen: Daptomycin 600 mg q 48 hours  End date: 03/02/2020  IV antibiotic discharge orders are pended. To discharging provider:  please sign these orders via discharge navigator,  Select New Orders & click on the button choice - Manage This Unsigned Work.     Thank you for allowing pharmacy to be a part of this patient's care.  Jimmy Footman, PharmD, BCPS, BCIDP Infectious Diseases Clinical Pharmacist Phone: [phone] 02/05/2020, 1:34 PM

## 2020-02-05 NOTE — Progress Notes (Signed)
Physical Therapy Wound Treatment Patient Details  Name: Peter [name] MRN: [id number] Date of Birth: 1960/09/01  Today's Date: 02/05/2020 Time: 5051-8335 Time Calculation (min): 62 min  Subjective  Subjective: Pt just finished lunch and waiting on hydrotherapy Patient and Family Stated Goals: heal wounds Date of Onset: 02/03/20 Prior Treatments: topical treatment  Pain Score: Pain Score: 3  (Hydrotherapy at 1315)  Wound Assessment     Wound / Incision (Open or Dehisced) 02/04/20 Incision - Open Buttocks Right Right superior buttock wound  (Active)  Wound Image   02/04/20 1300  Dressing Type Barrier Film (skin prep);Gauze (Comment);Normal saline moist dressing;Mesh briefs 02/05/20 1400  Dressing Changed Changed 02/05/20 1400  Dressing Status Clean;Dry;Intact 02/05/20 1400  Dressing Change Frequency Other (Comment) 02/05/20 1400  Site / Wound Assessment Brown;Yellow;Red;Pink;Granulation tissue 02/05/20 1400  % Wound base Red or Granulating 40% 02/05/20 1400  % Wound base Yellow/Fibrinous Exudate 50% 02/05/20 1400  % Wound base Black/Eschar 10% 02/05/20 1400  Peri-wound Assessment Induration;Erythema (non-blanchable);Denuded 02/05/20 1400  Wound Length (cm) 3.2 cm 02/04/20 1300  Wound Width (cm) 2.4 cm 02/04/20 1300  Wound Depth (cm) 1.9 cm 02/04/20 1300  Wound Volume (cm^3) 14.59 cm^3 02/04/20 1300  Wound Surface Area (cm^2) 7.68 cm^2 02/04/20 1300  Margins Unattached edges (unapproximated) 02/05/20 1400  Closure None 02/05/20 1400  Drainage Amount Other (Comment) 02/05/20 1400  Treatment Debridement (Selective);Hydrotherapy (Pulse lavage);Packing (Saline gauze) 02/05/20 1400     Wound / Incision (Open or Dehisced) 02/04/20 Incision - Open Buttocks Right R central buttock incision  (Active)  Wound Image   02/04/20 1300  Dressing Type Barrier Film (skin prep);Gauze (Comment);Normal saline moist dressing 02/05/20 1400  Dressing Changed Changed 02/05/20 1400  Dressing Status  Clean;Dry;Intact 02/05/20 1400  Dressing Change Frequency PRN 02/04/20 1300  Site / Wound Assessment Yellow;Friable;Pink 02/05/20 1400  % Wound base Red or Granulating 0% 02/05/20 1400  % Wound base Yellow/Fibrinous Exudate 100% 02/05/20 1400  % Wound base Black/Eschar 0% 02/05/20 1400  % Wound base Other/Granulation Tissue (Comment) 0% 02/05/20 1400  Peri-wound Assessment Erythema (non-blanchable);Denuded;Induration 02/05/20 1400  Wound Length (cm) 3.3 cm 02/04/20 1300  Wound Width (cm) 0.7 cm 02/04/20 1300  Wound Depth (cm) 1.2 cm 02/04/20 1300  Wound Volume (cm^3) 2.77 cm^3 02/04/20 1300  Wound Surface Area (cm^2) 2.31 cm^2 02/04/20 1300  Margins Unattached edges (unapproximated) 02/05/20 1400  Closure None 02/05/20 1400  Drainage Amount Other (Comment) 02/05/20 1400  Drainage Description Other (Comment) 02/05/20 1400  Treatment Debridement (Selective);Hydrotherapy (Pulse lavage);Packing (Saline gauze) 02/05/20 1400     Wound / Incision (Open or Dehisced) 02/04/20 Incision - Open Buttocks Right R inferior buttock main  (Active)  Wound Image   02/04/20 1300  Dressing Type Barrier Film (skin prep);Gauze (Comment);Normal saline moist dressing;Mesh briefs 02/05/20 1400  Dressing Changed Changed 02/05/20 1400  Dressing Status Clean;Dry;Intact 02/05/20 1400  Dressing Change Frequency Other (Comment) 02/05/20 1400  Site / Wound Assessment Yellow;Pink;Friable 02/05/20 1400  % Wound base Red or Granulating 0% 02/05/20 1400  % Wound base Yellow/Fibrinous Exudate 100% 02/05/20 1400  % Wound base Black/Eschar 0% 02/05/20 1400  % Wound base Other/Granulation Tissue (Comment) 0% 02/05/20 1400  Peri-wound Assessment Denuded;Erythema (non-blanchable);Induration 02/05/20 1400  Wound Length (cm) 5.2 cm 02/04/20 1300  Wound Width (cm) 3.3 cm 02/04/20 1300  Wound Depth (cm) 2.2 cm 02/04/20 1300  Wound Volume (cm^3) 37.75 cm^3 02/04/20 1300  Wound Surface Area (cm^2) 17.16 cm^2 02/04/20 1300   Margins Unattached edges (unapproximated) 02/05/20 1400  Closure None 02/05/20 1400  Drainage Amount Other (Comment) 02/05/20 1400  Drainage Description Other (Comment) 02/05/20 1400  Treatment Debridement (Selective);Hydrotherapy (Pulse lavage);Packing (Saline gauze) 02/05/20 1400     Wound / Incision (Open or Dehisced) 02/04/20 Incision - Open Buttocks Right R Inferior buttock satellite (will likely merge with R inferior buttock main) (Active)  Dressing Type Barrier Film (skin prep);Gauze (Comment);Normal saline moist dressing;Mesh briefs 02/05/20 1400  Dressing Changed New 02/05/20 1400  Dressing Status Clean;Dry;Intact 02/05/20 1400  Dressing Change Frequency Other (Comment) 02/05/20 1400  Site / Wound Assessment Yellow;Pink 02/05/20 1400  % Wound base Red or Granulating 10% 02/05/20 1400  % Wound base Yellow/Fibrinous Exudate 90% 02/05/20 1400  % Wound base Black/Eschar 0% 02/05/20 1400  % Wound base Other/Granulation Tissue (Comment) 0% 02/05/20 1400  Peri-wound Assessment Induration;Erythema (non-blanchable);Denuded 02/05/20 1400  Wound Length (cm) 1.1 cm 02/04/20 1300  Wound Width (cm) 0.8 cm 02/04/20 1300  Wound Depth (cm) 0 cm 02/04/20 1300  Wound Volume (cm^3) 0 cm^3 02/04/20 1300  Wound Surface Area (cm^2) 0.88 cm^2 02/04/20 1300  Margins Unattached edges (unapproximated) 02/05/20 1400  Closure None 02/05/20 1400  Drainage Amount Other (Comment) 02/05/20 1400  Drainage Description Other (Comment) 02/05/20 1400  Treatment Debridement (Selective);Hydrotherapy (Pulse lavage);Packing (Saline gauze) 02/05/20 1400     Wound / Incision (Open or Dehisced) 02/04/20 Incision - Open Buttocks Left L central buttock  (Active)  Wound Image   02/04/20 1400  Dressing Type Barrier Film (skin prep);Gauze (Comment);Normal saline moist dressing 02/05/20 1400  Dressing Changed New 02/05/20 1400  Dressing Status Clean;Dry;Intact 02/05/20 1400  Dressing Change Frequency Other (Comment)  02/05/20 1400  Site / Wound Assessment Granulation tissue;Red;Yellow;Brown;Friable 02/05/20 1400  % Wound base Red or Granulating 10% 02/05/20 1400  % Wound base Yellow/Fibrinous Exudate 60% 02/05/20 1400  % Wound base Black/Eschar 30% 02/05/20 1400  Peri-wound Assessment Erythema (non-blanchable);Denuded;Induration 02/05/20 1400  Wound Length (cm) 4.8 cm 02/04/20 1400  Wound Width (cm) 1.3 cm 02/04/20 1400  Wound Depth (cm) 2.4 cm 02/04/20 1400  Wound Volume (cm^3) 14.98 cm^3 02/04/20 1400  Wound Surface Area (cm^2) 6.24 cm^2 02/04/20 1400  Margins Unattached edges (unapproximated) 02/05/20 1400  Closure None 02/05/20 1400  Drainage Amount Other (Comment) 02/05/20 1400  Drainage Description Other (Comment) 02/05/20 1400  Treatment Debridement (Selective);Hydrotherapy (Pulse lavage);Packing (Saline gauze) 02/05/20 1400     Wound / Incision (Open or Dehisced) 02/04/20 Incision - Open Buttocks Left L inferior buttock  (Active)  Wound Image   02/04/20 1300  Dressing Type Barrier Film (skin prep);Mesh briefs;Gauze (Comment);Normal saline moist dressing 02/05/20 1400  Dressing Changed New 02/05/20 1400  Dressing Status Clean;Dry;Intact 02/05/20 1400  Dressing Change Frequency Other (Comment) 02/05/20 1400  % Wound base Red or Granulating 0% 02/05/20 1400  % Wound base Yellow/Fibrinous Exudate 50% 02/05/20 1400  % Wound base Black/Eschar 50% 02/05/20 1400  % Wound base Other/Granulation Tissue (Comment) 0% 02/05/20 1400  Wound Length (cm) 4.7 cm 02/04/20 1400  Wound Width (cm) 1.2 cm 02/04/20 1400  Wound Depth (cm) 1.8 cm 02/04/20 1400  Wound Volume (cm^3) 10.15 cm^3 02/04/20 1400  Wound Surface Area (cm^2) 5.64 cm^2 02/04/20 1400  Margins Unattached edges (unapproximated) 02/05/20 1400  Closure None 02/05/20 1400  Drainage Amount Other (Comment) 02/05/20 1400  Drainage Description Other (Comment) 02/05/20 1400  Treatment Debridement (Selective);Hydrotherapy (Pulse lavage);Packing  (Saline gauze) 02/05/20 1400      Hydrotherapy Pulsed lavage therapy - wound location: all buttock wounds Pulsed Lavage with Suction (psi): 12  psi Pulsed Lavage with Suction - Normal Saline Used: 2000 mL Pulsed Lavage Tip: Tip with splash shield Selective Debridement Selective Debridement - Location: L and R buttock wounds  Selective Debridement - Tools Used: Forceps;Scalpel Selective Debridement - Tissue Removed: necrotic fibrin and eschar/cauterized tissue   Wound Assessment and Plan  Wound Therapy - Assess/Plan/Recommendations Wound Therapy - Clinical Statement: Surgical PA had removed all dressings to assess wounds prior to hydrotherapy arrival. Pt bottom clean and dry. All wound beds moist with little drainage. Pt continues to have decreased pain in wounds despite depth and breadth. Pt will continue to benefit from pulse lavage and selective debridement to reduce bioburden and promote healing.  Wound Therapy - Functional Problem List: uncontrolled DM Factors Delaying/Impairing Wound Healing: Diabetes Mellitus;Altered sensation;Infection - systemic/local;Multiple medical problems Hydrotherapy Plan: Debridement;Dressing change;Pulsatile lavage with suction;Patient/family education Wound Therapy - Frequency: 6X / week Wound Therapy - Follow Up Recommendations: [place] Wound Plan: see above  Wound Therapy Goals- Improve the function of patient's integumentary system by progressing the wound(s) through the phases of wound healing (inflammation - proliferation - remodeling) by: Decrease Necrotic Tissue to: 0 Decrease Necrotic Tissue - Progress: Progressing toward goal Increase Granulation Tissue to: 100 Increase Granulation Tissue - Progress: Progressing toward goal Goals/treatment plan/discharge plan were made with and agreed upon by patient/family: Yes Time For Goal Achievement: 7 days Wound Therapy - Potential for Goals: Fair  Goals will be updated until maximal potential  achieved or discharge criteria met.  Discharge criteria: when goals achieved, discharge from hospital, MD decision/surgical intervention, no progress towards goals, refusal/missing three consecutive treatments without notification or medical reason.  GP    Peter [name]. Migdalia Dk PT, DPT Acute Rehabilitation Services Pager [phone] Office [phone]  [place] 02/05/2020, 2:57 PM

## 2020-02-05 NOTE — Progress Notes (Signed)
[facility]    Date of Admission:  01/31/2020   Total days of antibiotics 5          ID: Peter [name] is a 59 y.o. male with  Principal Problem:   Cellulitis and abscess of buttock Active Problems:   Uncontrolled type 2 diabetes mellitus with diabetic nephropathy, without long-term current use of insulin (HCC)   Essential hypertension   Mixed diabetic hyperlipidemia associated with type 2 diabetes mellitus (HCC)   Acute renal failure superimposed on stage 4 chronic kidney disease (HCC)   Metabolic acidosis   Sepsis with acute renal failure without septic shock (HCC)    Subjective: Afebrile, having some pain with hydrotherapy, but didn't receive pain meds before dressing changes  Medications:   heparin injection (subcutaneous)  5,000 Units Subcutaneous Q8H   insulin aspart  0-15 Units Subcutaneous TID AC & HS   lidocaine  20 mL Intradermal Once   metoprolol tartrate  50 mg Oral BID    Objective: Vital signs in last 24 hours: Temp:  [97.8 F (36.6 C)-98.6 F (37 C)] 98 F (36.7 C) (08/18 0428) Pulse Rate:  [52-65] 65 (08/18 0428) Resp:  [16-18] 18 (08/18 0428) BP: (159-193)/(77-86) 159/80 (08/18 0428) SpO2:  [97 %-100 %] 97 % (08/18 0428) Physical Exam  Constitutional: He is oriented to person, place, and time. He appears well-developed and well-nourished. No distress.  HENT:  Mouth/Throat: Oropharynx is clear and moist. No oropharyngeal exudate.  Cardiovascular: Normal rate, regular rhythm and normal heart sounds. Exam reveals no gallop and no friction rub.  No murmur heard.  Pulmonary/Chest: Effort normal and breath sounds normal. No respiratory distress. He has no wheezes.  Abdominal: Soft. Bowel sounds are normal. He exhibits no distension. There is no tenderness.  Buttocks = dressing changes to affected area Neurological: He is alert and oriented to person, place, and time.  Skin: Skin is warm and dry. No rash noted. No  erythema.  Psychiatric: He has a normal mood and affect. His behavior is normal.     Lab Results Recent Labs    02/03/20 0402 02/04/20 0319 02/04/20 1240 02/05/20 0959  WBC  --  11.1*  --  10.8*  HGB  --  7.7*  --  7.6*  HCT  --  23.5*  --  24.1*  NA 139  --  142  --   K 4.0  --  4.0  --   CL 106  --  102  --   CO2 21*  --  30  --   BUN 80*  --  67*  --   CREATININE 5.24*  --  4.19*  --    Liver Panel Recent Labs    02/03/20 0402  ALBUMIN 2.0*    Microbiology: 8/15 blood cx ngtd 8/14 blood cx MRSA Studies/Results: US RENAL  Result Date: 02/03/2020 CLINICAL DATA:  Acute kidney injury. EXAM: RENAL / URINARY TRACT ULTRASOUND COMPLETE COMPARISON:  CT 02/01/2020, renal ultrasound 01/03/2020 FINDINGS: Right Kidney: Renal measurements: 11.7 x 5.6 x 5.3 cm = volume: 180 mL. Mild increased renal parenchymal echogenicity. No mass or hydronephrosis visualized. Left Kidney: Renal measurements: 10.1 x 5.3 x 5.2 cm = volume: 146 mL. Mild hydronephrosis. Renal pelvis measures 12 mm in dimension. Mild increased renal echogenicity. No evidence of focal lesion. Bladder: Appears normal for degree of bladder distention. Other: Incidental gallstones. IMPRESSION: 1. Mild left hydronephrosis. This is unchanged from prior CT as well as renal ultrasound last  month. Cause for obstruction of stab list. 2. Mild increased renal echogenicity consistent with chronic medical renal disease. Electronically Signed   By: Keith Rake M.D.   On: 02/03/2020 23:07   ECHO TEE  Result Date: 02/04/2020    TRANSESOPHOGEAL ECHO REPORT   Patient Name:   Peter [name] Date of Exam: 02/04/2020 Medical Rec #:  [id number]        Height:       70.0 in Accession #:    [id number]       Weight:       159.4 lb Date of Birth:  1960-08-04       BSA:          1.895 m Patient Age:    17 years         BP:           156/81 mmHg Patient Gender: M                HR:           64 bpm. Exam Location:  Inpatient Procedure:  Transesophageal Echo and Color Doppler Indications:     Bacteremia  History:         Patient has prior history of Echocardiogram examinations, most                  recent 02/02/2020. Risk Factors:Diabetes, Hypertension and                  Dyslipidemia. CKD.  Sonographer:     Clayton Lefort RDCS (AE) Referring Phys:  [id number] [place] Diagnosing Phys: Buford Dresser MD PROCEDURE: After discussion of the risks and benefits of a TEE, an informed consent was obtained from the patient. The transesophogeal probe was passed without difficulty through the esophogus of the patient. Local oropharyngeal anesthetic was provided with viscous lidocaine. Sedation performed by different physician. The patient was monitored while under deep sedation. Anesthestetic sedation was provided intravenously by Anesthesiology: 139.3mg  of Propofol. Image quality was good. The patient's vital signs; including heart rate, blood pressure, and oxygen saturation; remained stable throughout the procedure. The patient developed no complications during the procedure. IMPRESSIONS  1. Left ventricular ejection fraction, by estimation, is 65 to 70%. The left ventricle has normal function. The left ventricle has no regional wall motion abnormalities.  2. Right ventricular systolic function is normal. The right ventricular size is normal.  3. No left atrial/left atrial appendage thrombus was detected.  4. The mitral valve is normal in structure. Trivial mitral valve regurgitation. No evidence of mitral stenosis.  5. The aortic valve is bicuspid. Aortic valve regurgitation is trivial. No aortic stenosis is present.  6. There is mild (Grade II) plaque involving the descending aorta. Conclusion(s)/Recommendation(s): No evidence of endocarditis. Bicuspid aortic valve. Structure seen on TTE is associated with the mitral valve, not the aortic, near the mitral-aortic annular junction. This appears to be a chordae, not consistent with vegetation.  FINDINGS  Left Ventricle: Left ventricular ejection fraction, by estimation, is 65 to 70%. The left ventricle has normal function. The left ventricle has no regional wall motion abnormalities. The left ventricular internal cavity size was normal in size. There is  borderline left ventricular hypertrophy. Right Ventricle: The right ventricular size is normal. No increase in right ventricular wall thickness. Right ventricular systolic function is normal. Left Atrium: Left atrial size was not assessed. No left atrial/left atrial appendage thrombus was detected. Right Atrium: Right atrial size was not  assessed. Pericardium: There is no evidence of pericardial effusion. Mitral Valve: The mitral valve is normal in structure. Trivial mitral valve regurgitation. No evidence of mitral valve stenosis. Tricuspid Valve: The tricuspid valve is normal in structure. Tricuspid valve regurgitation is trivial. No evidence of tricuspid stenosis. Aortic Valve: The aortic valve is bicuspid. Aortic valve regurgitation is trivial. No aortic stenosis is present. Pulmonic Valve: The pulmonic valve was grossly normal. Pulmonic valve regurgitation is trivial. Aorta: The aortic root is normal in size and structure. There is mild (Grade II) plaque involving the descending aorta. IAS/Shunts: No atrial level shunt detected by color flow Doppler. Buford Dresser MD Electronically signed by Buford Dresser MD Signature Date/Time: 02/04/2020/5:29:42 PM    Final      Assessment/Plan: MRSA bacteremia with deep tissue infection to buttock s/p I x d = recommend to continue with daptomycin due to renal insufficiency. Will need tunneled line due to CKD. Continue with hydrotherapy and dressing changes.   - will recommend 4 wk of daptomycin plus transition to oral abtx - will need weekly ck and bmp  Leukocytosis = normalized  Will get appt in 4 wk  Will Sign off  [facility] Cell:  [phone] Pager: [phone]  02/05/2020, 10:50 AM

## 2020-02-05 NOTE — Progress Notes (Signed)
1 Day Post-Op  Subjective: No new complaints today.  Just had dressing change.  ROS: See above, otherwise other systems negative  Objective: Vital signs in last 24 hours: Temp:  [97.8 F (36.6 C)-98.6 F (37 C)] 98 F (36.7 C) (08/18 0428) Pulse Rate:  [52-65] 65 (08/18 0428) Resp:  [16-18] 18 (08/18 0428) BP: (159-193)/(77-86) 159/80 (08/18 0428) SpO2:  [97 %-100 %] 97 % (08/18 0428) Last BM Date: 02/04/20  Intake/Output from previous day: 08/17 0701 - 08/18 0700 In: 1555.7 [P.O.:930; I.V.:625.7] Out: 900 [Urine:900] Intake/Output this shift: No intake/output data recorded.  PE: Skin: wounds are all stable.  There is still a fair amount of necrotic tissue on the edges of the majority of wounds.  See PT's pictures from yesterday.  Still some skin breakdown just inferior to most inferior left wound.    Lab Results:  Recent Labs    02/04/20 0319 02/05/20 0959  WBC 11.1* 10.8*  HGB 7.7* 7.6*  HCT 23.5* 24.1*  PLT 353 348   BMET Recent Labs    02/04/20 1240 02/05/20 0959  NA 142 140  K 4.0 4.3  CL 102 100  CO2 30 32  GLUCOSE 204* 162*  BUN 67* 57*  CREATININE 4.19* 3.82*  CALCIUM 7.6* 7.8*   PT/INR No results for input(s): LABPROT, INR in the last 72 hours. CMP     Component Value Date/Time   NA 140 02/05/2020 0959   K 4.3 02/05/2020 0959   CL 100 02/05/2020 0959   CO2 32 02/05/2020 0959   GLUCOSE 162 (H) 02/05/2020 0959   BUN 57 (H) 02/05/2020 0959   CREATININE 3.82 (H) 02/05/2020 0959   CALCIUM 7.8 (L) 02/05/2020 0959   PROT 5.5 (L) 02/02/2020 0225   ALBUMIN 2.0 (L) 02/03/2020 0402   AST 26 02/02/2020 0225   ALT 29 02/02/2020 0225   ALKPHOS 84 02/02/2020 0225   BILITOT 0.4 02/02/2020 0225   GFRNONAA 16 (L) 02/05/2020 0959   GFRAA 19 (L) 02/05/2020 0959   Lipase  No results found for: LIPASE     Studies/Results: US RENAL  Result Date: 02/03/2020 CLINICAL DATA:  Acute kidney injury. EXAM: RENAL / URINARY TRACT ULTRASOUND COMPLETE  COMPARISON:  CT 02/01/2020, renal ultrasound 01/03/2020 FINDINGS: Right Kidney: Renal measurements: 11.7 x 5.6 x 5.3 cm = volume: 180 mL. Mild increased renal parenchymal echogenicity. No mass or hydronephrosis visualized. Left Kidney: Renal measurements: 10.1 x 5.3 x 5.2 cm = volume: 146 mL. Mild hydronephrosis. Renal pelvis measures 12 mm in dimension. Mild increased renal echogenicity. No evidence of focal lesion. Bladder: Appears normal for degree of bladder distention. Other: Incidental gallstones. IMPRESSION: 1. Mild left hydronephrosis. This is unchanged from prior CT as well as renal ultrasound last month. Cause for obstruction of stab list. 2. Mild increased renal echogenicity consistent with chronic medical renal disease. Electronically Signed   By: Keith Rake M.D.   On: 02/03/2020 23:07   ECHO TEE  Result Date: 02/04/2020    TRANSESOPHOGEAL ECHO REPORT   Patient Name:   Peter [name] Date of Exam: 02/04/2020 Medical Rec #:  [id number]        Height:       70.0 in Accession #:    [id number]       Weight:       159.4 lb Date of Birth:  [birth year]       BSA:          1.895 m Patient Age:  59 years         BP:           156/81 mmHg Patient Gender: M                HR:           64 bpm. Exam Location:  Inpatient Procedure: Transesophageal Echo and Color Doppler Indications:     Bacteremia  History:         Patient has prior history of Echocardiogram examinations, most                  recent 02/02/2020. Risk Factors:Diabetes, Hypertension and                  Dyslipidemia. CKD.  Sonographer:     Clayton Lefort RDCS (AE) Referring Phys:  [id number] [place] Diagnosing Phys: Buford Dresser MD PROCEDURE: After discussion of the risks and benefits of a TEE, an informed consent was obtained from the patient. The transesophogeal probe was passed without difficulty through the esophogus of the patient. Local oropharyngeal anesthetic was provided with viscous lidocaine. Sedation performed by  different physician. The patient was monitored while under deep sedation. Anesthestetic sedation was provided intravenously by Anesthesiology: 139.3mg  of Propofol. Image quality was good. The patient's vital signs; including heart rate, blood pressure, and oxygen saturation; remained stable throughout the procedure. The patient developed no complications during the procedure. IMPRESSIONS  1. Left ventricular ejection fraction, by estimation, is 65 to 70%. The left ventricle has normal function. The left ventricle has no regional wall motion abnormalities.  2. Right ventricular systolic function is normal. The right ventricular size is normal.  3. No left atrial/left atrial appendage thrombus was detected.  4. The mitral valve is normal in structure. Trivial mitral valve regurgitation. No evidence of mitral stenosis.  5. The aortic valve is bicuspid. Aortic valve regurgitation is trivial. No aortic stenosis is present.  6. There is mild (Grade II) plaque involving the descending aorta. Conclusion(s)/Recommendation(s): No evidence of endocarditis. Bicuspid aortic valve. Structure seen on TTE is associated with the mitral valve, not the aortic, near the mitral-aortic annular junction. This appears to be a chordae, not consistent with vegetation. FINDINGS  Left Ventricle: Left ventricular ejection fraction, by estimation, is 65 to 70%. The left ventricle has normal function. The left ventricle has no regional wall motion abnormalities. The left ventricular internal cavity size was normal in size. There is  borderline left ventricular hypertrophy. Right Ventricle: The right ventricular size is normal. No increase in right ventricular wall thickness. Right ventricular systolic function is normal. Left Atrium: Left atrial size was not assessed. No left atrial/left atrial appendage thrombus was detected. Right Atrium: Right atrial size was not assessed. Pericardium: There is no evidence of pericardial effusion. Mitral  Valve: The mitral valve is normal in structure. Trivial mitral valve regurgitation. No evidence of mitral valve stenosis. Tricuspid Valve: The tricuspid valve is normal in structure. Tricuspid valve regurgitation is trivial. No evidence of tricuspid stenosis. Aortic Valve: The aortic valve is bicuspid. Aortic valve regurgitation is trivial. No aortic stenosis is present. Pulmonic Valve: The pulmonic valve was grossly normal. Pulmonic valve regurgitation is trivial. Aorta: The aortic root is normal in size and structure. There is mild (Grade II) plaque involving the descending aorta. IAS/Shunts: No atrial level shunt detected by color flow Doppler. Buford Dresser MD Electronically signed by Buford Dresser MD Signature Date/Time: 02/04/2020/5:29:42 PM    Final     Anti-infectives:  Anti-infectives (From admission, onward)   Start     Dose/Rate Route Frequency Ordered Stop   02/04/20 2000  DAPTOmycin (CUBICIN) 600 mg in sodium chloride 0.9 % IVPB     Discontinue     600 mg 224 mL/hr over 30 Minutes Intravenous Every 48 hours 02/03/20 1426     02/02/20 1430  DAPTOmycin (CUBICIN) 500 mg in sodium chloride 0.9 % IVPB  Status:  Discontinued        500 mg 220 mL/hr over 30 Minutes Intravenous Every 48 hours 02/02/20 1332 02/03/20 1426   02/01/20 0800  meropenem (MERREM) 500 mg in sodium chloride 0.9 % 100 mL IVPB  Status:  Discontinued        500 mg 200 mL/hr over 30 Minutes Intravenous Every 12 hours 02/01/20 0656 02/02/20 1304   02/01/20 0655  vancomycin variable dose per unstable renal function (pharmacist dosing)  Status:  Discontinued         Does not apply See admin instructions 02/01/20 0656 02/02/20 1305   02/01/20 0300  ceFEPIme (MAXIPIME) 2 g in sodium chloride 0.9 % 100 mL IVPB        2 g 200 mL/hr over 30 Minutes Intravenous  Once 02/01/20 0252 02/01/20 0446   02/01/20 0300  vancomycin (VANCOREADY) IVPB 1500 mg/300 mL        1,500 mg 150 mL/hr over 120 Minutes Intravenous  Once  02/01/20 0252 02/01/20 0624       Assessment/Plan DM CKD/AKI- Creatinine -3.82 Anemia-Hgb - 8.4 - 02/02/2020 HTN MRSA bacteremia  POD 4, s/pIRRIGATION AND DEBRIDEMENT ABSCESSof multiple buttocks abscesses - 8/14 - [facility] -cxs reveal MRSA.  abx per ID -WBC down to 11K - PT hydrotherapy to help with necrotic tissue -otherwise cont TID dressing changes for now -cont to follow, not ready for DC yet  FEN -carb mod VTE -heparin ID -cubicin   LOS: 4 days    Henreitta Cea , [facility] 02/05/2020, 11:18 AM Please see Amion for pager number during day hours 7:00am-4:30pm or 7:00am -11:30am on weekends

## 2020-02-06 ENCOUNTER — Encounter (HOSPITAL_COMMUNITY): Payer: Self-pay | Admitting: Cardiology

## 2020-02-06 LAB — BASIC METABOLIC PANEL
Anion gap: 10 (ref 5–15)
BUN: 58 mg/dL — ABNORMAL HIGH (ref 6–20)
CO2: 29 mmol/L (ref 22–32)
Calcium: 7.9 mg/dL — ABNORMAL LOW (ref 8.9–10.3)
Chloride: 100 mmol/L (ref 98–111)
Creatinine, Ser: 3.84 mg/dL — ABNORMAL HIGH (ref 0.61–1.24)
GFR calc Af Amer: 19 mL/min — ABNORMAL LOW (ref >60)
GFR calc non Af Amer: 16 mL/min — ABNORMAL LOW (ref >60)
Glucose, Bld: 103 mg/dL — ABNORMAL HIGH (ref 70–99)
Potassium: 4.4 mmol/L (ref 3.5–5.1)
Sodium: 139 mmol/L (ref 135–145)

## 2020-02-06 LAB — CULTURE, BLOOD (ROUTINE X 2)
Culture: NO GROWTH
Special Requests: ADEQUATE

## 2020-02-06 LAB — CBC WITH DIFFERENTIAL/PLATELET
Abs Immature Granulocytes: 0.26 10*3/uL — ABNORMAL HIGH (ref 0.00–0.07)
Basophils Absolute: 0 10*3/uL (ref 0.0–0.1)
Basophils Relative: 0 %
Eosinophils Absolute: 0.3 10*3/uL (ref 0.0–0.5)
Eosinophils Relative: 2 %
HCT: 24 % — ABNORMAL LOW (ref 39.0–52.0)
Hemoglobin: 7.6 g/dL — ABNORMAL LOW (ref 13.0–17.0)
Immature Granulocytes: 2 %
Lymphocytes Relative: 19 %
Lymphs Abs: 2.1 10*3/uL (ref 0.7–4.0)
MCH: 29.6 pg (ref 26.0–34.0)
MCHC: 31.7 g/dL (ref 30.0–36.0)
MCV: 93.4 fL (ref 80.0–100.0)
Monocytes Absolute: 0.8 10*3/uL (ref 0.1–1.0)
Monocytes Relative: 7 %
Neutro Abs: 7.5 10*3/uL (ref 1.7–7.7)
Neutrophils Relative %: 70 %
Platelets: 360 10*3/uL (ref 150–400)
RBC: 2.57 MIL/uL — ABNORMAL LOW (ref 4.22–5.81)
RDW: 12.2 % (ref 11.5–15.5)
WBC: 11 10*3/uL — ABNORMAL HIGH (ref 4.0–10.5)
nRBC: 0 % (ref 0.0–0.2)

## 2020-02-06 LAB — AEROBIC/ANAEROBIC CULTURE W GRAM STAIN (SURGICAL/DEEP WOUND)

## 2020-02-06 LAB — GLUCOSE, CAPILLARY
Glucose-Capillary: 87 mg/dL (ref 70–99)
Glucose-Capillary: 191 mg/dL — ABNORMAL HIGH (ref 70–99)
Glucose-Capillary: 183 mg/dL — ABNORMAL HIGH (ref 70–99)
Glucose-Capillary: 161 mg/dL — ABNORMAL HIGH (ref 70–99)

## 2020-02-06 MED ORDER — HYDRALAZINE HCL 20 MG/ML IJ SOLN
10.0000 mg | Freq: Once | INTRAMUSCULAR | Status: AC
  Administered 2020-02-06: 10 mg via INTRAVENOUS
  Filled 2020-02-06: qty 1

## 2020-02-06 MED ORDER — AMLODIPINE BESYLATE 5 MG PO TABS
5.0000 mg | ORAL_TABLET | Freq: Every day | ORAL | Status: DC
  Administered 2020-02-06: 5 mg via ORAL
  Filled 2020-02-06: qty 1

## 2020-02-06 NOTE — Progress Notes (Signed)
[facility]  PROGRESS NOTE  Peter [name] ZOX:[id number] DOB: May 13, 1961 DOA: 01/31/2020 PCP: Raelene Bott, MD Admit date - 01/31/2020   Admitting Physician Vernelle Emerald, MD  Outpatient Primary MD for the patient is Raelene Bott, MD  LOS - 5 Brief Narrative  Peter [name] is a 59 y.o. year old male with medical history significant for uncontrolled type 2 diabetes, diabetic nephropathy, HTN, HLD, CKD stage IV who presented on 8/13/21subjective chills, generalized weakness, nausea and vomiting in multiple draining ulcers of the buttocks for the past week and was found to have leukocytosis, AKI with creatinine of 6, and CT imaging consistent with gluteal fluid collections concerning for abscesses that required debridement by Dr. Lucia Gaskins with surgery on 8/14.  Patient was found to have MRSA bacteremia as well as staph aureus growing from cultures from the abscess.  Empiric vancomycin and meropenem were changed to daptomycin due to acute on chronic kidney disease and culture data with ID consultants assistance.  Patient underwent TTE and TEE which were negative for any signs of endocarditis.  Subjective  Today has no acute complaints.  Tolerating hydrotherapy with PT A & P   Deep tissue infection of buttocks with multiple abscesses complicated by MRSA bacteremia, stable.  Status post I&D on 8/14 by Dr. Lucia Gaskins.  Remains afebrile, white count stable at 11,000.  Patient still requiring daily hydrotherapy to help with necrotic tissue -Continue daptomycin x4 weeks per ID, outpatient follow-up arranged -Daily PT hydrotherapy for necrotic tissue management, decrease to twice daily daily dressing changes, Santyl to wound edges and bases,  not ready for discharge yet per surgery -As needed pain control with oxycodone  AKI on CKD stage IV, resolved.  Peak creatinine of 6 on admission likely related to infectious presentation.  Creatinine currently 3.8, previous baseline of 4.4 (per care  everywhere).  Still maintaining adequate output, no acute indication for dialysis.  Renal ultrasound consistent with chronic medical renal disease, also mild left hydronephrosis unchanged from prior CT last month -Outpatient nephrology referral placed -Avoid nephrotoxins, monitor BMP and output   Normocytic anemia, slightly worsening on admission hemoglobin 8.8-9.2.  Currently 7.6 with no signs or symptoms of bleeding -Monitor CBC  Type 2 diabetes, A1c 7.4 -Holding home loperamide -Discontinued Lantus started here for relative hypoglycemia (80, 8/18), CBG currently at goal  -Continue sliding scale as needed, monitor CBGs  HTN, blood pressure elevated with SBP's in the 160s -Start amlodipine 5 mg, monitor -Continue Lopressor 50 mg twice daily  Family Communication  : None  Code Status : Full  Disposition Plan  :  Patient is from home. Anticipated d/c date: 2 to 3 days. Barriers to d/c or necessity for inpatient status: No longer requiring intensive wound care with daily PT hydrotherapy, need to make sure no recurrent need for surgical intervention  Consults  : Surgery, ID  Procedures  : I&D on 8/14 by Dr. Lucia Gaskins  DVT Prophylaxis  : Heparin Lab Results  Component Value Date   PLT 360 02/06/2020    Diet :  Diet Order            Diet heart healthy/carb modified Room service appropriate? Yes; Fluid consistency: Thin  Diet effective now                  Inpatient Medications Scheduled Meds: . amLODipine  5 mg Oral Daily  . collagenase   Topical TID  . heparin injection (subcutaneous)  5,000 Units Subcutaneous Q8H  . insulin aspart  0-15 Units Subcutaneous TID AC & HS  . lidocaine  20 mL Intradermal Once  . metoprolol tartrate  50 mg Oral BID   Continuous Infusions: . DAPTOmycin (CUBICIN)  IV 600 mg (02/04/20 2024)   PRN Meds:.acetaminophen **OR** acetaminophen, ondansetron **OR** ondansetron (ZOFRAN) IV, oxyCODONE-acetaminophen **OR** oxyCODONE-acetaminophen,  polyethylene glycol, silver nitrate applicators  Antibiotics  :   Anti-infectives (From admission, onward)   Start     Dose/Rate Route Frequency Ordered Stop   02/04/20 2000  DAPTOmycin (CUBICIN) 600 mg in sodium chloride 0.9 % IVPB        600 mg 224 mL/hr over 30 Minutes Intravenous Every 48 hours 02/03/20 1426     02/02/20 1430  DAPTOmycin (CUBICIN) 500 mg in sodium chloride 0.9 % IVPB  Status:  Discontinued        500 mg 220 mL/hr over 30 Minutes Intravenous Every 48 hours 02/02/20 1332 02/03/20 1426   02/01/20 0800  meropenem (MERREM) 500 mg in sodium chloride 0.9 % 100 mL IVPB  Status:  Discontinued        500 mg 200 mL/hr over 30 Minutes Intravenous Every 12 hours 02/01/20 0656 02/02/20 1304   02/01/20 0655  vancomycin variable dose per unstable renal function (pharmacist dosing)  Status:  Discontinued         Does not apply See admin instructions 02/01/20 0656 02/02/20 1305   02/01/20 0300  ceFEPIme (MAXIPIME) 2 g in sodium chloride 0.9 % 100 mL IVPB        2 g 200 mL/hr over 30 Minutes Intravenous  Once 02/01/20 0252 02/01/20 0446   02/01/20 0300  vancomycin (VANCOREADY) IVPB 1500 mg/300 mL        1,500 mg 150 mL/hr over 120 Minutes Intravenous  Once 02/01/20 0252 02/01/20 0624       Objective   Vitals:   02/05/20 2217 02/06/20 0504 02/06/20 0626 02/06/20 1002  BP: (!) 175/81 (!) 169/85 (!) 152/74 (!) 151/77  Pulse: 62 72 79 85  Resp: '16 17 20 16  ' Temp: 98.3 F (36.8 C) 98.3 F (36.8 C) 98.2 F (36.8 C)   TempSrc: Oral     SpO2: 98% 98% 97% 99%  Weight:      Height:        SpO2: 99 % O2 Flow Rate (L/min): 2 L/min  Wt Readings from Last 3 Encounters:  02/04/20 72.3 kg  11/20/19 75.3 kg  05/10/17 83.9 kg     Intake/Output Summary (Last 24 hours) at 02/06/2020 1218 Last data filed at 02/06/2020 0902 Gross per 24 hour  Intake 808.62 ml  Output 2000 ml  Net -1191.38 ml    Physical Exam:     Awake Alert, Oriented X 3, Normal affect No new F.N  deficits,  [place].AT, Normal respiratory effort on room air, CTAB RRR,No Gallops,Rubs or new Murmurs,  +ve B.Sounds, Abd Soft, No tenderness, No rebound, guarding or rigidity. Multiple wounds around perirectal area with some necrotic tissue around the edges as well as induration, no active drainage   I have personally reviewed the following:   Data Reviewed:  CBC Recent Labs  Lab 01/31/20 1608 01/31/20 1608 02/01/20 0831 02/02/20 0225 02/04/20 0319 02/05/20 0959 02/06/20 0354  WBC 22.6*   < > 30.5* 21.4* 11.1* 10.8* 11.0*  HGB 9.2*   < > 8.8* 8.4* 7.7* 7.6* 7.6*  HCT 29.7*   < > 28.3* 25.8* 23.5* 24.1* 24.0*  PLT 314   < > 328 340 353 348 360  MCV  94.9   < > 94.0 90.8 90.4 94.1 93.4  MCH 29.4   < > 29.2 29.6 29.6 29.7 29.6  MCHC 31.0   < > 31.1 32.6 32.8 31.5 31.7  RDW 12.8   < > 12.9 12.9 12.6 12.4 12.2  LYMPHSABS 0.7  --  0.4* 0.4*  --  1.8 2.1  MONOABS 2.8*  --  3.0* 1.8*  --  0.8 0.8  EOSABS 0.2  --  0.0 0.2  --  0.2 0.3  BASOSABS 0.1  --  0.1 0.0  --  0.1 0.0   < > = values in this interval not displayed.    Chemistries  Recent Labs  Lab 01/31/20 1608 01/31/20 1608 02/01/20 0831 02/01/20 0831 02/02/20 0225 02/03/20 0402 02/04/20 1240 02/05/20 0959 02/06/20 0354  NA 132*   < > 132*   < > 136 139 142 140 139  K 4.7   < > 5.6*   < > 4.5 4.0 4.0 4.3 4.4  CL 104   < > 103   < > 106 106 102 100 100  CO2 13*   < > 12*   < > 17* 21* 30 32 29  GLUCOSE 224*   < > 259*   < > 156* 180* 204* 162* 103*  BUN 80*   < > 99*   < > 96* 80* 67* 57* 58*  CREATININE 6.00*   < > 6.93*   < > 6.28* 5.24* 4.19* 3.82* 3.84*  CALCIUM 8.2*   < > 8.0*   < > 7.9* 7.7* 7.6* 7.8* 7.9*  MG  --   --   --   --  1.5*  --   --   --   --   AST 27  --  29  --  26  --   --   --   --   ALT 29  --  28  --  29  --   --   --   --   ALKPHOS 90  --  87  --  84  --   --   --   --   BILITOT 0.7  --  0.8  --  0.4  --   --   --   --    < > = values in this interval not displayed.    ------------------------------------------------------------------------------------------------------------------ No results for input(s): CHOL, HDL, LDLCALC, TRIG, CHOLHDL, LDLDIRECT in the last 72 hours.  Lab Results  Component Value Date   HGBA1C 7.4 (H) 02/01/2020   ------------------------------------------------------------------------------------------------------------------ No results for input(s): TSH, T4TOTAL, T3FREE, THYROIDAB in the last 72 hours.  Invalid input(s): FREET3 ------------------------------------------------------------------------------------------------------------------ No results for input(s): VITAMINB12, FOLATE, FERRITIN, TIBC, IRON, RETICCTPCT in the last 72 hours.  Coagulation profile Recent Labs  Lab 02/01/20 0304  INR 1.3*    No results for input(s): DDIMER in the last 72 hours.  Cardiac Enzymes No results for input(s): CKMB, TROPONINI, MYOGLOBIN in the last 168 hours.  Invalid input(s): CK ------------------------------------------------------------------------------------------------------------------ No results found for: BNP  Micro Results Recent Results (from the past 240 hour(s))  Blood Culture (routine x 2)     Status: None   Collection Time: 02/01/20  3:04 AM   Specimen: BLOOD RIGHT FOREARM  Result Value Ref Range Status   Specimen Description BLOOD RIGHT FOREARM  Final   Special Requests   Final    BOTTLES DRAWN AEROBIC AND ANAEROBIC Blood Culture adequate volume   Culture   Final    NO  GROWTH 5 DAYS Performed at [facility], [place] [address], [place], [place] [address]    Report Status 02/06/2020 FINAL  Final  SARS Coronavirus 2 by RT PCR (hospital order, performed in [place] hospital lab)     Status: None   Collection Time: 02/01/20  3:04 AM  Result Value Ref Range Status   SARS Coronavirus 2 NEGATIVE NEGATIVE Final    Comment: (NOTE) SARS-CoV-2 target nucleic acids are NOT DETECTED.  The SARS-CoV-2 RNA is  generally detectable in upper and lower respiratory specimens during the acute phase of infection. The lowest concentration of SARS-CoV-2 viral copies this assay can detect is 250 copies / mL. A negative result does not preclude SARS-CoV-2 infection and should not be used as the sole basis for treatment or other patient management decisions.  A negative result may occur with improper specimen collection / handling, submission of specimen other than nasopharyngeal swab, presence of viral mutation(s) within the areas targeted by this assay, and inadequate number of viral copies (<250 copies / mL). A negative result must be combined with clinical observations, patient history, and epidemiological information.  Fact Sheet for Patients:   StrictlyIdeas.no  Fact Sheet for Healthcare Providers: BankingDealers.co.za  This test is not yet approved or  cleared by the [place] FDA and has been authorized for detection and/or diagnosis of SARS-CoV-2 by FDA under an Emergency Use Authorization (EUA).  This EUA will remain in effect (meaning this test can be used) for the duration of the COVID-19 declaration under Section 564(b)(1) of the Act, 21 U.S.C. section 360bbb-3(b)(1), unless the authorization is terminated or revoked sooner.  Performed at [facility], [place] [address], [place], [place] [address]   Wound or Superficial Culture     Status: None   Collection Time: 02/01/20  4:13 AM   Specimen: Buttocks; Wound  Result Value Ref Range Status   Specimen Description BUTTOCKS  Final   Special Requests NONE  Final   Gram Stain   Final    NO WBC SEEN ABUNDANT GRAM POSITIVE COCCI RARE GRAM NEGATIVE RODS RARE GRAM POSITIVE RODS Performed at [facility], [address] [place], [place] [address]    Culture   Final    ABUNDANT METHICILLIN RESISTANT STAPHYLOCOCCUS AUREUS   Report Status 02/03/2020 FINAL  Final   Organism ID,  Bacteria METHICILLIN RESISTANT STAPHYLOCOCCUS AUREUS  Final      Susceptibility   Methicillin resistant staphylococcus aureus - MIC*    CIPROFLOXACIN >=8 RESISTANT Resistant     ERYTHROMYCIN >=8 RESISTANT Resistant     GENTAMICIN <=0.5 SENSITIVE Sensitive     OXACILLIN >=4 RESISTANT Resistant     TETRACYCLINE <=1 SENSITIVE Sensitive     VANCOMYCIN 1 SENSITIVE Sensitive     TRIMETH/SULFA <=10 SENSITIVE Sensitive     CLINDAMYCIN <=0.25 SENSITIVE Sensitive     RIFAMPIN <=0.5 SENSITIVE Sensitive     Inducible Clindamycin NEGATIVE Sensitive     * ABUNDANT METHICILLIN RESISTANT STAPHYLOCOCCUS AUREUS  Blood Culture (routine x 2)     Status: Abnormal   Collection Time: 02/01/20  4:48 AM   Specimen: BLOOD  Result Value Ref Range Status   Specimen Description BLOOD RIGHT ANTECUBITAL  Final   Special Requests   Final    BOTTLES DRAWN AEROBIC AND ANAEROBIC Blood Culture adequate volume   Culture  Setup Time   Final    GRAM POSITIVE COCCI IN CLUSTERS IN BOTH AEROBIC AND ANAEROBIC BOTTLES CRITICAL RESULT CALLED TO,  READ BACK BY AND VERIFIED WITH: J. LEDFORD,PHARMD 0018 02/02/2020 Mena Goes Performed at [facility], [place] [address], [place], [place] [address]    Culture METHICILLIN RESISTANT STAPHYLOCOCCUS AUREUS (A)  Final   Report Status 02/03/2020 FINAL  Final   Organism ID, Bacteria METHICILLIN RESISTANT STAPHYLOCOCCUS AUREUS  Final      Susceptibility   Methicillin resistant staphylococcus aureus - MIC*    CIPROFLOXACIN >=8 RESISTANT Resistant     ERYTHROMYCIN >=8 RESISTANT Resistant     GENTAMICIN <=0.5 SENSITIVE Sensitive     OXACILLIN >=4 RESISTANT Resistant     TETRACYCLINE <=1 SENSITIVE Sensitive     VANCOMYCIN 1 SENSITIVE Sensitive     TRIMETH/SULFA <=10 SENSITIVE Sensitive     CLINDAMYCIN <=0.25 SENSITIVE Sensitive     RIFAMPIN <=0.5 SENSITIVE Sensitive     Inducible Clindamycin NEGATIVE Sensitive     * METHICILLIN RESISTANT STAPHYLOCOCCUS AUREUS  Blood Culture ID Panel  (Reflexed)     Status: Abnormal   Collection Time: 02/01/20  4:48 AM  Result Value Ref Range Status   Enterococcus faecalis NOT DETECTED NOT DETECTED Final   Enterococcus Faecium NOT DETECTED NOT DETECTED Final   Listeria monocytogenes NOT DETECTED NOT DETECTED Final   Staphylococcus species DETECTED (A) NOT DETECTED Final    Comment: CRITICAL RESULT CALLED TO, READ BACK BY AND VERIFIED WITH: J. LEDFORD,PHARMD 0018 02/02/2020 T. TYSOR    Staphylococcus aureus (BCID) DETECTED (A) NOT DETECTED Final    Comment: Methicillin (oxacillin)-resistant Staphylococcus aureus (MRSA). MRSA is predictably resistant to beta-lactam antibiotics (except ceftaroline). Preferred therapy is vancomycin unless clinically contraindicated. Patient requires contact precautions if  hospitalized. CRITICAL RESULT CALLED TO, READ BACK BY AND VERIFIED WITH: J. LEDFORD,PHARMD 0018 02/02/2020 T. TYSOR    Staphylococcus epidermidis NOT DETECTED NOT DETECTED Final   Staphylococcus lugdunensis NOT DETECTED NOT DETECTED Final   Streptococcus species NOT DETECTED NOT DETECTED Final   Streptococcus agalactiae NOT DETECTED NOT DETECTED Final   Streptococcus pneumoniae NOT DETECTED NOT DETECTED Final   Streptococcus pyogenes NOT DETECTED NOT DETECTED Final   A.calcoaceticus-baumannii NOT DETECTED NOT DETECTED Final   Bacteroides fragilis NOT DETECTED NOT DETECTED Final   Enterobacterales NOT DETECTED NOT DETECTED Final   Enterobacter cloacae complex NOT DETECTED NOT DETECTED Final   Escherichia coli NOT DETECTED NOT DETECTED Final   Klebsiella aerogenes NOT DETECTED NOT DETECTED Final   Klebsiella oxytoca NOT DETECTED NOT DETECTED Final   Klebsiella pneumoniae NOT DETECTED NOT DETECTED Final   Proteus species NOT DETECTED NOT DETECTED Final   Salmonella species NOT DETECTED NOT DETECTED Final   Serratia marcescens NOT DETECTED NOT DETECTED Final   Haemophilus influenzae NOT DETECTED NOT DETECTED Final   Neisseria  meningitidis NOT DETECTED NOT DETECTED Final   Pseudomonas aeruginosa NOT DETECTED NOT DETECTED Final   Stenotrophomonas maltophilia NOT DETECTED NOT DETECTED Final   Candida albicans NOT DETECTED NOT DETECTED Final   Candida auris NOT DETECTED NOT DETECTED Final   Candida glabrata NOT DETECTED NOT DETECTED Final   Candida krusei NOT DETECTED NOT DETECTED Final   Candida parapsilosis NOT DETECTED NOT DETECTED Final   Candida tropicalis NOT DETECTED NOT DETECTED Final   Cryptococcus neoformans/gattii NOT DETECTED NOT DETECTED Final   Meth resistant mecA/C and MREJ DETECTED (A) NOT DETECTED Final    Comment: CRITICAL RESULT CALLED TO, READ BACK BY AND VERIFIED WITH: J. LEDFORD,PHARMD 0018 02/02/2020 T. TYSOR Performed at [facility], [address], [place], [place] [address]   Aerobic/Anaerobic  Culture (surgical/deep wound)     Status: None (Preliminary result)   Collection Time: 02/01/20 11:31 AM   Specimen: Buttocks; Abscess  Result Value Ref Range Status   Specimen Description ABSCESS  Final   Special Requests BUTTOCKS  Final   Gram Stain   Final    RARE WBC PRESENT, PREDOMINANTLY PMN FEW GRAM POSITIVE COCCI Performed at [facility], [place] [address], [place], [place] [address]    Culture   Final    MODERATE METHICILLIN RESISTANT STAPHYLOCOCCUS AUREUS NO ANAEROBES ISOLATED; CULTURE IN PROGRESS FOR 5 DAYS    Report Status PENDING  Incomplete   Organism ID, Bacteria METHICILLIN RESISTANT STAPHYLOCOCCUS AUREUS  Final      Susceptibility   Methicillin resistant staphylococcus aureus - MIC*    CIPROFLOXACIN >=8 RESISTANT Resistant     ERYTHROMYCIN >=8 RESISTANT Resistant     GENTAMICIN <=0.5 SENSITIVE Sensitive     OXACILLIN >=4 RESISTANT Resistant     TETRACYCLINE <=1 SENSITIVE Sensitive     VANCOMYCIN 1 SENSITIVE Sensitive     TRIMETH/SULFA <=10 SENSITIVE Sensitive     CLINDAMYCIN <=0.25 SENSITIVE Sensitive     RIFAMPIN <=0.5 SENSITIVE Sensitive     Inducible  Clindamycin NEGATIVE Sensitive     * MODERATE METHICILLIN RESISTANT STAPHYLOCOCCUS AUREUS  Urine culture     Status: Abnormal (Preliminary result)   Collection Time: 02/02/20  5:05 AM   Specimen: In/Out Cath Urine  Result Value Ref Range Status   Specimen Description IN/OUT CATH URINE  Final   Special Requests NONE  Final   Culture (A)  Final    400 COLONIES/mL GRAM NEGATIVE RODS SUBBING FOR SEND OUT Performed at [facility], [place] [address], [place], [place] [address]    Report Status PENDING  Incomplete  MRSA PCR Screening     Status: None   Collection Time: 02/02/20  5:16 AM   Specimen: Nasal Mucosa; Nasopharyngeal  Result Value Ref Range Status   MRSA by PCR NEGATIVE NEGATIVE Final    Comment:        The GeneXpert MRSA Assay (FDA approved for NASAL specimens only), is one component of a comprehensive MRSA colonization surveillance program. It is not intended to diagnose MRSA infection nor to guide or monitor treatment for MRSA infections. Performed at [facility], [place] [address], [place], [place] [address]   Culture, blood (routine x 2)     Status: None (Preliminary result)   Collection Time: 02/03/20  4:02 AM   Specimen: BLOOD  Result Value Ref Range Status   Specimen Description BLOOD SITE NOT SPECIFIED  Final   Special Requests   Final    BOTTLES DRAWN AEROBIC ONLY Blood Culture results may not be optimal due to an inadequate volume of blood received in culture bottles   Culture   Final    NO GROWTH 3 DAYS Performed at [facility], [place] [address] [place], [place] [address]    Report Status PENDING  Incomplete  Culture, blood (routine x 2)     Status: None (Preliminary result)   Collection Time: 02/03/20  4:13 AM   Specimen: BLOOD  Result Value Ref Range Status   Specimen Description BLOOD SITE NOT SPECIFIED  Final   Special Requests   Final    BOTTLES DRAWN AEROBIC ONLY Blood Culture results may not be optimal due to an inadequate volume of  blood received in culture bottles   Culture   Final  NO GROWTH 3 DAYS Performed at [facility], [place] [address] [place], [place] [address]    Report Status PENDING  Incomplete    Radiology Reports CT ABDOMEN PELVIS WO CONTRAST  Result Date: 02/01/2020 CLINICAL DATA:  Abdominal abscess/infection suspected Nausea. Patient reports decreased appetite. Patient reports buttock abscess. EXAM: CT ABDOMEN AND PELVIS WITHOUT CONTRAST TECHNIQUE: Multidetector CT imaging of the abdomen and pelvis was performed following the standard protocol without IV contrast. COMPARISON:  CT 03/06/2017 FINDINGS: Lower chest: Minimal subpleural scarring in the left lower lobe. No acute airspace disease. No pleural effusion. Hepatobiliary: No focal hepatic abnormality on noncontrast exam. Multiple calcified gallstones without pericholecystic inflammation. There is no biliary dilatation. Pancreas: No ductal dilatation or inflammation. Spleen: Normal in size. No focal abnormality on noncontrast exam. Coils again seen in the splenic artery. Adrenals/Urinary Tract: Previous left adrenal hematoma has resolved, there is mild residual adrenal thickening. Normal right adrenal gland. Mild left hydronephrosis and hydroureter to the level of the distal pelvis. No stone or cause for obstruction. Mild prominence of the right ureter without hydronephrosis. There is symmetric bilateral perinephric edema. Urinary bladder is minimally distended. Stomach/Bowel: Small hiatal hernia. Stomach otherwise unremarkable. There is no bowel obstruction. No evidence of bowel wall thickening or inflammation. Few lower pelvic small bowel loops are fluid-filled but nondilated or inflamed. Normal appendix. High-riding cecum. Moderate volume of colonic stool. There is colonic tortuosity. No colonic wall thickening or inflammation. Vascular/Lymphatic: Abdominal aorta is normal in caliber. Coiling of the splenic artery. Multiple small retroperitoneal lymph  nodes, not enlarged by size criteria and likely reactive. Small bilateral inguinal nodes. Reproductive: Prostate unremarkable. Right testis may be within the inguinal canal, series 3, image 86. Other: No ascites or free air. Subcutaneous fluid collection involving the left gluteal soft tissues about the gluteal crease measuring 2.8 x 1.4 x 4.1 cm. Mild adjacent soft tissue edema tracks into the gluteal fold. Fluid collection in the inferior right gluteal soft tissues abutting the gluteal crease measures 3.0 x 2.4 x 3.8 cm. There is no air within either fluid collection or subcutaneous soft tissues. No extension into the anorectum. Musculoskeletal: Bilateral sacroiliac joint pinning and pubic symphyseal fixation. Remote left transverse process fractures in the lumbar spine. Remote left rib fractures. No acute osseous abnormalities. IMPRESSION: 1. Two gluteal fluid collections, 1 on the right and 1 on the left. Subcutaneous fluid collection involving the left gluteal soft tissues abut the gluteal crease measuring 2.8 x 1.4 x 4.1 cm, suspicious for abscess. Fluid collection in the inferior right gluteal soft tissues abutting the gluteal crease measures 3.0 x 2.4 x 3.8 cm. No air within either fluid collection or subcutaneous soft tissues. 2. Mild left hydronephrosis and hydroureter to the level of the distal pelvis. No stone or cause for obstruction. Bilateral perinephric edema, nonspecific. Decompressed urinary bladder which appears thick walled. 3. Cholelithiasis without gallbladder inflammation. 4. Small hiatal hernia. 5. Right testis may be within the inguinal canal. Recommend correlation with physical exam. Electronically Signed   By: Keith Rake M.D.   On: 02/01/2020 03:39   US RENAL  Result Date: 02/03/2020 CLINICAL DATA:  Acute kidney injury. EXAM: RENAL / URINARY TRACT ULTRASOUND COMPLETE COMPARISON:  CT 02/01/2020, renal ultrasound 01/03/2020 FINDINGS: Right Kidney: Renal measurements: 11.7 x 5.6 x  5.3 cm = volume: 180 mL. Mild increased renal parenchymal echogenicity. No mass or hydronephrosis visualized. Left Kidney: Renal measurements: 10.1 x 5.3 x 5.2 cm = volume: 146 mL. Mild hydronephrosis. Renal pelvis  measures 12 mm in dimension. Mild increased renal echogenicity. No evidence of focal lesion. Bladder: Appears normal for degree of bladder distention. Other: Incidental gallstones. IMPRESSION: 1. Mild left hydronephrosis. This is unchanged from prior CT as well as renal ultrasound last month. Cause for obstruction of stab list. 2. Mild increased renal echogenicity consistent with chronic medical renal disease. Electronically Signed   By: Keith Rake M.D.   On: 02/03/2020 23:07   IR Fluoro Guide CV Line Right  Result Date: 02/05/2020 INDICATION: MRSA bacteremia, cellulitis, access for long-term antibiotics EXAM: TUNNELED PICC LINE WITH ULTRASOUND AND FLUOROSCOPIC GUIDANCE MEDICATIONS: 1% lidocaine local. ANESTHESIA/SEDATION: Moderate Sedation Time:  None. The patient was continuously monitored during the procedure by the interventional radiology nurse under my direct supervision. FLUOROSCOPY TIME:  Fluoroscopy Time: 0 minutes 6 seconds (1 mGy). COMPLICATIONS: None immediate. PROCEDURE: Informed written consent was obtained from the patient after a discussion of the risks, benefits, and alternatives to treatment. Questions regarding the procedure were encouraged and answered. The right neck and chest were prepped with chlorhexidine in a sterile fashion, and a sterile drape was applied covering the operative field. Maximum barrier sterile technique with sterile gowns and gloves were used for the procedure. A timeout was performed prior to the initiation of the procedure. After creating a small venotomy incision, a micropuncture kit was utilized to access the right internal jugular vein under direct, real-time ultrasound guidance after the overlying soft tissues were anesthetized with 1% lidocaine  with epinephrine. Ultrasound image documentation was performed. The microwire was kinked to measure appropriate catheter length. The micropuncture sheath was exchanged for a peel-away sheath over a guidewire. A 5 French dual lumen tunneled PICC measuring 25 cm was tunneled in a retrograde fashion from the anterior chest wall to the venotomy incision. The catheter was then placed through the peel-away sheath with tip ultimately positioned at the superior caval-atrial junction. Final catheter positioning was confirmed and documented with a spot radiographic image. The catheter aspirates and flushes normally. The catheter was flushed with appropriate volume heparin dwells. The catheter exit site was secured with a 3 0 Ethilon retention suture. The venotomy incision was closed with Dermabond. Dressings were applied. The patient tolerated the procedure well without immediate post procedural complication. FINDINGS: After catheter placement, the tip lies within the superior cavoatrial junction. The catheter aspirates and flushes normally and is ready for immediate use. IMPRESSION: Successful placement of 25cm dual lumen tunneled PICC catheter via the right internal jugular vein with tip terminating at the superior caval atrial junction. The catheter is ready for immediate use. Electronically Signed   By: Jerilynn Mages.  Shick M.D.   On: 02/05/2020 16:09   IR US Guide Vasc Access Right  Result Date: 02/05/2020 INDICATION: MRSA bacteremia, cellulitis, access for long-term antibiotics EXAM: TUNNELED PICC LINE WITH ULTRASOUND AND FLUOROSCOPIC GUIDANCE MEDICATIONS: 1% lidocaine local. ANESTHESIA/SEDATION: Moderate Sedation Time:  None. The patient was continuously monitored during the procedure by the interventional radiology nurse under my direct supervision. FLUOROSCOPY TIME:  Fluoroscopy Time: 0 minutes 6 seconds (1 mGy). COMPLICATIONS: None immediate. PROCEDURE: Informed written consent was obtained from the patient after a  discussion of the risks, benefits, and alternatives to treatment. Questions regarding the procedure were encouraged and answered. The right neck and chest were prepped with chlorhexidine in a sterile fashion, and a sterile drape was applied covering the operative field. Maximum barrier sterile technique with sterile gowns and gloves were used for the procedure. A timeout was performed prior to the initiation  of the procedure. After creating a small venotomy incision, a micropuncture kit was utilized to access the right internal jugular vein under direct, real-time ultrasound guidance after the overlying soft tissues were anesthetized with 1% lidocaine with epinephrine. Ultrasound image documentation was performed. The microwire was kinked to measure appropriate catheter length. The micropuncture sheath was exchanged for a peel-away sheath over a guidewire. A 5 French dual lumen tunneled PICC measuring 25 cm was tunneled in a retrograde fashion from the anterior chest wall to the venotomy incision. The catheter was then placed through the peel-away sheath with tip ultimately positioned at the superior caval-atrial junction. Final catheter positioning was confirmed and documented with a spot radiographic image. The catheter aspirates and flushes normally. The catheter was flushed with appropriate volume heparin dwells. The catheter exit site was secured with a 3 0 Ethilon retention suture. The venotomy incision was closed with Dermabond. Dressings were applied. The patient tolerated the procedure well without immediate post procedural complication. FINDINGS: After catheter placement, the tip lies within the superior cavoatrial junction. The catheter aspirates and flushes normally and is ready for immediate use. IMPRESSION: Successful placement of 25cm dual lumen tunneled PICC catheter via the right internal jugular vein with tip terminating at the superior caval atrial junction. The catheter is ready for immediate  use. Electronically Signed   By: Jerilynn Mages.  Shick M.D.   On: 02/05/2020 16:09   DG Chest Portable 1 View  Result Date: 02/01/2020 CLINICAL DATA:  Cough. EXAM: PORTABLE CHEST 1 VIEW COMPARISON:  Radiograph 04/01/2017 FINDINGS: Heart is normal in size. Normal mediastinal contours. Remote left rib fractures with minor scarring at the left lung base. No acute or confluent airspace disease. No pleural fluid or pneumothorax. No pulmonary edema. Left upper quadrant vascular coils. IMPRESSION: 1. No acute abnormality. 2. Remote left rib fractures with minor scarring at the left lung base. Electronically Signed   By: Keith Rake M.D.   On: 02/01/2020 03:41   ECHOCARDIOGRAM COMPLETE  Result Date: 02/02/2020    ECHOCARDIOGRAM REPORT   Patient Name:   DERELLE COCKRELL Date of Exam: 02/02/2020 Medical Rec #:  [id number]        Height:       70.0 in Accession #:    [id number]       Weight:       159.4 lb Date of Birth:  Jan 13, 1961       BSA:          1.895 m Patient Age:    75 years         BP:           142/84 mmHg Patient Gender: M                HR:           84 bpm. Exam Location:  Inpatient Procedure: 2D Echo Indications:    bacteremia  History:        Patient has no prior history of Echocardiogram examinations.                 Risk Factors:Diabetes and Hypertension.  Sonographer:    Jannett Celestine RDCS (AE) Referring Phys: [place]  1. There is a linear independently mobile echodensity in the LVOT (see image 30). This cannot be clearly traced back to a structure, but aortic valve endocarditis not excluded. Left ventricular ejection fraction, by estimation, is 65 to 70%. The left ventricle has normal function. The left ventricle has  no regional wall motion abnormalities. Left ventricular diastolic parameters were normal.  2. Right ventricular systolic function is normal. The right ventricular size is normal. Tricuspid regurgitation signal is inadequate for assessing PA pressure.  3. The mitral  valve is normal in structure. Trivial mitral valve regurgitation. No evidence of mitral stenosis.  4. Bicuspid aortic valve, with fusion of the RCC and LCC (image 20) with focal calcification. Mild AI. Marland Kitchen The aortic valve is bicuspid. Aortic valve regurgitation is mild. Mild aortic valve sclerosis is present, with no evidence of aortic valve stenosis.  5. The inferior vena cava is normal in size with greater than 50% respiratory variability, suggesting right atrial pressure of 3 mmHg. Comparison(s): No prior Echocardiogram. Conclusion(s)/Recommendation(s): Bicuspid aortic valve. There is an independently mobile linear echodensity seen in the LVOT. Given bacteremia, would recommend TEE to further evaluate for endocarditis. FINDINGS  Left Ventricle: There is a linear independently mobile echodensity in the LVOT (see image 30). This cannot be clearly traced back to a structure, but aortic valve endocarditis not excluded. Left ventricular ejection fraction, by estimation, is 65 to 70%. The left ventricle has normal function. The left ventricle has no regional wall motion abnormalities. The left ventricular internal cavity size was normal in size. There is borderline left ventricular hypertrophy. Left ventricular diastolic parameters were normal. Right Ventricle: The right ventricular size is normal. No increase in right ventricular wall thickness. Right ventricular systolic function is normal. Tricuspid regurgitation signal is inadequate for assessing PA pressure. Left Atrium: Left atrial size was normal in size. Right Atrium: Right atrial size was normal in size. Pericardium: There is no evidence of pericardial effusion. Mitral Valve: The mitral valve is normal in structure. Trivial mitral valve regurgitation. No evidence of mitral valve stenosis. Tricuspid Valve: The tricuspid valve is normal in structure. Tricuspid valve regurgitation is trivial. No evidence of tricuspid stenosis. Aortic Valve: Bicuspid aortic valve,  with fusion of the RCC and LCC (image 20) with focal calcification. Mild AI. The aortic valve is bicuspid. . There is mild thickening and mild calcification of the aortic valve. Aortic valve regurgitation is mild. Mild aortic valve sclerosis is present, with no evidence of aortic valve stenosis. There is mild thickening of the aortic valve. There is mild calcification of the aortic valve. Pulmonic Valve: The pulmonic valve was grossly normal. Pulmonic valve regurgitation is trivial. Aorta: The aortic root was not well visualized, the ascending aorta was not well visualized and the aortic arch was not well visualized. Venous: The inferior vena cava is normal in size with greater than 50% respiratory variability, suggesting right atrial pressure of 3 mmHg. IAS/Shunts: The atrial septum is grossly normal.  LEFT VENTRICLE PLAX 2D LVIDd:         4.70 cm  Diastology LVIDs:         2.80 cm  LV e' lateral:   7.62 cm/s LV PW:         1.10 cm  LV E/e' lateral: 9.7 LV IVS:        1.20 cm  LV e' medial:    5.98 cm/s LVOT diam:     2.20 cm  LV E/e' medial:  12.4 LV SV:         87 LV SV Index:   46 LVOT Area:     3.80 cm  RIGHT VENTRICLE RV S prime:     9.46 cm/s TAPSE (M-mode): 1.7 cm LEFT ATRIUM           Index  RIGHT ATRIUM          Index LA diam:      3.00 cm 1.58 cm/m  RA Area:     8.89 cm LA Vol (A2C): 20.6 ml 10.87 ml/m RA Volume:   15.00 ml 7.91 ml/m LA Vol (A4C): 21.9 ml 11.55 ml/m  AORTIC VALVE LVOT Vmax:   95.30 cm/s LVOT Vmean:  71.000 cm/s LVOT VTI:    0.229 m  AORTA Ao Root diam: 3.60 cm MITRAL VALVE MV Area (PHT): 3.27 cm     SHUNTS MV Decel Time: 232 msec     Systemic VTI:  0.23 m MV E velocity: 74.10 cm/s   Systemic Diam: 2.20 cm MV A velocity: 102.00 cm/s MV E/A ratio:  0.73 Buford Dresser MD Electronically signed by Buford Dresser MD Signature Date/Time: 02/02/2020/4:39:02 PM    Final    ECHO TEE  Result Date: 02/04/2020    TRANSESOPHOGEAL ECHO REPORT   Patient Name:   BRANTON EINSTEIN Date of Exam: 02/04/2020 Medical Rec #:  [id number]        Height:       70.0 in Accession #:    [id number]       Weight:       159.4 lb Date of Birth:  Apr 20, 1961       BSA:          1.895 m Patient Age:    63 years         BP:           156/81 mmHg Patient Gender: M                HR:           64 bpm. Exam Location:  Inpatient Procedure: Transesophageal Echo and Color Doppler Indications:     Bacteremia  History:         Patient has prior history of Echocardiogram examinations, most                  recent 02/02/2020. Risk Factors:Diabetes, Hypertension and                  Dyslipidemia. CKD.  Sonographer:     Clayton Lefort RDCS (AE) Referring Phys:  [id number] [place] Diagnosing Phys: Buford Dresser MD PROCEDURE: After discussion of the risks and benefits of a TEE, an informed consent was obtained from the patient. The transesophogeal probe was passed without difficulty through the esophogus of the patient. Local oropharyngeal anesthetic was provided with viscous lidocaine. Sedation performed by different physician. The patient was monitored while under deep sedation. Anesthestetic sedation was provided intravenously by Anesthesiology: 139.32m of Propofol. Image quality was good. The patient's vital signs; including heart rate, blood pressure, and oxygen saturation; remained stable throughout the procedure. The patient developed no complications during the procedure. IMPRESSIONS  1. Left ventricular ejection fraction, by estimation, is 65 to 70%. The left ventricle has normal function. The left ventricle has no regional wall motion abnormalities.  2. Right ventricular systolic function is normal. The right ventricular size is normal.  3. No left atrial/left atrial appendage thrombus was detected.  4. The mitral valve is normal in structure. Trivial mitral valve regurgitation. No evidence of mitral stenosis.  5. The aortic valve is bicuspid. Aortic valve regurgitation is trivial. No aortic stenosis  is present.  6. There is mild (Grade II) plaque involving the descending aorta. Conclusion(s)/Recommendation(s): No evidence of endocarditis. Bicuspid aortic valve. Structure seen on  TTE is associated with the mitral valve, not the aortic, near the mitral-aortic annular junction. This appears to be a chordae, not consistent with vegetation. FINDINGS  Left Ventricle: Left ventricular ejection fraction, by estimation, is 65 to 70%. The left ventricle has normal function. The left ventricle has no regional wall motion abnormalities. The left ventricular internal cavity size was normal in size. There is  borderline left ventricular hypertrophy. Right Ventricle: The right ventricular size is normal. No increase in right ventricular wall thickness. Right ventricular systolic function is normal. Left Atrium: Left atrial size was not assessed. No left atrial/left atrial appendage thrombus was detected. Right Atrium: Right atrial size was not assessed. Pericardium: There is no evidence of pericardial effusion. Mitral Valve: The mitral valve is normal in structure. Trivial mitral valve regurgitation. No evidence of mitral valve stenosis. Tricuspid Valve: The tricuspid valve is normal in structure. Tricuspid valve regurgitation is trivial. No evidence of tricuspid stenosis. Aortic Valve: The aortic valve is bicuspid. Aortic valve regurgitation is trivial. No aortic stenosis is present. Pulmonic Valve: The pulmonic valve was grossly normal. Pulmonic valve regurgitation is trivial. Aorta: The aortic root is normal in size and structure. There is mild (Grade II) plaque involving the descending aorta. IAS/Shunts: No atrial level shunt detected by color flow Doppler. Buford Dresser MD Electronically signed by Buford Dresser MD Signature Date/Time: 02/04/2020/5:29:42 PM    Final      Time Spent in minutes  30     Desiree Hane M.D on 02/06/2020 at 12:18 PM  To page go to [email or web address] - password  [facility]

## 2020-02-06 NOTE — TOC Initial Note (Addendum)
Transition of Care [facility]) - Initial/Assessment Note    Patient Details  Name: Peter [name] MRN: [id number] Date of Birth: 01/15/1961  [facility] of [facility]) CM/SW Contact:    Marilu Favre, RN Phone Number: 02/06/2020, 2:57 PM  Clinical Narrative:                 Spoke to patient at bedside. Confirmed face sheet information. Patient from home with wife. Best contact number is his wife's cell Connie [phone].   Discussed IV ABX and wound care at home.   Prior to discharge Pam with [facility] will teach him and family how to adm ABX because [facility] will not be present everytime a dose is due.   Also bedside nurse will teach patient and family wound care because [facility] will not be present everytime dressing needs to be changed.   Patient voiced understanding and stated his wife and daughter would be willing to assist.   His wife is working 12 hours today and tomorrow and is not allowed to receive phone calls while at work. Patient will discuss with her this evening.   Tommi Rumps with [place] home [facility] and Pam with [facility] have accepted referral. Will education done prior to discharge.  Expected Discharge Plan: [place] Barriers to Discharge: Continued Medical Work up   Patient Goals and CMS Choice Patient states their goals for this hospitalization and ongoing recovery are:: to go home CMS Medicare.gov Compare Post Acute Care list provided to:: Patient Choice offered to / list presented to : Patient  Expected Discharge Plan and Services Expected Discharge Plan: [place]   Discharge Planning Services: CM Consult Post Acute Care Choice: [place] arrangements for the past 2 months: Single Family Home                 DME Arranged: N/A         HH Arranged: RN [place] Agency: [place] Date [place]: 02/06/20 Time [place] Agency Contacted: [address] Representative spoke with at [place]:  Tommi Rumps  Prior Living Arrangements/Services Living arrangements for the past 2 months: [place] Lives with:: Spouse Patient language and need for interpreter reviewed:: Yes Do you feel safe going back to the place where you live?: Yes      Need for Family Participation in Patient Care: Yes (Comment) Care giver support system in place?: Yes (comment)   Criminal Activity/Legal Involvement Pertinent to Current Situation/Hospitalization: No - Comment as needed  Activities of Daily Living Home Assistive Devices/Equipment: Other (Comment) (none) ADL Screening (condition at time of admission) Patient's cognitive ability adequate to safely complete daily activities?: Yes Is the patient deaf or have difficulty hearing?: No Does the patient have difficulty seeing, even when wearing glasses/contacts?: No Does the patient have difficulty concentrating, remembering, or making decisions?: No Patient able to express need for assistance with ADLs?: Yes Does the patient have difficulty dressing or bathing?: No Independently performs ADLs?: Yes (appropriate for developmental age) Does the patient have difficulty walking or climbing stairs?: No Weakness of Legs: None Weakness of Arms/Hands: None  Permission Sought/Granted   Permission granted to share information with : No              Emotional Assessment Appearance:: Appears stated age Attitude/Demeanor/Rapport: Engaged Affect (typically observed): Accepting Orientation: : Oriented to Self, Oriented to Place, Oriented to  Time, Oriented to Situation Alcohol / Substance Use: Not Applicable  Psych Involvement: No (comment)  Admission diagnosis:  Cellulitis and abscess of buttock [L02.31, L03.317] Gluteal abscess [L02.31] Acute cystitis without hematuria [N30.00] Acute renal failure superimposed on chronic kidney disease, unspecified CKD stage, unspecified acute renal failure type ([place]) [N17.9, N18.9] Sepsis with acute renal failure  without septic shock, due to unspecified organism, unspecified acute renal failure type ([place]) [A41.9, R65.20, N17.9] Patient Active Problem List   Diagnosis Date Noted  . Sepsis with acute renal failure without septic shock ([place])   . Cellulitis and abscess of buttock 02/01/2020  . Mixed diabetic hyperlipidemia associated with type 2 diabetes mellitus ([place]) 02/01/2020  . Acute renal failure superimposed on stage 4 chronic kidney disease ([place]) 02/01/2020  . Metabolic acidosis 71/21/9758  . Essential hypertension 11/20/2019  . Elevated blood pressure affecting pregnancy in first trimester, antepartum   . PEG (percutaneous endoscopic gastrostomy) status ([place])   . Suprapubic catheter ([place])   . Tachycardia   . Closed fracture of seventh thoracic vertebra ([place])   . 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 Chest trauma   . Fracture   . Multiple rib fractures involving four or more ribs   . Pelvic ring fracture, sequela   . Respiratory failure ([place])   . 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[place])   . Post-operative pain   . Acute blood loss anemia   . Uncontrolled type 2 diabetes mellitus with diabetic nephropathy, without long-term current use of insulin ([place])   . Hyperlipidemia   . Tachypnea   . Secondary hypertension   . Hyperkalemia   . Pressure injury of skin 02/28/2017  . Multiple closed anterior-posterior compression fractures of pelvis ([place]) 02/08/2017  . Multiple fractures of ribs, bilateral, initial encounter for closed fracture 02/07/2017   PCP:  Raelene Bott, MD Pharmacy:   [id number] - [place], [place] [address] Phone: [phone] Fax: [phone]     Social Determinants of Health (SDOH) Interventions    Readmission Risk Interventions No flowsheet data found.

## 2020-02-06 NOTE — Progress Notes (Signed)
2 Days Post-Op  Subjective: No new complaints today.  ROS: See above, otherwise other systems negative  Objective: Vital signs in last 24 hours: Temp:  [97.6 F (36.4 C)-98.3 F (36.8 C)] 98.2 F (36.8 C) (08/19 0626) Pulse Rate:  [62-79] 79 (08/19 0626) Resp:  [16-20] 20 (08/19 0626) BP: (116-180)/(64-85) 152/74 (08/19 0626) SpO2:  [96 %-98 %] 97 % (08/19 0626) Last BM Date: 02/05/20  Intake/Output from previous day: 08/18 0701 - 08/19 0700 In: 568.6 [P.O.:460; IV Piggyback:108.6] Out: 1600 [Urine:1600] Intake/Output this shift: No intake/output data recorded.  PE: Skin: wounds actually look quite a bit better today.  Superior wounds are making good progress and cleaning up nicely.  Inferior wounds with less necrotic tissue present.  Stable skin breakdown on left side.  Lab Results:  Recent Labs    02/05/20 0959 02/06/20 0354  WBC 10.8* 11.0*  HGB 7.6* 7.6*  HCT 24.1* 24.0*  PLT 348 360   BMET Recent Labs    02/05/20 0959 02/06/20 0354  NA 140 139  K 4.3 4.4  CL 100 100  CO2 32 29  GLUCOSE 162* 103*  BUN 57* 58*  CREATININE 3.82* 3.84*  CALCIUM 7.8* 7.9*   PT/INR No results for input(s): LABPROT, INR in the last 72 hours. CMP     Component Value Date/Time   NA 139 02/06/2020 0354   K 4.4 02/06/2020 0354   CL 100 02/06/2020 0354   CO2 29 02/06/2020 0354   GLUCOSE 103 (H) 02/06/2020 0354   BUN 58 (H) 02/06/2020 0354   CREATININE 3.84 (H) 02/06/2020 0354   CALCIUM 7.9 (L) 02/06/2020 0354   PROT 5.5 (L) 02/02/2020 0225   ALBUMIN 2.0 (L) 02/03/2020 0402   AST 26 02/02/2020 0225   ALT 29 02/02/2020 0225   ALKPHOS 84 02/02/2020 0225   BILITOT 0.4 02/02/2020 0225   GFRNONAA 16 (L) 02/06/2020 0354   GFRAA 19 (L) 02/06/2020 0354   Lipase  No results found for: LIPASE     Studies/Results: IR Fluoro Guide CV Line Right  Result Date: 02/05/2020 INDICATION: MRSA bacteremia, cellulitis, access for long-term antibiotics EXAM: TUNNELED PICC  LINE WITH ULTRASOUND AND FLUOROSCOPIC GUIDANCE MEDICATIONS: 1% lidocaine local. ANESTHESIA/SEDATION: Moderate Sedation Time:  None. The patient was continuously monitored during the procedure by the interventional radiology nurse under my direct supervision. FLUOROSCOPY TIME:  Fluoroscopy Time: 0 minutes 6 seconds (1 mGy). COMPLICATIONS: None immediate. PROCEDURE: Informed written consent was obtained from the patient after a discussion of the risks, benefits, and alternatives to treatment. Questions regarding the procedure were encouraged and answered. The right neck and chest were prepped with chlorhexidine in a sterile fashion, and a sterile drape was applied covering the operative field. Maximum barrier sterile technique with sterile gowns and gloves were used for the procedure. A timeout was performed prior to the initiation of the procedure. After creating a small venotomy incision, a micropuncture kit was utilized to access the right internal jugular vein under direct, real-time ultrasound guidance after the overlying soft tissues were anesthetized with 1% lidocaine with epinephrine. Ultrasound image documentation was performed. The microwire was kinked to measure appropriate catheter length. The micropuncture sheath was exchanged for a peel-away sheath over a guidewire. A 5 French dual lumen tunneled PICC measuring 25 cm was tunneled in a retrograde fashion from the anterior chest wall to the venotomy incision. The catheter was then placed through the peel-away sheath with tip ultimately positioned at the superior caval-atrial junction. Final catheter positioning  was confirmed and documented with a spot radiographic image. The catheter aspirates and flushes normally. The catheter was flushed with appropriate volume heparin dwells. The catheter exit site was secured with a 3 0 Ethilon retention suture. The venotomy incision was closed with Dermabond. Dressings were applied. The patient tolerated the procedure  well without immediate post procedural complication. FINDINGS: After catheter placement, the tip lies within the superior cavoatrial junction. The catheter aspirates and flushes normally and is ready for immediate use. IMPRESSION: Successful placement of 25cm dual lumen tunneled PICC catheter via the right internal jugular vein with tip terminating at the superior caval atrial junction. The catheter is ready for immediate use. Electronically Signed   By: Jerilynn Mages.  Shick M.D.   On: 02/05/2020 16:09   IR US Guide Vasc Access Right  Result Date: 02/05/2020 INDICATION: MRSA bacteremia, cellulitis, access for long-term antibiotics EXAM: TUNNELED PICC LINE WITH ULTRASOUND AND FLUOROSCOPIC GUIDANCE MEDICATIONS: 1% lidocaine local. ANESTHESIA/SEDATION: Moderate Sedation Time:  None. The patient was continuously monitored during the procedure by the interventional radiology nurse under my direct supervision. FLUOROSCOPY TIME:  Fluoroscopy Time: 0 minutes 6 seconds (1 mGy). COMPLICATIONS: None immediate. PROCEDURE: Informed written consent was obtained from the patient after a discussion of the risks, benefits, and alternatives to treatment. Questions regarding the procedure were encouraged and answered. The right neck and chest were prepped with chlorhexidine in a sterile fashion, and a sterile drape was applied covering the operative field. Maximum barrier sterile technique with sterile gowns and gloves were used for the procedure. A timeout was performed prior to the initiation of the procedure. After creating a small venotomy incision, a micropuncture kit was utilized to access the right internal jugular vein under direct, real-time ultrasound guidance after the overlying soft tissues were anesthetized with 1% lidocaine with epinephrine. Ultrasound image documentation was performed. The microwire was kinked to measure appropriate catheter length. The micropuncture sheath was exchanged for a peel-away sheath over a  guidewire. A 5 French dual lumen tunneled PICC measuring 25 cm was tunneled in a retrograde fashion from the anterior chest wall to the venotomy incision. The catheter was then placed through the peel-away sheath with tip ultimately positioned at the superior caval-atrial junction. Final catheter positioning was confirmed and documented with a spot radiographic image. The catheter aspirates and flushes normally. The catheter was flushed with appropriate volume heparin dwells. The catheter exit site was secured with a 3 0 Ethilon retention suture. The venotomy incision was closed with Dermabond. Dressings were applied. The patient tolerated the procedure well without immediate post procedural complication. FINDINGS: After catheter placement, the tip lies within the superior cavoatrial junction. The catheter aspirates and flushes normally and is ready for immediate use. IMPRESSION: Successful placement of 25cm dual lumen tunneled PICC catheter via the right internal jugular vein with tip terminating at the superior caval atrial junction. The catheter is ready for immediate use. Electronically Signed   By: Jerilynn Mages.  Shick M.D.   On: 02/05/2020 16:09    Anti-infectives: Anti-infectives (From admission, onward)   Start     Dose/Rate Route Frequency Ordered Stop   02/04/20 2000  DAPTOmycin (CUBICIN) 600 mg in sodium chloride 0.9 % IVPB        600 mg 224 mL/hr over 30 Minutes Intravenous Every 48 hours 02/03/20 1426     02/02/20 1430  DAPTOmycin (CUBICIN) 500 mg in sodium chloride 0.9 % IVPB  Status:  Discontinued        500 mg 220 mL/hr over 30  Minutes Intravenous Every 48 hours 02/02/20 1332 02/03/20 1426   02/01/20 0800  meropenem (MERREM) 500 mg in sodium chloride 0.9 % 100 mL IVPB  Status:  Discontinued        500 mg 200 mL/hr over 30 Minutes Intravenous Every 12 hours 02/01/20 0656 02/02/20 1304   02/01/20 0655  vancomycin variable dose per unstable renal function (pharmacist dosing)  Status:  Discontinued          Does not apply See admin instructions 02/01/20 0656 02/02/20 1305   02/01/20 0300  ceFEPIme (MAXIPIME) 2 g in sodium chloride 0.9 % 100 mL IVPB        2 g 200 mL/hr over 30 Minutes Intravenous  Once 02/01/20 0252 02/01/20 0446   02/01/20 0300  vancomycin (VANCOREADY) IVPB 1500 mg/300 mL        1,500 mg 150 mL/hr over 120 Minutes Intravenous  Once 02/01/20 0252 02/01/20 0624       Assessment/Plan DM CKD/AKI- Creatinine -3.84, stable Anemia-Hgb - 8.4 - 02/02/2020 HTN MRSA bacteremia  POD5, s/pIRRIGATION AND DEBRIDEMENT ABSCESSof multiple buttocks abscesses - 8/14 - Newman -cxs revealMRSA. abx per ID -WBC stable at11K -PT hydrotherapy to help with necrotic tissue -santyl to wound edges and bases -slowly improving, will decrease dressing changes to BID at this point  FEN -carb mod VTE -heparin ID -cubicin   LOS: 5 days    Henreitta Cea , [facility] Surgery 02/06/2020, 8:07 AM Please see Amion for pager number during day hours 7:00am-4:30pm or 7:00am -11:30am on weekends

## 2020-02-06 NOTE — Progress Notes (Signed)
Physical Therapy Wound Treatment Patient Details  Name: Peter [name] MRN: [id number] Date of Birth: [birth year]  Today's Date: 02/06/2020 Time: 9024-0973 Time Calculation (min): 42 min  Subjective  Subjective: "They started the ointment last night." Patient and Family Stated Goals: heal wounds Date of Onset: 02/03/20 Prior Treatments: topical treatment  Pain Score: 6/10; premedicated  Wound Assessment  Wound / Incision (Open or Dehisced) 02/04/20 Incision - Open Buttocks Right Right superior buttock wound  (Active)  Wound Image   02/04/20 1300  Dressing Type Barrier Film (skin prep);Gauze (Comment);Moist to moist 02/06/20 1257  Dressing Changed Changed 02/06/20 1257  Dressing Status Clean;Dry;Intact 02/06/20 1257  Dressing Change Frequency Twice a day 02/06/20 1257  Site / Wound Assessment Brown;Yellow;Red;Pink;Granulation tissue 02/06/20 1257  % Wound base Red or Granulating 40% 02/06/20 1257  % Wound base Yellow/Fibrinous Exudate 50% 02/06/20 1257  % Wound base Black/Eschar 10% 02/06/20 1257  Peri-wound Assessment Induration;Erythema (non-blanchable);Denuded 02/06/20 1257  Wound Length (cm) 3.2 cm 02/04/20 1300  Wound Width (cm) 2.4 cm 02/04/20 1300  Wound Depth (cm) 1.9 cm 02/04/20 1300  Wound Volume (cm^3) 14.59 cm^3 02/04/20 1300  Wound Surface Area (cm^2) 7.68 cm^2 02/04/20 1300  Margins Unattached edges (unapproximated) 02/06/20 1257  Closure None 02/05/20 2107  Drainage Amount Minimal 02/06/20 1257  Drainage Description No odor;Serosanguineous 02/06/20 1257  Treatment Debridement (Selective);Hydrotherapy (Pulse lavage);Packing (Saline gauze) 02/06/20 1257     Wound / Incision (Open or Dehisced) 02/04/20 Incision - Open Buttocks Right R central buttock incision  (Active)  Wound Image   02/04/20 1300  Dressing Type ABD;Barrier Film (skin prep);Gauze (Comment);Moist to moist 02/06/20 1257  Dressing Changed Changed 02/06/20 1257  Dressing Status Clean;Dry;Intact  02/06/20 1257  Dressing Change Frequency Twice a day 02/06/20 1257  Site / Wound Assessment Yellow;Friable;Pink 02/06/20 1257  % Wound base Red or Granulating 0% 02/06/20 1257  % Wound base Yellow/Fibrinous Exudate 100% 02/06/20 1257  % Wound base Black/Eschar 0% 02/06/20 1257  % Wound base Other/Granulation Tissue (Comment) 0% 02/06/20 1257  Peri-wound Assessment Erythema (non-blanchable) 02/06/20 1257  Wound Length (cm) 3.3 cm 02/04/20 1300  Wound Width (cm) 0.7 cm 02/04/20 1300  Wound Depth (cm) 1.2 cm 02/04/20 1300  Wound Volume (cm^3) 2.77 cm^3 02/04/20 1300  Wound Surface Area (cm^2) 2.31 cm^2 02/04/20 1300  Margins Unattached edges (unapproximated) 02/06/20 1257  Closure None 02/05/20 2107  Drainage Amount Minimal 02/06/20 1257  Drainage Description Serosanguineous 02/06/20 1257  Treatment Debridement (Selective);Hydrotherapy (Pulse lavage);Packing (Saline gauze) 02/06/20 1257     Wound / Incision (Open or Dehisced) 02/04/20 Incision - Open Buttocks Right R inferior buttock main  (Active)  Wound Image   02/04/20 1300  Dressing Type ABD;Barrier Film (skin prep);Moist to moist;Gauze (Comment) 02/06/20 1257  Dressing Changed Changed 02/06/20 1257  Dressing Status Clean;Dry;Intact 02/06/20 1257  Dressing Change Frequency Twice a day 02/06/20 1257  Site / Wound Assessment Yellow;Pink;Friable 02/06/20 1257  % Wound base Red or Granulating 0% 02/06/20 1257  % Wound base Yellow/Fibrinous Exudate 100% 02/06/20 1257  % Wound base Black/Eschar 0% 02/06/20 1257  % Wound base Other/Granulation Tissue (Comment) 0% 02/06/20 1257  Peri-wound Assessment Erythema (non-blanchable);Denuded;Induration 02/06/20 1257  Wound Length (cm) 5.2 cm 02/04/20 1300  Wound Width (cm) 3.3 cm 02/04/20 1300  Wound Depth (cm) 2.2 cm 02/04/20 1300  Wound Volume (cm^3) 37.75 cm^3 02/04/20 1300  Wound Surface Area (cm^2) 17.16 cm^2 02/04/20 1300  Margins Unattached edges (unapproximated) 02/06/20 1257  Closure  None 02/05/20 2107  Drainage Amount  Minimal 02/06/20 1257  Drainage Description Serosanguineous 02/06/20 1257  Treatment Debridement (Selective);Hydrotherapy (Pulse lavage);Packing (Saline gauze) 02/06/20 1257        Wound / Incision (Open or Dehisced) 02/04/20 Incision - Open Buttocks Left L central buttock  (Active)  Wound Image   02/04/20 1400  Dressing Type ABD;Barrier Film (skin prep);Gauze (Comment) 02/06/20 1257  Dressing Changed Changed 02/06/20 1257  Dressing Status Clean;Dry;Intact 02/06/20 1257  Dressing Change Frequency Twice a day 02/06/20 1257  Site / Wound Assessment Granulation tissue;Yellow;Red;Brown 02/06/20 1257  % Wound base Red or Granulating 10% 02/05/20 2107  % Wound base Yellow/Fibrinous Exudate 60% 02/05/20 2107  % Wound base Black/Eschar 30% 02/05/20 2107  Peri-wound Assessment Erythema (non-blanchable);Denuded;Induration 02/06/20 1257  Wound Length (cm) 4.8 cm 02/04/20 1400  Wound Width (cm) 1.3 cm 02/04/20 1400  Wound Depth (cm) 2.4 cm 02/04/20 1400  Wound Volume (cm^3) 14.98 cm^3 02/04/20 1400  Wound Surface Area (cm^2) 6.24 cm^2 02/04/20 1400  Margins Unattached edges (unapproximated) 02/06/20 1257  Closure None 02/05/20 2107  Drainage Amount Minimal 02/06/20 1257  Drainage Description Serosanguineous 02/06/20 1257  Treatment Debridement (Selective);Hydrotherapy (Pulse lavage);Packing (Saline gauze) 02/06/20 1257     Wound / Incision (Open or Dehisced) 02/04/20 Incision - Open Buttocks Left L inferior buttock  (Active)  Wound Image   02/04/20 1300  Dressing Type ABD;Barrier Film (skin prep);Moist to moist 02/06/20 1257  Dressing Changed Changed 02/06/20 1257  Dressing Status Clean;Dry;Intact 02/06/20 1257  Dressing Change Frequency Twice a day 02/06/20 1257  Site / Wound Assessment Pink;Yellow 02/06/20 1257  % Wound base Red or Granulating 0% 02/06/20 1257  % Wound base Yellow/Fibrinous Exudate 50% 02/06/20 1257  % Wound base Black/Eschar 50%  02/06/20 1257  % Wound base Other/Granulation Tissue (Comment) 0% 02/06/20 1257  Peri-wound Assessment Erythema (non-blanchable);Denuded;Induration 02/06/20 1257  Wound Length (cm) 4.7 cm 02/04/20 1400  Wound Width (cm) 1.2 cm 02/04/20 1400  Wound Depth (cm) 1.8 cm 02/04/20 1400  Wound Volume (cm^3) 10.15 cm^3 02/04/20 1400  Wound Surface Area (cm^2) 5.64 cm^2 02/04/20 1400  Margins Unattached edges (unapproximated) 02/06/20 1257  Closure None 02/05/20 2107  Drainage Amount Minimal 02/06/20 1257  Drainage Description Serosanguineous 02/06/20 1257  Treatment Debridement (Selective);Hydrotherapy (Pulse lavage);Packing (Saline gauze) 02/06/20 1257      Hydrotherapy Pulsed lavage therapy - wound location: all buttock wounds Pulsed Lavage with Suction (psi): 12 psi Pulsed Lavage with Suction - Normal Saline Used: 2000 mL Pulsed Lavage Tip: Tip with splash shield Selective Debridement Selective Debridement - Location: L and R buttock wounds  Selective Debridement - Tools Used: Forceps;Scalpel Selective Debridement - Tissue Removed: necrotic fibrin and eschar/cauterized tissue   Wound Assessment and Plan  Wound Therapy - Assess/Plan/Recommendations Wound Therapy - Clinical Statement: Continued removal of yellow slough, increased sangineous drainage. Pt continues to have decreased pain in wounds despite depth and breadth. Pt will continue to benefit from pulse lavage and selective debridement to reduce bioburden and promote healing.  Wound Therapy - Functional Problem List: uncontrolled DM Factors Delaying/Impairing Wound Healing: Diabetes Mellitus;Altered sensation;Infection - systemic/local;Multiple medical problems Hydrotherapy Plan: Debridement;Dressing change;Pulsatile lavage with suction;Patient/family education Wound Therapy - Frequency: 6X / week Wound Therapy - Follow Up Recommendations: [place] Wound Plan: see above  Wound Therapy Goals- Improve the function of  patient's integumentary system by progressing the wound(s) through the phases of wound healing (inflammation - proliferation - remodeling) by: Decrease Necrotic Tissue to: 0 Decrease Necrotic Tissue - Progress: Progressing toward goal Increase Granulation Tissue to:  100 Increase Granulation Tissue - Progress: Progressing toward goal Goals/treatment plan/discharge plan were made with and agreed upon by patient/family: Yes Time For Goal Achievement: 7 days Wound Therapy - Potential for Goals: Fair  Goals will be updated until maximal potential achieved or discharge criteria met.  Discharge criteria: when goals achieved, discharge from hospital, MD decision/surgical intervention, no progress towards goals, refusal/missing three consecutive treatments without notification or medical reason.  GP      Wyona Almas, PT, DPT Acute Rehabilitation Services Pager [phone] Office [phone]   Deno Etienne 02/06/2020, 1:09 PM

## 2020-02-06 NOTE — Progress Notes (Signed)
Pharmacy Antibiotic Note  Peter [name] is a 59 y.o. male admitted on 01/31/2020 with cellulitis and abscess of buttock. Patient was started on vancomycin and meropenem, and went to OR for I&D and debridement of wound on 8/14. Patient subsequently found to have MRSA bacteremia, and wound cultures also growing Staph aureus (peliminary result).  Pharmacy has been consulted to switch antibiotics to daptomycin due to unstable renal function.  Patient with AKI. Unclear what current baseline is as last SCr before admission was in 2018. SCr has trended down to 3.84 with CrCl ~ 21 ml/min.   CK WNL at 161; off Zocor due to increased risk of myopathy and rhabdomyolysis.   Plan: Daptomycin 600 mg every 48 hours (~8 mg/kg)  CK qMon Monitor renal fxn, mico data, clinical progres  Height: 5\' 10"  (177.8 cm) Weight: 72.3 kg (159 lb 6.3 oz) IBW/kg (Calculated) : 73  Temp (24hrs), Avg:98.2 F (36.8 C), Min:97.8 F (36.6 C), Max:98.3 F (36.8 C)  Recent Labs  Lab 01/31/20 1608 02/01/20 0220 02/01/20 0831 02/01/20 0831 02/02/20 0225 02/03/20 0402 02/04/20 0319 02/04/20 1240 02/05/20 0959 02/06/20 0354  WBC   < >  --  30.5*  --  21.4*  --  11.1*  --  10.8* 11.0*  CREATININE   < >  --  6.93*   < > 6.28* 5.24*  --  4.19* 3.82* 3.84*  LATICACIDVEN  --  1.8  --   --   --   --   --   --   --   --   VANCORANDOM  --   --   --   --  19  --   --   --   --   --    < > = values in this interval not displayed.    Estimated Creatinine Clearance: 21.2 mL/min (A) (by C-G formula based on SCr of 3.84 mg/dL (H)).    No Known Allergies  Vanc x1 8/14 Merrem 8/14 >> 8/15 Cefepime x1 8/14 Daptomycin 8/15 >>  8/15 VL = 19 mcg/mL 8/16 CK - 161  8/14 buttocks abscess - Staph aureus (pending) 8/14 buttocks - Staph aureus (pending) 8/14 BCx - MRSA 8/15 MRSA PCR - negative 8/16 BCx - no growth to date   Jimmy Footman, PharmD, BCPS, [facility] Clinical Pharmacist Phone:  [phone] 02/06/2020, 3:19 PM

## 2020-02-07 DIAGNOSIS — E875 Hyperkalemia: Secondary | ICD-10-CM

## 2020-02-07 LAB — BASIC METABOLIC PANEL
Anion gap: 8 (ref 5–15)
Anion gap: 11 (ref 5–15)
Anion gap: 6 (ref 5–15)
BUN: 57 mg/dL — ABNORMAL HIGH (ref 6–20)
BUN: 58 mg/dL — ABNORMAL HIGH (ref 6–20)
BUN: 57 mg/dL — ABNORMAL HIGH (ref 6–20)
CO2: 26 mmol/L (ref 22–32)
CO2: 27 mmol/L (ref 22–32)
CO2: 29 mmol/L (ref 22–32)
Calcium: 7.9 mg/dL — ABNORMAL LOW (ref 8.9–10.3)
Calcium: 8.4 mg/dL — ABNORMAL LOW (ref 8.9–10.3)
Calcium: 8 mg/dL — ABNORMAL LOW (ref 8.9–10.3)
Chloride: 101 mmol/L (ref 98–111)
Chloride: 101 mmol/L (ref 98–111)
Chloride: 103 mmol/L (ref 98–111)
Creatinine, Ser: 3.99 mg/dL — ABNORMAL HIGH (ref 0.61–1.24)
Creatinine, Ser: 4.04 mg/dL — ABNORMAL HIGH (ref 0.61–1.24)
Creatinine, Ser: 3.93 mg/dL — ABNORMAL HIGH (ref 0.61–1.24)
GFR calc Af Amer: 18 mL/min — ABNORMAL LOW (ref >60)
GFR calc Af Amer: 18 mL/min — ABNORMAL LOW (ref >60)
GFR calc Af Amer: 18 mL/min — ABNORMAL LOW (ref >60)
GFR calc non Af Amer: 15 mL/min — ABNORMAL LOW (ref >60)
GFR calc non Af Amer: 15 mL/min — ABNORMAL LOW (ref >60)
GFR calc non Af Amer: 16 mL/min — ABNORMAL LOW (ref >60)
Glucose, Bld: 132 mg/dL — ABNORMAL HIGH (ref 70–99)
Glucose, Bld: 180 mg/dL — ABNORMAL HIGH (ref 70–99)
Glucose, Bld: 169 mg/dL — ABNORMAL HIGH (ref 70–99)
Potassium: 5.3 mmol/L — ABNORMAL HIGH (ref 3.5–5.1)
Potassium: 5.3 mmol/L — ABNORMAL HIGH (ref 3.5–5.1)
Potassium: 5.8 mmol/L — ABNORMAL HIGH (ref 3.5–5.1)
Sodium: 135 mmol/L (ref 135–145)
Sodium: 139 mmol/L (ref 135–145)
Sodium: 138 mmol/L (ref 135–145)

## 2020-02-07 LAB — CBC WITH DIFFERENTIAL/PLATELET
Abs Immature Granulocytes: 0.41 10*3/uL — ABNORMAL HIGH (ref 0.00–0.07)
Basophils Absolute: 0 10*3/uL (ref 0.0–0.1)
Basophils Relative: 0 %
Eosinophils Absolute: 0.2 10*3/uL (ref 0.0–0.5)
Eosinophils Relative: 2 %
HCT: 24.4 % — ABNORMAL LOW (ref 39.0–52.0)
Hemoglobin: 7.6 g/dL — ABNORMAL LOW (ref 13.0–17.0)
Immature Granulocytes: 4 %
Lymphocytes Relative: 22 %
Lymphs Abs: 2.2 10*3/uL (ref 0.7–4.0)
MCH: 29.2 pg (ref 26.0–34.0)
MCHC: 31.1 g/dL (ref 30.0–36.0)
MCV: 93.8 fL (ref 80.0–100.0)
Monocytes Absolute: 0.7 10*3/uL (ref 0.1–1.0)
Monocytes Relative: 7 %
Neutro Abs: 6.5 10*3/uL (ref 1.7–7.7)
Neutrophils Relative %: 65 %
Platelets: 351 10*3/uL (ref 150–400)
RBC: 2.6 MIL/uL — ABNORMAL LOW (ref 4.22–5.81)
RDW: 12.1 % (ref 11.5–15.5)
WBC: 10.1 10*3/uL (ref 4.0–10.5)
nRBC: 0 % (ref 0.0–0.2)

## 2020-02-07 LAB — GLUCOSE, CAPILLARY
Glucose-Capillary: 110 mg/dL — ABNORMAL HIGH (ref 70–99)
Glucose-Capillary: 218 mg/dL — ABNORMAL HIGH (ref 70–99)
Glucose-Capillary: 164 mg/dL — ABNORMAL HIGH (ref 70–99)
Glucose-Capillary: 152 mg/dL — ABNORMAL HIGH (ref 70–99)

## 2020-02-07 MED ORDER — HYDRALAZINE HCL 25 MG PO TABS
25.0000 mg | ORAL_TABLET | Freq: Three times a day (TID) | ORAL | Status: DC
  Administered 2020-02-07 – 2020-02-08 (×3): 25 mg via ORAL
  Filled 2020-02-07 (×3): qty 1

## 2020-02-07 MED ORDER — AMLODIPINE BESYLATE 10 MG PO TABS
10.0000 mg | ORAL_TABLET | Freq: Every day | ORAL | Status: DC
  Administered 2020-02-07 – 2020-02-12 (×6): 10 mg via ORAL
  Filled 2020-02-07 (×6): qty 1

## 2020-02-07 MED ORDER — SODIUM ZIRCONIUM CYCLOSILICATE 5 G PO PACK
5.0000 g | PACK | Freq: Once | ORAL | Status: AC
  Administered 2020-02-07: 5 g via ORAL
  Filled 2020-02-07: qty 1

## 2020-02-07 NOTE — Progress Notes (Signed)
Physical Therapy Wound Treatment Patient Details  Name: Peter [name] MRN: [id number] Date of Birth: May 04, 1961  Today's Date: 02/07/2020 Time: 0768-0881 Time Calculation (min): 33 min  Subjective  Subjective: "It seemed like you got a lot of dead tissue of yesterday." Patient and Family Stated Goals: heal wounds Date of Onset: 02/03/20 Prior Treatments: topical treatment  Pain Score:  8/10 with debridement  Wound Assessment  Wound / Incision (Open or Dehisced) 02/04/20 Incision - Open Buttocks Right Right superior buttock wound  (Active)  Wound Image   02/04/20 1300  Dressing Type ABD;Barrier Film (skin prep);Gauze (Comment) 02/07/20 1158  Dressing Changed Changed 02/07/20 1158  Dressing Status Clean;Dry;New drainage 02/07/20 1158  Dressing Change Frequency Twice a day 02/07/20 1158  Site / Wound Assessment Yellow;Brown;Pink;Red;Granulation tissue 02/07/20 1158  % Wound base Red or Granulating 40% 02/06/20 1257  % Wound base Yellow/Fibrinous Exudate 50% 02/06/20 1257  % Wound base Black/Eschar 10% 02/06/20 1257  Peri-wound Assessment Erythema (non-blanchable);Induration;Denuded 02/07/20 1158  Wound Length (cm) 3.2 cm 02/04/20 1300  Wound Width (cm) 2.4 cm 02/04/20 1300  Wound Depth (cm) 1.9 cm 02/04/20 1300  Wound Volume (cm^3) 14.59 cm^3 02/04/20 1300  Wound Surface Area (cm^2) 7.68 cm^2 02/04/20 1300  Margins Unattached edges (unapproximated) 02/07/20 1158  Closure None 02/05/20 2107  Drainage Amount Minimal 02/07/20 1158  Drainage Description Sanguineous 02/07/20 1158  Treatment Debridement (Selective);Hydrotherapy (Pulse lavage);Packing (Saline gauze) 02/07/20 1158     Wound / Incision (Open or Dehisced) 02/04/20 Incision - Open Buttocks Right R central buttock incision  (Active)  Wound Image   02/04/20 1300  Dressing Type ABD;Barrier Film (skin prep);Gauze (Comment) 02/07/20 1158  Dressing Changed Changed 02/07/20 1158  Dressing Status Clean;Dry;Intact 02/07/20 1158   Dressing Change Frequency Twice a day 02/07/20 1158  Site / Wound Assessment Yellow;Pink;Friable 02/07/20 1158  % Wound base Red or Granulating 0% 02/06/20 1257  % Wound base Yellow/Fibrinous Exudate 100% 02/06/20 1257  % Wound base Black/Eschar 0% 02/06/20 1257  % Wound base Other/Granulation Tissue (Comment) 0% 02/06/20 1257  Peri-wound Assessment Erythema (non-blanchable) 02/07/20 1158  Wound Length (cm) 3.3 cm 02/04/20 1300  Wound Width (cm) 0.7 cm 02/04/20 1300  Wound Depth (cm) 1.2 cm 02/04/20 1300  Wound Volume (cm^3) 2.77 cm^3 02/04/20 1300  Wound Surface Area (cm^2) 2.31 cm^2 02/04/20 1300  Margins Unattached edges (unapproximated) 02/07/20 1158  Closure None 02/05/20 2107  Drainage Amount Minimal 02/07/20 1158  Drainage Description Serosanguineous 02/07/20 1158  Treatment Debridement (Selective);Hydrotherapy (Pulse lavage);Packing (Saline gauze) 02/07/20 1158     Wound / Incision (Open or Dehisced) 02/04/20 Incision - Open Buttocks Right R inferior buttock main  (Active)  Wound Image   02/04/20 1300  Dressing Type ABD;Barrier Film (skin prep);Gauze (Comment) 02/07/20 1158  Dressing Changed Changed 02/07/20 1158  Dressing Status Clean;Intact;Dry 02/07/20 1158  Dressing Change Frequency Twice a day 02/07/20 1158  Site / Wound Assessment Yellow;Pink;Friable 02/07/20 1158  % Wound base Red or Granulating 0% 02/06/20 1257  % Wound base Yellow/Fibrinous Exudate 100% 02/06/20 1257  % Wound base Black/Eschar 0% 02/06/20 1257  % Wound base Other/Granulation Tissue (Comment) 0% 02/06/20 1257  Peri-wound Assessment Erythema (non-blanchable) 02/07/20 1158  Wound Length (cm) 5.2 cm 02/04/20 1300  Wound Width (cm) 3.3 cm 02/04/20 1300  Wound Depth (cm) 2.2 cm 02/04/20 1300  Wound Volume (cm^3) 37.75 cm^3 02/04/20 1300  Wound Surface Area (cm^2) 17.16 cm^2 02/04/20 1300  Margins Unattached edges (unapproximated) 02/07/20 1158  Closure None 02/05/20 2107  Drainage  Amount Minimal  02/07/20 1158  Drainage Description Serosanguineous 02/07/20 1158  Treatment Debridement (Selective);Hydrotherapy (Pulse lavage);Packing (Saline gauze) 02/07/20 1158     Wound / Incision (Open or Dehisced) 02/04/20 Incision - Open Buttocks Left L central buttock  (Active)  Wound Image   02/04/20 1400  Dressing Type ABD;Barrier Film (skin prep);Gauze (Comment);Moist to moist 02/07/20 1158  Dressing Changed Changed 02/07/20 1158  Dressing Status Clean;Dry;Intact 02/07/20 1158  Dressing Change Frequency Twice a day 02/07/20 1158  Site / Wound Assessment Granulation tissue;Red;Yellow;Brown 02/07/20 1158  % Wound base Red or Granulating 10% 02/05/20 2107  % Wound base Yellow/Fibrinous Exudate 60% 02/05/20 2107  % Wound base Black/Eschar 30% 02/05/20 2107  Peri-wound Assessment Erythema (non-blanchable);Denuded;Induration 02/07/20 1158  Wound Length (cm) 4.8 cm 02/04/20 1400  Wound Width (cm) 1.3 cm 02/04/20 1400  Wound Depth (cm) 2.4 cm 02/04/20 1400  Wound Volume (cm^3) 14.98 cm^3 02/04/20 1400  Wound Surface Area (cm^2) 6.24 cm^2 02/04/20 1400  Margins Unattached edges (unapproximated) 02/07/20 1158  Closure None 02/05/20 2107  Drainage Amount Minimal 02/07/20 1158  Drainage Description Sanguineous 02/07/20 1158  Treatment Debridement (Selective);Hydrotherapy (Pulse lavage);Packing (Saline gauze) 02/07/20 1158     Wound / Incision (Open or Dehisced) 02/04/20 Incision - Open Buttocks Left L inferior buttock  (Active)  Wound Image   02/04/20 1300  Dressing Type ABD;Barrier Film (skin prep);Gauze (Comment) 02/07/20 1158  Dressing Changed Changed 02/07/20 1158  Dressing Status Clean;Dry;Intact 02/07/20 1158  Dressing Change Frequency Twice a day 02/07/20 1158  Site / Wound Assessment Yellow;Pink 02/07/20 1158  % Wound base Red or Granulating 0% 02/06/20 1257  % Wound base Yellow/Fibrinous Exudate 50% 02/06/20 1257  % Wound base Black/Eschar 50% 02/06/20 1257  % Wound base  Other/Granulation Tissue (Comment) 0% 02/06/20 1257  Peri-wound Assessment Erythema (non-blanchable);Denuded;Induration 02/07/20 1158  Wound Length (cm) 4.7 cm 02/04/20 1400  Wound Width (cm) 1.2 cm 02/04/20 1400  Wound Depth (cm) 1.8 cm 02/04/20 1400  Wound Volume (cm^3) 10.15 cm^3 02/04/20 1400  Wound Surface Area (cm^2) 5.64 cm^2 02/04/20 1400  Margins Unattached edges (unapproximated) 02/07/20 1158  Closure None 02/05/20 2107  Drainage Amount Minimal 02/07/20 1158  Drainage Description Sanguineous 02/07/20 1158  Treatment Debridement (Selective);Hydrotherapy (Pulse lavage);Packing (Saline gauze) 02/07/20 1158   Santyl applied to wound bed prior to applying dressing.     Hydrotherapy Pulsed lavage therapy - wound location: all buttock wounds Pulsed Lavage with Suction (psi): 12 psi Pulsed Lavage with Suction - Normal Saline Used: 2000 mL Pulsed Lavage Tip: Tip with splash shield Selective Debridement Selective Debridement - Location: L and R buttock wounds  Selective Debridement - Tools Used: Forceps;Scalpel Selective Debridement - Tissue Removed: necrotic fibrin and eschar/cauterized tissue   Wound Assessment and Plan  Wound Therapy - Assess/Plan/Recommendations Wound Therapy - Clinical Statement: Continued removal of yellow slough, pt reporting significant pain with sharp debridement despite premedication. Will continue to benefit from pulsatile lavage and and selective debridement to reduce bioburden and promote healing.  Wound Therapy - Functional Problem List: uncontrolled DM Factors Delaying/Impairing Wound Healing: Diabetes Mellitus;Altered sensation;Infection - systemic/local;Multiple medical problems Hydrotherapy Plan: Debridement;Dressing change;Pulsatile lavage with suction;Patient/family education Wound Therapy - Frequency: 6X / week Wound Therapy - Follow Up Recommendations: [place] Wound Plan: see above  Wound Therapy Goals- Improve the function of  patient's integumentary system by progressing the wound(s) through the phases of wound healing (inflammation - proliferation - remodeling) by: Decrease Necrotic Tissue to: 0 Decrease Necrotic Tissue - Progress: Progressing toward goal  Increase Granulation Tissue to: 100 Increase Granulation Tissue - Progress: Progressing toward goal Goals/treatment plan/discharge plan were made with and agreed upon by patient/family: Yes Time For Goal Achievement: 7 days Wound Therapy - Potential for Goals: Fair  Goals will be updated until maximal potential achieved or discharge criteria met.  Discharge criteria: when goals achieved, discharge from hospital, MD decision/surgical intervention, no progress towards goals, refusal/missing three consecutive treatments without notification or medical reason.  GP    Wyona Almas, PT, DPT Acute Rehabilitation Services Pager [phone] Office [phone]      Deno Etienne 02/07/2020, 12:06 PM

## 2020-02-07 NOTE — Progress Notes (Signed)
3 Days Post-Op  Subjective: Patient with no new complaints.  ROS: See above, otherwise other systems negative  Objective: Vital signs in last 24 hours: Temp:  [97.6 F (36.4 C)-98.2 F (36.8 C)] 98.2 F (36.8 C) (08/20 0540) Pulse Rate:  [60-63] 63 (08/20 0540) Resp:  [15-16] 15 (08/20 0540) BP: (158-177)/(67-75) 158/67 (08/20 0540) SpO2:  [98 %-100 %] 98 % (08/20 0540) Last BM Date: 02/05/20  Intake/Output from previous day: 08/19 0701 - 08/20 0700 In: 1372 [P.O.:1260; IV Piggyback:112] Out: 2525 [Urine:2525] Intake/Output this shift: Total I/O In: 240 [P.O.:240] Out: 400 [Urine:400]  PE: Skin: wounds are making good progress.  Increasing granular tissue in all wounds.  Inferior left wound remains wound with most fibrin still present.  Still with some skin breakdown lateral to this and some induration.  Lab Results:  Recent Labs    02/06/20 0354 02/07/20 0337  WBC 11.0* 10.1  HGB 7.6* 7.6*  HCT 24.0* 24.4*  PLT 360 351   BMET Recent Labs    02/06/20 0354 02/07/20 0337  NA 139 135  K 4.4 5.3*  CL 100 101  CO2 29 26  GLUCOSE 103* 132*  BUN 58* 57*  CREATININE 3.84* 3.99*  CALCIUM 7.9* 7.9*   PT/INR No results for input(s): LABPROT, INR in the last 72 hours. CMP     Component Value Date/Time   NA 135 02/07/2020 0337   K 5.3 (H) 02/07/2020 0337   CL 101 02/07/2020 0337   CO2 26 02/07/2020 0337   GLUCOSE 132 (H) 02/07/2020 0337   BUN 57 (H) 02/07/2020 0337   CREATININE 3.99 (H) 02/07/2020 0337   CALCIUM 7.9 (L) 02/07/2020 0337   PROT 5.5 (L) 02/02/2020 0225   ALBUMIN 2.0 (L) 02/03/2020 0402   AST 26 02/02/2020 0225   ALT 29 02/02/2020 0225   ALKPHOS 84 02/02/2020 0225   BILITOT 0.4 02/02/2020 0225   GFRNONAA 15 (L) 02/07/2020 0337   GFRAA 18 (L) 02/07/2020 0337   Lipase  No results found for: LIPASE     Studies/Results: IR Fluoro Guide CV Line Right  Result Date: 02/05/2020 INDICATION: MRSA bacteremia, cellulitis, access for  long-term antibiotics EXAM: TUNNELED PICC LINE WITH ULTRASOUND AND FLUOROSCOPIC GUIDANCE MEDICATIONS: 1% lidocaine local. ANESTHESIA/SEDATION: Moderate Sedation Time:  None. The patient was continuously monitored during the procedure by the interventional radiology nurse under my direct supervision. FLUOROSCOPY TIME:  Fluoroscopy Time: 0 minutes 6 seconds (1 mGy). COMPLICATIONS: None immediate. PROCEDURE: Informed written consent was obtained from the patient after a discussion of the risks, benefits, and alternatives to treatment. Questions regarding the procedure were encouraged and answered. The right neck and chest were prepped with chlorhexidine in a sterile fashion, and a sterile drape was applied covering the operative field. Maximum barrier sterile technique with sterile gowns and gloves were used for the procedure. A timeout was performed prior to the initiation of the procedure. After creating a small venotomy incision, a micropuncture kit was utilized to access the right internal jugular vein under direct, real-time ultrasound guidance after the overlying soft tissues were anesthetized with 1% lidocaine with epinephrine. Ultrasound image documentation was performed. The microwire was kinked to measure appropriate catheter length. The micropuncture sheath was exchanged for a peel-away sheath over a guidewire. A 5 French dual lumen tunneled PICC measuring 25 cm was tunneled in a retrograde fashion from the anterior chest wall to the venotomy incision. The catheter was then placed through the peel-away sheath with tip ultimately positioned at  the superior caval-atrial junction. Final catheter positioning was confirmed and documented with a spot radiographic image. The catheter aspirates and flushes normally. The catheter was flushed with appropriate volume heparin dwells. The catheter exit site was secured with a 3 0 Ethilon retention suture. The venotomy incision was closed with Dermabond. Dressings were  applied. The patient tolerated the procedure well without immediate post procedural complication. FINDINGS: After catheter placement, the tip lies within the superior cavoatrial junction. The catheter aspirates and flushes normally and is ready for immediate use. IMPRESSION: Successful placement of 25cm dual lumen tunneled PICC catheter via the right internal jugular vein with tip terminating at the superior caval atrial junction. The catheter is ready for immediate use. Electronically Signed   By: Jerilynn Mages.  Shick M.D.   On: 02/05/2020 16:09   IR US Guide Vasc Access Right  Result Date: 02/05/2020 INDICATION: MRSA bacteremia, cellulitis, access for long-term antibiotics EXAM: TUNNELED PICC LINE WITH ULTRASOUND AND FLUOROSCOPIC GUIDANCE MEDICATIONS: 1% lidocaine local. ANESTHESIA/SEDATION: Moderate Sedation Time:  None. The patient was continuously monitored during the procedure by the interventional radiology nurse under my direct supervision. FLUOROSCOPY TIME:  Fluoroscopy Time: 0 minutes 6 seconds (1 mGy). COMPLICATIONS: None immediate. PROCEDURE: Informed written consent was obtained from the patient after a discussion of the risks, benefits, and alternatives to treatment. Questions regarding the procedure were encouraged and answered. The right neck and chest were prepped with chlorhexidine in a sterile fashion, and a sterile drape was applied covering the operative field. Maximum barrier sterile technique with sterile gowns and gloves were used for the procedure. A timeout was performed prior to the initiation of the procedure. After creating a small venotomy incision, a micropuncture kit was utilized to access the right internal jugular vein under direct, real-time ultrasound guidance after the overlying soft tissues were anesthetized with 1% lidocaine with epinephrine. Ultrasound image documentation was performed. The microwire was kinked to measure appropriate catheter length. The micropuncture sheath was  exchanged for a peel-away sheath over a guidewire. A 5 French dual lumen tunneled PICC measuring 25 cm was tunneled in a retrograde fashion from the anterior chest wall to the venotomy incision. The catheter was then placed through the peel-away sheath with tip ultimately positioned at the superior caval-atrial junction. Final catheter positioning was confirmed and documented with a spot radiographic image. The catheter aspirates and flushes normally. The catheter was flushed with appropriate volume heparin dwells. The catheter exit site was secured with a 3 0 Ethilon retention suture. The venotomy incision was closed with Dermabond. Dressings were applied. The patient tolerated the procedure well without immediate post procedural complication. FINDINGS: After catheter placement, the tip lies within the superior cavoatrial junction. The catheter aspirates and flushes normally and is ready for immediate use. IMPRESSION: Successful placement of 25cm dual lumen tunneled PICC catheter via the right internal jugular vein with tip terminating at the superior caval atrial junction. The catheter is ready for immediate use. Electronically Signed   By: Jerilynn Mages.  Shick M.D.   On: 02/05/2020 16:09    Anti-infectives: Anti-infectives (From admission, onward)   Start     Dose/Rate Route Frequency Ordered Stop   02/04/20 2000  DAPTOmycin (CUBICIN) 600 mg in sodium chloride 0.9 % IVPB        600 mg 224 mL/hr over 30 Minutes Intravenous Every 48 hours 02/03/20 1426     02/02/20 1430  DAPTOmycin (CUBICIN) 500 mg in sodium chloride 0.9 % IVPB  Status:  Discontinued  500 mg 220 mL/hr over 30 Minutes Intravenous Every 48 hours 02/02/20 1332 02/03/20 1426   02/01/20 0800  meropenem (MERREM) 500 mg in sodium chloride 0.9 % 100 mL IVPB  Status:  Discontinued        500 mg 200 mL/hr over 30 Minutes Intravenous Every 12 hours 02/01/20 0656 02/02/20 1304   02/01/20 0655  vancomycin variable dose per unstable renal function  (pharmacist dosing)  Status:  Discontinued         Does not apply See admin instructions 02/01/20 0656 02/02/20 1305   02/01/20 0300  ceFEPIme (MAXIPIME) 2 g in sodium chloride 0.9 % 100 mL IVPB        2 g 200 mL/hr over 30 Minutes Intravenous  Once 02/01/20 0252 02/01/20 0446   02/01/20 0300  vancomycin (VANCOREADY) IVPB 1500 mg/300 mL        1,500 mg 150 mL/hr over 120 Minutes Intravenous  Once 02/01/20 0252 02/01/20 0624       Assessment/Plan DM CKD/AKI- Creatinine -3.84, stable Anemia-Hgb - 8.4 - 02/02/2020 HTN MRSA bacteremia  POD6, s/pIRRIGATION AND DEBRIDEMENT ABSCESSof multiple buttocks abscesses - 8/14 - [place] -cxs revealMRSA. abx per ID -WBC stable at10K -PT hydrotherapyto help with necrotic tissue -santyl to wound edges and bases -slowly improving, will decrease dressing changes to BID at this point -will watch this area at the inferior aspect to see how it progresses  FEN -carb mod VTE -heparin ID -cubicin   LOS: 6 days    Henreitta Cea , [facility] 02/07/2020, 11:45 AM Please see Amion for pager number during day hours 7:00am-4:30pm or 7:00am -11:30am on weekends

## 2020-02-07 NOTE — Progress Notes (Signed)
[facility]  PROGRESS NOTE  Peter [name] BZM:[id number] DOB: August 03, 1960 DOA: 01/31/2020 PCP: Raelene Bott, MD Admit date - 01/31/2020   Admitting Physician Vernelle Emerald, MD  Outpatient Primary MD for the patient is Raelene Bott, MD  LOS - 6 Brief Narrative  Peter [name] is a 59 y.o. year old male with medical history significant for uncontrolled type 2 diabetes, diabetic nephropathy, HTN, HLD, CKD stage IV who presented on 8/13/21subjective chills, generalized weakness, nausea and vomiting in multiple draining ulcers of the buttocks for the past week and was found to have leukocytosis, AKI with creatinine of 6, and CT imaging consistent with gluteal fluid collections concerning for abscesses that required debridement by Dr. Lucia Gaskins with surgery on 8/14.  Patient was found to have MRSA bacteremia as well as staph aureus growing from cultures from the abscess.  Empiric vancomycin and meropenem were changed to daptomycin due to acute on chronic kidney disease and culture data with ID consultants assistance.  Patient underwent TTE and TEE which were negative for any signs of endocarditis.  Subjective  Today has no acute complaints.  Tolerating hydrotherapy with PT A & P   Deep tissue infection of buttocks with multiple abscesses complicated by MRSA bacteremia, stable.  Status post I&D on 8/14 by Dr. Lucia Gaskins.  Remains afebrile, white count stable at 11,000.  Patient still requiring daily hydrotherapy to help with necrotic tissue -Continue daptomycin x4 weeks per ID, outpatient follow-up arranged -Daily PT hydrotherapy for necrotic tissue management, decrease to twice daily daily dressing changes, Santyl to wound edges and bases,  not ready for discharge yet per surgery -As needed pain control with oxycodone  AKI on CKD stage IV and hyperkalemia, slightly worsening peak creatinine of 6 on admission likely related to infectious presentation.  Creatinine currently 3.9, previous  baseline of 4.4 (per care everywhere).  Still maintaining adequate output, no acute indication for dialysis.  Potassium of 5.3 today, renal ultrasound consistent with chronic medical renal disease, also mild left hydronephrosis unchanged from prior CT last month -Outpatient nephrology referral placed -Avoid nephrotoxins, monitor BMP and output -Change to renal diet, low potassium -Status post Lokelma x1, repeat BMP this afternoon  Normocytic anemia, slightly worsening on admission hemoglobin 8.8-9.2.  Currently 7.6 with no signs or symptoms of bleeding -Monitor CBC  Type 2 diabetes, A1c 7.4 -Holding home loperamide -Discontinued Lantus started here for relative hypoglycemia (80, 8/18), fasting blood sugar 110 this a.m. -Continue sliding scale as needed, monitor CBGs  HTN, blood pressure elevated with SBP's in the 160s-170s -Increase amlodipine 10 mg 1 8/19 -Add hydralazine 25 mg 3 times daily -Continue Lopressor 50 mg twice daily  Family Communication  : None  Code Status : Full  Disposition Plan  :  Patient is from home. Anticipated d/c date: 2 to 3 days. Barriers to d/c or necessity for inpatient status: Still requiring intensive wound care with daily PT hydrotherapy, need to make sure no recurrent need for surgical intervention , as well as close monitoring of hyperkalemia Consults  : Surgery, ID  Procedures  : I&D on 8/14 by Dr. Lucia Gaskins  DVT Prophylaxis  : Heparin Lab Results  Component Value Date   PLT 351 02/07/2020    Diet :  Diet Order            Diet renal/carb modified with fluid restriction Diet-HS Snack? Nothing; Fluid restriction: 1200 mL Fluid; Room service appropriate? Yes; Fluid consistency: Thin  Diet effective now  Inpatient Medications Scheduled Meds: . amLODipine  10 mg Oral Daily  . collagenase   Topical TID  . heparin injection (subcutaneous)  5,000 Units Subcutaneous Q8H  . insulin aspart  0-15 Units Subcutaneous TID AC & HS  .  metoprolol tartrate  50 mg Oral BID   Continuous Infusions: . DAPTOmycin (CUBICIN)  IV Stopped (02/06/20 2034)   PRN Meds:.acetaminophen **OR** acetaminophen, ondansetron **OR** ondansetron (ZOFRAN) IV, oxyCODONE-acetaminophen **OR** oxyCODONE-acetaminophen, polyethylene glycol, silver nitrate applicators  Antibiotics  :   Anti-infectives (From admission, onward)   Start     Dose/Rate Route Frequency Ordered Stop   02/04/20 2000  DAPTOmycin (CUBICIN) 600 mg in sodium chloride 0.9 % IVPB        600 mg 224 mL/hr over 30 Minutes Intravenous Every 48 hours 02/03/20 1426     02/02/20 1430  DAPTOmycin (CUBICIN) 500 mg in sodium chloride 0.9 % IVPB  Status:  Discontinued        500 mg 220 mL/hr over 30 Minutes Intravenous Every 48 hours 02/02/20 1332 02/03/20 1426   02/01/20 0800  meropenem (MERREM) 500 mg in sodium chloride 0.9 % 100 mL IVPB  Status:  Discontinued        500 mg 200 mL/hr over 30 Minutes Intravenous Every 12 hours 02/01/20 0656 02/02/20 1304   02/01/20 0655  vancomycin variable dose per unstable renal function (pharmacist dosing)  Status:  Discontinued         Does not apply See admin instructions 02/01/20 0656 02/02/20 1305   02/01/20 0300  ceFEPIme (MAXIPIME) 2 g in sodium chloride 0.9 % 100 mL IVPB        2 g 200 mL/hr over 30 Minutes Intravenous  Once 02/01/20 0252 02/01/20 0446   02/01/20 0300  vancomycin (VANCOREADY) IVPB 1500 mg/300 mL        1,500 mg 150 mL/hr over 120 Minutes Intravenous  Once 02/01/20 0252 02/01/20 0624       Objective   Vitals:   02/06/20 0626 02/06/20 1002 02/06/20 2104 02/07/20 0540  BP: (!) 152/74 (!) 151/77 (!) 177/75 (!) 158/67  Pulse: 79 85 60 63  Resp: '20 16 16 15  ' Temp: 98.2 F (36.8 C)  97.6 F (36.4 C) 98.2 F (36.8 C)  TempSrc:   Oral Oral  SpO2: 97% 99% 100% 98%  Weight:      Height:        SpO2: 98 % O2 Flow Rate (L/min): 2 L/min  Wt Readings from Last 3 Encounters:  02/04/20 72.3 kg  11/20/19 75.3 kg  05/10/17  83.9 kg     Intake/Output Summary (Last 24 hours) at 02/07/2020 1449 Last data filed at 02/07/2020 1338 Gross per 24 hour  Intake 892 ml  Output 2075 ml  Net -1183 ml    Physical Exam:     Awake Alert, Oriented X 3, Normal affect No new F.N deficits,  [place].AT, Normal respiratory effort on room air, CTAB RRR,No Gallops,Rubs or new Murmurs,  +ve B.Sounds, Abd Soft, No tenderness, No rebound, guarding or rigidity.   I have personally reviewed the following:   Data Reviewed:  CBC Recent Labs  Lab 02/01/20 0831 02/01/20 0831 02/02/20 0225 02/04/20 0319 02/05/20 0959 02/06/20 0354 02/07/20 0337  WBC 30.5*   < > 21.4* 11.1* 10.8* 11.0* 10.1  HGB 8.8*   < > 8.4* 7.7* 7.6* 7.6* 7.6*  HCT 28.3*   < > 25.8* 23.5* 24.1* 24.0* 24.4*  PLT 328   < > 340 353  348 360 351  MCV 94.0   < > 90.8 90.4 94.1 93.4 93.8  MCH 29.2   < > 29.6 29.6 29.7 29.6 29.2  MCHC 31.1   < > 32.6 32.8 31.5 31.7 31.1  RDW 12.9   < > 12.9 12.6 12.4 12.2 12.1  LYMPHSABS 0.4*  --  0.4*  --  1.8 2.1 2.2  MONOABS 3.0*  --  1.8*  --  0.8 0.8 0.7  EOSABS 0.0  --  0.2  --  0.2 0.3 0.2  BASOSABS 0.1  --  0.0  --  0.1 0.0 0.0   < > = values in this interval not displayed.    Chemistries  Recent Labs  Lab 01/31/20 1608 01/31/20 1608 02/01/20 0831 02/01/20 0831 02/02/20 0225 02/02/20 0225 02/03/20 0402 02/04/20 1240 02/05/20 0959 02/06/20 0354 02/07/20 0337  NA 132*   < > 132*   < > 136   < > 139 142 140 139 135  K 4.7   < > 5.6*   < > 4.5   < > 4.0 4.0 4.3 4.4 5.3*  CL 104   < > 103   < > 106   < > 106 102 100 100 101  CO2 13*   < > 12*   < > 17*   < > 21* 30 32 29 26  GLUCOSE 224*   < > 259*   < > 156*   < > 180* 204* 162* 103* 132*  BUN 80*   < > 99*   < > 96*   < > 80* 67* 57* 58* 57*  CREATININE 6.00*   < > 6.93*   < > 6.28*   < > 5.24* 4.19* 3.82* 3.84* 3.99*  CALCIUM 8.2*   < > 8.0*   < > 7.9*   < > 7.7* 7.6* 7.8* 7.9* 7.9*  MG  --   --   --   --  1.5*  --   --   --   --   --   --   AST 27   --  29  --  26  --   --   --   --   --   --   ALT 29  --  28  --  29  --   --   --   --   --   --   ALKPHOS 90  --  87  --  84  --   --   --   --   --   --   BILITOT 0.7  --  0.8  --  0.4  --   --   --   --   --   --    < > = values in this interval not displayed.   ------------------------------------------------------------------------------------------------------------------ No results for input(s): CHOL, HDL, LDLCALC, TRIG, CHOLHDL, LDLDIRECT in the last 72 hours.  Lab Results  Component Value Date   HGBA1C 7.4 (H) 02/01/2020   ------------------------------------------------------------------------------------------------------------------ No results for input(s): TSH, T4TOTAL, T3FREE, THYROIDAB in the last 72 hours.  Invalid input(s): FREET3 ------------------------------------------------------------------------------------------------------------------ No results for input(s): VITAMINB12, FOLATE, FERRITIN, TIBC, IRON, RETICCTPCT in the last 72 hours.  Coagulation profile Recent Labs  Lab 02/01/20 0304  INR 1.3*    No results for input(s): DDIMER in the last 72 hours.  Cardiac Enzymes No results for input(s): CKMB, TROPONINI, MYOGLOBIN in the last 168 hours.  Invalid input(s): CK ------------------------------------------------------------------------------------------------------------------ No results found for: BNP  Micro Results Recent Results (from the past 240 hour(s))  Blood Culture (routine x 2)     Status: None   Collection Time: 02/01/20  3:04 AM   Specimen: BLOOD RIGHT FOREARM  Result Value Ref Range Status   Specimen Description BLOOD RIGHT FOREARM  Final   Special Requests   Final    BOTTLES DRAWN AEROBIC AND ANAEROBIC Blood Culture adequate volume   Culture   Final    NO GROWTH 5 DAYS Performed at [facility], [address] [place], [place] [address]    Report Status 02/06/2020 FINAL  Final  SARS Coronavirus 2 by RT PCR (hospital order,  performed in [facility] lab)     Status: None   Collection Time: 02/01/20  3:04 AM  Result Value Ref Range Status   SARS Coronavirus 2 NEGATIVE NEGATIVE Final    Comment: (NOTE) SARS-CoV-2 target nucleic acids are NOT DETECTED.  The SARS-CoV-2 RNA is generally detectable in upper and lower respiratory specimens during the acute phase of infection. The lowest concentration of SARS-CoV-2 viral copies this assay can detect is 250 copies / mL. A negative result does not preclude SARS-CoV-2 infection and should not be used as the sole basis for treatment or other patient management decisions.  A negative result may occur with improper specimen collection / handling, submission of specimen other than nasopharyngeal swab, presence of viral mutation(s) within the areas targeted by this assay, and inadequate number of viral copies (<250 copies / mL). A negative result must be combined with clinical observations, patient history, and epidemiological information.  Fact Sheet for Patients:   StrictlyIdeas.no  Fact Sheet for Healthcare Providers: BankingDealers.co.za  This test is not yet approved or  cleared by the [place] FDA and has been authorized for detection and/or diagnosis of SARS-CoV-2 by FDA under an Emergency Use Authorization (EUA).  This EUA will remain in effect (meaning this test can be used) for the duration of the COVID-19 declaration under Section 564(b)(1) of the Act, 21 U.S.C. section 360bbb-3(b)(1), unless the authorization is terminated or revoked sooner.  Performed at [facility], [place] [address], [place], [place] [address]   Wound or Superficial Culture     Status: None   Collection Time: 02/01/20  4:13 AM   Specimen: Buttocks; Wound  Result Value Ref Range Status   Specimen Description BUTTOCKS  Final   Special Requests NONE  Final   Gram Stain   Final    NO WBC SEEN ABUNDANT GRAM POSITIVE  COCCI RARE GRAM NEGATIVE RODS RARE GRAM POSITIVE RODS Performed at [facility], [address], [place],  [address]    Culture   Final    ABUNDANT METHICILLIN RESISTANT STAPHYLOCOCCUS AUREUS   Report Status 02/03/2020 FINAL  Final   Organism ID, Bacteria METHICILLIN RESISTANT STAPHYLOCOCCUS AUREUS  Final      Susceptibility   Methicillin resistant staphylococcus aureus - MIC*    CIPROFLOXACIN >=8 RESISTANT Resistant     ERYTHROMYCIN >=8 RESISTANT Resistant     GENTAMICIN <=0.5 SENSITIVE Sensitive     OXACILLIN >=4 RESISTANT Resistant     TETRACYCLINE <=1 SENSITIVE Sensitive     VANCOMYCIN 1 SENSITIVE Sensitive     TRIMETH/SULFA <=10 SENSITIVE Sensitive     CLINDAMYCIN <=0.25 SENSITIVE Sensitive     RIFAMPIN <=0.5 SENSITIVE Sensitive     Inducible Clindamycin NEGATIVE Sensitive     * ABUNDANT METHICILLIN RESISTANT STAPHYLOCOCCUS AUREUS  Blood Culture (routine x 2)  Status: Abnormal   Collection Time: 02/01/20  4:48 AM   Specimen: BLOOD  Result Value Ref Range Status   Specimen Description BLOOD RIGHT ANTECUBITAL  Final   Special Requests   Final    BOTTLES DRAWN AEROBIC AND ANAEROBIC Blood Culture adequate volume   Culture  Setup Time   Final    GRAM POSITIVE COCCI IN CLUSTERS IN BOTH AEROBIC AND ANAEROBIC BOTTLES CRITICAL RESULT CALLED TO, READ BACK BY AND VERIFIED WITH: J. LEDFORD,PHARMD 0018 02/02/2020 Mena Goes Performed at [facility], [place] [address], [place], [place] [address]    Culture METHICILLIN RESISTANT STAPHYLOCOCCUS AUREUS (A)  Final   Report Status 02/03/2020 FINAL  Final   Organism ID, Bacteria METHICILLIN RESISTANT STAPHYLOCOCCUS AUREUS  Final      Susceptibility   Methicillin resistant staphylococcus aureus - MIC*    CIPROFLOXACIN >=8 RESISTANT Resistant     ERYTHROMYCIN >=8 RESISTANT Resistant     GENTAMICIN <=0.5 SENSITIVE Sensitive     OXACILLIN >=4 RESISTANT Resistant     TETRACYCLINE <=1 SENSITIVE Sensitive     VANCOMYCIN 1  SENSITIVE Sensitive     TRIMETH/SULFA <=10 SENSITIVE Sensitive     CLINDAMYCIN <=0.25 SENSITIVE Sensitive     RIFAMPIN <=0.5 SENSITIVE Sensitive     Inducible Clindamycin NEGATIVE Sensitive     * METHICILLIN RESISTANT STAPHYLOCOCCUS AUREUS  Blood Culture ID Panel (Reflexed)     Status: Abnormal   Collection Time: 02/01/20  4:48 AM  Result Value Ref Range Status   Enterococcus faecalis NOT DETECTED NOT DETECTED Final   Enterococcus Faecium NOT DETECTED NOT DETECTED Final   Listeria monocytogenes NOT DETECTED NOT DETECTED Final   Staphylococcus species DETECTED (A) NOT DETECTED Final    Comment: CRITICAL RESULT CALLED TO, READ BACK BY AND VERIFIED WITH: J. LEDFORD,PHARMD 0018 02/02/2020 T. TYSOR    Staphylococcus aureus (BCID) DETECTED (A) NOT DETECTED Final    Comment: Methicillin (oxacillin)-resistant Staphylococcus aureus (MRSA). MRSA is predictably resistant to beta-lactam antibiotics (except ceftaroline). Preferred therapy is vancomycin unless clinically contraindicated. Patient requires contact precautions if  hospitalized. CRITICAL RESULT CALLED TO, READ BACK BY AND VERIFIED WITH: J. LEDFORD,PHARMD 0018 02/02/2020 T. TYSOR    Staphylococcus epidermidis NOT DETECTED NOT DETECTED Final   Staphylococcus lugdunensis NOT DETECTED NOT DETECTED Final   Streptococcus species NOT DETECTED NOT DETECTED Final   Streptococcus agalactiae NOT DETECTED NOT DETECTED Final   Streptococcus pneumoniae NOT DETECTED NOT DETECTED Final   Streptococcus pyogenes NOT DETECTED NOT DETECTED Final   A.calcoaceticus-baumannii NOT DETECTED NOT DETECTED Final   Bacteroides fragilis NOT DETECTED NOT DETECTED Final   Enterobacterales NOT DETECTED NOT DETECTED Final   Enterobacter cloacae complex NOT DETECTED NOT DETECTED Final   Escherichia coli NOT DETECTED NOT DETECTED Final   Klebsiella aerogenes NOT DETECTED NOT DETECTED Final   Klebsiella oxytoca NOT DETECTED NOT DETECTED Final   Klebsiella pneumoniae  NOT DETECTED NOT DETECTED Final   Proteus species NOT DETECTED NOT DETECTED Final   Salmonella species NOT DETECTED NOT DETECTED Final   Serratia marcescens NOT DETECTED NOT DETECTED Final   Haemophilus influenzae NOT DETECTED NOT DETECTED Final   Neisseria meningitidis NOT DETECTED NOT DETECTED Final   Pseudomonas aeruginosa NOT DETECTED NOT DETECTED Final   Stenotrophomonas maltophilia NOT DETECTED NOT DETECTED Final   Candida albicans NOT DETECTED NOT DETECTED Final   Candida auris NOT DETECTED NOT DETECTED Final   Candida glabrata NOT DETECTED NOT DETECTED Final   Candida krusei NOT DETECTED NOT DETECTED Final  Candida parapsilosis NOT DETECTED NOT DETECTED Final   Candida tropicalis NOT DETECTED NOT DETECTED Final   Cryptococcus neoformans/gattii NOT DETECTED NOT DETECTED Final   Meth resistant mecA/C and MREJ DETECTED (A) NOT DETECTED Final    Comment: CRITICAL RESULT CALLED TO, READ BACK BY AND VERIFIED WITH: J. LEDFORD,PHARMD 0018 02/02/2020 Mena Goes Performed at [facility], [place] [address] [place], [place] [address]   Aerobic/Anaerobic Culture (surgical/deep wound)     Status: None   Collection Time: 02/01/20 11:31 AM   Specimen: Buttocks; Abscess  Result Value Ref Range Status   Specimen Description ABSCESS  Final   Special Requests BUTTOCKS  Final   Gram Stain   Final    RARE WBC PRESENT, PREDOMINANTLY PMN FEW GRAM POSITIVE COCCI    Culture   Final    MODERATE METHICILLIN RESISTANT STAPHYLOCOCCUS AUREUS NO ANAEROBES ISOLATED Performed at [facility], [address] [place], [place] [address]    Report Status 02/06/2020 FINAL  Final   Organism ID, Bacteria METHICILLIN RESISTANT STAPHYLOCOCCUS AUREUS  Final      Susceptibility   Methicillin resistant staphylococcus aureus - MIC*    CIPROFLOXACIN >=8 RESISTANT Resistant     ERYTHROMYCIN >=8 RESISTANT Resistant     GENTAMICIN <=0.5 SENSITIVE Sensitive     OXACILLIN >=4 RESISTANT Resistant      TETRACYCLINE <=1 SENSITIVE Sensitive     VANCOMYCIN 1 SENSITIVE Sensitive     TRIMETH/SULFA <=10 SENSITIVE Sensitive     CLINDAMYCIN <=0.25 SENSITIVE Sensitive     RIFAMPIN <=0.5 SENSITIVE Sensitive     Inducible Clindamycin NEGATIVE Sensitive     * MODERATE METHICILLIN RESISTANT STAPHYLOCOCCUS AUREUS  Urine culture     Status: Abnormal (Preliminary result)   Collection Time: 02/02/20  5:05 AM   Specimen: In/Out Cath Urine  Result Value Ref Range Status   Specimen Description IN/OUT CATH URINE  Final   Special Requests NONE  Final   Culture (A)  Final    400 COLONIES/mL GRAM NEGATIVE RODS Sent to [facility] for further susceptibility testing. AND IDENTIFICATION Performed at [facility], [place] [address], [place], [place] [address]    Report Status PENDING  Incomplete  MRSA PCR Screening     Status: None   Collection Time: 02/02/20  5:16 AM   Specimen: Nasal Mucosa; Nasopharyngeal  Result Value Ref Range Status   MRSA by PCR NEGATIVE NEGATIVE Final    Comment:        The GeneXpert MRSA Assay (FDA approved for NASAL specimens only), is one component of a comprehensive MRSA colonization surveillance program. It is not intended to diagnose MRSA infection nor to guide or monitor treatment for MRSA infections. Performed at [facility], [place] [address], [place], [place] [address]   Culture, blood (routine x 2)     Status: None (Preliminary result)   Collection Time: 02/03/20  4:02 AM   Specimen: BLOOD  Result Value Ref Range Status   Specimen Description BLOOD SITE NOT SPECIFIED  Final   Special Requests   Final    BOTTLES DRAWN AEROBIC ONLY Blood Culture results may not be optimal due to an inadequate volume of blood received in culture bottles   Culture   Final    NO GROWTH 4 DAYS Performed at [facility], [place] [address] [place], [place] [address]    Report Status PENDING  Incomplete  Culture, blood (routine x 2)     Status: None (Preliminary  result)    Collection Time: 02/03/20  4:13 AM   Specimen: BLOOD  Result Value Ref Range Status   Specimen Description BLOOD SITE NOT SPECIFIED  Final   Special Requests   Final    BOTTLES DRAWN AEROBIC ONLY Blood Culture results may not be optimal due to an inadequate volume of blood received in culture bottles   Culture   Final    NO GROWTH 4 DAYS Performed at [facility], [place] [address] [place], [place] [address]    Report Status PENDING  Incomplete    Radiology Reports CT ABDOMEN PELVIS WO CONTRAST  Result Date: 02/01/2020 CLINICAL DATA:  Abdominal abscess/infection suspected Nausea. Patient reports decreased appetite. Patient reports buttock abscess. EXAM: CT ABDOMEN AND PELVIS WITHOUT CONTRAST TECHNIQUE: Multidetector CT imaging of the abdomen and pelvis was performed following the standard protocol without IV contrast. COMPARISON:  CT 03/06/2017 FINDINGS: Lower chest: Minimal subpleural scarring in the left lower lobe. No acute airspace disease. No pleural effusion. Hepatobiliary: No focal hepatic abnormality on noncontrast exam. Multiple calcified gallstones without pericholecystic inflammation. There is no biliary dilatation. Pancreas: No ductal dilatation or inflammation. Spleen: Normal in size. No focal abnormality on noncontrast exam. Coils again seen in the splenic artery. Adrenals/Urinary Tract: Previous left adrenal hematoma has resolved, there is mild residual adrenal thickening. Normal right adrenal gland. Mild left hydronephrosis and hydroureter to the level of the distal pelvis. No stone or cause for obstruction. Mild prominence of the right ureter without hydronephrosis. There is symmetric bilateral perinephric edema. Urinary bladder is minimally distended. Stomach/Bowel: Small hiatal hernia. Stomach otherwise unremarkable. There is no bowel obstruction. No evidence of bowel wall thickening or inflammation. Few lower pelvic small bowel loops are fluid-filled but nondilated or  inflamed. Normal appendix. High-riding cecum. Moderate volume of colonic stool. There is colonic tortuosity. No colonic wall thickening or inflammation. Vascular/Lymphatic: Abdominal aorta is normal in caliber. Coiling of the splenic artery. Multiple small retroperitoneal lymph nodes, not enlarged by size criteria and likely reactive. Small bilateral inguinal nodes. Reproductive: Prostate unremarkable. Right testis may be within the inguinal canal, series 3, image 86. Other: No ascites or free air. Subcutaneous fluid collection involving the left gluteal soft tissues about the gluteal crease measuring 2.8 x 1.4 x 4.1 cm. Mild adjacent soft tissue edema tracks into the gluteal fold. Fluid collection in the inferior right gluteal soft tissues abutting the gluteal crease measures 3.0 x 2.4 x 3.8 cm. There is no air within either fluid collection or subcutaneous soft tissues. No extension into the anorectum. Musculoskeletal: Bilateral sacroiliac joint pinning and pubic symphyseal fixation. Remote left transverse process fractures in the lumbar spine. Remote left rib fractures. No acute osseous abnormalities. IMPRESSION: 1. Two gluteal fluid collections, 1 on the right and 1 on the left. Subcutaneous fluid collection involving the left gluteal soft tissues abut the gluteal crease measuring 2.8 x 1.4 x 4.1 cm, suspicious for abscess. Fluid collection in the inferior right gluteal soft tissues abutting the gluteal crease measures 3.0 x 2.4 x 3.8 cm. No air within either fluid collection or subcutaneous soft tissues. 2. Mild left hydronephrosis and hydroureter to the level of the distal pelvis. No stone or cause for obstruction. Bilateral perinephric edema, nonspecific. Decompressed urinary bladder which appears thick walled. 3. Cholelithiasis without gallbladder inflammation. 4. Small hiatal hernia. 5. Right testis may be within the inguinal canal. Recommend correlation with physical exam. Electronically Signed   By:  Keith Rake M.D.   On: 02/01/2020 03:39  US RENAL  Result Date: 02/03/2020 CLINICAL DATA:  Acute kidney injury. EXAM: RENAL / URINARY TRACT ULTRASOUND COMPLETE COMPARISON:  CT 02/01/2020, renal ultrasound 01/03/2020 FINDINGS: Right Kidney: Renal measurements: 11.7 x 5.6 x 5.3 cm = volume: 180 mL. Mild increased renal parenchymal echogenicity. No mass or hydronephrosis visualized. Left Kidney: Renal measurements: 10.1 x 5.3 x 5.2 cm = volume: 146 mL. Mild hydronephrosis. Renal pelvis measures 12 mm in dimension. Mild increased renal echogenicity. No evidence of focal lesion. Bladder: Appears normal for degree of bladder distention. Other: Incidental gallstones. IMPRESSION: 1. Mild left hydronephrosis. This is unchanged from prior CT as well as renal ultrasound last month. Cause for obstruction of stab list. 2. Mild increased renal echogenicity consistent with chronic medical renal disease. Electronically Signed   By: Keith Rake M.D.   On: 02/03/2020 23:07   IR Fluoro Guide CV Line Right  Result Date: 02/05/2020 INDICATION: MRSA bacteremia, cellulitis, access for long-term antibiotics EXAM: TUNNELED PICC LINE WITH ULTRASOUND AND FLUOROSCOPIC GUIDANCE MEDICATIONS: 1% lidocaine local. ANESTHESIA/SEDATION: Moderate Sedation Time:  None. The patient was continuously monitored during the procedure by the interventional radiology nurse under my direct supervision. FLUOROSCOPY TIME:  Fluoroscopy Time: 0 minutes 6 seconds (1 mGy). COMPLICATIONS: None immediate. PROCEDURE: Informed written consent was obtained from the patient after a discussion of the risks, benefits, and alternatives to treatment. Questions regarding the procedure were encouraged and answered. The right neck and chest were prepped with chlorhexidine in a sterile fashion, and a sterile drape was applied covering the operative field. Maximum barrier sterile technique with sterile gowns and gloves were used for the procedure. A timeout was  performed prior to the initiation of the procedure. After creating a small venotomy incision, a micropuncture kit was utilized to access the right internal jugular vein under direct, real-time ultrasound guidance after the overlying soft tissues were anesthetized with 1% lidocaine with epinephrine. Ultrasound image documentation was performed. The microwire was kinked to measure appropriate catheter length. The micropuncture sheath was exchanged for a peel-away sheath over a guidewire. A 5 French dual lumen tunneled PICC measuring 25 cm was tunneled in a retrograde fashion from the anterior chest wall to the venotomy incision. The catheter was then placed through the peel-away sheath with tip ultimately positioned at the superior caval-atrial junction. Final catheter positioning was confirmed and documented with a spot radiographic image. The catheter aspirates and flushes normally. The catheter was flushed with appropriate volume heparin dwells. The catheter exit site was secured with a 3 0 Ethilon retention suture. The venotomy incision was closed with Dermabond. Dressings were applied. The patient tolerated the procedure well without immediate post procedural complication. FINDINGS: After catheter placement, the tip lies within the superior cavoatrial junction. The catheter aspirates and flushes normally and is ready for immediate use. IMPRESSION: Successful placement of 25cm dual lumen tunneled PICC catheter via the right internal jugular vein with tip terminating at the superior caval atrial junction. The catheter is ready for immediate use. Electronically Signed   By: Jerilynn Mages.  Shick M.D.   On: 02/05/2020 16:09   IR US Guide Vasc Access Right  Result Date: 02/05/2020 INDICATION: MRSA bacteremia, cellulitis, access for long-term antibiotics EXAM: TUNNELED PICC LINE WITH ULTRASOUND AND FLUOROSCOPIC GUIDANCE MEDICATIONS: 1% lidocaine local. ANESTHESIA/SEDATION: Moderate Sedation Time:  None. The patient was  continuously monitored during the procedure by the interventional radiology nurse under my direct supervision. FLUOROSCOPY TIME:  Fluoroscopy Time: 0 minutes 6 seconds (1 mGy). COMPLICATIONS: None immediate. PROCEDURE: Informed written consent  was obtained from the patient after a discussion of the risks, benefits, and alternatives to treatment. Questions regarding the procedure were encouraged and answered. The right neck and chest were prepped with chlorhexidine in a sterile fashion, and a sterile drape was applied covering the operative field. Maximum barrier sterile technique with sterile gowns and gloves were used for the procedure. A timeout was performed prior to the initiation of the procedure. After creating a small venotomy incision, a micropuncture kit was utilized to access the right internal jugular vein under direct, real-time ultrasound guidance after the overlying soft tissues were anesthetized with 1% lidocaine with epinephrine. Ultrasound image documentation was performed. The microwire was kinked to measure appropriate catheter length. The micropuncture sheath was exchanged for a peel-away sheath over a guidewire. A 5 French dual lumen tunneled PICC measuring 25 cm was tunneled in a retrograde fashion from the anterior chest wall to the venotomy incision. The catheter was then placed through the peel-away sheath with tip ultimately positioned at the superior caval-atrial junction. Final catheter positioning was confirmed and documented with a spot radiographic image. The catheter aspirates and flushes normally. The catheter was flushed with appropriate volume heparin dwells. The catheter exit site was secured with a 3 0 Ethilon retention suture. The venotomy incision was closed with Dermabond. Dressings were applied. The patient tolerated the procedure well without immediate post procedural complication. FINDINGS: After catheter placement, the tip lies within the superior cavoatrial junction. The  catheter aspirates and flushes normally and is ready for immediate use. IMPRESSION: Successful placement of 25cm dual lumen tunneled PICC catheter via the right internal jugular vein with tip terminating at the superior caval atrial junction. The catheter is ready for immediate use. Electronically Signed   By: Jerilynn Mages.  Shick M.D.   On: 02/05/2020 16:09   DG Chest Portable 1 View  Result Date: 02/01/2020 CLINICAL DATA:  Cough. EXAM: PORTABLE CHEST 1 VIEW COMPARISON:  Radiograph 04/01/2017 FINDINGS: Heart is normal in size. Normal mediastinal contours. Remote left rib fractures with minor scarring at the left lung base. No acute or confluent airspace disease. No pleural fluid or pneumothorax. No pulmonary edema. Left upper quadrant vascular coils. IMPRESSION: 1. No acute abnormality. 2. Remote left rib fractures with minor scarring at the left lung base. Electronically Signed   By: Keith Rake M.D.   On: 02/01/2020 03:41   ECHOCARDIOGRAM COMPLETE  Result Date: 02/02/2020    ECHOCARDIOGRAM REPORT   Patient Name:   IVON OELKERS Date of Exam: 02/02/2020 Medical Rec #:  [id number]        Height:       70.0 in Accession #:    [id number]       Weight:       159.4 lb Date of Birth:  [birth year]       BSA:          1.895 m Patient Age:    5 years         BP:           142/84 mmHg Patient Gender: M                HR:           84 bpm. Exam Location:  Inpatient Procedure: 2D Echo Indications:    bacteremia  History:        Patient has no prior history of Echocardiogram examinations.                 Risk  Factors:Diabetes and Hypertension.  Sonographer:    Jannett Celestine RDCS (AE) Referring Phys: [place]  1. There is a linear independently mobile echodensity in the LVOT (see image 30). This cannot be clearly traced back to a structure, but aortic valve endocarditis not excluded. Left ventricular ejection fraction, by estimation, is 65 to 70%. The left ventricle has normal function. The left  ventricle has no regional wall motion abnormalities. Left ventricular diastolic parameters were normal.  2. Right ventricular systolic function is normal. The right ventricular size is normal. Tricuspid regurgitation signal is inadequate for assessing PA pressure.  3. The mitral valve is normal in structure. Trivial mitral valve regurgitation. No evidence of mitral stenosis.  4. Bicuspid aortic valve, with fusion of the RCC and LCC (image 20) with focal calcification. Mild AI. Marland Kitchen The aortic valve is bicuspid. Aortic valve regurgitation is mild. Mild aortic valve sclerosis is present, with no evidence of aortic valve stenosis.  5. The inferior vena cava is normal in size with greater than 50% respiratory variability, suggesting right atrial pressure of 3 mmHg. Comparison(s): No prior Echocardiogram. Conclusion(s)/Recommendation(s): Bicuspid aortic valve. There is an independently mobile linear echodensity seen in the LVOT. Given bacteremia, would recommend TEE to further evaluate for endocarditis. FINDINGS  Left Ventricle: There is a linear independently mobile echodensity in the LVOT (see image 30). This cannot be clearly traced back to a structure, but aortic valve endocarditis not excluded. Left ventricular ejection fraction, by estimation, is 65 to 70%. The left ventricle has normal function. The left ventricle has no regional wall motion abnormalities. The left ventricular internal cavity size was normal in size. There is borderline left ventricular hypertrophy. Left ventricular diastolic parameters were normal. Right Ventricle: The right ventricular size is normal. No increase in right ventricular wall thickness. Right ventricular systolic function is normal. Tricuspid regurgitation signal is inadequate for assessing PA pressure. Left Atrium: Left atrial size was normal in size. Right Atrium: Right atrial size was normal in size. Pericardium: There is no evidence of pericardial effusion. Mitral Valve: The mitral  valve is normal in structure. Trivial mitral valve regurgitation. No evidence of mitral valve stenosis. Tricuspid Valve: The tricuspid valve is normal in structure. Tricuspid valve regurgitation is trivial. No evidence of tricuspid stenosis. Aortic Valve: Bicuspid aortic valve, with fusion of the RCC and LCC (image 20) with focal calcification. Mild AI. The aortic valve is bicuspid. . There is mild thickening and mild calcification of the aortic valve. Aortic valve regurgitation is mild. Mild aortic valve sclerosis is present, with no evidence of aortic valve stenosis. There is mild thickening of the aortic valve. There is mild calcification of the aortic valve. Pulmonic Valve: The pulmonic valve was grossly normal. Pulmonic valve regurgitation is trivial. Aorta: The aortic root was not well visualized, the ascending aorta was not well visualized and the aortic arch was not well visualized. Venous: The inferior vena cava is normal in size with greater than 50% respiratory variability, suggesting right atrial pressure of 3 mmHg. IAS/Shunts: The atrial septum is grossly normal.  LEFT VENTRICLE PLAX 2D LVIDd:         4.70 cm  Diastology LVIDs:         2.80 cm  LV e' lateral:   7.62 cm/s LV PW:         1.10 cm  LV E/e' lateral: 9.7 LV IVS:        1.20 cm  LV e' medial:    5.98 cm/s  LVOT diam:     2.20 cm  LV E/e' medial:  12.4 LV SV:         87 LV SV Index:   46 LVOT Area:     3.80 cm  RIGHT VENTRICLE RV S prime:     9.46 cm/s TAPSE (M-mode): 1.7 cm LEFT ATRIUM           Index       RIGHT ATRIUM          Index LA diam:      3.00 cm 1.58 cm/m  RA Area:     8.89 cm LA Vol (A2C): 20.6 ml 10.87 ml/m RA Volume:   15.00 ml 7.91 ml/m LA Vol (A4C): 21.9 ml 11.55 ml/m  AORTIC VALVE LVOT Vmax:   95.30 cm/s LVOT Vmean:  71.000 cm/s LVOT VTI:    0.229 m  AORTA Ao Root diam: 3.60 cm MITRAL VALVE MV Area (PHT): 3.27 cm     SHUNTS MV Decel Time: 232 msec     Systemic VTI:  0.23 m MV E velocity: 74.10 cm/s   Systemic Diam: 2.20  cm MV A velocity: 102.00 cm/s MV E/A ratio:  0.73 Buford Dresser MD Electronically signed by Buford Dresser MD Signature Date/Time: 02/02/2020/4:39:02 PM    Final    ECHO TEE  Result Date: 02/04/2020    TRANSESOPHOGEAL ECHO REPORT   Patient Name:   FLOYDE DINGLEY Date of Exam: 02/04/2020 Medical Rec #:  [id number]        Height:       70.0 in Accession #:    [id number]       Weight:       159.4 lb Date of Birth:  [birth year]       BSA:          1.895 m Patient Age:    52 years         BP:           156/81 mmHg Patient Gender: M                HR:           64 bpm. Exam Location:  Inpatient Procedure: Transesophageal Echo and Color Doppler Indications:     Bacteremia  History:         Patient has prior history of Echocardiogram examinations, most                  recent 02/02/2020. Risk Factors:Diabetes, Hypertension and                  Dyslipidemia. CKD.  Sonographer:     Clayton Lefort RDCS (AE) Referring Phys:  [id number] [place] Diagnosing Phys: Buford Dresser MD PROCEDURE: After discussion of the risks and benefits of a TEE, an informed consent was obtained from the patient. The transesophogeal probe was passed without difficulty through the esophogus of the patient. Local oropharyngeal anesthetic was provided with viscous lidocaine. Sedation performed by different physician. The patient was monitored while under deep sedation. Anesthestetic sedation was provided intravenously by Anesthesiology: 139.71m of Propofol. Image quality was good. The patient's vital signs; including heart rate, blood pressure, and oxygen saturation; remained stable throughout the procedure. The patient developed no complications during the procedure. IMPRESSIONS  1. Left ventricular ejection fraction, by estimation, is 65 to 70%. The left ventricle has normal function. The left ventricle has no regional wall motion abnormalities.  2. Right ventricular systolic function is normal. The  right ventricular size is  normal.  3. No left atrial/left atrial appendage thrombus was detected.  4. The mitral valve is normal in structure. Trivial mitral valve regurgitation. No evidence of mitral stenosis.  5. The aortic valve is bicuspid. Aortic valve regurgitation is trivial. No aortic stenosis is present.  6. There is mild (Grade II) plaque involving the descending aorta. Conclusion(s)/Recommendation(s): No evidence of endocarditis. Bicuspid aortic valve. Structure seen on TTE is associated with the mitral valve, not the aortic, near the mitral-aortic annular junction. This appears to be a chordae, not consistent with vegetation. FINDINGS  Left Ventricle: Left ventricular ejection fraction, by estimation, is 65 to 70%. The left ventricle has normal function. The left ventricle has no regional wall motion abnormalities. The left ventricular internal cavity size was normal in size. There is  borderline left ventricular hypertrophy. Right Ventricle: The right ventricular size is normal. No increase in right ventricular wall thickness. Right ventricular systolic function is normal. Left Atrium: Left atrial size was not assessed. No left atrial/left atrial appendage thrombus was detected. Right Atrium: Right atrial size was not assessed. Pericardium: There is no evidence of pericardial effusion. Mitral Valve: The mitral valve is normal in structure. Trivial mitral valve regurgitation. No evidence of mitral valve stenosis. Tricuspid Valve: The tricuspid valve is normal in structure. Tricuspid valve regurgitation is trivial. No evidence of tricuspid stenosis. Aortic Valve: The aortic valve is bicuspid. Aortic valve regurgitation is trivial. No aortic stenosis is present. Pulmonic Valve: The pulmonic valve was grossly normal. Pulmonic valve regurgitation is trivial. Aorta: The aortic root is normal in size and structure. There is mild (Grade II) plaque involving the descending aorta. IAS/Shunts: No atrial level shunt detected by color flow  Doppler. Buford Dresser MD Electronically signed by Buford Dresser MD Signature Date/Time: 02/04/2020/5:29:42 PM    Final      Time Spent in minutes  30     Desiree Hane M.D on 02/07/2020 at 2:49 PM  To page go to [email or web address] - password [facility]

## 2020-02-08 LAB — BASIC METABOLIC PANEL
Anion gap: 10 (ref 5–15)
BUN: 62 mg/dL — ABNORMAL HIGH (ref 6–20)
CO2: 25 mmol/L (ref 22–32)
Calcium: 8.2 mg/dL — ABNORMAL LOW (ref 8.9–10.3)
Chloride: 103 mmol/L (ref 98–111)
Creatinine, Ser: 3.91 mg/dL — ABNORMAL HIGH (ref 0.61–1.24)
GFR calc Af Amer: 18 mL/min — ABNORMAL LOW (ref >60)
GFR calc non Af Amer: 16 mL/min — ABNORMAL LOW (ref >60)
Glucose, Bld: 164 mg/dL — ABNORMAL HIGH (ref 70–99)
Potassium: 6.2 mmol/L — ABNORMAL HIGH (ref 3.5–5.1)
Sodium: 138 mmol/L (ref 135–145)

## 2020-02-08 LAB — POTASSIUM
Potassium: 5.2 mmol/L — ABNORMAL HIGH (ref 3.5–5.1)
Potassium: 5.2 mmol/L — ABNORMAL HIGH (ref 3.5–5.1)
Potassium: 5.7 mmol/L — ABNORMAL HIGH (ref 3.5–5.1)

## 2020-02-08 LAB — GLUCOSE, CAPILLARY
Glucose-Capillary: 147 mg/dL — ABNORMAL HIGH (ref 70–99)
Glucose-Capillary: 184 mg/dL — ABNORMAL HIGH (ref 70–99)
Glucose-Capillary: 156 mg/dL — ABNORMAL HIGH (ref 70–99)
Glucose-Capillary: 238 mg/dL — ABNORMAL HIGH (ref 70–99)

## 2020-02-08 LAB — CULTURE, BLOOD (ROUTINE X 2)
Culture: NO GROWTH
Culture: NO GROWTH

## 2020-02-08 LAB — NA AND K (SODIUM & POTASSIUM), RAND UR
Potassium Urine: 20 mmol/L
Sodium, Ur: 74 mmol/L

## 2020-02-08 MED ORDER — SODIUM ZIRCONIUM CYCLOSILICATE 10 G PO PACK
10.0000 g | PACK | Freq: Once | ORAL | Status: AC
  Administered 2020-02-08: 10 g via ORAL
  Filled 2020-02-08: qty 1

## 2020-02-08 MED ORDER — HYDRALAZINE HCL 25 MG PO TABS
25.0000 mg | ORAL_TABLET | Freq: Once | ORAL | Status: AC
  Administered 2020-02-08: 25 mg via ORAL
  Filled 2020-02-08: qty 1

## 2020-02-08 MED ORDER — SODIUM ZIRCONIUM CYCLOSILICATE 5 G PO PACK
5.0000 g | PACK | Freq: Once | ORAL | Status: DC
  Filled 2020-02-08: qty 1

## 2020-02-08 MED ORDER — HYDRALAZINE HCL 50 MG PO TABS
50.0000 mg | ORAL_TABLET | Freq: Three times a day (TID) | ORAL | Status: DC
  Administered 2020-02-08 – 2020-02-13 (×13): 50 mg via ORAL
  Filled 2020-02-08 (×17): qty 1

## 2020-02-08 NOTE — Progress Notes (Addendum)
[facility]  PROGRESS NOTE  Peter [name] BEM:[id number] DOB: Jul 05, 1960 DOA: 01/31/2020 PCP: Raelene Bott, MD Admit date - 01/31/2020   Admitting Physician Vernelle Emerald, MD  Outpatient Primary MD for the patient is Raelene Bott, MD  LOS - 7 Brief Narrative  Peter [name] is a 59 y.o. year old male with medical history significant for uncontrolled type 2 diabetes, diabetic nephropathy, HTN, HLD, CKD stage IV who presented on 8/13/21subjective chills, generalized weakness, nausea and vomiting in multiple draining ulcers of the buttocks for the past week and was found to have leukocytosis, AKI with creatinine of 6, and CT imaging consistent with gluteal fluid collections concerning for abscesses that required debridement by Dr. Lucia Gaskins with surgery on 8/14.  Patient was found to have MRSA bacteremia as well as staph aureus growing from cultures from the abscess.  Empiric vancomycin and meropenem were changed to daptomycin due to acute on chronic kidney disease and culture data with ID consultants assistance.  Patient underwent TTE and TEE which were negative for any signs of endocarditis.  Subjective  Today has no acute complaints.  Tolerating hydrotherapy with PT.  A & P   Deep tissue infection of buttocks with multiple abscesses complicated by MRSA bacteremia, stable.  Status post I&D on 8/14 by Dr. Lucia Gaskins.  Remains afebrile, white count stable at 11,000.  Patient still requiring daily hydrotherapy to help with necrotic tissue -Continue daptomycin x4 weeks per ID, outpatient follow-up arranged -Daily PT hydrotherapy for necrotic tissue management, decrease to twice daily daily dressing changes, Santyl to wound edges and bases,  not ready for discharge yet per surgery -As needed pain control with oxycodone  AKI on CKD stage IV and persistent moderate hyperkalemia, Now 6.6 despite lokelma yesterday evening.   Unclear etiology.   Creatinine currently 3.9, previous baseline of  4.4 (per care everywhere).  Still maintaining adequate output, no acute indication for dialysis.  renal ultrasound consistent with chronic medical renal disease, also mild left hydronephrosis unchanged from prior CT last month -Outpatient nephrology referral placed -Avoid nephrotoxins, monitor BMP and output -renal diet, low potassium -give lokelma 10 mg x 1, EKG , monitor K q4h, telemetry  Normocytic anemia, slightly worsening on admission hemoglobin 8.8-9.2.  Currently 7.6 with no signs or symptoms of bleeding -Monitor CBC  Type 2 diabetes, A1c 7.4 -Holding home loperamide -Discontinued Lantus started here for relative hypoglycemia (80, 8/18), fasting blood sugar 110 this a.m. -Continue sliding scale as needed, monitor CBGs  HTN, blood pressure elevated with SBP's in the 160s - amlodipine 10 mg 1 8/19 -increase hydralazine 55 mg 3 times daily -Continue Lopressor 50 mg twice daily  Family Communication  : None  Code Status : Full  Disposition Plan  :  Patient is from home. Anticipated d/c date: 2 to 3 days. Barriers to d/c or necessity for inpatient status: Still requiring intensive wound care with daily PT hydrotherapy, need to make sure no recurrent need for surgical intervention , as well as close monitoring of hyperkalemia Consults  : Surgery, ID  Procedures  : I&D on 8/14 by Dr. Lucia Gaskins  DVT Prophylaxis  : Heparin Lab Results  Component Value Date   PLT 351 02/07/2020    Diet :  Diet Order            Diet renal/carb modified with fluid restriction Diet-HS Snack? Nothing; Fluid restriction: 1200 mL Fluid; Room service appropriate? Yes; Fluid consistency: Thin  Diet effective now  Inpatient Medications Scheduled Meds: . amLODipine  10 mg Oral Daily  . collagenase   Topical TID  . heparin injection (subcutaneous)  5,000 Units Subcutaneous Q8H  . hydrALAZINE  50 mg Oral Q8H  . insulin aspart  0-15 Units Subcutaneous TID AC & HS  . metoprolol  tartrate  50 mg Oral BID  . sodium zirconium cyclosilicate  5 g Oral Once   Continuous Infusions: . DAPTOmycin (CUBICIN)  IV Stopped (02/06/20 2034)   PRN Meds:.acetaminophen **OR** acetaminophen, ondansetron **OR** ondansetron (ZOFRAN) IV, oxyCODONE-acetaminophen **OR** oxyCODONE-acetaminophen, polyethylene glycol, silver nitrate applicators  Antibiotics  :   Anti-infectives (From admission, onward)   Start     Dose/Rate Route Frequency Ordered Stop   02/04/20 2000  DAPTOmycin (CUBICIN) 600 mg in sodium chloride 0.9 % IVPB        600 mg 224 mL/hr over 30 Minutes Intravenous Every 48 hours 02/03/20 1426     02/02/20 1430  DAPTOmycin (CUBICIN) 500 mg in sodium chloride 0.9 % IVPB  Status:  Discontinued        500 mg 220 mL/hr over 30 Minutes Intravenous Every 48 hours 02/02/20 1332 02/03/20 1426   02/01/20 0800  meropenem (MERREM) 500 mg in sodium chloride 0.9 % 100 mL IVPB  Status:  Discontinued        500 mg 200 mL/hr over 30 Minutes Intravenous Every 12 hours 02/01/20 0656 02/02/20 1304   02/01/20 0655  vancomycin variable dose per unstable renal function (pharmacist dosing)  Status:  Discontinued         Does not apply See admin instructions 02/01/20 0656 02/02/20 1305   02/01/20 0300  ceFEPIme (MAXIPIME) 2 g in sodium chloride 0.9 % 100 mL IVPB        2 g 200 mL/hr over 30 Minutes Intravenous  Once 02/01/20 0252 02/01/20 0446   02/01/20 0300  vancomycin (VANCOREADY) IVPB 1500 mg/300 mL        1,500 mg 150 mL/hr over 120 Minutes Intravenous  Once 02/01/20 0252 02/01/20 0624       Objective   Vitals:   02/06/20 2104 02/07/20 0540 02/07/20 2141 02/08/20 0549  BP: (!) 177/75 (!) 158/67 (!) 166/79 (!) 156/78  Pulse: 60 63 63 65  Resp: _0 Temp: 97.6 F (36.4 C) 98.2 F (36.8 C) 97.7 F (36.5 C) 98.1 F (36.7 C)  TempSrc: Oral Oral Oral Oral  SpO2: 100% 98% 98% 97%  Weight:      Height:        SpO2: 97 % O2 Flow Rate (L/min): 2 L/min  Wt Readings from Last  3 Encounters:  02/04/20 72.3 kg  11/20/19 75.3 kg  05/10/17 83.9 kg     Intake/Output Summary (Last 24 hours) at 02/08/2020 0934 Last data filed at 02/08/2020 0932 Gross per 24 hour  Intake 600 ml  Output 1600 ml  Net -1000 ml    Physical Exam:     Awake Alert, Oriented X 3, Normal affect No new F.N deficits,  [place].AT, Normal respiratory effort on room air, CTAB RRR,No Gallops,Rubs or new Murmurs,  +ve B.Sounds, Abd Soft, No tenderness, No rebound, guarding or rigidity.   I have personally reviewed the following:   Data Reviewed:  CBC Recent Labs  Lab 02/02/20 0225 02/04/20 0319 02/05/20 0959 02/06/20 0354 02/07/20 0337  WBC 21.4* 11.1* 10.8* 11.0* 10.1  HGB 8.4* 7.7* 7.6* 7.6* 7.6*  HCT 25.8* 23.5* 24.1* 24.0* 24.4*  PLT 340 353 348 360 351  MCV 90.8 90.4 94.1 93.4 93.8  MCH 29.6 29.6 29.7 29.6 29.2  MCHC 32.6 32.8 31.5 31.7 31.1  RDW 12.9 12.6 12.4 12.2 12.1  LYMPHSABS 0.4*  --  1.8 2.1 2.2  MONOABS 1.8*  --  0.8 0.8 0.7  EOSABS 0.2  --  0.2 0.3 0.2  BASOSABS 0.0  --  0.1 0.0 0.0    Chemistries  Recent Labs  Lab 02/02/20 0225 02/03/20 0402 02/06/20 0354 02/07/20 0337 02/07/20 1508 02/07/20 1951 02/08/20 0842  NA 136   < > 139 135 139 138 138  K 4.5   < > 4.4 5.3* 5.3* 5.8* 6.2*  CL 106   < > 100 101 101 103 103  CO2 17*   < > _0 GLUCOSE 156*   < > 103* 132* 180* 169* 164*  BUN 96*   < > 58* 57* 58* 57* 62*  CREATININE 6.28*   < > 3.84* 3.99* 4.04* 3.93* 3.91*  CALCIUM 7.9*   < > 7.9* 7.9* 8.4* 8.0* 8.2*  MG 1.5*  --   --   --   --   --   --   AST 26  --   --   --   --   --   --   ALT 29  --   --   --   --   --   --   ALKPHOS 84  --   --   --   --   --   --   BILITOT 0.4  --   --   --   --   --   --    < > = values in this interval not displayed.   ------------------------------------------------------------------------------------------------------------------ No results for input(s): CHOL, HDL, LDLCALC, TRIG, CHOLHDL,  LDLDIRECT in the last 72 hours.  Lab Results  Component Value Date   HGBA1C 7.4 (H) 02/01/2020   ------------------------------------------------------------------------------------------------------------------ No results for input(s): TSH, T4TOTAL, T3FREE, THYROIDAB in the last 72 hours.  Invalid input(s): FREET3 ------------------------------------------------------------------------------------------------------------------ No results for input(s): VITAMINB12, FOLATE, FERRITIN, TIBC, IRON, RETICCTPCT in the last 72 hours.  Coagulation profile No results for input(s): INR, PROTIME in the last 168 hours.  No results for input(s): DDIMER in the last 72 hours.  Cardiac Enzymes No results for input(s): CKMB, TROPONINI, MYOGLOBIN in the last 168 hours.  Invalid input(s): CK ------------------------------------------------------------------------------------------------------------------ No results found for: BNP  Micro Results Recent Results (from the past 240 hour(s))  Blood Culture (routine x 2)     Status: None   Collection Time: 02/01/20  3:04 AM   Specimen: BLOOD RIGHT FOREARM  Result Value Ref Range Status   Specimen Description BLOOD RIGHT FOREARM  Final   Special Requests   Final    BOTTLES DRAWN AEROBIC AND ANAEROBIC Blood Culture adequate volume   Culture   Final    NO GROWTH 5 DAYS Performed at [facility], [address], [place], [place] [address]    Report Status 02/06/2020 FINAL  Final  SARS Coronavirus 2 by RT PCR (hospital order, performed in [place] hospital lab)     Status: None   Collection Time: 02/01/20  3:04 AM  Result Value Ref Range Status   SARS Coronavirus 2 NEGATIVE NEGATIVE Final    Comment: (NOTE) SARS-CoV-2 target nucleic acids are NOT DETECTED.  The SARS-CoV-2 RNA is generally detectable in upper and lower respiratory specimens during the acute phase of infection. The lowest concentration of SARS-CoV-2  viral copies this assay  can detect is 250 copies / mL. A negative result does not preclude SARS-CoV-2 infection and should not be used as the sole basis for treatment or other patient management decisions.  A negative result may occur with improper specimen collection / handling, submission of specimen other than nasopharyngeal swab, presence of viral mutation(s) within the areas targeted by this assay, and inadequate number of viral copies (<250 copies / mL). A negative result must be combined with clinical observations, patient history, and epidemiological information.  Fact Sheet for Patients:   StrictlyIdeas.no  Fact Sheet for Healthcare Providers: BankingDealers.co.za  This test is not yet approved or  cleared by the [place] FDA and has been authorized for detection and/or diagnosis of SARS-CoV-2 by FDA under an Emergency Use Authorization (EUA).  This EUA will remain in effect (meaning this test can be used) for the duration of the COVID-19 declaration under Section 564(b)(1) of the Act, 21 U.S.C. section 360bbb-3(b)(1), unless the authorization is terminated or revoked sooner.  Performed at [facility], [place] [address], [place], [place] [address]   Wound or Superficial Culture     Status: None   Collection Time: 02/01/20  4:13 AM   Specimen: Buttocks; Wound  Result Value Ref Range Status   Specimen Description BUTTOCKS  Final   Special Requests NONE  Final   Gram Stain   Final    NO WBC SEEN ABUNDANT GRAM POSITIVE COCCI RARE GRAM NEGATIVE RODS RARE GRAM POSITIVE RODS Performed at [facility], [address], [place], [place] [address]    Culture   Final    ABUNDANT METHICILLIN RESISTANT STAPHYLOCOCCUS AUREUS   Report Status 02/03/2020 FINAL  Final   Organism ID, Bacteria METHICILLIN RESISTANT STAPHYLOCOCCUS AUREUS  Final      Susceptibility   Methicillin resistant staphylococcus aureus - MIC*    CIPROFLOXACIN >=8 RESISTANT  Resistant     ERYTHROMYCIN >=8 RESISTANT Resistant     GENTAMICIN <=0.5 SENSITIVE Sensitive     OXACILLIN >=4 RESISTANT Resistant     TETRACYCLINE <=1 SENSITIVE Sensitive     VANCOMYCIN 1 SENSITIVE Sensitive     TRIMETH/SULFA <=10 SENSITIVE Sensitive     CLINDAMYCIN <=0.25 SENSITIVE Sensitive     RIFAMPIN <=0.5 SENSITIVE Sensitive     Inducible Clindamycin NEGATIVE Sensitive     * ABUNDANT METHICILLIN RESISTANT STAPHYLOCOCCUS AUREUS  Blood Culture (routine x 2)     Status: Abnormal   Collection Time: 02/01/20  4:48 AM   Specimen: BLOOD  Result Value Ref Range Status   Specimen Description BLOOD RIGHT ANTECUBITAL  Final   Special Requests   Final    BOTTLES DRAWN AEROBIC AND ANAEROBIC Blood Culture adequate volume   Culture  Setup Time   Final    GRAM POSITIVE COCCI IN CLUSTERS IN BOTH AEROBIC AND ANAEROBIC BOTTLES CRITICAL RESULT CALLED TO, READ BACK BY AND VERIFIED WITH: J. LEDFORD,PHARMD 0018 02/02/2020 Mena Goes Performed at [facility], [address] [place], [place] [address]    Culture METHICILLIN RESISTANT STAPHYLOCOCCUS AUREUS (A)  Final   Report Status 02/03/2020 FINAL  Final   Organism ID, Bacteria METHICILLIN RESISTANT STAPHYLOCOCCUS AUREUS  Final      Susceptibility   Methicillin resistant staphylococcus aureus - MIC*    CIPROFLOXACIN >=8 RESISTANT Resistant     ERYTHROMYCIN >=8 RESISTANT Resistant     GENTAMICIN <=0.5 SENSITIVE Sensitive     OXACILLIN >=4 RESISTANT Resistant  TETRACYCLINE <=1 SENSITIVE Sensitive     VANCOMYCIN 1 SENSITIVE Sensitive     TRIMETH/SULFA <=10 SENSITIVE Sensitive     CLINDAMYCIN <=0.25 SENSITIVE Sensitive     RIFAMPIN <=0.5 SENSITIVE Sensitive     Inducible Clindamycin NEGATIVE Sensitive     * METHICILLIN RESISTANT STAPHYLOCOCCUS AUREUS  Blood Culture ID Panel (Reflexed)     Status: Abnormal   Collection Time: 02/01/20  4:48 AM  Result Value Ref Range Status   Enterococcus faecalis NOT DETECTED NOT DETECTED Final    Enterococcus Faecium NOT DETECTED NOT DETECTED Final   Listeria monocytogenes NOT DETECTED NOT DETECTED Final   Staphylococcus species DETECTED (A) NOT DETECTED Final    Comment: CRITICAL RESULT CALLED TO, READ BACK BY AND VERIFIED WITH: J. LEDFORD,PHARMD 0018 02/02/2020 T. TYSOR    Staphylococcus aureus (BCID) DETECTED (A) NOT DETECTED Final    Comment: Methicillin (oxacillin)-resistant Staphylococcus aureus (MRSA). MRSA is predictably resistant to beta-lactam antibiotics (except ceftaroline). Preferred therapy is vancomycin unless clinically contraindicated. Patient requires contact precautions if  hospitalized. CRITICAL RESULT CALLED TO, READ BACK BY AND VERIFIED WITH: J. LEDFORD,PHARMD 0018 02/02/2020 T. TYSOR    Staphylococcus epidermidis NOT DETECTED NOT DETECTED Final   Staphylococcus lugdunensis NOT DETECTED NOT DETECTED Final   Streptococcus species NOT DETECTED NOT DETECTED Final   Streptococcus agalactiae NOT DETECTED NOT DETECTED Final   Streptococcus pneumoniae NOT DETECTED NOT DETECTED Final   Streptococcus pyogenes NOT DETECTED NOT DETECTED Final   A.calcoaceticus-baumannii NOT DETECTED NOT DETECTED Final   Bacteroides fragilis NOT DETECTED NOT DETECTED Final   Enterobacterales NOT DETECTED NOT DETECTED Final   Enterobacter cloacae complex NOT DETECTED NOT DETECTED Final   Escherichia coli NOT DETECTED NOT DETECTED Final   Klebsiella aerogenes NOT DETECTED NOT DETECTED Final   Klebsiella oxytoca NOT DETECTED NOT DETECTED Final   Klebsiella pneumoniae NOT DETECTED NOT DETECTED Final   Proteus species NOT DETECTED NOT DETECTED Final   Salmonella species NOT DETECTED NOT DETECTED Final   Serratia marcescens NOT DETECTED NOT DETECTED Final   Haemophilus influenzae NOT DETECTED NOT DETECTED Final   Neisseria meningitidis NOT DETECTED NOT DETECTED Final   Pseudomonas aeruginosa NOT DETECTED NOT DETECTED Final   Stenotrophomonas maltophilia NOT DETECTED NOT DETECTED Final    Candida albicans NOT DETECTED NOT DETECTED Final   Candida auris NOT DETECTED NOT DETECTED Final   Candida glabrata NOT DETECTED NOT DETECTED Final   Candida krusei NOT DETECTED NOT DETECTED Final   Candida parapsilosis NOT DETECTED NOT DETECTED Final   Candida tropicalis NOT DETECTED NOT DETECTED Final   Cryptococcus neoformans/gattii NOT DETECTED NOT DETECTED Final   Meth resistant mecA/C and MREJ DETECTED (A) NOT DETECTED Final    Comment: CRITICAL RESULT CALLED TO, READ BACK BY AND VERIFIED WITH: J. LEDFORD,PHARMD 0018 02/02/2020 T. TYSOR Performed at [facility], [address], [place], [place] [address]   Aerobic/Anaerobic Culture (surgical/deep wound)     Status: None   Collection Time: 02/01/20 11:31 AM   Specimen: Buttocks; Abscess  Result Value Ref Range Status   Specimen Description ABSCESS  Final   Special Requests BUTTOCKS  Final   Gram Stain   Final    RARE WBC PRESENT, PREDOMINANTLY PMN FEW GRAM POSITIVE COCCI    Culture   Final    MODERATE METHICILLIN RESISTANT STAPHYLOCOCCUS AUREUS NO ANAEROBES ISOLATED Performed at [facility], [address], [place], [place] [address]    Report Status 02/06/2020 FINAL  Final   Organism ID,  Bacteria METHICILLIN RESISTANT STAPHYLOCOCCUS AUREUS  Final      Susceptibility   Methicillin resistant staphylococcus aureus - MIC*    CIPROFLOXACIN >=8 RESISTANT Resistant     ERYTHROMYCIN >=8 RESISTANT Resistant     GENTAMICIN <=0.5 SENSITIVE Sensitive     OXACILLIN >=4 RESISTANT Resistant     TETRACYCLINE <=1 SENSITIVE Sensitive     VANCOMYCIN 1 SENSITIVE Sensitive     TRIMETH/SULFA <=10 SENSITIVE Sensitive     CLINDAMYCIN <=0.25 SENSITIVE Sensitive     RIFAMPIN <=0.5 SENSITIVE Sensitive     Inducible Clindamycin NEGATIVE Sensitive     * MODERATE METHICILLIN RESISTANT STAPHYLOCOCCUS AUREUS  Urine culture     Status: Abnormal (Preliminary result)   Collection Time: 02/02/20  5:05 AM   Specimen: In/Out Cath Urine   Result Value Ref Range Status   Specimen Description IN/OUT CATH URINE  Final   Special Requests NONE  Final   Culture (A)  Final    400 COLONIES/mL GRAM NEGATIVE RODS Sent to [facility] for further susceptibility testing. AND IDENTIFICATION Performed at [facility], [place] [address], [place], [place] [address]    Report Status PENDING  Incomplete  MRSA PCR Screening     Status: None   Collection Time: 02/02/20  5:16 AM   Specimen: Nasal Mucosa; Nasopharyngeal  Result Value Ref Range Status   MRSA by PCR NEGATIVE NEGATIVE Final    Comment:        The GeneXpert MRSA Assay (FDA approved for NASAL specimens only), is one component of a comprehensive MRSA colonization surveillance program. It is not intended to diagnose MRSA infection nor to guide or monitor treatment for MRSA infections. Performed at [facility], [place] [address] [place], [place] [address]   Culture, blood (routine x 2)     Status: None (Preliminary result)   Collection Time: 02/03/20  4:02 AM   Specimen: BLOOD  Result Value Ref Range Status   Specimen Description BLOOD SITE NOT SPECIFIED  Final   Special Requests   Final    BOTTLES DRAWN AEROBIC ONLY Blood Culture results may not be optimal due to an inadequate volume of blood received in culture bottles   Culture   Final    NO GROWTH 4 DAYS Performed at [facility], [place] [address] [place], [place] [address]    Report Status PENDING  Incomplete  Culture, blood (routine x 2)     Status: None (Preliminary result)   Collection Time: 02/03/20  4:13 AM   Specimen: BLOOD  Result Value Ref Range Status   Specimen Description BLOOD SITE NOT SPECIFIED  Final   Special Requests   Final    BOTTLES DRAWN AEROBIC ONLY Blood Culture results may not be optimal due to an inadequate volume of blood received in culture bottles   Culture   Final    NO GROWTH 4 DAYS Performed at [facility], [place] [address] [place], [place] [address]    Report  Status PENDING  Incomplete    Radiology Reports CT ABDOMEN PELVIS WO CONTRAST  Result Date: 02/01/2020 CLINICAL DATA:  Abdominal abscess/infection suspected Nausea. Patient reports decreased appetite. Patient reports buttock abscess. EXAM: CT ABDOMEN AND PELVIS WITHOUT CONTRAST TECHNIQUE: Multidetector CT imaging of the abdomen and pelvis was performed following the standard protocol without IV contrast. COMPARISON:  CT 03/06/2017 FINDINGS: Lower chest: Minimal subpleural scarring in the left lower lobe. No acute airspace disease. No pleural effusion. Hepatobiliary: No focal hepatic abnormality  on noncontrast exam. Multiple calcified gallstones without pericholecystic inflammation. There is no biliary dilatation. Pancreas: No ductal dilatation or inflammation. Spleen: Normal in size. No focal abnormality on noncontrast exam. Coils again seen in the splenic artery. Adrenals/Urinary Tract: Previous left adrenal hematoma has resolved, there is mild residual adrenal thickening. Normal right adrenal gland. Mild left hydronephrosis and hydroureter to the level of the distal pelvis. No stone or cause for obstruction. Mild prominence of the right ureter without hydronephrosis. There is symmetric bilateral perinephric edema. Urinary bladder is minimally distended. Stomach/Bowel: Small hiatal hernia. Stomach otherwise unremarkable. There is no bowel obstruction. No evidence of bowel wall thickening or inflammation. Few lower pelvic small bowel loops are fluid-filled but nondilated or inflamed. Normal appendix. High-riding cecum. Moderate volume of colonic stool. There is colonic tortuosity. No colonic wall thickening or inflammation. Vascular/Lymphatic: Abdominal aorta is normal in caliber. Coiling of the splenic artery. Multiple small retroperitoneal lymph nodes, not enlarged by size criteria and likely reactive. Small bilateral inguinal nodes. Reproductive: Prostate unremarkable. Right testis may be within the  inguinal canal, series 3, image 86. Other: No ascites or free air. Subcutaneous fluid collection involving the left gluteal soft tissues about the gluteal crease measuring 2.8 x 1.4 x 4.1 cm. Mild adjacent soft tissue edema tracks into the gluteal fold. Fluid collection in the inferior right gluteal soft tissues abutting the gluteal crease measures 3.0 x 2.4 x 3.8 cm. There is no air within either fluid collection or subcutaneous soft tissues. No extension into the anorectum. Musculoskeletal: Bilateral sacroiliac joint pinning and pubic symphyseal fixation. Remote left transverse process fractures in the lumbar spine. Remote left rib fractures. No acute osseous abnormalities. IMPRESSION: 1. Two gluteal fluid collections, 1 on the right and 1 on the left. Subcutaneous fluid collection involving the left gluteal soft tissues abut the gluteal crease measuring 2.8 x 1.4 x 4.1 cm, suspicious for abscess. Fluid collection in the inferior right gluteal soft tissues abutting the gluteal crease measures 3.0 x 2.4 x 3.8 cm. No air within either fluid collection or subcutaneous soft tissues. 2. Mild left hydronephrosis and hydroureter to the level of the distal pelvis. No stone or cause for obstruction. Bilateral perinephric edema, nonspecific. Decompressed urinary bladder which appears thick walled. 3. Cholelithiasis without gallbladder inflammation. 4. Small hiatal hernia. 5. Right testis may be within the inguinal canal. Recommend correlation with physical exam. Electronically Signed   By: Narda Rutherford M.D.   On: 02/01/2020 03:39   US RENAL  Result Date: 02/03/2020 CLINICAL DATA:  Acute kidney injury. EXAM: RENAL / URINARY TRACT ULTRASOUND COMPLETE COMPARISON:  CT 02/01/2020, renal ultrasound 01/03/2020 FINDINGS: Right Kidney: Renal measurements: 11.7 x 5.6 x 5.3 cm = volume: 180 mL. Mild increased renal parenchymal echogenicity. No mass or hydronephrosis visualized. Left Kidney: Renal measurements: 10.1 x 5.3 x 5.2  cm = volume: 146 mL. Mild hydronephrosis. Renal pelvis measures 12 mm in dimension. Mild increased renal echogenicity. No evidence of focal lesion. Bladder: Appears normal for degree of bladder distention. Other: Incidental gallstones. IMPRESSION: 1. Mild left hydronephrosis. This is unchanged from prior CT as well as renal ultrasound last month. Cause for obstruction of stab list. 2. Mild increased renal echogenicity consistent with chronic medical renal disease. Electronically Signed   By: Narda Rutherford M.D.   On: 02/03/2020 23:07   IR Fluoro Guide CV Line Right  Result Date: 02/05/2020 INDICATION: MRSA bacteremia, cellulitis, access for long-term antibiotics EXAM: TUNNELED PICC LINE WITH ULTRASOUND AND FLUOROSCOPIC GUIDANCE MEDICATIONS: 1% lidocaine  local. ANESTHESIA/SEDATION: Moderate Sedation Time:  None. The patient was continuously monitored during the procedure by the interventional radiology nurse under my direct supervision. FLUOROSCOPY TIME:  Fluoroscopy Time: 0 minutes 6 seconds (1 mGy). COMPLICATIONS: None immediate. PROCEDURE: Informed written consent was obtained from the patient after a discussion of the risks, benefits, and alternatives to treatment. Questions regarding the procedure were encouraged and answered. The right neck and chest were prepped with chlorhexidine in a sterile fashion, and a sterile drape was applied covering the operative field. Maximum barrier sterile technique with sterile gowns and gloves were used for the procedure. A timeout was performed prior to the initiation of the procedure. After creating a small venotomy incision, a micropuncture kit was utilized to access the right internal jugular vein under direct, real-time ultrasound guidance after the overlying soft tissues were anesthetized with 1% lidocaine with epinephrine. Ultrasound image documentation was performed. The microwire was kinked to measure appropriate catheter length. The micropuncture sheath was  exchanged for a peel-away sheath over a guidewire. A 5 French dual lumen tunneled PICC measuring 25 cm was tunneled in a retrograde fashion from the anterior chest wall to the venotomy incision. The catheter was then placed through the peel-away sheath with tip ultimately positioned at the superior caval-atrial junction. Final catheter positioning was confirmed and documented with a spot radiographic image. The catheter aspirates and flushes normally. The catheter was flushed with appropriate volume heparin dwells. The catheter exit site was secured with a 3 0 Ethilon retention suture. The venotomy incision was closed with Dermabond. Dressings were applied. The patient tolerated the procedure well without immediate post procedural complication. FINDINGS: After catheter placement, the tip lies within the superior cavoatrial junction. The catheter aspirates and flushes normally and is ready for immediate use. IMPRESSION: Successful placement of 25cm dual lumen tunneled PICC catheter via the right internal jugular vein with tip terminating at the superior caval atrial junction. The catheter is ready for immediate use. Electronically Signed   By: Jerilynn Mages.  Shick M.D.   On: 02/05/2020 16:09   IR US Guide Vasc Access Right  Result Date: 02/05/2020 INDICATION: MRSA bacteremia, cellulitis, access for long-term antibiotics EXAM: TUNNELED PICC LINE WITH ULTRASOUND AND FLUOROSCOPIC GUIDANCE MEDICATIONS: 1% lidocaine local. ANESTHESIA/SEDATION: Moderate Sedation Time:  None. The patient was continuously monitored during the procedure by the interventional radiology nurse under my direct supervision. FLUOROSCOPY TIME:  Fluoroscopy Time: 0 minutes 6 seconds (1 mGy). COMPLICATIONS: None immediate. PROCEDURE: Informed written consent was obtained from the patient after a discussion of the risks, benefits, and alternatives to treatment. Questions regarding the procedure were encouraged and answered. The right neck and chest were  prepped with chlorhexidine in a sterile fashion, and a sterile drape was applied covering the operative field. Maximum barrier sterile technique with sterile gowns and gloves were used for the procedure. A timeout was performed prior to the initiation of the procedure. After creating a small venotomy incision, a micropuncture kit was utilized to access the right internal jugular vein under direct, real-time ultrasound guidance after the overlying soft tissues were anesthetized with 1% lidocaine with epinephrine. Ultrasound image documentation was performed. The microwire was kinked to measure appropriate catheter length. The micropuncture sheath was exchanged for a peel-away sheath over a guidewire. A 5 French dual lumen tunneled PICC measuring 25 cm was tunneled in a retrograde fashion from the anterior chest wall to the venotomy incision. The catheter was then placed through the peel-away sheath with tip ultimately positioned at the superior caval-atrial  junction. Final catheter positioning was confirmed and documented with a spot radiographic image. The catheter aspirates and flushes normally. The catheter was flushed with appropriate volume heparin dwells. The catheter exit site was secured with a 3 0 Ethilon retention suture. The venotomy incision was closed with Dermabond. Dressings were applied. The patient tolerated the procedure well without immediate post procedural complication. FINDINGS: After catheter placement, the tip lies within the superior cavoatrial junction. The catheter aspirates and flushes normally and is ready for immediate use. IMPRESSION: Successful placement of 25cm dual lumen tunneled PICC catheter via the right internal jugular vein with tip terminating at the superior caval atrial junction. The catheter is ready for immediate use. Electronically Signed   By: Jerilynn Mages.  Shick M.D.   On: 02/05/2020 16:09   DG Chest Portable 1 View  Result Date: 02/01/2020 CLINICAL DATA:  Cough. EXAM:  PORTABLE CHEST 1 VIEW COMPARISON:  Radiograph 04/01/2017 FINDINGS: Heart is normal in size. Normal mediastinal contours. Remote left rib fractures with minor scarring at the left lung base. No acute or confluent airspace disease. No pleural fluid or pneumothorax. No pulmonary edema. Left upper quadrant vascular coils. IMPRESSION: 1. No acute abnormality. 2. Remote left rib fractures with minor scarring at the left lung base. Electronically Signed   By: Keith Rake M.D.   On: 02/01/2020 03:41   ECHOCARDIOGRAM COMPLETE  Result Date: 02/02/2020    ECHOCARDIOGRAM REPORT   Patient Name:   Peter [name] Date of Exam: 02/02/2020 Medical Rec #:  [id number]        Height:       70.0 in Accession #:    [id number]       Weight:       159.4 lb Date of Birth:  1961-03-18       BSA:          1.895 m Patient Age:    29 years         BP:           142/84 mmHg Patient Gender: M                HR:           84 bpm. Exam Location:  Inpatient Procedure: 2D Echo Indications:    bacteremia  History:        Patient has no prior history of Echocardiogram examinations.                 Risk Factors:Diabetes and Hypertension.  Sonographer:    Jannett Celestine RDCS (AE) Referring Phys: [place]  1. There is a linear independently mobile echodensity in the LVOT (see image 30). This cannot be clearly traced back to a structure, but aortic valve endocarditis not excluded. Left ventricular ejection fraction, by estimation, is 65 to 70%. The left ventricle has normal function. The left ventricle has no regional wall motion abnormalities. Left ventricular diastolic parameters were normal.  2. Right ventricular systolic function is normal. The right ventricular size is normal. Tricuspid regurgitation signal is inadequate for assessing PA pressure.  3. The mitral valve is normal in structure. Trivial mitral valve regurgitation. No evidence of mitral stenosis.  4. Bicuspid aortic valve, with fusion of the RCC and LCC  (image 20) with focal calcification. Mild AI. Marland Kitchen The aortic valve is bicuspid. Aortic valve regurgitation is mild. Mild aortic valve sclerosis is present, with no evidence of aortic valve stenosis.  5. The inferior vena cava is normal in size  with greater than 50% respiratory variability, suggesting right atrial pressure of 3 mmHg. Comparison(s): No prior Echocardiogram. Conclusion(s)/Recommendation(s): Bicuspid aortic valve. There is an independently mobile linear echodensity seen in the LVOT. Given bacteremia, would recommend TEE to further evaluate for endocarditis. FINDINGS  Left Ventricle: There is a linear independently mobile echodensity in the LVOT (see image 30). This cannot be clearly traced back to a structure, but aortic valve endocarditis not excluded. Left ventricular ejection fraction, by estimation, is 65 to 70%. The left ventricle has normal function. The left ventricle has no regional wall motion abnormalities. The left ventricular internal cavity size was normal in size. There is borderline left ventricular hypertrophy. Left ventricular diastolic parameters were normal. Right Ventricle: The right ventricular size is normal. No increase in right ventricular wall thickness. Right ventricular systolic function is normal. Tricuspid regurgitation signal is inadequate for assessing PA pressure. Left Atrium: Left atrial size was normal in size. Right Atrium: Right atrial size was normal in size. Pericardium: There is no evidence of pericardial effusion. Mitral Valve: The mitral valve is normal in structure. Trivial mitral valve regurgitation. No evidence of mitral valve stenosis. Tricuspid Valve: The tricuspid valve is normal in structure. Tricuspid valve regurgitation is trivial. No evidence of tricuspid stenosis. Aortic Valve: Bicuspid aortic valve, with fusion of the RCC and LCC (image 20) with focal calcification. Mild AI. The aortic valve is bicuspid. . There is mild thickening and mild calcification  of the aortic valve. Aortic valve regurgitation is mild. Mild aortic valve sclerosis is present, with no evidence of aortic valve stenosis. There is mild thickening of the aortic valve. There is mild calcification of the aortic valve. Pulmonic Valve: The pulmonic valve was grossly normal. Pulmonic valve regurgitation is trivial. Aorta: The aortic root was not well visualized, the ascending aorta was not well visualized and the aortic arch was not well visualized. Venous: The inferior vena cava is normal in size with greater than 50% respiratory variability, suggesting right atrial pressure of 3 mmHg. IAS/Shunts: The atrial septum is grossly normal.  LEFT VENTRICLE PLAX 2D LVIDd:         4.70 cm  Diastology LVIDs:         2.80 cm  LV e' lateral:   7.62 cm/s LV PW:         1.10 cm  LV E/e' lateral: 9.7 LV IVS:        1.20 cm  LV e' medial:    5.98 cm/s LVOT diam:     2.20 cm  LV E/e' medial:  12.4 LV SV:         87 LV SV Index:   46 LVOT Area:     3.80 cm  RIGHT VENTRICLE RV S prime:     9.46 cm/s TAPSE (M-mode): 1.7 cm LEFT ATRIUM           Index       RIGHT ATRIUM          Index LA diam:      3.00 cm 1.58 cm/m  RA Area:     8.89 cm LA Vol (A2C): 20.6 ml 10.87 ml/m RA Volume:   15.00 ml 7.91 ml/m LA Vol (A4C): 21.9 ml 11.55 ml/m  AORTIC VALVE LVOT Vmax:   95.30 cm/s LVOT Vmean:  71.000 cm/s LVOT VTI:    0.229 m  AORTA Ao Root diam: 3.60 cm MITRAL VALVE MV Area (PHT): 3.27 cm     SHUNTS MV Decel Time: 232 msec  Systemic VTI:  0.23 m MV E velocity: 74.10 cm/s   Systemic Diam: 2.20 cm MV A velocity: 102.00 cm/s MV E/A ratio:  0.73 Buford Dresser MD Electronically signed by Buford Dresser MD Signature Date/Time: 02/02/2020/4:39:02 PM    Final    ECHO TEE  Result Date: 02/04/2020    TRANSESOPHOGEAL ECHO REPORT   Patient Name:   Peter [name] Date of Exam: 02/04/2020 Medical Rec #:  [id number]        Height:       70.0 in Accession #:    [id number]       Weight:       159.4 lb Date of Birth:   Feb 24, 1961       BSA:          1.895 m Patient Age:    49 years         BP:           156/81 mmHg Patient Gender: M                HR:           64 bpm. Exam Location:  Inpatient Procedure: Transesophageal Echo and Color Doppler Indications:     Bacteremia  History:         Patient has prior history of Echocardiogram examinations, most                  recent 02/02/2020. Risk Factors:Diabetes, Hypertension and                  Dyslipidemia. CKD.  Sonographer:     Clayton Lefort RDCS (AE) Referring Phys:  [id number] [place] Diagnosing Phys: Buford Dresser MD PROCEDURE: After discussion of the risks and benefits of a TEE, an informed consent was obtained from the patient. The transesophogeal probe was passed without difficulty through the esophogus of the patient. Local oropharyngeal anesthetic was provided with viscous lidocaine. Sedation performed by different physician. The patient was monitored while under deep sedation. Anesthestetic sedation was provided intravenously by Anesthesiology: 139.3mg  of Propofol. Image quality was good. The patient's vital signs; including heart rate, blood pressure, and oxygen saturation; remained stable throughout the procedure. The patient developed no complications during the procedure. IMPRESSIONS  1. Left ventricular ejection fraction, by estimation, is 65 to 70%. The left ventricle has normal function. The left ventricle has no regional wall motion abnormalities.  2. Right ventricular systolic function is normal. The right ventricular size is normal.  3. No left atrial/left atrial appendage thrombus was detected.  4. The mitral valve is normal in structure. Trivial mitral valve regurgitation. No evidence of mitral stenosis.  5. The aortic valve is bicuspid. Aortic valve regurgitation is trivial. No aortic stenosis is present.  6. There is mild (Grade II) plaque involving the descending aorta. Conclusion(s)/Recommendation(s): No evidence of endocarditis. Bicuspid aortic  valve. Structure seen on TTE is associated with the mitral valve, not the aortic, near the mitral-aortic annular junction. This appears to be a chordae, not consistent with vegetation. FINDINGS  Left Ventricle: Left ventricular ejection fraction, by estimation, is 65 to 70%. The left ventricle has normal function. The left ventricle has no regional wall motion abnormalities. The left ventricular internal cavity size was normal in size. There is  borderline left ventricular hypertrophy. Right Ventricle: The right ventricular size is normal. No increase in right ventricular wall thickness. Right ventricular systolic function is normal. Left Atrium: Left atrial size was not assessed. No left atrial/left  atrial appendage thrombus was detected. Right Atrium: Right atrial size was not assessed. Pericardium: There is no evidence of pericardial effusion. Mitral Valve: The mitral valve is normal in structure. Trivial mitral valve regurgitation. No evidence of mitral valve stenosis. Tricuspid Valve: The tricuspid valve is normal in structure. Tricuspid valve regurgitation is trivial. No evidence of tricuspid stenosis. Aortic Valve: The aortic valve is bicuspid. Aortic valve regurgitation is trivial. No aortic stenosis is present. Pulmonic Valve: The pulmonic valve was grossly normal. Pulmonic valve regurgitation is trivial. Aorta: The aortic root is normal in size and structure. There is mild (Grade II) plaque involving the descending aorta. IAS/Shunts: No atrial level shunt detected by color flow Doppler. Buford Dresser MD Electronically signed by Buford Dresser MD Signature Date/Time: 02/04/2020/5:29:42 PM    Final      Time Spent in minutes  30     Desiree Hane M.D on 02/08/2020 at 9:34 AM  To page go to [email or web address] - password [email or web address]

## 2020-02-08 NOTE — Progress Notes (Signed)
Physical Therapy Wound Treatment Patient Details  Name: Peter [name] MRN: [id number] Date of Birth: Nov 29, 1960  Today's Date: 02/08/2020 Time: 4917-9150 Time Calculation (min): 51 min  Subjective  Subjective: Reports nursing has been giving him pain meds (percocet) prior to hydrotherapy, but meds take ~2 hours to "kick in" Patient and Family Stated Goals: heal wounds Date of Onset: 02/03/20 Prior Treatments: topical treatment  Pain Score: "not bad"; reports pain medication usually doesn't start working for 1-2 hours after completing hydrotherapy  Wound Assessment  Wound / Incision (Open or Dehisced) 02/04/20 Incision - Open Buttocks Right Right superior buttock wound  (Active)  Wound Image   02/04/20 1300  Dressing Type Barrier Film (skin prep);Gauze (Comment);Mesh briefs;Normal saline moist dressing;Tape dressing;Other (Comment) 02/08/20 1125  Dressing Changed Changed 02/08/20 1125  Dressing Status Clean;Dry;Intact 02/08/20 1125  Dressing Change Frequency Twice a day 02/08/20 1125  Site / Wound Assessment Brown;Granulation tissue;Red;Yellow 02/08/20 1125  % Wound base Red or Granulating 60% 02/08/20 1125  % Wound base Yellow/Fibrinous Exudate 40% 02/08/20 1125  % Wound base Black/Eschar 10% 02/06/20 1257  Peri-wound Assessment Intact 02/08/20 1125  Wound Length (cm) 3.2 cm 02/04/20 1300  Wound Width (cm) 2.4 cm 02/04/20 1300  Wound Depth (cm) 1.9 cm 02/04/20 1300  Wound Volume (cm^3) 14.59 cm^3 02/04/20 1300  Wound Surface Area (cm^2) 7.68 cm^2 02/04/20 1300  Margins Unattached edges (unapproximated) 02/08/20 1125  Closure None 02/05/20 2107  Drainage Amount Minimal 02/08/20 1125  Drainage Description Serosanguineous 02/08/20 1125  Non-staged Wound Description Full thickness 02/08/20 1125  Treatment Cleansed;Debridement (Selective);Hydrotherapy (Pulse lavage);Packing (Saline gauze) 02/08/20 1125     Wound / Incision (Open or Dehisced) 02/04/20 Incision - Open Buttocks  Right R central buttock incision  (Active)  Wound Image   02/04/20 1300  Dressing Type Barrier Film (skin prep);Normal saline moist dressing;Tape dressing;Other (Comment) 02/08/20 1125  Dressing Changed Changed 02/08/20 1125  Dressing Status Clean;Dry;Intact 02/08/20 1125  Dressing Change Frequency Twice a day 02/08/20 1125  Site / Wound Assessment Brown;Granulation tissue;Red;Yellow 02/08/20 1125  % Wound base Red or Granulating 30% 02/08/20 1125  % Wound base Yellow/Fibrinous Exudate 70% 02/08/20 1125  % Wound base Black/Eschar 0% 02/08/20 1125  % Wound base Other/Granulation Tissue (Comment) 0% 02/06/20 1257  Peri-wound Assessment Erythema (blanchable) 02/08/20 1125  Wound Length (cm) 3.3 cm 02/04/20 1300  Wound Width (cm) 0.7 cm 02/04/20 1300  Wound Depth (cm) 1.2 cm 02/04/20 1300  Wound Volume (cm^3) 2.77 cm^3 02/04/20 1300  Wound Surface Area (cm^2) 2.31 cm^2 02/04/20 1300  Margins Unattached edges (unapproximated) 02/08/20 1125  Closure None 02/05/20 2107  Drainage Amount Minimal 02/08/20 1125  Drainage Description Serosanguineous 02/08/20 1125  Non-staged Wound Description Full thickness 02/08/20 1125  Treatment Cleansed;Debridement (Selective);Hydrotherapy (Pulse lavage);Packing (Saline gauze);Tape changed 02/08/20 1125     Wound / Incision (Open or Dehisced) 02/04/20 Incision - Open Buttocks Right R inferior buttock main  (Active)  Wound Image   02/04/20 1300  Dressing Type Barrier Film (skin prep);Gauze (Comment);Normal saline moist dressing;Tape dressing;Other (Comment) 02/08/20 1125  Dressing Changed Changed 02/08/20 1125  Dressing Status Clean;Dry;Intact 02/08/20 1125  Dressing Change Frequency Twice a day 02/08/20 1125  Site / Wound Assessment Yellow;Pink 02/08/20 1125  % Wound base Red or Granulating 25% 02/08/20 1125  % Wound base Yellow/Fibrinous Exudate 75% 02/08/20 1125  % Wound base Black/Eschar 0% 02/06/20 1257  % Wound base Other/Granulation Tissue (Comment)  0% 02/06/20 1257  Peri-wound Assessment Erythema (blanchable) 02/08/20 1125  Wound Length (cm)  5.2 cm 02/04/20 1300  Wound Width (cm) 3.3 cm 02/04/20 1300  Wound Depth (cm) 2.2 cm 02/04/20 1300  Wound Volume (cm^3) 37.75 cm^3 02/04/20 1300  Wound Surface Area (cm^2) 17.16 cm^2 02/04/20 1300  Margins Unattached edges (unapproximated) 02/08/20 1125  Closure None 02/08/20 1125  Drainage Amount Minimal 02/08/20 1125  Drainage Description Serosanguineous 02/08/20 1125  Non-staged Wound Description Full thickness 02/08/20 1125  Treatment Cleansed;Debridement (Selective);Hydrotherapy (Pulse lavage);Packing (Saline gauze) 02/08/20 1125     Wound / Incision (Open or Dehisced) 02/04/20 Incision - Open Buttocks Right R Inferior buttock satellite (will likely merge with R inferior buttock main) (Active)  Dressing Type Barrier Film (skin prep);Gauze (Comment);Normal saline moist dressing;Tape dressing;Other (Comment) 02/08/20 1125  Dressing Changed Changed 02/08/20 1125  Dressing Status Clean;Dry;Intact 02/08/20 1125  Dressing Change Frequency Twice a day 02/08/20 1125  Site / Wound Assessment Pink;Yellow 02/08/20 1125  % Wound base Red or Granulating 10% 02/08/20 1125  % Wound base Yellow/Fibrinous Exudate 90% 02/08/20 1125  % Wound base Black/Eschar 0% 02/05/20 2107  % Wound base Other/Granulation Tissue (Comment) 0% 02/05/20 2107  Peri-wound Assessment Erythema (blanchable);Maceration 02/08/20 1125  Wound Length (cm) 1.1 cm 02/04/20 1300  Wound Width (cm) 0.8 cm 02/04/20 1300  Wound Depth (cm) 0 cm 02/04/20 1300  Wound Volume (cm^3) 0 cm^3 02/04/20 1300  Wound Surface Area (cm^2) 0.88 cm^2 02/04/20 1300  Margins Unattached edges (unapproximated) 02/08/20 1125  Closure None 02/08/20 1125  Drainage Amount Minimal 02/08/20 1125  Drainage Description Serosanguineous 02/08/20 1125  Non-staged Wound Description Full thickness 02/08/20 1125  Treatment Cleansed;Debridement (Selective);Hydrotherapy  (Pulse lavage);Packing (Saline gauze) 02/08/20 1125     Wound / Incision (Open or Dehisced) 02/04/20 Incision - Open Buttocks Left L central buttock  (Active)  Wound Image   02/04/20 1400  Dressing Type Barrier Film (skin prep);Gauze (Comment);Mesh briefs;Normal saline moist dressing;Tape dressing;Other (Comment) 02/08/20 1125  Dressing Changed Changed 02/08/20 1125  Dressing Status Clean;Dry;Intact 02/08/20 1125  Dressing Change Frequency Twice a day 02/08/20 1125  Site / Wound Assessment Pink;Yellow 02/08/20 1125  % Wound base Red or Granulating 25% 02/08/20 1125  % Wound base Yellow/Fibrinous Exudate 75% 02/08/20 1125  % Wound base Black/Eschar 30% 02/05/20 2107  Peri-wound Assessment Erythema (blanchable) 02/08/20 1125  Wound Length (cm) 4.8 cm 02/04/20 1400  Wound Width (cm) 1.3 cm 02/04/20 1400  Wound Depth (cm) 2.4 cm 02/04/20 1400  Wound Volume (cm^3) 14.98 cm^3 02/04/20 1400  Wound Surface Area (cm^2) 6.24 cm^2 02/04/20 1400  Margins Unattached edges (unapproximated) 02/08/20 1125  Closure None 02/08/20 1125  Drainage Amount Minimal 02/08/20 1125  Drainage Description Serosanguineous 02/08/20 1125  Non-staged Wound Description Full thickness 02/08/20 1125  Treatment Cleansed;Debridement (Selective);Hydrotherapy (Pulse lavage);Packing (Saline gauze) 02/08/20 1125     Wound / Incision (Open or Dehisced) 02/04/20 Incision - Open Buttocks Left L inferior buttock  (Active)  Wound Image   02/04/20 1300  Dressing Type Barrier Film (skin prep);Gauze (Comment);Normal saline moist dressing;Tape dressing;Other (Comment) 02/08/20 1125  Dressing Changed Changed 02/08/20 1125  Dressing Status Clean;Dry;Intact 02/08/20 1125  Dressing Change Frequency Twice a day 02/08/20 1125  Site / Wound Assessment Yellow;Pink 02/08/20 1125  % Wound base Red or Granulating 20% 02/08/20 1125  % Wound base Yellow/Fibrinous Exudate 80% 02/08/20 1125  % Wound base Black/Eschar 50% 02/06/20 1257  % Wound  base Other/Granulation Tissue (Comment) 0% 02/06/20 1257  Peri-wound Assessment Erythema (blanchable) 02/08/20 1125  Wound Length (cm) 4.7 cm 02/04/20 1400  Wound Width (cm) 1.2  cm 02/04/20 1400  Wound Depth (cm) 1.8 cm 02/04/20 1400  Wound Volume (cm^3) 10.15 cm^3 02/04/20 1400  Wound Surface Area (cm^2) 5.64 cm^2 02/04/20 1400  Margins Unattached edges (unapproximated) 02/08/20 1125  Closure None 02/08/20 1125  Drainage Amount Minimal 02/08/20 1125  Drainage Description Serosanguineous 02/08/20 1125  Non-staged Wound Description Full thickness 02/08/20 1125  Treatment Cleansed;Debridement (Selective);Hydrotherapy (Pulse lavage);Packing (Saline gauze);Tape changed 02/08/20 1125      Hydrotherapy Pulsed lavage therapy - wound location: all buttock wounds Pulsed Lavage with Suction (psi):  (8-12) Pulsed Lavage with Suction - Normal Saline Used: 2000 mL Pulsed Lavage Tip: Tip with splash shield Selective Debridement Selective Debridement - Location: L and R buttock wounds  Selective Debridement - Tools Used: Forceps;Scalpel Selective Debridement - Tissue Removed: necrotic tissue (yellow, brown)   Wound Assessment and Plan  Wound Therapy - Assess/Plan/Recommendations Wound Therapy - Clinical Statement: Continues to benefit from hydrotherapy to reduce necrotic tissue in buttock wounds to promote healing.  Wound Therapy - Functional Problem List: uncontrolled DM Factors Delaying/Impairing Wound Healing: Diabetes Mellitus;Altered sensation;Infection - systemic/local;Multiple medical problems Hydrotherapy Plan: Debridement;Dressing change;Pulsatile lavage with suction;Patient/family education Wound Therapy - Frequency: 6X / week Wound Therapy - Follow Up Recommendations: [place] Wound Plan: see above  Wound Therapy Goals- Improve the function of patient's integumentary system by progressing the wound(s) through the phases of wound healing (inflammation - proliferation -  remodeling) by: Decrease Necrotic Tissue to: 0 Decrease Necrotic Tissue - Progress: Progressing toward goal Increase Granulation Tissue to: 100 Increase Granulation Tissue - Progress: Progressing toward goal Goals/treatment plan/discharge plan were made with and agreed upon by patient/family: Yes Time For Goal Achievement: 7 days Wound Therapy - Potential for Goals: Fair  Goals will be updated until maximal potential achieved or discharge criteria met.  Discharge criteria: when goals achieved, discharge from hospital, MD decision/surgical intervention, no progress towards goals, refusal/missing three consecutive treatments without notification or medical reason.  GP     Arby Barrette, PT Pager [phone]   Rexanne Mano 02/08/2020, 11:47 AM

## 2020-02-09 LAB — BASIC METABOLIC PANEL
Anion gap: 10 (ref 5–15)
Anion gap: 9 (ref 5–15)
BUN: 69 mg/dL — ABNORMAL HIGH (ref 6–20)
BUN: 69 mg/dL — ABNORMAL HIGH (ref 6–20)
CO2: 25 mmol/L (ref 22–32)
CO2: 23 mmol/L (ref 22–32)
Calcium: 8.2 mg/dL — ABNORMAL LOW (ref 8.9–10.3)
Calcium: 8.4 mg/dL — ABNORMAL LOW (ref 8.9–10.3)
Chloride: 102 mmol/L (ref 98–111)
Chloride: 104 mmol/L (ref 98–111)
Creatinine, Ser: 4.18 mg/dL — ABNORMAL HIGH (ref 0.61–1.24)
Creatinine, Ser: 4.3 mg/dL — ABNORMAL HIGH (ref 0.61–1.24)
GFR calc Af Amer: 17 mL/min — ABNORMAL LOW (ref >60)
GFR calc Af Amer: 16 mL/min — ABNORMAL LOW (ref >60)
GFR calc non Af Amer: 15 mL/min — ABNORMAL LOW (ref >60)
GFR calc non Af Amer: 14 mL/min — ABNORMAL LOW (ref >60)
Glucose, Bld: 112 mg/dL — ABNORMAL HIGH (ref 70–99)
Glucose, Bld: 157 mg/dL — ABNORMAL HIGH (ref 70–99)
Potassium: 5.9 mmol/L — ABNORMAL HIGH (ref 3.5–5.1)
Potassium: 6.3 mmol/L (ref 3.5–5.1)
Sodium: 137 mmol/L (ref 135–145)
Sodium: 136 mmol/L (ref 135–145)

## 2020-02-09 LAB — NA AND K (SODIUM & POTASSIUM), RAND UR
Potassium Urine: 20 mmol/L
Sodium, Ur: 73 mmol/L

## 2020-02-09 LAB — GLUCOSE, CAPILLARY
Glucose-Capillary: 125 mg/dL — ABNORMAL HIGH (ref 70–99)
Glucose-Capillary: 132 mg/dL — ABNORMAL HIGH (ref 70–99)
Glucose-Capillary: 176 mg/dL — ABNORMAL HIGH (ref 70–99)

## 2020-02-09 LAB — CBC
HCT: 25.5 % — ABNORMAL LOW (ref 39.0–52.0)
Hemoglobin: 7.8 g/dL — ABNORMAL LOW (ref 13.0–17.0)
MCH: 28.8 pg (ref 26.0–34.0)
MCHC: 30.6 g/dL (ref 30.0–36.0)
MCV: 94.1 fL (ref 80.0–100.0)
Platelets: 322 10*3/uL (ref 150–400)
RBC: 2.71 MIL/uL — ABNORMAL LOW (ref 4.22–5.81)
RDW: 12.4 % (ref 11.5–15.5)
WBC: 9.4 10*3/uL (ref 4.0–10.5)
nRBC: 0 % (ref 0.0–0.2)

## 2020-02-09 LAB — POTASSIUM: Potassium: 4.9 mmol/L (ref 3.5–5.1)

## 2020-02-09 MED ORDER — CHLORTHALIDONE 25 MG PO TABS
25.0000 mg | ORAL_TABLET | Freq: Every day | ORAL | Status: DC
  Administered 2020-02-09 – 2020-02-14 (×6): 25 mg via ORAL
  Filled 2020-02-09 (×6): qty 1

## 2020-02-09 MED ORDER — SODIUM ZIRCONIUM CYCLOSILICATE 10 G PO PACK
10.0000 g | PACK | Freq: Two times a day (BID) | ORAL | Status: AC
  Administered 2020-02-09 (×2): 10 g via ORAL
  Filled 2020-02-09 (×2): qty 1

## 2020-02-09 MED ORDER — SODIUM ZIRCONIUM CYCLOSILICATE 10 G PO PACK
10.0000 g | PACK | Freq: Once | ORAL | Status: AC
  Administered 2020-02-09: 10 g via ORAL
  Filled 2020-02-09: qty 1

## 2020-02-09 MED ORDER — HYDROCHLOROTHIAZIDE 25 MG PO TABS
25.0000 mg | ORAL_TABLET | Freq: Every day | ORAL | Status: DC
  Administered 2020-02-09: 25 mg via ORAL
  Filled 2020-02-09: qty 1

## 2020-02-09 NOTE — Progress Notes (Addendum)
[facility]  PROGRESS NOTE  Peter [name] ZHG:[id number] DOB: 1960/07/18 DOA: 01/31/2020 PCP: Raelene Bott, MD Admit date - 01/31/2020   Admitting Physician Vernelle Emerald, MD  Outpatient Primary MD for the patient is Raelene Bott, MD  LOS - 8 Brief Narrative  Peter [name] is a 59 y.o. year old male with medical history significant for uncontrolled type 2 diabetes, diabetic nephropathy, HTN, HLD, CKD stage IV who presented on 8/13/21subjective chills, generalized weakness, nausea and vomiting in multiple draining ulcers of the buttocks for the past week and was found to have leukocytosis, AKI with creatinine of 6, and CT imaging consistent with gluteal fluid collections concerning for abscesses that required debridement by Dr. Lucia Gaskins with surgery on 8/14.  Patient was found to have MRSA bacteremia as well as staph aureus growing from cultures from the abscess.  Empiric vancomycin and meropenem were changed to daptomycin due to acute on chronic kidney disease and culture data with ID consultants assistance.  Patient underwent TTE and TEE which were negative for any signs of endocarditis.  Subjective  Today has no acute complaints.  Tolerating hydrotherapy with PT. Eating well. Having Bms. No chest pain or SOB A & P   Deep tissue infection of buttocks with multiple abscesses complicated by MRSA bacteremia, stable.  Status post I&D on 8/14 by Dr. Lucia Gaskins.  Remains afebrile, white count stable at 11,000.  Patient still requiring daily hydrotherapy to help with necrotic tissue -Continue daptomycin x4 weeks per ID, outpatient follow-up arranged(weekly ck and BMP) -Daily PT hydrotherapy for necrotic tissue management, decrease to twice daily daily dressing changes, Santyl to wound edges and bases,  not ready for discharge yet per surgery -As needed pain control with oxycodone  AKI on CKD stage IV and persistent moderate hyperkalemia, K is 6.3. had 10 mg lokelma x1 yesterday. No  peaked T waves on EKG, remains asymptomatic. Likely due to poor clearance from diminished kidney function.   Creatinine currently4.1, previous baseline of 4.4 (per care everywhere).  Still maintaining adequate output, no acute indication for dialysis.  renal ultrasound consistent with chronic medical renal disease, also mild left hydronephrosis unchanged from prior CT last month -Outpatient nephrology referral placed -Avoid nephrotoxins, monitor BMP and output -renal diet, low potassium -repeat lokelma 30 mg total today, add chlorthalidone (hctz not great with GFR),  monitor K q4h, telemetry --plan for standing dose of lokelma 10 mg daily starting 8/23 --if hyperk remains persistent despite medical therapies above will consult nephrology  Normocytic anemia, slightly worsening on admission hemoglobin 8.8-9.2.  Currently 7.6 with no signs or symptoms of bleeding -Monitor CBC  Type 2 diabetes, A1c 7.4 -Holding home loperamide -Discontinued Lantus started here for relative hypoglycemia (80, 8/18), fasting blood sugar 110 this a.m. -Continue sliding scale as needed, monitor CBGs  HTN, blood pressure elevated with SBP's in the 160s - amlodipine 10 mg 1 8/19 -hydralazine 50 mg 3 times daily -D/c lopressor and start chlothalidone 25 mg, help with BP and hyperkalemia. If BP does not improve can trial lasix 20-40 mg BID or torsemide  (discussed with nephrology)  Family Communication  : None  Code Status : Full  Disposition Plan  :  Patient is from home. Anticipated d/c date: 2 to 3 days. Barriers to d/c or necessity for inpatient status: Still requiring intensive wound care with daily PT hydrotherapy, need to make sure no recurrent need for surgical intervention , as well as close monitoring of hyperkalemia Consults  : Surgery, ID  Procedures  : I&D on 8/14 by Dr. Lucia Gaskins  DVT Prophylaxis  : Heparin Lab Results  Component Value Date   PLT 351 02/07/2020    Diet :  Diet Order             Diet renal/carb modified with fluid restriction Diet-HS Snack? Nothing; Fluid restriction: 1200 mL Fluid; Room service appropriate? Yes; Fluid consistency: Thin  Diet effective now                  Inpatient Medications Scheduled Meds: . amLODipine  10 mg Oral Daily  . collagenase   Topical TID  . heparin injection (subcutaneous)  5,000 Units Subcutaneous Q8H  . hydrALAZINE  50 mg Oral Q8H  . hydrochlorothiazide  25 mg Oral Daily  . insulin aspart  0-15 Units Subcutaneous TID AC & HS  . sodium zirconium cyclosilicate  10 g Oral Once   Continuous Infusions: . DAPTOmycin (CUBICIN)  IV 600 mg (02/08/20 2159)   PRN Meds:.acetaminophen **OR** acetaminophen, ondansetron **OR** ondansetron (ZOFRAN) IV, oxyCODONE-acetaminophen **OR** oxyCODONE-acetaminophen, polyethylene glycol, silver nitrate applicators  Antibiotics  :   Anti-infectives (From admission, onward)   Start     Dose/Rate Route Frequency Ordered Stop   02/04/20 2000  DAPTOmycin (CUBICIN) 600 mg in sodium chloride 0.9 % IVPB        600 mg 224 mL/hr over 30 Minutes Intravenous Every 48 hours 02/03/20 1426     02/02/20 1430  DAPTOmycin (CUBICIN) 500 mg in sodium chloride 0.9 % IVPB  Status:  Discontinued        500 mg 220 mL/hr over 30 Minutes Intravenous Every 48 hours 02/02/20 1332 02/03/20 1426   02/01/20 0800  meropenem (MERREM) 500 mg in sodium chloride 0.9 % 100 mL IVPB  Status:  Discontinued        500 mg 200 mL/hr over 30 Minutes Intravenous Every 12 hours 02/01/20 0656 02/02/20 1304   02/01/20 0655  vancomycin variable dose per unstable renal function (pharmacist dosing)  Status:  Discontinued         Does not apply See admin instructions 02/01/20 0656 02/02/20 1305   02/01/20 0300  ceFEPIme (MAXIPIME) 2 g in sodium chloride 0.9 % 100 mL IVPB        2 g 200 mL/hr over 30 Minutes Intravenous  Once 02/01/20 0252 02/01/20 0446   02/01/20 0300  vancomycin (VANCOREADY) IVPB 1500 mg/300 mL        1,500 mg 150 mL/hr over  120 Minutes Intravenous  Once 02/01/20 0252 02/01/20 0624       Objective   Vitals:   02/08/20 0549 02/08/20 1637 02/08/20 2151 02/09/20 0508  BP: (!) 156/78 133/71 (!) 142/69 (!) 153/97  Pulse: 65 (!) 58 62 62  Resp: _0 Temp: 98.1 F (36.7 C) (!) 97.5 F (36.4 C) 98.2 F (36.8 C) 98.2 F (36.8 C)  TempSrc: Oral     SpO2: 97% 96% 97% 99%  Weight:      Height:        SpO2: 99 % O2 Flow Rate (L/min): 2 L/min  Wt Readings from Last 3 Encounters:  02/04/20 72.3 kg  11/20/19 75.3 kg  05/10/17 83.9 kg     Intake/Output Summary (Last 24 hours) at 02/09/2020 0908 Last data filed at 02/09/2020 0740 Gross per 24 hour  Intake 720 ml  Output 2600 ml  Net -1880 ml    Physical Exam:     Awake Alert, Oriented X 3,  Normal affect No new F.N deficits,  [place].AT, R chest wall port in place c/d/i Normal respiratory effort on room air, CTAB RRR,No Gallops,Rubs or new Murmurs,  +ve B.Sounds, Abd Soft, No tenderness, No rebound, guarding or rigidity.   I have personally reviewed the following:   Data Reviewed:  CBC Recent Labs  Lab 02/04/20 0319 02/05/20 0959 02/06/20 0354 02/07/20 0337  WBC 11.1* 10.8* 11.0* 10.1  HGB 7.7* 7.6* 7.6* 7.6*  HCT 23.5* 24.1* 24.0* 24.4*  PLT 353 348 360 351  MCV 90.4 94.1 93.4 93.8  MCH 29.6 29.7 29.6 29.2  MCHC 32.8 31.5 31.7 31.1  RDW 12.6 12.4 12.2 12.1  LYMPHSABS  --  1.8 2.1 2.2  MONOABS  --  0.8 0.8 0.7  EOSABS  --  0.2 0.3 0.2  BASOSABS  --  0.1 0.0 0.0    Chemistries  Recent Labs  Lab 02/07/20 0337 02/07/20 0337 02/07/20 1508 02/07/20 1508 02/07/20 1951 02/07/20 1951 02/08/20 0842 02/08/20 1158 02/08/20 1936 02/08/20 2104 02/09/20 0201  NA 135  --  139  --  138  --  138  --   --   --  137  K 5.3*   < > 5.3*   < > 5.8*   < > 6.2* 5.2* 5.2* 5.7* 5.9*  CL 101  --  101  --  103  --  103  --   --   --  102  CO2 26  --  27  --  29  --  25  --   --   --  25  GLUCOSE 132*  --  180*  --  169*  --  164*  --    --   --  112*  BUN 57*  --  58*  --  57*  --  62*  --   --   --  69*  CREATININE 3.99*  --  4.04*  --  3.93*  --  3.91*  --   --   --  4.18*  CALCIUM 7.9*  --  8.4*  --  8.0*  --  8.2*  --   --   --  8.2*   < > = values in this interval not displayed.   ------------------------------------------------------------------------------------------------------------------ No results for input(s): CHOL, HDL, LDLCALC, TRIG, CHOLHDL, LDLDIRECT in the last 72 hours.  Lab Results  Component Value Date   HGBA1C 7.4 (H) 02/01/2020   ------------------------------------------------------------------------------------------------------------------ No results for input(s): TSH, T4TOTAL, T3FREE, THYROIDAB in the last 72 hours.  Invalid input(s): FREET3 ------------------------------------------------------------------------------------------------------------------ No results for input(s): VITAMINB12, FOLATE, FERRITIN, TIBC, IRON, RETICCTPCT in the last 72 hours.  Coagulation profile No results for input(s): INR, PROTIME in the last 168 hours.  No results for input(s): DDIMER in the last 72 hours.  Cardiac Enzymes No results for input(s): CKMB, TROPONINI, MYOGLOBIN in the last 168 hours.  Invalid input(s): CK ------------------------------------------------------------------------------------------------------------------ No results found for: BNP  Micro Results Recent Results (from the past 240 hour(s))  Blood Culture (routine x 2)     Status: None   Collection Time: 02/01/20  3:04 AM   Specimen: BLOOD RIGHT FOREARM  Result Value Ref Range Status   Specimen Description BLOOD RIGHT FOREARM  Final   Special Requests   Final    BOTTLES DRAWN AEROBIC AND ANAEROBIC Blood Culture adequate volume   Culture   Final    NO GROWTH 5 DAYS Performed at [facility], [address], [place], [place] [address]  Report Status 02/06/2020 FINAL  Final  SARS Coronavirus 2 by RT PCR (hospital  order, performed in [place] hospital lab)     Status: None   Collection Time: 02/01/20  3:04 AM  Result Value Ref Range Status   SARS Coronavirus 2 NEGATIVE NEGATIVE Final    Comment: (NOTE) SARS-CoV-2 target nucleic acids are NOT DETECTED.  The SARS-CoV-2 RNA is generally detectable in upper and lower respiratory specimens during the acute phase of infection. The lowest concentration of SARS-CoV-2 viral copies this assay can detect is 250 copies / mL. A negative result does not preclude SARS-CoV-2 infection and should not be used as the sole basis for treatment or other patient management decisions.  A negative result may occur with improper specimen collection / handling, submission of specimen other than nasopharyngeal swab, presence of viral mutation(s) within the areas targeted by this assay, and inadequate number of viral copies (<250 copies / mL). A negative result must be combined with clinical observations, patient history, and epidemiological information.  Fact Sheet for Patients:   StrictlyIdeas.no  Fact Sheet for Healthcare Providers: BankingDealers.co.za  This test is not yet approved or  cleared by the [place] FDA and has been authorized for detection and/or diagnosis of SARS-CoV-2 by FDA under an Emergency Use Authorization (EUA).  This EUA will remain in effect (meaning this test can be used) for the duration of the COVID-19 declaration under Section 564(b)(1) of the Act, 21 U.S.C. section 360bbb-3(b)(1), unless the authorization is terminated or revoked sooner.  Performed at [facility], [place] [address] [place], [place] [address]   Wound or Superficial Culture     Status: None   Collection Time: 02/01/20  4:13 AM   Specimen: Buttocks; Wound  Result Value Ref Range Status   Specimen Description BUTTOCKS  Final   Special Requests NONE  Final   Gram Stain   Final    NO WBC SEEN ABUNDANT GRAM  POSITIVE COCCI RARE GRAM NEGATIVE RODS RARE GRAM POSITIVE RODS Performed at [facility], [address], [place], [place] [address]    Culture   Final    ABUNDANT METHICILLIN RESISTANT STAPHYLOCOCCUS AUREUS   Report Status 02/03/2020 FINAL  Final   Organism ID, Bacteria METHICILLIN RESISTANT STAPHYLOCOCCUS AUREUS  Final      Susceptibility   Methicillin resistant staphylococcus aureus - MIC*    CIPROFLOXACIN >=8 RESISTANT Resistant     ERYTHROMYCIN >=8 RESISTANT Resistant     GENTAMICIN <=0.5 SENSITIVE Sensitive     OXACILLIN >=4 RESISTANT Resistant     TETRACYCLINE <=1 SENSITIVE Sensitive     VANCOMYCIN 1 SENSITIVE Sensitive     TRIMETH/SULFA <=10 SENSITIVE Sensitive     CLINDAMYCIN <=0.25 SENSITIVE Sensitive     RIFAMPIN <=0.5 SENSITIVE Sensitive     Inducible Clindamycin NEGATIVE Sensitive     * ABUNDANT METHICILLIN RESISTANT STAPHYLOCOCCUS AUREUS  Blood Culture (routine x 2)     Status: Abnormal   Collection Time: 02/01/20  4:48 AM   Specimen: BLOOD  Result Value Ref Range Status   Specimen Description BLOOD RIGHT ANTECUBITAL  Final   Special Requests   Final    BOTTLES DRAWN AEROBIC AND ANAEROBIC Blood Culture adequate volume   Culture  Setup Time   Final    GRAM POSITIVE COCCI IN CLUSTERS IN BOTH AEROBIC AND ANAEROBIC BOTTLES CRITICAL RESULT CALLED TO, READ BACK BY AND VERIFIED WITH: J. LEDFORD,PHARMD 0018 02/02/2020 Mena Goes Performed at [facility], [address] [place], [place] [address]    Culture METHICILLIN RESISTANT STAPHYLOCOCCUS AUREUS (A)  Final   Report Status 02/03/2020 FINAL  Final   Organism ID, Bacteria METHICILLIN RESISTANT STAPHYLOCOCCUS AUREUS  Final      Susceptibility   Methicillin resistant staphylococcus aureus - MIC*    CIPROFLOXACIN >=8 RESISTANT Resistant     ERYTHROMYCIN >=8 RESISTANT Resistant     GENTAMICIN <=0.5 SENSITIVE Sensitive     OXACILLIN >=4 RESISTANT Resistant     TETRACYCLINE <=1 SENSITIVE Sensitive      VANCOMYCIN 1 SENSITIVE Sensitive     TRIMETH/SULFA <=10 SENSITIVE Sensitive     CLINDAMYCIN <=0.25 SENSITIVE Sensitive     RIFAMPIN <=0.5 SENSITIVE Sensitive     Inducible Clindamycin NEGATIVE Sensitive     * METHICILLIN RESISTANT STAPHYLOCOCCUS AUREUS  Blood Culture ID Panel (Reflexed)     Status: Abnormal   Collection Time: 02/01/20  4:48 AM  Result Value Ref Range Status   Enterococcus faecalis NOT DETECTED NOT DETECTED Final   Enterococcus Faecium NOT DETECTED NOT DETECTED Final   Listeria monocytogenes NOT DETECTED NOT DETECTED Final   Staphylococcus species DETECTED (A) NOT DETECTED Final    Comment: CRITICAL RESULT CALLED TO, READ BACK BY AND VERIFIED WITH: J. LEDFORD,PHARMD 0018 02/02/2020 T. TYSOR    Staphylococcus aureus (BCID) DETECTED (A) NOT DETECTED Final    Comment: Methicillin (oxacillin)-resistant Staphylococcus aureus (MRSA). MRSA is predictably resistant to beta-lactam antibiotics (except ceftaroline). Preferred therapy is vancomycin unless clinically contraindicated. Patient requires contact precautions if  hospitalized. CRITICAL RESULT CALLED TO, READ BACK BY AND VERIFIED WITH: J. LEDFORD,PHARMD 0018 02/02/2020 T. TYSOR    Staphylococcus epidermidis NOT DETECTED NOT DETECTED Final   Staphylococcus lugdunensis NOT DETECTED NOT DETECTED Final   Streptococcus species NOT DETECTED NOT DETECTED Final   Streptococcus agalactiae NOT DETECTED NOT DETECTED Final   Streptococcus pneumoniae NOT DETECTED NOT DETECTED Final   Streptococcus pyogenes NOT DETECTED NOT DETECTED Final   A.calcoaceticus-baumannii NOT DETECTED NOT DETECTED Final   Bacteroides fragilis NOT DETECTED NOT DETECTED Final   Enterobacterales NOT DETECTED NOT DETECTED Final   Enterobacter cloacae complex NOT DETECTED NOT DETECTED Final   Escherichia coli NOT DETECTED NOT DETECTED Final   Klebsiella aerogenes NOT DETECTED NOT DETECTED Final   Klebsiella oxytoca NOT DETECTED NOT DETECTED Final   Klebsiella  pneumoniae NOT DETECTED NOT DETECTED Final   Proteus species NOT DETECTED NOT DETECTED Final   Salmonella species NOT DETECTED NOT DETECTED Final   Serratia marcescens NOT DETECTED NOT DETECTED Final   Haemophilus influenzae NOT DETECTED NOT DETECTED Final   Neisseria meningitidis NOT DETECTED NOT DETECTED Final   Pseudomonas aeruginosa NOT DETECTED NOT DETECTED Final   Stenotrophomonas maltophilia NOT DETECTED NOT DETECTED Final   Candida albicans NOT DETECTED NOT DETECTED Final   Candida auris NOT DETECTED NOT DETECTED Final   Candida glabrata NOT DETECTED NOT DETECTED Final   Candida krusei NOT DETECTED NOT DETECTED Final   Candida parapsilosis NOT DETECTED NOT DETECTED Final   Candida tropicalis NOT DETECTED NOT DETECTED Final   Cryptococcus neoformans/gattii NOT DETECTED NOT DETECTED Final   Meth resistant mecA/C and MREJ DETECTED (A) NOT DETECTED Final    Comment: CRITICAL RESULT CALLED TO, READ BACK BY AND VERIFIED WITH: J. LEDFORD,PHARMD 0018 02/02/2020 T. TYSOR Performed at [facility], [address] [place], [place] [address]   Aerobic/Anaerobic Culture (surgical/deep wound)     Status: None   Collection Time: 02/01/20 11:31 AM   Specimen:  Buttocks; Abscess  Result Value Ref Range Status   Specimen Description ABSCESS  Final   Special Requests BUTTOCKS  Final   Gram Stain   Final    RARE WBC PRESENT, PREDOMINANTLY PMN FEW GRAM POSITIVE COCCI    Culture   Final    MODERATE METHICILLIN RESISTANT STAPHYLOCOCCUS AUREUS NO ANAEROBES ISOLATED Performed at [facility], [address] [place], [place] [address]    Report Status 02/06/2020 FINAL  Final   Organism ID, Bacteria METHICILLIN RESISTANT STAPHYLOCOCCUS AUREUS  Final      Susceptibility   Methicillin resistant staphylococcus aureus - MIC*    CIPROFLOXACIN >=8 RESISTANT Resistant     ERYTHROMYCIN >=8 RESISTANT Resistant     GENTAMICIN <=0.5 SENSITIVE Sensitive     OXACILLIN >=4 RESISTANT Resistant      TETRACYCLINE <=1 SENSITIVE Sensitive     VANCOMYCIN 1 SENSITIVE Sensitive     TRIMETH/SULFA <=10 SENSITIVE Sensitive     CLINDAMYCIN <=0.25 SENSITIVE Sensitive     RIFAMPIN <=0.5 SENSITIVE Sensitive     Inducible Clindamycin NEGATIVE Sensitive     * MODERATE METHICILLIN RESISTANT STAPHYLOCOCCUS AUREUS  Urine culture     Status: Abnormal (Preliminary result)   Collection Time: 02/02/20  5:05 AM   Specimen: In/Out Cath Urine  Result Value Ref Range Status   Specimen Description IN/OUT CATH URINE  Final   Special Requests NONE  Final   Culture (A)  Final    400 COLONIES/mL GRAM NEGATIVE RODS Sent to [facility] for further susceptibility testing. AND IDENTIFICATION Performed at [facility], [place] [address] [place], [place] [address]    Report Status PENDING  Incomplete  MRSA PCR Screening     Status: None   Collection Time: 02/02/20  5:16 AM   Specimen: Nasal Mucosa; Nasopharyngeal  Result Value Ref Range Status   MRSA by PCR NEGATIVE NEGATIVE Final    Comment:        The GeneXpert MRSA Assay (FDA approved for NASAL specimens only), is one component of a comprehensive MRSA colonization surveillance program. It is not intended to diagnose MRSA infection nor to guide or monitor treatment for MRSA infections. Performed at [facility], [place] [address] [place], [place] [address]   Culture, blood (routine x 2)     Status: None   Collection Time: 02/03/20  4:02 AM   Specimen: BLOOD  Result Value Ref Range Status   Specimen Description BLOOD SITE NOT SPECIFIED  Final   Special Requests   Final    BOTTLES DRAWN AEROBIC ONLY Blood Culture results may not be optimal due to an inadequate volume of blood received in culture bottles   Culture   Final    NO GROWTH 5 DAYS Performed at [facility], [place] [address], [place], [place] [address]    Report Status 02/08/2020 FINAL  Final  Culture, blood (routine x 2)     Status: None   Collection Time: 02/03/20  4:13 AM    Specimen: BLOOD  Result Value Ref Range Status   Specimen Description BLOOD SITE NOT SPECIFIED  Final   Special Requests   Final    BOTTLES DRAWN AEROBIC ONLY Blood Culture results may not be optimal due to an inadequate volume of blood received in culture bottles   Culture   Final    NO GROWTH 5 DAYS Performed at [facility], [place] [address], [place], [place] [address]    Report Status 02/08/2020 FINAL  Final    Radiology Reports CT ABDOMEN PELVIS WO CONTRAST  Result Date: 02/01/2020 CLINICAL DATA:  Abdominal abscess/infection suspected Nausea. Patient reports decreased appetite. Patient reports buttock abscess. EXAM: CT ABDOMEN AND PELVIS WITHOUT CONTRAST TECHNIQUE: Multidetector CT imaging of the abdomen and pelvis was performed following the standard protocol without IV contrast. COMPARISON:  CT 03/06/2017 FINDINGS: Lower chest: Minimal subpleural scarring in the left lower lobe. No acute airspace disease. No pleural effusion. Hepatobiliary: No focal hepatic abnormality on noncontrast exam. Multiple calcified gallstones without pericholecystic inflammation. There is no biliary dilatation. Pancreas: No ductal dilatation or inflammation. Spleen: Normal in size. No focal abnormality on noncontrast exam. Coils again seen in the splenic artery. Adrenals/Urinary Tract: Previous left adrenal hematoma has resolved, there is mild residual adrenal thickening. Normal right adrenal gland. Mild left hydronephrosis and hydroureter to the level of the distal pelvis. No stone or cause for obstruction. Mild prominence of the right ureter without hydronephrosis. There is symmetric bilateral perinephric edema. Urinary bladder is minimally distended. Stomach/Bowel: Small hiatal hernia. Stomach otherwise unremarkable. There is no bowel obstruction. No evidence of bowel wall thickening or inflammation. Few lower pelvic small bowel loops are fluid-filled but nondilated or inflamed. Normal appendix. High-riding  cecum. Moderate volume of colonic stool. There is colonic tortuosity. No colonic wall thickening or inflammation. Vascular/Lymphatic: Abdominal aorta is normal in caliber. Coiling of the splenic artery. Multiple small retroperitoneal lymph nodes, not enlarged by size criteria and likely reactive. Small bilateral inguinal nodes. Reproductive: Prostate unremarkable. Right testis may be within the inguinal canal, series 3, image 86. Other: No ascites or free air. Subcutaneous fluid collection involving the left gluteal soft tissues about the gluteal crease measuring 2.8 x 1.4 x 4.1 cm. Mild adjacent soft tissue edema tracks into the gluteal fold. Fluid collection in the inferior right gluteal soft tissues abutting the gluteal crease measures 3.0 x 2.4 x 3.8 cm. There is no air within either fluid collection or subcutaneous soft tissues. No extension into the anorectum. Musculoskeletal: Bilateral sacroiliac joint pinning and pubic symphyseal fixation. Remote left transverse process fractures in the lumbar spine. Remote left rib fractures. No acute osseous abnormalities. IMPRESSION: 1. Two gluteal fluid collections, 1 on the right and 1 on the left. Subcutaneous fluid collection involving the left gluteal soft tissues abut the gluteal crease measuring 2.8 x 1.4 x 4.1 cm, suspicious for abscess. Fluid collection in the inferior right gluteal soft tissues abutting the gluteal crease measures 3.0 x 2.4 x 3.8 cm. No air within either fluid collection or subcutaneous soft tissues. 2. Mild left hydronephrosis and hydroureter to the level of the distal pelvis. No stone or cause for obstruction. Bilateral perinephric edema, nonspecific. Decompressed urinary bladder which appears thick walled. 3. Cholelithiasis without gallbladder inflammation. 4. Small hiatal hernia. 5. Right testis may be within the inguinal canal. Recommend correlation with physical exam. Electronically Signed   By: Keith Rake M.D.   On: 02/01/2020 03:39    US RENAL  Result Date: 02/03/2020 CLINICAL DATA:  Acute kidney injury. EXAM: RENAL / URINARY TRACT ULTRASOUND COMPLETE COMPARISON:  CT 02/01/2020, renal ultrasound 01/03/2020 FINDINGS: Right Kidney: Renal measurements: 11.7 x 5.6 x 5.3 cm = volume: 180 mL. Mild increased renal parenchymal echogenicity. No mass or hydronephrosis visualized. Left Kidney: Renal measurements: 10.1 x 5.3 x 5.2 cm = volume: 146 mL. Mild hydronephrosis. Renal pelvis measures 12 mm in dimension. Mild increased renal echogenicity. No evidence of focal lesion. Bladder: Appears normal for degree of bladder distention. Other: Incidental  gallstones. IMPRESSION: 1. Mild left hydronephrosis. This is unchanged from prior CT as well as renal ultrasound last month. Cause for obstruction of stab list. 2. Mild increased renal echogenicity consistent with chronic medical renal disease. Electronically Signed   By: Keith Rake M.D.   On: 02/03/2020 23:07   IR Fluoro Guide CV Line Right  Result Date: 02/05/2020 INDICATION: MRSA bacteremia, cellulitis, access for long-term antibiotics EXAM: TUNNELED PICC LINE WITH ULTRASOUND AND FLUOROSCOPIC GUIDANCE MEDICATIONS: 1% lidocaine local. ANESTHESIA/SEDATION: Moderate Sedation Time:  None. The patient was continuously monitored during the procedure by the interventional radiology nurse under my direct supervision. FLUOROSCOPY TIME:  Fluoroscopy Time: 0 minutes 6 seconds (1 mGy). COMPLICATIONS: None immediate. PROCEDURE: Informed written consent was obtained from the patient after a discussion of the risks, benefits, and alternatives to treatment. Questions regarding the procedure were encouraged and answered. The right neck and chest were prepped with chlorhexidine in a sterile fashion, and a sterile drape was applied covering the operative field. Maximum barrier sterile technique with sterile gowns and gloves were used for the procedure. A timeout was performed prior to the initiation of the  procedure. After creating a small venotomy incision, a micropuncture kit was utilized to access the right internal jugular vein under direct, real-time ultrasound guidance after the overlying soft tissues were anesthetized with 1% lidocaine with epinephrine. Ultrasound image documentation was performed. The microwire was kinked to measure appropriate catheter length. The micropuncture sheath was exchanged for a peel-away sheath over a guidewire. A 5 French dual lumen tunneled PICC measuring 25 cm was tunneled in a retrograde fashion from the anterior chest wall to the venotomy incision. The catheter was then placed through the peel-away sheath with tip ultimately positioned at the superior caval-atrial junction. Final catheter positioning was confirmed and documented with a spot radiographic image. The catheter aspirates and flushes normally. The catheter was flushed with appropriate volume heparin dwells. The catheter exit site was secured with a 3 0 Ethilon retention suture. The venotomy incision was closed with Dermabond. Dressings were applied. The patient tolerated the procedure well without immediate post procedural complication. FINDINGS: After catheter placement, the tip lies within the superior cavoatrial junction. The catheter aspirates and flushes normally and is ready for immediate use. IMPRESSION: Successful placement of 25cm dual lumen tunneled PICC catheter via the right internal jugular vein with tip terminating at the superior caval atrial junction. The catheter is ready for immediate use. Electronically Signed   By: Jerilynn Mages.  Shick M.D.   On: 02/05/2020 16:09   IR US Guide Vasc Access Right  Result Date: 02/05/2020 INDICATION: MRSA bacteremia, cellulitis, access for long-term antibiotics EXAM: TUNNELED PICC LINE WITH ULTRASOUND AND FLUOROSCOPIC GUIDANCE MEDICATIONS: 1% lidocaine local. ANESTHESIA/SEDATION: Moderate Sedation Time:  None. The patient was continuously monitored during the procedure by  the interventional radiology nurse under my direct supervision. FLUOROSCOPY TIME:  Fluoroscopy Time: 0 minutes 6 seconds (1 mGy). COMPLICATIONS: None immediate. PROCEDURE: Informed written consent was obtained from the patient after a discussion of the risks, benefits, and alternatives to treatment. Questions regarding the procedure were encouraged and answered. The right neck and chest were prepped with chlorhexidine in a sterile fashion, and a sterile drape was applied covering the operative field. Maximum barrier sterile technique with sterile gowns and gloves were used for the procedure. A timeout was performed prior to the initiation of the procedure. After creating a small venotomy incision, a micropuncture kit was utilized to access the right internal jugular vein under direct, real-time  ultrasound guidance after the overlying soft tissues were anesthetized with 1% lidocaine with epinephrine. Ultrasound image documentation was performed. The microwire was kinked to measure appropriate catheter length. The micropuncture sheath was exchanged for a peel-away sheath over a guidewire. A 5 French dual lumen tunneled PICC measuring 25 cm was tunneled in a retrograde fashion from the anterior chest wall to the venotomy incision. The catheter was then placed through the peel-away sheath with tip ultimately positioned at the superior caval-atrial junction. Final catheter positioning was confirmed and documented with a spot radiographic image. The catheter aspirates and flushes normally. The catheter was flushed with appropriate volume heparin dwells. The catheter exit site was secured with a 3 0 Ethilon retention suture. The venotomy incision was closed with Dermabond. Dressings were applied. The patient tolerated the procedure well without immediate post procedural complication. FINDINGS: After catheter placement, the tip lies within the superior cavoatrial junction. The catheter aspirates and flushes normally and is  ready for immediate use. IMPRESSION: Successful placement of 25cm dual lumen tunneled PICC catheter via the right internal jugular vein with tip terminating at the superior caval atrial junction. The catheter is ready for immediate use. Electronically Signed   By: Jerilynn Mages.  Shick M.D.   On: 02/05/2020 16:09   DG Chest Portable 1 View  Result Date: 02/01/2020 CLINICAL DATA:  Cough. EXAM: PORTABLE CHEST 1 VIEW COMPARISON:  Radiograph 04/01/2017 FINDINGS: Heart is normal in size. Normal mediastinal contours. Remote left rib fractures with minor scarring at the left lung base. No acute or confluent airspace disease. No pleural fluid or pneumothorax. No pulmonary edema. Left upper quadrant vascular coils. IMPRESSION: 1. No acute abnormality. 2. Remote left rib fractures with minor scarring at the left lung base. Electronically Signed   By: Keith Rake M.D.   On: 02/01/2020 03:41   ECHOCARDIOGRAM COMPLETE  Result Date: 02/02/2020    ECHOCARDIOGRAM REPORT   Patient Name:   KAVI ALMQUIST Date of Exam: 02/02/2020 Medical Rec #:  [id number]        Height:       70.0 in Accession #:    [id number]       Weight:       159.4 lb Date of Birth:  1960/10/13       BSA:          1.895 m Patient Age:    61 years         BP:           142/84 mmHg Patient Gender: M                HR:           84 bpm. Exam Location:  Inpatient Procedure: 2D Echo Indications:    bacteremia  History:        Patient has no prior history of Echocardiogram examinations.                 Risk Factors:Diabetes and Hypertension.  Sonographer:    Jannett Celestine RDCS (AE) Referring Phys: [place]  1. There is a linear independently mobile echodensity in the LVOT (see image 30). This cannot be clearly traced back to a structure, but aortic valve endocarditis not excluded. Left ventricular ejection fraction, by estimation, is 65 to 70%. The left ventricle has normal function. The left ventricle has no regional wall motion abnormalities.  Left ventricular diastolic parameters were normal.  2. Right ventricular systolic function is normal. The right ventricular size  is normal. Tricuspid regurgitation signal is inadequate for assessing PA pressure.  3. The mitral valve is normal in structure. Trivial mitral valve regurgitation. No evidence of mitral stenosis.  4. Bicuspid aortic valve, with fusion of the RCC and LCC (image 20) with focal calcification. Mild AI. Marland Kitchen The aortic valve is bicuspid. Aortic valve regurgitation is mild. Mild aortic valve sclerosis is present, with no evidence of aortic valve stenosis.  5. The inferior vena cava is normal in size with greater than 50% respiratory variability, suggesting right atrial pressure of 3 mmHg. Comparison(s): No prior Echocardiogram. Conclusion(s)/Recommendation(s): Bicuspid aortic valve. There is an independently mobile linear echodensity seen in the LVOT. Given bacteremia, would recommend TEE to further evaluate for endocarditis. FINDINGS  Left Ventricle: There is a linear independently mobile echodensity in the LVOT (see image 30). This cannot be clearly traced back to a structure, but aortic valve endocarditis not excluded. Left ventricular ejection fraction, by estimation, is 65 to 70%. The left ventricle has normal function. The left ventricle has no regional wall motion abnormalities. The left ventricular internal cavity size was normal in size. There is borderline left ventricular hypertrophy. Left ventricular diastolic parameters were normal. Right Ventricle: The right ventricular size is normal. No increase in right ventricular wall thickness. Right ventricular systolic function is normal. Tricuspid regurgitation signal is inadequate for assessing PA pressure. Left Atrium: Left atrial size was normal in size. Right Atrium: Right atrial size was normal in size. Pericardium: There is no evidence of pericardial effusion. Mitral Valve: The mitral valve is normal in structure. Trivial mitral valve  regurgitation. No evidence of mitral valve stenosis. Tricuspid Valve: The tricuspid valve is normal in structure. Tricuspid valve regurgitation is trivial. No evidence of tricuspid stenosis. Aortic Valve: Bicuspid aortic valve, with fusion of the RCC and LCC (image 20) with focal calcification. Mild AI. The aortic valve is bicuspid. . There is mild thickening and mild calcification of the aortic valve. Aortic valve regurgitation is mild. Mild aortic valve sclerosis is present, with no evidence of aortic valve stenosis. There is mild thickening of the aortic valve. There is mild calcification of the aortic valve. Pulmonic Valve: The pulmonic valve was grossly normal. Pulmonic valve regurgitation is trivial. Aorta: The aortic root was not well visualized, the ascending aorta was not well visualized and the aortic arch was not well visualized. Venous: The inferior vena cava is normal in size with greater than 50% respiratory variability, suggesting right atrial pressure of 3 mmHg. IAS/Shunts: The atrial septum is grossly normal.  LEFT VENTRICLE PLAX 2D LVIDd:         4.70 cm  Diastology LVIDs:         2.80 cm  LV e' lateral:   7.62 cm/s LV PW:         1.10 cm  LV E/e' lateral: 9.7 LV IVS:        1.20 cm  LV e' medial:    5.98 cm/s LVOT diam:     2.20 cm  LV E/e' medial:  12.4 LV SV:         87 LV SV Index:   46 LVOT Area:     3.80 cm  RIGHT VENTRICLE RV S prime:     9.46 cm/s TAPSE (M-mode): 1.7 cm LEFT ATRIUM           Index       RIGHT ATRIUM          Index LA diam:  3.00 cm 1.58 cm/m  RA Area:     8.89 cm LA Vol (A2C): 20.6 ml 10.87 ml/m RA Volume:   15.00 ml 7.91 ml/m LA Vol (A4C): 21.9 ml 11.55 ml/m  AORTIC VALVE LVOT Vmax:   95.30 cm/s LVOT Vmean:  71.000 cm/s LVOT VTI:    0.229 m  AORTA Ao Root diam: 3.60 cm MITRAL VALVE MV Area (PHT): 3.27 cm     SHUNTS MV Decel Time: 232 msec     Systemic VTI:  0.23 m MV E velocity: 74.10 cm/s   Systemic Diam: 2.20 cm MV A velocity: 102.00 cm/s MV E/A ratio:  0.73  Buford Dresser MD Electronically signed by Buford Dresser MD Signature Date/Time: 02/02/2020/4:39:02 PM    Final    ECHO TEE  Result Date: 02/04/2020    TRANSESOPHOGEAL ECHO REPORT   Patient Name:   ROHN FRITSCH Date of Exam: 02/04/2020 Medical Rec #:  [id number]        Height:       70.0 in Accession #:    [id number]       Weight:       159.4 lb Date of Birth:  [birth year]       BSA:          1.895 m Patient Age:    41 years         BP:           156/81 mmHg Patient Gender: M                HR:           64 bpm. Exam Location:  Inpatient Procedure: Transesophageal Echo and Color Doppler Indications:     Bacteremia  History:         Patient has prior history of Echocardiogram examinations, most                  recent 02/02/2020. Risk Factors:Diabetes, Hypertension and                  Dyslipidemia. CKD.  Sonographer:     Clayton Lefort RDCS (AE) Referring Phys:  [id number] [place] Diagnosing Phys: Buford Dresser MD PROCEDURE: After discussion of the risks and benefits of a TEE, an informed consent was obtained from the patient. The transesophogeal probe was passed without difficulty through the esophogus of the patient. Local oropharyngeal anesthetic was provided with viscous lidocaine. Sedation performed by different physician. The patient was monitored while under deep sedation. Anesthestetic sedation was provided intravenously by Anesthesiology: 139.3m of Propofol. Image quality was good. The patient's vital signs; including heart rate, blood pressure, and oxygen saturation; remained stable throughout the procedure. The patient developed no complications during the procedure. IMPRESSIONS  1. Left ventricular ejection fraction, by estimation, is 65 to 70%. The left ventricle has normal function. The left ventricle has no regional wall motion abnormalities.  2. Right ventricular systolic function is normal. The right ventricular size is normal.  3. No left atrial/left atrial appendage  thrombus was detected.  4. The mitral valve is normal in structure. Trivial mitral valve regurgitation. No evidence of mitral stenosis.  5. The aortic valve is bicuspid. Aortic valve regurgitation is trivial. No aortic stenosis is present.  6. There is mild (Grade II) plaque involving the descending aorta. Conclusion(s)/Recommendation(s): No evidence of endocarditis. Bicuspid aortic valve. Structure seen on TTE is associated with the mitral valve, not the aortic, near the mitral-aortic annular junction. This appears to be  a chordae, not consistent with vegetation. FINDINGS  Left Ventricle: Left ventricular ejection fraction, by estimation, is 65 to 70%. The left ventricle has normal function. The left ventricle has no regional wall motion abnormalities. The left ventricular internal cavity size was normal in size. There is  borderline left ventricular hypertrophy. Right Ventricle: The right ventricular size is normal. No increase in right ventricular wall thickness. Right ventricular systolic function is normal. Left Atrium: Left atrial size was not assessed. No left atrial/left atrial appendage thrombus was detected. Right Atrium: Right atrial size was not assessed. Pericardium: There is no evidence of pericardial effusion. Mitral Valve: The mitral valve is normal in structure. Trivial mitral valve regurgitation. No evidence of mitral valve stenosis. Tricuspid Valve: The tricuspid valve is normal in structure. Tricuspid valve regurgitation is trivial. No evidence of tricuspid stenosis. Aortic Valve: The aortic valve is bicuspid. Aortic valve regurgitation is trivial. No aortic stenosis is present. Pulmonic Valve: The pulmonic valve was grossly normal. Pulmonic valve regurgitation is trivial. Aorta: The aortic root is normal in size and structure. There is mild (Grade II) plaque involving the descending aorta. IAS/Shunts: No atrial level shunt detected by color flow Doppler. Buford Dresser MD Electronically  signed by Buford Dresser MD Signature Date/Time: 02/04/2020/5:29:42 PM    Final      Time Spent in minutes  30     Desiree Hane M.D on 02/09/2020 at 9:08 AM  To page go to [email or web address] - password [facility]

## 2020-02-09 NOTE — Accreditation Note (Signed)
CRITICAL VALUE ALERT  Critical Value:  K 6.3  Date & Time Notied: 02/09/2020  9:49 AM   Provider Notified: Dr. Lisbeth Ply paged  Orders Received/Actions taken: Orders received

## 2020-02-10 LAB — BASIC METABOLIC PANEL
Anion gap: 11 (ref 5–15)
BUN: 72 mg/dL — ABNORMAL HIGH (ref 6–20)
CO2: 22 mmol/L (ref 22–32)
Calcium: 8.2 mg/dL — ABNORMAL LOW (ref 8.9–10.3)
Chloride: 102 mmol/L (ref 98–111)
Creatinine, Ser: 4.38 mg/dL — ABNORMAL HIGH (ref 0.61–1.24)
GFR calc Af Amer: 16 mL/min — ABNORMAL LOW (ref >60)
GFR calc non Af Amer: 14 mL/min — ABNORMAL LOW (ref >60)
Glucose, Bld: 176 mg/dL — ABNORMAL HIGH (ref 70–99)
Potassium: 5.5 mmol/L — ABNORMAL HIGH (ref 3.5–5.1)
Sodium: 135 mmol/L (ref 135–145)

## 2020-02-10 LAB — GLUCOSE, CAPILLARY
Glucose-Capillary: 238 mg/dL — ABNORMAL HIGH (ref 70–99)
Glucose-Capillary: 160 mg/dL — ABNORMAL HIGH (ref 70–99)
Glucose-Capillary: 95 mg/dL (ref 70–99)
Glucose-Capillary: 252 mg/dL — ABNORMAL HIGH (ref 70–99)
Glucose-Capillary: 117 mg/dL — ABNORMAL HIGH (ref 70–99)

## 2020-02-10 LAB — CBC
HCT: 24 % — ABNORMAL LOW (ref 39.0–52.0)
Hemoglobin: 7.5 g/dL — ABNORMAL LOW (ref 13.0–17.0)
MCH: 29.3 pg (ref 26.0–34.0)
MCHC: 31.3 g/dL (ref 30.0–36.0)
MCV: 93.8 fL (ref 80.0–100.0)
Platelets: UNDETERMINED 10*3/uL (ref 150–400)
RBC: 2.56 MIL/uL — ABNORMAL LOW (ref 4.22–5.81)
RDW: 12.6 % (ref 11.5–15.5)
WBC: 6.8 10*3/uL (ref 4.0–10.5)
nRBC: 0 % (ref 0.0–0.2)

## 2020-02-10 LAB — CK: Total CK: 100 U/L (ref 49–397)

## 2020-02-10 LAB — IRON AND TIBC
Iron: 65 ug/dL (ref 45–182)
Saturation Ratios: 26 % (ref 17.9–39.5)
TIBC: 251 ug/dL (ref 250–450)
UIBC: 186 ug/dL

## 2020-02-10 LAB — FERRITIN: Ferritin: 473 ng/mL — ABNORMAL HIGH (ref 24–336)

## 2020-02-10 MED ORDER — SODIUM ZIRCONIUM CYCLOSILICATE 10 G PO PACK: 10.0000 g | PACK | Freq: Every day | ORAL | Status: DC

## 2020-02-10 MED ORDER — SODIUM ZIRCONIUM CYCLOSILICATE 10 G PO PACK
10.0000 g | PACK | Freq: Every day | ORAL | Status: DC
  Administered 2020-02-10: 10 g via ORAL
  Filled 2020-02-10: qty 1

## 2020-02-10 NOTE — Progress Notes (Signed)
Pharmacy Antibiotic Note  Author Hatlestad is a 59 y.o. male admitted on 01/31/2020 with cellulitis and abscess of buttock. Patient was started on vancomycin and meropenem, and went to OR for I&D and debridement of wound on 8/14. Patient subsequently found to have MRSA bacteremia, and wound cultures also growing Staph aureus (peliminary result).  Pharmacy has been consulted to switch antibiotics to daptomycin due to unstable renal function.  Patient with AKI. Unclear what current baseline is as last SCr before admission was in 2018. SCr has trended up to 4.38 with CrCl ~ 18 ml/min.   CK WNL at 100; off Zocor due to increased risk of myopathy and rhabdomyolysis.   Plan: Continue Daptomycin 600 mg every 48 hours (~8 mg/kg)  CK qMon Monitor renal fxn, mico data, clinical progres  Height: 5\' 10"  (177.8 cm) Weight: 72.3 kg (159 lb 6.3 oz) IBW/kg (Calculated) : 73  Temp (24hrs), Avg:98.1 F (36.7 C), Min:97.6 F (36.4 C), Max:98.7 F (37.1 C)  Recent Labs  Lab 02/04/20 0319 02/04/20 1240 02/05/20 0959 02/05/20 0959 02/06/20 0354 02/06/20 0354 02/07/20 0337 02/07/20 1508 02/07/20 1951 02/08/20 0842 02/09/20 0201 02/09/20 0852 02/10/20 0801  WBC 11.1*  --  10.8*  --  11.0*  --  10.1  --   --   --   --  9.4  --   CREATININE  --    < > 3.82*   < > 3.84*   < > 3.99*   < > 3.93* 3.91* 4.18* 4.30* 4.38*   < > = values in this interval not displayed.    Estimated Creatinine Clearance: 18.6 mL/min (A) (by C-G formula based on SCr of 4.38 mg/dL (H)).    No Known Allergies  Vanc x1 8/14 Merrem 8/14 >> 8/15 Cefepime x1 8/14 Daptomycin 8/15 >>  8/15 VL = 19 mcg/mL 8/16 CK - 161  8/14 buttocks abscess - Staph aureus - MRSA 8/14 buttocks - Staph aureus MRSA 8/14 BCx - MRSA 8/15 MRSA PCR - negative 8/16 BCx - no growth to date   Alanda Slim, PharmD, [facility] Clinical Pharmacist Please see AMION for all Pharmacists' Contact Phone Numbers 02/10/2020, 9:30 AM

## 2020-02-10 NOTE — Progress Notes (Signed)
Physical Therapy Wound Treatment Patient Details  Name: Peter [name] MRN: [id number] Date of Birth: 30-Nov-1960  Today's Date: 02/10/2020 Time: 1121-1217 Time Calculation (min): 56 min  Subjective  Subjective: Pt reports no changes with wounds over the weekenc Patient and Family Stated Goals: heal wounds Date of Onset: 02/03/20 Prior Treatments: topical treatment  Pain Score:  Pt with only occasional grimace during session, reports no pain over all  Wound Assessment  Wound / Incision (Open or Dehisced) 02/04/20 Incision - Open Buttocks Right Right superior buttock wound  (Active)  Wound Image   02/04/20 1300  Dressing Type Gauze (Comment);Mesh briefs;Normal saline moist dressing 02/10/20 1500  Dressing Changed Changed 02/10/20 1500  Dressing Status Clean;Dry;Intact 02/10/20 1500  Dressing Change Frequency Twice a day 02/10/20 1500  Site / Wound Assessment Red;Yellow;Granulation tissue 02/10/20 1500  % Wound base Red or Granulating 70% 02/10/20 1500  % Wound base Yellow/Fibrinous Exudate 30% 02/10/20 1500  % Wound base Black/Eschar 10% 02/06/20 1257  Peri-wound Assessment Intact 02/08/20 1125  Wound Length (cm) 3.2 cm 02/04/20 1300  Wound Width (cm) 2.4 cm 02/04/20 1300  Wound Depth (cm) 1.9 cm 02/04/20 1300  Wound Volume (cm^3) 14.59 cm^3 02/04/20 1300  Wound Surface Area (cm^2) 7.68 cm^2 02/04/20 1300  Margins Unattached edges (unapproximated) 02/10/20 1500  Closure None 02/10/20 1500  Drainage Amount Minimal 02/10/20 1500  Drainage Description Serosanguineous 02/10/20 1500  Non-staged Wound Description Full thickness 02/10/20 1500  Treatment Debridement (Selective);Hydrotherapy (Pulse lavage);Packing (Saline gauze) 02/10/20 1500     Wound / Incision (Open or Dehisced) 02/04/20 Incision - Open Buttocks Right R central buttock incision  (Active)  Wound Image   02/04/20 1300  Dressing Type Barrier Film (skin prep);Gauze (Comment);Normal saline moist dressing;Mesh briefs  02/10/20 1500  Dressing Changed Changed 02/10/20 1500  Dressing Status Clean;Dry;Intact 02/10/20 1500  Dressing Change Frequency Twice a day 02/10/20 1500  Site / Wound Assessment Pink;Yellow;Granulation tissue 02/10/20 1500  % Wound base Red or Granulating 70% 02/10/20 1500  % Wound base Yellow/Fibrinous Exudate 30% 02/10/20 1500  % Wound base Black/Eschar 0% 02/10/20 1500  % Wound base Other/Granulation Tissue (Comment) 0% 02/10/20 1500  Peri-wound Assessment Erythema (blanchable) 02/10/20 1500  Wound Length (cm) 3.3 cm 02/04/20 1300  Wound Width (cm) 0.7 cm 02/04/20 1300  Wound Depth (cm) 1.2 cm 02/04/20 1300  Wound Volume (cm^3) 2.77 cm^3 02/04/20 1300  Wound Surface Area (cm^2) 2.31 cm^2 02/04/20 1300  Margins Unattached edges (unapproximated) 02/10/20 1500  Closure None 02/10/20 1500  Drainage Amount Minimal 02/10/20 1500  Drainage Description Serosanguineous 02/10/20 1500  Non-staged Wound Description Full thickness 02/10/20 1500  Treatment Debridement (Selective);Hydrotherapy (Pulse lavage);Packing (Saline gauze) 02/10/20 1500     Wound / Incision (Open or Dehisced) 02/04/20 Incision - Open Buttocks Right R inferior buttock main  (Active)  Wound Image   02/04/20 1300  Dressing Type Barrier Film (skin prep);Gauze (Comment);Mesh briefs;Normal saline moist dressing 02/10/20 1500  Dressing Changed Changed 02/10/20 1500  Dressing Status Clean;Dry;Intact 02/10/20 1500  Dressing Change Frequency Twice a day 02/10/20 1500  Site / Wound Assessment Yellow;Friable;Brown;Red;Granulation tissue 02/10/20 1500  % Wound base Red or Granulating 40% 02/10/20 1500  % Wound base Yellow/Fibrinous Exudate 60% 02/10/20 1500  % Wound base Black/Eschar 0% 02/10/20 1500  % Wound base Other/Granulation Tissue (Comment) 0% 02/10/20 1500  Peri-wound Assessment Erythema (blanchable);Induration 02/10/20 1500  Wound Length (cm) 5.2 cm 02/04/20 1300  Wound Width (cm) 3.3 cm 02/04/20 1300  Wound Depth (cm)  2.2 cm 02/04/20  1300  Wound Volume (cm^3) 37.75 cm^3 02/04/20 1300  Wound Surface Area (cm^2) 17.16 cm^2 02/04/20 1300  Margins Unattached edges (unapproximated) 02/10/20 1500  Closure None 02/10/20 1500  Drainage Amount Minimal 02/10/20 1500  Drainage Description Serosanguineous 02/10/20 1500  Non-staged Wound Description Full thickness 02/10/20 1500  Treatment Debridement (Selective);Hydrotherapy (Pulse lavage);Packing (Saline gauze) 02/10/20 1500     Wound / Incision (Open or Dehisced) 02/04/20 Incision - Open Buttocks Right R Inferior buttock satellite (will likely merge with R inferior buttock main) (Active)  Dressing Type Barrier Film (skin prep);Gauze (Comment);Mesh briefs;Normal saline moist dressing 02/10/20 1500  Dressing Changed Changed 02/09/20 2130  Dressing Status Clean;Dry;Intact 02/10/20 1500  Dressing Change Frequency Twice a day 02/10/20 1500  Site / Wound Assessment Pink;Yellow;Red 02/10/20 1500  % Wound base Red or Granulating 60% 02/10/20 1500  % Wound base Yellow/Fibrinous Exudate 40% 02/10/20 1500  % Wound base Black/Eschar 0% 02/10/20 1500  % Wound base Other/Granulation Tissue (Comment) 0% 02/10/20 1500  Peri-wound Assessment Erythema (blanchable);Pink 02/10/20 1500  Wound Length (cm) 1.1 cm 02/04/20 1300  Wound Width (cm) 0.8 cm 02/04/20 1300  Wound Depth (cm) 0 cm 02/04/20 1300  Wound Volume (cm^3) 0 cm^3 02/04/20 1300  Wound Surface Area (cm^2) 0.88 cm^2 02/04/20 1300  Margins Unattached edges (unapproximated) 02/10/20 1500  Closure None 02/10/20 1500  Drainage Amount Minimal 02/10/20 1500  Drainage Description Serosanguineous 02/10/20 1500  Non-staged Wound Description Full thickness 02/10/20 1500  Treatment Debridement (Selective);Packing (Saline gauze);Hydrotherapy (Pulse lavage) 02/10/20 1500     Wound / Incision (Open or Dehisced) 02/04/20 Incision - Open Buttocks Left L central buttock  (Active)  Wound Image   02/04/20 1400  Dressing Type Barrier  Film (skin prep);Gauze (Comment);Normal saline moist dressing;Mesh briefs 02/10/20 1500  Dressing Changed Changed 02/10/20 1500  Dressing Status Clean;Dry;Intact 02/10/20 1500  Dressing Change Frequency Twice a day 02/10/20 1500  Site / Wound Assessment Pink;Yellow;Red;Granulation tissue 02/10/20 1500  % Wound base Red or Granulating 40% 02/10/20 1500  % Wound base Yellow/Fibrinous Exudate 60% 02/10/20 1500  % Wound base Black/Eschar 0% 02/10/20 1500  Peri-wound Assessment Erythema (blanchable);Pink 02/10/20 1500  Wound Length (cm) 4.8 cm 02/04/20 1400  Wound Width (cm) 1.3 cm 02/04/20 1400  Wound Depth (cm) 2.4 cm 02/04/20 1400  Wound Volume (cm^3) 14.98 cm^3 02/04/20 1400  Wound Surface Area (cm^2) 6.24 cm^2 02/04/20 1400  Margins Unattached edges (unapproximated) 02/10/20 1500  Closure None 02/10/20 1500  Drainage Amount Minimal 02/10/20 1500  Drainage Description Serosanguineous 02/10/20 1500  Non-staged Wound Description Full thickness 02/10/20 1500  Treatment Debridement (Selective);Hydrotherapy (Pulse lavage);Packing (Saline gauze) 02/10/20 1500     Wound / Incision (Open or Dehisced) 02/04/20 Incision - Open Buttocks Left L inferior buttock  (Active)  Wound Image   02/04/20 1300  Dressing Type Barrier Film (skin prep);Gauze (Comment);Normal saline moist dressing;Mesh briefs 02/10/20 1500  Dressing Changed Changed 02/10/20 1500  Dressing Status Clean;Dry;Intact 02/10/20 1500  Dressing Change Frequency Twice a day 02/10/20 1500  Site / Wound Assessment Yellow;Pink;Red;Granulation tissue 02/10/20 1500  % Wound base Red or Granulating 60% 02/10/20 1500  % Wound base Yellow/Fibrinous Exudate 40% 02/10/20 1500  % Wound base Black/Eschar 0% 02/10/20 1500  % Wound base Other/Granulation Tissue (Comment) 0% 02/10/20 1500  Peri-wound Assessment Erythema (blanchable);Induration 02/10/20 1500  Wound Length (cm) 4.7 cm 02/04/20 1400  Wound Width (cm) 1.2 cm 02/04/20 1400  Wound Depth  (cm) 1.8 cm 02/04/20 1400  Wound Volume (cm^3) 10.15 cm^3 02/04/20 1400  Wound  Surface Area (cm^2) 5.64 cm^2 02/04/20 1400  Margins Unattached edges (unapproximated) 02/10/20 1500  Closure None 02/10/20 1500  Drainage Amount Minimal 02/10/20 1500  Drainage Description Serosanguineous 02/10/20 1500  Non-staged Wound Description Full thickness 02/10/20 1500  Treatment Debridement (Selective);Hydrotherapy (Pulse lavage);Packing (Saline gauze) 02/10/20 1500   Santyl applied to all wound beds    Hydrotherapy Pulsed lavage therapy - wound location: all buttock wounds Pulsed Lavage with Suction (psi):  (8-12) Pulsed Lavage with Suction - Normal Saline Used: 2000 mL Pulsed Lavage Tip: Tip with splash shield Selective Debridement Selective Debridement - Location: L and R buttock wounds  Selective Debridement - Tools Used: Forceps;Scalpel Selective Debridement - Tissue Removed: necrotic tissue (yellow, brown)   Wound Assessment and Plan  Wound Therapy - Assess/Plan/Recommendations Wound Therapy - Clinical Statement: Surgical PAs in room during session in agreement that wound is progressing. Inferior L and R buttock wounds still have some more extensive necrotic tissue and induration around the wounds. In agreement to continue hydro therapy for 2 more days and reassess for possible discharge home. Pt continues to benefit from pulse lavage and selective debridement to promote healing.  Wound Therapy - Functional Problem List: uncontrolled DM Factors Delaying/Impairing Wound Healing: Diabetes Mellitus;Altered sensation;Infection - systemic/local;Multiple medical problems Hydrotherapy Plan: Debridement;Dressing change;Pulsatile lavage with suction;Patient/family education Wound Therapy - Frequency: 6X / week Wound Therapy - Follow Up Recommendations: [place] Wound Plan: see above  Wound Therapy Goals- Improve the function of patient's integumentary system by progressing the wound(s)  through the phases of wound healing (inflammation - proliferation - remodeling) by: Decrease Necrotic Tissue to: 0 Decrease Necrotic Tissue - Progress: Progressing toward goal Increase Granulation Tissue to: 100 Increase Granulation Tissue - Progress: Progressing toward goal Goals/treatment plan/discharge plan were made with and agreed upon by patient/family: Yes Time For Goal Achievement: 7 days Wound Therapy - Potential for Goals: Fair  Goals will be updated until maximal potential achieved or discharge criteria met.  Discharge criteria: when goals achieved, discharge from hospital, MD decision/surgical intervention, no progress towards goals, refusal/missing three consecutive treatments without notification or medical reason.  GP    Dani Gobble. Migdalia Dk PT, DPT Acute Rehabilitation Services Pager [phone] Office [phone]  [place] 02/10/2020, 4:13 PM

## 2020-02-10 NOTE — Progress Notes (Signed)
[facility]  PROGRESS NOTE  Peter [name] OZD:[id number] DOB: Jan 08, 1961 DOA: 01/31/2020 PCP: Raelene Bott, MD Admit date - 01/31/2020   Admitting Physician Vernelle Emerald, MD  Outpatient Primary MD for the patient is Raelene Bott, MD  LOS - 9 Brief Narrative  Peter [name] is a 59 y.o. year old male with medical history significant for uncontrolled type 2 diabetes, diabetic nephropathy, HTN, HLD, CKD stage IV who presented on 8/13/21subjective chills, generalized weakness, nausea and vomiting in multiple draining ulcers of the buttocks for the past week and was found to have leukocytosis, AKI with creatinine of 6, and CT imaging consistent with gluteal fluid collections concerning for abscesses that required debridement by Dr. Lucia Gaskins with surgery on 8/14.  Patient was found to have MRSA bacteremia as well as staph aureus growing from cultures from the abscess.  Empiric vancomycin and meropenem were changed to daptomycin due to acute on chronic kidney disease and culture data with ID consultants assistance.  Patient underwent TTE and TEE which were negative for any signs of endocarditis.  Subjective  Had a BM yesterday.  Reports pain well controlled.  Tolerating diet.  Urinating okay. A & P   Deep tissue infection of buttocks with multiple abscesses complicated by MRSA bacteremia, stable.  Status post I&D on 8/14 by Dr. Lucia Gaskins.  Remains afebrile, white count stable at 11,000.  Patient still requiring daily hydrotherapy to help with necrotic tissue -Continue daptomycin x4 weeks per ID, outpatient follow-up arranged(weekly ck and BMP) -Daily PT hydrotherapy for necrotic tissue management, decrease to twice daily daily dressing changes, Santyl to wound edges and bases,  not ready for discharge yet per surgery -As needed pain control with oxycodone  AKI on CKD stage IV and persistent moderate hyperkalemia, K improve though still high currently 5.5.  Much improved from peak of 6.2  yesterday.  No peaked T waves on EKG, remains asymptomatic. Likely due to poor clearance from diminished kidney function.   Creatinine currently4.3, previous baseline of 4.4 (per care everywhere).  Still maintaining adequate output, no acute indication for dialysis.  renal ultrasound consistent with chronic medical renal disease, also mild left hydronephrosis unchanged from prior CT last month -Outpatient nephrology referral placed -Avoid nephrotoxins, monitor BMP and output -renal diet, low potassium -Continue chlorthalidone (hctz not great with GFR),  telemetry -- standing dose of lokelma 10 mg daily starting 8/23 --if hyperk remains persistent not improving despite medical therapies above will consult nephrology  Normocytic anemia, slightly worsening on admission hemoglobin 8.8-9.2.  Currently 7.6 with no signs or symptoms of bleeding.  Suspect anemia of chronic disease related to CKD -Monitor CBC -Check iron panel  Type 2 diabetes, A1c 7.4 -Holding home loperamide -Discontinued Lantus started here for relative hypoglycemia (80, 8/18), fasting blood sugar 110 this a.m. -Continue sliding scale as needed, monitor CBGs  HTN, blood pressure much improved - amlodipine 10 mg  -hydralazine 50 mg 3 times daily - chlothalidone 25 mg, help with BP and hyperkalemia. If BP does not improve can trial lasix 20-40 mg BID or torsemide  (discussed with nephrology)  Family Communication  : None  Code Status : Full  Disposition Plan  :  Patient is from home. Anticipated d/c date: 2 to 3 days. Barriers to d/c or necessity for inpatient status: Still requiring intensive wound care with daily PT hydrotherapy, need to make sure no recurrent need for surgical intervention , as well as close monitoring of hyperkalemia Consults  : Surgery, ID  Procedures  :  I&D on 8/14 by Dr. Lucia Gaskins  DVT Prophylaxis  : Heparin Lab Results  Component Value Date   PLT PLATELET CLUMPS NOTED ON SMEAR, UNABLE TO ESTIMATE  02/10/2020    Diet :  Diet Order            Diet renal/carb modified with fluid restriction Diet-HS Snack? Nothing; Fluid restriction: 1200 mL Fluid; Room service appropriate? Yes; Fluid consistency: Thin  Diet effective now                  Inpatient Medications Scheduled Meds: . amLODipine  10 mg Oral Daily  . chlorthalidone  25 mg Oral Daily  . collagenase   Topical TID  . heparin injection (subcutaneous)  5,000 Units Subcutaneous Q8H  . hydrALAZINE  50 mg Oral Q8H  . insulin aspart  0-15 Units Subcutaneous TID AC & HS  . sodium zirconium cyclosilicate  10 g Oral Daily   Continuous Infusions: . DAPTOmycin (CUBICIN)  IV 600 mg (02/08/20 2159)   PRN Meds:.acetaminophen **OR** acetaminophen, ondansetron **OR** ondansetron (ZOFRAN) IV, oxyCODONE-acetaminophen **OR** oxyCODONE-acetaminophen, polyethylene glycol, silver nitrate applicators  Antibiotics  :   Anti-infectives (From admission, onward)   Start     Dose/Rate Route Frequency Ordered Stop   02/04/20 2000  DAPTOmycin (CUBICIN) 600 mg in sodium chloride 0.9 % IVPB        600 mg 224 mL/hr over 30 Minutes Intravenous Every 48 hours 02/03/20 1426     02/02/20 1430  DAPTOmycin (CUBICIN) 500 mg in sodium chloride 0.9 % IVPB  Status:  Discontinued        500 mg 220 mL/hr over 30 Minutes Intravenous Every 48 hours 02/02/20 1332 02/03/20 1426   02/01/20 0800  meropenem (MERREM) 500 mg in sodium chloride 0.9 % 100 mL IVPB  Status:  Discontinued        500 mg 200 mL/hr over 30 Minutes Intravenous Every 12 hours 02/01/20 0656 02/02/20 1304   02/01/20 0655  vancomycin variable dose per unstable renal function (pharmacist dosing)  Status:  Discontinued         Does not apply See admin instructions 02/01/20 0656 02/02/20 1305   02/01/20 0300  ceFEPIme (MAXIPIME) 2 g in sodium chloride 0.9 % 100 mL IVPB        2 g 200 mL/hr over 30 Minutes Intravenous  Once 02/01/20 0252 02/01/20 0446   02/01/20 0300  vancomycin (VANCOREADY) IVPB  1500 mg/300 mL        1,500 mg 150 mL/hr over 120 Minutes Intravenous  Once 02/01/20 0252 02/01/20 0624       Objective   Vitals:   02/09/20 2115 02/10/20 0344 02/10/20 0958 02/10/20 1023  BP: 125/72 133/70 137/70 136/73  Pulse: 85 65  74  Resp: 18   18  Temp: 98.7 F (37.1 C) 97.9 F (36.6 C)  98 F (36.7 C)  TempSrc:  Oral  Oral  SpO2: 97% 100%  99%  Weight:      Height:        SpO2: 99 % O2 Flow Rate (L/min): 2 L/min  Wt Readings from Last 3 Encounters:  02/04/20 72.3 kg  11/20/19 75.3 kg  05/10/17 83.9 kg     Intake/Output Summary (Last 24 hours) at 02/10/2020 1105 Last data filed at 02/10/2020 0830 Gross per 24 hour  Intake 1120 ml  Output 1125 ml  Net -5 ml    Physical Exam:     Awake Alert, Oriented X 3, Normal affect No new  F.N deficits,  [place].AT, R chest wall port in place c/d/i Normal respiratory effort on room air, CTAB RRR,No Gallops,Rubs or new Murmurs,  +ve B.Sounds, Abd Soft, No tenderness, No rebound, guarding or rigidity.   I have personally reviewed the following:   Data Reviewed:  CBC Recent Labs  Lab 02/05/20 0959 02/06/20 0354 02/07/20 0337 02/09/20 0852 02/10/20 0801  WBC 10.8* 11.0* 10.1 9.4 6.8  HGB 7.6* 7.6* 7.6* 7.8* 7.5*  HCT 24.1* 24.0* 24.4* 25.5* 24.0*  PLT 348 360 351 322 PLATELET CLUMPS NOTED ON SMEAR, UNABLE TO ESTIMATE  MCV 94.1 93.4 93.8 94.1 93.8  MCH 29.7 29.6 29.2 28.8 29.3  MCHC 31.5 31.7 31.1 30.6 31.3  RDW 12.4 12.2 12.1 12.4 12.6  LYMPHSABS 1.8 2.1 2.2  --   --   MONOABS 0.8 0.8 0.7  --   --   EOSABS 0.2 0.3 0.2  --   --   BASOSABS 0.1 0.0 0.0  --   --     Chemistries  Recent Labs  Lab 02/07/20 1951 02/07/20 1951 02/08/20 0842 02/08/20 1158 02/08/20 2104 02/09/20 0201 02/09/20 0852 02/09/20 1502 02/10/20 0801  NA 138  --  138  --   --  137 136  --  135  K 5.8*   < > 6.2*   < > 5.7* 5.9* 6.3* 4.9 5.5*  CL 103  --  103  --   --  102 104  --  102  CO2 29  --  25  --   --  25 23  --  22   GLUCOSE 169*  --  164*  --   --  112* 157*  --  176*  BUN 57*  --  62*  --   --  69* 69*  --  72*  CREATININE 3.93*  --  3.91*  --   --  4.18* 4.30*  --  4.38*  CALCIUM 8.0*  --  8.2*  --   --  8.2* 8.4*  --  8.2*   < > = values in this interval not displayed.   ------------------------------------------------------------------------------------------------------------------ No results for input(s): CHOL, HDL, LDLCALC, TRIG, CHOLHDL, LDLDIRECT in the last 72 hours.  Lab Results  Component Value Date   HGBA1C 7.4 (H) 02/01/2020   ------------------------------------------------------------------------------------------------------------------ No results for input(s): TSH, T4TOTAL, T3FREE, THYROIDAB in the last 72 hours.  Invalid input(s): FREET3 ------------------------------------------------------------------------------------------------------------------ No results for input(s): VITAMINB12, FOLATE, FERRITIN, TIBC, IRON, RETICCTPCT in the last 72 hours.  Coagulation profile No results for input(s): INR, PROTIME in the last 168 hours.  No results for input(s): DDIMER in the last 72 hours.  Cardiac Enzymes No results for input(s): CKMB, TROPONINI, MYOGLOBIN in the last 168 hours.  Invalid input(s): CK ------------------------------------------------------------------------------------------------------------------ No results found for: BNP  Micro Results Recent Results (from the past 240 hour(s))  Blood Culture (routine x 2)     Status: None   Collection Time: 02/01/20  3:04 AM   Specimen: BLOOD RIGHT FOREARM  Result Value Ref Range Status   Specimen Description BLOOD RIGHT FOREARM  Final   Special Requests   Final    BOTTLES DRAWN AEROBIC AND ANAEROBIC Blood Culture adequate volume   Culture   Final    NO GROWTH 5 DAYS Performed at [facility], [address], [place], [place] [address]    Report Status 02/06/2020 FINAL  Final  SARS Coronavirus 2 by RT PCR  (hospital order, performed in [facility] hospital lab)  Status: None   Collection Time: 02/01/20  3:04 AM  Result Value Ref Range Status   SARS Coronavirus 2 NEGATIVE NEGATIVE Final    Comment: (NOTE) SARS-CoV-2 target nucleic acids are NOT DETECTED.  The SARS-CoV-2 RNA is generally detectable in upper and lower respiratory specimens during the acute phase of infection. The lowest concentration of SARS-CoV-2 viral copies this assay can detect is 250 copies / mL. A negative result does not preclude SARS-CoV-2 infection and should not be used as the sole basis for treatment or other patient management decisions.  A negative result may occur with improper specimen collection / handling, submission of specimen other than nasopharyngeal swab, presence of viral mutation(s) within the areas targeted by this assay, and inadequate number of viral copies (<250 copies / mL). A negative result must be combined with clinical observations, patient history, and epidemiological information.  Fact Sheet for Patients:   StrictlyIdeas.no  Fact Sheet for Healthcare Providers: BankingDealers.co.za  This test is not yet approved or  cleared by the [place] FDA and has been authorized for detection and/or diagnosis of SARS-CoV-2 by FDA under an Emergency Use Authorization (EUA).  This EUA will remain in effect (meaning this test can be used) for the duration of the COVID-19 declaration under Section 564(b)(1) of the Act, 21 U.S.C. section 360bbb-3(b)(1), unless the authorization is terminated or revoked sooner.  Performed at [facility], [place] [address], [place], [place] [address]   Wound or Superficial Culture     Status: None   Collection Time: 02/01/20  4:13 AM   Specimen: Buttocks; Wound  Result Value Ref Range Status   Specimen Description BUTTOCKS  Final   Special Requests NONE  Final   Gram Stain   Final    NO WBC SEEN ABUNDANT  GRAM POSITIVE COCCI RARE GRAM NEGATIVE RODS RARE GRAM POSITIVE RODS Performed at [facility], [address] [place], [place] [address]    Culture   Final    ABUNDANT METHICILLIN RESISTANT STAPHYLOCOCCUS AUREUS   Report Status 02/03/2020 FINAL  Final   Organism ID, Bacteria METHICILLIN RESISTANT STAPHYLOCOCCUS AUREUS  Final      Susceptibility   Methicillin resistant staphylococcus aureus - MIC*    CIPROFLOXACIN >=8 RESISTANT Resistant     ERYTHROMYCIN >=8 RESISTANT Resistant     GENTAMICIN <=0.5 SENSITIVE Sensitive     OXACILLIN >=4 RESISTANT Resistant     TETRACYCLINE <=1 SENSITIVE Sensitive     VANCOMYCIN 1 SENSITIVE Sensitive     TRIMETH/SULFA <=10 SENSITIVE Sensitive     CLINDAMYCIN <=0.25 SENSITIVE Sensitive     RIFAMPIN <=0.5 SENSITIVE Sensitive     Inducible Clindamycin NEGATIVE Sensitive     * ABUNDANT METHICILLIN RESISTANT STAPHYLOCOCCUS AUREUS  Blood Culture (routine x 2)     Status: Abnormal   Collection Time: 02/01/20  4:48 AM   Specimen: BLOOD  Result Value Ref Range Status   Specimen Description BLOOD RIGHT ANTECUBITAL  Final   Special Requests   Final    BOTTLES DRAWN AEROBIC AND ANAEROBIC Blood Culture adequate volume   Culture  Setup Time   Final    GRAM POSITIVE COCCI IN CLUSTERS IN BOTH AEROBIC AND ANAEROBIC BOTTLES CRITICAL RESULT CALLED TO, READ BACK BY AND VERIFIED WITH: J. LEDFORD,PHARMD 0018 02/02/2020 Mena Goes Performed at [facility], [address] [place], [place] [address]    Culture METHICILLIN RESISTANT STAPHYLOCOCCUS AUREUS (A)  Final   Report Status 02/03/2020 FINAL  Final  Organism ID, Bacteria METHICILLIN RESISTANT STAPHYLOCOCCUS AUREUS  Final      Susceptibility   Methicillin resistant staphylococcus aureus - MIC*    CIPROFLOXACIN >=8 RESISTANT Resistant     ERYTHROMYCIN >=8 RESISTANT Resistant     GENTAMICIN <=0.5 SENSITIVE Sensitive     OXACILLIN >=4 RESISTANT Resistant     TETRACYCLINE <=1 SENSITIVE Sensitive      VANCOMYCIN 1 SENSITIVE Sensitive     TRIMETH/SULFA <=10 SENSITIVE Sensitive     CLINDAMYCIN <=0.25 SENSITIVE Sensitive     RIFAMPIN <=0.5 SENSITIVE Sensitive     Inducible Clindamycin NEGATIVE Sensitive     * METHICILLIN RESISTANT STAPHYLOCOCCUS AUREUS  Blood Culture ID Panel (Reflexed)     Status: Abnormal   Collection Time: 02/01/20  4:48 AM  Result Value Ref Range Status   Enterococcus faecalis NOT DETECTED NOT DETECTED Final   Enterococcus Faecium NOT DETECTED NOT DETECTED Final   Listeria monocytogenes NOT DETECTED NOT DETECTED Final   Staphylococcus species DETECTED (A) NOT DETECTED Final    Comment: CRITICAL RESULT CALLED TO, READ BACK BY AND VERIFIED WITH: J. LEDFORD,PHARMD 0018 02/02/2020 T. TYSOR    Staphylococcus aureus (BCID) DETECTED (A) NOT DETECTED Final    Comment: Methicillin (oxacillin)-resistant Staphylococcus aureus (MRSA). MRSA is predictably resistant to beta-lactam antibiotics (except ceftaroline). Preferred therapy is vancomycin unless clinically contraindicated. Patient requires contact precautions if  hospitalized. CRITICAL RESULT CALLED TO, READ BACK BY AND VERIFIED WITH: J. LEDFORD,PHARMD 0018 02/02/2020 T. TYSOR    Staphylococcus epidermidis NOT DETECTED NOT DETECTED Final   Staphylococcus lugdunensis NOT DETECTED NOT DETECTED Final   Streptococcus species NOT DETECTED NOT DETECTED Final   Streptococcus agalactiae NOT DETECTED NOT DETECTED Final   Streptococcus pneumoniae NOT DETECTED NOT DETECTED Final   Streptococcus pyogenes NOT DETECTED NOT DETECTED Final   A.calcoaceticus-baumannii NOT DETECTED NOT DETECTED Final   Bacteroides fragilis NOT DETECTED NOT DETECTED Final   Enterobacterales NOT DETECTED NOT DETECTED Final   Enterobacter cloacae complex NOT DETECTED NOT DETECTED Final   Escherichia coli NOT DETECTED NOT DETECTED Final   Klebsiella aerogenes NOT DETECTED NOT DETECTED Final   Klebsiella oxytoca NOT DETECTED NOT DETECTED Final   Klebsiella  pneumoniae NOT DETECTED NOT DETECTED Final   Proteus species NOT DETECTED NOT DETECTED Final   Salmonella species NOT DETECTED NOT DETECTED Final   Serratia marcescens NOT DETECTED NOT DETECTED Final   Haemophilus influenzae NOT DETECTED NOT DETECTED Final   Neisseria meningitidis NOT DETECTED NOT DETECTED Final   Pseudomonas aeruginosa NOT DETECTED NOT DETECTED Final   Stenotrophomonas maltophilia NOT DETECTED NOT DETECTED Final   Candida albicans NOT DETECTED NOT DETECTED Final   Candida auris NOT DETECTED NOT DETECTED Final   Candida glabrata NOT DETECTED NOT DETECTED Final   Candida krusei NOT DETECTED NOT DETECTED Final   Candida parapsilosis NOT DETECTED NOT DETECTED Final   Candida tropicalis NOT DETECTED NOT DETECTED Final   Cryptococcus neoformans/gattii NOT DETECTED NOT DETECTED Final   Meth resistant mecA/C and MREJ DETECTED (A) NOT DETECTED Final    Comment: CRITICAL RESULT CALLED TO, READ BACK BY AND VERIFIED WITH: J. LEDFORD,PHARMD 0018 02/02/2020 T. TYSOR Performed at [facility], [address] [place], [place] [address]   Aerobic/Anaerobic Culture (surgical/deep wound)     Status: None   Collection Time: 02/01/20 11:31 AM   Specimen: Buttocks; Abscess  Result Value Ref Range Status   Specimen Description ABSCESS  Final   Special Requests BUTTOCKS  Final   Gram Stain  Final    RARE WBC PRESENT, PREDOMINANTLY PMN FEW GRAM POSITIVE COCCI    Culture   Final    MODERATE METHICILLIN RESISTANT STAPHYLOCOCCUS AUREUS NO ANAEROBES ISOLATED Performed at [facility], [address], [place], [place] [address]    Report Status 02/06/2020 FINAL  Final   Organism ID, Bacteria METHICILLIN RESISTANT STAPHYLOCOCCUS AUREUS  Final      Susceptibility   Methicillin resistant staphylococcus aureus - MIC*    CIPROFLOXACIN >=8 RESISTANT Resistant     ERYTHROMYCIN >=8 RESISTANT Resistant     GENTAMICIN <=0.5 SENSITIVE Sensitive     OXACILLIN >=4 RESISTANT Resistant      TETRACYCLINE <=1 SENSITIVE Sensitive     VANCOMYCIN 1 SENSITIVE Sensitive     TRIMETH/SULFA <=10 SENSITIVE Sensitive     CLINDAMYCIN <=0.25 SENSITIVE Sensitive     RIFAMPIN <=0.5 SENSITIVE Sensitive     Inducible Clindamycin NEGATIVE Sensitive     * MODERATE METHICILLIN RESISTANT STAPHYLOCOCCUS AUREUS  Urine culture     Status: Abnormal (Preliminary result)   Collection Time: 02/02/20  5:05 AM   Specimen: In/Out Cath Urine  Result Value Ref Range Status   Specimen Description IN/OUT CATH URINE  Final   Special Requests NONE  Final   Culture (A)  Final    400 COLONIES/mL GRAM NEGATIVE RODS Sent to [place] for further susceptibility testing. AND IDENTIFICATION Performed at [facility], [place] [address] [place], [place] [address]    Report Status PENDING  Incomplete  MRSA PCR Screening     Status: None   Collection Time: 02/02/20  5:16 AM   Specimen: Nasal Mucosa; Nasopharyngeal  Result Value Ref Range Status   MRSA by PCR NEGATIVE NEGATIVE Final    Comment:        The GeneXpert MRSA Assay (FDA approved for NASAL specimens only), is one component of a comprehensive MRSA colonization surveillance program. It is not intended to diagnose MRSA infection nor to guide or monitor treatment for MRSA infections. Performed at [facility], [place] [address] [place], [place] [address]   Culture, blood (routine x 2)     Status: None   Collection Time: 02/03/20  4:02 AM   Specimen: BLOOD  Result Value Ref Range Status   Specimen Description BLOOD SITE NOT SPECIFIED  Final   Special Requests   Final    BOTTLES DRAWN AEROBIC ONLY Blood Culture results may not be optimal due to an inadequate volume of blood received in culture bottles   Culture   Final    NO GROWTH 5 DAYS Performed at [facility], [place] [address] [place], [place] [address]    Report Status 02/08/2020 FINAL  Final  Culture, blood (routine x 2)     Status: None   Collection Time: 02/03/20  4:13 AM    Specimen: BLOOD  Result Value Ref Range Status   Specimen Description BLOOD SITE NOT SPECIFIED  Final   Special Requests   Final    BOTTLES DRAWN AEROBIC ONLY Blood Culture results may not be optimal due to an inadequate volume of blood received in culture bottles   Culture   Final    NO GROWTH 5 DAYS Performed at [facility], [place] [address], [place],  [address]    Report Status 02/08/2020 FINAL  Final    Radiology Reports CT ABDOMEN PELVIS WO CONTRAST  Result Date: 02/01/2020 CLINICAL DATA:  Abdominal abscess/infection suspected Nausea. Patient reports decreased appetite. Patient  reports buttock abscess. EXAM: CT ABDOMEN AND PELVIS WITHOUT CONTRAST TECHNIQUE: Multidetector CT imaging of the abdomen and pelvis was performed following the standard protocol without IV contrast. COMPARISON:  CT 03/06/2017 FINDINGS: Lower chest: Minimal subpleural scarring in the left lower lobe. No acute airspace disease. No pleural effusion. Hepatobiliary: No focal hepatic abnormality on noncontrast exam. Multiple calcified gallstones without pericholecystic inflammation. There is no biliary dilatation. Pancreas: No ductal dilatation or inflammation. Spleen: Normal in size. No focal abnormality on noncontrast exam. Coils again seen in the splenic artery. Adrenals/Urinary Tract: Previous left adrenal hematoma has resolved, there is mild residual adrenal thickening. Normal right adrenal gland. Mild left hydronephrosis and hydroureter to the level of the distal pelvis. No stone or cause for obstruction. Mild prominence of the right ureter without hydronephrosis. There is symmetric bilateral perinephric edema. Urinary bladder is minimally distended. Stomach/Bowel: Small hiatal hernia. Stomach otherwise unremarkable. There is no bowel obstruction. No evidence of bowel wall thickening or inflammation. Few lower pelvic small bowel loops are fluid-filled but nondilated or inflamed. Normal appendix. High-riding  cecum. Moderate volume of colonic stool. There is colonic tortuosity. No colonic wall thickening or inflammation. Vascular/Lymphatic: Abdominal aorta is normal in caliber. Coiling of the splenic artery. Multiple small retroperitoneal lymph nodes, not enlarged by size criteria and likely reactive. Small bilateral inguinal nodes. Reproductive: Prostate unremarkable. Right testis may be within the inguinal canal, series 3, image 86. Other: No ascites or free air. Subcutaneous fluid collection involving the left gluteal soft tissues about the gluteal crease measuring 2.8 x 1.4 x 4.1 cm. Mild adjacent soft tissue edema tracks into the gluteal fold. Fluid collection in the inferior right gluteal soft tissues abutting the gluteal crease measures 3.0 x 2.4 x 3.8 cm. There is no air within either fluid collection or subcutaneous soft tissues. No extension into the anorectum. Musculoskeletal: Bilateral sacroiliac joint pinning and pubic symphyseal fixation. Remote left transverse process fractures in the lumbar spine. Remote left rib fractures. No acute osseous abnormalities. IMPRESSION: 1. Two gluteal fluid collections, 1 on the right and 1 on the left. Subcutaneous fluid collection involving the left gluteal soft tissues abut the gluteal crease measuring 2.8 x 1.4 x 4.1 cm, suspicious for abscess. Fluid collection in the inferior right gluteal soft tissues abutting the gluteal crease measures 3.0 x 2.4 x 3.8 cm. No air within either fluid collection or subcutaneous soft tissues. 2. Mild left hydronephrosis and hydroureter to the level of the distal pelvis. No stone or cause for obstruction. Bilateral perinephric edema, nonspecific. Decompressed urinary bladder which appears thick walled. 3. Cholelithiasis without gallbladder inflammation. 4. Small hiatal hernia. 5. Right testis may be within the inguinal canal. Recommend correlation with physical exam. Electronically Signed   By: Keith Rake M.D.   On: 02/01/2020 03:39    US RENAL  Result Date: 02/03/2020 CLINICAL DATA:  Acute kidney injury. EXAM: RENAL / URINARY TRACT ULTRASOUND COMPLETE COMPARISON:  CT 02/01/2020, renal ultrasound 01/03/2020 FINDINGS: Right Kidney: Renal measurements: 11.7 x 5.6 x 5.3 cm = volume: 180 mL. Mild increased renal parenchymal echogenicity. No mass or hydronephrosis visualized. Left Kidney: Renal measurements: 10.1 x 5.3 x 5.2 cm = volume: 146 mL. Mild hydronephrosis. Renal pelvis measures 12 mm in dimension. Mild increased renal echogenicity. No evidence of focal lesion. Bladder: Appears normal for degree of bladder distention. Other: Incidental gallstones. IMPRESSION: 1. Mild left hydronephrosis. This is unchanged from prior CT as well as renal ultrasound last month. Cause for obstruction of stab list. 2. Mild  increased renal echogenicity consistent with chronic medical renal disease. Electronically Signed   By: Keith Rake M.D.   On: 02/03/2020 23:07   IR Fluoro Guide CV Line Right  Result Date: 02/05/2020 INDICATION: MRSA bacteremia, cellulitis, access for long-term antibiotics EXAM: TUNNELED PICC LINE WITH ULTRASOUND AND FLUOROSCOPIC GUIDANCE MEDICATIONS: 1% lidocaine local. ANESTHESIA/SEDATION: Moderate Sedation Time:  None. The patient was continuously monitored during the procedure by the interventional radiology nurse under my direct supervision. FLUOROSCOPY TIME:  Fluoroscopy Time: 0 minutes 6 seconds (1 mGy). COMPLICATIONS: None immediate. PROCEDURE: Informed written consent was obtained from the patient after a discussion of the risks, benefits, and alternatives to treatment. Questions regarding the procedure were encouraged and answered. The right neck and chest were prepped with chlorhexidine in a sterile fashion, and a sterile drape was applied covering the operative field. Maximum barrier sterile technique with sterile gowns and gloves were used for the procedure. A timeout was performed prior to the initiation of the  procedure. After creating a small venotomy incision, a micropuncture kit was utilized to access the right internal jugular vein under direct, real-time ultrasound guidance after the overlying soft tissues were anesthetized with 1% lidocaine with epinephrine. Ultrasound image documentation was performed. The microwire was kinked to measure appropriate catheter length. The micropuncture sheath was exchanged for a peel-away sheath over a guidewire. A 5 French dual lumen tunneled PICC measuring 25 cm was tunneled in a retrograde fashion from the anterior chest wall to the venotomy incision. The catheter was then placed through the peel-away sheath with tip ultimately positioned at the superior caval-atrial junction. Final catheter positioning was confirmed and documented with a spot radiographic image. The catheter aspirates and flushes normally. The catheter was flushed with appropriate volume heparin dwells. The catheter exit site was secured with a 3 0 Ethilon retention suture. The venotomy incision was closed with Dermabond. Dressings were applied. The patient tolerated the procedure well without immediate post procedural complication. FINDINGS: After catheter placement, the tip lies within the superior cavoatrial junction. The catheter aspirates and flushes normally and is ready for immediate use. IMPRESSION: Successful placement of 25cm dual lumen tunneled PICC catheter via the right internal jugular vein with tip terminating at the superior caval atrial junction. The catheter is ready for immediate use. Electronically Signed   By: Jerilynn Mages.  Shick M.D.   On: 02/05/2020 16:09   IR US Guide Vasc Access Right  Result Date: 02/05/2020 INDICATION: MRSA bacteremia, cellulitis, access for long-term antibiotics EXAM: TUNNELED PICC LINE WITH ULTRASOUND AND FLUOROSCOPIC GUIDANCE MEDICATIONS: 1% lidocaine local. ANESTHESIA/SEDATION: Moderate Sedation Time:  None. The patient was continuously monitored during the procedure by  the interventional radiology nurse under my direct supervision. FLUOROSCOPY TIME:  Fluoroscopy Time: 0 minutes 6 seconds (1 mGy). COMPLICATIONS: None immediate. PROCEDURE: Informed written consent was obtained from the patient after a discussion of the risks, benefits, and alternatives to treatment. Questions regarding the procedure were encouraged and answered. The right neck and chest were prepped with chlorhexidine in a sterile fashion, and a sterile drape was applied covering the operative field. Maximum barrier sterile technique with sterile gowns and gloves were used for the procedure. A timeout was performed prior to the initiation of the procedure. After creating a small venotomy incision, a micropuncture kit was utilized to access the right internal jugular vein under direct, real-time ultrasound guidance after the overlying soft tissues were anesthetized with 1% lidocaine with epinephrine. Ultrasound image documentation was performed. The microwire was kinked to measure appropriate catheter  length. The micropuncture sheath was exchanged for a peel-away sheath over a guidewire. A 5 French dual lumen tunneled PICC measuring 25 cm was tunneled in a retrograde fashion from the anterior chest wall to the venotomy incision. The catheter was then placed through the peel-away sheath with tip ultimately positioned at the superior caval-atrial junction. Final catheter positioning was confirmed and documented with a spot radiographic image. The catheter aspirates and flushes normally. The catheter was flushed with appropriate volume heparin dwells. The catheter exit site was secured with a 3 0 Ethilon retention suture. The venotomy incision was closed with Dermabond. Dressings were applied. The patient tolerated the procedure well without immediate post procedural complication. FINDINGS: After catheter placement, the tip lies within the superior cavoatrial junction. The catheter aspirates and flushes normally and is  ready for immediate use. IMPRESSION: Successful placement of 25cm dual lumen tunneled PICC catheter via the right internal jugular vein with tip terminating at the superior caval atrial junction. The catheter is ready for immediate use. Electronically Signed   By: Jerilynn Mages.  Shick M.D.   On: 02/05/2020 16:09   DG Chest Portable 1 View  Result Date: 02/01/2020 CLINICAL DATA:  Cough. EXAM: PORTABLE CHEST 1 VIEW COMPARISON:  Radiograph 04/01/2017 FINDINGS: Heart is normal in size. Normal mediastinal contours. Remote left rib fractures with minor scarring at the left lung base. No acute or confluent airspace disease. No pleural fluid or pneumothorax. No pulmonary edema. Left upper quadrant vascular coils. IMPRESSION: 1. No acute abnormality. 2. Remote left rib fractures with minor scarring at the left lung base. Electronically Signed   By: Keith Rake M.D.   On: 02/01/2020 03:41   ECHOCARDIOGRAM COMPLETE  Result Date: 02/02/2020    ECHOCARDIOGRAM REPORT   Patient Name:   Peter [name] Date of Exam: 02/02/2020 Medical Rec #:  [id number]        Height:       70.0 in Accession #:    [id number]       Weight:       159.4 lb Date of Birth:  1961-05-10       BSA:          1.895 m Patient Age:    6 years         BP:           142/84 mmHg Patient Gender: M                HR:           84 bpm. Exam Location:  Inpatient Procedure: 2D Echo Indications:    bacteremia  History:        Patient has no prior history of Echocardiogram examinations.                 Risk Factors:Diabetes and Hypertension.  Sonographer:    Jannett Celestine RDCS (AE) Referring Phys: [place]  1. There is a linear independently mobile echodensity in the LVOT (see image 30). This cannot be clearly traced back to a structure, but aortic valve endocarditis not excluded. Left ventricular ejection fraction, by estimation, is 65 to 70%. The left ventricle has normal function. The left ventricle has no regional wall motion abnormalities.  Left ventricular diastolic parameters were normal.  2. Right ventricular systolic function is normal. The right ventricular size is normal. Tricuspid regurgitation signal is inadequate for assessing PA pressure.  3. The mitral valve is normal in structure. Trivial mitral valve regurgitation. No evidence of  mitral stenosis.  4. Bicuspid aortic valve, with fusion of the RCC and LCC (image 20) with focal calcification. Mild AI. Marland Kitchen The aortic valve is bicuspid. Aortic valve regurgitation is mild. Mild aortic valve sclerosis is present, with no evidence of aortic valve stenosis.  5. The inferior vena cava is normal in size with greater than 50% respiratory variability, suggesting right atrial pressure of 3 mmHg. Comparison(s): No prior Echocardiogram. Conclusion(s)/Recommendation(s): Bicuspid aortic valve. There is an independently mobile linear echodensity seen in the LVOT. Given bacteremia, would recommend TEE to further evaluate for endocarditis. FINDINGS  Left Ventricle: There is a linear independently mobile echodensity in the LVOT (see image 30). This cannot be clearly traced back to a structure, but aortic valve endocarditis not excluded. Left ventricular ejection fraction, by estimation, is 65 to 70%. The left ventricle has normal function. The left ventricle has no regional wall motion abnormalities. The left ventricular internal cavity size was normal in size. There is borderline left ventricular hypertrophy. Left ventricular diastolic parameters were normal. Right Ventricle: The right ventricular size is normal. No increase in right ventricular wall thickness. Right ventricular systolic function is normal. Tricuspid regurgitation signal is inadequate for assessing PA pressure. Left Atrium: Left atrial size was normal in size. Right Atrium: Right atrial size was normal in size. Pericardium: There is no evidence of pericardial effusion. Mitral Valve: The mitral valve is normal in structure. Trivial mitral valve  regurgitation. No evidence of mitral valve stenosis. Tricuspid Valve: The tricuspid valve is normal in structure. Tricuspid valve regurgitation is trivial. No evidence of tricuspid stenosis. Aortic Valve: Bicuspid aortic valve, with fusion of the RCC and LCC (image 20) with focal calcification. Mild AI. The aortic valve is bicuspid. . There is mild thickening and mild calcification of the aortic valve. Aortic valve regurgitation is mild. Mild aortic valve sclerosis is present, with no evidence of aortic valve stenosis. There is mild thickening of the aortic valve. There is mild calcification of the aortic valve. Pulmonic Valve: The pulmonic valve was grossly normal. Pulmonic valve regurgitation is trivial. Aorta: The aortic root was not well visualized, the ascending aorta was not well visualized and the aortic arch was not well visualized. Venous: The inferior vena cava is normal in size with greater than 50% respiratory variability, suggesting right atrial pressure of 3 mmHg. IAS/Shunts: The atrial septum is grossly normal.  LEFT VENTRICLE PLAX 2D LVIDd:         4.70 cm  Diastology LVIDs:         2.80 cm  LV e' lateral:   7.62 cm/s LV PW:         1.10 cm  LV E/e' lateral: 9.7 LV IVS:        1.20 cm  LV e' medial:    5.98 cm/s LVOT diam:     2.20 cm  LV E/e' medial:  12.4 LV SV:         87 LV SV Index:   46 LVOT Area:     3.80 cm  RIGHT VENTRICLE RV S prime:     9.46 cm/s TAPSE (M-mode): 1.7 cm LEFT ATRIUM           Index       RIGHT ATRIUM          Index LA diam:      3.00 cm 1.58 cm/m  RA Area:     8.89 cm LA Vol (A2C): 20.6 ml 10.87 ml/m RA Volume:   15.00 ml 7.91  ml/m LA Vol (A4C): 21.9 ml 11.55 ml/m  AORTIC VALVE LVOT Vmax:   95.30 cm/s LVOT Vmean:  71.000 cm/s LVOT VTI:    0.229 m  AORTA Ao Root diam: 3.60 cm MITRAL VALVE MV Area (PHT): 3.27 cm     SHUNTS MV Decel Time: 232 msec     Systemic VTI:  0.23 m MV E velocity: 74.10 cm/s   Systemic Diam: 2.20 cm MV A velocity: 102.00 cm/s MV E/A ratio:  0.73  Buford Dresser MD Electronically signed by Buford Dresser MD Signature Date/Time: 02/02/2020/4:39:02 PM    Final    ECHO TEE  Result Date: 02/04/2020    TRANSESOPHOGEAL ECHO REPORT   Patient Name:   Peter [name] Date of Exam: 02/04/2020 Medical Rec #:  [id number]        Height:       70.0 in Accession #:    [id number]       Weight:       159.4 lb Date of Birth:  02-07-1961       BSA:          1.895 m Patient Age:    39 years         BP:           156/81 mmHg Patient Gender: M                HR:           64 bpm. Exam Location:  Inpatient Procedure: Transesophageal Echo and Color Doppler Indications:     Bacteremia  History:         Patient has prior history of Echocardiogram examinations, most                  recent 02/02/2020. Risk Factors:Diabetes, Hypertension and                  Dyslipidemia. CKD.  Sonographer:     Clayton Lefort RDCS (AE) Referring Phys:  [id number] [place] Diagnosing Phys: Buford Dresser MD PROCEDURE: After discussion of the risks and benefits of a TEE, an informed consent was obtained from the patient. The transesophogeal probe was passed without difficulty through the esophogus of the patient. Local oropharyngeal anesthetic was provided with viscous lidocaine. Sedation performed by different physician. The patient was monitored while under deep sedation. Anesthestetic sedation was provided intravenously by Anesthesiology: 139.40m of Propofol. Image quality was good. The patient's vital signs; including heart rate, blood pressure, and oxygen saturation; remained stable throughout the procedure. The patient developed no complications during the procedure. IMPRESSIONS  1. Left ventricular ejection fraction, by estimation, is 65 to 70%. The left ventricle has normal function. The left ventricle has no regional wall motion abnormalities.  2. Right ventricular systolic function is normal. The right ventricular size is normal.  3. No left atrial/left atrial appendage  thrombus was detected.  4. The mitral valve is normal in structure. Trivial mitral valve regurgitation. No evidence of mitral stenosis.  5. The aortic valve is bicuspid. Aortic valve regurgitation is trivial. No aortic stenosis is present.  6. There is mild (Grade II) plaque involving the descending aorta. Conclusion(s)/Recommendation(s): No evidence of endocarditis. Bicuspid aortic valve. Structure seen on TTE is associated with the mitral valve, not the aortic, near the mitral-aortic annular junction. This appears to be a chordae, not consistent with vegetation. FINDINGS  Left Ventricle: Left ventricular ejection fraction, by estimation, is 65 to 70%. The left ventricle has normal function. The  left ventricle has no regional wall motion abnormalities. The left ventricular internal cavity size was normal in size. There is  borderline left ventricular hypertrophy. Right Ventricle: The right ventricular size is normal. No increase in right ventricular wall thickness. Right ventricular systolic function is normal. Left Atrium: Left atrial size was not assessed. No left atrial/left atrial appendage thrombus was detected. Right Atrium: Right atrial size was not assessed. Pericardium: There is no evidence of pericardial effusion. Mitral Valve: The mitral valve is normal in structure. Trivial mitral valve regurgitation. No evidence of mitral valve stenosis. Tricuspid Valve: The tricuspid valve is normal in structure. Tricuspid valve regurgitation is trivial. No evidence of tricuspid stenosis. Aortic Valve: The aortic valve is bicuspid. Aortic valve regurgitation is trivial. No aortic stenosis is present. Pulmonic Valve: The pulmonic valve was grossly normal. Pulmonic valve regurgitation is trivial. Aorta: The aortic root is normal in size and structure. There is mild (Grade II) plaque involving the descending aorta. IAS/Shunts: No atrial level shunt detected by color flow Doppler. Buford Dresser MD Electronically  signed by Buford Dresser MD Signature Date/Time: 02/04/2020/5:29:42 PM    Final      Time Spent in minutes  30     Desiree Hane M.D on 02/10/2020 at 11:05 AM  To page go to [email or web address] - password [facility]

## 2020-02-10 NOTE — Plan of Care (Signed)
?  Problem: Clinical Measurements: ?Goal: Ability to maintain clinical measurements within normal limits will improve ?Outcome: Progressing ?Goal: Will remain free from infection ?Outcome: Progressing ?Goal: Diagnostic test results will improve ?Outcome: Progressing ?  ?

## 2020-02-10 NOTE — Progress Notes (Signed)
[facility] Progress Note  6 Days Post-Op  Subjective: CC-  Doing well with dressing changes and hydrotherapy. Ambulated to restroom independently last night. WBC 6.8, afebrile  Objective: Vital signs in last 24 hours: Temp:  [97.6 F (36.4 C)-98.7 F (37.1 C)] 98 F (36.7 C) (08/23 1023) Pulse Rate:  [65-85] 74 (08/23 1023) Resp:  [18] 18 (08/23 1023) BP: (125-140)/(70-73) 136/73 (08/23 1023) SpO2:  [97 %-100 %] 99 % (08/23 1023) Last BM Date: 02/05/20  Intake/Output from previous day: 08/22 0701 - 08/23 0700 In: 1140 [P.O.:1140] Out: 1125 [Urine:1125] Intake/Output this shift: Total I/O In: 220 [P.O.:220] Out: 300 [Urine:300]  PE: Gen:  Alert, NAD, pleasant Pulm:  rate and effort normal Skin:  -Sacral: small amount of fibrinous exudate around edges of wounds, no purulent drainage or cellulitis    -Right gluteal: small amount of fibrinous exudate around the edges, no purulent drainage or cellulitis    -Left gluteal: mild induration around smaller of the two wounds seen below - this is less than last week. No connection between the two wounds. No purulent drainage or cellulitis      Lab Results:  Recent Labs    02/09/20 0852 02/10/20 0801  WBC 9.4 6.8  HGB 7.8* 7.5*  HCT 25.5* 24.0*  PLT 322 PLATELET CLUMPS NOTED ON SMEAR, UNABLE TO ESTIMATE   BMET Recent Labs    02/09/20 0852 02/09/20 0852 02/09/20 1502 02/10/20 0801  NA 136  --   --  135  K 6.3*   < > 4.9 5.5*  CL 104  --   --  102  CO2 23  --   --  22  GLUCOSE 157*  --   --  176*  BUN 69*  --   --  72*  CREATININE 4.30*  --   --  4.38*  CALCIUM 8.4*  --   --  8.2*   < > = values in this interval not displayed.   PT/INR No results for input(s): LABPROT, INR in the last 72 hours. CMP     Component Value Date/Time   NA 135 02/10/2020 0801   K 5.5 (H) 02/10/2020 0801   CL 102 02/10/2020 0801   CO2 22 02/10/2020 0801   GLUCOSE 176 (H) 02/10/2020 0801   BUN 72 (H) 02/10/2020  0801   CREATININE 4.38 (H) 02/10/2020 0801   CALCIUM 8.2 (L) 02/10/2020 0801   PROT 5.5 (L) 02/02/2020 0225   ALBUMIN 2.0 (L) 02/03/2020 0402   AST 26 02/02/2020 0225   ALT 29 02/02/2020 0225   ALKPHOS 84 02/02/2020 0225   BILITOT 0.4 02/02/2020 0225   GFRNONAA 14 (L) 02/10/2020 0801   GFRAA 16 (L) 02/10/2020 0801   Lipase  No results found for: LIPASE     Studies/Results: No results found.  Anti-infectives: Anti-infectives (From admission, onward)   Start     Dose/Rate Route Frequency Ordered Stop   02/04/20 2000  DAPTOmycin (CUBICIN) 600 mg in sodium chloride 0.9 % IVPB        600 mg 224 mL/hr over 30 Minutes Intravenous Every 48 hours 02/03/20 1426     02/02/20 1430  DAPTOmycin (CUBICIN) 500 mg in sodium chloride 0.9 % IVPB  Status:  Discontinued        500 mg 220 mL/hr over 30 Minutes Intravenous Every 48 hours 02/02/20 1332 02/03/20 1426   02/01/20 0800  meropenem (MERREM) 500 mg in sodium chloride 0.9 % 100 mL IVPB  Status:  Discontinued  500 mg 200 mL/hr over 30 Minutes Intravenous Every 12 hours 02/01/20 0656 02/02/20 1304   02/01/20 0655  vancomycin variable dose per unstable renal function (pharmacist dosing)  Status:  Discontinued         Does not apply See admin instructions 02/01/20 0656 02/02/20 1305   02/01/20 0300  ceFEPIme (MAXIPIME) 2 g in sodium chloride 0.9 % 100 mL IVPB        2 g 200 mL/hr over 30 Minutes Intravenous  Once 02/01/20 0252 02/01/20 0446   02/01/20 0300  vancomycin (VANCOREADY) IVPB 1500 mg/300 mL        1,500 mg 150 mL/hr over 120 Minutes Intravenous  Once 02/01/20 0252 02/01/20 0624       Assessment/Plan DM CKD/AKI- Creatinine 4.38 (8/23) Anemia-Hgb 7.5 (8/23) HTN MRSA bacteremia  POD9, s/pIRRIGATION AND DEBRIDEMENT ABSCESSof multiple buttocks abscesses - 8/14 - [facility] - cxs revealMRSA. abx per ID - WBC WNL, afebrile - Wounds cleaning up well. Continue hydrotherapy, suspect we can stop this in a day or so.  Continue wet to dry dressing changes with santyl. Likely ready for discharge later this week.  FEN - renal/carb mod VTE -sq heparin ID -cubicin Follow up - wound clinic?   LOS: 9 days    [facility] 02/10/2020, 11:47 AM Please see Amion for pager number during day hours 7:00am-4:30pm

## 2020-02-11 LAB — POTASSIUM
Potassium: 4.5 mmol/L (ref 3.5–5.1)
Potassium: 4.6 mmol/L (ref 3.5–5.1)

## 2020-02-11 LAB — BASIC METABOLIC PANEL
Anion gap: 11 (ref 5–15)
BUN: 79 mg/dL — ABNORMAL HIGH (ref 6–20)
CO2: 23 mmol/L (ref 22–32)
Calcium: 8.5 mg/dL — ABNORMAL LOW (ref 8.9–10.3)
Chloride: 101 mmol/L (ref 98–111)
Creatinine, Ser: 4.57 mg/dL — ABNORMAL HIGH (ref 0.61–1.24)
GFR calc Af Amer: 15 mL/min — ABNORMAL LOW (ref >60)
GFR calc non Af Amer: 13 mL/min — ABNORMAL LOW (ref >60)
Glucose, Bld: 147 mg/dL — ABNORMAL HIGH (ref 70–99)
Potassium: 5.7 mmol/L — ABNORMAL HIGH (ref 3.5–5.1)
Sodium: 135 mmol/L (ref 135–145)

## 2020-02-11 LAB — CBC
HCT: 23.9 % — ABNORMAL LOW (ref 39.0–52.0)
Hemoglobin: 7.5 g/dL — ABNORMAL LOW (ref 13.0–17.0)
MCH: 29.6 pg (ref 26.0–34.0)
MCHC: 31.4 g/dL (ref 30.0–36.0)
MCV: 94.5 fL (ref 80.0–100.0)
Platelets: 254 10*3/uL (ref 150–400)
RBC: 2.53 MIL/uL — ABNORMAL LOW (ref 4.22–5.81)
RDW: 12.7 % (ref 11.5–15.5)
WBC: 6.9 10*3/uL (ref 4.0–10.5)
nRBC: 0 % (ref 0.0–0.2)

## 2020-02-11 LAB — GLUCOSE, CAPILLARY
Glucose-Capillary: 149 mg/dL — ABNORMAL HIGH (ref 70–99)
Glucose-Capillary: 125 mg/dL — ABNORMAL HIGH (ref 70–99)
Glucose-Capillary: 177 mg/dL — ABNORMAL HIGH (ref 70–99)
Glucose-Capillary: 223 mg/dL — ABNORMAL HIGH (ref 70–99)

## 2020-02-11 LAB — MISC LABCORP TEST (SEND OUT): Labcorp test code: 182261

## 2020-02-11 MED ORDER — SODIUM ZIRCONIUM CYCLOSILICATE 10 G PO PACK
10.0000 g | PACK | Freq: Three times a day (TID) | ORAL | Status: DC
  Administered 2020-02-11: 10 g via ORAL
  Filled 2020-02-11: qty 1

## 2020-02-11 MED ORDER — SODIUM ZIRCONIUM CYCLOSILICATE 10 G PO PACK
10.0000 g | PACK | Freq: Every day | ORAL | Status: DC
  Administered 2020-02-12 – 2020-02-14 (×3): 10 g via ORAL
  Filled 2020-02-11 (×3): qty 1

## 2020-02-11 NOTE — Progress Notes (Addendum)
Physical Therapy Wound Treatment and Discharge Patient Details  Name: Holman Bonsignore MRN: [id number] Date of Birth: Feb 01, 1961  Today's Date: 02/11/2020 Time: 1012-1057 Time Calculation (min): 45 min  Subjective  Subjective: Pt reports he may be going home soom  Patient and Family Stated Goals: heal wounds Date of Onset: 02/03/20 Prior Treatments: topical treatment  Pain Score:  Pt with no complaint of pain during session   Wound Assessment  Wound / Incision (Open or Dehisced) 02/04/20 Incision - Open Buttocks Right Right superior buttock wound  (Active)  Wound Image   02/04/20 1300  Dressing Type Gauze (Comment);Normal saline moist dressing;Mesh briefs 02/11/20 1400  Dressing Changed Changed 02/11/20 1400  Dressing Status Clean;Dry;Intact 02/11/20 1400  Dressing Change Frequency Twice a day 02/11/20 1400  Site / Wound Assessment Red;Yellow;Granulation tissue 02/11/20 1400  % Wound base Red or Granulating 95% 02/11/20 1400  % Wound base Yellow/Fibrinous Exudate 5% 02/11/20 1400  % Wound base Black/Eschar 10% 02/06/20 1257  Peri-wound Assessment Intact 02/11/20 1400  Wound Length (cm) 2.7 cm 02/11/20 1400  Wound Width (cm) 2.2 cm 02/11/20 1400  Wound Depth (cm) 1.2 cm 02/11/20 1400  Wound Volume (cm^3) 7.13 cm^3 02/11/20 1400  Wound Surface Area (cm^2) 5.94 cm^2 02/11/20 1400  Margins Unattached edges (unapproximated) 02/11/20 1400  Closure None 02/11/20 1400  Drainage Amount Minimal 02/11/20 1400  Drainage Description Serous 02/11/20 1400  Non-staged Wound Description Full thickness 02/11/20 1400  Treatment Debridement (Selective);Hydrotherapy (Pulse lavage);Packing (Saline gauze) 02/11/20 1400     Wound / Incision (Open or Dehisced) 02/04/20 Incision - Open Buttocks Right R central buttock incision  (Active)  Wound Image   02/04/20 1300  Dressing Type Gauze (Comment);Normal saline moist dressing;Mesh briefs 02/11/20 1400  Dressing Changed Changed 02/11/20 1400  Dressing  Status Clean;Dry;Intact 02/11/20 1400  Dressing Change Frequency Twice a day 02/11/20 1400  Site / Wound Assessment Red;Yellow;Granulation tissue 02/11/20 1400  % Wound base Red or Granulating 95% 02/11/20 1400  % Wound base Yellow/Fibrinous Exudate 5% 02/11/20 1400  % Wound base Black/Eschar 0% 02/11/20 1400  % Wound base Other/Granulation Tissue (Comment) 0% 02/11/20 1400  Peri-wound Assessment Intact 02/11/20 1400  Wound Length (cm) 2.2 cm 02/11/20 1400  Wound Width (cm) 1.1 cm 02/11/20 1400  Wound Depth (cm) 1.3 cm 02/11/20 1400  Wound Volume (cm^3) 3.15 cm^3 02/11/20 1400  Wound Surface Area (cm^2) 2.42 cm^2 02/11/20 1400  Margins Unattached edges (unapproximated) 02/11/20 1400  Closure None 02/11/20 1400  Drainage Amount Minimal 02/11/20 1400  Drainage Description Serous 02/11/20 1400  Non-staged Wound Description Full thickness 02/11/20 1400  Treatment Debridement (Selective);Hydrotherapy (Pulse lavage);Packing (Saline gauze) 02/11/20 1400     Wound / Incision (Open or Dehisced) 02/04/20 Incision - Open Buttocks Right R inferior buttock main  (Active)  Wound Image   02/04/20 1300  Dressing Type Gauze (Comment);Mesh briefs;Normal saline moist dressing 02/11/20 1400  Dressing Changed Changed 02/11/20 1400  Dressing Status Clean;Dry;Intact 02/11/20 1400  Dressing Change Frequency Twice a day 02/11/20 1400  Site / Wound Assessment Yellow;Red;Granulation tissue;Painful;Brown;Friable 02/11/20 1400  % Wound base Red or Granulating 80% 02/11/20 1400  % Wound base Yellow/Fibrinous Exudate 20% 02/11/20 1400  % Wound base Black/Eschar 0% 02/10/20 1500  % Wound base Other/Granulation Tissue (Comment) 0% 02/10/20 1500  Peri-wound Assessment Erythema (blanchable);Induration;Maceration 02/11/20 1400  Wound Length (cm) 4.4 cm 02/11/20 1400  Wound Width (cm) 1.9 cm 02/11/20 1400  Wound Depth (cm) 1.3 cm 02/11/20 1400  Wound Volume (cm^3) 10.87 cm^3 02/11/20 1400  Wound Surface Area (cm^2)  8.36 cm^2 02/11/20 1400  Undermining (cm) 0.8cm at 6;00 and 1.4 at 5 o'clock 02/11/20 1400  Margins Unattached edges (unapproximated) 02/11/20 1400  Closure None 02/11/20 1400  Drainage Amount Minimal 02/11/20 1400  Drainage Description Serous 02/11/20 1400  Non-staged Wound Description Full thickness 02/11/20 1400  Treatment Debridement (Selective);Hydrotherapy (Pulse lavage);Packing (Saline gauze) 02/11/20 1400     Wound / Incision (Open or Dehisced) 02/04/20 Incision - Open Buttocks Right R Inferior buttock satellite (will likely merge with R inferior buttock main) (Active)  Dressing Type Gauze (Comment);Mesh briefs;Normal saline moist dressing 02/11/20 1400  Dressing Changed Changed 02/11/20 1400  Dressing Status Clean;Dry;Intact 02/11/20 1400  Dressing Change Frequency Twice a day 02/11/20 1400  Site / Wound Assessment Yellow;Pink 02/11/20 1400  % Wound base Red or Granulating 40% 02/11/20 1400  % Wound base Yellow/Fibrinous Exudate 60% 02/11/20 1400  % Wound base Black/Eschar 0% 02/10/20 1500  % Wound base Other/Granulation Tissue (Comment) 0% 02/10/20 1500  Peri-wound Assessment Erythema (blanchable);Induration 02/11/20 1400  Wound Length (cm) 1.1 cm 02/11/20 1400  Wound Width (cm) 1.3 cm 02/11/20 1400  Wound Depth (cm) 0 cm 02/04/20 1300  Wound Volume (cm^3) 0 cm^3 02/04/20 1300  Wound Surface Area (cm^2) 1.43 cm^2 02/11/20 1400  Margins Unattached edges (unapproximated) 02/11/20 1400  Closure None 02/11/20 1400  Drainage Amount Minimal 02/11/20 1400  Drainage Description Serous 02/11/20 1400  Non-staged Wound Description Full thickness 02/11/20 1400  Treatment Hydrotherapy (Pulse lavage);Packing (Saline gauze) 02/11/20 1400     Wound / Incision (Open or Dehisced) 02/04/20 Incision - Open Buttocks Left L central buttock  (Active)  Wound Image   02/04/20 1400  Dressing Type Gauze (Comment);Barrier Film (skin prep);Mesh briefs;Normal saline moist dressing 02/11/20 1400   Dressing Changed Changed 02/11/20 1400  Dressing Status Clean;Dry;Intact 02/11/20 1400  Dressing Change Frequency Twice a day 02/11/20 1400  Site / Wound Assessment Pink;Yellow;Red 02/11/20 1400  % Wound base Red or Granulating 90% 02/11/20 1400  % Wound base Yellow/Fibrinous Exudate 10% 02/11/20 1400  % Wound base Black/Eschar 0% 02/10/20 1500  Peri-wound Assessment Erythema (blanchable) 02/11/20 1400  Wound Length (cm) 4.4 cm 02/11/20 1400  Wound Width (cm) 1.4 cm 02/11/20 1400  Wound Depth (cm) 1.4 cm 02/11/20 1400  Wound Volume (cm^3) 8.62 cm^3 02/11/20 1400  Wound Surface Area (cm^2) 6.16 cm^2 02/11/20 1400  Margins Unattached edges (unapproximated) 02/11/20 1400  Closure None 02/11/20 1400  Drainage Amount Minimal 02/11/20 1400  Drainage Description Serous 02/11/20 1400  Non-staged Wound Description Full thickness 02/11/20 1400  Treatment Debridement (Selective);Hydrotherapy (Pulse lavage);Packing (Saline gauze) 02/11/20 1400     Wound / Incision (Open or Dehisced) 02/04/20 Incision - Open Buttocks Left L inferior buttock  (Active)  Wound Image   02/04/20 1300  Dressing Type Barrier Film (skin prep);Gauze (Comment);Mesh briefs;Normal saline moist dressing 02/11/20 1400  Dressing Changed Changed 02/11/20 1400  Dressing Status Clean;Dry;Intact 02/11/20 1400  Dressing Change Frequency Twice a day 02/11/20 1400  Site / Wound Assessment Yellow;Pink;Friable 02/11/20 1400  % Wound base Red or Granulating 90% 02/11/20 1400  % Wound base Yellow/Fibrinous Exudate 5% 02/11/20 1400  % Wound base Black/Eschar 15% 02/11/20 1400  % Wound base Other/Granulation Tissue (Comment) 0% 02/11/20 1400  Peri-wound Assessment Erythema (blanchable);Induration 02/10/20 1500  Wound Length (cm) 5.5 cm 02/11/20 1400  Wound Width (cm) 1.3 cm 02/11/20 1400  Wound Depth (cm) 1.3 cm 02/11/20 1400  Wound Volume (cm^3) 9.3 cm^3 02/11/20 1400  Wound Surface Area (cm^2)  7.15 cm^2 02/11/20 1400  Undermining  (cm) 1.2 cm 6 o'clock, 0.2 cm at 5 o'clock  02/11/20 1400  Margins Unattached edges (unapproximated) 02/11/20 1400  Closure None 02/11/20 1400  Drainage Amount Minimal 02/11/20 1400  Drainage Description Serous 02/11/20 1400  Non-staged Wound Description Full thickness 02/11/20 1400  Treatment Debridement (Selective);Hydrotherapy (Pulse lavage);Packing (Saline gauze) 02/11/20 1400   Santyl applied to necrotic tissue in all buttock wounds    Hydrotherapy Pulsed lavage therapy - wound location: all buttock wounds Pulsed Lavage with Suction (psi):  (8-12) Pulsed Lavage with Suction - Normal Saline Used: 2000 mL Pulsed Lavage Tip: Tip with splash shield Selective Debridement Selective Debridement - Location: L and R buttock wounds  Selective Debridement - Tools Used: Forceps;Scalpel Selective Debridement - Tissue Removed: necrotic tissue (yellow, brown)   Wound Assessment and Plan  Wound Therapy - Assess/Plan/Recommendations Wound Therapy - Clinical Statement: Surgical PA pleased with response to hydrotherapy and is discontinuing hydrotherapy service at this time.  Wound Therapy - Functional Problem List: uncontrolled DM Factors Delaying/Impairing Wound Healing: Diabetes Mellitus;Altered sensation;Infection - systemic/local;Multiple medical problems Hydrotherapy Plan: Debridement;Dressing change;Pulsatile lavage with suction;Patient/family education Wound Therapy - Frequency: 6X / week Wound Therapy - Follow Up Recommendations: [place] Wound Plan: see above  Wound Therapy Goals- Improve the function of patient's integumentary system by progressing the wound(s) through the phases of wound healing (inflammation - proliferation - remodeling) by: Decrease Necrotic Tissue to: 0 Decrease Necrotic Tissue - Progress: Progressing toward goal Increase Granulation Tissue to: 100 Increase Granulation Tissue - Progress: Progressing toward goal Goals/treatment plan/discharge plan were  made with and agreed upon by patient/family: Yes Time For Goal Achievement: 7 days Wound Therapy - Potential for Goals: Fair  Goals will be updated until maximal potential achieved or discharge criteria met.  Discharge criteria: when goals achieved, discharge from hospital, MD decision/surgical intervention, no progress towards goals, refusal/missing three consecutive treatments without notification or medical reason.  GP    Dani Gobble. Migdalia Dk PT, DPT Acute Rehabilitation Services Pager [phone] Office [phone]  [place] 02/11/2020, 2:34 PM

## 2020-02-11 NOTE — Progress Notes (Signed)
[facility]  PROGRESS NOTE  Peter [name] XBW:[id number] DOB: Jun 04, 1961 DOA: 01/31/2020 PCP: Raelene Bott, MD Admit date - 01/31/2020   Admitting Physician Vernelle Emerald, MD  Outpatient Primary MD for the patient is Raelene Bott, MD  LOS - 10 Brief Narrative  Peter [name] is a 59 y.o. year old male with medical history significant for uncontrolled type 2 diabetes, diabetic nephropathy, HTN, HLD, CKD stage IV who presented on 8/13/21subjective chills, generalized weakness, nausea and vomiting in multiple draining ulcers of the buttocks for the past week and was found to have leukocytosis, AKI with creatinine of 6, and CT imaging consistent with gluteal fluid collections concerning for abscesses that required debridement by Dr. Lucia Gaskins with surgery on 8/14.  Patient was found to have MRSA bacteremia as well as staph aureus growing from cultures from the abscess.  Empiric vancomycin and meropenem were changed to daptomycin due to acute on chronic kidney disease and culture data with ID consultants assistance.  Patient underwent TTE and TEE which were negative for any signs of endocarditis.  Subjective  No acute complaints. Eating well. Urinating well. Last BM on Sunday A & P   Deep tissue infection of buttocks with multiple abscesses complicated by MRSA bacteremia, stable.  Status post I&D on 8/14 by Dr. Lucia Gaskins.  Remains afebrile, white count stable at 11,000.   -Continue daptomycin x4 weeks per ID, outpatient follow-up arranged(weekly ck and BMP) -Daily PT hydrotherapy for necrotic tissue management, 8/24 last day recommended per surgical consultants --Appreciate surgery recommendations,  twice daily daily dressing changes, Santyl to wound edges and bases,  And outpatient referral to wound clinic -As needed pain control with oxycodone  AKI on CKD stage IV and persistent moderate hyperkalemia. Creatinine currently stable at his baseline (4.44--per Care[facility]where). Having  adequate output. Peak K of 6.2 that improved with 30 mg total lokelma a few days ago.   No peaked T waves on EKG, remains asymptomatic. Likely due to poor clearance from diminished kidney function; discussed with nephrology and concern that daptomycin can cause hyperkalemia before any rhabdomyolysis.   Renal ultrasound consistent with chronic medical renal disease, also mild left hydronephrosis unchanged from prior CT last month -Outpatient nephrology referral placed--02/19/2020 -Avoid nephrotoxins, monitor BMP and output -renal diet, low potassium -Continue chlorthalidone (hctz not great with GFR),  telemetry --discussed with nephrology, continue multiple dosing lokelma while inpatient, will go home on standing dose of lokelma 10 mg daily until close follow up with nephrology as outpatient -- ID recommends continuing Daptomycin for now, and recheck CK in a few days.    Normocytic anemia, slightly worsening on admission hemoglobin 8.8-9.2.  Currently 7.6 with no signs or symptoms of bleeding.  Suspect anemia of chronic disease related to CKD -Monitor CBC -Check iron panel  Type 2 diabetes, A1c 7.4 -Holding home loperamide -Discontinued Lantus started here for relative hypoglycemia (80, 8/18), fasting blood sugar 110 this a.m. -Continue sliding scale as needed, monitor CBGs  HTN, blood pressure much improved - amlodipine 10 mg  -hydralazine 50 mg 3 times daily - chlothalidone 25 mg, help with BP and hyperkalemia. If BP does not improve can trial lasix 20-40 mg BID or torsemide  (discussed with nephrology)  Family Communication  : None  Code Status : Full  Disposition Plan  :  Patient is from home. Anticipated d/c date: 1 day. Barriers to d/c or necessity for inpatient status: today is last day of hydrotherapy should be ready for dc on 8/25 if we  can get his potassium stable on oral lokelma regimen Consults  : Surgery, ID  Procedures  : I&D on 8/14 by Dr. Lucia Gaskins  DVT Prophylaxis  :  Heparin Lab Results  Component Value Date   PLT 254 02/11/2020    Diet :  Diet Order            Diet renal/carb modified with fluid restriction Diet-HS Snack? Nothing; Fluid restriction: 1200 mL Fluid; Room service appropriate? Yes; Fluid consistency: Thin  Diet effective now                  Inpatient Medications Scheduled Meds:  amLODipine  10 mg Oral Daily   chlorthalidone  25 mg Oral Daily   collagenase   Topical TID   heparin injection (subcutaneous)  5,000 Units Subcutaneous Q8H   hydrALAZINE  50 mg Oral Q8H   insulin aspart  0-15 Units Subcutaneous TID AC & HS   sodium zirconium cyclosilicate  10 g Oral TID   Continuous Infusions:  DAPTOmycin (CUBICIN)  IV 600 mg (02/10/20 2009)   PRN Meds:.acetaminophen **OR** acetaminophen, ondansetron **OR** ondansetron (ZOFRAN) IV, oxyCODONE-acetaminophen **OR** oxyCODONE-acetaminophen, polyethylene glycol, silver nitrate applicators  Antibiotics  :   Anti-infectives (From admission, onward)   Start     Dose/Rate Route Frequency Ordered Stop   02/04/20 2000  DAPTOmycin (CUBICIN) 600 mg in sodium chloride 0.9 % IVPB        600 mg 224 mL/hr over 30 Minutes Intravenous Every 48 hours 02/03/20 1426     02/02/20 1430  DAPTOmycin (CUBICIN) 500 mg in sodium chloride 0.9 % IVPB  Status:  Discontinued        500 mg 220 mL/hr over 30 Minutes Intravenous Every 48 hours 02/02/20 1332 02/03/20 1426   02/01/20 0800  meropenem (MERREM) 500 mg in sodium chloride 0.9 % 100 mL IVPB  Status:  Discontinued        500 mg 200 mL/hr over 30 Minutes Intravenous Every 12 hours 02/01/20 0656 02/02/20 1304   02/01/20 0655  vancomycin variable dose per unstable renal function (pharmacist dosing)  Status:  Discontinued         Does not apply See admin instructions 02/01/20 0656 02/02/20 1305   02/01/20 0300  ceFEPIme (MAXIPIME) 2 g in sodium chloride 0.9 % 100 mL IVPB        2 g 200 mL/hr over 30 Minutes Intravenous  Once 02/01/20 0252 02/01/20  0446   02/01/20 0300  vancomycin (VANCOREADY) IVPB 1500 mg/300 mL        1,500 mg 150 mL/hr over 120 Minutes Intravenous  Once 02/01/20 0252 02/01/20 0624       Objective   Vitals:   02/10/20 1421 02/10/20 2111 02/11/20 0130 02/11/20 0531  BP: (!) 118/59 121/70 120/83 130/65  Pulse: 83 77 75 70  Resp: '18 18 18 17  ' Temp:  98.3 F (36.8 C) 98.6 F (37 C)   TempSrc: Oral Oral Oral   SpO2: 99% 100% 99% 98%  Weight:      Height:        SpO2: 98 % O2 Flow Rate (L/min): 2 L/min  Wt Readings from Last 3 Encounters:  02/04/20 72.3 kg  11/20/19 75.3 kg  05/10/17 83.9 kg     Intake/Output Summary (Last 24 hours) at 02/11/2020 1140 Last data filed at 02/11/2020 0839 Gross per 24 hour  Intake 200 ml  Output 1100 ml  Net -900 ml    Physical Exam:  Awake Alert, Oriented X 3, Normal affect No new F.N deficits,  [place].AT, R chest wall port in place c/d/i Normal respiratory effort on room air, CTAB RRR,No Gallops,Rubs or new Murmurs,  +ve B.Sounds, Abd Soft, No tenderness, No rebound, guarding or rigidity.   I have personally reviewed the following:   Data Reviewed:  CBC Recent Labs  Lab 02/05/20 0959 02/05/20 0959 02/06/20 0354 02/07/20 0337 02/09/20 0852 02/10/20 0801 02/11/20 0425  WBC 10.8*   < > 11.0* 10.1 9.4 6.8 6.9  HGB 7.6*   < > 7.6* 7.6* 7.8* 7.5* 7.5*  HCT 24.1*   < > 24.0* 24.4* 25.5* 24.0* 23.9*  PLT 348   < > 360 351 322 PLATELET CLUMPS NOTED ON SMEAR, UNABLE TO ESTIMATE 254  MCV 94.1   < > 93.4 93.8 94.1 93.8 94.5  MCH 29.7   < > 29.6 29.2 28.8 29.3 29.6  MCHC 31.5   < > 31.7 31.1 30.6 31.3 31.4  RDW 12.4   < > 12.2 12.1 12.4 12.6 12.7  LYMPHSABS 1.8  --  2.1 2.2  --   --   --   MONOABS 0.8  --  0.8 0.7  --   --   --   EOSABS 0.2  --  0.3 0.2  --   --   --   BASOSABS 0.1  --  0.0 0.0  --   --   --    < > = values in this interval not displayed.    Chemistries  Recent Labs  Lab 02/08/20 0842 02/08/20 1158 02/09/20 0201  02/09/20 0852 02/09/20 1502 02/10/20 0801 02/11/20 0425  NA 138  --  137 136  --  135 135  K 6.2*   < > 5.9* 6.3* 4.9 5.5* 5.7*  CL 103  --  102 104  --  102 101  CO2 25  --  25 23  --  22 23  GLUCOSE 164*  --  112* 157*  --  176* 147*  BUN 62*  --  69* 69*  --  72* 79*  CREATININE 3.91*  --  4.18* 4.30*  --  4.38* 4.57*  CALCIUM 8.2*  --  8.2* 8.4*  --  8.2* 8.5*   < > = values in this interval not displayed.   ------------------------------------------------------------------------------------------------------------------ No results for input(s): CHOL, HDL, LDLCALC, TRIG, CHOLHDL, LDLDIRECT in the last 72 hours.  Lab Results  Component Value Date   HGBA1C 7.4 (H) 02/01/2020   ------------------------------------------------------------------------------------------------------------------ No results for input(s): TSH, T4TOTAL, T3FREE, THYROIDAB in the last 72 hours.  Invalid input(s): FREET3 ------------------------------------------------------------------------------------------------------------------ Recent Labs    02/10/20 1534  FERRITIN 473*  TIBC 251  IRON 65    Coagulation profile No results for input(s): INR, PROTIME in the last 168 hours.  No results for input(s): DDIMER in the last 72 hours.  Cardiac Enzymes No results for input(s): CKMB, TROPONINI, MYOGLOBIN in the last 168 hours.  Invalid input(s): CK ------------------------------------------------------------------------------------------------------------------ No results found for: BNP  Micro Results Recent Results (from the past 240 hour(s))  Urine culture     Status: Abnormal (Preliminary result)   Collection Time: 02/02/20  5:05 AM   Specimen: In/Out Cath Urine  Result Value Ref Range Status   Specimen Description IN/OUT CATH URINE  Final   Special Requests NONE  Final   Culture (A)  Final    400 COLONIES/mL GRAM NEGATIVE RODS Sent to [facility] for further susceptibility testing. AND  IDENTIFICATION Performed at [facility]  [facility], [place] [address] [place], [place] [address]    Report Status PENDING  Incomplete  MRSA PCR Screening     Status: None   Collection Time: 02/02/20  5:16 AM   Specimen: Nasal Mucosa; Nasopharyngeal  Result Value Ref Range Status   MRSA by PCR NEGATIVE NEGATIVE Final    Comment:        The GeneXpert MRSA Assay (FDA approved for NASAL specimens only), is one component of a comprehensive MRSA colonization surveillance program. It is not intended to diagnose MRSA infection nor to guide or monitor treatment for MRSA infections. Performed at [facility], [place] [address], [place], [place] [address]   Culture, blood (routine x 2)     Status: None   Collection Time: 02/03/20  4:02 AM   Specimen: BLOOD  Result Value Ref Range Status   Specimen Description BLOOD SITE NOT SPECIFIED  Final   Special Requests   Final    BOTTLES DRAWN AEROBIC ONLY Blood Culture results may not be optimal due to an inadequate volume of blood received in culture bottles   Culture   Final    NO GROWTH 5 DAYS Performed at [facility], [place] [address] [place], [place] [address]    Report Status 02/08/2020 FINAL  Final  Culture, blood (routine x 2)     Status: None   Collection Time: 02/03/20  4:13 AM   Specimen: BLOOD  Result Value Ref Range Status   Specimen Description BLOOD SITE NOT SPECIFIED  Final   Special Requests   Final    BOTTLES DRAWN AEROBIC ONLY Blood Culture results may not be optimal due to an inadequate volume of blood received in culture bottles   Culture   Final    NO GROWTH 5 DAYS Performed at [facility], [place] [address], [place], [place] [address]    Report Status 02/08/2020 FINAL  Final    Radiology Reports CT ABDOMEN PELVIS WO CONTRAST  Result Date: 02/01/2020 CLINICAL DATA:  Abdominal abscess/infection suspected Nausea. Patient reports decreased appetite. Patient reports buttock abscess. EXAM: CT ABDOMEN AND PELVIS  WITHOUT CONTRAST TECHNIQUE: Multidetector CT imaging of the abdomen and pelvis was performed following the standard protocol without IV contrast. COMPARISON:  CT 03/06/2017 FINDINGS: Lower chest: Minimal subpleural scarring in the left lower lobe. No acute airspace disease. No pleural effusion. Hepatobiliary: No focal hepatic abnormality on noncontrast exam. Multiple calcified gallstones without pericholecystic inflammation. There is no biliary dilatation. Pancreas: No ductal dilatation or inflammation. Spleen: Normal in size. No focal abnormality on noncontrast exam. Coils again seen in the splenic artery. Adrenals/Urinary Tract: Previous left adrenal hematoma has resolved, there is mild residual adrenal thickening. Normal right adrenal gland. Mild left hydronephrosis and hydroureter to the level of the distal pelvis. No stone or cause for obstruction. Mild prominence of the right ureter without hydronephrosis. There is symmetric bilateral perinephric edema. Urinary bladder is minimally distended. Stomach/Bowel: Small hiatal hernia. Stomach otherwise unremarkable. There is no bowel obstruction. No evidence of bowel wall thickening or inflammation. Few lower pelvic small bowel loops are fluid-filled but nondilated or inflamed. Normal appendix. High-riding cecum. Moderate volume of colonic stool. There is colonic tortuosity. No colonic wall thickening or inflammation. Vascular/Lymphatic: Abdominal aorta is normal in caliber. Coiling of the splenic artery. Multiple small retroperitoneal lymph nodes, not enlarged by size criteria and likely reactive. Small bilateral inguinal nodes. Reproductive: Prostate unremarkable. Right testis may be within the inguinal canal, series 3, image  86. Other: No ascites or free air. Subcutaneous fluid collection involving the left gluteal soft tissues about the gluteal crease measuring 2.8 x 1.4 x 4.1 cm. Mild adjacent soft tissue edema tracks into the gluteal fold. Fluid collection in  the inferior right gluteal soft tissues abutting the gluteal crease measures 3.0 x 2.4 x 3.8 cm. There is no air within either fluid collection or subcutaneous soft tissues. No extension into the anorectum. Musculoskeletal: Bilateral sacroiliac joint pinning and pubic symphyseal fixation. Remote left transverse process fractures in the lumbar spine. Remote left rib fractures. No acute osseous abnormalities. IMPRESSION: 1. Two gluteal fluid collections, 1 on the right and 1 on the left. Subcutaneous fluid collection involving the left gluteal soft tissues abut the gluteal crease measuring 2.8 x 1.4 x 4.1 cm, suspicious for abscess. Fluid collection in the inferior right gluteal soft tissues abutting the gluteal crease measures 3.0 x 2.4 x 3.8 cm. No air within either fluid collection or subcutaneous soft tissues. 2. Mild left hydronephrosis and hydroureter to the level of the distal pelvis. No stone or cause for obstruction. Bilateral perinephric edema, nonspecific. Decompressed urinary bladder which appears thick walled. 3. Cholelithiasis without gallbladder inflammation. 4. Small hiatal hernia. 5. Right testis may be within the inguinal canal. Recommend correlation with physical exam. Electronically Signed   By: Keith Rake M.D.   On: 02/01/2020 03:39   US RENAL  Result Date: 02/03/2020 CLINICAL DATA:  Acute kidney injury. EXAM: RENAL / URINARY TRACT ULTRASOUND COMPLETE COMPARISON:  CT 02/01/2020, renal ultrasound 01/03/2020 FINDINGS: Right Kidney: Renal measurements: 11.7 x 5.6 x 5.3 cm = volume: 180 mL. Mild increased renal parenchymal echogenicity. No mass or hydronephrosis visualized. Left Kidney: Renal measurements: 10.1 x 5.3 x 5.2 cm = volume: 146 mL. Mild hydronephrosis. Renal pelvis measures 12 mm in dimension. Mild increased renal echogenicity. No evidence of focal lesion. Bladder: Appears normal for degree of bladder distention. Other: Incidental gallstones. IMPRESSION: 1. Mild left  hydronephrosis. This is unchanged from prior CT as well as renal ultrasound last month. Cause for obstruction of stab list. 2. Mild increased renal echogenicity consistent with chronic medical renal disease. Electronically Signed   By: Keith Rake M.D.   On: 02/03/2020 23:07   IR Fluoro Guide CV Line Right  Result Date: 02/05/2020 INDICATION: MRSA bacteremia, cellulitis, access for long-term antibiotics EXAM: TUNNELED PICC LINE WITH ULTRASOUND AND FLUOROSCOPIC GUIDANCE MEDICATIONS: 1% lidocaine local. ANESTHESIA/SEDATION: Moderate Sedation Time:  None. The patient was continuously monitored during the procedure by the interventional radiology nurse under my direct supervision. FLUOROSCOPY TIME:  Fluoroscopy Time: 0 minutes 6 seconds (1 mGy). COMPLICATIONS: None immediate. PROCEDURE: Informed written consent was obtained from the patient after a discussion of the risks, benefits, and alternatives to treatment. Questions regarding the procedure were encouraged and answered. The right neck and chest were prepped with chlorhexidine in a sterile fashion, and a sterile drape was applied covering the operative field. Maximum barrier sterile technique with sterile gowns and gloves were used for the procedure. A timeout was performed prior to the initiation of the procedure. After creating a small venotomy incision, a micropuncture kit was utilized to access the right internal jugular vein under direct, real-time ultrasound guidance after the overlying soft tissues were anesthetized with 1% lidocaine with epinephrine. Ultrasound image documentation was performed. The microwire was kinked to measure appropriate catheter length. The micropuncture sheath was exchanged for a peel-away sheath over a guidewire. A 5 French dual lumen tunneled PICC measuring 25 cm was  tunneled in a retrograde fashion from the anterior chest wall to the venotomy incision. The catheter was then placed through the peel-away sheath with tip  ultimately positioned at the superior caval-atrial junction. Final catheter positioning was confirmed and documented with a spot radiographic image. The catheter aspirates and flushes normally. The catheter was flushed with appropriate volume heparin dwells. The catheter exit site was secured with a 3 0 Ethilon retention suture. The venotomy incision was closed with Dermabond. Dressings were applied. The patient tolerated the procedure well without immediate post procedural complication. FINDINGS: After catheter placement, the tip lies within the superior cavoatrial junction. The catheter aspirates and flushes normally and is ready for immediate use. IMPRESSION: Successful placement of 25cm dual lumen tunneled PICC catheter via the right internal jugular vein with tip terminating at the superior caval atrial junction. The catheter is ready for immediate use. Electronically Signed   By: Jerilynn Mages.  Shick M.D.   On: 02/05/2020 16:09   IR US Guide Vasc Access Right  Result Date: 02/05/2020 INDICATION: MRSA bacteremia, cellulitis, access for long-term antibiotics EXAM: TUNNELED PICC LINE WITH ULTRASOUND AND FLUOROSCOPIC GUIDANCE MEDICATIONS: 1% lidocaine local. ANESTHESIA/SEDATION: Moderate Sedation Time:  None. The patient was continuously monitored during the procedure by the interventional radiology nurse under my direct supervision. FLUOROSCOPY TIME:  Fluoroscopy Time: 0 minutes 6 seconds (1 mGy). COMPLICATIONS: None immediate. PROCEDURE: Informed written consent was obtained from the patient after a discussion of the risks, benefits, and alternatives to treatment. Questions regarding the procedure were encouraged and answered. The right neck and chest were prepped with chlorhexidine in a sterile fashion, and a sterile drape was applied covering the operative field. Maximum barrier sterile technique with sterile gowns and gloves were used for the procedure. A timeout was performed prior to the initiation of the  procedure. After creating a small venotomy incision, a micropuncture kit was utilized to access the right internal jugular vein under direct, real-time ultrasound guidance after the overlying soft tissues were anesthetized with 1% lidocaine with epinephrine. Ultrasound image documentation was performed. The microwire was kinked to measure appropriate catheter length. The micropuncture sheath was exchanged for a peel-away sheath over a guidewire. A 5 French dual lumen tunneled PICC measuring 25 cm was tunneled in a retrograde fashion from the anterior chest wall to the venotomy incision. The catheter was then placed through the peel-away sheath with tip ultimately positioned at the superior caval-atrial junction. Final catheter positioning was confirmed and documented with a spot radiographic image. The catheter aspirates and flushes normally. The catheter was flushed with appropriate volume heparin dwells. The catheter exit site was secured with a 3 0 Ethilon retention suture. The venotomy incision was closed with Dermabond. Dressings were applied. The patient tolerated the procedure well without immediate post procedural complication. FINDINGS: After catheter placement, the tip lies within the superior cavoatrial junction. The catheter aspirates and flushes normally and is ready for immediate use. IMPRESSION: Successful placement of 25cm dual lumen tunneled PICC catheter via the right internal jugular vein with tip terminating at the superior caval atrial junction. The catheter is ready for immediate use. Electronically Signed   By: Jerilynn Mages.  Shick M.D.   On: 02/05/2020 16:09   DG Chest Portable 1 View  Result Date: 02/01/2020 CLINICAL DATA:  Cough. EXAM: PORTABLE CHEST 1 VIEW COMPARISON:  Radiograph 04/01/2017 FINDINGS: Heart is normal in size. Normal mediastinal contours. Remote left rib fractures with minor scarring at the left lung base. No acute or confluent airspace disease. No pleural fluid  or pneumothorax. No  pulmonary edema. Left upper quadrant vascular coils. IMPRESSION: 1. No acute abnormality. 2. Remote left rib fractures with minor scarring at the left lung base. Electronically Signed   By: Keith Rake M.D.   On: 02/01/2020 03:41   ECHOCARDIOGRAM COMPLETE  Result Date: 02/02/2020    ECHOCARDIOGRAM REPORT   Patient Name:   TRAVONTAE FREIBERGER Date of Exam: 02/02/2020 Medical Rec #:  [id number]        Height:       70.0 in Accession #:    [id number]       Weight:       159.4 lb Date of Birth:  [birth year]       BSA:          1.895 m Patient Age:    47 years         BP:           142/84 mmHg Patient Gender: M                HR:           84 bpm. Exam Location:  Inpatient Procedure: 2D Echo Indications:    bacteremia  History:        Patient has no prior history of Echocardiogram examinations.                 Risk Factors:Diabetes and Hypertension.  Sonographer:    Jannett Celestine RDCS (AE) Referring Phys: [place]  1. There is a linear independently mobile echodensity in the LVOT (see image 30). This cannot be clearly traced back to a structure, but aortic valve endocarditis not excluded. Left ventricular ejection fraction, by estimation, is 65 to 70%. The left ventricle has normal function. The left ventricle has no regional wall motion abnormalities. Left ventricular diastolic parameters were normal.  2. Right ventricular systolic function is normal. The right ventricular size is normal. Tricuspid regurgitation signal is inadequate for assessing PA pressure.  3. The mitral valve is normal in structure. Trivial mitral valve regurgitation. No evidence of mitral stenosis.  4. Bicuspid aortic valve, with fusion of the RCC and LCC (image 20) with focal calcification. Mild AI. Marland Kitchen The aortic valve is bicuspid. Aortic valve regurgitation is mild. Mild aortic valve sclerosis is present, with no evidence of aortic valve stenosis.  5. The inferior vena cava is normal in size with greater than 50%  respiratory variability, suggesting right atrial pressure of 3 mmHg. Comparison(s): No prior Echocardiogram. Conclusion(s)/Recommendation(s): Bicuspid aortic valve. There is an independently mobile linear echodensity seen in the LVOT. Given bacteremia, would recommend TEE to further evaluate for endocarditis. FINDINGS  Left Ventricle: There is a linear independently mobile echodensity in the LVOT (see image 30). This cannot be clearly traced back to a structure, but aortic valve endocarditis not excluded. Left ventricular ejection fraction, by estimation, is 65 to 70%. The left ventricle has normal function. The left ventricle has no regional wall motion abnormalities. The left ventricular internal cavity size was normal in size. There is borderline left ventricular hypertrophy. Left ventricular diastolic parameters were normal. Right Ventricle: The right ventricular size is normal. No increase in right ventricular wall thickness. Right ventricular systolic function is normal. Tricuspid regurgitation signal is inadequate for assessing PA pressure. Left Atrium: Left atrial size was normal in size. Right Atrium: Right atrial size was normal in size. Pericardium: There is no evidence of pericardial effusion. Mitral Valve: The mitral valve is normal in structure.  Trivial mitral valve regurgitation. No evidence of mitral valve stenosis. Tricuspid Valve: The tricuspid valve is normal in structure. Tricuspid valve regurgitation is trivial. No evidence of tricuspid stenosis. Aortic Valve: Bicuspid aortic valve, with fusion of the RCC and LCC (image 20) with focal calcification. Mild AI. The aortic valve is bicuspid. . There is mild thickening and mild calcification of the aortic valve. Aortic valve regurgitation is mild. Mild aortic valve sclerosis is present, with no evidence of aortic valve stenosis. There is mild thickening of the aortic valve. There is mild calcification of the aortic valve. Pulmonic Valve: The pulmonic  valve was grossly normal. Pulmonic valve regurgitation is trivial. Aorta: The aortic root was not well visualized, the ascending aorta was not well visualized and the aortic arch was not well visualized. Venous: The inferior vena cava is normal in size with greater than 50% respiratory variability, suggesting right atrial pressure of 3 mmHg. IAS/Shunts: The atrial septum is grossly normal.  LEFT VENTRICLE PLAX 2D LVIDd:         4.70 cm  Diastology LVIDs:         2.80 cm  LV e' lateral:   7.62 cm/s LV PW:         1.10 cm  LV E/e' lateral: 9.7 LV IVS:        1.20 cm  LV e' medial:    5.98 cm/s LVOT diam:     2.20 cm  LV E/e' medial:  12.4 LV SV:         87 LV SV Index:   46 LVOT Area:     3.80 cm  RIGHT VENTRICLE RV S prime:     9.46 cm/s TAPSE (M-mode): 1.7 cm LEFT ATRIUM           Index       RIGHT ATRIUM          Index LA diam:      3.00 cm 1.58 cm/m  RA Area:     8.89 cm LA Vol (A2C): 20.6 ml 10.87 ml/m RA Volume:   15.00 ml 7.91 ml/m LA Vol (A4C): 21.9 ml 11.55 ml/m  AORTIC VALVE LVOT Vmax:   95.30 cm/s LVOT Vmean:  71.000 cm/s LVOT VTI:    0.229 m  AORTA Ao Root diam: 3.60 cm MITRAL VALVE MV Area (PHT): 3.27 cm     SHUNTS MV Decel Time: 232 msec     Systemic VTI:  0.23 m MV E velocity: 74.10 cm/s   Systemic Diam: 2.20 cm MV A velocity: 102.00 cm/s MV E/A ratio:  0.73 Buford Dresser MD Electronically signed by Buford Dresser MD Signature Date/Time: 02/02/2020/4:39:02 PM    Final    ECHO TEE  Result Date: 02/04/2020    TRANSESOPHOGEAL ECHO REPORT   Patient Name:   YAMAN GRAUBERGER Date of Exam: 02/04/2020 Medical Rec #:  [id number]        Height:       70.0 in Accession #:    [id number]       Weight:       159.4 lb Date of Birth:  03-Jan-1961       BSA:          1.895 m Patient Age:    38 years         BP:           156/81 mmHg Patient Gender: M                HR:  64 bpm. Exam Location:  Inpatient Procedure: Transesophageal Echo and Color Doppler Indications:     Bacteremia   History:         Patient has prior history of Echocardiogram examinations, most                  recent 02/02/2020. Risk Factors:Diabetes, Hypertension and                  Dyslipidemia. CKD.  Sonographer:     Clayton Lefort RDCS (AE) Referring Phys:  [id number] [place] Diagnosing Phys: Buford Dresser MD PROCEDURE: After discussion of the risks and benefits of a TEE, an informed consent was obtained from the patient. The transesophogeal probe was passed without difficulty through the esophogus of the patient. Local oropharyngeal anesthetic was provided with viscous lidocaine. Sedation performed by different physician. The patient was monitored while under deep sedation. Anesthestetic sedation was provided intravenously by Anesthesiology: 139.64m of Propofol. Image quality was good. The patient's vital signs; including heart rate, blood pressure, and oxygen saturation; remained stable throughout the procedure. The patient developed no complications during the procedure. IMPRESSIONS  1. Left ventricular ejection fraction, by estimation, is 65 to 70%. The left ventricle has normal function. The left ventricle has no regional wall motion abnormalities.  2. Right ventricular systolic function is normal. The right ventricular size is normal.  3. No left atrial/left atrial appendage thrombus was detected.  4. The mitral valve is normal in structure. Trivial mitral valve regurgitation. No evidence of mitral stenosis.  5. The aortic valve is bicuspid. Aortic valve regurgitation is trivial. No aortic stenosis is present.  6. There is mild (Grade II) plaque involving the descending aorta. Conclusion(s)/Recommendation(s): No evidence of endocarditis. Bicuspid aortic valve. Structure seen on TTE is associated with the mitral valve, not the aortic, near the mitral-aortic annular junction. This appears to be a chordae, not consistent with vegetation. FINDINGS  Left Ventricle: Left ventricular ejection fraction, by  estimation, is 65 to 70%. The left ventricle has normal function. The left ventricle has no regional wall motion abnormalities. The left ventricular internal cavity size was normal in size. There is  borderline left ventricular hypertrophy. Right Ventricle: The right ventricular size is normal. No increase in right ventricular wall thickness. Right ventricular systolic function is normal. Left Atrium: Left atrial size was not assessed. No left atrial/left atrial appendage thrombus was detected. Right Atrium: Right atrial size was not assessed. Pericardium: There is no evidence of pericardial effusion. Mitral Valve: The mitral valve is normal in structure. Trivial mitral valve regurgitation. No evidence of mitral valve stenosis. Tricuspid Valve: The tricuspid valve is normal in structure. Tricuspid valve regurgitation is trivial. No evidence of tricuspid stenosis. Aortic Valve: The aortic valve is bicuspid. Aortic valve regurgitation is trivial. No aortic stenosis is present. Pulmonic Valve: The pulmonic valve was grossly normal. Pulmonic valve regurgitation is trivial. Aorta: The aortic root is normal in size and structure. There is mild (Grade II) plaque involving the descending aorta. IAS/Shunts: No atrial level shunt detected by color flow Doppler. BBuford DresserMD Electronically signed by BBuford DresserMD Signature Date/Time: 02/04/2020/5:29:42 PM    Final      Time Spent in minutes  30     SDesiree HaneM.D on 02/11/2020 at 11:40 AM  To page go to [email or web address] - password [email or web address]

## 2020-02-11 NOTE — Discharge Instructions (Signed)
Wet to Dry WOUND CARE: - Change dressing twice daily - Supplies: sterile saline, kerlex/gauze, scissors, ABD pads, tape  1. Remove dressing and all packing carefully, moistening with sterile saline as needed to avoid packing/internal dressing sticking to the wound. 2.   Clean edges of skin around the wound with water/gauze, making sure there is no tape debris or leakage left on skin that could cause skin irritation or breakdown. 3.   Dampen and clean kerlex with sterile saline and pack wound from wound base to skin level, making sure to take note of any possible areas of wound tracking, tunneling and packing appropriately. Wound can be packed loosely. Trim kerlex to size if a whole kerlex is not required. 4.   Cover wound with a dry ABD pad and secure with tape.  5.   Write the date/time on the dry dressing/tape to better track when the last dressing change occurred. - change dressing as needed if leakage occurs, wound gets contaminated, or patient requests to shower. - You may shower daily with wound open and following the shower the wound should be dried and a clean dressing placed.  - Medical grade tape as well as packing supplies can be found at [employer] on [address] or [employer] on [address]. The remaining supplies can be found at your local drug store, [place] etc.

## 2020-02-11 NOTE — Plan of Care (Signed)
?  Problem: Clinical Measurements: ?Goal: Ability to maintain clinical measurements within normal limits will improve ?Outcome: Progressing ?Goal: Will remain free from infection ?Outcome: Progressing ?Goal: Diagnostic test results will improve ?Outcome: Progressing ?  ?

## 2020-02-11 NOTE — Progress Notes (Signed)
[facility] Progress Note  7 Days Post-Op  Subjective: CC-  Comfortable this morning. Wounds cleaning up well with hydrotherapy.  Objective: Vital signs in last 24 hours: Temp:  [98 F (36.7 C)-98.6 F (37 C)] 98.6 F (37 C) (08/24 0130) Pulse Rate:  [70-83] 70 (08/24 0531) Resp:  [17-18] 17 (08/24 0531) BP: (118-136)/(59-83) 130/65 (08/24 0531) SpO2:  [98 %-100 %] 98 % (08/24 0531) Last BM Date: 02/09/20  Intake/Output from previous day: 08/23 0701 - 08/24 0700 In: 220 [P.O.:220] Out: 1000 [Urine:1000] Intake/Output this shift: Total I/O In: 200 [P.O.:200] Out: 400 [Urine:400]  PE: Gen:  Alert, NAD, pleasant Pulm:  rate and effort normal Skin: all wounds (sacral, right and left gluteal) continue to improve, less fibrinous exudate around edges, tissue is mostly beefy red, no erythema or purulent drainage. Mild induration remains around smaller of two wounds on left buttock  Lab Results:  Recent Labs    02/10/20 0801 02/11/20 0425  WBC 6.8 6.9  HGB 7.5* 7.5*  HCT 24.0* 23.9*  PLT PLATELET CLUMPS NOTED ON SMEAR, UNABLE TO ESTIMATE 254   BMET Recent Labs    02/10/20 0801 02/11/20 0425  NA 135 135  K 5.5* 5.7*  CL 102 101  CO2 22 23  GLUCOSE 176* 147*  BUN 72* 79*  CREATININE 4.38* 4.57*  CALCIUM 8.2* 8.5*   PT/INR No results for input(s): LABPROT, INR in the last 72 hours. CMP     Component Value Date/Time   NA 135 02/11/2020 0425   K 5.7 (H) 02/11/2020 0425   CL 101 02/11/2020 0425   CO2 23 02/11/2020 0425   GLUCOSE 147 (H) 02/11/2020 0425   BUN 79 (H) 02/11/2020 0425   CREATININE 4.57 (H) 02/11/2020 0425   CALCIUM 8.5 (L) 02/11/2020 0425   PROT 5.5 (L) 02/02/2020 0225   ALBUMIN 2.0 (L) 02/03/2020 0402   AST 26 02/02/2020 0225   ALT 29 02/02/2020 0225   ALKPHOS 84 02/02/2020 0225   BILITOT 0.4 02/02/2020 0225   GFRNONAA 13 (L) 02/11/2020 0425   GFRAA 15 (L) 02/11/2020 0425   Lipase  No results found for:  LIPASE     Studies/Results: No results found.  Anti-infectives: Anti-infectives (From admission, onward)   Start     Dose/Rate Route Frequency Ordered Stop   02/04/20 2000  DAPTOmycin (CUBICIN) 600 mg in sodium chloride 0.9 % IVPB        600 mg 224 mL/hr over 30 Minutes Intravenous Every 48 hours 02/03/20 1426     02/02/20 1430  DAPTOmycin (CUBICIN) 500 mg in sodium chloride 0.9 % IVPB  Status:  Discontinued        500 mg 220 mL/hr over 30 Minutes Intravenous Every 48 hours 02/02/20 1332 02/03/20 1426   02/01/20 0800  meropenem (MERREM) 500 mg in sodium chloride 0.9 % 100 mL IVPB  Status:  Discontinued        500 mg 200 mL/hr over 30 Minutes Intravenous Every 12 hours 02/01/20 0656 02/02/20 1304   02/01/20 0655  vancomycin variable dose per unstable renal function (pharmacist dosing)  Status:  Discontinued         Does not apply See admin instructions 02/01/20 0656 02/02/20 1305   02/01/20 0300  ceFEPIme (MAXIPIME) 2 g in sodium chloride 0.9 % 100 mL IVPB        2 g 200 mL/hr over 30 Minutes Intravenous  Once 02/01/20 0252 02/01/20 0446   02/01/20 0300  vancomycin (VANCOREADY) IVPB 1500  mg/300 mL        1,500 mg 150 mL/hr over 120 Minutes Intravenous  Once 02/01/20 0252 02/01/20 0624       Assessment/Plan DM CKD/AKI- Creatinine 4.57 Anemia-Hgb 7.5, stable HTN MRSA bacteremia Hyperkalemia  POD10, s/pIRRIGATION AND DEBRIDEMENT ABSCESSof multiple buttocks abscesses - 8/14 - [place] - cxs revealMRSA. abx per ID - WBC WNL, afebrile - Wounds continue to improve. Will d/c hydrotherapy after today. Continue BID wet to dry dressing changes, santyl. I will place ambulatory referral to outpatient wound clinic. General surgery will sign off, please call with concerns.   FEN - renal/carb mod VTE -sq heparin ID -cubicin Follow up - wound clinic   LOS: 10 days    Wellington [place], [facility] 02/11/2020, 10:13 AM Please see Amion for pager number  during day hours 7:00am-4:30pm

## 2020-02-12 ENCOUNTER — Inpatient Hospital Stay (HOSPITAL_COMMUNITY): Payer: BC Managed Care – PPO

## 2020-02-12 DIAGNOSIS — G459 Transient cerebral ischemic attack, unspecified: Secondary | ICD-10-CM

## 2020-02-12 LAB — BASIC METABOLIC PANEL
Anion gap: 11 (ref 5–15)
BUN: 83 mg/dL — ABNORMAL HIGH (ref 6–20)
CO2: 22 mmol/L (ref 22–32)
Calcium: 8.8 mg/dL — ABNORMAL LOW (ref 8.9–10.3)
Chloride: 101 mmol/L (ref 98–111)
Creatinine, Ser: 4.61 mg/dL — ABNORMAL HIGH (ref 0.61–1.24)
GFR calc Af Amer: 15 mL/min — ABNORMAL LOW (ref >60)
GFR calc non Af Amer: 13 mL/min — ABNORMAL LOW (ref >60)
Glucose, Bld: 128 mg/dL — ABNORMAL HIGH (ref 70–99)
Potassium: 5 mmol/L (ref 3.5–5.1)
Sodium: 134 mmol/L — ABNORMAL LOW (ref 135–145)

## 2020-02-12 LAB — CBC
HCT: 25.6 % — ABNORMAL LOW (ref 39.0–52.0)
Hemoglobin: 7.9 g/dL — ABNORMAL LOW (ref 13.0–17.0)
MCH: 28.7 pg (ref 26.0–34.0)
MCHC: 30.9 g/dL (ref 30.0–36.0)
MCV: 93.1 fL (ref 80.0–100.0)
Platelets: 260 10*3/uL (ref 150–400)
RBC: 2.75 MIL/uL — ABNORMAL LOW (ref 4.22–5.81)
RDW: 12.5 % (ref 11.5–15.5)
WBC: 6.1 10*3/uL (ref 4.0–10.5)
nRBC: 0 % (ref 0.0–0.2)

## 2020-02-12 LAB — CK: Total CK: 95 U/L (ref 49–397)

## 2020-02-12 LAB — GLUCOSE, CAPILLARY
Glucose-Capillary: 122 mg/dL — ABNORMAL HIGH (ref 70–99)
Glucose-Capillary: 153 mg/dL — ABNORMAL HIGH (ref 70–99)
Glucose-Capillary: 151 mg/dL — ABNORMAL HIGH (ref 70–99)
Glucose-Capillary: 210 mg/dL — ABNORMAL HIGH (ref 70–99)
Glucose-Capillary: 218 mg/dL — ABNORMAL HIGH (ref 70–99)

## 2020-02-12 MED ORDER — DAPTOMYCIN IV (FOR PTA / DISCHARGE USE ONLY): 600.0000 mg | INTRAVENOUS | 0 refills | Status: AC

## 2020-02-12 MED ORDER — HYDRALAZINE HCL 50 MG PO TABS: 50.0000 mg | ORAL_TABLET | Freq: Three times a day (TID) | ORAL | 0 refills | Status: DC

## 2020-02-12 MED ORDER — CHLORTHALIDONE 25 MG PO TABS: 25.0000 mg | ORAL_TABLET | Freq: Every day | ORAL | 0 refills | Status: AC

## 2020-02-12 MED ORDER — SODIUM ZIRCONIUM CYCLOSILICATE 10 G PO PACK: 10.0000 g | PACK | Freq: Every day | ORAL | 0 refills | Status: AC

## 2020-02-12 MED ORDER — OXYCODONE-ACETAMINOPHEN 5-325 MG PO TABS
1.0000 | ORAL_TABLET | ORAL | 0 refills | Status: AC | PRN
Start: 2020-02-12 — End: 2020-02-17

## 2020-02-12 MED ORDER — SODIUM CHLORIDE 0.9% FLUSH
10.0000 mL | INTRAVENOUS | Status: DC | PRN
  Administered 2020-02-14: 10 mL

## 2020-02-12 MED ORDER — SODIUM CHLORIDE 0.9 % IV SOLN
600.0000 mg | INTRAVENOUS | Status: DC
  Administered 2020-02-12 – 2020-02-14 (×2): 600 mg via INTRAVENOUS
  Filled 2020-02-12 (×2): qty 12

## 2020-02-12 MED ORDER — AMLODIPINE BESYLATE 10 MG PO TABS
10.0000 mg | ORAL_TABLET | Freq: Every day | ORAL | 0 refills | Status: DC
Start: 2020-02-12 — End: 2020-02-14

## 2020-02-12 MED ORDER — CHLORHEXIDINE GLUCONATE CLOTH 2 % EX PADS
6.0000 | MEDICATED_PAD | Freq: Every day | CUTANEOUS | Status: DC
  Administered 2020-02-12 – 2020-02-14 (×3): 6 via TOPICAL

## 2020-02-12 NOTE — Code Documentation (Signed)
Dr. Cheral Marker notified MRI complete. Patient NIHSS 0 and taken back to 6N28. RN at bedside will continue to monitor closely. Wife at bedside updated.

## 2020-02-12 NOTE — TOC Progression Note (Signed)
Transition of Care [facility]) - Progression Note    Patient Details  Name: Nil Xiong MRN: [id number] Date of Birth: [birth year]  [facility] CM/SW Contact  Jacalyn Lefevre Edson Snowball, RN Phone Number: 02/12/2020, 10:33 AM  Clinical Narrative:     Patient to discharge today. Hospital nurse will give today's dose of Cubin and teach family wound care prior to discharge.   Cory with [facility] aware discharge is today , start of care will be Friday.   Pam with [facility] aware discharge is today and will have wife flush PICC tomorrow.   Expected Discharge Plan: [place] Barriers to Discharge: Continued Medical Work up  Expected Discharge Plan and Services Expected Discharge Plan: [place]   Discharge Planning Services: CM Consult Post Acute Care Choice: [place] arrangements for the past 2 months: Single Family Home Expected Discharge Date: 02/12/20               DME Arranged: N/A         HH Arranged: RN HH Agency: [place] Date [facility] Agency Contacted: 02/06/20 Time [place]: [place] Representative spoke with at [place]: [place] ([place]) Interventions    Readmission Risk Interventions No flowsheet data found.

## 2020-02-12 NOTE — Discharge Summary (Signed)
Physician Discharge Summary  Peter [name] RCV:[id number] DOB: 1961-06-06 DOA: 01/31/2020  PCP: Raelene Bott, MD  Admit date: 01/31/2020 Discharge date: 02/14/2020  Admitted From: Home Disposition:  Home with home health  Recommendations for Outpatient Follow-up:  1. Follow up with PCP in 1 week 2. Follow up with Nephrology as scheduled on 9/1  3. Follow up with ID with weekly CK and BMP with follow up in 4 weeks  4. Outpatient referral to wound clinic placed 5. BID wet to dry dressing changes   Discharge Condition: Stable CODE STATUS: Full  Diet recommendation:  Diet Orders (From admission, onward)    Start     Ordered   02/07/20 1025  Diet renal/carb modified with fluid restriction Diet-HS Snack? Nothing; Fluid restriction: 1200 mL Fluid; Room service appropriate? Yes; Fluid consistency: Thin  Diet effective now       Question Answer Comment  Diet-HS Snack? Nothing   Fluid restriction: 1200 mL Fluid   Room service appropriate? Yes   Fluid consistency: Thin      02/07/20 0831          Brief/Interim Summary: Peter Kitchens Broweris a 59 y.o. malewith medical history significant for uncontrolled type 2 diabetes, diabetic nephropathy, HTN, HLD, CKD stage IV who presented on 01/31/20 with chief complaint of subjective chills, generalized weakness, nausea and vomiting.  He was found to have multiple draining ulcers of the buttocks for the past week and was found to have leukocytosis, AKI with creatinine of 6, and CT imaging consistent with gluteal fluid collections concerning for abscesses that required debridement by Dr. Lucia Gaskins with surgery on 8/14. Patient was found to have MRSA bacteremia as well as staph aureus growing from cultures from the abscess. Empiric vancomycin and meropenem were changed to daptomycin due to acute on chronic kidney disease and culture data with ID consultants assistance. Patient underwent TTE and TEE which were negative for any signs of endocarditis.  He  will be discharged on IV daptomycin.  He was supposed to discharge on 8/25, however as he was being wheeled out of the unit, he became unresponsive.  Code stroke was called.  MRI was negative for stroke.  It was thought that this episode was secondary to orthostatic hypotension.  His blood pressure medications were readjusted.  On day of discharge on 8/27, he was doing well, orthostatic hypotension resolved and vital signs stable.  Discharge Diagnoses:  Principal Problem:   Cellulitis and abscess of buttock Active Problems:   Uncontrolled type 2 diabetes mellitus with diabetic nephropathy, without long-term current use of insulin (HCC)   Hyperkalemia   Essential hypertension   Mixed diabetic hyperlipidemia associated with type 2 diabetes mellitus ([place])   Acute renal failure superimposed on stage 4 chronic kidney disease (HCC)   Metabolic acidosis   Sepsis with acute renal failure without septic shock (HCC)   Deep tissue infection of buttocks with multiple abscesses complicated by MRSA bacteremia -Status post I&D on 8/14 by Dr. Lucia Gaskins -Completed daily PT hydrotherapy, last day 8/24.  Continue twice daily dressing changes at home.  Outpatient referral to wound clinic placed -Continue daptomycin x4 weeks per ID, outpatient follow-up arranged (weekly ck and BMP)  AKI on CKD stage IV and persistent moderate hyperkalemia -Creatinine currently stable at his baseline (4.44--per Care[facility]where). -Outpatient nephrology referral placed -Continue lokelma 10 mg daily until close follow up with nephrology as outpatient  Orthostatic hypotension -Blood pressure medication doses adjusted.  Repeat orthostatic vital sign this morning is stable and patient  remains asymptomatic  Acute encephalopathy, metabolic -MRI brain, MRA head and neck negative for stroke.  Neurology consulted for code stroke initially  -Resolved and back to his baseline  Normocytic anemia -Stable  Type 2 diabetes, well  controlled -A1c 7.4 -SSI Novolog   Essential hypertension -Continue amlodipine, hydralazine, chlorthalidone.  Dose adjusted as above     Discharge Instructions  Discharge Instructions    AMB referral to wound care center   Complete by: As directed    Multiple buttock wounds   [facility] pharmacist to adjust dose for Vancomycin, Aminoglycosides and other anti-infective therapies as requested by physician.   Complete by: As directed    [facility] to provide Cath Flo 17m   Complete by: As directed    Administer for PICC line occlusion and as ordered by physician for other access device issues.   Anaphylaxis Kit: Provided to treat any anaphylactic reaction to the medication being provided to the patient if First Dose or when requested by physician   Complete by: As directed    Epinephrine 154mml vial / amp: Administer 0.1m8m0.1ml2mubcutaneously once for moderate to severe anaphylaxis, nurse to call physician and pharmacy when reaction occurs and call 911 if needed for immediate care   Diphenhydramine 50mg55mIV vial: Administer 25-50mg 48mM PRN for first dose reaction, rash, itching, mild reaction, nurse to call physician and pharmacy when reaction occurs   Sodium Chloride 0.9% NS 500ml I22mdminister if needed for hypovolemic blood pressure drop or as ordered by physician after call to physician with anaphylactic reaction   Call MD for:  difficulty breathing, headache or visual disturbances   Complete by: As directed    Call MD for:  extreme fatigue   Complete by: As directed    Call MD for:  hives   Complete by: As directed    Call MD for:  persistant dizziness or light-headedness   Complete by: As directed    Call MD for:  persistant nausea and vomiting   Complete by: As directed    Call MD for:  redness, tenderness, or signs of infection (pain, swelling, redness, odor or green/yellow discharge around incision site)   Complete by: As directed    Call  MD for:  severe uncontrolled pain   Complete by: As directed    Call MD for:  temperature >100.4   Complete by: As directed    Change dressing on IV access line weekly and PRN   Complete by: As directed    Discharge instructions   Complete by: As directed    You were cared for by a hospitalist during your hospital stay. If you have any questions about your discharge medications or the care you received while you were in the hospital after you are discharged, you can call the unit and ask to speak with the hospitalist on call if the hospitalist that took care of you is not available. Once you are discharged, your primary care physician will handle any further medical issues. Please note that NO REFILLS for any discharge medications will be authorized once you are discharged, as it is imperative that you return to your primary care physician (or establish a relationship with a primary care physician if you do not have one) for your aftercare needs so that they can reassess your need for medications and monitor your lab values.   Discharge wound care:   Complete by: As directed    Change dressing BID, santyl followed by saline  damp to dry gauze   Discharge wound care:   Complete by: As directed    Change dressing twice daily, santyl followed by saline damp to dry gauze   Flush IV access with Sodium Chloride 0.9% and Heparin 10 units/ml or 100 units/ml   Complete by: As directed    Home infusion instructions - [facility]   Complete by: As directed    Instructions: Flush IV access with Sodium Chloride 0.9% and Heparin 10units/ml or 100units/ml   Change dressing on IV access line: Weekly and PRN   Instructions Cath Flo 41m: Administer for PICC Line occlusion and as ordered by physician for other access device   [facility] pharmacist to adjust dose for: Vancomycin, Aminoglycosides and other anti-infective therapies as requested by physician   Increase activity slowly    Complete by: As directed    Method of administration may be changed at the discretion of home infusion pharmacist based upon assessment of the patient and/or caregiver's ability to self-administer the medication ordered   Complete by: As directed      Allergies as of 02/14/2020   No Known Allergies     Medication List    STOP taking these medications   metoprolol succinate 100 MG 24 hr tablet Commonly known as: TOPROL-XL   metoprolol tartrate 50 MG tablet Commonly known as: LOPRESSOR   ramipril 5 MG capsule Commonly known as: ALTACE   simvastatin 20 MG tablet Commonly known as: ZOCOR     TAKE these medications   amLODipine 5 MG tablet Commonly known as: NORVASC Take 1 tablet (5 mg total) by mouth daily.   aspirin 81 MG EC tablet Take 1 tablet (81 mg total) by mouth daily. Swallow whole.   chlorthalidone 25 MG tablet Commonly known as: HYGROTON Take 1 tablet (25 mg total) by mouth daily.   daptomycin  IVPB Commonly known as: CUBICIN Inject 600 mg into the vein every other day for 25 days. Indication:  MRSA bacteremia/deep tissue wound of buttocks First Dose: Yes Last Day of Therapy:  03/02/2020 Labs - Once weekly:  CBC/D, BMP, and CPK Labs - Every other week:  ESR and CRP Method of administration: IV Push Method of administration may be changed at the discretion of home infusion pharmacist based upon assessment of the patient and/or caregiver's ability to self-administer the medication ordered.   glimepiride 2 MG tablet Commonly known as: AMARYL Take 1 tablet (2 mg total) by mouth daily with breakfast. What changed: when to take this   hydrALAZINE 25 MG tablet Commonly known as: APRESOLINE Take 2 tablets (50 mg total) by mouth every 8 (eight) hours.   oxyCODONE-acetaminophen 5-325 MG tablet Commonly known as: PERCOCET/ROXICET Take 1 tablet by mouth every 4 (four) hours as needed for up to 5 days for moderate pain.   sodium zirconium cyclosilicate 10 g Pack  packet Commonly known as: LOKELMA Take 10 g by mouth daily for 7 days.            Discharge Care Instructions  (From admission, onward)         Start     Ordered   02/14/20 0000  Discharge wound care:       Comments: Change dressing twice daily, santyl followed by saline damp to dry gauze   02/14/20 1018   02/12/20 0000  Change dressing on IV access line weekly and PRN  (Home infusion instructions - [facility] )        02/12/20  0370   02/12/20 0000  Discharge wound care:       Comments: Change dressing BID, santyl followed by saline damp to dry gauze   02/12/20 0925          Follow-up Information    [place], [place]. Schedule an appointment as soon as possible for a visit in 1 week(s).   Contact information: [place] [address] [phone]        Raelene Bott, MD. Schedule an appointment as soon as possible for a visit in 1 week(s).   Specialty: Internal Medicine Contact information: [address] [place] [address] [place]             . Schedule an appointment as soon as possible for a visit in 4 week(s).   Contact information: [address] [place] [address]       [facility] Follow up.   Specialty: Home Health Services Contact information: [place] [address] [phone]              No Known Allergies  Consultations:  ID  IR  Gen surg  Nephrology    Procedures/Studies: CT ABDOMEN PELVIS WO CONTRAST  Result Date: 02/01/2020 CLINICAL DATA:  Abdominal abscess/infection suspected Nausea. Patient reports decreased appetite. Patient reports buttock abscess. EXAM: CT ABDOMEN AND PELVIS WITHOUT CONTRAST TECHNIQUE: Multidetector CT imaging of the abdomen and pelvis was performed following the standard protocol without IV contrast. COMPARISON:  CT 03/06/2017 FINDINGS:  Lower chest: Minimal subpleural scarring in the left lower lobe. No acute airspace disease. No pleural effusion. Hepatobiliary: No focal hepatic abnormality on noncontrast exam. Multiple calcified gallstones without pericholecystic inflammation. There is no biliary dilatation. Pancreas: No ductal dilatation or inflammation. Spleen: Normal in size. No focal abnormality on noncontrast exam. Coils again seen in the splenic artery. Adrenals/Urinary Tract: Previous left adrenal hematoma has resolved, there is mild residual adrenal thickening. Normal right adrenal gland. Mild left hydronephrosis and hydroureter to the level of the distal pelvis. No stone or cause for obstruction. Mild prominence of the right ureter without hydronephrosis. There is symmetric bilateral perinephric edema. Urinary bladder is minimally distended. Stomach/Bowel: Small hiatal hernia. Stomach otherwise unremarkable. There is no bowel obstruction. No evidence of bowel wall thickening or inflammation. Few lower pelvic small bowel loops are fluid-filled but nondilated or inflamed. Normal appendix. High-riding cecum. Moderate volume of colonic stool. There is colonic tortuosity. No colonic wall thickening or inflammation. Vascular/Lymphatic: Abdominal aorta is normal in caliber. Coiling of the splenic artery. Multiple small retroperitoneal lymph nodes, not enlarged by size criteria and likely reactive. Small bilateral inguinal nodes. Reproductive: Prostate unremarkable. Right testis may be within the inguinal canal, series 3, image 86. Other: No ascites or free air. Subcutaneous fluid collection involving the left gluteal soft tissues about the gluteal crease measuring 2.8 x 1.4 x 4.1 cm. Mild adjacent soft tissue edema tracks into the gluteal fold. Fluid collection in the inferior right gluteal soft tissues abutting the gluteal crease measures 3.0 x 2.4 x 3.8 cm. There is no air within either fluid collection or subcutaneous soft tissues. No  extension into the anorectum. Musculoskeletal: Bilateral sacroiliac joint pinning and pubic symphyseal fixation. Remote left transverse process fractures in the lumbar spine. Remote left rib fractures. No acute osseous abnormalities. IMPRESSION: 1. Two gluteal fluid collections, 1 on the right and 1  on the left. Subcutaneous fluid collection involving the left gluteal soft tissues abut the gluteal crease measuring 2.8 x 1.4 x 4.1 cm, suspicious for abscess. Fluid collection in the inferior right gluteal soft tissues abutting the gluteal crease measures 3.0 x 2.4 x 3.8 cm. No air within either fluid collection or subcutaneous soft tissues. 2. Mild left hydronephrosis and hydroureter to the level of the distal pelvis. No stone or cause for obstruction. Bilateral perinephric edema, nonspecific. Decompressed urinary bladder which appears thick walled. 3. Cholelithiasis without gallbladder inflammation. 4. Small hiatal hernia. 5. Right testis may be within the inguinal canal. Recommend correlation with physical exam. Electronically Signed   By: Keith Rake M.D.   On: 02/01/2020 03:39   MR ANGIO HEAD WO CONTRAST  Result Date: 02/12/2020 CLINICAL DATA:  Sudden onset of right-sided weakness. EXAM: MRI HEAD WITHOUT CONTRAST MRA HEAD WITHOUT CONTRAST MRA NECK WITHOUT CONTRAST TECHNIQUE: Multiplanar, multiecho pulse sequences of the brain and surrounding structures were obtained without intravenous contrast. Angiographic images of the Circle of Willis were obtained using MRA technique without intravenous contrast. Angiographic images of the neck were obtained using MRA technique without intravenous contrast. Carotid stenosis measurements (when applicable) are obtained utilizing NASCET criteria, using the distal internal carotid diameter as the denominator. COMPARISON:  CT angiography 02/11/2017.  MRI 02/14/2017. FINDINGS: MRI HEAD FINDINGS Brain: Diffusion imaging does not show any acute or subacute infarction. No  focal abnormality affects the cerebellum. Mild chronic small-vessel change of the pons. Cerebral hemispheres show mild small vessel change of the hemispheric white matter. No cortical or large vessel territory infarction. No mass lesion, hemorrhage, hydrocephalus or extra-axial collection. Vascular: Major vessels at the base of the brain show flow. Skull and upper cervical spine: Negative Sinuses/Orbits: Clear/normal Other: None MRA HEAD FINDINGS Both internal carotid arteries are widely patent into the brain. No siphon stenosis. The anterior and middle cerebral vessels are patent without proximal stenosis, aneurysm or vascular malformation. Fetal origin right PCA. Both vertebral arteries are widely patent to the basilar. No basilar stenosis. Posterior circulation branch vessels appear normal. MRA NECK FINDINGS Both common carotid arteries widely patent to the bifurcation. Both carotid bifurcations appear normal without stenosis or irregularity. Cervical internal carotid arteries appear widely patent and normal. Both vertebral artery origins appear widely patent. Both vertebral arteries appear normal through the cervical region to the foramen magnum. IMPRESSION: 1. No acute finding by MRI. Mild chronic small-vessel change of the pons and cerebral hemispheric white matter. 2. Normal intracranial MR angiography of the large and medium size vessels. 3. Normal MR angiography of the neck vessels. Electronically Signed   By: Nelson Chimes M.D.   On: 02/12/2020 15:14   MR ANGIO NECK WO CONTRAST  Result Date: 02/12/2020 CLINICAL DATA:  Sudden onset of right-sided weakness. EXAM: MRI HEAD WITHOUT CONTRAST MRA HEAD WITHOUT CONTRAST MRA NECK WITHOUT CONTRAST TECHNIQUE: Multiplanar, multiecho pulse sequences of the brain and surrounding structures were obtained without intravenous contrast. Angiographic images of the Circle of Willis were obtained using MRA technique without intravenous contrast. Angiographic images of the  neck were obtained using MRA technique without intravenous contrast. Carotid stenosis measurements (when applicable) are obtained utilizing NASCET criteria, using the distal internal carotid diameter as the denominator. COMPARISON:  CT angiography 02/11/2017.  MRI 02/14/2017. FINDINGS: MRI HEAD FINDINGS Brain: Diffusion imaging does not show any acute or subacute infarction. No focal abnormality affects the cerebellum. Mild chronic small-vessel change of the pons. Cerebral hemispheres show mild small vessel change of the  hemispheric white matter. No cortical or large vessel territory infarction. No mass lesion, hemorrhage, hydrocephalus or extra-axial collection. Vascular: Major vessels at the base of the brain show flow. Skull and upper cervical spine: Negative Sinuses/Orbits: Clear/normal Other: None MRA HEAD FINDINGS Both internal carotid arteries are widely patent into the brain. No siphon stenosis. The anterior and middle cerebral vessels are patent without proximal stenosis, aneurysm or vascular malformation. Fetal origin right PCA. Both vertebral arteries are widely patent to the basilar. No basilar stenosis. Posterior circulation branch vessels appear normal. MRA NECK FINDINGS Both common carotid arteries widely patent to the bifurcation. Both carotid bifurcations appear normal without stenosis or irregularity. Cervical internal carotid arteries appear widely patent and normal. Both vertebral artery origins appear widely patent. Both vertebral arteries appear normal through the cervical region to the foramen magnum. IMPRESSION: 1. No acute finding by MRI. Mild chronic small-vessel change of the pons and cerebral hemispheric white matter. 2. Normal intracranial MR angiography of the large and medium size vessels. 3. Normal MR angiography of the neck vessels. Electronically Signed   By: Nelson Chimes M.D.   On: 02/12/2020 15:14   MR BRAIN WO CONTRAST  Result Date: 02/12/2020 CLINICAL DATA:  Sudden onset of  right-sided weakness. EXAM: MRI HEAD WITHOUT CONTRAST MRA HEAD WITHOUT CONTRAST MRA NECK WITHOUT CONTRAST TECHNIQUE: Multiplanar, multiecho pulse sequences of the brain and surrounding structures were obtained without intravenous contrast. Angiographic images of the Circle of Willis were obtained using MRA technique without intravenous contrast. Angiographic images of the neck were obtained using MRA technique without intravenous contrast. Carotid stenosis measurements (when applicable) are obtained utilizing NASCET criteria, using the distal internal carotid diameter as the denominator. COMPARISON:  CT angiography 02/11/2017.  MRI 02/14/2017. FINDINGS: MRI HEAD FINDINGS Brain: Diffusion imaging does not show any acute or subacute infarction. No focal abnormality affects the cerebellum. Mild chronic small-vessel change of the pons. Cerebral hemispheres show mild small vessel change of the hemispheric white matter. No cortical or large vessel territory infarction. No mass lesion, hemorrhage, hydrocephalus or extra-axial collection. Vascular: Major vessels at the base of the brain show flow. Skull and upper cervical spine: Negative Sinuses/Orbits: Clear/normal Other: None MRA HEAD FINDINGS Both internal carotid arteries are widely patent into the brain. No siphon stenosis. The anterior and middle cerebral vessels are patent without proximal stenosis, aneurysm or vascular malformation. Fetal origin right PCA. Both vertebral arteries are widely patent to the basilar. No basilar stenosis. Posterior circulation branch vessels appear normal. MRA NECK FINDINGS Both common carotid arteries widely patent to the bifurcation. Both carotid bifurcations appear normal without stenosis or irregularity. Cervical internal carotid arteries appear widely patent and normal. Both vertebral artery origins appear widely patent. Both vertebral arteries appear normal through the cervical region to the foramen magnum. IMPRESSION: 1. No acute  finding by MRI. Mild chronic small-vessel change of the pons and cerebral hemispheric white matter. 2. Normal intracranial MR angiography of the large and medium size vessels. 3. Normal MR angiography of the neck vessels. Electronically Signed   By: Nelson Chimes M.D.   On: 02/12/2020 15:14   US RENAL  Result Date: 02/03/2020 CLINICAL DATA:  Acute kidney injury. EXAM: RENAL / URINARY TRACT ULTRASOUND COMPLETE COMPARISON:  CT 02/01/2020, renal ultrasound 01/03/2020 FINDINGS: Right Kidney: Renal measurements: 11.7 x 5.6 x 5.3 cm = volume: 180 mL. Mild increased renal parenchymal echogenicity. No mass or hydronephrosis visualized. Left Kidney: Renal measurements: 10.1 x 5.3 x 5.2 cm = volume: 146 mL. Mild hydronephrosis.  Renal pelvis measures 12 mm in dimension. Mild increased renal echogenicity. No evidence of focal lesion. Bladder: Appears normal for degree of bladder distention. Other: Incidental gallstones. IMPRESSION: 1. Mild left hydronephrosis. This is unchanged from prior CT as well as renal ultrasound last month. Cause for obstruction of stab list. 2. Mild increased renal echogenicity consistent with chronic medical renal disease. Electronically Signed   By: Keith Rake M.D.   On: 02/03/2020 23:07   IR Fluoro Guide CV Line Right  Result Date: 02/05/2020 INDICATION: MRSA bacteremia, cellulitis, access for long-term antibiotics EXAM: TUNNELED PICC LINE WITH ULTRASOUND AND FLUOROSCOPIC GUIDANCE MEDICATIONS: 1% lidocaine local. ANESTHESIA/SEDATION: Moderate Sedation Time:  None. The patient was continuously monitored during the procedure by the interventional radiology nurse under my direct supervision. FLUOROSCOPY TIME:  Fluoroscopy Time: 0 minutes 6 seconds (1 mGy). COMPLICATIONS: None immediate. PROCEDURE: Informed written consent was obtained from the patient after a discussion of the risks, benefits, and alternatives to treatment. Questions regarding the procedure were encouraged and answered. The  right neck and chest were prepped with chlorhexidine in a sterile fashion, and a sterile drape was applied covering the operative field. Maximum barrier sterile technique with sterile gowns and gloves were used for the procedure. A timeout was performed prior to the initiation of the procedure. After creating a small venotomy incision, a micropuncture kit was utilized to access the right internal jugular vein under direct, real-time ultrasound guidance after the overlying soft tissues were anesthetized with 1% lidocaine with epinephrine. Ultrasound image documentation was performed. The microwire was kinked to measure appropriate catheter length. The micropuncture sheath was exchanged for a peel-away sheath over a guidewire. A 5 French dual lumen tunneled PICC measuring 25 cm was tunneled in a retrograde fashion from the anterior chest wall to the venotomy incision. The catheter was then placed through the peel-away sheath with tip ultimately positioned at the superior caval-atrial junction. Final catheter positioning was confirmed and documented with a spot radiographic image. The catheter aspirates and flushes normally. The catheter was flushed with appropriate volume heparin dwells. The catheter exit site was secured with a 3 0 Ethilon retention suture. The venotomy incision was closed with Dermabond. Dressings were applied. The patient tolerated the procedure well without immediate post procedural complication. FINDINGS: After catheter placement, the tip lies within the superior cavoatrial junction. The catheter aspirates and flushes normally and is ready for immediate use. IMPRESSION: Successful placement of 25cm dual lumen tunneled PICC catheter via the right internal jugular vein with tip terminating at the superior caval atrial junction. The catheter is ready for immediate use. Electronically Signed   By: Jerilynn Mages.  Shick M.D.   On: 02/05/2020 16:09   IR US Guide Vasc Access Right  Result Date:  02/05/2020 INDICATION: MRSA bacteremia, cellulitis, access for long-term antibiotics EXAM: TUNNELED PICC LINE WITH ULTRASOUND AND FLUOROSCOPIC GUIDANCE MEDICATIONS: 1% lidocaine local. ANESTHESIA/SEDATION: Moderate Sedation Time:  None. The patient was continuously monitored during the procedure by the interventional radiology nurse under my direct supervision. FLUOROSCOPY TIME:  Fluoroscopy Time: 0 minutes 6 seconds (1 mGy). COMPLICATIONS: None immediate. PROCEDURE: Informed written consent was obtained from the patient after a discussion of the risks, benefits, and alternatives to treatment. Questions regarding the procedure were encouraged and answered. The right neck and chest were prepped with chlorhexidine in a sterile fashion, and a sterile drape was applied covering the operative field. Maximum barrier sterile technique with sterile gowns and gloves were used for the procedure. A timeout was performed prior to  the initiation of the procedure. After creating a small venotomy incision, a micropuncture kit was utilized to access the right internal jugular vein under direct, real-time ultrasound guidance after the overlying soft tissues were anesthetized with 1% lidocaine with epinephrine. Ultrasound image documentation was performed. The microwire was kinked to measure appropriate catheter length. The micropuncture sheath was exchanged for a peel-away sheath over a guidewire. A 5 French dual lumen tunneled PICC measuring 25 cm was tunneled in a retrograde fashion from the anterior chest wall to the venotomy incision. The catheter was then placed through the peel-away sheath with tip ultimately positioned at the superior caval-atrial junction. Final catheter positioning was confirmed and documented with a spot radiographic image. The catheter aspirates and flushes normally. The catheter was flushed with appropriate volume heparin dwells. The catheter exit site was secured with a 3 0 Ethilon retention suture. The  venotomy incision was closed with Dermabond. Dressings were applied. The patient tolerated the procedure well without immediate post procedural complication. FINDINGS: After catheter placement, the tip lies within the superior cavoatrial junction. The catheter aspirates and flushes normally and is ready for immediate use. IMPRESSION: Successful placement of 25cm dual lumen tunneled PICC catheter via the right internal jugular vein with tip terminating at the superior caval atrial junction. The catheter is ready for immediate use. Electronically Signed   By: Jerilynn Mages.  Shick M.D.   On: 02/05/2020 16:09   DG Chest Portable 1 View  Result Date: 02/01/2020 CLINICAL DATA:  Cough. EXAM: PORTABLE CHEST 1 VIEW COMPARISON:  Radiograph 04/01/2017 FINDINGS: Heart is normal in size. Normal mediastinal contours. Remote left rib fractures with minor scarring at the left lung base. No acute or confluent airspace disease. No pleural fluid or pneumothorax. No pulmonary edema. Left upper quadrant vascular coils. IMPRESSION: 1. No acute abnormality. 2. Remote left rib fractures with minor scarring at the left lung base. Electronically Signed   By: Keith Rake M.D.   On: 02/01/2020 03:41   ECHOCARDIOGRAM COMPLETE  Result Date: 02/02/2020    ECHOCARDIOGRAM REPORT   Patient Name:   Peter [name] Date of Exam: 02/02/2020 Medical Rec #:  [id number]        Height:       70.0 in Accession #:    [id number]       Weight:       159.4 lb Date of Birth:  [birth year]       BSA:          1.895 m Patient Age:    2 years         BP:           142/84 mmHg Patient Gender: M                HR:           84 bpm. Exam Location:  Inpatient Procedure: 2D Echo Indications:    bacteremia  History:        Patient has no prior history of Echocardiogram examinations.                 Risk Factors:Diabetes and Hypertension.  Sonographer:    Jannett Celestine RDCS (AE) Referring Phys: [place]  1. There is a linear independently mobile  echodensity in the LVOT (see image 30). This cannot be clearly traced back to a structure, but aortic valve endocarditis not excluded. Left ventricular ejection fraction, by estimation, is 65 to 70%. The left ventricle has normal function. The  left ventricle has no regional wall motion abnormalities. Left ventricular diastolic parameters were normal.  2. Right ventricular systolic function is normal. The right ventricular size is normal. Tricuspid regurgitation signal is inadequate for assessing PA pressure.  3. The mitral valve is normal in structure. Trivial mitral valve regurgitation. No evidence of mitral stenosis.  4. Bicuspid aortic valve, with fusion of the RCC and LCC (image 20) with focal calcification. Mild AI. Marland Kitchen The aortic valve is bicuspid. Aortic valve regurgitation is mild. Mild aortic valve sclerosis is present, with no evidence of aortic valve stenosis.  5. The inferior vena cava is normal in size with greater than 50% respiratory variability, suggesting right atrial pressure of 3 mmHg. Comparison(s): No prior Echocardiogram. Conclusion(s)/Recommendation(s): Bicuspid aortic valve. There is an independently mobile linear echodensity seen in the LVOT. Given bacteremia, would recommend TEE to further evaluate for endocarditis. FINDINGS  Left Ventricle: There is a linear independently mobile echodensity in the LVOT (see image 30). This cannot be clearly traced back to a structure, but aortic valve endocarditis not excluded. Left ventricular ejection fraction, by estimation, is 65 to 70%. The left ventricle has normal function. The left ventricle has no regional wall motion abnormalities. The left ventricular internal cavity size was normal in size. There is borderline left ventricular hypertrophy. Left ventricular diastolic parameters were normal. Right Ventricle: The right ventricular size is normal. No increase in right ventricular wall thickness. Right ventricular systolic function is normal.  Tricuspid regurgitation signal is inadequate for assessing PA pressure. Left Atrium: Left atrial size was normal in size. Right Atrium: Right atrial size was normal in size. Pericardium: There is no evidence of pericardial effusion. Mitral Valve: The mitral valve is normal in structure. Trivial mitral valve regurgitation. No evidence of mitral valve stenosis. Tricuspid Valve: The tricuspid valve is normal in structure. Tricuspid valve regurgitation is trivial. No evidence of tricuspid stenosis. Aortic Valve: Bicuspid aortic valve, with fusion of the RCC and LCC (image 20) with focal calcification. Mild AI. The aortic valve is bicuspid. . There is mild thickening and mild calcification of the aortic valve. Aortic valve regurgitation is mild. Mild aortic valve sclerosis is present, with no evidence of aortic valve stenosis. There is mild thickening of the aortic valve. There is mild calcification of the aortic valve. Pulmonic Valve: The pulmonic valve was grossly normal. Pulmonic valve regurgitation is trivial. Aorta: The aortic root was not well visualized, the ascending aorta was not well visualized and the aortic arch was not well visualized. Venous: The inferior vena cava is normal in size with greater than 50% respiratory variability, suggesting right atrial pressure of 3 mmHg. IAS/Shunts: The atrial septum is grossly normal.  LEFT VENTRICLE PLAX 2D LVIDd:         4.70 cm  Diastology LVIDs:         2.80 cm  LV e' lateral:   7.62 cm/s LV PW:         1.10 cm  LV E/e' lateral: 9.7 LV IVS:        1.20 cm  LV e' medial:    5.98 cm/s LVOT diam:     2.20 cm  LV E/e' medial:  12.4 LV SV:         87 LV SV Index:   46 LVOT Area:     3.80 cm  RIGHT VENTRICLE RV S prime:     9.46 cm/s TAPSE (M-mode): 1.7 cm LEFT ATRIUM  Index       RIGHT ATRIUM          Index LA diam:      3.00 cm 1.58 cm/m  RA Area:     8.89 cm LA Vol (A2C): 20.6 ml 10.87 ml/m RA Volume:   15.00 ml 7.91 ml/m LA Vol (A4C): 21.9 ml 11.55 ml/m   AORTIC VALVE LVOT Vmax:   95.30 cm/s LVOT Vmean:  71.000 cm/s LVOT VTI:    0.229 m  AORTA Ao Root diam: 3.60 cm MITRAL VALVE MV Area (PHT): 3.27 cm     SHUNTS MV Decel Time: 232 msec     Systemic VTI:  0.23 m MV E velocity: 74.10 cm/s   Systemic Diam: 2.20 cm MV A velocity: 102.00 cm/s MV E/A ratio:  0.73 Buford Dresser MD Electronically signed by Buford Dresser MD Signature Date/Time: 02/02/2020/4:39:02 PM    Final    ECHO TEE  Result Date: 02/04/2020    TRANSESOPHOGEAL ECHO REPORT   Patient Name:   Peter [name] Date of Exam: 02/04/2020 Medical Rec #:  [id number]        Height:       70.0 in Accession #:    [id number]       Weight:       159.4 lb Date of Birth:  January 01, 1961       BSA:          1.895 m Patient Age:    40 years         BP:           156/81 mmHg Patient Gender: M                HR:           64 bpm. Exam Location:  Inpatient Procedure: Transesophageal Echo and Color Doppler Indications:     Bacteremia  History:         Patient has prior history of Echocardiogram examinations, most                  recent 02/02/2020. Risk Factors:Diabetes, Hypertension and                  Dyslipidemia. CKD.  Sonographer:     Clayton Lefort RDCS (AE) Referring Phys:  [id number] Rafael Capo Diagnosing Phys: Buford Dresser MD PROCEDURE: After discussion of the risks and benefits of a TEE, an informed consent was obtained from the patient. The transesophogeal probe was passed without difficulty through the esophogus of the patient. Local oropharyngeal anesthetic was provided with viscous lidocaine. Sedation performed by different physician. The patient was monitored while under deep sedation. Anesthestetic sedation was provided intravenously by Anesthesiology: 139.14m of Propofol. Image quality was good. The patient's vital signs; including heart rate, blood pressure, and oxygen saturation; remained stable throughout the procedure. The patient developed no complications during the procedure.  IMPRESSIONS  1. Left ventricular ejection fraction, by estimation, is 65 to 70%. The left ventricle has normal function. The left ventricle has no regional wall motion abnormalities.  2. Right ventricular systolic function is normal. The right ventricular size is normal.  3. No left atrial/left atrial appendage thrombus was detected.  4. The mitral valve is normal in structure. Trivial mitral valve regurgitation. No evidence of mitral stenosis.  5. The aortic valve is bicuspid. Aortic valve regurgitation is trivial. No aortic stenosis is present.  6. There is mild (Grade II) plaque involving the descending aorta. Conclusion(s)/Recommendation(s): No evidence of  endocarditis. Bicuspid aortic valve. Structure seen on TTE is associated with the mitral valve, not the aortic, near the mitral-aortic annular junction. This appears to be a chordae, not consistent with vegetation. FINDINGS  Left Ventricle: Left ventricular ejection fraction, by estimation, is 65 to 70%. The left ventricle has normal function. The left ventricle has no regional wall motion abnormalities. The left ventricular internal cavity size was normal in size. There is  borderline left ventricular hypertrophy. Right Ventricle: The right ventricular size is normal. No increase in right ventricular wall thickness. Right ventricular systolic function is normal. Left Atrium: Left atrial size was not assessed. No left atrial/left atrial appendage thrombus was detected. Right Atrium: Right atrial size was not assessed. Pericardium: There is no evidence of pericardial effusion. Mitral Valve: The mitral valve is normal in structure. Trivial mitral valve regurgitation. No evidence of mitral valve stenosis. Tricuspid Valve: The tricuspid valve is normal in structure. Tricuspid valve regurgitation is trivial. No evidence of tricuspid stenosis. Aortic Valve: The aortic valve is bicuspid. Aortic valve regurgitation is trivial. No aortic stenosis is present. Pulmonic  Valve: The pulmonic valve was grossly normal. Pulmonic valve regurgitation is trivial. Aorta: The aortic root is normal in size and structure. There is mild (Grade II) plaque involving the descending aorta. IAS/Shunts: No atrial level shunt detected by color flow Doppler. Buford Dresser MD Electronically signed by Buford Dresser MD Signature Date/Time: 02/04/2020/5:29:42 PM    Final        Discharge Exam: Vitals:   02/14/20 0639 02/14/20 0640  BP: (!) 116/93 (!) 112/57  Pulse: (!) 108 97  Resp: 18 18  Temp: 97.9 F (36.6 C) 98.5 F (36.9 C)  SpO2: 100% 100%    General: Pt is alert, awake, not in acute distress Cardiovascular: RRR, S1/S2 +, no edema Respiratory: CTA bilaterally, no wheezing, no rhonchi, no respiratory distress, no conversational dyspnea, on room air  Abdominal: Soft, NT, ND, bowel sounds + Extremities: no edema, no cyanosis Psych: Normal mood and affect, stable judgement and insight     The results of significant diagnostics from this hospitalization (including imaging, microbiology, ancillary and laboratory) are listed below for reference.     Microbiology: No results found for this or any previous visit (from the past 240 hour(s)).   Labs: BNP (last 3 results) No results for input(s): BNP in the last 8760 hours. Basic Metabolic Panel: Recent Labs  Lab 02/10/20 0801 02/10/20 0801 02/11/20 0425 02/11/20 0425 02/11/20 1256 02/11/20 1755 02/12/20 0424 02/13/20 0442 02/14/20 0403  NA 135  --  135  --   --   --  134* 132* 134*  K 5.5*   < > 5.7*   < > 4.5 4.6 5.0 5.1 5.0  CL 102  --  101  --   --   --  101 100 103  CO2 22  --  23  --   --   --  22 21* 21*  GLUCOSE 176*  --  147*  --   --   --  128* 157* 163*  BUN 72*  --  79*  --   --   --  83* 90* 92*  CREATININE 4.38*  --  4.57*  --   --   --  4.61* 4.67* 4.56*  CALCIUM 8.2*  --  8.5*  --   --   --  8.8* 8.7* 8.9   < > = values in this interval not displayed.   Liver Function  Tests: No  results for input(s): AST, ALT, ALKPHOS, BILITOT, PROT, ALBUMIN in the last 168 hours. No results for input(s): LIPASE, AMYLASE in the last 168 hours. No results for input(s): AMMONIA in the last 168 hours. CBC: Recent Labs  Lab 02/09/20 0852 02/10/20 0801 02/11/20 0425 02/12/20 0424  WBC 9.4 6.8 6.9 6.1  HGB 7.8* 7.5* 7.5* 7.9*  HCT 25.5* 24.0* 23.9* 25.6*  MCV 94.1 93.8 94.5 93.1  PLT 322 PLATELET CLUMPS NOTED ON SMEAR, UNABLE TO ESTIMATE 254 260   Cardiac Enzymes: Recent Labs  Lab 02/10/20 0801 02/12/20 2250  CKTOTAL 100 95   BNP: Invalid input(s): POCBNP CBG: Recent Labs  Lab 02/13/20 0750 02/13/20 1214 02/13/20 1644 02/13/20 2035 02/14/20 0751  GLUCAP 137* 155* 210* 139* 173*   D-Dimer No results for input(s): DDIMER in the last 72 hours. Hgb A1c No results for input(s): HGBA1C in the last 72 hours. Lipid Profile Recent Labs    02/13/20 0442  CHOL 144  HDL 53  LDLCALC 74  TRIG 84  CHOLHDL 2.7   Thyroid function studies No results for input(s): TSH, T4TOTAL, T3FREE, THYROIDAB in the last 72 hours.  Invalid input(s): FREET3 Anemia work up No results for input(s): VITAMINB12, FOLATE, FERRITIN, TIBC, IRON, RETICCTPCT in the last 72 hours. Urinalysis    Component Value Date/Time   COLORURINE YELLOW 01/31/2020 2050   APPEARANCEUR CLOUDY (A) 01/31/2020 2050   APPEARANCEUR Turbid (A) 07/05/2017 1544   LABSPEC 1.008 01/31/2020 2050   PHURINE 5.0 01/31/2020 2050   GLUCOSEU >=500 (A) 01/31/2020 2050   HGBUR MODERATE (A) 01/31/2020 2050   [place] NEGATIVE 01/31/2020 2050   Theresa Negative 07/05/2017 [place] 01/31/2020 2050   PROTEINUR >=300 (A) 01/31/2020 2050   NITRITE NEGATIVE 01/31/2020 2050   LEUKOCYTESUR LARGE (A) 01/31/2020 2050   Sepsis Labs Invalid input(s): PROCALCITONIN,  WBC,  LACTICIDVEN Microbiology No results found for this or any previous visit (from the past 240 hour(s)).   Patient was seen  and examined on the day of discharge and was found to be in stable condition. Time coordinating discharge: 35 minutes including assessment and coordination of care, as well as examination of the patient.   SIGNED:  Dessa Phi, DO [facility] 02/14/2020, 10:19 AM

## 2020-02-12 NOTE — Progress Notes (Signed)
Pt being discharged. Dropped item on his right side. I offerred item back to pt and he didn't move right arm, respond to my offer. Pt pale, diaphoretic, pupils pinpoint, slumping in wheelchair. Code Stroke initiated. Pt back to 6N28.

## 2020-02-12 NOTE — Consult Note (Addendum)
NEURO HOSPITALIST  CONSULT   Requesting Physician: Dr. Maylene Roes    Chief Complaint: right side weakness  History obtained from:  Patient /  Chart  HPI:                                                                                                                                         Peter [name] is an 59 y.o. male who presented to [facility] on 8/15 for gluteal abscess s/p debridement and chills. 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 He was being prepared for discharge home today when he experienced sudden onset of RUE weakness. He also became pale and diaphoretic while sitting in wheelchair. BP was not obtained while he was still seated. After he was brought back to his bed and laid supine, BP was obtained which actually was hypertensive, the SBP being in the 150s. A Code Stroke was called. At the time of arrival of the Neurology team, the patient's RUE strength had returned to baseline.   tPA Given: No. Symptoms resolved      Modified Rankin: Rankin Score=0  He was sent down for STAT MRI brain with MRA of head and neck, all without contrast.   MRI: No acute finding MRA: Normal MRA head and neck    Past Medical History:  Diagnosis Date  . Diabetes mellitus without complication ([place])   . High cholesterol   . Multiple closed anterior-posterior compression fractures of pelvis ([place]) 02/08/2017    Past Surgical History:  Procedure Laterality Date  . ESOPHAGOGASTRODUODENOSCOPY N/A 03/01/2017   Procedure: ESOPHAGOGASTRODUODENOSCOPY (EGD);  Surgeon: Judeth Horn, MD;  Location: [place];  Service: General;  Laterality: N/A;  . EXTERNAL FIXATION PELVIS N/A 02/07/2017   Procedure: EXTERNAL FIXATION PELVIS;  Surgeon: Altamese [place], MD;  Location: [place];  Service: Orthopedics;  Laterality: N/A;  . HERNIA REPAIR    . IR ANGIOGRAM PELVIS SELECTIVE OR  SUPRASELECTIVE  02/07/2017  . IR ANGIOGRAM SELECTIVE EACH ADDITIONAL VESSEL  02/07/2017  . IR ANGIOGRAM SELECTIVE EACH ADDITIONAL VESSEL  02/07/2017  . IR ANGIOGRAM VISCERAL SELECTIVE  02/07/2017  . IR ANGIOGRAM VISCERAL SELECTIVE  02/07/2017  . IR CATHETER TUBE CHANGE  04/26/2017  . IR EMBO ART  VEN HEMORR LYMPH EXTRAV  INC GUIDE ROADMAPPING  02/07/2017  . IR EMBO ART  VEN HEMORR LYMPH EXTRAV  INC GUIDE ROADMAPPING  02/07/2017  . IR FLUORO GUIDE CV LINE RIGHT  02/05/2020  . IR FLUORO GUIDED NEEDLE PLC ASPIRATION/INJECTION LOC  03/22/2017  . IR US GUIDE VASC ACCESS RIGHT  02/07/2017  . IR US GUIDE VASC ACCESS RIGHT  02/05/2020  . IRRIGATION AND DEBRIDEMENT ABSCESS N/A 02/01/2020   Procedure: IRRIGATION AND DEBRIDEMENT ABSCESS;  Surgeon: Alphonsa Overall, MD;  Location: [place];  Service: General;  Laterality: N/A;  . ORIF PELVIC FRACTURE N/A 02/13/2017   Procedure: OPEN REDUCTION INTERNAL FIXATION (ORIF) PELVIC FRACTURE;  Surgeon: Altamese [place], MD;  Location: [place];  Service: Orthopedics;  Laterality: N/A;  . PEG PLACEMENT N/A 03/01/2017   Procedure: PERCUTANEOUS ENDOSCOPIC GASTROSTOMY (PEG) PLACEMENT;  Surgeon: Judeth Horn, MD;  Location: [place];  Service: General;  Laterality: N/A;  . PERCUTANEOUS TRACHEOSTOMY N/A 03/01/2017   Procedure: PERCUTANEOUS TRACHEOSTOMY AT BEDSIDE;  Surgeon: Judeth Horn, MD;  Location: [place];  Service: General;  Laterality: N/A;  . RADIOLOGY WITH ANESTHESIA N/A 02/07/2017   Procedure: RADIOLOGY WITH ANESTHESIA;  Surgeon: Greggory Keen, MD;  Location: [place];  Service: Radiology;  Laterality: N/A;  . SACROILIAC JOINT FUSION N/A 02/07/2017   Procedure: SACROILIAC JOINT FUSION;  Surgeon: Altamese [place], MD;  Location: [place];  Service: Orthopedics;  Laterality: N/A;  . TEE WITHOUT CARDIOVERSION N/A 02/04/2020   Procedure: TRANSESOPHAGEAL ECHOCARDIOGRAM (TEE);  Surgeon: Buford Dresser, MD;  Location: [facility] ENDOSCOPY;  Service: Cardiovascular;  Laterality: N/A;    Family  History  Problem Relation Age of Onset  . Heart disease Mother   . Diabetes Mother   . Renal Disease Mother    Social History:  reports that he has never smoked. He has never used smokeless tobacco. He reports that he does not drink alcohol and does not use drugs.  Allergies: No Known Allergies  Medications:                                                                                                                           Scheduled: . amLODipine  10 mg Oral Daily  . chlorthalidone  25 mg Oral Daily  . collagenase   Topical TID  . heparin injection (subcutaneous)  5,000 Units Subcutaneous Q8H  . hydrALAZINE  50 mg Oral Q8H  . insulin aspart  0-15 Units Subcutaneous TID AC & HS  . sodium zirconium cyclosilicate  10 g Oral Daily   Continuous: . DAPTOmycin (CUBICIN)  IV 600 mg (02/12/20 1146)   SEG:[id number] **OR** acetaminophen, ondansetron **OR** ondansetron (ZOFRAN) IV, oxyCODONE-acetaminophen **OR** oxyCODONE-acetaminophen, polyethylene glycol, silver nitrate applicators   ROS:  ROS was performed and is negative except as noted in HPI   General Examination:                                                                                                      Blood pressure (!) 152/76, pulse 92, temperature 97.9 F (36.6 C), temperature source Oral, resp. rate 16, height 5\' 10"  (1.778 m), weight 72.3 kg, SpO2 100 %.  Physical Exam  HEENT: [place]/AT Lungs: Respirations unlabored Ext: Warm and well perfused  Neurological Examination Ment: Appears mildly anxious. Speech fluent with intact naming and comprehension. No dysarthria. Fully oriented.  CN: PERRL. Visual fields intact. EOMI with mild exotropia noted. Temp sensation intact. Face symmetric. Phonation intact. Tongue midline. SS equal.  Motor: 5/5 x 4 proximally and distally. No pronator  drift.  Sensory: FT and temp sensation intact x 4. Right sided extinction to DSS noted.  Reflexes: Normoactive x 4.  Cerebellar: No ataxia with FNF bilaterally. There is a coarse action tremor bilaterally. Mild asterixis.  Gait: Deferred    Lab Results: Basic Metabolic Panel: Recent Labs  Lab 02/09/20 0201 02/09/20 0201 02/09/20 0852 02/09/20 1502 02/10/20 0801 02/11/20 0425 02/11/20 1256 02/11/20 1755 02/12/20 0424  NA 137  --  136  --  135 135  --   --  134*  K 5.9*   < > 6.3*   < > 5.5* 5.7* 4.5 4.6 5.0  CL 102  --  104  --  102 101  --   --  101  CO2 25  --  23  --  22 23  --   --  22  GLUCOSE 112*  --  157*  --  176* 147*  --   --  128*  BUN 69*  --  69*  --  72* 79*  --   --  83*  CREATININE 4.18*  --  4.30*  --  4.38* 4.57*  --   --  4.61*  CALCIUM 8.2*   < > 8.4*  --  8.2* 8.5*  --   --  8.8*   < > = values in this interval not displayed.    CBC: Recent Labs  Lab 02/06/20 0354 02/06/20 0354 02/07/20 0337 02/09/20 0852 02/10/20 0801 02/11/20 0425 02/12/20 0424  WBC 11.0*   < > 10.1 9.4 6.8 6.9 6.1  NEUTROABS 7.5  --  6.5  --   --   --   --   HGB 7.6*   < > 7.6* 7.8* 7.5* 7.5* 7.9*  HCT 24.0*   < > 24.4* 25.5* 24.0* 23.9* 25.6*  MCV 93.4   < > 93.8 94.1 93.8 94.5 93.1  PLT 360   < > 351 322 PLATELET CLUMPS NOTED ON SMEAR, UNABLE TO ESTIMATE 254 260   < > = values in this interval not displayed.    CBG: Recent Labs  Lab 02/11/20 1217 02/11/20 1659 02/11/20 2111 02/12/20 0730 02/12/20 1140  GLUCAP 125* 177* 223* 122* 153*    Imaging: No results found.     Laurey Morale, MSN, NP-C  [facility] [phone] 02/12/2020, 1:40 PM  MRI brain with MRA of head and neck: 1. No acute finding by MRI. Mild chronic small-vessel change of the pons and cerebral hemispheric white matter. 2. Normal intracranial MR angiography of the large and medium size vessels. 3. Normal MR angiography of the neck vessels.  Assessment: 59 y.o. male with a  PMHx of DM, HLD and CKD, who presented to [facility] for gluteal abscess s/p debridement and chills. He was being prepared for discharge home today when he experienced sudden onset of RUE weakness. He also became pale and diaphoretic while sitting in wheelchair. BP was not obtained while he was still seated. After he was brought back to his bed and laid supine, BP was obtained which actually was hypertensive, the SBP being in the 150s. A Code Stroke was called. At the time of arrival of the Neurology team, the patient's RUE strength had returned to baseline.  1. Exam nonfocal except for right sided extinction to DSS. Coarse action tremor and mild asterixis also noted.  2. MRI was negative for stroke and the MRA head and neck did not show an LVO. 3. Stroke Risk Factors - diabetes mellitus and hyperlipidemia 4. TTE 8/15: Conclusion(s)/Recommendation(s): Bicuspid aortic valve. There is an  independently mobile linear echodensity seen in the LVOT. Given  bacteremia, would recommend TEE to further evaluate for endocarditis.  5. TEE 8/17: Conclusion(s)/Recommendation(s): No evidence of endocarditis. Bicuspid aortic valve. Structure seen on TTE is associated with the mitral valve,  not the aortic, near the mitral-aortic annular junction. This appears to  be a chordae, not consistent with vegetation.    Recommendations: -- BP goal: Permissive HTN x 24 hours -- HgbA1c, fasting lipid panel -- PT consult, OT consult, Speech consult -- Telemetry monitoring -- Frequent neuro checks -- Stroke swallow screen  -- Orthostatics -- Start ASA 81 mg po qd -- Start atorvastatin 40 mg po qd. CK level is normal at 100.   Electronically signed: Dr. Kerney Elbe

## 2020-02-12 NOTE — Progress Notes (Signed)
PROGRESS NOTE    Peter [name]  WEX:[id number] DOB: 17-Apr-1961 DOA: 01/31/2020 PCP: Raelene Bott, MD     Brief Narrative:  Peter Shewell Broweris a 59 y.o. malewith medical history significant for uncontrolled type 2 diabetes, diabetic nephropathy, HTN, HLD, CKD stage IV who presented on 01/31/20 with chief complaint of subjective chills, generalized weakness, nausea and vomiting.  He was found to have multiple draining ulcers of the buttocks for the past week and was found to have leukocytosis, AKI with creatinine of 6, and CT imaging consistent with gluteal fluid collections concerning for abscesses that required debridement by Dr. Lucia Gaskins with surgery on 8/14.  Patient was found to have MRSA bacteremia as well as staph aureus growing from cultures from the abscess.  Empiric vancomycin and meropenem were changed to daptomycin due to acute on chronic kidney disease and culture data with ID consultants assistance.  Patient underwent TTE and TEE which were negative for any signs of endocarditis.  New events last 24 hours / Subjective: Patient was set to be discharged home this morning.  He had no complaints and his lab work had improved.  As patient was being wheeled out of the unit, he became less responsive to verbal commands, diaphoretic and right arm went completely limp.  Code stroke was activated.  Once patient returned back to the room for evaluation, he returned to his baseline with normal blood sugar and normal vital signs without any focal deficits.  Patient underwent MRI which was negative.  I reevaluated patient this afternoon with wife at bedside.  Patient states that this sometimes happens from time to time, especially when his pain is great.  He states that he was sat on the wheelchair for quite a long time and was not used to sitting in such an upright position.  He reports that he had a lot of pain sitting and waiting to be wheeled out of the unit.  He does not seem surprised or bothered  by this afternoon's events.  Assessment & Plan:   Principal Problem:   Cellulitis and abscess of buttock Active Problems:   Uncontrolled type 2 diabetes mellitus with diabetic nephropathy, without long-term current use of insulin (HCC)   Hyperkalemia   Essential hypertension   Mixed diabetic hyperlipidemia associated with type 2 diabetes mellitus ([place])   Acute renal failure superimposed on stage 4 chronic kidney disease (HCC)   Metabolic acidosis   Sepsis with acute renal failure without septic shock (HCC)   Deep tissue infection of buttocks with multiple abscesses complicated by MRSA bacteremia -Status post I&D on 8/14 by Dr. Lucia Gaskins -Completed daily PT hydrotherapy, last day 8/24.  Continue twice daily dressing changes at home.  Outpatient referral to wound clinic placed -Continue daptomycin x4 weeks per ID, outpatient follow-up arranged (weekly ck and BMP)  AKI on CKD stage IV and persistent moderate hyperkalemia -Creatinine currently stable at his baseline (4.44--per CareEverywhere). -Outpatient nephrology referral placed -Continue lokelma 10 mg daily until close follow up with nephrology as outpatient  Acute encephalopathy, metabolic -MRI brain, MRA head and neck negative.  Neurology consulted for code stroke initially  -Could be secondary to pain response, per report from the patient -Resolved and back to his baseline  Normocytic anemia -Stable  Type 2 diabetes, well controlled -A1c 7.4 -SSI Novolog   Essential hypertension -Continue amlodipine, hydralazine, chlorthalidone   DVT prophylaxis:  heparin injection 5,000 Units Start: 02/06/20 0600 SCDs Start: 02/01/20 0654  Code Status: Full code Family Communication: Wife at bedside  Disposition Plan:   Status is: Inpatient  Remains inpatient appropriate because:Inpatient level of care appropriate due to severity of illness   Dispo: The patient is from: Home              Anticipated d/c is to: Home               Anticipated d/c date is: 1 day              Patient currently is not medically stable to d/c.  Monitor overnight.  If no further events, discharged home with home health 8/26.   Consultants:   General surgery  Infectious disease  IR  Neurology   Antimicrobials:  Anti-infectives (From admission, onward)   Start     Dose/Rate Route Frequency Ordered Stop   02/12/20 1200  DAPTOmycin (CUBICIN) 600 mg in sodium chloride 0.9 % IVPB        600 mg 224 mL/hr over 30 Minutes Intravenous Every 48 hours 02/12/20 1046     02/12/20 0000  daptomycin (CUBICIN) IVPB        600 mg Intravenous Every 48 hours 02/12/20 0925 03/08/20 2359   02/04/20 2000  DAPTOmycin (CUBICIN) 600 mg in sodium chloride 0.9 % IVPB  Status:  Discontinued        600 mg 224 mL/hr over 30 Minutes Intravenous Every 48 hours 02/03/20 1426 02/12/20 1046   02/02/20 1430  DAPTOmycin (CUBICIN) 500 mg in sodium chloride 0.9 % IVPB  Status:  Discontinued        500 mg 220 mL/hr over 30 Minutes Intravenous Every 48 hours 02/02/20 1332 02/03/20 1426   02/01/20 0800  meropenem (MERREM) 500 mg in sodium chloride 0.9 % 100 mL IVPB  Status:  Discontinued        500 mg 200 mL/hr over 30 Minutes Intravenous Every 12 hours 02/01/20 0656 02/02/20 1304   02/01/20 0655  vancomycin variable dose per unstable renal function (pharmacist dosing)  Status:  Discontinued         Does not apply See admin instructions 02/01/20 0656 02/02/20 1305   02/01/20 0300  ceFEPIme (MAXIPIME) 2 g in sodium chloride 0.9 % 100 mL IVPB        2 g 200 mL/hr over 30 Minutes Intravenous  Once 02/01/20 0252 02/01/20 0446   02/01/20 0300  vancomycin (VANCOREADY) IVPB 1500 mg/300 mL        1,500 mg 150 mL/hr over 120 Minutes Intravenous  Once 02/01/20 0252 02/01/20 0624        Objective: Vitals:   02/12/20 0607 02/12/20 1332 02/12/20 1338 02/12/20 1503  BP: 111/66 (!) 151/71 (!) 152/76 (!) 155/75  Pulse: 71 (!) 111 92 96  Resp: 18 16 16 16   Temp: 98.7 F  (37.1 C) 97.9 F (36.6 C) 97.9 F (36.6 C) (!) 97.5 F (36.4 C)  TempSrc: Oral  Oral Oral  SpO2: 98% 100% 100% 99%  Weight:      Height:        Intake/Output Summary (Last 24 hours) at 02/12/2020 1639 Last data filed at 02/12/2020 0944 Gross per 24 hour  Intake --  Output 300 ml  Net -300 ml   Filed Weights   02/01/20 0313 02/01/20 0901 02/04/20 0704  Weight: 75.3 kg 72.3 kg 72.3 kg    Examination:  General exam: Appears calm and comfortable  Respiratory system: Clear to auscultation. Respiratory effort normal. No respiratory distress. No conversational dyspnea.  On room air Cardiovascular system: S1 &  S2 heard, RRR. No murmurs. No pedal edema. Gastrointestinal system: Abdomen is nondistended, soft and nontender. Normal bowel sounds heard. Central nervous system: Alert and oriented. No focal neurological deficits. Speech clear.  Extremities: Symmetric in appearance  Skin: No rashes, lesions or ulcers on exposed skin  Psychiatry: Judgement and insight appear normal. Mood & affect appropriate.   Data Reviewed: I have personally reviewed following labs and imaging studies  CBC: Recent Labs  Lab 02/06/20 0354 02/06/20 0354 02/07/20 0337 02/09/20 0852 02/10/20 0801 02/11/20 0425 02/12/20 0424  WBC 11.0*   < > 10.1 9.4 6.8 6.9 6.1  NEUTROABS 7.5  --  6.5  --   --   --   --   HGB 7.6*   < > 7.6* 7.8* 7.5* 7.5* 7.9*  HCT 24.0*   < > 24.4* 25.5* 24.0* 23.9* 25.6*  MCV 93.4   < > 93.8 94.1 93.8 94.5 93.1  PLT 360   < > 351 322 PLATELET CLUMPS NOTED ON SMEAR, UNABLE TO ESTIMATE 254 260   < > = values in this interval not displayed.   Basic Metabolic Panel: Recent Labs  Lab 02/09/20 0201 02/09/20 0201 02/09/20 0852 02/09/20 1502 02/10/20 0801 02/11/20 0425 02/11/20 1256 02/11/20 1755 02/12/20 0424  NA 137  --  136  --  135 135  --   --  134*  K 5.9*   < > 6.3*   < > 5.5* 5.7* 4.5 4.6 5.0  CL 102  --  104  --  102 101  --   --  101  CO2 25  --  23  --  22 23  --    --  22  GLUCOSE 112*  --  157*  --  176* 147*  --   --  128*  BUN 69*  --  69*  --  72* 79*  --   --  83*  CREATININE 4.18*  --  4.30*  --  4.38* 4.57*  --   --  4.61*  CALCIUM 8.2*  --  8.4*  --  8.2* 8.5*  --   --  8.8*   < > = values in this interval not displayed.   GFR: Estimated Creatinine Clearance: 17.6 mL/min (A) (by C-G formula based on SCr of 4.61 mg/dL (H)). Liver Function Tests: No results for input(s): AST, ALT, ALKPHOS, BILITOT, PROT, ALBUMIN in the last 168 hours. No results for input(s): LIPASE, AMYLASE in the last 168 hours. No results for input(s): AMMONIA in the last 168 hours. Coagulation Profile: No results for input(s): INR, PROTIME in the last 168 hours. Cardiac Enzymes: Recent Labs  Lab 02/10/20 0801  CKTOTAL 100   BNP (last 3 results) No results for input(s): PROBNP in the last 8760 hours. HbA1C: No results for input(s): HGBA1C in the last 72 hours. CBG: Recent Labs  Lab 02/11/20 1659 02/11/20 2111 02/12/20 0730 02/12/20 1140 02/12/20 1339  GLUCAP 177* 223* 122* 153* 151*   Lipid Profile: No results for input(s): CHOL, HDL, LDLCALC, TRIG, CHOLHDL, LDLDIRECT in the last 72 hours. Thyroid Function Tests: No results for input(s): TSH, T4TOTAL, FREET4, T3FREE, THYROIDAB in the last 72 hours. Anemia Panel: Recent Labs    02/10/20 1534  FERRITIN 473*  TIBC 251  IRON 65   Sepsis Labs: No results for input(s): PROCALCITON, LATICACIDVEN in the last 168 hours.  Recent Results (from the past 240 hour(s))  Culture, blood (routine x 2)     Status: None   Collection  Time: 02/03/20  4:02 AM   Specimen: BLOOD  Result Value Ref Range Status   Specimen Description BLOOD SITE NOT SPECIFIED  Final   Special Requests   Final    BOTTLES DRAWN AEROBIC ONLY Blood Culture results may not be optimal due to an inadequate volume of blood received in culture bottles   Culture   Final    NO GROWTH 5 DAYS Performed at [facility], [address],  [place], [place] [address]    Report Status 02/08/2020 FINAL  Final  Culture, blood (routine x 2)     Status: None   Collection Time: 02/03/20  4:13 AM   Specimen: BLOOD  Result Value Ref Range Status   Specimen Description BLOOD SITE NOT SPECIFIED  Final   Special Requests   Final    BOTTLES DRAWN AEROBIC ONLY Blood Culture results may not be optimal due to an inadequate volume of blood received in culture bottles   Culture   Final    NO GROWTH 5 DAYS Performed at [facility], [place] [address], [place],  [address]    Report Status 02/08/2020 FINAL  Final      Radiology Studies: MR ANGIO HEAD WO CONTRAST  Result Date: 02/12/2020 CLINICAL DATA:  Sudden onset of right-sided weakness. EXAM: MRI HEAD WITHOUT CONTRAST MRA HEAD WITHOUT CONTRAST MRA NECK WITHOUT CONTRAST TECHNIQUE: Multiplanar, multiecho pulse sequences of the brain and surrounding structures were obtained without intravenous contrast. Angiographic images of the Circle of Willis were obtained using MRA technique without intravenous contrast. Angiographic images of the neck were obtained using MRA technique without intravenous contrast. Carotid stenosis measurements (when applicable) are obtained utilizing NASCET criteria, using the distal internal carotid diameter as the denominator. COMPARISON:  CT angiography 02/11/2017.  MRI 02/14/2017. FINDINGS: MRI HEAD FINDINGS Brain: Diffusion imaging does not show any acute or subacute infarction. No focal abnormality affects the cerebellum. Mild chronic small-vessel change of the pons. Cerebral hemispheres show mild small vessel change of the hemispheric white matter. No cortical or large vessel territory infarction. No mass lesion, hemorrhage, hydrocephalus or extra-axial collection. Vascular: Major vessels at the base of the brain show flow. Skull and upper cervical spine: Negative Sinuses/Orbits: Clear/normal Other: None MRA HEAD FINDINGS Both internal carotid arteries are widely  patent into the brain. No siphon stenosis. The anterior and middle cerebral vessels are patent without proximal stenosis, aneurysm or vascular malformation. Fetal origin right PCA. Both vertebral arteries are widely patent to the basilar. No basilar stenosis. Posterior circulation branch vessels appear normal. MRA NECK FINDINGS Both common carotid arteries widely patent to the bifurcation. Both carotid bifurcations appear normal without stenosis or irregularity. Cervical internal carotid arteries appear widely patent and normal. Both vertebral artery origins appear widely patent. Both vertebral arteries appear normal through the cervical region to the foramen magnum. IMPRESSION: 1. No acute finding by MRI. Mild chronic small-vessel change of the pons and cerebral hemispheric white matter. 2. Normal intracranial MR angiography of the large and medium size vessels. 3. Normal MR angiography of the neck vessels. Electronically Signed   By: Nelson Chimes M.D.   On: 02/12/2020 15:14   MR ANGIO NECK WO CONTRAST  Result Date: 02/12/2020 CLINICAL DATA:  Sudden onset of right-sided weakness. EXAM: MRI HEAD WITHOUT CONTRAST MRA HEAD WITHOUT CONTRAST MRA NECK WITHOUT CONTRAST TECHNIQUE: Multiplanar, multiecho pulse sequences of the brain and surrounding structures were obtained without intravenous contrast. Angiographic images of the Circle of Willis were obtained using MRA  technique without intravenous contrast. Angiographic images of the neck were obtained using MRA technique without intravenous contrast. Carotid stenosis measurements (when applicable) are obtained utilizing NASCET criteria, using the distal internal carotid diameter as the denominator. COMPARISON:  CT angiography 02/11/2017.  MRI 02/14/2017. FINDINGS: MRI HEAD FINDINGS Brain: Diffusion imaging does not show any acute or subacute infarction. No focal abnormality affects the cerebellum. Mild chronic small-vessel change of the pons. Cerebral hemispheres show  mild small vessel change of the hemispheric white matter. No cortical or large vessel territory infarction. No mass lesion, hemorrhage, hydrocephalus or extra-axial collection. Vascular: Major vessels at the base of the brain show flow. Skull and upper cervical spine: Negative Sinuses/Orbits: Clear/normal Other: None MRA HEAD FINDINGS Both internal carotid arteries are widely patent into the brain. No siphon stenosis. The anterior and middle cerebral vessels are patent without proximal stenosis, aneurysm or vascular malformation. Fetal origin right PCA. Both vertebral arteries are widely patent to the basilar. No basilar stenosis. Posterior circulation branch vessels appear normal. MRA NECK FINDINGS Both common carotid arteries widely patent to the bifurcation. Both carotid bifurcations appear normal without stenosis or irregularity. Cervical internal carotid arteries appear widely patent and normal. Both vertebral artery origins appear widely patent. Both vertebral arteries appear normal through the cervical region to the foramen magnum. IMPRESSION: 1. No acute finding by MRI. Mild chronic small-vessel change of the pons and cerebral hemispheric white matter. 2. Normal intracranial MR angiography of the large and medium size vessels. 3. Normal MR angiography of the neck vessels. Electronically Signed   By: Nelson Chimes M.D.   On: 02/12/2020 15:14   MR BRAIN WO CONTRAST  Result Date: 02/12/2020 CLINICAL DATA:  Sudden onset of right-sided weakness. EXAM: MRI HEAD WITHOUT CONTRAST MRA HEAD WITHOUT CONTRAST MRA NECK WITHOUT CONTRAST TECHNIQUE: Multiplanar, multiecho pulse sequences of the brain and surrounding structures were obtained without intravenous contrast. Angiographic images of the Circle of Willis were obtained using MRA technique without intravenous contrast. Angiographic images of the neck were obtained using MRA technique without intravenous contrast. Carotid stenosis measurements (when applicable) are  obtained utilizing NASCET criteria, using the distal internal carotid diameter as the denominator. COMPARISON:  CT angiography 02/11/2017.  MRI 02/14/2017. FINDINGS: MRI HEAD FINDINGS Brain: Diffusion imaging does not show any acute or subacute infarction. No focal abnormality affects the cerebellum. Mild chronic small-vessel change of the pons. Cerebral hemispheres show mild small vessel change of the hemispheric white matter. No cortical or large vessel territory infarction. No mass lesion, hemorrhage, hydrocephalus or extra-axial collection. Vascular: Major vessels at the base of the brain show flow. Skull and upper cervical spine: Negative Sinuses/Orbits: Clear/normal Other: None MRA HEAD FINDINGS Both internal carotid arteries are widely patent into the brain. No siphon stenosis. The anterior and middle cerebral vessels are patent without proximal stenosis, aneurysm or vascular malformation. Fetal origin right PCA. Both vertebral arteries are widely patent to the basilar. No basilar stenosis. Posterior circulation branch vessels appear normal. MRA NECK FINDINGS Both common carotid arteries widely patent to the bifurcation. Both carotid bifurcations appear normal without stenosis or irregularity. Cervical internal carotid arteries appear widely patent and normal. Both vertebral artery origins appear widely patent. Both vertebral arteries appear normal through the cervical region to the foramen magnum. IMPRESSION: 1. No acute finding by MRI. Mild chronic small-vessel change of the pons and cerebral hemispheric white matter. 2. Normal intracranial MR angiography of the large and medium size vessels. 3. Normal MR angiography of the neck vessels. Electronically Signed  By: Nelson Chimes M.D.   On: 02/12/2020 15:14      Scheduled Meds: . amLODipine  10 mg Oral Daily  . Chlorhexidine Gluconate Cloth  6 each Topical Daily  . chlorthalidone  25 mg Oral Daily  . collagenase   Topical TID  . heparin injection  (subcutaneous)  5,000 Units Subcutaneous Q8H  . hydrALAZINE  50 mg Oral Q8H  . insulin aspart  0-15 Units Subcutaneous TID AC & HS  . sodium zirconium cyclosilicate  10 g Oral Daily   Continuous Infusions: . DAPTOmycin (CUBICIN)  IV 600 mg (02/12/20 1146)     LOS: 11 days      Time spent: 45 minutes   Dessa Phi, DO [facility] 02/12/2020, 4:39 PM   Available via Epic secure chat 7am-7pm After these hours, please refer to coverage provider listed on [email or web address]

## 2020-02-12 NOTE — Progress Notes (Signed)
Wound dressing changed as ordered this shift, no new issue noted during the dressing, patient tolerated it well, denied pain after, will continue to monitor.

## 2020-02-12 NOTE — Progress Notes (Signed)
Patient back to 6N28 from MRI. VS stable. Alert, oriented x4. No complaints at this time. Will continue to monitor. Wife at bedside.

## 2020-02-12 NOTE — Progress Notes (Signed)
Patient being discharged, during transport via wheelchair from room to front lobby of 6N patient dropped yellow foam. RN offering foam to patient and patient's right arm completely limp. Patient was not responding to verbal commands, diaphoretic. Patient wheeled back to room and moved to bed. Code stroke initiated. Neurology at bedside and MD notified.

## 2020-02-12 NOTE — Code Documentation (Signed)
Inpatient code stroke from [phone]. Pt was being discharged in a wheelchair and RN was handing patient a foam that he dropped and noticed he suddenly couldn't lift or move right arm. He was also clammy, had pinpoint pupils and was not responding to her. They took him back to his room and placed him back in bed. He went back to his baseline and his NIHSS is 0. Code Stroke called and team arrived. Pt still NIHSS 0. He was taken for MRI/MRA (could not complete CTA r/t kidney function).   Results: "1. No acute finding by MRI. Mild chronic small-vessel change of the pons and cerebral hemispheric white matter. 2. Normal intracranial MR angiography of the large and medium size vessels. 3. Normal MR angiography of the neck vessels."   Care Plan: No acute stroke found, bedside RN and attending physician to decide on further care needed. Hersey Maclellan, Rande Brunt, RN

## 2020-02-13 LAB — GLUCOSE, CAPILLARY
Glucose-Capillary: 137 mg/dL — ABNORMAL HIGH (ref 70–99)
Glucose-Capillary: 155 mg/dL — ABNORMAL HIGH (ref 70–99)
Glucose-Capillary: 210 mg/dL — ABNORMAL HIGH (ref 70–99)
Glucose-Capillary: 139 mg/dL — ABNORMAL HIGH (ref 70–99)

## 2020-02-13 LAB — LIPID PANEL
Cholesterol: 144 mg/dL (ref 0–200)
HDL: 53 mg/dL (ref >40)
LDL Cholesterol: 74 mg/dL (ref 0–99)
Total CHOL/HDL Ratio: 2.7 RATIO
Triglycerides: 84 mg/dL (ref <150)
VLDL: 17 mg/dL (ref 0–40)

## 2020-02-13 LAB — BASIC METABOLIC PANEL
Anion gap: 11 (ref 5–15)
BUN: 90 mg/dL — ABNORMAL HIGH (ref 6–20)
CO2: 21 mmol/L — ABNORMAL LOW (ref 22–32)
Calcium: 8.7 mg/dL — ABNORMAL LOW (ref 8.9–10.3)
Chloride: 100 mmol/L (ref 98–111)
Creatinine, Ser: 4.67 mg/dL — ABNORMAL HIGH (ref 0.61–1.24)
GFR calc Af Amer: 15 mL/min — ABNORMAL LOW (ref >60)
GFR calc non Af Amer: 13 mL/min — ABNORMAL LOW (ref >60)
Glucose, Bld: 157 mg/dL — ABNORMAL HIGH (ref 70–99)
Potassium: 5.1 mmol/L (ref 3.5–5.1)
Sodium: 132 mmol/L — ABNORMAL LOW (ref 135–145)

## 2020-02-13 MED ORDER — ASPIRIN EC 81 MG PO TBEC
81.0000 mg | DELAYED_RELEASE_TABLET | Freq: Every day | ORAL | Status: DC
  Administered 2020-02-13 – 2020-02-14 (×2): 81 mg via ORAL
  Filled 2020-02-13 (×2): qty 1

## 2020-02-13 MED ORDER — SIMVASTATIN 20 MG PO TABS: 20.0000 mg | ORAL_TABLET | Freq: Every day | ORAL | Status: DC

## 2020-02-13 MED ORDER — HYDRALAZINE HCL 25 MG PO TABS
25.0000 mg | ORAL_TABLET | Freq: Three times a day (TID) | ORAL | Status: DC
  Administered 2020-02-13 – 2020-02-14 (×3): 25 mg via ORAL
  Filled 2020-02-13 (×3): qty 1

## 2020-02-13 MED ORDER — SIMVASTATIN 20 MG PO TABS
20.0000 mg | ORAL_TABLET | Freq: Every day | ORAL | Status: DC
  Administered 2020-02-13: 20 mg via ORAL
  Filled 2020-02-13: qty 1

## 2020-02-13 MED ORDER — AMLODIPINE BESYLATE 5 MG PO TABS
5.0000 mg | ORAL_TABLET | Freq: Every day | ORAL | Status: DC
  Administered 2020-02-13 – 2020-02-14 (×2): 5 mg via ORAL
  Filled 2020-02-13 (×2): qty 1

## 2020-02-13 NOTE — Progress Notes (Signed)
Pharmacy Antibiotic Note  Peter [name] is a 59 y.o. male admitted on 01/31/2020 with cellulitis and abscess of buttock. Patient was initially started on vancomycin and meropenem, and went to OR for I&D and debridement of wound on 8/14. Patient subsequently found to have MRSA bacteremia, and wound cultures also growing Staph aureus (peliminary result). Now pharmacy has been consulted to dose and monitor daptomycin due to unstable renal function.  Patient with AKI. Unclear what current baseline is as last SCr before admission was in 2018. SCr has trended up to 4.67 with CrCl ~ 17 ml/min.   CK WNL at 95; off Zocor due to increased risk of myopathy and rhabdomyolysis.   Plan: Continue Daptomycin 600 mg every 48 hours (~8 mg/kg)  CK qMon Monitor renal fxn, mico data, clinical progress  Height: 5\' 10"  (177.8 cm) Weight: 72.3 kg (159 lb 6.3 oz) IBW/kg (Calculated) : 73  Temp (24hrs), Avg:97.9 F (36.6 C), Min:97.5 F (36.4 C), Max:98.1 F (36.7 C)  Recent Labs  Lab 02/07/20 0337 02/07/20 1508 02/09/20 0852 02/10/20 0801 02/11/20 0425 02/12/20 0424 02/13/20 0442  WBC 10.1  --  9.4 6.8 6.9 6.1  --   CREATININE 3.99*   < > 4.30* 4.38* 4.57* 4.61* 4.67*   < > = values in this interval not displayed.    Estimated Creatinine Clearance: 17.4 mL/min (A) (by C-G formula based on SCr of 4.67 mg/dL (H)).    No Known Allergies  Vanc x1 8/14 Merrem 8/14 >> 8/15 Cefepime x1 8/14 Daptomycin 8/15 >>  8/15 VL = 19 mcg/mL 8/16 CK - 161  8/14 buttocks abscess - Staph aureus - MRSA 8/14 buttocks - Staph aureus MRSA 8/14 BCx - MRSA 8/15 MRSA PCR - negative 8/16 BCx - ngf    Thank you for allowing [place] to participate in this patients care.   Jens Som, PharmD Please see amion for complete clinical pharmacist phone list. 02/13/2020 1:09 PM

## 2020-02-13 NOTE — Progress Notes (Signed)
STROKE TEAM PROGRESS NOTE   INTERVAL HISTORY No family at bedside. Pt awake alert interactive. Stated that he was sitting in wheelchair yesterday before discharge, which made him right leg and buttock really painful, extremely painful, and then he felt hot, sweaty and almost passed out. And then he remembered that he was put in bed, and suctioning made him want to throw up. He stated that 3 years ago, he had one episode, when he stood up too long and his BP went down and he was passed out. He felt yesterday the pain made him almost passed out. He said his right UE always weaker due to previous accident which made him not able to pronation and right fingers are weak. Now he is back to baseline. Orthostatic vital today positive for orthostatic hypotension.   Vitals:   02/12/20 1900 02/12/20 2051 02/13/20 0355 02/13/20 0753  BP: 124/72 131/77 (!) 157/83   Pulse: 89 88 90   Resp: 16 18 19 18   Temp: 98 F (36.7 C)  98.1 F (36.7 C) 97.9 F (36.6 C)  TempSrc: Oral  Oral Oral  SpO2: 99% 98% 98%   Weight:      Height:       CBC:  Recent Labs  Lab 02/07/20 0337 02/09/20 0852 02/11/20 0425 02/12/20 0424  WBC 10.1   < > 6.9 6.1  NEUTROABS 6.5  --   --   --   HGB 7.6*   < > 7.5* 7.9*  HCT 24.4*   < > 23.9* 25.6*  MCV 93.8   < > 94.5 93.1  PLT 351   < > 254 260   < > = values in this interval not displayed.   Basic Metabolic Panel:  Recent Labs  Lab 02/12/20 0424 02/13/20 0442  NA 134* 132*  K 5.0 5.1  CL 101 100  CO2 22 21*  GLUCOSE 128* 157*  BUN 83* 90*  CREATININE 4.61* 4.67*  CALCIUM 8.8* 8.7*   Lipid Panel:  Recent Labs  Lab 02/13/20 0442  CHOL 144  TRIG 84  HDL 53  CHOLHDL 2.7  VLDL 17  LDLCALC 74   Lab Results  Component Value Date   HGBA1C 7.4 (H) 02/01/2020   Urine Drug Screen: No results for input(s): LABOPIA, COCAINSCRNUR, LABBENZ, AMPHETMU, THCU, LABBARB in the last 168 hours.  Alcohol Level No results for input(s): ETH in the last 168 hours.  IMAGING  past 24 hours MR ANGIO HEAD WO CONTRAST  Result Date: 02/12/2020 CLINICAL DATA:  Sudden onset of right-sided weakness. EXAM: MRI HEAD WITHOUT CONTRAST MRA HEAD WITHOUT CONTRAST MRA NECK WITHOUT CONTRAST TECHNIQUE: Multiplanar, multiecho pulse sequences of the brain and surrounding structures were obtained without intravenous contrast. Angiographic images of the Circle of Willis were obtained using MRA technique without intravenous contrast. Angiographic images of the neck were obtained using MRA technique without intravenous contrast. Carotid stenosis measurements (when applicable) are obtained utilizing NASCET criteria, using the distal internal carotid diameter as the denominator. COMPARISON:  CT angiography 02/11/2017.  MRI 02/14/2017. FINDINGS: MRI HEAD FINDINGS Brain: Diffusion imaging does not show any acute or subacute infarction. No focal abnormality affects the cerebellum. Mild chronic small-vessel change of the pons. Cerebral hemispheres show mild small vessel change of the hemispheric white matter. No cortical or large vessel territory infarction. No mass lesion, hemorrhage, hydrocephalus or extra-axial collection. Vascular: Major vessels at the base of the brain show flow. Skull and upper cervical spine: Negative Sinuses/Orbits: Clear/normal Other: None MRA HEAD FINDINGS  Both internal carotid arteries are widely patent into the brain. No siphon stenosis. The anterior and middle cerebral vessels are patent without proximal stenosis, aneurysm or vascular malformation. Fetal origin right PCA. Both vertebral arteries are widely patent to the basilar. No basilar stenosis. Posterior circulation branch vessels appear normal. MRA NECK FINDINGS Both common carotid arteries widely patent to the bifurcation. Both carotid bifurcations appear normal without stenosis or irregularity. Cervical internal carotid arteries appear widely patent and normal. Both vertebral artery origins appear widely patent. Both  vertebral arteries appear normal through the cervical region to the foramen magnum. IMPRESSION: 1. No acute finding by MRI. Mild chronic small-vessel change of the pons and cerebral hemispheric white matter. 2. Normal intracranial MR angiography of the large and medium size vessels. 3. Normal MR angiography of the neck vessels. Electronically Signed   By: Nelson Chimes M.D.   On: 02/12/2020 15:14   MR ANGIO NECK WO CONTRAST  Result Date: 02/12/2020 CLINICAL DATA:  Sudden onset of right-sided weakness. EXAM: MRI HEAD WITHOUT CONTRAST MRA HEAD WITHOUT CONTRAST MRA NECK WITHOUT CONTRAST TECHNIQUE: Multiplanar, multiecho pulse sequences of the brain and surrounding structures were obtained without intravenous contrast. Angiographic images of the Circle of Willis were obtained using MRA technique without intravenous contrast. Angiographic images of the neck were obtained using MRA technique without intravenous contrast. Carotid stenosis measurements (when applicable) are obtained utilizing NASCET criteria, using the distal internal carotid diameter as the denominator. COMPARISON:  CT angiography 02/11/2017.  MRI 02/14/2017. FINDINGS: MRI HEAD FINDINGS Brain: Diffusion imaging does not show any acute or subacute infarction. No focal abnormality affects the cerebellum. Mild chronic small-vessel change of the pons. Cerebral hemispheres show mild small vessel change of the hemispheric white matter. No cortical or large vessel territory infarction. No mass lesion, hemorrhage, hydrocephalus or extra-axial collection. Vascular: Major vessels at the base of the brain show flow. Skull and upper cervical spine: Negative Sinuses/Orbits: Clear/normal Other: None MRA HEAD FINDINGS Both internal carotid arteries are widely patent into the brain. No siphon stenosis. The anterior and middle cerebral vessels are patent without proximal stenosis, aneurysm or vascular malformation. Fetal origin right PCA. Both vertebral arteries are  widely patent to the basilar. No basilar stenosis. Posterior circulation branch vessels appear normal. MRA NECK FINDINGS Both common carotid arteries widely patent to the bifurcation. Both carotid bifurcations appear normal without stenosis or irregularity. Cervical internal carotid arteries appear widely patent and normal. Both vertebral artery origins appear widely patent. Both vertebral arteries appear normal through the cervical region to the foramen magnum. IMPRESSION: 1. No acute finding by MRI. Mild chronic small-vessel change of the pons and cerebral hemispheric white matter. 2. Normal intracranial MR angiography of the large and medium size vessels. 3. Normal MR angiography of the neck vessels. Electronically Signed   By: Nelson Chimes M.D.   On: 02/12/2020 15:14   MR BRAIN WO CONTRAST  Result Date: 02/12/2020 CLINICAL DATA:  Sudden onset of right-sided weakness. EXAM: MRI HEAD WITHOUT CONTRAST MRA HEAD WITHOUT CONTRAST MRA NECK WITHOUT CONTRAST TECHNIQUE: Multiplanar, multiecho pulse sequences of the brain and surrounding structures were obtained without intravenous contrast. Angiographic images of the Circle of Willis were obtained using MRA technique without intravenous contrast. Angiographic images of the neck were obtained using MRA technique without intravenous contrast. Carotid stenosis measurements (when applicable) are obtained utilizing NASCET criteria, using the distal internal carotid diameter as the denominator. COMPARISON:  CT angiography 02/11/2017.  MRI 02/14/2017. FINDINGS: MRI HEAD FINDINGS Brain: Diffusion imaging does  not show any acute or subacute infarction. No focal abnormality affects the cerebellum. Mild chronic small-vessel change of the pons. Cerebral hemispheres show mild small vessel change of the hemispheric white matter. No cortical or large vessel territory infarction. No mass lesion, hemorrhage, hydrocephalus or extra-axial collection. Vascular: Major vessels at the base  of the brain show flow. Skull and upper cervical spine: Negative Sinuses/Orbits: Clear/normal Other: None MRA HEAD FINDINGS Both internal carotid arteries are widely patent into the brain. No siphon stenosis. The anterior and middle cerebral vessels are patent without proximal stenosis, aneurysm or vascular malformation. Fetal origin right PCA. Both vertebral arteries are widely patent to the basilar. No basilar stenosis. Posterior circulation branch vessels appear normal. MRA NECK FINDINGS Both common carotid arteries widely patent to the bifurcation. Both carotid bifurcations appear normal without stenosis or irregularity. Cervical internal carotid arteries appear widely patent and normal. Both vertebral artery origins appear widely patent. Both vertebral arteries appear normal through the cervical region to the foramen magnum. IMPRESSION: 1. No acute finding by MRI. Mild chronic small-vessel change of the pons and cerebral hemispheric white matter. 2. Normal intracranial MR angiography of the large and medium size vessels. 3. Normal MR angiography of the neck vessels. Electronically Signed   By: Nelson Chimes M.D.   On: 02/12/2020 15:14    PHYSICAL EXAM  Temp:  [97.5 F (36.4 C)-98.1 F (36.7 C)] 97.9 F (36.6 C) (08/26 0753) Pulse Rate:  [88-111] 90 (08/26 0355) Resp:  [16-19] 18 (08/26 0753) BP: (124-157)/(71-83) 157/83 (08/26 0355) SpO2:  [98 %-100 %] 98 % (08/26 0355)  General - Well nourished, well developed, in no apparent distress.  Ophthalmologic - fundi not visualized due to noncooperation.  Cardiovascular - Regular rhythm and rate.  Mental Status -  Level of arousal and orientation to time, place, and person were intact. Language including expression, naming, repetition, comprehension was assessed and found intact. Attention span and concentration were normal. Recent and remote memory were 3/3 registration and 2/3 delayed recall Fund of Knowledge was assessed and was  intact.  Cranial Nerves II - XII - II - Visual field intact OU. III, IV, VI - Extraocular movements intact. Chronic mild left ptosis V - Facial sensation intact bilaterally. VII - Facial movement intact bilaterally. VIII - Hearing & vestibular intact bilaterally. X - Palate elevates symmetrically. XI - Chin turning & shoulder shrug intact bilaterally. XII - Tongue protrusion intact.  Motor Strength - The patients strength was normal in all extremities and pronator drift was absent except chronic right pronation difficulty and right middle and ring finger grip difficulty. Scar tissue at mid right UE.  Bulk was normal and fasciculations were absent.   Motor Tone - Muscle tone was assessed at the neck and appendages and was normal.  Reflexes - The patients reflexes were symmetrical in all extremities and he had no pathological reflexes.  Sensory - Light touch, temperature/pinprick were assessed and were symmetrical.    Coordination - The patient had normal movements in the hands with no ataxia or dysmetria.  Tremor was absent.  Gait and Station - deferred.   ASSESSMENT/PLAN Mr. Peter [name] is a 59 y.o. male with history of gluteal abscess s/p debridement and chills. He also had cellulitis of the buttock in the setting of uncontrolled DM2, as well as ARF superimposed on CKD4, sepsis and hyperkalemia who on day of discharge from the hospital developed RUE, pale, diaphoretic, orthostatic. Symptoms quickly resolved after put back in bed.  Vasovagal syncope due to pain  Pale sweaty with severe right LE pain while sitting on wheelchair for long time -> resolved when putting back on bed  MRI  Unremarkable, no acute infarct  MRA head & neck  unremarkable  2D Echo EF 65-70%. No source of embolus. Mild AV regurg  TEE EF 65-70%. No source of embolus. Mild plaque descending aorta. No PFO  LDL 74  HgbA1c 7.4  VTE prophylaxis - heparin subq  No antithrombotic prior to admission,  now on aspirin 81 mg daily for primary stroke prevention. Continue on discharge.  Orthostatic hypotension Hx of hypertension   Pt had hx of orthostatic hypotension 3 years ago also  BP stable now  Orthostatic vital reflected orthostatic hypotension, significant w/ standing  Ongoing medication adjustment for optimal  Goal  TED hose ordered  Long-term BP goal normotensive  Hyperlipidemia  Home meds:  zocor 20  LDL 74, goal < 100  Resumed zocor 20  continue at discharge  Diabetes type II Uncontrolled  HgbA1c 7.4, goal < 7.0  CBGs   SSI  Close PCP follow up for better DM control  Bacteremia   8/14 blood culture + MRSA  Repeat 8/16 blood culture neg  Now on deptomycin  TEE no endocarditis  Other Active Problems  Cellulitis and abscess of nuttock s/p debridement   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 Please call with questions. No neuro follow up needed at this time. Thanks for the consult.  Rosalin Hawking, MD PhD Stroke Neurology 02/13/2020 2:44 PM    To contact Stroke Continuity provider, please refer to [email or web address] After hours, contact General Neurology

## 2020-02-13 NOTE — Progress Notes (Signed)
PROGRESS NOTE    Peter [name]  YTK:[id number] DOB: 1960-08-26 DOA: 01/31/2020 PCP: Raelene Bott, MD     Brief Narrative:  Peter [name] a 59 y.o. malewith medical history significant for uncontrolled type 2 diabetes, diabetic nephropathy, HTN, HLD, CKD stage IV who presented on 01/31/20 with chief complaint of subjective chills, generalized weakness, nausea and vomiting.  He was found to have multiple draining ulcers of the buttocks for the past week and was found to have leukocytosis, AKI with creatinine of 6, and CT imaging consistent with gluteal fluid collections concerning for abscesses that required debridement by Dr. Lucia Gaskins with surgery on 8/14.  Patient was found to have MRSA bacteremia as well as staph aureus growing from cultures from the abscess.  Empiric vancomycin and meropenem were changed to daptomycin due to acute on chronic kidney disease and culture data with ID consultants assistance.  Patient underwent TTE and TEE which were negative for any signs of endocarditis.  New events last 24 hours / Subjective: Patient in good spirits, no new complaints this morning on examination.  Orthostatic vital signs this morning were positive, patient states that this is not unusual for him.  He states that he was asymptomatic with standing even with a fall and blood pressure.  He denies any dizziness.  Assessment & Plan:   Principal Problem:   Cellulitis and abscess of buttock Active Problems:   Uncontrolled type 2 diabetes mellitus with diabetic nephropathy, without long-term current use of insulin (HCC)   Hyperkalemia   Essential hypertension   Mixed diabetic hyperlipidemia associated with type 2 diabetes mellitus ([place])   Acute renal failure superimposed on stage 4 chronic kidney disease (HCC)   Metabolic acidosis   Sepsis with acute renal failure without septic shock (HCC)   Deep tissue infection of buttocks with multiple abscesses complicated by MRSA bacteremia -Status  post I&D on 8/14 by Dr. Lucia Gaskins -Completed daily PT hydrotherapy, last day 8/24.  Continue twice daily dressing changes at home.  Outpatient referral to wound clinic placed -Continue daptomycin x4 weeks per ID, outpatient follow-up arranged (weekly ck and BMP)  AKI on CKD stage IV and persistent moderate hyperkalemia -Creatinine currently stable at his baseline (4.44--per CareEverywhere). -Outpatient nephrology referral placed -Continue lokelma 10 mg daily until close follow up with nephrology as outpatient  Orthostatic hypotension -157/73-->89/60 -Decreased dose of hydralazine and Norvasc today -Repeat orthostatic vital signs in the morning  Acute encephalopathy, metabolic -MRI brain, MRA head and neck negative.  Neurology consulted for code stroke initially  -Resolved and back to his baseline  Normocytic anemia -Stable  Type 2 diabetes, well controlled -A1c 7.4 -SSI Novolog   Essential hypertension -Continue amlodipine, hydralazine, chlorthalidone.  Dose adjusted as above   DVT prophylaxis:  Place TED hose Start: 02/13/20 0916 heparin injection 5,000 Units Start: 02/06/20 0600 SCDs Start: 02/01/20 0654  Code Status: Full code Family Communication: No family at bedside, discussed with wife yesterday Disposition Plan:   Status is: Inpatient  Remains inpatient appropriate because:Inpatient level of care appropriate due to severity of illness   Dispo: The patient is from: Home              Anticipated d/c is to: Home              Anticipated d/c date is: 1 day              Patient currently is not medically stable to d/c.  Monitor overnight due to positive orthostatic hypotension.  Repeat orthostatic vital sign in the morning   Consultants:   General surgery  Infectious disease  IR  Neurology   Antimicrobials:  Anti-infectives (From admission, onward)   Start     Dose/Rate Route Frequency Ordered Stop   02/12/20 1200  DAPTOmycin (CUBICIN) 600 mg in sodium  chloride 0.9 % IVPB        600 mg 224 mL/hr over 30 Minutes Intravenous Every 48 hours 02/12/20 1046 03/02/20 2359   02/12/20 0000  daptomycin (CUBICIN) IVPB        600 mg Intravenous Every 48 hours 02/12/20 0925 03/08/20 2359   02/04/20 2000  DAPTOmycin (CUBICIN) 600 mg in sodium chloride 0.9 % IVPB  Status:  Discontinued        600 mg 224 mL/hr over 30 Minutes Intravenous Every 48 hours 02/03/20 1426 02/12/20 1046   02/02/20 1430  DAPTOmycin (CUBICIN) 500 mg in sodium chloride 0.9 % IVPB  Status:  Discontinued        500 mg 220 mL/hr over 30 Minutes Intravenous Every 48 hours 02/02/20 1332 02/03/20 1426   02/01/20 0800  meropenem (MERREM) 500 mg in sodium chloride 0.9 % 100 mL IVPB  Status:  Discontinued        500 mg 200 mL/hr over 30 Minutes Intravenous Every 12 hours 02/01/20 0656 02/02/20 1304   02/01/20 0655  vancomycin variable dose per unstable renal function (pharmacist dosing)  Status:  Discontinued         Does not apply See admin instructions 02/01/20 0656 02/02/20 1305   02/01/20 0300  ceFEPIme (MAXIPIME) 2 g in sodium chloride 0.9 % 100 mL IVPB        2 g 200 mL/hr over 30 Minutes Intravenous  Once 02/01/20 0252 02/01/20 0446   02/01/20 0300  vancomycin (VANCOREADY) IVPB 1500 mg/300 mL        1,500 mg 150 mL/hr over 120 Minutes Intravenous  Once 02/01/20 0252 02/01/20 0624       Objective: Vitals:   02/12/20 1900 02/12/20 2051 02/13/20 0355 02/13/20 0753  BP: 124/72 131/77 (!) 157/83   Pulse: 89 88 90   Resp: 16 18 19 18   Temp: 98 F (36.7 C)  98.1 F (36.7 C) 97.9 F (36.6 C)  TempSrc: Oral  Oral Oral  SpO2: 99% 98% 98%   Weight:      Height:        Intake/Output Summary (Last 24 hours) at 02/13/2020 1334 Last data filed at 02/13/2020 1034 Gross per 24 hour  Intake 112 ml  Output 1200 ml  Net -1088 ml   Filed Weights   02/01/20 0313 02/01/20 0901 02/04/20 0704  Weight: 75.3 kg 72.3 kg 72.3 kg    Examination: General exam: Appears calm and  comfortable  Respiratory system: Clear to auscultation. Respiratory effort normal. Cardiovascular system: S1 & S2 heard, RRR. No pedal edema. Gastrointestinal system: Abdomen is nondistended, soft and nontender. Normal bowel sounds heard. Central nervous system: Alert and oriented. Non focal exam. Speech clear  Extremities: Symmetric in appearance bilaterally  Psychiatry: Judgement and insight appear stable. Mood & affect appropriate.    Data Reviewed: I have personally reviewed following labs and imaging studies  CBC: Recent Labs  Lab 02/07/20 0337 02/09/20 0852 02/10/20 0801 02/11/20 0425 02/12/20 0424  WBC 10.1 9.4 6.8 6.9 6.1  NEUTROABS 6.5  --   --   --   --   HGB 7.6* 7.8* 7.5* 7.5* 7.9*  HCT 24.4* 25.5* 24.0* 23.9* 25.6*  MCV 93.8 94.1 93.8 94.5 93.1  PLT 351 322 PLATELET CLUMPS NOTED ON SMEAR, UNABLE TO ESTIMATE 254 454   Basic Metabolic Panel: Recent Labs  Lab 02/09/20 0852 02/09/20 1502 02/10/20 0801 02/10/20 0801 02/11/20 0425 02/11/20 1256 02/11/20 1755 02/12/20 0424 02/13/20 0442  NA 136  --  135  --  135  --   --  134* 132*  K 6.3*   < > 5.5*   < > 5.7* 4.5 4.6 5.0 5.1  CL 104  --  102  --  101  --   --  101 100  CO2 23  --  22  --  23  --   --  22 21*  GLUCOSE 157*  --  176*  --  147*  --   --  128* 157*  BUN 69*  --  72*  --  79*  --   --  83* 90*  CREATININE 4.30*  --  4.38*  --  4.57*  --   --  4.61* 4.67*  CALCIUM 8.4*  --  8.2*  --  8.5*  --   --  8.8* 8.7*   < > = values in this interval not displayed.   GFR: Estimated Creatinine Clearance: 17.4 mL/min (A) (by C-G formula based on SCr of 4.67 mg/dL (H)). Liver Function Tests: No results for input(s): AST, ALT, ALKPHOS, BILITOT, PROT, ALBUMIN in the last 168 hours. No results for input(s): LIPASE, AMYLASE in the last 168 hours. No results for input(s): AMMONIA in the last 168 hours. Coagulation Profile: No results for input(s): INR, PROTIME in the last 168 hours. Cardiac Enzymes: Recent  Labs  Lab 02/10/20 0801 02/12/20 2250  CKTOTAL 100 95   BNP (last 3 results) No results for input(s): PROBNP in the last 8760 hours. HbA1C: No results for input(s): HGBA1C in the last 72 hours. CBG: Recent Labs  Lab 02/12/20 1339 02/12/20 1723 02/12/20 2047 02/13/20 0750 02/13/20 1214  GLUCAP 151* 210* 218* 137* 155*   Lipid Profile: Recent Labs    02/13/20 0442  CHOL 144  HDL 53  LDLCALC 74  TRIG 84  CHOLHDL 2.7   Thyroid Function Tests: No results for input(s): TSH, T4TOTAL, FREET4, T3FREE, THYROIDAB in the last 72 hours. Anemia Panel: Recent Labs    02/10/20 1534  FERRITIN 473*  TIBC 251  IRON 65   Sepsis Labs: No results for input(s): PROCALCITON, LATICACIDVEN in the last 168 hours.  No results found for this or any previous visit (from the past 240 hour(s)).    Radiology Studies: MR ANGIO HEAD WO CONTRAST  Result Date: 02/12/2020 CLINICAL DATA:  Sudden onset of right-sided weakness. EXAM: MRI HEAD WITHOUT CONTRAST MRA HEAD WITHOUT CONTRAST MRA NECK WITHOUT CONTRAST TECHNIQUE: Multiplanar, multiecho pulse sequences of the brain and surrounding structures were obtained without intravenous contrast. Angiographic images of the Circle of Willis were obtained using MRA technique without intravenous contrast. Angiographic images of the neck were obtained using MRA technique without intravenous contrast. Carotid stenosis measurements (when applicable) are obtained utilizing NASCET criteria, using the distal internal carotid diameter as the denominator. COMPARISON:  CT angiography 02/11/2017.  MRI 02/14/2017. FINDINGS: MRI HEAD FINDINGS Brain: Diffusion imaging does not show any acute or subacute infarction. No focal abnormality affects the cerebellum. Mild chronic small-vessel change of the pons. Cerebral hemispheres show mild small vessel change of the hemispheric white matter. No cortical or large vessel territory infarction. No mass lesion, hemorrhage, hydrocephalus  or extra-axial collection. Vascular: Major  vessels at the base of the brain show flow. Skull and upper cervical spine: Negative Sinuses/Orbits: Clear/normal Other: None MRA HEAD FINDINGS Both internal carotid arteries are widely patent into the brain. No siphon stenosis. The anterior and middle cerebral vessels are patent without proximal stenosis, aneurysm or vascular malformation. Fetal origin right PCA. Both vertebral arteries are widely patent to the basilar. No basilar stenosis. Posterior circulation branch vessels appear normal. MRA NECK FINDINGS Both common carotid arteries widely patent to the bifurcation. Both carotid bifurcations appear normal without stenosis or irregularity. Cervical internal carotid arteries appear widely patent and normal. Both vertebral artery origins appear widely patent. Both vertebral arteries appear normal through the cervical region to the foramen magnum. IMPRESSION: 1. No acute finding by MRI. Mild chronic small-vessel change of the pons and cerebral hemispheric white matter. 2. Normal intracranial MR angiography of the large and medium size vessels. 3. Normal MR angiography of the neck vessels. Electronically Signed   By: Nelson Chimes M.D.   On: 02/12/2020 15:14   MR ANGIO NECK WO CONTRAST  Result Date: 02/12/2020 CLINICAL DATA:  Sudden onset of right-sided weakness. EXAM: MRI HEAD WITHOUT CONTRAST MRA HEAD WITHOUT CONTRAST MRA NECK WITHOUT CONTRAST TECHNIQUE: Multiplanar, multiecho pulse sequences of the brain and surrounding structures were obtained without intravenous contrast. Angiographic images of the Circle of Willis were obtained using MRA technique without intravenous contrast. Angiographic images of the neck were obtained using MRA technique without intravenous contrast. Carotid stenosis measurements (when applicable) are obtained utilizing NASCET criteria, using the distal internal carotid diameter as the denominator. COMPARISON:  CT angiography 02/11/2017.  MRI  02/14/2017. FINDINGS: MRI HEAD FINDINGS Brain: Diffusion imaging does not show any acute or subacute infarction. No focal abnormality affects the cerebellum. Mild chronic small-vessel change of the pons. Cerebral hemispheres show mild small vessel change of the hemispheric white matter. No cortical or large vessel territory infarction. No mass lesion, hemorrhage, hydrocephalus or extra-axial collection. Vascular: Major vessels at the base of the brain show flow. Skull and upper cervical spine: Negative Sinuses/Orbits: Clear/normal Other: None MRA HEAD FINDINGS Both internal carotid arteries are widely patent into the brain. No siphon stenosis. The anterior and middle cerebral vessels are patent without proximal stenosis, aneurysm or vascular malformation. Fetal origin right PCA. Both vertebral arteries are widely patent to the basilar. No basilar stenosis. Posterior circulation branch vessels appear normal. MRA NECK FINDINGS Both common carotid arteries widely patent to the bifurcation. Both carotid bifurcations appear normal without stenosis or irregularity. Cervical internal carotid arteries appear widely patent and normal. Both vertebral artery origins appear widely patent. Both vertebral arteries appear normal through the cervical region to the foramen magnum. IMPRESSION: 1. No acute finding by MRI. Mild chronic small-vessel change of the pons and cerebral hemispheric white matter. 2. Normal intracranial MR angiography of the large and medium size vessels. 3. Normal MR angiography of the neck vessels. Electronically Signed   By: Nelson Chimes M.D.   On: 02/12/2020 15:14   MR BRAIN WO CONTRAST  Result Date: 02/12/2020 CLINICAL DATA:  Sudden onset of right-sided weakness. EXAM: MRI HEAD WITHOUT CONTRAST MRA HEAD WITHOUT CONTRAST MRA NECK WITHOUT CONTRAST TECHNIQUE: Multiplanar, multiecho pulse sequences of the brain and surrounding structures were obtained without intravenous contrast. Angiographic images of  the Circle of Willis were obtained using MRA technique without intravenous contrast. Angiographic images of the neck were obtained using MRA technique without intravenous contrast. Carotid stenosis measurements (when applicable) are obtained utilizing NASCET criteria, using the distal  internal carotid diameter as the denominator. COMPARISON:  CT angiography 02/11/2017.  MRI 02/14/2017. FINDINGS: MRI HEAD FINDINGS Brain: Diffusion imaging does not show any acute or subacute infarction. No focal abnormality affects the cerebellum. Mild chronic small-vessel change of the pons. Cerebral hemispheres show mild small vessel change of the hemispheric white matter. No cortical or large vessel territory infarction. No mass lesion, hemorrhage, hydrocephalus or extra-axial collection. Vascular: Major vessels at the base of the brain show flow. Skull and upper cervical spine: Negative Sinuses/Orbits: Clear/normal Other: None MRA HEAD FINDINGS Both internal carotid arteries are widely patent into the brain. No siphon stenosis. The anterior and middle cerebral vessels are patent without proximal stenosis, aneurysm or vascular malformation. Fetal origin right PCA. Both vertebral arteries are widely patent to the basilar. No basilar stenosis. Posterior circulation branch vessels appear normal. MRA NECK FINDINGS Both common carotid arteries widely patent to the bifurcation. Both carotid bifurcations appear normal without stenosis or irregularity. Cervical internal carotid arteries appear widely patent and normal. Both vertebral artery origins appear widely patent. Both vertebral arteries appear normal through the cervical region to the foramen magnum. IMPRESSION: 1. No acute finding by MRI. Mild chronic small-vessel change of the pons and cerebral hemispheric white matter. 2. Normal intracranial MR angiography of the large and medium size vessels. 3. Normal MR angiography of the neck vessels. Electronically Signed   By: Nelson Chimes  M.D.   On: 02/12/2020 15:14      Scheduled Meds: . amLODipine  5 mg Oral Daily  . aspirin EC  81 mg Oral Daily  . Chlorhexidine Gluconate Cloth  6 each Topical Daily  . chlorthalidone  25 mg Oral Daily  . collagenase   Topical TID  . heparin injection (subcutaneous)  5,000 Units Subcutaneous Q8H  . hydrALAZINE  25 mg Oral Q8H  . insulin aspart  0-15 Units Subcutaneous TID AC & HS  . sodium zirconium cyclosilicate  10 g Oral Daily   Continuous Infusions: . DAPTOmycin (CUBICIN)  IV 600 mg (02/12/20 1146)     LOS: 12 days      Time spent: 20 minutes   Dessa Phi, DO [facility] 02/13/2020, 1:34 PM   Available via Epic secure chat 7am-7pm After these hours, please refer to coverage provider listed on [email or web address]

## 2020-02-13 NOTE — Progress Notes (Signed)
Wound care completed around 1350 this shift as ordered, no new issues noted, patient tolerated it well,denied pain, will continue to monitor

## 2020-02-13 NOTE — Plan of Care (Signed)
?  Problem: Clinical Measurements: ?Goal: Ability to maintain clinical measurements within normal limits will improve ?Outcome: Progressing ?Goal: Will remain free from infection ?Outcome: Progressing ?Goal: Diagnostic test results will improve ?Outcome: Progressing ?  ?

## 2020-02-14 LAB — BASIC METABOLIC PANEL
Anion gap: 10 (ref 5–15)
BUN: 92 mg/dL — ABNORMAL HIGH (ref 6–20)
CO2: 21 mmol/L — ABNORMAL LOW (ref 22–32)
Calcium: 8.9 mg/dL (ref 8.9–10.3)
Chloride: 103 mmol/L (ref 98–111)
Creatinine, Ser: 4.56 mg/dL — ABNORMAL HIGH (ref 0.61–1.24)
GFR calc Af Amer: 15 mL/min — ABNORMAL LOW (ref >60)
GFR calc non Af Amer: 13 mL/min — ABNORMAL LOW (ref >60)
Glucose, Bld: 163 mg/dL — ABNORMAL HIGH (ref 70–99)
Potassium: 5 mmol/L (ref 3.5–5.1)
Sodium: 134 mmol/L — ABNORMAL LOW (ref 135–145)

## 2020-02-14 LAB — GLUCOSE, CAPILLARY
Glucose-Capillary: 173 mg/dL — ABNORMAL HIGH (ref 70–99)
Glucose-Capillary: 151 mg/dL — ABNORMAL HIGH (ref 70–99)

## 2020-02-14 MED ORDER — AMLODIPINE BESYLATE 5 MG PO TABS
5.0000 mg | ORAL_TABLET | Freq: Every day | ORAL | 0 refills | Status: DC
Start: 2020-02-14 — End: 2021-10-15

## 2020-02-14 MED ORDER — HEPARIN SOD (PORK) LOCK FLUSH 100 UNIT/ML IV SOLN
250.0000 [IU] | INTRAVENOUS | Status: AC | PRN
  Administered 2020-02-14 (×2): 250 [IU]

## 2020-02-14 MED ORDER — ASPIRIN 81 MG PO TBEC
81.0000 mg | DELAYED_RELEASE_TABLET | Freq: Every day | ORAL | 11 refills | Status: DC
Start: 2020-02-14 — End: 2022-05-24

## 2020-02-14 MED ORDER — HYDRALAZINE HCL 25 MG PO TABS: 50.0000 mg | ORAL_TABLET | Freq: Three times a day (TID) | ORAL | 0 refills | Status: DC

## 2020-02-14 NOTE — TOC Progression Note (Signed)
Transition of Care [facility]) - Progression Note    Patient Details  Name: Peter [name] MRN: [id number] Date of Birth: 1960-10-20  Transition of Care [facility]) CM/SW Contact  Jacalyn Lefevre Edson Snowball, RN Phone Number: 02/14/2020, 10:16 AM  Clinical Narrative:     Patient for discharge today.   Messaged Pam with [facility] and Cory with [employer]. Hospital RN to give today's dose of ABX prior to discharge. Wife has been taught wound care.  Expected Discharge Plan: [place] Barriers to Discharge: Continued Medical Work up  Expected Discharge Plan and Services Expected Discharge Plan: [place]   Discharge Planning Services: CM Consult Post Acute Care Choice: [place] arrangements for the past 2 months: Single Family Home Expected Discharge Date: 02/12/20               DME Arranged: N/A         HH Arranged: RN HH Agency: [place] Date [facility] Agency Contacted: 02/06/20 Time [place]: [place] Representative spoke with at [place]: [place] ([place]) Interventions    Readmission Risk Interventions No flowsheet data found.

## 2020-02-14 NOTE — Progress Notes (Signed)
Patient discharged to home. Verbalizes understanding of all discharge instructions including wound care, discharge medications, and follow up MD visits. Patient's daughter at bedside and taught wound care prior to discharge.

## 2020-02-16 LAB — URINE CULTURE: Culture: 400 — AB

## 2020-02-20 ENCOUNTER — Telehealth: Payer: Self-pay | Admitting: Pharmacist

## 2020-02-20 NOTE — Telephone Encounter (Signed)
Patient is currently on daptomycin until 9/13 for a deep tissue infection. Received labs drawn from 8/31 - SCr 4.75, BUN 95. Last SCr and BUN in hospital on 8/27 was 4.56 and 92, respectively. Patient has chronic renal disease. Will send to provider as FYI.

## 2020-02-28 ENCOUNTER — Other Ambulatory Visit: Payer: Self-pay | Admitting: Infectious Diseases

## 2020-02-28 ENCOUNTER — Telehealth: Payer: Self-pay

## 2020-02-28 DIAGNOSIS — L03317 Cellulitis of buttock: Secondary | ICD-10-CM

## 2020-02-28 DIAGNOSIS — Z931 Gastrostomy status: Secondary | ICD-10-CM

## 2020-02-28 MED ORDER — DOXYCYCLINE HYCLATE 100 MG PO TABS: 100.0000 mg | ORAL_TABLET | Freq: Two times a day (BID) | ORAL | 0 refills | Status: DC

## 2020-02-28 NOTE — Telephone Encounter (Signed)
Received call today from [facility] regarding patient's IJ line. States this is not removed at home and would like to know what next steps would be for patient. Is scheduled to end IV Daptomycin 9/13. Is scheduled to follow up with office on 9/27. Will forward message to MD to advise on IJ line.  Home health would like call back with orders if treatment needs to be extended.  Estill Bamberg, RN P: [phone] Aundria Rud, [place]

## 2020-02-28 NOTE — Telephone Encounter (Signed)
Called IR to schedule appointment for IJ line removal. Patient is aware of plan. Would like appointment to be late morning or afternoon. Home health RN updated as well.

## 2020-02-28 NOTE — Telephone Encounter (Signed)
The line came come out as initially planned on 9/13 if the wound is healing in which case I can prescribe Bactrim to be taken after 9/13.

## 2020-02-28 NOTE — Telephone Encounter (Signed)
Thanks a lot 

## 2020-03-02 ENCOUNTER — Other Ambulatory Visit (HOSPITAL_COMMUNITY): Payer: Self-pay | Admitting: Infectious Diseases

## 2020-03-02 DIAGNOSIS — B9562 Methicillin resistant Staphylococcus aureus infection as the cause of diseases classified elsewhere: Secondary | ICD-10-CM

## 2020-03-02 DIAGNOSIS — A419 Sepsis, unspecified organism: Secondary | ICD-10-CM

## 2020-03-02 DIAGNOSIS — R7881 Bacteremia: Secondary | ICD-10-CM

## 2020-03-02 NOTE — Telephone Encounter (Signed)
Patient scheduled for IJ removal 9/16, 2:00 (arrive [facility] at 1:45). RN contacted patient who passed the phone to his wife. Patient accepted appointment Thursday, 9/16 at 1:45 at [facility].  They will maintain the line until the removal.  Stanton Kidney at [place] notified. Landis Gandy, RN

## 2020-03-05 ENCOUNTER — Ambulatory Visit (HOSPITAL_COMMUNITY)
Admission: RE | Admit: 2020-03-05 | Discharge: 2020-03-05 | Disposition: A | Discharge status: Home / Self Care | Payer: BC Managed Care – PPO | Source: Ambulatory Visit | Attending: Infectious Diseases | Admitting: Infectious Diseases

## 2020-03-05 ENCOUNTER — Other Ambulatory Visit (HOSPITAL_COMMUNITY): Payer: Self-pay | Admitting: Infectious Diseases

## 2020-03-05 DIAGNOSIS — R7881 Bacteremia: Secondary | ICD-10-CM | POA: Diagnosis not present

## 2020-03-05 DIAGNOSIS — B9562 Methicillin resistant Staphylococcus aureus infection as the cause of diseases classified elsewhere: Secondary | ICD-10-CM | POA: Insufficient documentation

## 2020-03-05 DIAGNOSIS — Z452 Encounter for adjustment and management of vascular access device: Secondary | ICD-10-CM | POA: Insufficient documentation

## 2020-03-05 HISTORY — PX: IR REMOVAL TUN CV CATH W/O FL: IMG2289

## 2020-03-05 NOTE — Progress Notes (Signed)
Tunneled Central Line Removal:   Informed written consent was obtained from the patient after a thorough discussion of the procedural risks, benefits and alternatives. All questions were addressed. Maximal Sterile Barrier Technique was utilized including caps, mask, sterile gowns, sterile gloves, sterile drape, hand hygiene and skin antiseptic. A timeout was performed prior to the initiation of the procedure. The patient's right chest and catheter were prepped and draped in a normal sterile fashion. Using manual traction the catheter was easily removed in its entirety. Pressure was held till hemostasis was obtained. A sterile dressing was applied. The patient tolerated the procedure well with no immediate complications.  Soyla Dryer,  [phone] 03/05/2020, 2:25 PM

## 2020-03-16 ENCOUNTER — Other Ambulatory Visit: Payer: Self-pay

## 2020-03-16 ENCOUNTER — Encounter: Payer: Self-pay | Admitting: Infectious Diseases

## 2020-03-16 ENCOUNTER — Ambulatory Visit (INDEPENDENT_AMBULATORY_CARE_PROVIDER_SITE_OTHER): Payer: BC Managed Care – PPO | Admitting: Infectious Diseases

## 2020-03-16 VITALS — BP 119/80 | HR 99 | Temp 97.6°F | Wt 147.0 lb

## 2020-03-16 DIAGNOSIS — R7881 Bacteremia: Secondary | ICD-10-CM | POA: Diagnosis not present

## 2020-03-16 DIAGNOSIS — L0231 Cutaneous abscess of buttock: Secondary | ICD-10-CM | POA: Diagnosis not present

## 2020-03-16 DIAGNOSIS — L03317 Cellulitis of buttock: Secondary | ICD-10-CM | POA: Diagnosis not present

## 2020-03-16 NOTE — Progress Notes (Addendum)
[facility]                                                             [place], [place], [place], [address]                                                                  Phn. [phone]; Fax: [phone]                                                                                        Date: 03/16/20  Reason for Referral: HFU for MRSA bacteremia and Bilateral Buttock abscess   Assessment Multiple Bilateral Gluteal Abscesses s/p I and D MRSA Bacteremia Diabetes Mellitus  Chronic Kidney Disease  Pertinent Labs  02/18/20 WBC 7.6, hb 8.2, platelets 260 Cr 4.75, ESR 53, CRP 7 CPK 63 02/25/20 WBC 7.1, hb 8.5, platelets 256, CR 5.6, CK 143, ESR  47 and CRP 3   Plan Patient has completed 4 weeks of IV daptomycin followed by almost 2 weeks of Doxycyline. 3 of the prior abscess sites have healed and 2 of them have not healed completely. They do not appear to be infected and hence does not need further antibiotics.  Explained about keeping the area clean and dry given the proximity with urethra and anus and high chances of re-infection.  Encouraged to keep the wound care appointment. I have discussed with him symptoms/signs of worsening of wound and call [facility] clinic as needed.  I spent greater than 60 minutes with the patient including greater than 50% of time in face to face counsel of the patient and in coordination of their care.   Rosiland Oz, MD [facility]  Office phone [phone] Fax no. [phone] ______________________________________________________________________________________________________________________  HPI: Peter Briggs Broweris a 59 y.o. malewith a PMH significant for uncontrolled type 2 diabetes, diabetic nephropathy, HTN, HLD, CKD stage IV who is here for HFU in the setting of recent hospital admission for MRSA bacteremia  and bilateral buttock abscesses. Patient was admitted to the hospital 8/13-8/27, found to have multiple draining ulcers of the buttocks. CT imaging consistent with gluteal fluid collections concerning for multiple abscesses. Underwent I and D on 8/14 with cultures positive for MRSA. 8/14 blood cultures positive for MRSA 1/2 sets. TEE 8/17 with no vegetations. Seen by ID and recommended 4 weeks of daptomycin given AKI on CKD. Patient was discharged on 8/27.  He has completed IV dapto as initially planned until 9/13 after which tunneled catheter was removed on 9/16. Patient has taken almost 12 days of Doxycyline after that which was stopped by patient due to GI upset 2 days ago. Patient denies any drainage coming out of  the buttock wounds. He says it has been mostly dry. Denies any pain/tenderness in that area. Denies any fever, chills. 3 of the wounds have healed however, there are 2 remaining wounds in each buttock that has not healed completely. It doesnot have any tenderness, fluctuance or discharge. There is no induration. He is going to see wound care in 10/6.  ROS: Constitutional: DECREASE IN APPETITE Negative for fever, chills, activity change,  fatigue and unexpected weight change.  HENT: Negative for congestion, sore throat, rhinorrhea, sneezing, trouble swallowing and sinus pressure.  Eyes: Negative for photophobia and visual disturbance.  Respiratory: Negative for cough, chest tightness, shortness of breath, wheezing and stridor.  Cardiovascular: Negative for chest pain, palpitations and leg swelling.  Gastrointestinal: Negative for nausea, vomiting, abdominal pain, diarrhea, constipation, blood in stool, abdominal distention and anal bleeding.  Genitourinary: Negative for dysuria, hematuria, flank pain and difficulty urinating.  Musculoskeletal: Negative for myalgias, back pain, joint swelling, arthralgias and gait problem.  Skin: Negative for color change, pallor, rash and wound.    Neurological: Negative for dizziness, tremors, weakness and light-headedness.  Hematological: Negative for adenopathy. Does not bruise/bleed easily.  Psychiatric/Behavioral: Negative for behavioral problems, confusion, sleep disturbance, dysphoric mood, decreased concentration and agitation.   Past Medical History:  Diagnosis Date  . Diabetes mellitus without complication ([place])   . High cholesterol   . Multiple closed anterior-posterior compression fractures of pelvis ([place]) 02/08/2017   Past Surgical History:  Procedure Laterality Date  . ESOPHAGOGASTRODUODENOSCOPY N/A 03/01/2017   Procedure: ESOPHAGOGASTRODUODENOSCOPY (EGD);  Surgeon: Judeth Horn, MD;  Location: [place];  Service: General;  Laterality: N/A;  . EXTERNAL FIXATION PELVIS N/A 02/07/2017   Procedure: EXTERNAL FIXATION PELVIS;  Surgeon: Altamese Dunlap, MD;  Location: [place];  Service: Orthopedics;  Laterality: N/A;  . HERNIA REPAIR    . IR ANGIOGRAM PELVIS SELECTIVE OR SUPRASELECTIVE  02/07/2017  . IR ANGIOGRAM SELECTIVE EACH ADDITIONAL VESSEL  02/07/2017  . IR ANGIOGRAM SELECTIVE EACH ADDITIONAL VESSEL  02/07/2017  . IR ANGIOGRAM VISCERAL SELECTIVE  02/07/2017  . IR ANGIOGRAM VISCERAL SELECTIVE  02/07/2017  . IR CATHETER TUBE CHANGE  04/26/2017  . IR EMBO ART  VEN HEMORR LYMPH EXTRAV  INC GUIDE ROADMAPPING  02/07/2017  . IR EMBO ART  VEN HEMORR LYMPH EXTRAV  INC GUIDE ROADMAPPING  02/07/2017  . IR FLUORO GUIDE CV LINE RIGHT  02/05/2020  . IR FLUORO GUIDED NEEDLE PLC ASPIRATION/INJECTION LOC  03/22/2017  . IR REMOVAL TUN CV CATH W/O FL  03/05/2020  . IR US GUIDE VASC ACCESS RIGHT  02/07/2017  . IR US GUIDE VASC ACCESS RIGHT  02/05/2020  . IRRIGATION AND DEBRIDEMENT ABSCESS N/A 02/01/2020   Procedure: IRRIGATION AND DEBRIDEMENT ABSCESS;  Surgeon: Alphonsa Overall, MD;  Location: [facility];  Service: General;  Laterality: N/A;  . ORIF PELVIC FRACTURE N/A 02/13/2017   Procedure: OPEN REDUCTION INTERNAL FIXATION (ORIF) PELVIC FRACTURE;   Surgeon: Altamese [place], MD;  Location: [place];  Service: Orthopedics;  Laterality: N/A;  . PEG PLACEMENT N/A 03/01/2017   Procedure: PERCUTANEOUS ENDOSCOPIC GASTROSTOMY (PEG) PLACEMENT;  Surgeon: Judeth Horn, MD;  Location: [place];  Service: General;  Laterality: N/A;  . PERCUTANEOUS TRACHEOSTOMY N/A 03/01/2017   Procedure: PERCUTANEOUS TRACHEOSTOMY AT BEDSIDE;  Surgeon: Judeth Horn, MD;  Location: [place];  Service: General;  Laterality: N/A;  . RADIOLOGY WITH ANESTHESIA N/A 02/07/2017   Procedure: RADIOLOGY WITH ANESTHESIA;  Surgeon: Greggory Keen, MD;  Location: [place];  Service: Radiology;  Laterality: N/A;  . SACROILIAC  JOINT FUSION N/A 02/07/2017   Procedure: SACROILIAC JOINT FUSION;  Surgeon: Altamese [place], MD;  Location: [place];  Service: Orthopedics;  Laterality: N/A;  . TEE WITHOUT CARDIOVERSION N/A 02/04/2020   Procedure: TRANSESOPHAGEAL ECHOCARDIOGRAM (TEE);  Surgeon: Buford Dresser, MD;  Location[facility] ENDOSCOPY;  Service: Cardiovascular;  Laterality: N/A;   Current Outpatient Medications on File Prior to Visit  Medication Sig Dispense Refill  . amLODipine (NORVASC) 5 MG tablet Take 1 tablet (5 mg total) by mouth daily. 30 tablet 0  . aspirin EC 81 MG EC tablet Take 1 tablet (81 mg total) by mouth daily. Swallow whole. 30 tablet 11  . carvedilol (COREG) 6.25 MG tablet Take 6.25 mg by mouth in the morning and at bedtime.    . chlorthalidone (HYGROTON) 25 MG tablet Take 1 tablet (25 mg total) by mouth daily. 30 tablet 0  . doxycycline (VIBRA-TABS) 100 MG tablet Take 1 tablet (100 mg total) by mouth 2 (two) times daily. 28 tablet 0  . cromolyn (GASTROCROM) 100 MG/5ML solution Take 100 mg by mouth 4 (four) times daily.    Marland Kitchen glimepiride (AMARYL) 2 MG tablet Take 1 tablet (2 mg total) by mouth daily with breakfast. (Patient taking differently: Take 2 mg by mouth in the morning and at bedtime. ) 30 tablet 1  . hydrALAZINE (APRESOLINE) 25 MG tablet Take 2 tablets (50 mg total) by  mouth every 8 (eight) hours. 180 tablet 0  . sodium bicarbonate 650 MG tablet Take 1,300 mg by mouth 2 (two) times daily.     No current facility-administered medications on file prior to visit.   No Known Allergies  Social History   Socioeconomic History  . Marital status: Married    Spouse name: Not on file  . Number of children: Not on file  . Years of education: Not on file  . Highest education level: Not on file  Occupational History  . Not on file  Tobacco Use  . Smoking status: Never Smoker  . Smokeless tobacco: Never Used  Substance and Sexual Activity  . Alcohol use: No  . Drug use: No  . Sexual activity: Not on file  Other Topics Concern  . Not on file  Social History Narrative  . Not on file   Social Determinants of Health   Financial Resource Strain:   . Difficulty of Paying Living Expenses: Not on file  Food Insecurity:   . Worried About Charity fundraiser in the Last Year: Not on file  . Ran Out of Food in the Last Year: Not on file  Transportation Needs:   . Lack of Transportation (Medical): Not on file  . Lack of Transportation (Non-Medical): Not on file  Physical Activity:   . Days of Exercise per Week: Not on file  . Minutes of Exercise per Session: Not on file  Stress:   . Feeling of Stress : Not on file  Social Connections:   . Frequency of Communication with Friends and Family: Not on file  . Frequency of Social Gatherings with Friends and Family: Not on file  . Attends Religious Services: Not on file  . Active Member of Clubs or Organizations: Not on file  . Attends Archivist Meetings: Not on file  . Marital Status: Not on file  Intimate Partner Violence:   . Fear of Current or Ex-Partner: Not on file  . Emotionally Abused: Not on file  . Physically Abused: Not on file  . Sexually Abused: Not on  file     Vitals BP 119/80   Pulse 99   Temp 97.6 F (36.4 C) (Oral)   Wt 147 lb (66.7 kg)   BMI 21.09 kg/m    Examination   General - not in acute distress, comfortably sitting in chair HEENT - PEERLA, no pallor and no icterus Chest - b/l clear air entry, no additional sounds CVS- Normal s1s2, RRR Abdomen - Soft, Non tender , non distended Ext- no pedal edema Neuro: grossly normal Back - WNL Psych : calm and cooperative Buttocks-          Microbiology Results for orders placed or performed during the hospital encounter of 01/31/20  Blood Culture (routine x 2)     Status: None   Collection Time: 02/01/20  3:04 AM   Specimen: BLOOD RIGHT FOREARM  Result Value Ref Range Status   Specimen Description BLOOD RIGHT FOREARM  Final   Special Requests   Final    BOTTLES DRAWN AEROBIC AND ANAEROBIC Blood Culture adequate volume   Culture   Final    NO GROWTH 5 DAYS Performed at [facility], [address] [place], [place] [address]    Report Status 02/06/2020 FINAL  Final  SARS Coronavirus 2 by RT PCR (hospital order, performed in [place] hospital lab)     Status: None   Collection Time: 02/01/20  3:04 AM  Result Value Ref Range Status   SARS Coronavirus 2 NEGATIVE NEGATIVE Final    Comment: (NOTE) SARS-CoV-2 target nucleic acids are NOT DETECTED.  The SARS-CoV-2 RNA is generally detectable in upper and lower respiratory specimens during the acute phase of infection. The lowest concentration of SARS-CoV-2 viral copies this assay can detect is 250 copies / mL. A negative result does not preclude SARS-CoV-2 infection and should not be used as the sole basis for treatment or other patient management decisions.  A negative result may occur with improper specimen collection / handling, submission of specimen other than nasopharyngeal swab, presence of viral mutation(s) within the areas targeted by this assay, and inadequate number of viral copies (<250 copies / mL). A negative result must be combined with clinical observations, patient history, and epidemiological information.  Fact Sheet  for Patients:   StrictlyIdeas.no  Fact Sheet for Healthcare Providers: BankingDealers.co.za  This test is not yet approved or  cleared by the [place] FDA and has been authorized for detection and/or diagnosis of SARS-CoV-2 by FDA under an Emergency Use Authorization (EUA).  This EUA will remain in effect (meaning this test can be used) for the duration of the COVID-19 declaration under Section 564(b)(1) of the Act, 21 U.S.C. section 360bbb-3(b)(1), unless the authorization is terminated or revoked sooner.  Performed at [facility], [place] [address] [place], [place] [address]   Wound or Superficial Culture     Status: None   Collection Time: 02/01/20  4:13 AM   Specimen: Buttocks; Wound  Result Value Ref Range Status   Specimen Description BUTTOCKS  Final   Special Requests NONE  Final   Gram Stain   Final    NO WBC SEEN ABUNDANT GRAM POSITIVE COCCI RARE GRAM NEGATIVE RODS RARE GRAM POSITIVE RODS Performed at [facility], [address] [place], [place] [address]    Culture   Final    ABUNDANT METHICILLIN RESISTANT STAPHYLOCOCCUS AUREUS   Report Status 02/03/2020 FINAL  Final   Organism ID, Bacteria METHICILLIN RESISTANT STAPHYLOCOCCUS AUREUS  Final  Susceptibility   Methicillin resistant staphylococcus aureus - MIC*    CIPROFLOXACIN >=8 RESISTANT Resistant     ERYTHROMYCIN >=8 RESISTANT Resistant     GENTAMICIN <=0.5 SENSITIVE Sensitive     OXACILLIN >=4 RESISTANT Resistant     TETRACYCLINE <=1 SENSITIVE Sensitive     VANCOMYCIN 1 SENSITIVE Sensitive     TRIMETH/SULFA <=10 SENSITIVE Sensitive     CLINDAMYCIN <=0.25 SENSITIVE Sensitive     RIFAMPIN <=0.5 SENSITIVE Sensitive     Inducible Clindamycin NEGATIVE Sensitive     * ABUNDANT METHICILLIN RESISTANT STAPHYLOCOCCUS AUREUS  Blood Culture (routine x 2)     Status: Abnormal   Collection Time: 02/01/20  4:48 AM   Specimen: BLOOD  Result Value Ref  Range Status   Specimen Description BLOOD RIGHT ANTECUBITAL  Final   Special Requests   Final    BOTTLES DRAWN AEROBIC AND ANAEROBIC Blood Culture adequate volume   Culture  Setup Time   Final    GRAM POSITIVE COCCI IN CLUSTERS IN BOTH AEROBIC AND ANAEROBIC BOTTLES CRITICAL RESULT CALLED TO, READ BACK BY AND VERIFIED WITH: J. LEDFORD,PHARMD [id number] 02/02/2020 Mena Goes Performed at [facility], [address] [place], [place] [address]    Culture METHICILLIN RESISTANT STAPHYLOCOCCUS AUREUS (A)  Final   Report Status 02/03/2020 FINAL  Final   Organism ID, Bacteria METHICILLIN RESISTANT STAPHYLOCOCCUS AUREUS  Final      Susceptibility   Methicillin resistant staphylococcus aureus - MIC*    CIPROFLOXACIN >=8 RESISTANT Resistant     ERYTHROMYCIN >=8 RESISTANT Resistant     GENTAMICIN <=0.5 SENSITIVE Sensitive     OXACILLIN >=4 RESISTANT Resistant     TETRACYCLINE <=1 SENSITIVE Sensitive     VANCOMYCIN 1 SENSITIVE Sensitive     TRIMETH/SULFA <=10 SENSITIVE Sensitive     CLINDAMYCIN <=0.25 SENSITIVE Sensitive     RIFAMPIN <=0.5 SENSITIVE Sensitive     Inducible Clindamycin NEGATIVE Sensitive     * METHICILLIN RESISTANT STAPHYLOCOCCUS AUREUS  Blood Culture ID Panel (Reflexed)     Status: Abnormal   Collection Time: 02/01/20  4:48 AM  Result Value Ref Range Status   Enterococcus faecalis NOT DETECTED NOT DETECTED Final   Enterococcus Faecium NOT DETECTED NOT DETECTED Final   Listeria monocytogenes NOT DETECTED NOT DETECTED Final   Staphylococcus species DETECTED (A) NOT DETECTED Final    Comment: CRITICAL RESULT CALLED TO, READ BACK BY AND VERIFIED WITH: J. LEDFORD,PHARMD [id number] 02/02/2020 T. TYSOR    Staphylococcus aureus (BCID) DETECTED (A) NOT DETECTED Final    Comment: Methicillin (oxacillin)-resistant Staphylococcus aureus (MRSA). MRSA is predictably resistant to beta-lactam antibiotics (except ceftaroline). Preferred therapy is vancomycin unless clinically contraindicated.  Patient requires contact precautions if  hospitalized. CRITICAL RESULT CALLED TO, READ BACK BY AND VERIFIED WITH: J. LEDFORD,PHARMD 0018 02/02/2020 T. TYSOR    Staphylococcus epidermidis NOT DETECTED NOT DETECTED Final   Staphylococcus lugdunensis NOT DETECTED NOT DETECTED Final   Streptococcus species NOT DETECTED NOT DETECTED Final   Streptococcus agalactiae NOT DETECTED NOT DETECTED Final   Streptococcus pneumoniae NOT DETECTED NOT DETECTED Final   Streptococcus pyogenes NOT DETECTED NOT DETECTED Final   A.calcoaceticus-baumannii NOT DETECTED NOT DETECTED Final   Bacteroides fragilis NOT DETECTED NOT DETECTED Final   Enterobacterales NOT DETECTED NOT DETECTED Final   Enterobacter cloacae complex NOT DETECTED NOT DETECTED Final   Escherichia coli NOT DETECTED NOT DETECTED Final   Klebsiella aerogenes NOT DETECTED NOT DETECTED Final   Klebsiella oxytoca NOT DETECTED NOT DETECTED Final  Klebsiella pneumoniae NOT DETECTED NOT DETECTED Final   Proteus species NOT DETECTED NOT DETECTED Final   Salmonella species NOT DETECTED NOT DETECTED Final   Serratia marcescens NOT DETECTED NOT DETECTED Final   Haemophilus influenzae NOT DETECTED NOT DETECTED Final   Neisseria meningitidis NOT DETECTED NOT DETECTED Final   Pseudomonas aeruginosa NOT DETECTED NOT DETECTED Final   Stenotrophomonas maltophilia NOT DETECTED NOT DETECTED Final   Candida albicans NOT DETECTED NOT DETECTED Final   Candida auris NOT DETECTED NOT DETECTED Final   Candida glabrata NOT DETECTED NOT DETECTED Final   Candida krusei NOT DETECTED NOT DETECTED Final   Candida parapsilosis NOT DETECTED NOT DETECTED Final   Candida tropicalis NOT DETECTED NOT DETECTED Final   Cryptococcus neoformans/gattii NOT DETECTED NOT DETECTED Final   Meth resistant mecA/C and MREJ DETECTED (A) NOT DETECTED Final    Comment: CRITICAL RESULT CALLED TO, READ BACK BY AND VERIFIED WITH: J. LEDFORD,PHARMD 0018 02/02/2020 T. TYSOR Performed at  [facility], [address], [place], [place] [address]   Aerobic/Anaerobic Culture (surgical/deep wound)     Status: None   Collection Time: 02/01/20 11:31 AM   Specimen: Buttocks; Abscess  Result Value Ref Range Status   Specimen Description ABSCESS  Final   Special Requests BUTTOCKS  Final   Gram Stain   Final    RARE WBC PRESENT, PREDOMINANTLY PMN FEW GRAM POSITIVE COCCI    Culture   Final    MODERATE METHICILLIN RESISTANT STAPHYLOCOCCUS AUREUS NO ANAEROBES ISOLATED Performed at [facility], [address], [place], [place] [address]    Report Status 02/06/2020 FINAL  Final   Organism ID, Bacteria METHICILLIN RESISTANT STAPHYLOCOCCUS AUREUS  Final      Susceptibility   Methicillin resistant staphylococcus aureus - MIC*    CIPROFLOXACIN >=8 RESISTANT Resistant     ERYTHROMYCIN >=8 RESISTANT Resistant     GENTAMICIN <=0.5 SENSITIVE Sensitive     OXACILLIN >=4 RESISTANT Resistant     TETRACYCLINE <=1 SENSITIVE Sensitive     VANCOMYCIN 1 SENSITIVE Sensitive     TRIMETH/SULFA <=10 SENSITIVE Sensitive     CLINDAMYCIN <=0.25 SENSITIVE Sensitive     RIFAMPIN <=0.5 SENSITIVE Sensitive     Inducible Clindamycin NEGATIVE Sensitive     * MODERATE METHICILLIN RESISTANT STAPHYLOCOCCUS AUREUS  Urine culture     Status: Abnormal   Collection Time: 02/02/20  5:05 AM   Specimen: In/Out Cath Urine  Result Value Ref Range Status   Specimen Description IN/OUT CATH URINE  Final   Special Requests NONE  Final   Culture (A)  Final    400 COLONIES/mL GRAM NEGATIVE RODS SEE SEPARATE REPORT FOR ID AND SUSCEPTIBILITY RESULTS Performed at [employer] Performed at [facility], [address] [place], [place] [address]    Report Status 02/16/2020 FINAL  Final  MRSA PCR Screening     Status: None   Collection Time: 02/02/20  5:16 AM   Specimen: Nasal Mucosa; Nasopharyngeal  Result Value Ref Range Status   MRSA by PCR NEGATIVE NEGATIVE Final    Comment:        The  GeneXpert MRSA Assay (FDA approved for NASAL specimens only), is one component of a comprehensive MRSA colonization surveillance program. It is not intended to diagnose MRSA infection nor to guide or monitor treatment for MRSA infections. Performed at [facility], [place] [address] [place], [place] [address]   Culture, blood (routine x 2)  Status: None   Collection Time: 02/03/20  4:02 AM   Specimen: BLOOD  Result Value Ref Range Status   Specimen Description BLOOD SITE NOT SPECIFIED  Final   Special Requests   Final    BOTTLES DRAWN AEROBIC ONLY Blood Culture results may not be optimal due to an inadequate volume of blood received in culture bottles   Culture   Final    NO GROWTH 5 DAYS Performed at [facility], [address] [place], [place] [address]    Report Status 02/08/2020 FINAL  Final  Culture, blood (routine x 2)     Status: None   Collection Time: 02/03/20  4:13 AM   Specimen: BLOOD  Result Value Ref Range Status   Specimen Description BLOOD SITE NOT SPECIFIED  Final   Special Requests   Final    BOTTLES DRAWN AEROBIC ONLY Blood Culture results may not be optimal due to an inadequate volume of blood received in culture bottles   Culture   Final    NO GROWTH 5 DAYS Performed at [facility], [place] [address], [place], [place] [address]    Report Status 02/08/2020 FINAL  Final    Recent laboratory  CBC Latest Ref Rng & Units 02/12/2020 02/11/2020 02/10/2020  WBC 4.0 - 10.5 K/uL 6.1 6.9 6.8  Hemoglobin 13.0 - 17.0 g/dL 7.9(L) 7.5(L) 7.5(L)  Hematocrit 39 - 52 % 25.6(L) 23.9(L) 24.0(L)  Platelets 150 - 400 K/uL 260 254 PLATELET CLUMPS NOTED ON SMEAR, UNABLE TO ESTIMATE   CMP Latest Ref Rng & Units 02/14/2020 02/13/2020 02/12/2020  Glucose 70 - 99 mg/dL 163(H) 157(H) 128(H)  BUN 6 - 20 mg/dL 92(H) 90(H) 83(H)  Creatinine 0.61 - 1.24 mg/dL 4.56(H) 4.67(H) 4.61(H)  Sodium 135 - 145 mmol/L 134(L) 132(L) 134(L)  Potassium 3.5 - 5.1 mmol/L 5.0 5.1 5.0    Chloride 98 - 111 mmol/L 103 100 101  CO2 22 - 32 mmol/L 21(L) 21(L) 22  Calcium 8.9 - 10.3 mg/dL 8.9 8.7(L) 8.8(L)  Total Protein 6.5 - 8.1 g/dL - - -  Total Bilirubin 0.3 - 1.2 mg/dL - - -  Alkaline Phos 38 - 126 U/L - - -  AST 15 - 41 U/L - - -  ALT 0 - 44 U/L - - -    All pertinent labs/Imagings/notes reviewed. All pertinent plain films and CT images have been personally visualized and interpreted; radiology reports have been reviewed. Decision making incorporated into the Impression / Recommendations.  CT abdomen/pelvis 02/01/20 FINDINGS: Lower chest: Minimal subpleural scarring in the left lower lobe. No acute airspace disease. No pleural effusion.  Hepatobiliary: No focal hepatic abnormality on noncontrast exam. Multiple calcified gallstones without pericholecystic inflammation. There is no biliary dilatation.  Pancreas: No ductal dilatation or inflammation.  Spleen: Normal in size. No focal abnormality on noncontrast exam. Coils again seen in the splenic artery.  Adrenals/Urinary Tract: Previous left adrenal hematoma has resolved, there is mild residual adrenal thickening. Normal right adrenal gland. Mild left hydronephrosis and hydroureter to the level of the distal pelvis. No stone or cause for obstruction. Mild prominence of the right ureter without hydronephrosis. There is symmetric bilateral perinephric edema. Urinary bladder is minimally distended.  Stomach/Bowel: Small hiatal hernia. Stomach otherwise unremarkable. There is no bowel obstruction. No evidence of bowel wall thickening or inflammation. Few lower pelvic small bowel loops are fluid-filled but nondilated or inflamed. Normal appendix. High-riding cecum. Moderate volume of colonic stool. There is colonic tortuosity. No colonic wall  thickening or inflammation.  Vascular/Lymphatic: Abdominal aorta is normal in caliber. Coiling of the splenic artery. Multiple small retroperitoneal lymph nodes,  not enlarged by size criteria and likely reactive. Small bilateral inguinal nodes.  Reproductive: Prostate unremarkable. Right testis may be within the inguinal canal, series 3, image 86.  Other: No ascites or free air. Subcutaneous fluid collection involving the left gluteal soft tissues about the gluteal crease measuring 2.8 x 1.4 x 4.1 cm. Mild adjacent soft tissue edema tracks into the gluteal fold. Fluid collection in the inferior right gluteal soft tissues abutting the gluteal crease measures 3.0 x 2.4 x 3.8 cm. There is no air within either fluid collection or subcutaneous soft tissues. No extension into the anorectum.  Musculoskeletal: Bilateral sacroiliac joint pinning and pubic symphyseal fixation. Remote left transverse process fractures in the lumbar spine. Remote left rib fractures. No acute osseous abnormalities.  IMPRESSION: 1. Two gluteal fluid collections, 1 on the right and 1 on the left. Subcutaneous fluid collection involving the left gluteal soft tissues abut the gluteal crease measuring 2.8 x 1.4 x 4.1 cm, suspicious for abscess. Fluid collection in the inferior right gluteal soft tissues abutting the gluteal crease measures 3.0 x 2.4 x 3.8 cm. No air within either fluid collection or subcutaneous soft tissues. 2. Mild left hydronephrosis and hydroureter to the level of the distal pelvis. No stone or cause for obstruction. Bilateral perinephric edema, nonspecific. Decompressed urinary bladder which appears thick walled. 3. Cholelithiasis without gallbladder inflammation. 4. Small hiatal hernia. 5. Right testis may be within the inguinal canal. Recommend correlation with physical exam.

## 2020-03-25 ENCOUNTER — Encounter (HOSPITAL_BASED_OUTPATIENT_CLINIC_OR_DEPARTMENT_OTHER): Payer: BC Managed Care – PPO | Attending: Physician Assistant | Admitting: Physician Assistant

## 2020-03-25 DIAGNOSIS — N184 Chronic kidney disease, stage 4 (severe): Secondary | ICD-10-CM | POA: Diagnosis not present

## 2020-03-25 DIAGNOSIS — L98412 Non-pressure chronic ulcer of buttock with fat layer exposed: Secondary | ICD-10-CM | POA: Insufficient documentation

## 2020-03-25 DIAGNOSIS — L0231 Cutaneous abscess of buttock: Secondary | ICD-10-CM | POA: Insufficient documentation

## 2020-03-25 DIAGNOSIS — I129 Hypertensive chronic kidney disease with stage 1 through stage 4 chronic kidney disease, or unspecified chronic kidney disease: Secondary | ICD-10-CM | POA: Insufficient documentation

## 2020-03-27 NOTE — Progress Notes (Signed)
Peter [name], Peter [name] ([id number]) Visit Report for 03/25/2020 Allergy List Details Patient Name: Date of Service: Peter [name], Peter [name] 03/25/2020 2:45 PM Medical Record Number: [id number] Patient Account Number: [id number] Date of Birth/Sex: Treating RN: Aug 30, 1960 (59 y.o. Janyth Contes Primary Care Bethene Hankinson: Raelene Bott Other Clinician: Referring Parley Pidcock: Treating Omarr Hann/Extender: Melburn Hake, Hoyt CHO I, JENNIFER Weeks in Treatment: 0 Allergies Active Allergies No Known Drug Allergies Severity: Moderate Allergy Notes Electronic Signature(s) Signed: 03/27/2020 5:41:32 PM By: Levan Hurst RN, BSN Entered By: Levan Hurst on 03/25/2020 15:31:34 -------------------------------------------------------------------------------- Arrival Information Details Patient Name: Date of Service: Peter [name] 03/25/2020 2:45 PM Medical Record Number: [id number] Patient Account Number: [id number] Date of Birth/Sex: Treating RN: 23-Sep-1960 (59 y.o. Janyth Contes Primary Care Harlen Danford: Raelene Bott Other Clinician: Referring Genni Buske: Treating Camil Wilhelmsen/Extender: Worthy Keeler CHO I, JENNIFER Weeks in Treatment: 0 Visit Information Patient Arrived: Walker Arrival Time: 15:26 Accompanied By: sister Transfer Assistance: None Patient Identification Verified: Yes Secondary Verification Process Completed: Yes Patient Requires Transmission-Based Precautions: No Patient Has Alerts: No Electronic Signature(s) Signed: 03/27/2020 5:41:32 PM By: Levan Hurst RN, BSN Entered By: Levan Hurst on 03/25/2020 15:27:52 -------------------------------------------------------------------------------- Clinic Level of Care Assessment Details Patient Name: Date of Service: Peter [name], Peter [name] 03/25/2020 2:45 PM Medical Record Number: [id number] Patient Account Number: [id number] Date of Birth/Sex: Treating RN: 10/01/1960 (59 y.o. [name] Primary Care Seleen Walter: Raelene Bott Other  Clinician: Referring Nickoles Gregori: Treating Ashea Winiarski/Extender: Worthy Keeler CHO I, JENNIFER Weeks in Treatment: 0 Clinic Level of Care Assessment Items TOOL 2 Quantity Score []  - 0 Use when only an EandM is performed on the INITIAL visit ASSESSMENTS - Nursing Assessment / Reassessment X- 1 20 General Physical Exam (combine w/ comprehensive assessment (listed just below) when performed on new pt. evals) X- 1 25 Comprehensive Assessment (HX, ROS, Risk Assessments, Wounds Hx, etc.) ASSESSMENTS - Wound and Skin A ssessment / Reassessment []  - 0 Simple Wound Assessment / Reassessment - one wound X- 2 5 Complex Wound Assessment / Reassessment - multiple wounds []  - 0 Dermatologic / Skin Assessment (not related to wound area) ASSESSMENTS - Ostomy and/or Continence Assessment and Care []  - 0 Incontinence Assessment and Management []  - 0 Ostomy Care Assessment and Management (repouching, etc.) PROCESS - Coordination of Care X - Simple Patient / Family Education for ongoing care 1 15 []  - 0 Complex (extensive) Patient / Family Education for ongoing care X- 1 10 Staff obtains Programmer, systems, Records, T Results / Process Orders est []  - 0 Staff telephones HHA, Nursing Homes / Clarify orders / etc []  - 0 Routine Transfer to another Facility (non-emergent condition) []  - 0 Routine Hospital Admission (non-emergent condition) X- 1 15 New Admissions / Biomedical engineer / Ordering NPWT Apligraf, etc. , []  - 0 Emergency Hospital Admission (emergent condition) X- 1 10 Simple Discharge Coordination []  - 0 Complex (extensive) Discharge Coordination PROCESS - Special Needs []  - 0 Pediatric / Minor Patient Management []  - 0 Isolation Patient Management []  - 0 Hearing / Language / Visual special needs []  - 0 Assessment of Community assistance (transportation, D/C planning, etc.) []  - 0 Additional assistance / Altered mentation []  - 0 Support Surface(s) Assessment (bed, cushion,  seat, etc.) INTERVENTIONS - Wound Cleansing / Measurement X- 1 5 Wound Imaging (photographs - any number of wounds) []  - 0 Wound Tracing (instead of photographs) []  - 0 Simple Wound Measurement - one wound X- 2 5 Complex Wound Measurement - multiple wounds []  - 0 Simple  Wound Cleansing - one wound X- 2 5 Complex Wound Cleansing - multiple wounds INTERVENTIONS - Wound Dressings X - Small Wound Dressing one or multiple wounds 2 10 []  - 0 Medium Wound Dressing one or multiple wounds []  - 0 Large Wound Dressing one or multiple wounds []  - 0 Application of Medications - injection INTERVENTIONS - Miscellaneous []  - 0 External ear exam []  - 0 Specimen Collection (cultures, biopsies, blood, body fluids, etc.) []  - 0 Specimen(s) / Culture(s) sent or taken to Lab for analysis []  - 0 Patient Transfer (multiple staff / Harrel Lemon Lift / Similar devices) []  - 0 Simple Staple / Suture removal (25 or less) []  - 0 Complex Staple / Suture removal (26 or more) []  - 0 Hypo / Hyperglycemic Management (close monitor of Blood Glucose) []  - 0 Ankle / Brachial Index (ABI) - do not check if billed separately Has the patient been seen at the hospital within the last three years: Yes Total Score: 150 Level Of Care: New/Established - Level 4 Electronic Signature(s) Signed: 03/25/2020 6:14:29 PM By: Baruch Gouty RN, BSN Entered By: Baruch Gouty on 03/25/2020 16:24:14 -------------------------------------------------------------------------------- Encounter Discharge Information Details Patient Name: Date of Service: Peter [name] 03/25/2020 2:45 PM Medical Record Number: [id number] Patient Account Number: [id number] Date of Birth/Sex: Treating RN: December 04, 1960 (58 y.o. Oval Linsey Primary Care Baruc Tugwell: Raelene Bott Other Clinician: Referring Syana Degraffenreid: Treating Caraline Deutschman/Extender: Melburn Hake, Hoyt CHO I, JENNIFER Weeks in Treatment: 0 Encounter Discharge Information Items Discharge  Condition: Stable Ambulatory Status: Walker Discharge Destination: Home Transportation: Private Auto Accompanied By: self Schedule Follow-up Appointment: Yes Clinical Summary of Care: Patient Declined Electronic Signature(s) Signed: 03/25/2020 5:53:32 PM By: Carlene Coria RN Entered By: Carlene Coria on 03/25/2020 16:45:06 -------------------------------------------------------------------------------- [place] Details Patient Name: Date of Service: Peter [name] 03/25/2020 2:45 PM Medical Record Number: [id number] Patient Account Number: [id number] Date of Birth/Sex: Treating RN: [birth year] (59 y.o. [name] Primary Care Rhiannon Sassaman: Raelene Bott Other Clinician: Referring Sparrow Sanzo: Treating Romone Shaff/Extender: Worthy Keeler CHO I, JENNIFER Weeks in Treatment: 0 Active Inactive Abuse / Safety / Falls / Self Care Management Nursing Diagnoses: Potential for falls Goals: Patient/caregiver will verbalize/demonstrate measures taken to prevent injury and/or falls Date Initiated: 03/25/2020 Target Resolution Date: 04/22/2020 Goal Status: Active Interventions: Assess fall risk on admission and as needed Notes: Nutrition Nursing Diagnoses: Impaired glucose control: actual or potential Potential for alteratiion in Nutrition/Potential for imbalanced nutrition Goals: Patient/caregiver will maintain therapeutic glucose control Date Initiated: 03/25/2020 Target Resolution Date: 04/22/2020 Goal Status: Active Interventions: Assess HgA1c results as ordered upon admission and as needed Assess patient nutrition upon admission and as needed per policy Treatment Activities: Patient referred to Primary Care Physician for further nutritional evaluation : 03/25/2020 Notes: Wound/Skin Impairment Nursing Diagnoses: Impaired tissue integrity Knowledge deficit related to ulceration/compromised skin integrity Goals: Patient/caregiver will verbalize understanding of  skin care regimen Date Initiated: 03/25/2020 Target Resolution Date: 04/22/2020 Goal Status: Active Ulcer/skin breakdown will have a volume reduction of 30% by week 4 Date Initiated: 03/25/2020 Target Resolution Date: 04/22/2020 Goal Status: Active Interventions: Assess patient/caregiver ability to obtain necessary supplies Assess patient/caregiver ability to perform ulcer/skin care regimen upon admission and as needed Assess ulceration(s) every visit Treatment Activities: Skin care regimen initiated : 03/25/2020 Topical wound management initiated : 03/25/2020 Notes: Electronic Signature(s) Signed: 03/25/2020 6:14:29 PM By: Baruch Gouty RN, BSN Entered By: Baruch Gouty on 03/25/2020 16:22:13 -------------------------------------------------------------------------------- Pain Assessment Details Patient Name: Date of Service: Peter [name]. 03/25/2020  2:45 PM Medical Record Number: [id number] Patient Account Number: [id number] Date of Birth/Sex: Treating RN: May 06, 1961 (59 y.o. Janyth Contes Primary Care Antoneo Ghrist: Raelene Bott Other Clinician: Referring Anabela Crayton: Treating Samer Dutton/Extender: Worthy Keeler CHO I, JENNIFER Weeks in Treatment: 0 Active Problems Location of Pain Severity and Description of Pain Patient Has Paino No Site Locations Pain Management and Medication Current Pain Management: Electronic Signature(s) Signed: 03/27/2020 5:41:32 PM By: Levan Hurst RN, BSN Entered By: Levan Hurst on 03/25/2020 15:56:31 -------------------------------------------------------------------------------- Patient/Caregiver Education Details Patient Name: Date of Service: Peter [name] 10/6/2021andnbsp2:45 PM Medical Record Number: [id number] Patient Account Number: [id number] Date of Birth/Gender: Treating RN: 10-Dec-1960 (59 y.o. [name] Primary Care Physician: Raelene Bott Other Clinician: Referring Physician: Treating Physician/Extender:  Worthy Keeler CHO I, JENNIFER Weeks in Treatment: 0 Education Assessment Education Provided To: Patient Education Topics Provided [place]: o Handouts: Welcome T The [place] o Methods: Explain/Verbal, Printed Responses: Reinforcements needed, State content correctly Wound/Skin Impairment: Handouts: Caring for Your Ulcer, Skin Care Do's and Dont's Methods: Explain/Verbal, Printed Responses: Reinforcements needed, State content correctly Electronic Signature(s) Signed: 03/25/2020 6:14:29 PM By: Baruch Gouty RN, BSN Entered By: Baruch Gouty on 03/25/2020 16:23:10 -------------------------------------------------------------------------------- Wound Assessment Details Patient Name: Date of Service: Peter [name] 03/25/2020 2:45 PM Medical Record Number: [id number] Patient Account Number: [id number] Date of Birth/Sex: Treating RN: Feb 25, 1961 (59 y.o. Janyth Contes Primary Care Rosalie Buenaventura: Raelene Bott Other Clinician: Referring Larson Limones: Treating Merrell Borsuk/Extender: Worthy Keeler CHO I, JENNIFER Weeks in Treatment: 0 Wound Status Wound Number: 1 Primary Etiology: Abscess Wound Location: Left Gluteal fold Wound Status: Open Wounding Event: Bump Comorbid History: Anemia, Pneumothorax, Hypertension, Type II Diabetes Date Acquired: 01/31/2020 Weeks Of Treatment: 0 Clustered Wound: No Wound Measurements Length: (cm) 3.5 Width: (cm) 0.5 Depth: (cm) 0.3 Area: (cm) 1.374 Volume: (cm) 0.412 % Reduction in Area: % Reduction in Volume: Epithelialization: None Tunneling: No Undermining: No Wound Description Classification: Full Thickness Without Exposed Support Structures Wound Margin: Flat and Intact Exudate Amount: Medium Exudate Type: Serosanguineous Exudate Color: red, brown Foul Odor After Cleansing: No Slough/Fibrino No Wound Bed Granulation Amount: Large (67-100%) Exposed Structure Granulation Quality:  Pink Fascia Exposed: No Necrotic Amount: None Present (0%) Fat Layer (Subcutaneous Tissue) Exposed: Yes Tendon Exposed: No Muscle Exposed: No Joint Exposed: No Bone Exposed: No Treatment Notes Wound #1 (Left Gluteal fold) 1. Cleanse With Wound Cleanser 3. Primary Dressing Applied Calcium Alginate Ag 4. Secondary Dressing Foam Border Dressing Electronic Signature(s) Signed: 03/27/2020 5:41:32 PM By: Levan Hurst RN, BSN Entered By: Levan Hurst on 03/25/2020 15:52:47 -------------------------------------------------------------------------------- Wound Assessment Details Patient Name: Date of Service: Peter [name] 03/25/2020 2:45 PM Medical Record Number: [id number] Patient Account Number: [id number] Date of Birth/Sex: Treating RN: March 19, 1961 (59 y.o. Janyth Contes Primary Care Nash Bolls: Raelene Bott Other Clinician: Referring Aylla Huffine: Treating Dorothie Wah/Extender: Worthy Keeler CHO I, JENNIFER Weeks in Treatment: 0 Wound Status Wound Number: 2 Primary Etiology: Abscess Wound Location: Right Gluteus Wound Status: Open Wounding Event: Bump Comorbid History: Anemia, Pneumothorax, Hypertension, Type II Diabetes Date Acquired: 01/31/2020 Weeks Of Treatment: 0 Clustered Wound: No Wound Measurements Length: (cm) 0.5 Width: (cm) 0.2 Depth: (cm) 0.1 Area: (cm) 0.079 Volume: (cm) 0.008 % Reduction in Area: % Reduction in Volume: Epithelialization: None Tunneling: No Undermining: No Wound Description Classification: Full Thickness Without Exposed Support Structures Wound Margin: Flat and Intact Exudate Amount: Small Exudate Type: Serosanguineous Exudate Color: red, brown Foul Odor After Cleansing: No Slough/Fibrino  No Wound Bed Granulation Amount: Large (67-100%) Exposed Structure Granulation Quality: Pink Fascia Exposed: No Necrotic Amount: None Present (0%) Fat Layer (Subcutaneous Tissue) Exposed: Yes Tendon Exposed: No Muscle Exposed:  No Joint Exposed: No Bone Exposed: No Treatment Notes Wound #2 (Right Gluteus) 1. Cleanse With Wound Cleanser 3. Primary Dressing Applied Calcium Alginate Ag 4. Secondary Dressing Foam Border Dressing Electronic Signature(s) Signed: 03/27/2020 5:41:32 PM By: Levan Hurst RN, BSN Entered By: Levan Hurst on 03/25/2020 15:53:35 -------------------------------------------------------------------------------- Vitals Details Patient Name: Date of Service: Peter [name] 03/25/2020 2:45 PM Medical Record Number: [id number] Patient Account Number: [id number] Date of Birth/Sex: Treating RN: 08/31/1960 (59 y.o. Janyth Contes Primary Care Ezme Duch: Other Clinician: Raelene Bott Referring Dearl Rudden: Treating Chantalle Defilippo/Extender: Worthy Keeler CHO I, JENNIFER Weeks in Treatment: 0 Vital Signs Time Taken: 15:27 Temperature (F): 98.4 Height (in): 70 Pulse (bpm): 90 Source: Stated Respiratory Rate (breaths/min): 16 Weight (lbs): 148 Blood Pressure (mmHg): 146/80 Source: Stated Capillary Blood Glucose (mg/dl): 130 Body Mass Index (BMI): 21.2 Reference Range: 80 - 120 mg / dl Notes glucose per pt report Electronic Signature(s) Signed: 03/27/2020 5:41:32 PM By: Levan Hurst RN, BSN Entered By: Levan Hurst on 03/25/2020 15:31:20

## 2020-03-27 NOTE — Progress Notes (Signed)
PER, BEAGLEY ([id number]) Visit Report for 03/25/2020 Abuse/Suicide Risk Screen Details Patient Name: Date of Service: Peter [name], Peter [name] 03/25/2020 2:45 PM Medical Record Number: [id number] Patient Account Number: [id number] Date of Birth/Sex: Treating RN: [birth year] (59 y.o. Janyth Contes Primary Care Ladon Vandenberghe: Raelene Bott Other Clinician: Referring Nikiesha Milford: Treating Fatema Rabe/Extender: Melburn Hake, Hoyt CHO I, JENNIFER Weeks in Treatment: 0 Abuse/Suicide Risk Screen Items Answer ABUSE RISK SCREEN: Has anyone close to you tried to hurt or harm you recentlyo No Do you feel uncomfortable with anyone in your familyo No Has anyone forced you do things that you didnt want to doo No Electronic Signature(s) Signed: 03/27/2020 5:41:32 PM By: Levan Hurst RN, BSN Entered By: Levan Hurst on 03/25/2020 15:37:22 -------------------------------------------------------------------------------- Activities of Daily Living Details Patient Name: Date of Service: Peter [name], Peter [name] 03/25/2020 2:45 PM Medical Record Number: [id number] Patient Account Number: [id number] Date of Birth/Sex: Treating RN: Aug 05, 1960 (59 y.o. Janyth Contes Primary Care Andelyn Spade: Raelene Bott Other Clinician: Referring Liliauna Santoni: Treating Tanganika Barradas/Extender: Worthy Keeler CHO I, JENNIFER Weeks in Treatment: 0 Activities of Daily Living Items Answer Activities of Daily Living (Please select one for each item) Drive Automobile Need Assistance T Medications ake Completely Able Use T elephone Completely Able Care for Appearance Completely Able Use T oilet Completely Able Bath / Shower Completely Able Dress Self Completely Able Feed Self Completely Able Walk Completely Able Get In / Out Bed Completely Able Housework Need Assistance Prepare Meals Need Assistance Handle Money Completely Able Shop for Self Need Assistance Electronic Signature(s) Signed: 03/27/2020 5:41:32 PM By: Levan Hurst RN,  BSN Entered By: Levan Hurst on 03/25/2020 15:38:19 -------------------------------------------------------------------------------- Education Screening Details Patient Name: Date of Service: Peter [name] 03/25/2020 2:45 PM Medical Record Number: [id number] Patient Account Number: [id number] Date of Birth/Sex: Treating RN: 1961-05-28 (59 y.o. Janyth Contes Primary Care Felisha Claytor: Raelene Bott Other Clinician: Referring Corrin Sieling: Treating Kiing Deakin/Extender: Worthy Keeler CHO I, JENNIFER Weeks in Treatment: 0 Primary Learner Assessed: Patient Learning Preferences/Education Level/Primary Language Learning Preference: Explanation, Demonstration, Printed Material Preferred Language: English Cognitive Barrier Language Barrier: No Translator Needed: No Memory Deficit: No Emotional Barrier: No Cultural/Religious Beliefs Affecting Medical Care: No Physical Barrier Impaired Vision: No Impaired Hearing: No Decreased Hand dexterity: No Knowledge/Comprehension Knowledge Level: High Comprehension Level: High Ability to understand written instructions: High Ability to understand verbal instructions: High Motivation Anxiety Level: Calm Cooperation: Cooperative Education Importance: Acknowledges Need Interest in Health Problems: Asks Questions Perception: Coherent Willingness to Engage in Self-Management High Activities: Readiness to Engage in Self-Management High Activities: Electronic Signature(s) Signed: 03/27/2020 5:41:32 PM By: Levan Hurst RN, BSN Entered By: Levan Hurst on 03/25/2020 15:39:01 -------------------------------------------------------------------------------- Fall Risk Assessment Details Patient Name: Date of Service: Peter [name] 03/25/2020 2:45 PM Medical Record Number: [id number] Patient Account Number: [id number] Date of Birth/Sex: Treating RN: [birth year] (59 y.o. Janyth Contes Primary Care Johnisha Louks: Raelene Bott Other  Clinician: Referring Zailyn Rowser: Treating Yukie Bergeron/Extender: Worthy Keeler CHO I, JENNIFER Weeks in Treatment: 0 Fall Risk Assessment Items Have you had 2 or more falls in the last 12 monthso 0 No Have you had any fall that resulted in injury in the last 12 monthso 0 No FALLS RISK SCREEN History of falling - immediate or within 3 months 0 No Secondary diagnosis (Do you have 2 or more medical diagnoseso) 15 Yes Ambulatory aid None/bed rest/wheelchair/nurse 0 No Crutches/cane/walker 15 Yes Furniture 0 No Intravenous therapy Access/Saline/Heparin Lock 0 No Gait/Transferring Normal/ bed rest/ wheelchair 0 Yes Weak (  short steps with or without shuffle, stooped but able to lift head while walking, may seek 0 No support from furniture) Impaired (short steps with shuffle, may have difficulty arising from chair, head down, impaired 0 No balance) Mental Status Oriented to own ability 0 Yes Electronic Signature(s) Signed: 03/27/2020 5:41:32 PM By: Levan Hurst RN, BSN Entered By: Levan Hurst on 03/25/2020 15:39:24 -------------------------------------------------------------------------------- Nutrition Risk Screening Details Patient Name: Date of Service: Peter [name] 03/25/2020 2:45 PM Medical Record Number: [id number] Patient Account Number: [id number] Date of Birth/Sex: Treating RN: 1960-10-16 (59 y.o. Janyth Contes Primary Care Helga Asbury: Raelene Bott Other Clinician: Referring Eveline Sauve: Treating Maylon Sailors/Extender: Melburn Hake, Hoyt CHO I, JENNIFER Weeks in Treatment: 0 Height (in): 70 Weight (lbs): 148 Body Mass Index (BMI): 21.2 Nutrition Risk Screening Items Score Screening NUTRITION RISK SCREEN: I have an illness or condition that made me change the kind and/or amount of food I eat 0 No I eat fewer than two meals per day 0 No I eat few fruits and vegetables, or milk products 0 No I have three or more drinks of beer, liquor or wine almost every day 0 No I  have tooth or mouth problems that make it hard for me to eat 0 No I don't always have enough money to buy the food I need 0 No I eat alone most of the time 0 No I take three or more different prescribed or over-the-counter drugs a day 1 Yes Without wanting to, I have lost or gained 10 pounds in the last six months 0 No I am not always physically able to shop, cook and/or feed myself 2 Yes Nutrition Protocols Good Risk Protocol Moderate Risk Protocol 0 Provide education on nutrition High Risk Proctocol Risk Level: Moderate Risk Score: 3 Electronic Signature(s) Signed: 03/27/2020 5:41:32 PM By: Levan Hurst RN, BSN Entered By: Levan Hurst on 03/25/2020 15:40:25

## 2020-03-27 NOTE — Progress Notes (Signed)
[name], Peter ([id number]) Visit Report for 03/25/2020 Chief Complaint Document Details Patient Name: Date of Service: Peter [name], Peter [name] 03/25/2020 2:45 PM Medical Record Number: [id number] Patient Account Number: [id number] Date of Birth/Sex: Treating RN: 1961/01/11 (59 y.o. [name] Primary Care Provider: Raelene [name] Other Clinician: Referring Provider: Treating Provider/Extender: Peter [name] CHO I, JENNIFER Weeks in Treatment: 0 Information Obtained from: Patient Chief Complaint Bilateral Buttock Ulcers/Abscesses Electronic Signature(s) Signed: 03/25/2020 4:12:35 PM By: Peter Keeler PA-C Entered By: Peter [name] on 03/25/2020 16:12:35 -------------------------------------------------------------------------------- HPI Details Patient Name: Date of Service: Peter [name] 03/25/2020 2:45 PM Medical Record Number: [id number] Patient Account Number: [id number] Date of Birth/Sex: Treating RN: 04/15/1961 (59 y.o. [name] Primary Care Provider: Raelene [name] Other Clinician: Referring Provider: Treating Provider/Extender: Peter [name] CHO I, JENNIFER Weeks in Treatment: 0 History of Present Illness HPI Description: 03/25/2020 upon evaluation today patient presents for initial inspection here in our clinic concerning issues that he has been having with abscesses/ulcerations on the gluteal region bilaterally. Currently his wounds are actually doing significantly better which is great news and overall very pleased in that regard. With that being said he has been using wet-to-dry gauze packing to these areas as directed from the hospital. The patient states that his pain is dramatically improved compared to where he was which is great news. He does have a history of hypertension as well as chronic kidney disease stage IV he is seen with his sister in the office here today. The patient was given a course of antibiotic therapy which included IV  daptomycin initially and once that was completed he completed a course of doxycycline for 2 weeks which is done at this point. This was secondary to the MRSA that was identified after the incision and drainage August 13. Subsequently the patient's hemoglobin A1c was 7.4 on August 2021. Electronic Signature(s) Signed: 03/25/2020 5:56:55 PM By: Peter Keeler PA-C Entered By: Peter [name] on 03/25/2020 17:56:54 -------------------------------------------------------------------------------- Physical Exam Details Patient Name: Date of Service: Peter [name], Peter [name] 03/25/2020 2:45 PM Medical Record Number: [id number] Patient Account Number: [id number] Date of Birth/Sex: Treating RN: [birth year] (59 y.o. [name] Primary Care Provider: Other Clinician: Raelene [name] Referring Provider: Treating Provider/Extender: Peter [name] CHO I, JENNIFER Weeks in Treatment: 0 Constitutional patient is hypertensive.. pulse regular and within target range for patient.Marland Kitchen respirations regular, non-labored and within target range for patient.Marland Kitchen temperature within target range for patient.. Well-nourished and well-hydrated in no acute distress. Eyes conjunctiva clear no eyelid edema noted. pupils equal round and reactive to light and accommodation. Ears, Nose, Mouth, and Throat no gross abnormality of ear auricles or external auditory canals. normal hearing noted during conversation. mucus membranes moist. Respiratory normal breathing without difficulty. Cardiovascular 2+ dorsalis pedis/posterior tibialis pulses. no clubbing, cyanosis, significant edema, <3 sec cap refill. Musculoskeletal normal gait and posture. no significant deformity or arthritic changes, no loss or range of motion, no clubbing. Psychiatric this patient is able to make decisions and demonstrates good insight into disease process. Alert and Oriented x 3. pleasant and cooperative. Notes Upon inspection patient's wounds actually  are showing signs of excellent improvement pretty much everything on the right is pretty much healed. On the left this is still a little bit larger but seems to be healing quite nicely. There is no signs of active infection at this time. No fevers, chills, nausea, vomiting, or diarrhea. I believe he may do well with an alginate dressing  for this area Electronic Signature(s) Signed: 03/25/2020 5:58:41 PM By: Peter Keeler PA-C Entered By: Peter [name] on 03/25/2020 17:58:41 -------------------------------------------------------------------------------- Physician Orders Details Patient Name: Date of Service: Peter [name] 03/25/2020 2:45 PM Medical Record Number: [id number] Patient Account Number: [id number] Date of Birth/Sex: Treating RN: Mar 01, 1961 (59 y.o. [name] Primary Care Provider: Raelene [name] Other Clinician: Referring Provider: Treating Provider/Extender: Peter [name] CHO I, JENNIFER Weeks in Treatment: 0 Verbal / Phone Orders: No Diagnosis Coding ICD-10 Coding Code Description L02.31 Cutaneous abscess of buttock L98.412 Non-pressure chronic ulcer of buttock with fat layer exposed I10 Essential (primary) hypertension N18.4 Chronic kidney disease, stage 4 (severe) Follow-up Appointments Return Appointment in 2 weeks. Dressing Change Frequency Wound #1 Left Gluteal fold Change Dressing every other day. Wound #2 Right Gluteus Change Dressing every other day. Wound Cleansing Wound #1 Left Gluteal fold May shower and wash wound with soap and water. - on days when dressing is changed Wound #2 Right Gluteus May shower and wash wound with soap and water. - on days when dressing is changed Primary Wound Dressing Wound #1 Left Gluteal fold Calcium Alginate with Silver Wound #2 Right Gluteus Calcium Alginate with Silver Secondary Dressing Wound #1 Left Gluteal fold Foam Border - or large bandaid Wound #2 Right Gluteus Foam Border - or large  bandaid Electronic Signature(s) Signed: 03/25/2020 6:14:29 PM By: Peter Gouty RN, BSN Signed: 03/25/2020 6:38:49 PM By: Peter Keeler PA-C Entered By: Peter [name] on 03/25/2020 16:25:37 -------------------------------------------------------------------------------- Problem List Details Patient Name: Date of Service: Peter [name] 03/25/2020 2:45 PM Medical Record Number: [id number] Patient Account Number: [id number] Date of Birth/Sex: Treating RN: October 14, 1960 (59 y.o. [name] Primary Care Provider: Raelene [name] Other Clinician: Referring Provider: Treating Provider/Extender: Peter [name] CHO I, JENNIFER Weeks in Treatment: 0 Active Problems ICD-10 Encounter Code Description Active Date MDM Diagnosis L02.31 Cutaneous abscess of buttock 03/25/2020 No Yes L98.412 Non-pressure chronic ulcer of buttock with fat layer exposed 03/25/2020 No Yes I10 Essential (primary) hypertension 03/25/2020 No Yes N18.4 Chronic kidney disease, stage 4 (severe) 03/25/2020 No Yes Inactive Problems Resolved Problems Electronic Signature(s) Signed: 03/25/2020 4:11:28 PM By: Peter Keeler PA-C Entered By: Peter [name] on 03/25/2020 16:11:27 -------------------------------------------------------------------------------- Progress Note Details Patient Name: Date of Service: Peter [name] 03/25/2020 2:45 PM Medical Record Number: [id number] Patient Account Number: [id number] Date of Birth/Sex: Treating RN: [birth year] (59 y.o. [name] Primary Care Provider: Raelene [name] Other Clinician: Referring Provider: Treating Provider/Extender: Peter [name] CHO I, JENNIFER Weeks in Treatment: 0 Subjective Chief Complaint Information obtained from Patient Bilateral Buttock Ulcers/Abscesses History of Present Illness (HPI) 03/25/2020 upon evaluation today patient presents for initial inspection here in our clinic concerning issues that he has been having with  abscesses/ulcerations on the gluteal region bilaterally. Currently his wounds are actually doing significantly better which is great news and overall very pleased in that regard. With that being said he has been using wet-to-dry gauze packing to these areas as directed from the hospital. The patient states that his pain is dramatically improved compared to where he was which is great news. He does have a history of hypertension as well as chronic kidney disease stage IV he is seen with his sister in the office here today. The patient was given a course of antibiotic therapy which included IV daptomycin initially and once that was completed he completed a course of doxycycline for 2 weeks which is done at this point.  This was secondary to the MRSA that was identified after the incision and drainage August 13. Subsequently the patient's hemoglobin A1c was 7.4 on August 2021. Patient History Information obtained from Patient. Allergies No Known Drug Allergies (Severity: Moderate) Family History Cancer - Siblings, Diabetes - Paternal Grandparents,Mother, Heart Disease - Mother, Hypertension - Mother, Kidney Disease - Mother, Stroke - Paternal Grandparents, No family history of Hereditary Spherocytosis, Lung Disease, Seizures, Thyroid Problems, Tuberculosis. Social History Never smoker, Marital Status - Married, Alcohol Use - Never, Drug Use - No History, Caffeine Use - Rarely. Medical History Hematologic/Lymphatic Patient has history of Anemia Respiratory Patient has history of Pneumothorax - 2018 Cardiovascular Patient has history of Hypertension Endocrine Patient has history of Type II Diabetes Patient is treated with Oral Agents. Medical A Surgical History Notes nd Genitourinary stage 4 CKD Review of Systems (ROS) Constitutional Symptoms (General Health) Denies complaints or symptoms of Fatigue, Fever, Chills, Marked Weight Change. Eyes Denies complaints or symptoms of Dry Eyes,  Vision Changes, Glasses / Contacts. Ear/Nose/Mouth/Throat Denies complaints or symptoms of Chronic sinus problems or rhinitis. Gastrointestinal Denies complaints or symptoms of Frequent diarrhea, Nausea, Vomiting. Endocrine Denies complaints or symptoms of Heat/cold intolerance. Integumentary (Skin) Complains or has symptoms of Wounds. Musculoskeletal Denies complaints or symptoms of Muscle Pain, Muscle Weakness. Psychiatric Denies complaints or symptoms of Claustrophobia, Suicidal. Objective Constitutional patient is hypertensive.. pulse regular and within target range for patient.Marland Kitchen respirations regular, non-labored and within target range for patient.Marland Kitchen temperature within target range for patient.. Well-nourished and well-hydrated in no acute distress. Vitals Time Taken: 3:27 PM, Height: 70 in, Source: Stated, Weight: 148 lbs, Source: Stated, BMI: 21.2, Temperature: 98.4 F, Pulse: 90 bpm, Respiratory Rate: 16 breaths/min, Blood Pressure: 146/80 mmHg, Capillary Blood Glucose: 130 mg/dl. General Notes: glucose per pt report Eyes conjunctiva clear no eyelid edema noted. pupils equal round and reactive to light and accommodation. Ears, Nose, Mouth, and Throat no gross abnormality of ear auricles or external auditory canals. normal hearing noted during conversation. mucus membranes moist. Respiratory normal breathing without difficulty. Cardiovascular 2+ dorsalis pedis/posterior tibialis pulses. no clubbing, cyanosis, significant edema, Musculoskeletal normal gait and posture. no significant deformity or arthritic changes, no loss or range of motion, no clubbing. Psychiatric this patient is able to make decisions and demonstrates good insight into disease process. Alert and Oriented x 3. pleasant and cooperative. General Notes: Upon inspection patient's wounds actually are showing signs of excellent improvement pretty much everything on the right is pretty much healed. On the left this  is still a little bit larger but seems to be healing quite nicely. There is no signs of active infection at this time. No fevers, chills, nausea, vomiting, or diarrhea. I believe he may do well with an alginate dressing for this area Integumentary (Hair, Skin) Wound #1 status is Open. Original cause of wound was Bump. The wound is located on the Left Gluteal fold. The wound measures 3.5cm length x 0.5cm width x 0.3cm depth; 1.374cm^2 area and 0.412cm^3 volume. There is Fat Layer (Subcutaneous Tissue) exposed. There is no tunneling or undermining noted. There is a medium amount of serosanguineous drainage noted. The wound margin is flat and intact. There is large (67-100%) pink granulation within the wound bed. There is no necrotic tissue within the wound bed. Wound #2 status is Open. Original cause of wound was Bump. The wound is located on the Right Gluteus. The wound measures 0.5cm length x 0.2cm width x 0.1cm depth; 0.079cm^2 area and 0.008cm^3 volume. There is  Fat Layer (Subcutaneous Tissue) exposed. There is no tunneling or undermining noted. There is a small amount of serosanguineous drainage noted. The wound margin is flat and intact. There is large (67-100%) pink granulation within the wound bed. There is no necrotic tissue within the wound bed. Assessment Active Problems ICD-10 Cutaneous abscess of buttock Non-pressure chronic ulcer of buttock with fat layer exposed Essential (primary) hypertension Chronic kidney disease, stage 4 (severe) Plan Follow-up Appointments: Return Appointment in 2 weeks. Dressing Change Frequency: Wound #1 Left Gluteal fold: Change Dressing every other day. Wound #2 Right Gluteus: Change Dressing every other day. Wound Cleansing: Wound #1 Left Gluteal fold: May shower and wash wound with soap and water. - on days when dressing is changed Wound #2 Right Gluteus: May shower and wash wound with soap and water. - on days when dressing is changed Primary  Wound Dressing: Wound #1 Left Gluteal fold: Calcium Alginate with Silver Wound #2 Right Gluteus: Calcium Alginate with Silver Secondary Dressing: Wound #1 Left Gluteal fold: Foam Border - or large bandaid Wound #2 Right Gluteus: Foam Border - or large bandaid 1. I Georgina Peer suggest currently that we go ahead and initiate treatment with a continuation of dressing changes regularly I would recommend that we switch however to a silver alginate dressing I think the areas on the right would likely be healed fairly quickly. 2. With regard to the left gluteal area we continue to make sure that the dressings in touch with the base of the wound but otherwise I think the alginate is going to do quite well here. 3. I am also can recommend at this time that the patient change the dressings every other day obviously sooner can be done if needed. We will see patient back for reevaluation in 2 weeks here in the clinic. If anything worsens or changes patient will contact our office for additional recommendations. Electronic Signature(s) Signed: 03/25/2020 5:59:32 PM By: Peter Keeler PA-C Entered By: Peter [name] on 03/25/2020 17:59:32 -------------------------------------------------------------------------------- HxROS Details Patient Name: Date of Service: Peter [name] 03/25/2020 2:45 PM Medical Record Number: [id number] Patient Account Number: [id number] Date of Birth/Sex: Treating RN: 04/30/1961 (59 y.o. Janyth Contes Primary Care Provider: Raelene [name] Other Clinician: Referring Provider: Treating Provider/Extender: Peter [name] CHO I, JENNIFER Weeks in Treatment: 0 Information Obtained From Patient Constitutional Symptoms (General Health) Complaints and Symptoms: Negative for: Fatigue; Fever; Chills; Marked Weight Change Eyes Complaints and Symptoms: Negative for: Dry Eyes; Vision Changes; Glasses / Contacts Ear/Nose/Mouth/Throat Complaints and Symptoms: Negative for:  Chronic sinus problems or rhinitis Gastrointestinal Complaints and Symptoms: Negative for: Frequent diarrhea; Nausea; Vomiting Endocrine Complaints and Symptoms: Negative for: Heat/cold intolerance Medical History: Positive for: Type II Diabetes Time with diabetes: since 17 year Treated with: Oral agents Integumentary (Skin) Complaints and Symptoms: Positive for: Wounds Musculoskeletal Complaints and Symptoms: Negative for: Muscle Pain; Muscle Weakness Psychiatric Complaints and Symptoms: Negative for: Claustrophobia; Suicidal Hematologic/Lymphatic Medical History: Positive for: Anemia Respiratory Medical History: Positive for: Pneumothorax - 2018 Cardiovascular Medical History: Positive for: Hypertension Genitourinary Medical History: Past Medical History Notes: stage 4 CKD Immunological Neurologic Oncologic Immunizations Pneumococcal Vaccine: Received Pneumococcal Vaccination: Yes Implantable Devices None Family and Social History Cancer: Yes - Siblings; Diabetes: Yes - Paternal Grandparents,Mother; Heart Disease: Yes - Mother; Hereditary Spherocytosis: No; Hypertension: Yes - Mother; Kidney Disease: Yes - Mother; Lung Disease: No; Seizures: No; Stroke: Yes - Paternal Grandparents; Thyroid Problems: No; Tuberculosis: No; Never smoker; Marital Status - Married; Alcohol Use: Never; Drug  Use: No History; Caffeine Use: Rarely; Financial Concerns: No; Food, Clothing or Shelter Needs: No; Support System Lacking: No; Transportation Concerns: No Electronic Signature(s) Signed: 03/25/2020 6:38:49 PM By: Peter Keeler PA-C Signed: 03/27/2020 5:41:32 PM By: Levan Hurst RN, BSN Entered By: Levan Hurst on 03/25/2020 15:37:16 -------------------------------------------------------------------------------- [place] Details Patient Name: Date of Service: Peter [name] 03/25/2020 Medical Record Number: [id number] Patient Account Number: [id number] Date of  Birth/Sex: Treating RN: [birth year] (59 y.o. [name] Primary Care Provider: Raelene [name] Other Clinician: Referring Provider: Treating Provider/Extender: Peter [name] CHO I, JENNIFER Weeks in Treatment: 0 Diagnosis Coding ICD-10 Codes Code Description L02.31 Cutaneous abscess of buttock L98.412 Non-pressure chronic ulcer of buttock with fat layer exposed I10 Essential (primary) hypertension N18.4 Chronic kidney disease, stage 4 (severe) Facility Procedures CPT4 Code: [id number] Description: 99214 - WOUND CARE VISIT-LEV 4 EST PT Modifier: Quantity: 1 Physician Procedures : CPT4 Code Description Modifier [id number] 10175 - WC PHYS LEVEL 3 - EST PT ICD-10 Diagnosis Description L02.31 Cutaneous abscess of buttock L98.412 Non-pressure chronic ulcer of buttock with fat layer exposed I10 Essential (primary) hypertension N18.4  Chronic kidney disease, stage 4 (severe) Quantity: 1 Electronic Signature(s) Signed: 03/25/2020 5:59:41 PM By: Peter Keeler PA-C Entered By: Peter [name] on 03/25/2020 17:59:40

## 2020-04-08 ENCOUNTER — Other Ambulatory Visit: Payer: Self-pay

## 2020-04-08 ENCOUNTER — Encounter (HOSPITAL_BASED_OUTPATIENT_CLINIC_OR_DEPARTMENT_OTHER): Payer: BC Managed Care – PPO | Admitting: Physician Assistant

## 2020-04-08 DIAGNOSIS — L0231 Cutaneous abscess of buttock: Secondary | ICD-10-CM | POA: Diagnosis not present

## 2020-04-08 NOTE — Progress Notes (Addendum)
Peter, [name] ([id number]) Visit Report for 04/08/2020 Chief Complaint Document Details Patient Name: Date of Service: AUBURN, HERT 04/08/2020 2:00 PM Medical Record Number: [id number] Patient Account Number: [id number] Date of Birth/Sex: Treating RN: 01-31-1961 (59 y.o. Peter [name] Primary Care Provider: Raelene Bott Other Clinician: Referring Provider: Treating Provider/Extender: Gerhard Perches in Treatment: 2 Information Obtained from: Patient Chief Complaint Bilateral Buttock Ulcers/Abscesses Electronic Signature(s) Signed: 04/08/2020 2:12:05 PM By: Worthy Keeler PA-C Entered By: Worthy Keeler on 04/08/2020 14:12:05 -------------------------------------------------------------------------------- HPI Details Patient Name: Date of Service: Peter [name]. 04/08/2020 2:00 PM Medical Record Number: [id number] Patient Account Number: [id number] Date of Birth/Sex: Treating RN: Aug 13, 1960 (59 y.o. Peter [name] Primary Care Provider: Raelene Bott Other Clinician: Referring Provider: Treating Provider/Extender: Gerhard Perches in Treatment: 2 History of Present Illness HPI Description: 03/25/2020 upon evaluation today patient presents for initial inspection here in our clinic concerning issues that he has been having with abscesses/ulcerations on the gluteal region bilaterally. Currently his wounds are actually doing significantly better which is great news and overall very pleased in that regard. With that being said he has been using wet-to-dry gauze packing to these areas as directed from the hospital. The patient states that his pain is dramatically improved compared to where he was which is great news. He does have a history of hypertension as well as chronic kidney disease stage IV he is seen with his sister in the office here today. The patient was given a course of antibiotic therapy which included IV  daptomycin initially and once that was completed he completed a course of doxycycline for 2 weeks which is done at this point. This was secondary to the MRSA that was identified after the incision and drainage August 13. Subsequently the patient's hemoglobin A1c was 7.4 on August 2021. 04/08/2020 upon evaluation today patient appears to be doing extremely well with regard to his abscess ulcers on the gluteal region. Nothing appears to be open upon evaluation today and this is great news. Fortunately there is no signs of infection either which is also good news. His recent hemoglobin A1c was 6.2 that he just had checked. Electronic Signature(s) Signed: 04/08/2020 2:55:57 PM By: Worthy Keeler PA-C Entered By: Worthy Keeler on 04/08/2020 14:55:56 -------------------------------------------------------------------------------- Physical Exam Details Patient Name: Date of Service: Peter, [name] 04/08/2020 2:00 PM Medical Record Number: [id number] Patient Account Number: [id number] Date of Birth/Sex: Treating RN: 11/17/1960 (59 y.o. Peter [name] Primary Care Provider: Raelene Bott Other Clinician: Referring Provider: Treating Provider/Extender: Boykin Nearing Weeks in Treatment: 2 Constitutional Well-nourished and well-hydrated in no acute distress. Respiratory normal breathing without difficulty. Psychiatric this patient is able to make decisions and demonstrates good insight into disease process. Alert and Oriented x 3. pleasant and cooperative. Notes Upon inspection patient's wounds all showed signs of complete epithelization there is no open wounds at this point and overall he seems to be doing quite well which is great news. No fevers, chills, nausea, vomiting, or diarrhea. Electronic Signature(s) Signed: 04/08/2020 2:56:11 PM By: Worthy Keeler PA-C Entered By: Worthy Keeler on 04/08/2020  14:56:10 -------------------------------------------------------------------------------- Physician Orders Details Patient Name: Date of Service: Peter [name] 04/08/2020 2:00 PM Medical Record Number: [id number] Patient Account Number: [id number] Date of Birth/Sex: Treating RN: [birth year] (59 y.o. Peter [name] Primary Care Provider: Raelene Bott Other Clinician: Referring Provider: Treating Provider/Extender: Varney Baas, Maeola Harman  in Treatment: 2 Verbal / Phone Orders: No Diagnosis Coding ICD-10 Coding Code Description L02.31 Cutaneous abscess of buttock L98.412 Non-pressure chronic ulcer of buttock with fat layer exposed I10 Essential (primary) hypertension N18.4 Chronic kidney disease, stage 4 (severe) Discharge From [facility] Services Discharge from [place] Skin Barriers/Peri-Wound Care Other: - may use vaseline to protect perianal skin as needed Wound Cleansing May shower and wash wound with soap and water. Electronic Signature(s) Signed: 04/08/2020 4:35:17 PM By: Worthy Keeler PA-C Signed: 04/08/2020 4:55:29 PM By: Baruch Gouty RN, BSN Entered By: Baruch Gouty on 04/08/2020 14:54:40 -------------------------------------------------------------------------------- Problem List Details Patient Name: Date of Service: Peter [name] 04/08/2020 2:00 PM Medical Record Number: [id number] Patient Account Number: [id number] Date of Birth/Sex: Treating RN: 11-08-1960 (59 y.o. Peter [name] Primary Care Provider: Raelene Bott Other Clinician: Referring Provider: Treating Provider/Extender: Boykin Nearing Weeks in Treatment: 2 Active Problems ICD-10 Encounter Code Description Active Date MDM Diagnosis L02.31 Cutaneous abscess of buttock 03/25/2020 No Yes L98.412 Non-pressure chronic ulcer of buttock with fat layer exposed 03/25/2020 No Yes I10 Essential (primary) hypertension 03/25/2020 No Yes N18.4 Chronic kidney  disease, stage 4 (severe) 03/25/2020 No Yes Inactive Problems Resolved Problems Electronic Signature(s) Signed: 04/08/2020 2:11:52 PM By: Worthy Keeler PA-C Entered By: Worthy Keeler on 04/08/2020 14:11:52 -------------------------------------------------------------------------------- Progress Note Details Patient Name: Date of Service: Peter [name]. 04/08/2020 2:00 PM Medical Record Number: [id number] Patient Account Number: [id number] Date of Birth/Sex: Treating RN: April 09, 1961 (59 y.o. Peter [name] Primary Care Provider: Raelene Bott Other Clinician: Referring Provider: Treating Provider/Extender: Gerhard Perches in Treatment: 2 Subjective Chief Complaint Information obtained from Patient Bilateral Buttock Ulcers/Abscesses History of Present Illness (HPI) 03/25/2020 upon evaluation today patient presents for initial inspection here in our clinic concerning issues that he has been having with abscesses/ulcerations on the gluteal region bilaterally. Currently his wounds are actually doing significantly better which is great news and overall very pleased in that regard. With that being said he has been using wet-to-dry gauze packing to these areas as directed from the hospital. The patient states that his pain is dramatically improved compared to where he was which is great news. He does have a history of hypertension as well as chronic kidney disease stage IV he is seen with his sister in the office here today. The patient was given a course of antibiotic therapy which included IV daptomycin initially and once that was completed he completed a course of doxycycline for 2 weeks which is done at this point. This was secondary to the MRSA that was identified after the incision and drainage August 13. Subsequently the patient's hemoglobin A1c was 7.4 on August 2021. 04/08/2020 upon evaluation today patient appears to be doing extremely well with regard to  his abscess ulcers on the gluteal region. Nothing appears to be open upon evaluation today and this is great news. Fortunately there is no signs of infection either which is also good news. His recent hemoglobin A1c was 6.2 that he just had checked. Objective Constitutional Well-nourished and well-hydrated in no acute distress. Vitals Time Taken: 2:15 PM, Height: 70 in, Weight: 148 lbs, BMI: 21.2, Temperature: 98.0 F, Pulse: 77 bpm, Respiratory Rate: 16 breaths/min, Blood Pressure: 146/81 mmHg, Capillary Blood Glucose: 125 mg/dl. Respiratory normal breathing without difficulty. Psychiatric this patient is able to make decisions and demonstrates good insight into disease process. Alert and Oriented x 3. pleasant and cooperative. General Notes: Upon inspection patient's  wounds all showed signs of complete epithelization there is no open wounds at this point and overall he seems to be doing quite well which is great news. No fevers, chills, nausea, vomiting, or diarrhea. Integumentary (Hair, Skin) Wound #1 status is Open. Original cause of wound was Bump. The wound is located on the Left Gluteal fold. The wound measures 0cm length x 0cm width x 0cm depth; 0cm^2 area and 0cm^3 volume. Wound #2 status is Open. Original cause of wound was Bump. The wound is located on the Right Gluteus. The wound measures 0cm length x 0cm width x 0cm depth; 0cm^2 area and 0cm^3 volume. Assessment Active Problems ICD-10 Cutaneous abscess of buttock Non-pressure chronic ulcer of buttock with fat layer exposed Essential (primary) hypertension Chronic kidney disease, stage 4 (severe) Plan Discharge From [facility] Services: Discharge from [place] Skin Barriers/Peri-Wound Care: Other: - may use vaseline to protect perianal skin as needed Wound Cleansing: May shower and wash wound with soap and water. 1. I would recommend at this time that we continue with the wound care measures with regard to offloading  and protection he does not need any bandages at this point but I would recommend something such as Vaseline or even a little bit antibiotic ointment at the top part where he has a small scratched area to protect but other than that I do not believe he is getting need any protective bandages at this time. 2. With regard to the offloading I do not think he has to be extremely careful but I would avoid sitting for long periods of time. We will see the patient back for follow-up visit as needed. Electronic Signature(s) Signed: 04/08/2020 2:56:46 PM By: Worthy Keeler PA-C Entered By: Worthy Keeler on 04/08/2020 14:56:46 -------------------------------------------------------------------------------- SuperBill Details Patient Name: Date of Service: Peter [name] 04/08/2020 Medical Record Number: [id number] Patient Account Number: [id number] Date of Birth/Sex: Treating RN: 03/16/1961 (59 y.o. Peter [name] Primary Care Provider: Raelene Bott Other Clinician: Referring Provider: Treating Provider/Extender: Gerhard Perches in Treatment: 2 Diagnosis Coding ICD-10 Codes Code Description L02.31 Cutaneous abscess of buttock L98.412 Non-pressure chronic ulcer of buttock with fat layer exposed I10 Essential (primary) hypertension N18.4 Chronic kidney disease, stage 4 (severe) Facility Procedures CPT4 Code: [id number] Description: [phone] - WOUND CARE VISIT-LEV 2 EST PT Modifier: Quantity: 1 Physician Procedures : CPT4 Code Description Modifier [id number] - WC PHYS LEVEL 3 - EST PT ICD-10 Diagnosis Description L02.31 Cutaneous abscess of buttock L98.412 Non-pressure chronic ulcer of buttock with fat layer exposed I10 Essential (primary) hypertension N18.4  Chronic kidney disease, stage 4 (severe) Quantity: 1 Electronic Signature(s) Signed: 04/08/2020 2:56:58 PM By: Worthy Keeler PA-C Entered By: Worthy Keeler on 04/08/2020 14:56:57

## 2020-04-09 NOTE — Progress Notes (Signed)
Peter, [name] ([id number]) Visit Report for 04/08/2020 Arrival Information Details Patient Name: Date of Service: Peter [name], Peter [name] 04/08/2020 2:00 PM Medical Record Number: [id number] Patient Account Number: [id number] Date of Birth/Sex: Treating RN: 1961/01/01 (59 y.o. Ulyses Amor, Vaughan Basta Primary Care Shonika Kolasinski: Raelene Bott Other Clinician: Referring Benino Korinek: Treating Delwyn Scoggin/Extender: Gerhard Perches in Treatment: 2 Visit Information History Since Last Visit Added or deleted any medications: No Patient Arrived: Ambulatory Any new allergies or adverse reactions: No Arrival Time: 14:15 Had a fall or experienced change in No Accompanied By: sister activities of daily living that may affect Transfer Assistance: None risk of falls: Patient Identification Verified: Yes Signs or symptoms of abuse/neglect since last visito No Secondary Verification Process Completed: Yes Hospitalized since last visit: No Patient Requires Transmission-Based Precautions: No Implantable device outside of the clinic excluding No Patient Has Alerts: No cellular tissue based products placed in the center since last visit: Has Dressing in Place as Prescribed: Yes Pain Present Now: No Electronic Signature(s) Signed: 04/09/2020 2:18:08 PM By: Sandre Kitty Entered By: Sandre Kitty on 04/08/2020 14:15:49 -------------------------------------------------------------------------------- Clinic Level of Care Assessment Details Patient Name: Date of Service: Peter, [name] 04/08/2020 2:00 PM Medical Record Number: [id number] Patient Account Number: [id number] Date of Birth/Sex: Treating RN: Jan 18, 1961 (59 y.o. [name] Primary Care Lataysha Vohra: Raelene Bott Other Clinician: Referring Charon Smedberg: Treating Azhar Knope/Extender: Gerhard Perches in Treatment: 2 Clinic Level of Care Assessment Items TOOL 4 Quantity Score []  - 0 Use when only an  EandM is performed on FOLLOW-UP visit ASSESSMENTS - Nursing Assessment / Reassessment X- 1 10 Reassessment of Co-morbidities (includes updates in patient status) X- 1 5 Reassessment of Adherence to Treatment Plan ASSESSMENTS - Wound and Skin A ssessment / Reassessment []  - 0 Simple Wound Assessment / Reassessment - one wound X- 2 5 Complex Wound Assessment / Reassessment - multiple wounds []  - 0 Dermatologic / Skin Assessment (not related to wound area) ASSESSMENTS - Focused Assessment []  - 0 Circumferential Edema Measurements - multi extremities []  - 0 Nutritional Assessment / Counseling / Intervention []  - 0 Lower Extremity Assessment (monofilament, tuning fork, pulses) []  - 0 Peripheral Arterial Disease Assessment (using hand held doppler) ASSESSMENTS - Ostomy and/or Continence Assessment and Care []  - 0 Incontinence Assessment and Management []  - 0 Ostomy Care Assessment and Management (repouching, etc.) PROCESS - Coordination of Care X - Simple Patient / Family Education for ongoing care 1 15 []  - 0 Complex (extensive) Patient / Family Education for ongoing care X- 1 10 Staff obtains Consents, Records, T Results / Process Orders est []  - 0 Staff telephones HHA, Nursing Homes / Clarify orders / etc []  - 0 Routine Transfer to another Facility (non-emergent condition) []  - 0 Routine Hospital Admission (non-emergent condition) []  - 0 New Admissions / Biomedical engineer / Ordering NPWT Apligraf, etc. , []  - 0 Emergency Hospital Admission (emergent condition) X- 1 10 Simple Discharge Coordination []  - 0 Complex (extensive) Discharge Coordination PROCESS - Special Needs []  - 0 Pediatric / Minor Patient Management []  - 0 Isolation Patient Management []  - 0 Hearing / Language / Visual special needs []  - 0 Assessment of Community assistance (transportation, D/C planning, etc.) []  - 0 Additional assistance / Altered mentation []  - 0 Support Surface(s)  Assessment (bed, cushion, seat, etc.) INTERVENTIONS - Wound Cleansing / Measurement X - Simple Wound Cleansing - one wound 1 5 []  - 0 Complex Wound Cleansing - multiple wounds X-  1 5 Wound Imaging (photographs - any number of wounds) []  - 0 Wound Tracing (instead of photographs) []  - 0 Simple Wound Measurement - one wound []  - 0 Complex Wound Measurement - multiple wounds INTERVENTIONS - Wound Dressings []  - 0 Small Wound Dressing one or multiple wounds []  - 0 Medium Wound Dressing one or multiple wounds []  - 0 Large Wound Dressing one or multiple wounds []  - 0 Application of Medications - topical []  - 0 Application of Medications - injection INTERVENTIONS - Miscellaneous []  - 0 External ear exam []  - 0 Specimen Collection (cultures, biopsies, blood, body fluids, etc.) []  - 0 Specimen(s) / Culture(s) sent or taken to Lab for analysis []  - 0 Patient Transfer (multiple staff / Civil Service fast streamer / Similar devices) []  - 0 Simple Staple / Suture removal (25 or less) []  - 0 Complex Staple / Suture removal (26 or more) []  - 0 Hypo / Hyperglycemic Management (close monitor of Blood Glucose) []  - 0 Ankle / Brachial Index (ABI) - do not check if billed separately X- 1 5 Vital Signs Has the patient been seen at the hospital within the last three years: Yes Total Score: 75 Level Of Care: New/Established - Level 2 Electronic Signature(s) Signed: 04/08/2020 4:55:29 PM By: Baruch Gouty RN, BSN Entered By: Baruch Gouty on 04/08/2020 14:53:36 -------------------------------------------------------------------------------- Lower Extremity Assessment Details Patient Name: Date of Service: Peter [name]. 04/08/2020 2:00 PM Medical Record Number: [id number] Patient Account Number: [id number] Date of Birth/Sex: Treating RN: 03-07-1961 (59 y.o. Hessie Diener Primary Care Arieh Bogue: Raelene Bott Other Clinician: Referring Cross Jorge: Treating Kingston Shawgo/Extender: Varney Baas, [place] Weeks in Treatment: 2 Electronic Signature(s) Signed: 04/08/2020 4:36:52 PM By: Deon Pilling Entered By: Deon Pilling on 04/08/2020 14:25:08 -------------------------------------------------------------------------------- [place] Details Patient Name: Date of Service: Peter [name] 04/08/2020 2:00 PM Medical Record Number: [id number] Patient Account Number: [id number] Date of Birth/Sex: Treating RN: Oct 19, 1960 (59 y.o. [name] Primary Care Cheston Coury: Raelene Bott Other Clinician: Referring Luchiano Viscomi: Treating Arizbeth Cawthorn/Extender: Gerhard Perches in Treatment: 2 Active Inactive Electronic Signature(s) Signed: 04/08/2020 4:55:29 PM By: Baruch Gouty RN, BSN Entered By: Baruch Gouty on 04/08/2020 14:52:38 -------------------------------------------------------------------------------- Pain Assessment Details Patient Name: Date of Service: Peter [name] 04/08/2020 2:00 PM Medical Record Number: [id number] Patient Account Number: [id number] Date of Birth/Sex: Treating RN: 07/06/1960 (59 y.o. [name] Primary Care Sriya Kroeze: Raelene Bott Other Clinician: Referring Kimmy Parish: Treating Retha Bither/Extender: Boykin Nearing Weeks in Treatment: 2 Active Problems Location of Pain Severity and Description of Pain Patient Has Paino No Site Locations Pain Management and Medication Current Pain Management: Electronic Signature(s) Signed: 04/08/2020 4:55:29 PM By: Baruch Gouty RN, BSN Signed: 04/09/2020 2:18:08 PM By: Sandre Kitty Entered By: Sandre Kitty on 04/08/2020 14:16:42 -------------------------------------------------------------------------------- Patient/Caregiver Education Details Patient Name: Date of Service: Peter [name] 10/20/2021andnbsp2:00 PM Medical Record Number: [id number] Patient Account Number: [id number] Date of Birth/Gender: Treating  RN: 1960/11/01 (59 y.o. [name] Primary Care Physician: Raelene Bott Other Clinician: Referring Physician: Treating Physician/Extender: Gerhard Perches in Treatment: 2 Education Assessment Education Provided To: Patient Education Topics Provided Wound/Skin Impairment: Methods: Explain/Verbal Responses: Reinforcements needed, State content correctly Electronic Signature(s) Signed: 04/08/2020 4:55:29 PM By: Baruch Gouty RN, BSN Entered By: Baruch Gouty on 04/08/2020 14:52:53 -------------------------------------------------------------------------------- Wound Assessment Details Patient Name: Date of Service: Peter [name]. 04/08/2020 2:00 PM Medical Record Number: [id number] Patient Account Number: [id number] Date of Birth/Sex: Treating  RN: [birth year] (59 y.o. [name] Primary Care Kindred Heying: Raelene Bott Other Clinician: Referring Jossalin Chervenak: Treating Callan Yontz/Extender: Boykin Nearing Weeks in Treatment: 2 Wound Status Wound Number: 1 Primary Etiology: Abscess Wound Location: Left Gluteal fold Wound Status: Open Wounding Event: Bump Date Acquired: 01/31/2020 Weeks Of Treatment: 2 Clustered Wound: No Wound Measurements Length: (cm) Width: (cm) Depth: (cm) Area: (cm) Volume: (cm) 0 % Reduction in Area: 100% 0 % Reduction in Volume: 100% 0 0 0 Wound Description Classification: Full Thickness Without Exposed Support Structur es Electronic Signature(s) Signed: 04/08/2020 4:55:29 PM By: Baruch Gouty RN, BSN Signed: 04/09/2020 2:18:08 PM By: Sandre Kitty Entered By: Sandre Kitty on 04/08/2020 14:22:17 -------------------------------------------------------------------------------- Wound Assessment Details Patient Name: Date of Service: Peter [name]. 04/08/2020 2:00 PM Medical Record Number: [id number] Patient Account Number: [id number] Date of Birth/Sex: Treating  RN: 04/22/1961 (59 y.o. [name] Primary Care [place]: Raelene Bott Other Clinician: Referring Esthefany Herrig: Treating Detrice Cales/Extender: Boykin Nearing Weeks in Treatment: 2 Wound Status Wound Number: 2 Primary Etiology: Abscess Wound Location: Right Gluteus Wound Status: Open Wounding Event: Bump Date Acquired: 01/31/2020 Weeks Of Treatment: 2 Clustered Wound: No Wound Measurements Length: (cm) Width: (cm) Depth: (cm) Area: (cm) Volume: (cm) 0 % Reduction in Area: 100% 0 % Reduction in Volume: 100% 0 0 0 Wound Description Classification: Full Thickness Without Exposed Support Structur es Electronic Signature(s) Signed: 04/08/2020 4:55:29 PM By: Baruch Gouty RN, BSN Signed: 04/09/2020 2:18:08 PM By: Sandre Kitty Entered By: Sandre Kitty on 04/08/2020 14:22:17 -------------------------------------------------------------------------------- [place] Details Patient Name: Date of Service: Peter [name]. 04/08/2020 2:00 PM Medical Record Number: [id number] Patient Account Number: [id number] Date of Birth/Sex: Treating RN: 15-Sep-1960 (59 y.o. [name] Primary Care Garwood Wentzell: Raelene Bott Other Clinician: Referring Lexia Vandevender: Treating Jaeven Wanzer/Extender: Gerhard Perches in Treatment: 2 Vital Signs Time Taken: 14:15 Temperature (F): 98.0 Height (in): 70 Pulse (bpm): 77 Weight (lbs): 148 Respiratory Rate (breaths/min): 16 Body Mass Index (BMI): 21.2 Blood Pressure (mmHg): 146/81 Capillary Blood Glucose (mg/dl): 125 Reference Range: 80 - 120 mg / dl Electronic Signature(s) Signed: 04/09/2020 2:18:08 PM By: Sandre Kitty Entered By: Sandre Kitty on 04/08/2020 14:16:36

## 2020-08-13 ENCOUNTER — Other Ambulatory Visit (HOSPITAL_COMMUNITY): Payer: Self-pay | Admitting: Internal Medicine

## 2020-08-13 DIAGNOSIS — Z7682 Awaiting organ transplant status: Secondary | ICD-10-CM

## 2020-08-19 ENCOUNTER — Encounter (HOSPITAL_COMMUNITY): Payer: Self-pay

## 2020-08-21 ENCOUNTER — Encounter (HOSPITAL_COMMUNITY): Payer: Self-pay | Admitting: Internal Medicine

## 2020-08-24 ENCOUNTER — Other Ambulatory Visit (HOSPITAL_COMMUNITY): Payer: BC Managed Care – PPO

## 2020-08-26 ENCOUNTER — Other Ambulatory Visit (HOSPITAL_COMMUNITY): Payer: BC Managed Care – PPO

## 2021-08-05 ENCOUNTER — Other Ambulatory Visit (HOSPITAL_COMMUNITY): Payer: Self-pay | Admitting: Internal Medicine

## 2021-08-05 DIAGNOSIS — Z01810 Encounter for preprocedural cardiovascular examination: Secondary | ICD-10-CM

## 2021-08-09 ENCOUNTER — Other Ambulatory Visit (HOSPITAL_COMMUNITY): Payer: Self-pay | Admitting: Internal Medicine

## 2021-08-09 DIAGNOSIS — Z01810 Encounter for preprocedural cardiovascular examination: Secondary | ICD-10-CM

## 2021-09-13 ENCOUNTER — Ambulatory Visit (HOSPITAL_BASED_OUTPATIENT_CLINIC_OR_DEPARTMENT_OTHER): Payer: BC Managed Care – PPO

## 2021-09-13 ENCOUNTER — Ambulatory Visit (HOSPITAL_COMMUNITY): Payer: BC Managed Care – PPO

## 2021-09-13 ENCOUNTER — Other Ambulatory Visit: Payer: Self-pay

## 2021-09-13 ENCOUNTER — Ambulatory Visit (HOSPITAL_COMMUNITY): Payer: BC Managed Care – PPO | Attending: Cardiology

## 2021-09-13 DIAGNOSIS — Z01810 Encounter for preprocedural cardiovascular examination: Secondary | ICD-10-CM | POA: Insufficient documentation

## 2021-09-13 LAB — ECHOCARDIOGRAM COMPLETE
Area-P 1/2: 3.17 cm2
P 1/2 time: 278 msec
S' Lateral: 3.3 cm

## 2021-09-13 MED ORDER — PERFLUTREN LIPID MICROSPHERE
1.0000 mL | INTRAVENOUS | Status: AC | PRN
  Administered 2021-09-13 (×3): 1 mL via INTRAVENOUS

## 2021-09-30 ENCOUNTER — Ambulatory Visit: Payer: BC Managed Care – PPO | Admitting: Internal Medicine

## 2021-10-14 NOTE — Progress Notes (Signed)
?Cardiology Office Note:   ? ?Date:  10/15/2021  ? ?ID:  Peter [name], DOB [birth year], MRN [id number] ? ?PCP:  Raelene Bott, MD  ?Cardiologist:  None  ?Electrophysiologist:  None  ? ?Referring MD: Reesa Chew, MD  ? ?Chief Complaint  ?Patient presents with  ? Pre-op Exam  ? ? ?History of Present Illness:   ? ?Peter [name] is a 61 y.o. male with a hx of T2DM, hyperlipidemia, CKD stage IV who is referred by Dr. Joylene Grapes for preop evaluation. ? ?Has a history of MRSA bacteremia.  Underwent TEE 02/04/2020 which showed no evidence of endocarditis but was noted to have bicuspid aortic valve.  Echocardiogram 09/13/2021 showed normal biventricular function, grade 1 diastolic dysfunction, bicuspid aortic valve with mild AI, mild dilatation of aortic root measuring 43 mm.  He underwent stress echocardiogram 09/13/2021 which showed poor exercise capacity (5.8 METS), hypokinesis in mid to apical anteroseptal segments that peak stress concerning for ischemia. ? ?He denies any chest pain or dyspnea.  He is active, works as a Psychologist, sport and exercise.  Denies any exertional symptoms.  He can walk up a flight of stairs without any symptoms.  Denies any lightheadedness, syncope, lower extreme edema, or palpitations.  Reports BP has been 140s to 150s when he checks at home. ? ? ?Past Medical History:  ?Diagnosis Date  ? Diabetes mellitus without complication ([place])   ? High cholesterol   ? Multiple closed anterior-posterior compression fractures of pelvis ([place]) 02/08/2017  ? ? ?Past Surgical History:  ?Procedure Laterality Date  ? ESOPHAGOGASTRODUODENOSCOPY N/A 03/01/2017  ? Procedure: ESOPHAGOGASTRODUODENOSCOPY (EGD);  Surgeon: Judeth Horn, MD;  Location: [facility];  Service: General;  Laterality: N/A;  ? EXTERNAL FIXATION PELVIS N/A 02/07/2017  ? Procedure: EXTERNAL FIXATION PELVIS;  Surgeon: Altamese Minto, MD;  Location: [facility];  Service: Orthopedics;  Laterality: N/A;  ? HERNIA REPAIR    ? IR ANGIOGRAM PELVIS SELECTIVE OR SUPRASELECTIVE   02/07/2017  ? IR ANGIOGRAM SELECTIVE EACH ADDITIONAL VESSEL  02/07/2017  ? IR ANGIOGRAM SELECTIVE EACH ADDITIONAL VESSEL  02/07/2017  ? IR ANGIOGRAM VISCERAL SELECTIVE  02/07/2017  ? IR ANGIOGRAM VISCERAL SELECTIVE  02/07/2017  ? IR CATHETER TUBE CHANGE  04/26/2017  ? IR EMBO ART  VEN HEMORR LYMPH EXTRAV  INC GUIDE ROADMAPPING  02/07/2017  ? IR EMBO ART  VEN HEMORR LYMPH EXTRAV  INC GUIDE ROADMAPPING  02/07/2017  ? IR FLUORO GUIDE CV LINE RIGHT  02/05/2020  ? IR FLUORO GUIDED NEEDLE PLC ASPIRATION/INJECTION LOC  03/22/2017  ? IR REMOVAL TUN CV CATH W/O FL  03/05/2020  ? IR US GUIDE VASC ACCESS RIGHT  02/07/2017  ? IR US GUIDE VASC ACCESS RIGHT  02/05/2020  ? IRRIGATION AND DEBRIDEMENT ABSCESS N/A 02/01/2020  ? Procedure: IRRIGATION AND DEBRIDEMENT ABSCESS;  Surgeon: Alphonsa Overall, MD;  Location: [facility];  Service: General;  Laterality: N/A;  ? ORIF PELVIC FRACTURE N/A 02/13/2017  ? Procedure: OPEN REDUCTION INTERNAL FIXATION (ORIF) PELVIC FRACTURE;  Surgeon: Altamese Boyd, MD;  Location: [facility];  Service: Orthopedics;  Laterality: N/A;  ? PEG PLACEMENT N/A 03/01/2017  ? Procedure: PERCUTANEOUS ENDOSCOPIC GASTROSTOMY (PEG) PLACEMENT;  Surgeon: Judeth Horn, MD;  Location: [facility];  Service: General;  Laterality: N/A;  ? PERCUTANEOUS TRACHEOSTOMY N/A 03/01/2017  ? Procedure: PERCUTANEOUS TRACHEOSTOMY AT BEDSIDE;  Surgeon: Judeth Horn, MD;  Location: [facility];  Service: General;  Laterality: N/A;  ? RADIOLOGY WITH ANESTHESIA N/A 02/07/2017  ? Procedure: RADIOLOGY WITH ANESTHESIA;  Surgeon: Greggory Keen, MD;  Location: [facility];  Service: Radiology;  Laterality: N/A;  ? SACROILIAC JOINT FUSION N/A 02/07/2017  ? Procedure: SACROILIAC JOINT FUSION;  Surgeon: Altamese Independence, MD;  Location: [facility];  Service: Orthopedics;  Laterality: N/A;  ? TEE WITHOUT CARDIOVERSION N/A 02/04/2020  ? Procedure: TRANSESOPHAGEAL ECHOCARDIOGRAM (TEE);  Surgeon: Buford Dresser, MD;  Location: [facility];  Service: Cardiovascular;  Laterality: N/A;   ? ? ?Current Medications: ?Current Meds  ?Medication Sig  ? aspirin EC 81 MG EC tablet Take 1 tablet (81 mg total) by mouth daily. Swallow whole.  ? AURYXIA 1 GM 210 MG(Fe) tablet Take 420 mg by mouth 3 (three) times daily.  ? chlorthalidone (HYGROTON) 25 MG tablet Take 1 tablet (25 mg total) by mouth daily.  ? cromolyn (GASTROCROM) 100 MG/5ML solution Take 100 mg by mouth 4 (four) times daily.  ? glimepiride (AMARYL) 2 MG tablet Take 1 tablet (2 mg total) by mouth daily with breakfast. (Patient taking differently: Take 2 mg by mouth in the morning and at bedtime.)  ? sodium bicarbonate 650 MG tablet Take 1,300 mg by mouth 2 (two) times daily.  ? [DISCONTINUED] amLODipine (NORVASC) 5 MG tablet Take 1 tablet (5 mg total) by mouth daily. (Patient taking differently: Take 5 mg by mouth 2 (two) times daily.)  ? [DISCONTINUED] carvedilol (COREG) 6.25 MG tablet Take 6.25 mg by mouth in the morning and at bedtime.  ?  ? ?Allergies:   Patient has no known allergies.  ? ?Social History  ? ?Socioeconomic History  ? Marital status: Married  ?  Spouse name: Not on file  ? Number of children: Not on file  ? Years of education: Not on file  ? Highest education level: Not on file  ?Occupational History  ? Not on file  ?Tobacco Use  ? Smoking status: Never  ? Smokeless tobacco: Never  ?Substance and Sexual Activity  ? Alcohol use: No  ? Drug use: No  ? Sexual activity: Not on file  ?Other Topics Concern  ? Not on file  ?Social History Narrative  ? Not on file  ? ?Social Determinants of Health  ? ?Financial Resource Strain: Not on file  ?Food Insecurity: Not on file  ?Transportation Needs: Not on file  ?Physical Activity: Not on file  ?Stress: Not on file  ?Social Connections: Not on file  ?  ? ?Family History: ?The patient's family history includes Diabetes in his mother; Heart disease in his mother; Renal Disease in his mother. ? ?ROS:   ?Please see the history of present illness.    ? All other systems reviewed and are  negative. ? ?EKGs/Labs/Other Studies Reviewed:   ? ?The following studies were reviewed today: ? ? ?EKG:   ?10/15/2021: Normal sinus rhythm, rate 73, left axis deviation ? ?Recent Labs: ?No results found for requested labs within last 8760 hours.  ?Recent Lipid Panel ?   ?Component Value Date/Time  ? CHOL 144 02/13/2020 0442  ? TRIG 84 02/13/2020 0442  ? HDL 53 02/13/2020 0442  ? CHOLHDL 2.7 02/13/2020 0442  ? VLDL 17 02/13/2020 0442  ? [place] 74 02/13/2020 0442  ? ? ?Physical Exam:   ? ?VS:  BP (!) 150/78   Pulse 73   Ht '5\' 7"'$  (1.702 m)   Wt 167 lb 3.2 oz (75.8 kg)   SpO2 98%   BMI 26.19 kg/m?    ? ?Wt Readings from Last 3 Encounters:  ?10/15/21 167 lb 3.2 oz (75.8 kg)  ?03/16/20 147 lb (66.7  kg)  ?02/14/20 157 lb 13.6 oz (71.6 kg)  ?  ? ?GEN:  Well nourished, well developed in no acute distress ?HEENT: Normal ?NECK: No JVD; No carotid bruits ?LYMPHATICS: No lymphadenopathy ?CARDIAC: RRR, no murmurs, rubs, gallops ?RESPIRATORY:  Clear to auscultation without rales, wheezing or rhonchi  ?ABDOMEN: Soft, non-tender, non-distended ?MUSCULOSKELETAL:  No edema; No deformity  ?SKIN: Warm and dry ?NEUROLOGIC:  Alert and oriented x 3 ?PSYCHIATRIC:  Normal affect  ? ?ASSESSMENT:   ? ?1. Pre-operative cardiovascular examination   ?2. Abnormal stress echocardiogram   ?3. Chronic kidney disease (CKD), stage IV (severe) (H[facility])   ?4. Essential hypertension   ? ?PLAN:   ? ?Preop evaluation: prior to renal transplant.  He underwent stress echocardiogram 09/13/2021 which showed poor exercise capacity (5.8 METS, reports he is limited by dropfoot), hypokinesis in mid to apical anteroseptal segments at peak stress concerning for ischemia. ?-He denies any exertional chest pain or dyspnea.  Good functional capacity.  Recommend stress PET for further evaluation.  If stress PET suggests obstructive CAD, would plan medical management as not a candidate for catheterization until he starts dialysis.  If stress PET suggest no ischemia, no  further work-up recommended at this time ? ?CKD stage IV: Most recent creatinine 5.29 on 08/30/2021.  Follows with nephrology, being evaluated for renal transplant ? ?Hypertension: On chlorthalidone 25 mg daily, Coreg 6.

## 2021-10-15 ENCOUNTER — Ambulatory Visit (INDEPENDENT_AMBULATORY_CARE_PROVIDER_SITE_OTHER): Payer: BC Managed Care – PPO | Admitting: Cardiology

## 2021-10-15 ENCOUNTER — Encounter: Payer: Self-pay | Admitting: Cardiology

## 2021-10-15 VITALS — BP 150/78 | HR 73 | Ht 67.0 in | Wt 167.2 lb

## 2021-10-15 DIAGNOSIS — Z01810 Encounter for preprocedural cardiovascular examination: Secondary | ICD-10-CM

## 2021-10-15 DIAGNOSIS — R9439 Abnormal result of other cardiovascular function study: Secondary | ICD-10-CM | POA: Diagnosis not present

## 2021-10-15 DIAGNOSIS — N184 Chronic kidney disease, stage 4 (severe): Secondary | ICD-10-CM

## 2021-10-15 DIAGNOSIS — I10 Essential (primary) hypertension: Secondary | ICD-10-CM

## 2021-10-15 MED ORDER — AMLODIPINE BESYLATE 5 MG PO TABS: 5.0000 mg | ORAL_TABLET | Freq: Two times a day (BID) | ORAL | 3 refills | Status: DC

## 2021-10-15 MED ORDER — CARVEDILOL 12.5 MG PO TABS: 12.5000 mg | ORAL_TABLET | Freq: Two times a day (BID) | ORAL | 3 refills | Status: DC

## 2021-10-15 NOTE — Patient Instructions (Addendum)
Medication Instructions:  ?INCREASE carvedilol (Coreg) 12.5 mg two times daily ? ?*If you need a refill on your cardiac medications before your next appointment, please call your pharmacy* ? ?Testing/Procedures: ?How to Prepare for Your Cardiac PET/CT Stress Test: ? ?1. Please do not take these medications before your test:  ? ?Medications that may interfere with the cardiac pharmacological stress agent (ex. nitrates or beta-blockers) the day of the exam. ?Theophylline containing medications for 12 hours. ?Dipyridamole 48 hours prior to the test. ?Your remaining medications may be taken with water. ? ?2. Nothing to eat or drink, except water, 3 hours prior to arrival time.   ?NO caffeine/decaffeinated products, or chocolate 12 hours prior to arrival. ? ?3. NO perfume, cologne or lotion ? ?4. Total time is 1 to 2 hours; you may want to bring reading material for the waiting time. ? ?5. Please report to Admitting at the [facility] Entrance 60 minutes early for your test. ? [facility] ? [address], [place] [address] ? ?Diabetic Preparation:  ?Hold oral medications. ?You may take NPH and Lantus insulin. ?Do not take Humalog or Humulin R (Regular Insulin) the day of your test. ?Check blood sugars prior to leaving the house. ?If able to eat breakfast prior to 3 hour fasting, you may take all medications, including your insulin, ?Do not worry if you miss your breakfast dose of insulin - start at your next meal. ? ?IF YOU THINK YOU MAY BE PREGNANT, OR ARE NURSING PLEASE INFORM THE TECHNOLOGIST. ? ?In preparation for your appointment, medication and supplies will be purchased.  Appointment availability is limited, so if you need to cancel or reschedule, please call the Radiology Department at [phone]  24 hours in advance to avoid a cancellation fee of $100.00 ? ?What to Expect After you Arrive: ? ?Once you arrive and check in for your appointment, you will be taken to a preparation room within the  Radiology Department.  A technologist or Nurse will obtain your medical history, verify that you are correctly prepped for the exam, and explain the procedure.  Afterwards,  an IV will be started in your arm and electrodes will be placed on your skin for EKG monitoring during the stress portion of the exam. Then you will be escorted to the PET/CT scanner.  There, staff will get you positioned on the scanner and obtain a blood pressure and EKG.  During the exam, you will continue to be connected to the EKG and blood pressure machines.  A small, safe amount of a radioactive tracer will be injected in your IV to obtain a series of pictures of your heart along with an injection of a stress agent.   ? ?After your Exam: ? ?It is recommended that you eat a meal and drink a caffeinated beverage to counter act any effects of the stress agent.  Drink plenty of fluids for the remainder of the day and urinate frequently for the first couple of hours after the exam.  Your doctor will inform you of your test results within 7-10 business days. ? ?For questions about your test or how to prepare for your test, please call: ?Marchia Bond, Cardiac Imaging Nurse Navigator  ?Gordy Clement, Cardiac Imaging Nurse Navigator ?Office: [phone] ? ? ?Follow-Up: ?At [facility], you and your health needs are our priority.  As part of our continuing mission to provide you with exceptional heart care, we have created designated Provider Care Teams.  These Care Teams include your primary  Cardiologist (physician) and Advanced Practice Providers (APPs -  Physician Assistants and Nurse Practitioners) who all work together to provide you with the care you need, when you need it. ? ?We recommend signing up for the patient portal called "MyChart".  Sign up information is provided on this After Visit Summary.  MyChart is used to connect with patients for Virtual Visits (Telemedicine).  Patients are able to view lab/test results, encounter notes,  upcoming appointments, etc.  Non-urgent messages can be sent to your provider as well.   ?To learn more about what you can do with MyChart, go to [email or web address].   ? ?Your next appointment:   ?6 month(s) ? ?The format for your next appointment:   ?In Person ? ?Provider:   ?Dr. Gardiner Rhyme ? ? ?Important Information About Sugar ? ? ? ? ? ? ?

## 2021-11-23 ENCOUNTER — Inpatient Hospital Stay (HOSPITAL_COMMUNITY): Admission: RE | Admit: 2021-11-23 | Payer: BC Managed Care – PPO | Source: Ambulatory Visit

## 2021-11-29 ENCOUNTER — Telehealth (HOSPITAL_COMMUNITY): Payer: Self-pay | Admitting: *Deleted

## 2021-11-29 NOTE — Telephone Encounter (Signed)
Attempted to call patient regarding upcoming cardiac PET appointment. Left message on voicemail with name and callback number  Natash Berman RN Navigator Cardiac Imaging [facility] [phone] Office [phone] Cell  

## 2021-11-30 ENCOUNTER — Encounter (HOSPITAL_COMMUNITY)
Admission: RE | Admit: 2021-11-30 | Discharge: 2021-11-30 | Disposition: A | Discharge status: Home / Self Care | Payer: BC Managed Care – PPO | Source: Ambulatory Visit | Attending: Cardiology | Admitting: Cardiology

## 2021-11-30 DIAGNOSIS — R9439 Abnormal result of other cardiovascular function study: Secondary | ICD-10-CM | POA: Diagnosis present

## 2021-11-30 DIAGNOSIS — Z01810 Encounter for preprocedural cardiovascular examination: Secondary | ICD-10-CM | POA: Insufficient documentation

## 2021-11-30 LAB — NM PET CT CARDIAC PERFUSION MULTI W/ABSOLUTE BLOODFLOW
LV dias vol: 104 mL (ref 62–150)
LV sys vol: 32 mL
MBFR: 2.25
Nuc Rest EF: 59 %
Nuc Stress EF: 69 %
Rest MBF: 0.84 ml/g/min
Rest perfusion cavity size (mL): 57 mL
ST Depression (mm): 0 mm
Stress MBF: 1.89 ml/g/min
Stress perfusion cavity size (mL): 54 mL
TID: 0.95

## 2021-11-30 MED ORDER — RUBIDIUM RB82 GENERATOR (RUBYFILL)
20.0400 | PACK | Freq: Once | INTRAVENOUS | Status: AC
  Administered 2021-11-30: 20.04 via INTRAVENOUS

## 2021-11-30 MED ORDER — REGADENOSON 0.4 MG/5ML IV SOLN
INTRAVENOUS | Status: AC
  Filled 2021-11-30: qty 5

## 2021-11-30 MED ORDER — RUBIDIUM RB82 GENERATOR (RUBYFILL)
19.9800 | PACK | Freq: Once | INTRAVENOUS | Status: AC
Start: 2021-11-30 — End: 2021-11-30
  Administered 2021-11-30: 19.98 via INTRAVENOUS

## 2021-12-02 ENCOUNTER — Other Ambulatory Visit: Payer: Self-pay | Admitting: *Deleted

## 2021-12-02 DIAGNOSIS — Z13220 Encounter for screening for lipoid disorders: Secondary | ICD-10-CM

## 2021-12-02 DIAGNOSIS — I2510 Atherosclerotic heart disease of native coronary artery without angina pectoris: Secondary | ICD-10-CM

## 2022-01-12 ENCOUNTER — Telehealth: Payer: Self-pay | Admitting: Cardiology

## 2022-01-12 DIAGNOSIS — Z13220 Encounter for screening for lipoid disorders: Secondary | ICD-10-CM

## 2022-01-12 DIAGNOSIS — I2510 Atherosclerotic heart disease of native coronary artery without angina pectoris: Secondary | ICD-10-CM

## 2022-01-12 DIAGNOSIS — E1169 Type 2 diabetes mellitus with other specified complication: Secondary | ICD-10-CM

## 2022-01-12 NOTE — Telephone Encounter (Signed)
Spoke to patient cardiac pet scan results given.He will have Lipid panel next Tue 8/1 or he will have done with kidney Dr.Lipid panel order placed.

## 2022-01-12 NOTE — Telephone Encounter (Signed)
Pt returning a call about results. He states he is hard to get in touch with but a letter or a brief detailed message on VM will be okay.

## 2022-03-31 ENCOUNTER — Other Ambulatory Visit: Payer: Self-pay | Admitting: Gastroenterology

## 2022-04-04 ENCOUNTER — Encounter (HOSPITAL_COMMUNITY): Payer: Self-pay | Admitting: Gastroenterology

## 2022-04-12 ENCOUNTER — Ambulatory Visit (HOSPITAL_COMMUNITY)
Admission: RE | Admit: 2022-04-12 | Payer: BC Managed Care – PPO | Source: Home / Self Care | Admitting: Gastroenterology

## 2022-04-12 ENCOUNTER — Encounter (HOSPITAL_COMMUNITY): Admission: RE | Payer: Self-pay | Source: Home / Self Care

## 2022-04-12 SURGERY — COLONOSCOPY WITH PROPOFOL
Anesthesia: Monitor Anesthesia Care

## 2022-04-28 ENCOUNTER — Ambulatory Visit: Payer: BC Managed Care – PPO | Admitting: Cardiology

## 2022-05-20 ENCOUNTER — Other Ambulatory Visit: Payer: Self-pay | Admitting: Gastroenterology
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
## 2022-05-24 ENCOUNTER — Ambulatory Visit (HOSPITAL_COMMUNITY): Payer: BC Managed Care – PPO | Admitting: Certified Registered Nurse Anesthetist

## 2022-05-24 ENCOUNTER — Encounter (HOSPITAL_COMMUNITY): Payer: Self-pay | Admitting: Gastroenterology

## 2022-05-24 ENCOUNTER — Other Ambulatory Visit: Payer: Self-pay

## 2022-05-24 ENCOUNTER — Ambulatory Visit (HOSPITAL_COMMUNITY)
Admission: RE | Admit: 2022-05-24 | Discharge: 2022-05-24 | Disposition: A | Discharge status: Home / Self Care | Payer: BC Managed Care – PPO | Attending: Gastroenterology | Admitting: Gastroenterology

## 2022-05-24 ENCOUNTER — Encounter (HOSPITAL_COMMUNITY)
Admission: RE | Disposition: A | Discharge status: Home / Self Care | Payer: Self-pay | Source: Home / Self Care | Attending: Gastroenterology

## 2022-05-24 DIAGNOSIS — D124 Benign neoplasm of descending colon: Secondary | ICD-10-CM | POA: Insufficient documentation

## 2022-05-24 DIAGNOSIS — Z8614 Personal history of Methicillin resistant Staphylococcus aureus infection: Secondary | ICD-10-CM | POA: Diagnosis not present

## 2022-05-24 DIAGNOSIS — N185 Chronic kidney disease, stage 5: Secondary | ICD-10-CM | POA: Diagnosis not present

## 2022-05-24 DIAGNOSIS — K648 Other hemorrhoids: Secondary | ICD-10-CM | POA: Diagnosis not present

## 2022-05-24 DIAGNOSIS — I129 Hypertensive chronic kidney disease with stage 1 through stage 4 chronic kidney disease, or unspecified chronic kidney disease: Secondary | ICD-10-CM | POA: Diagnosis not present

## 2022-05-24 DIAGNOSIS — E1122 Type 2 diabetes mellitus with diabetic chronic kidney disease: Secondary | ICD-10-CM | POA: Insufficient documentation

## 2022-05-24 DIAGNOSIS — Z1211 Encounter for screening for malignant neoplasm of colon: Secondary | ICD-10-CM | POA: Diagnosis present

## 2022-05-24 DIAGNOSIS — K5730 Diverticulosis of large intestine without perforation or abscess without bleeding: Secondary | ICD-10-CM | POA: Insufficient documentation

## 2022-05-24 DIAGNOSIS — N2581 Secondary hyperparathyroidism of renal origin: Secondary | ICD-10-CM | POA: Diagnosis not present

## 2022-05-24 DIAGNOSIS — D122 Benign neoplasm of ascending colon: Secondary | ICD-10-CM | POA: Diagnosis not present

## 2022-05-24 DIAGNOSIS — E1151 Type 2 diabetes mellitus with diabetic peripheral angiopathy without gangrene: Secondary | ICD-10-CM | POA: Diagnosis not present

## 2022-05-24 DIAGNOSIS — D123 Benign neoplasm of transverse colon: Secondary | ICD-10-CM | POA: Diagnosis not present

## 2022-05-24 HISTORY — PX: COLONOSCOPY WITH PROPOFOL: SHX5780

## 2022-05-24 HISTORY — PX: POLYPECTOMY: SHX5525

## 2022-05-24 LAB — POCT I-STAT, CHEM 8
BUN: 68 mg/dL — ABNORMAL HIGH (ref 8–23)
Calcium, Ion: 1.1 mmol/L — ABNORMAL LOW (ref 1.15–1.40)
Chloride: 93 mmol/L — ABNORMAL LOW (ref 98–111)
Creatinine, Ser: 5.4 mg/dL — ABNORMAL HIGH (ref 0.61–1.24)
Glucose, Bld: 98 mg/dL (ref 70–99)
HCT: 32 % — ABNORMAL LOW (ref 39.0–52.0)
Hemoglobin: 10.9 g/dL — ABNORMAL LOW (ref 13.0–17.0)
Potassium: 4.2 mmol/L (ref 3.5–5.1)
Sodium: 128 mmol/L — ABNORMAL LOW (ref 135–145)
TCO2: 25 mmol/L (ref 22–32)

## 2022-05-24 SURGERY — COLONOSCOPY WITH PROPOFOL
Anesthesia: Monitor Anesthesia Care

## 2022-05-24 MED ORDER — PROPOFOL 10 MG/ML IV BOLUS
INTRAVENOUS | Status: DC | PRN
  Administered 2022-05-24: 50 mg via INTRAVENOUS
  Administered 2022-05-24: 25 mg via INTRAVENOUS

## 2022-05-24 MED ORDER — EPHEDRINE SULFATE-NACL 50-0.9 MG/10ML-% IV SOSY
PREFILLED_SYRINGE | INTRAVENOUS | Status: DC | PRN
  Administered 2022-05-24: 5 mg via INTRAVENOUS

## 2022-05-24 MED ORDER — PROPOFOL 1000 MG/100ML IV EMUL
INTRAVENOUS | Status: AC
  Filled 2022-05-24: qty 100

## 2022-05-24 MED ORDER — SODIUM CHLORIDE 0.9 % IV SOLN
INTRAVENOUS | Status: DC
  Administered 2022-05-24: 500 mL via INTRAVENOUS

## 2022-05-24 MED ORDER — ONDANSETRON HCL 4 MG/2ML IJ SOLN
INTRAMUSCULAR | Status: DC | PRN
  Administered 2022-05-24: 4 mg via INTRAVENOUS

## 2022-05-24 MED ORDER — PROPOFOL 500 MG/50ML IV EMUL
INTRAVENOUS | Status: DC | PRN
  Administered 2022-05-24: 150 ug/kg/min via INTRAVENOUS

## 2022-05-24 MED ORDER — LIDOCAINE 2% (20 MG/ML) 5 ML SYRINGE
INTRAMUSCULAR | Status: DC | PRN
  Administered 2022-05-24 (×2): 50 mg via INTRAVENOUS

## 2022-05-24 SURGICAL SUPPLY — 22 items

## 2022-05-24 NOTE — Transfer of Care (Signed)
Immediate Anesthesia Transfer of Care Note  Patient: Peter [name]  Procedure(s) Performed: Procedure(s): COLONOSCOPY WITH PROPOFOL (N/A)  Patient Location: PACU  Anesthesia Type:MAC  Level of Consciousness: Patient easily awoken, sedated, comfortable, cooperative, following commands, responds to stimulation.   Airway & Oxygen Therapy: Patient spontaneously breathing, ventilating well, oxygen via simple oxygen mask.  Post-op Assessment: Report given to PACU RN, vital signs reviewed and stable, moving all extremities.   Post vital signs: Reviewed and stable.  Complications: No apparent anesthesia complications Last Vitals:  Vitals Value Taken Time  BP 126/61 05/24/22 0920  Temp    Pulse 57 05/24/22 0921  Resp 12 05/24/22 0921  SpO2 100 % 05/24/22 0921  Vitals shown include unvalidated device data.  Last Pain:  Vitals:   05/24/22 0825  TempSrc: Tympanic  PainSc: 0-No pain         Complications: No notable events documented.

## 2022-05-24 NOTE — Op Note (Signed)
[facility] Patient Name: Peter [name] Procedure Date: 05/24/2022 MRN: [id number] Attending MD: Ronnette Juniper , MD, [id number] Date of Birth: April 04, 1961 CSN: [id number] Age: 61 Admit Type: Outpatient Procedure:                Colonoscopy Indications:              Screening for colorectal malignant neoplasm, This                            is the patient's first colonoscopy Providers:                Ronnette Juniper, MD, Dulcy Fanny, Janee Morn,                            Technician Referring MD:             Augustin Coupe Medicines:                Monitored Anesthesia Care Complications:            No immediate complications. Estimated blood loss:                            Minimal. Estimated Blood Loss:     Estimated blood loss was minimal. Procedure:                Pre-Anesthesia Assessment:                           - Prior to the procedure, a History and Physical                            was performed, and patient medications and                            allergies were reviewed. The patient's tolerance of                            previous anesthesia was also reviewed. The risks                            and benefits of the procedure and the sedation                            options and risks were discussed with the patient.                            All questions were answered, and informed consent                            was obtained. Prior Anticoagulants: The patient has                            taken no anticoagulant or antiplatelet agents. ASA                            Grade Assessment: III -  A patient with severe                            systemic disease. After reviewing the risks and                            benefits, the patient was deemed in satisfactory                            condition to undergo the procedure.                           After obtaining informed consent, the colonoscope                            was passed  under direct vision. Throughout the                            procedure, the patient's blood pressure, pulse, and                            oxygen saturations were monitored continuously. The                            PCF-HQ190L ([id number]) Olympus colonoscope was                            introduced through the anus and advanced to the the                            terminal ileum. The colonoscopy was performed                            without difficulty. The patient tolerated the                            procedure well. The quality of the bowel                            preparation was adequate to identify polyps greater                            than 5 mm in size. The terminal ileum, ileocecal                            valve, appendiceal orifice, and rectum were                            photographed. Scope In: 8:52:50 AM Scope Out: 9:15:39 AM Scope Withdrawal Time: 0 hours 9 minutes 45 seconds  Total Procedure Duration: 0 hours 22 minutes 49 seconds  Findings:      The perianal and digital rectal examinations were normal.      The terminal ileum appeared normal.      Two sessile polyps were found in  the descending colon. The polyps were 3       to 4 mm in size. These polyps were removed with a piecemeal technique       using a cold biopsy forceps. Resection and retrieval were complete.      Three sessile polyps were found in the transverse colon and ascending       colon. The polyps were 4 to 8 mm in size. These polyps were removed with       a piecemeal technique using a hot snare and with a cold biopsy forceps.       Resection and retrieval were complete.      Scattered medium-mouthed and small-mouthed diverticula were found in the       sigmoid colon, transverse colon and cecum.      Non-bleeding internal hemorrhoids were found during retroflexion. Impression:               - The examined portion of the ileum was normal.                           - Two 3 to 4 mm polyps  in the descending colon,                            removed piecemeal using a cold biopsy forceps.                            Resected and retrieved.                           - Three 4 to 8 mm polyps in the transverse colon                            and in the ascending colon, removed piecemeal using                            a hot snare and with a cold biopsy forceps.                            Resected and retrieved.                           - Diverticulosis in the sigmoid colon, in the                            transverse colon and in the cecum.                           - Non-bleeding internal hemorrhoids. Moderate Sedation:      Patient did not receive moderate sedation for this procedure, but       instead received monitored anesthesia care. Recommendation:           - Patient has a contact number available for                            emergencies. The signs and symptoms of potential  delayed complications were discussed with the                            patient. Return to normal activities tomorrow.                            Written discharge instructions were provided to the                            patient.                           - High fiber diet.                           - Continue present medications.                           - Await pathology results.                           - Repeat colonoscopy for surveillance based on                            pathology results. Procedure Code(s):        --- Professional ---                           [phone], Colonoscopy, flexible; with removal of                            tumor(s), polyp(s), or other lesion(s) by snare                            technique                           45380, 14, Colonoscopy, flexible; with biopsy,                            single or multiple Diagnosis Code(s):        --- Professional ---                           Z12.11, Encounter for screening for malignant                             neoplasm of colon                           K64.8, Other hemorrhoids                           D12.4, Benign neoplasm of descending colon                           D12.3, Benign neoplasm of transverse colon (hepatic  flexure or splenic flexure)                           D12.2, Benign neoplasm of ascending colon                           K57.30, Diverticulosis of large intestine without                            perforation or abscess without bleeding CPT copyright 2022 American [employer] Association. All rights reserved. The codes documented in this report are preliminary and upon coder review may  be revised to meet current compliance requirements. Ronnette Juniper, MD 05/24/2022 9:20:53 AM This report has been signed electronically. Number of Addenda: 0

## 2022-05-24 NOTE — Discharge Instructions (Signed)
YOU HAD AN ENDOSCOPIC PROCEDURE TODAY: Refer to the procedure report and other information in the discharge instructions given to you for any specific questions about what was found during the examination. If this information does not answer your questions, please call [facility] office at [phone] to clarify.   YOU SHOULD EXPECT: Some feelings of bloating in the abdomen. Passage of more gas than usual. Walking can help get rid of the air that was put into your GI tract during the procedure and reduce the bloating. If you had a lower endoscopy (such as a colonoscopy or flexible sigmoidoscopy) you may notice spotting of blood in your stool or on the toilet paper. Some abdominal soreness may be present for a day or two, also.  DIET: Your first meal following the procedure should be a light meal and then it is ok to progress to your normal diet. A half-sandwich or bowl of soup is an example of a good first meal. Heavy or fried foods are harder to digest and may make you feel nauseous or bloated. Drink plenty of fluids but you should avoid alcoholic beverages for 24 hours. If you had a esophageal dilation, please see attached instructions for diet.    ACTIVITY: Your care partner should take you home directly after the procedure. You should plan to take it easy, moving slowly for the rest of the day. You can resume normal activity the day after the procedure however YOU SHOULD NOT DRIVE, use power tools, machinery or perform tasks that involve climbing or major physical exertion for 24 hours (because of the sedation medicines used during the test).   SYMPTOMS TO REPORT IMMEDIATELY: A gastroenterologist can be reached at any hour. Please call [phone]  for any of the following symptoms:  Following lower endoscopy (colonoscopy, flexible sigmoidoscopy) Excessive amounts of blood in the stool  Significant tenderness, worsening of abdominal pains  Swelling of the abdomen that is new, acute  Fever of 100  or higher   FOLLOW UP:  If any biopsies were taken you will be contacted by phone or by letter within the next 1-3 weeks. Call [phone]  if you have not heard about the biopsies in 3 weeks.  Please also call with any specific questions about appointments or follow up tests. YOU HAD AN ENDOSCOPIC PROCEDURE TODAY: Refer to the procedure report and other information in the discharge instructions given to you for any specific questions about what was found during the examination. If this information does not answer your questions, please call [facility] office at [phone] to clarify.

## 2022-05-24 NOTE — Anesthesia Procedure Notes (Signed)
Procedure Name: MAC Date/Time: 05/24/2022 8:54 AM  Performed by: Deliah Boston, CRNAPre-anesthesia Checklist: Patient identified, Emergency Drugs available, Suction available and Patient being monitored Patient Re-evaluated:Patient Re-evaluated prior to induction Oxygen Delivery Method: Simple face mask Preoxygenation: Pre-oxygenation with 100% oxygen Induction Type: IV induction Placement Confirmation: positive ETCO2 and breath sounds checked- equal and bilateral Dental Injury: Teeth and Oropharynx as per pre-operative assessment

## 2022-05-24 NOTE — Anesthesia Postprocedure Evaluation (Signed)
Anesthesia Post Note  Patient: Ediel Unangst  Procedure(s) Performed: COLONOSCOPY WITH PROPOFOL     Patient location during evaluation: PACU Anesthesia Type: MAC Level of consciousness: awake and alert Pain management: pain level controlled Vital Signs Assessment: post-procedure vital signs reviewed and stable Respiratory status: spontaneous breathing, nonlabored ventilation, respiratory function stable and patient connected to nasal cannula oxygen Cardiovascular status: stable and blood pressure returned to baseline Postop Assessment: no apparent nausea or vomiting Anesthetic complications: no  No notable events documented.  Last Vitals:  Vitals:   05/24/22 0825 05/24/22 0920  BP: (!) 179/93 126/61  Pulse: 67 (!) 58  Resp: 15 (!) 8  Temp: 36.6 C   SpO2: 100% 100%    Last Pain:  Vitals:   05/24/22 0920  TempSrc:   PainSc: Asleep                 Nadirah Socorro S

## 2022-05-24 NOTE — Anesthesia Preprocedure Evaluation (Signed)
Anesthesia Evaluation  Patient identified by MRN, date of birth, ID band Patient awake    Reviewed: Allergy & Precautions, H&P , NPO status , Patient's Chart, lab work & pertinent test results  Airway Mallampati: II  TM Distance: >3 FB Neck ROM: Full    Dental no notable dental hx.    Pulmonary neg pulmonary ROS   Pulmonary exam normal breath sounds clear to auscultation       Cardiovascular hypertension, + Peripheral Vascular Disease  Normal cardiovascular exam+ Valvular Problems/Murmurs AI  Rhythm:Regular Rate:Normal  Bicuspid aortic valve   Neuro/Psych negative neurological ROS  negative psych ROS   GI/Hepatic negative GI ROS, Neg liver ROS,,,  Endo/Other  diabetes, Type 2    Renal/GU Renal InsufficiencyRenal disease  negative genitourinary   Musculoskeletal negative musculoskeletal ROS (+)    Abdominal   Peds negative pediatric ROS (+)  Hematology negative hematology ROS (+)   Anesthesia Other Findings   Reproductive/Obstetrics negative OB ROS                             Anesthesia Physical Anesthesia Plan  ASA: 3  Anesthesia Plan: MAC   Post-op Pain Management: Minimal or no pain anticipated   Induction: Intravenous  PONV Risk Score and Plan: 1 and Propofol infusion and Treatment may vary due to age or medical condition  Airway Management Planned: Simple Face Mask  Additional Equipment:   Intra-op Plan:   Post-operative Plan:   Informed Consent: I have reviewed the patients History and Physical, chart, labs and discussed the procedure including the risks, benefits and alternatives for the proposed anesthesia with the patient or authorized representative who has indicated his/her understanding and acceptance.     Dental advisory given  Plan Discussed with: CRNA and Surgeon  Anesthesia Plan Comments:        Anesthesia Quick Evaluation

## 2022-05-25 LAB — SURGICAL PATHOLOGY

## 2022-05-26 ENCOUNTER — Encounter (HOSPITAL_COMMUNITY): Payer: Self-pay | Admitting: Gastroenterology

## 2022-07-04 ENCOUNTER — Ambulatory Visit: Payer: BC Managed Care – PPO | Admitting: Cardiology

## 2022-10-14 ENCOUNTER — Other Ambulatory Visit: Payer: Self-pay | Admitting: Cardiology

## 2022-10-14 ENCOUNTER — Other Ambulatory Visit: Payer: Self-pay

## 2022-10-14 MED ORDER — CARVEDILOL 12.5 MG PO TABS: 12.5000 mg | ORAL_TABLET | Freq: Two times a day (BID) | ORAL | 0 refills | Status: DC

## 2022-10-14 MED ORDER — AMLODIPINE BESYLATE 5 MG PO TABS: 5.0000 mg | ORAL_TABLET | Freq: Two times a day (BID) | ORAL | 0 refills | Status: DC

## 2022-10-18 ENCOUNTER — Other Ambulatory Visit: Payer: Self-pay | Admitting: Cardiology

## 2022-11-21 ENCOUNTER — Other Ambulatory Visit: Payer: Self-pay | Admitting: Cardiology

## 2022-12-22 ENCOUNTER — Other Ambulatory Visit: Payer: Self-pay | Admitting: Cardiology

## 2023-05-06 ENCOUNTER — Other Ambulatory Visit: Payer: Self-pay

## 2023-05-06 ENCOUNTER — Encounter (HOSPITAL_COMMUNITY): Payer: Self-pay | Admitting: Emergency Medicine

## 2023-05-06 ENCOUNTER — Emergency Department (HOSPITAL_COMMUNITY)
Admission: EM | Admit: 2023-05-06 | Discharge: 2023-05-06 | Disposition: A | Discharge status: Home / Self Care | Payer: BC Managed Care – PPO | Attending: Emergency Medicine | Admitting: Emergency Medicine

## 2023-05-06 ENCOUNTER — Emergency Department (HOSPITAL_COMMUNITY): Payer: BC Managed Care – PPO

## 2023-05-06 DIAGNOSIS — R0789 Other chest pain: Secondary | ICD-10-CM | POA: Insufficient documentation

## 2023-05-06 DIAGNOSIS — N186 End stage renal disease: Secondary | ICD-10-CM | POA: Insufficient documentation

## 2023-05-06 LAB — CBC
HCT: 34.6 % — ABNORMAL LOW (ref 39.0–52.0)
Hemoglobin: 11.6 g/dL — ABNORMAL LOW (ref 13.0–17.0)
MCH: 29.5 pg (ref 26.0–34.0)
MCHC: 33.5 g/dL (ref 30.0–36.0)
MCV: 88 fL (ref 80.0–100.0)
Platelets: 206 10*3/uL (ref 150–400)
RBC: 3.93 MIL/uL — ABNORMAL LOW (ref 4.22–5.81)
RDW: 12.2 % (ref 11.5–15.5)
WBC: 6.4 10*3/uL (ref 4.0–10.5)
nRBC: 0 % (ref 0.0–0.2)

## 2023-05-06 LAB — BLOOD GAS, VENOUS
Acid-Base Excess: 2.4 mmol/L — ABNORMAL HIGH (ref 0.0–2.0)
Bicarbonate: 26.4 mmol/L (ref 20.0–28.0)
O2 Saturation: 96.7 %
Patient temperature: 37
pCO2, Ven: 38 mm[Hg] — ABNORMAL LOW (ref 44–60)
pH, Ven: 7.45 — ABNORMAL HIGH (ref 7.25–7.43)
pO2, Ven: 77 mm[Hg] — ABNORMAL HIGH (ref 32–45)

## 2023-05-06 LAB — BASIC METABOLIC PANEL
Anion gap: 16 — ABNORMAL HIGH (ref 5–15)
BUN: 69 mg/dL — ABNORMAL HIGH (ref 8–23)
CO2: 23 mmol/L (ref 22–32)
Calcium: 9.2 mg/dL (ref 8.9–10.3)
Chloride: 100 mmol/L (ref 98–111)
Creatinine, Ser: 4.83 mg/dL — ABNORMAL HIGH (ref 0.61–1.24)
GFR, Estimated: 13 mL/min — ABNORMAL LOW (ref >60)
Glucose, Bld: 316 mg/dL — ABNORMAL HIGH (ref 70–99)
Potassium: 3.9 mmol/L (ref 3.5–5.1)
Sodium: 139 mmol/L (ref 135–145)

## 2023-05-06 LAB — TROPONIN I (HIGH SENSITIVITY)
Troponin I (High Sensitivity): 8 ng/L (ref <18)
Troponin I (High Sensitivity): 8 ng/L (ref <18)

## 2023-05-06 NOTE — ED Triage Notes (Signed)
Pt here for centralized CP that started 2 days ago, reports nausea, vomiting and hot flashes that started today. Denies ShOB.

## 2023-05-06 NOTE — ED Notes (Signed)
Patient made aware of need for urine sample.  Provide with water per PA request

## 2023-05-06 NOTE — ED Notes (Signed)
Pt returned to triage from X-ray

## 2023-05-06 NOTE — ED Notes (Signed)
Patient transported to X-ray 

## 2023-05-06 NOTE — ED Provider Notes (Signed)
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  Pain is localized substernal does not radiate.  States symptoms returned suddenly earlier today and he had multiple episodes of nonbloody emesis.  He feels that it was worse with sitting up, however there is no exertional component.  No relation to food.  Denies any associated shortness of breath, dizziness.  He additionally denies any abdominal pain or diarrhea.  No congestion, no one sick at home.  No fevers or chills.  Denies any prior cardiac history.  No recent increased leg swelling.   Chest Pain      Home Medications Prior to Admission medications   Medication Sig Start Date End Date Taking? Authorizing Provider  amLODipine (NORVASC) 5 MG tablet TAKE 1 TABLET BY MOUTH TWICE A DAY 12/23/22  Yes Little Ishikawa, MD  atorvastatin (LIPITOR) 40 MG tablet Take 40 mg by mouth at bedtime.   Yes [provider]  calcitRIOL (ROCALTROL) 0.5 MCG capsule Take 0.5 mcg by mouth 3 (three) times a week. Every other day   Yes [provider]  carvedilol (COREG) 12.5 MG tablet TAKE 1 TABLET (12.5 MG TOTAL) BY MOUTH IN THE MORNING AND AT BEDTIME. 11/22/22  Yes Kathleene Hazel, MD  chlorthalidone (HYGROTON) 25 MG tablet Take 1 tablet (25 mg total) by mouth daily. 02/12/20  Yes Noralee Stain, DO  glimepiride (AMARYL) 2 MG tablet Take 1 tablet (2 mg total) by mouth daily with breakfast. 07/07/17  Yes Ranelle Oyster, MD  sodium bicarbonate 650 MG tablet Take 1,300 mg by mouth 2 (two) times daily. 03/10/20  Yes [provider]      Allergies    Patient has no known allergies.    Review of Systems   Review of Systems  Cardiovascular:  Positive for chest pain.     Physical Exam Updated Vital Signs BP 136/70   Pulse 86   Temp 97.7 F (36.5 C)   Resp 15   Ht 5\' 8"  (1.727 m)   Wt 74.8 kg   SpO2 97%   BMI 25.09 kg/m  Physical Exam Vitals and nursing note reviewed.  Constitutional:      General: He is not in acute distress.    Appearance: He is well-developed.  HENT:     Head: Normocephalic and atraumatic.  Eyes:     Conjunctiva/sclera: Conjunctivae normal.  Cardiovascular:     Rate and Rhythm: Normal rate and regular rhythm.     Heart sounds: No murmur heard. Pulmonary:     Effort: Pulmonary effort is normal. No respiratory distress.     Breath sounds: Normal breath sounds.  Abdominal:     Palpations: Abdomen is soft.     Tenderness: There is no abdominal tenderness.  Musculoskeletal:        General: No swelling.     Cervical back: Neck supple.  Skin:    General: Skin is warm and dry.     Capillary Refill: Capillary refill takes less than 2 seconds.  Neurological:     Mental Status: He is alert.  Psychiatric:        Mood and Affect: Mood normal.     ED Results / Procedures / Treatments  Labs (all labs ordered are listed, but only abnormal results are displayed) Labs Reviewed  BASIC METABOLIC PANEL - Abnormal; Notable for the following components:      Result Value   Glucose, Bld 316 (*)    BUN 69 (*)    Creatinine, Ser 4.83 (*)    GFR, Estimated 13 (*)    Anion gap 16 (*)    All other components within normal limits  CBC - Abnormal; Notable for the following components:   RBC 3.93 (*)    Hemoglobin 11.6 (*)    HCT 34.6 (*)    All other components within normal limits  BLOOD GAS, VENOUS - Abnormal; Notable for the following components:   pH, Ven 7.45 (*)    pCO2, Ven 38 (*)    pO2, Ven 77 (*)    Acid-Base Excess 2.4 (*)    All other components within normal limits  URINALYSIS, ROUTINE W REFLEX MICROSCOPIC  TROPONIN I (HIGH SENSITIVITY)  TROPONIN I (HIGH SENSITIVITY)    EKG EKG  Interpretation Date/Time:  Saturday May 06 2023 19:14:09 EST Ventricular Rate:  83 PR Interval:  166 QRS Duration:  94 QT Interval:  364 QTC Calculation: 427 R Axis:   -62  Text Interpretation: Normal sinus rhythm Left anterior fascicular block Moderate voltage criteria for LVH, may be normal variant ( R in aVL , Cornell product ) Cannot rule out Inferior infarct (masked by fascicular block?) , age undetermined Abnormal ECG No significant change since last tracing Confirmed by Fulton Reek [phone]) on 05/06/2023 10:21:27 PM  Radiology DG Chest 2 View  Result Date: 05/06/2023 CLINICAL DATA:  Chest pain. EXAM: CHEST - 2 VIEW COMPARISON:  Portable chest 02/01/2020 FINDINGS: The cardiomediastinal silhouette and vascular pattern are normal. Slightly elevated left diaphragm again noted. There is a moderate side-to-side mid tracheal narrowing, potentially due to scarring from prior tracheostomy or intubation but nonspecific. Active tracheitis or infiltrating etiology not excluded. Follow-up as indicated. The lungs are clear. No pleural effusion is seen. There are multilevel healed fracture deformities of the lateral left ribs, as before. No acute osseous findings.  Embo coils medial left upper abdomen. IMPRESSION: 1. Moderate side-to-side mid tracheal narrowing, potentially due to scarring from prior tracheostomy or intubation but nonspecific. Active tracheitis or infiltrating etiology not excluded. Follow-up as indicated. 2. No other evidence of acute chest process. Electronically Signed   By: Almira Bar M.D.   On: 05/06/2023 20:19    Procedures Procedures    Medications Ordered in ED Medications - No data to display  ED Course/ Medical Decision Making/ A&P Clinical Course as of 05/06/23 2343  Sat May 06, 2023  2339 Troponin I (High Sensitivity): 8 [JT]  2340 Troponin I (High Sensitivity) [JT]    Clinical Course User Index [JT] Halford Decamp, PA-C                                  Medical Decision Making Amount and/or Complexity of Data Reviewed Labs: ordered. Radiology: ordered.   This patient presents to the ED with chief complaint(s) of chest pain with pertinent past medical history of ESRD.  The complaint involves an extensive differential diagnosis and also carries with it a high risk of complications and morbidity.    The differential diagnosis includes  ACS, AAA, PE, pneumonia, costochondritis, GERD The initial plan is to  Basic labs and chest x-ray  Additional history obtained: Additional history  obtained from family Records reviewed previous admission documents  Initial Assessment:   Patient appears to be sitting comfortably.  Hemodynamically stable.  In no acute distress.  Exam is reassuring, cardiac history.  Independent ECG/labs interpretation:  The following labs were independently interpreted:  ECG NSR without ischemic changes  CBC without leukocytosis, BMP notable for glucose of 316 and anion gap of 16, creatinine 4.3 in the setting of ESRD; at baseline.  Independent visualization and interpretation of imaging: I independently visualized the following imaging with scope of interpretation limited to determining acute life threatening conditions related to emergency care: Chest x-ray, which revealed some narrowing of esophagus otherwise no cardiopulmonary disease  Of note narrowing of the esophagus can represent scarring from prior tracheostomy or intubation.  When I discussed this with the patient he confirms that he has a history of intubation and trach  Treatment and Reassessment: 2200 Upon reassessment patient continues to deny any pain or nausea at this time.  He does appear to be hyperglycemic without anion gap, VBG and urine added on to further evaluate for DKA  2300 no evidence of acidosis on VBG. Low suspicion for DKA  Consultations obtained:   None  Disposition:   Discharge home with close cardiology follow up. The patient  has been appropriately medically screened and/or stabilized in the ED. I have low suspicion for any other emergent medical condition which would require further screening, evaluation or treatment in the ED or require inpatient management. At time of discharge the patient is hemodynamically stable and in no acute distress. I have discussed work-up results and diagnosis with patient and answered all questions. Patient is agreeable with discharge plan. We discussed strict return precautions for returning to the emergency department and they verbalized understanding.     Social Determinants of Health:   None  This note was dictated with voice recognition software.  Despite best efforts at proofreading, errors may have occurred which can change the documentation meaning.         Final Clinical Impression(s) / ED Diagnoses Final diagnoses:  Atypical chest pain    Rx / DC Orders ED Discharge Orders          Ordered    Ambulatory referral to Cardiology       Comments: If you have not heard from the Cardiology office within the next 72 hours please call [phone].   05/06/23 2340              Halford Decamp, PA-C 05/06/23 2343    Laurence Spates, MD 05/07/23 [phone]

## 2023-05-06 NOTE — Discharge Instructions (Addendum)
It was a pleasure taking care of you this evening.  You were evaluated in the emergency department for chest pain.  Lab work and a chest x-ray showed no acute abnormality. Please make a cardiology appointment early next week to discuss stress test.  If you experience any new or worsening symptoms including shortness of breath, worsening chest pain, or dizziness please return to the emergency room.

## 2024-06-03 ENCOUNTER — Encounter (HOSPITAL_COMMUNITY): Payer: Self-pay

## 2024-07-02 ENCOUNTER — Encounter (HOSPITAL_COMMUNITY): Payer: Self-pay
